# Patient Record
Sex: Male | Born: 1985 | Race: White | Hispanic: No | Marital: Married | Smoking: Current some day smoker
Health system: Southern US, Community
[De-identification: ages and names within clinical notes are randomized; demographics above are authoritative.]

## PROBLEM LIST (undated history)

## (undated) DIAGNOSIS — M549 Dorsalgia, unspecified: Secondary | ICD-10-CM

## (undated) DIAGNOSIS — Z95811 Presence of heart assist device: Secondary | ICD-10-CM

## (undated) DIAGNOSIS — T7840XA Allergy, unspecified, initial encounter: Secondary | ICD-10-CM

## (undated) DIAGNOSIS — F909 Attention-deficit hyperactivity disorder, unspecified type: Secondary | ICD-10-CM

## (undated) DIAGNOSIS — K219 Gastro-esophageal reflux disease without esophagitis: Secondary | ICD-10-CM

## (undated) DIAGNOSIS — R569 Unspecified convulsions: Secondary | ICD-10-CM

## (undated) HISTORY — PX: WISDOM TOOTH EXTRACTION: SHX21

## (undated) HISTORY — DX: Allergy, unspecified, initial encounter: T78.40XA

## (undated) HISTORY — PX: CHOLECYSTECTOMY: SHX55

## (undated) HISTORY — DX: Dorsalgia, unspecified: M54.9

## (undated) HISTORY — DX: Attention-deficit hyperactivity disorder, unspecified type: F90.9

## (undated) HISTORY — DX: Gastro-esophageal reflux disease without esophagitis: K21.9

---
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
## 2008-01-29 ENCOUNTER — Emergency Department (HOSPITAL_COMMUNITY): Admission: EM | Admit: 2008-01-29 | Discharge: 2008-01-29 | Payer: Self-pay | Admitting: Emergency Medicine

## 2008-02-13 ENCOUNTER — Emergency Department (HOSPITAL_COMMUNITY): Admission: EM | Admit: 2008-02-13 | Discharge: 2008-02-13 | Payer: Self-pay | Admitting: Emergency Medicine

## 2008-02-26 ENCOUNTER — Emergency Department (HOSPITAL_COMMUNITY): Admission: EM | Admit: 2008-02-26 | Discharge: 2008-02-26 | Payer: Self-pay | Admitting: Emergency Medicine

## 2008-03-19 ENCOUNTER — Emergency Department (HOSPITAL_COMMUNITY): Admission: EM | Admit: 2008-03-19 | Discharge: 2008-03-19 | Payer: Self-pay | Admitting: Emergency Medicine

## 2008-06-14 ENCOUNTER — Emergency Department (HOSPITAL_COMMUNITY): Admission: EM | Admit: 2008-06-14 | Discharge: 2008-06-14 | Payer: Self-pay | Admitting: Family Medicine

## 2008-07-29 ENCOUNTER — Emergency Department (HOSPITAL_COMMUNITY): Admission: EM | Admit: 2008-07-29 | Discharge: 2008-07-29 | Payer: Self-pay | Admitting: Emergency Medicine

## 2008-09-26 ENCOUNTER — Ambulatory Visit: Payer: Self-pay | Admitting: Family Medicine

## 2008-09-26 DIAGNOSIS — F329 Major depressive disorder, single episode, unspecified: Secondary | ICD-10-CM

## 2008-09-26 DIAGNOSIS — J45909 Unspecified asthma, uncomplicated: Secondary | ICD-10-CM | POA: Insufficient documentation

## 2008-09-30 ENCOUNTER — Encounter (INDEPENDENT_AMBULATORY_CARE_PROVIDER_SITE_OTHER): Payer: Self-pay | Admitting: Family Medicine

## 2008-09-30 LAB — CONVERTED CEMR LAB

## 2008-11-22 ENCOUNTER — Ambulatory Visit: Payer: Self-pay | Admitting: *Deleted

## 2008-11-22 ENCOUNTER — Ambulatory Visit: Payer: Self-pay | Admitting: Family Medicine

## 2008-11-22 DIAGNOSIS — M7520 Bicipital tendinitis, unspecified shoulder: Secondary | ICD-10-CM | POA: Insufficient documentation

## 2008-12-20 ENCOUNTER — Emergency Department (HOSPITAL_COMMUNITY): Admission: EM | Admit: 2008-12-20 | Discharge: 2008-12-20 | Payer: Self-pay | Admitting: Family Medicine

## 2008-12-23 HISTORY — PX: CHOLECYSTECTOMY: SHX55

## 2009-02-07 ENCOUNTER — Ambulatory Visit: Payer: Self-pay | Admitting: Family Medicine

## 2009-03-09 ENCOUNTER — Inpatient Hospital Stay (HOSPITAL_COMMUNITY): Admission: EM | Admit: 2009-03-09 | Discharge: 2009-03-12 | Payer: Self-pay | Admitting: Emergency Medicine

## 2009-03-09 ENCOUNTER — Ambulatory Visit: Payer: Self-pay | Admitting: Gastroenterology

## 2009-03-11 ENCOUNTER — Encounter (INDEPENDENT_AMBULATORY_CARE_PROVIDER_SITE_OTHER): Payer: Self-pay | Admitting: General Surgery

## 2009-05-17 ENCOUNTER — Emergency Department (HOSPITAL_COMMUNITY): Admission: EM | Admit: 2009-05-17 | Discharge: 2009-05-17 | Payer: Self-pay | Admitting: Emergency Medicine

## 2009-06-01 ENCOUNTER — Emergency Department (HOSPITAL_COMMUNITY): Admission: EM | Admit: 2009-06-01 | Discharge: 2009-06-01 | Payer: Self-pay | Admitting: Emergency Medicine

## 2009-07-25 ENCOUNTER — Emergency Department (HOSPITAL_COMMUNITY): Admission: EM | Admit: 2009-07-25 | Discharge: 2009-07-25 | Payer: Self-pay | Admitting: Emergency Medicine

## 2009-09-01 ENCOUNTER — Emergency Department (HOSPITAL_COMMUNITY): Admission: EM | Admit: 2009-09-01 | Discharge: 2009-09-01 | Payer: Self-pay | Admitting: Emergency Medicine

## 2009-09-21 ENCOUNTER — Emergency Department (HOSPITAL_COMMUNITY): Admission: EM | Admit: 2009-09-21 | Discharge: 2009-09-21 | Payer: Self-pay | Admitting: Emergency Medicine

## 2009-10-03 ENCOUNTER — Emergency Department (HOSPITAL_COMMUNITY): Admission: EM | Admit: 2009-10-03 | Discharge: 2009-10-03 | Payer: Self-pay | Admitting: Family Medicine

## 2009-10-04 ENCOUNTER — Ambulatory Visit: Payer: Self-pay | Admitting: Physician Assistant

## 2009-10-04 LAB — CONVERTED CEMR LAB
AST: 12 units/L (ref 0–37)
Albumin: 4.9 g/dL (ref 3.5–5.2)
Alkaline Phosphatase: 71 units/L (ref 39–117)
Barbiturate Quant, Ur: NEGATIVE
Calcium: 9.1 mg/dL (ref 8.4–10.5)
Chloride: 102 meq/L (ref 96–112)
Creatinine,U: 210.3 mg/dL
Marijuana Metabolite: POSITIVE — AB
Methadone: NEGATIVE
Opiate Screen, Urine: NEGATIVE
Potassium: 4.2 meq/L (ref 3.5–5.3)
Propoxyphene: NEGATIVE
Sodium: 140 meq/L (ref 135–145)
Total Protein: 7 g/dL (ref 6.0–8.3)

## 2009-10-05 ENCOUNTER — Encounter: Payer: Self-pay | Admitting: Physician Assistant

## 2009-10-06 ENCOUNTER — Encounter (INDEPENDENT_AMBULATORY_CARE_PROVIDER_SITE_OTHER): Payer: Self-pay | Admitting: *Deleted

## 2009-10-13 ENCOUNTER — Telehealth: Payer: Self-pay | Admitting: Physician Assistant

## 2009-10-18 ENCOUNTER — Ambulatory Visit (HOSPITAL_COMMUNITY): Admission: RE | Admit: 2009-10-18 | Discharge: 2009-10-18 | Payer: Self-pay | Admitting: Physician Assistant

## 2009-10-24 ENCOUNTER — Telehealth: Payer: Self-pay | Admitting: Physician Assistant

## 2009-10-30 ENCOUNTER — Encounter: Payer: Self-pay | Admitting: Physician Assistant

## 2009-11-06 ENCOUNTER — Ambulatory Visit: Payer: Self-pay | Admitting: Physician Assistant

## 2009-11-06 ENCOUNTER — Encounter (INDEPENDENT_AMBULATORY_CARE_PROVIDER_SITE_OTHER): Payer: Self-pay | Admitting: *Deleted

## 2009-11-06 DIAGNOSIS — F411 Generalized anxiety disorder: Secondary | ICD-10-CM | POA: Insufficient documentation

## 2009-11-27 ENCOUNTER — Ambulatory Visit: Payer: Self-pay | Admitting: Physician Assistant

## 2009-11-27 ENCOUNTER — Telehealth: Payer: Self-pay | Admitting: Physician Assistant

## 2010-01-04 ENCOUNTER — Emergency Department (HOSPITAL_COMMUNITY): Admission: EM | Admit: 2010-01-04 | Discharge: 2010-01-04 | Payer: Self-pay | Admitting: Emergency Medicine

## 2010-04-25 ENCOUNTER — Emergency Department (HOSPITAL_COMMUNITY): Admission: EM | Admit: 2010-04-25 | Discharge: 2010-04-25 | Payer: Self-pay | Admitting: Emergency Medicine

## 2010-07-12 ENCOUNTER — Emergency Department (HOSPITAL_COMMUNITY): Admission: EM | Admit: 2010-07-12 | Discharge: 2010-07-12 | Payer: Self-pay | Admitting: Emergency Medicine

## 2010-08-22 ENCOUNTER — Emergency Department (HOSPITAL_COMMUNITY): Admission: EM | Admit: 2010-08-22 | Discharge: 2010-08-22 | Payer: Self-pay | Admitting: Family Medicine

## 2010-08-31 ENCOUNTER — Ambulatory Visit: Payer: Self-pay | Admitting: Internal Medicine

## 2010-08-31 ENCOUNTER — Encounter: Payer: Self-pay | Admitting: Physician Assistant

## 2010-08-31 ENCOUNTER — Telehealth: Payer: Self-pay | Admitting: Physician Assistant

## 2010-09-14 ENCOUNTER — Telehealth: Payer: Self-pay | Admitting: Physician Assistant

## 2010-09-14 DIAGNOSIS — R569 Unspecified convulsions: Secondary | ICD-10-CM | POA: Insufficient documentation

## 2010-09-18 ENCOUNTER — Encounter: Payer: Self-pay | Admitting: Physician Assistant

## 2010-09-28 ENCOUNTER — Telehealth: Payer: Self-pay | Admitting: Physician Assistant

## 2010-09-29 ENCOUNTER — Emergency Department (HOSPITAL_COMMUNITY): Admission: EM | Admit: 2010-09-29 | Discharge: 2010-09-29 | Payer: Self-pay | Admitting: Emergency Medicine

## 2010-10-12 ENCOUNTER — Encounter: Payer: Self-pay | Admitting: Physician Assistant

## 2010-12-11 ENCOUNTER — Encounter: Payer: Self-pay | Admitting: Physician Assistant

## 2011-01-13 ENCOUNTER — Encounter: Payer: Self-pay | Admitting: Internal Medicine

## 2011-01-23 ENCOUNTER — Encounter (INDEPENDENT_AMBULATORY_CARE_PROVIDER_SITE_OTHER): Payer: Self-pay | Admitting: Nurse Practitioner

## 2011-01-24 ENCOUNTER — Encounter (INDEPENDENT_AMBULATORY_CARE_PROVIDER_SITE_OTHER): Payer: Self-pay | Admitting: Nurse Practitioner

## 2011-01-24 ENCOUNTER — Encounter: Payer: Self-pay | Admitting: Nurse Practitioner

## 2011-01-24 DIAGNOSIS — F988 Other specified behavioral and emotional disorders with onset usually occurring in childhood and adolescence: Secondary | ICD-10-CM | POA: Insufficient documentation

## 2011-01-24 LAB — CONVERTED CEMR LAB
ALT: 9 units/L (ref 0–53)
AST: 14 units/L (ref 0–37)
Basophils Absolute: 0 10*3/uL (ref 0.0–0.1)
Basophils Relative: 0 % (ref 0–1)
Calcium: 9.7 mg/dL (ref 8.4–10.5)
Chloride: 102 meq/L (ref 96–112)
Creatinine, Ser: 0.83 mg/dL (ref 0.40–1.50)
MCHC: 34.1 g/dL (ref 30.0–36.0)
Monocytes Absolute: 0.5 10*3/uL (ref 0.1–1.0)
Neutro Abs: 4.8 10*3/uL (ref 1.7–7.7)
Neutrophils Relative %: 62 % (ref 43–77)
Platelets: 275 10*3/uL (ref 150–400)
RDW: 12.7 % (ref 11.5–15.5)
Sodium: 140 meq/L (ref 135–145)
Total Protein: 7.8 g/dL (ref 6.0–8.3)

## 2011-01-24 NOTE — Progress Notes (Signed)
Summary: PT referral  Phone Note Outgoing Call   Summary of Call: Refer to PT for biceps tendinitis. Initial call taken by: Tereso Newcomer PA-C,  August 31, 2010 11:55 AM  Follow-up for Phone Call        PT HAVE AN APPT 09-18-10 @ 11:45 LVM TO PT TO CALL ME BACK FOR THE APPT  Follow-up by: Cheryll Dessert,  September 04, 2010 4:51 PM

## 2011-01-24 NOTE — Letter (Signed)
Summary: [facility] FO MEDICINE//NEURPOLOGY  [facility] FO MEDICINE//NEURPOLOGY   Imported By: Arta Bruce 09/18/2010 11:43:11  _____________________________________________________________________  External Attachment:    Type:   Image     Comment:   External Document

## 2011-01-24 NOTE — Assessment & Plan Note (Signed)
Summary: ASTHMA, SEIZURE MED REFILL/LR   Vital Signs:  Patient profile:   25 year old male Height:      66.75 inches Weight:      124 pounds BMI:     19.64 Temp:     97.6 degrees F oral Pulse rate:   72 / minute Pulse rhythm:   regular Resp:     18 per minute BP sitting:   140 / 92  (left arm) Cuff size:   regular  Vitals Entered By: CMA Student Linzie Collin CC: F/U asthma and seizure, left shoulder aching onset last two month, previous collar bone injury, needs refills on medications, medications verified Is Patient Diabetic? No Pain Assessment Patient in pain? yes     Location: left shoulder Intensity: 5 Type: sharp/ aching  Does patient need assistance? Functional Status Self care Ambulation Normal Comments Peak Flow results..  1. 430     2.  490      3.  490   CC:  F/U asthma and seizure, left shoulder aching onset last two month, previous collar bone injury, needs refills on medications, and medications verified.  History of Present Illness: Seizures:  Went to [facility].  Saw Neuro and had an EEG.  Never heard anything.  I have not rec'd anything.  He has taken his meds and had no seizures.  Asthma: OUt of Advair for a couple months.  Much better controlled with Advair.    Depression:  Took himself off of Zoloft.  Feels good.  Deneis depression.  L shoulder:  Has a h/o clavicle fx age 42.  Injured shoulder about a year ago.  Over last couple months has had anterior shoulder pain.  Worse with repetetive motion.  Has taken Ibuprofen 800 without relief.  Cannot do benchpress or sweep without significant pain.    Asthma History    Asthma Control Assessment:    Age range: 12+ years    Symptoms: >2 days/week    Nighttime Awakenings: 0-2/month    Interferes w/ normal activity: some limitations    SABA use (not for EIB): several times per day    Asthma Control Assessment: Very Poorly Controlled   Problems Prior to Update: 1)  Anxiety State, Unspecified   (ICD-300.00) 2)  Seizure Disorder  (ICD-780.39) 3)  Biceps Tendinitis, Left  (ICD-726.12) 4)  Depression  (ICD-311) 5)  Asthma  (ICD-493.90)  Current Medications (verified): 1)  Advair Diskus 100-50 Mcg/dose Misc (Fluticasone-Salmeterol) .Marland Kitchen.. 1 Puff 2 Times Daily 2)  Ventolin Hfa 108 (90 Base) Mcg/act Aers (Albuterol Sulfate) .... Use 2 Puffs Every 4-6 Hours As Needed 3)  Zoloft 100 Mg Tabs (Sertraline Hcl) .... Take 2 Tablets Once Daily (Dose Increased) 4)  Ibuprofen 800 Mg Tabs (Ibuprofen) .... Take One Tablet Po Every 6-8 Hours As Needed Pain 5)  Keppra 500 Mg Tabs (Levetiracetam) .Marland Kitchen.. 1 By Mouth Two Times A Day 6)  Buspar 5 Mg Tabs (Buspirone Hcl) .... Take 1 1/2 Tabs By Mouth Two Times A Day  Allergies (verified): No Known Drug Allergies  Past History:  Past Medical History: Last updated: 11/06/2009 Asthma since childhood Depression...problem since age 58...on zoloft x 1 year. Was seeing Dr.mark Beck/[facility] Seizure disorder     a.  MRI 09/2009 normal; EEG and referral to neuro pending  Physical Exam  General:  alert, well-developed, and well-nourished.   Head:  normocephalic and atraumatic.   Lungs:  normal breath sounds, no crackles, and no wheezes.   Heart:  normal rate and regular rhythm.   Msk:  L shoulder: + pain over bicipital groove and with resistance to supination Full ROM empty can test neg  Neurologic:  alert & oriented X3 and cranial nerves II-XII intact.   Psych:  normally interactive.     Impression & Recommendations:  Problem # 1:  BICEPS TENDINITIS, LEFT (ICD-726.12)  rest  ice change nsaids send to PT if no improvement send to [facility] clinic for poss injection  Orders: Physical Therapy Referral (PT)  Problem # 2:  SEIZURE DISORDER (ICD-780.39) stable  His updated medication list for this problem includes:    Keppra 500 Mg Tabs (Levetiracetam) .Marland Kitchen... 1 by mouth two times a day  Problem # 3:  ASTHMA  (ICD-493.90) Assessment: Deteriorated needs Advair refilled does much better with Advair  His updated medication list for this problem includes:    Advair Diskus 100-50 Mcg/dose Misc (Fluticasone-salmeterol) .Marland Kitchen... 1 puff 2 times daily    Ventolin Hfa 108 (90 Base) Mcg/act Aers (Albuterol sulfate) ..... Use 2 puffs every 4-6 hours as needed  Complete Medication List: 1)  Advair Diskus 100-50 Mcg/dose Misc (Fluticasone-salmeterol) .Marland Kitchen.. 1 puff 2 times daily 2)  Ventolin Hfa 108 (90 Base) Mcg/act Aers (Albuterol sulfate) .... Use 2 puffs every 4-6 hours as needed 3)  Keppra 500 Mg Tabs (Levetiracetam) .Marland Kitchen.. 1 by mouth two times a day 4)  Diclofenac Sodium 75 Mg Tbec (Diclofenac sodium) .... Take 1 tablet by mouth two times a day as needed for pain  Patient Instructions: 1)  No heavy lifting, pushing, pulling, weight lifting, etc for 2 weeks. 2)  Apply ice to shoulder two times a day to three times a day for 1-2 weeks. 3)  Use the Diclofenac two times a day every day for 5-7 days, then use two times a day as needed.  Take with food. 4)  Someone will call you to send you to physical therapy. 5)  If your pain does not get better after going through physical therapy, call me so we can send you for an injection. 6)  Get records for [facility] regarding EEG and Neurology appt. 7)  Please schedule a follow-up appointment in 6 months with Scott for seizures and asthma.  Prescriptions: KEPPRA 500 MG TABS (LEVETIRACETAM) 1 by mouth two times a day  #60 x 6   Entered and Authorized by:   Tereso Newcomer PA-C   Signed by:   Tereso Newcomer PA-C on 08/31/2010   Method used:   Print then Give to Patient   RxID:   [id number] VENTOLIN HFA 108 (90 BASE) MCG/ACT AERS (ALBUTEROL SULFATE) Use 2 puffs every 4-6 hours as needed  #1 x 11   Entered and Authorized by:   Tereso Newcomer PA-C   Signed by:   Tereso Newcomer PA-C on 08/31/2010   Method used:   Print then Give to Patient   RxID:   [id number] ADVAIR  DISKUS 100-50 MCG/DOSE MISC (FLUTICASONE-SALMETEROL) 1 puff 2 times daily  #1 x 11   Entered and Authorized by:   Tereso Newcomer PA-C   Signed by:   Tereso Newcomer PA-C on 08/31/2010   Method used:   Print then Give to Patient   RxID:   [id number] DICLOFENAC SODIUM 75 MG TBEC (DICLOFENAC SODIUM) Take 1 tablet by mouth two times a day as needed for pain  #60 x 1   Entered and Authorized by:   Tereso Newcomer PA-C   Signed by:   Tereso Newcomer PA-C on 08/31/2010  Method used:   Print then Give to Patient   RxID:   [id number]

## 2011-01-24 NOTE — Letter (Signed)
Summary: REQUESTING RECORDS FROM [facility]  REQUESTING RECORDS FROM [facility]   Imported By: Arta Bruce 09/21/2010 16:00:27  _____________________________________________________________________  External Attachment:    Type:   Image     Comment:   External Document

## 2011-01-24 NOTE — Letter (Signed)
Summary: PT REQUESTING RECORDS FOR SELF//PICKED UP  PT REQUESTING RECORDS FOR SELF//PICKED UP   Imported By: Arta Bruce 12/11/2010 14:15:16  _____________________________________________________________________  External Attachment:    Type:   Image     Comment:   External Document

## 2011-01-24 NOTE — Progress Notes (Signed)
Summary: Refill  Phone Note Call from Patient Call back at [phone]   Summary of Call: Wants to change his pharmacy to [facility], [address]  -- wants refill of Lipitor. Initial call taken by: Domenic Polite  September 28, 2010  10:45 AM  Follow-up for Phone Call        Left message on voicemail for pt. to return call.  Dutch Quint RN  September 28, 2010 11:07 AM   He is not on Lipitor. Tereso Newcomer PA-C  September 28, 2010 4:36 PM  Left message on answering machine for pt to call back.Marland KitchenMarland KitchenMarland KitchenArmenia Shannon  October 01, 2010 4:00 PM   Additional Follow-up for Phone Call Additional follow up Details #1::        States he had needed his inhaler refilled, but couldn't wait four days for [place] to refill. Went to urgent care and had it taken care of.  No longer needs any refills. Additional Follow-up by: Dutch Quint RN,  October 02, 2010 3:25 PM

## 2011-01-24 NOTE — Progress Notes (Signed)
Summary: Seizures  Phone Note Outgoing Call   Summary of Call: Rec'd records from [facility]. EEG was normal. Records indicate he was to f/u in a month.  If he never followed up, have him schedule a f/u appt.  Initial call taken by: Brynda Rim,  September 14, 2010 2:09 PM  Follow-up for Phone Call        pt is scheduled Follow-up by: Armenia Shannon,  September 17, 2010 9:13 AM  New Problems: SEIZURE DISORDER (ICD-780.39)   New Problems: SEIZURE DISORDER (ICD-780.39)    Past History:  Past Medical History: Asthma since childhood Depression...problem since age 45...on zoloft x 1 year. Was seeing Dr.mark Beck/[place] [facility] Seizure disorder     a.  MRI 09/2009 normal; EEG and referral to neuro pending     b.  eval at [facility] 1.2011; EEG normal; was to have 1 mo. follow up

## 2011-01-25 ENCOUNTER — Encounter (INDEPENDENT_AMBULATORY_CARE_PROVIDER_SITE_OTHER): Payer: Self-pay | Admitting: Nurse Practitioner

## 2011-01-30 NOTE — Assessment & Plan Note (Signed)
Summary: Asthma   Vital Signs:  Patient profile:   25 year old male Weight:      127.7 pounds BMI:     20.22 O2 Sat:      97 % on Room air Temp:     98.3 degrees F oral Pulse rate:   88 / minute Pulse rhythm:   regular Resp:     20 per minute BP sitting:   130 / 80  (left arm) Cuff size:   regular  Vitals Entered By: Levon Hedger (January 24, 2011 3:10 PM)  O2 Flow:  Room air  Serial Vital Signs/Assessments:  Comments: p/f  650,  650,  670 By: Levon Hedger   CC: wants to be put back on ADD meidcation for school, Depression Is Patient Diabetic? No Pain Assessment Patient in pain? yes     Location: shoulder Onset of pain  Chronic  Does patient need assistance? Functional Status Self care Ambulation Normal   CC:  wants to be put back on ADD meidcation for school and Depression.  History of Present Illness:  Pt into the office for f/u on asthma. He is complaint with his advair and takes it two times a day MDI - very infrequent use about 3 times per month  ADD- pt was originally dx in 6th grade and started on Ritalin.  He had side effects. He was then on straterra which then caused his stomach to be upset. Pt was on Adderall which he tolerated well but he lost interest in school and dropped out. He has since gone back to get his GED. He has now gone back to [employer] and is actively seeking a degree. Trouble concentrating and focusing while in school.  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  He has been at curent job for 2 years.  he did go through a period of job hoping again due to trouble focusing and getting frustrating.  He admits that she still has some ongoing trouble with concentrating at work but his routine is pretty standard.  Home - lives with mother.  He tries to overlook the decrease in concentration because he is at home.  Mother asks him to do somethings and he forgets to do those things.  Asthma History  Asthma Control Assessment:    Age range: 12+ years    Symptoms: 0-2 days/week    Nighttime Awakenings: 0-2/month    Interferes w/ normal activity: no limitations    SABA use (not for EIB): 0-2 days/week    ATAQ questionnaire: 0    Exacerbations requiring oral systemic steroids: 0-1/year    Asthma Control Assessment: Well Controlled  Depression History:      The patient denies a depressed mood most of the day and a diminished interest in his usual daily activities.  Positive alarm features for depression include impaired concentration (indecisiveness).  However, he denies recurrent thoughts of death or suicide.        The patient denies that he feels like life is not worth living, denies that he wishes that he were dead, and denies that he has thought about ending his life.          Habits & Providers  Alcohol-Tobacco-Diet     Tobacco Status: quit     Year Quit: 2008     Passive Smoke Exposure: yes  Exercise-Depression-Behavior     Does Patient Exercise: yes     Type of exercise: jogging, wts.  Times/week: 3     Drug Use: no     Seat Belt Use: 100     Sun Exposure: occasionally  Comments: PHQ-9 score = 9  Medications Prior to Update: 1)  Advair Diskus 100-50 Mcg/dose Misc (Fluticasone-Salmeterol) .Marland Kitchen.. 1 Puff 2 Times Daily 2)  Ventolin Hfa 108 (90 Base) Mcg/act Aers (Albuterol Sulfate) .... Use 2 Puffs Every 4-6 Hours As Needed 3)  Keppra 500 Mg Tabs (Levetiracetam) .Marland Kitchen.. 1 By Mouth Two Times A Day 4)  Diclofenac Sodium 75 Mg Tbec (Diclofenac Sodium) .... Take 1 Tablet By Mouth Two Times A Day As Needed For Pain  Current Medications (verified): 1)  Advair Diskus 100-50 Mcg/dose Misc (Fluticasone-Salmeterol) .Marland Kitchen.. 1 Puff 2 Times Daily 2)  Ventolin Hfa 108 (90 Base) Mcg/act Aers (Albuterol Sulfate) .... Use 2 Puffs Every 4-6 Hours As Needed 3)  Keppra 500 Mg Tabs (Levetiracetam) .Marland Kitchen.. 1 By Mouth Two Times A Day 4)  Diclofenac Sodium 75 Mg Tbec (Diclofenac Sodium) .... Take 1  Tablet By Mouth Two Times A Day As Needed For Pain  Allergies (verified): No Known Drug Allergies  Review of Systems CV:  Denies chest pain or discomfort. GI:  Denies abdominal pain. Neuro:  Complains of seizures; ? if seizures was due to max dose of zoloft - he has since quit all meds. last seizure was 2010. Psych:  Complains of depression.  Physical Exam  General:  alert.   Head:  normocephalic.   Lungs:  normal breath sounds.   Heart:  normal rate and regular rhythm.   Msk:  normal ROM.   Neurologic:  alert & oriented X3.   Skin:  color normal.   Psych:  Oriented X3.     Impression & Recommendations:  Problem # 1:  ASTHMA (ICD-493.90) stable at this time His updated medication list for this problem includes:    Advair Diskus 100-50 Mcg/dose Misc (Fluticasone-salmeterol) .Marland Kitchen... 1 puff 2 times daily    Ventolin Hfa 108 (90 Base) Mcg/act Aers (Albuterol sulfate) ..... Use 2 puffs every 4-6 hours as needed  Orders: Peak Flow Rate ([address]) Pulse Oximetry (single measurment) ([address]) T-Comprehensive Metabolic Panel (60454-09811) T-CBC w/Diff (91478-29562)  Problem # 2:  DEPRESSION (ICD-311) pt is no longer taking meds  he does not feel like he is depressed.  He thinks ADD is causing a problem Orders: T-HIV Antibody  (Reflex) (13086-57846) T-TSH (96295-28413)  Problem # 3:  SEIZURE DISORDER (ICD-780.39) ? if due to zoloft His updated medication list for this problem includes:    Keppra 500 Mg Tabs (Levetiracetam) .Marland Kitchen... 1 by mouth two times a day  Problem # 4:  ADD (ICD-314.00) will start Adderall ER  Complete Medication List: 1)  Advair Diskus 100-50 Mcg/dose Misc (Fluticasone-salmeterol) .Marland Kitchen.. 1 puff 2 times daily 2)  Ventolin Hfa 108 (90 Base) Mcg/act Aers (Albuterol sulfate) .... Use 2 puffs every 4-6 hours as needed 3)  Keppra 500 Mg Tabs (Levetiracetam) .Marland Kitchen.. 1 by mouth two times a day 4)  Diclofenac Sodium 75 Mg Tbec (Diclofenac sodium) .... Take 1 tablet by  mouth two times a day as needed for pain 5)  Adderall 10 Mg Tabs (Amphetamine-dextroamphetamine) .... One tablet by mouth two times a day for attention  Asthma Management Plan    Asthma Severity: Intermittent    Control Assessment: Well Controlled    Personal best PEF: 670 liters/minute    Predicted PEF: 605 liters/minute    Working PEF: 670 liters/minute    Plan based on PEF formula:  [employer] Zone: (Range: 540 to 670) ADVAIR DISKUS 100-50 MCG/DOSE MISC:  2 puffs every 12 hours  Yellow Zone: VENTOLIN HFA 108 (90 BASE) MCG/ACT AERS:  2 puffs every 4 hours as needed  Red Zone:   Patient Instructions: 1)  Asthma - done well. Keep taking medications as ordered 2)  ADD - Restart on adderall 10mg  by mouth two times a day  3)  You will have to pick up this prescription every month 4)  Follow up in 4 weeks for medication review - Adderall Prescriptions: ADDERALL 10 MG TABS (AMPHETAMINE-DEXTROAMPHETAMINE) One tablet by mouth two times a day for attention  #60 x 0   Entered and Authorized by:   Lehman Prom FNP   Signed by:   Lehman Prom FNP on 01/24/2011   Method used:   Print then Give to Patient   RxID:   [id number]    Orders Added: 1)  Est. Patient Level IV [14782] 2)  Peak Flow Rate [94150] 3)  Pulse Oximetry (single measurment) [94760] 4)  T-Comprehensive Metabolic Panel [80053-22900] 5)  T-CBC w/Diff [95621-30865] 6)  T-HIV Antibody  (Reflex) [[id number]] 7)  T-TSH [84132-44010]    Prevention & Chronic Care Immunizations   Influenza vaccine: Fluvax 3+  (11/22/2008)   Influenza vaccine deferral: Refused  (01/24/2011)    Tetanus booster: 12/24/2003: historical per pt    Pneumococcal vaccine: Not documented  Other Screening   Smoking status: quit  (01/24/2011)

## 2011-01-30 NOTE — Letter (Signed)
Summary: *HSN Results Follow up  [facility]  [address] [place], [place] [address]   Phone: [phone]  Fax: [phone]      01/25/2011   Joel York [address] [place], [place]  [address]   Dear  Mr. Joel York,                            ____S.Drinkard,FNP   ____D. Gore,FNP       ____B. McPherson,MD   ____V. Rankins,MD    ____E. Mulberry,MD    __X__N. Daphine Deutscher, FNP  ____D. Reche Dixon, MD    ____K. Philipp Deputy, MD    ____Other     This letter is to inform you that your recent test(s):  _______Pap Smear    ___X____Lab Test     _______X-ray    ___X____ is within acceptable limits  _______ requires a medication change  _______ requires a follow-up lab visit  _______ requires a follow-up visit with your provider   Comments: Labs done during your recent office visit are normal       _________________________________________________________ If you have any questions, please contact our office [phone].                    Sincerely,    Lehman Prom FNP [facility]

## 2011-02-07 NOTE — Progress Notes (Signed)
Summary: Office Visit//DEPRESSION SCREENING  Office Visit//DEPRESSION SCREENING   Imported By: Arta Bruce 01/28/2011 12:30:41  _____________________________________________________________________  External Attachment:    Type:   Image     Comment:   External Document

## 2011-03-29 LAB — URINALYSIS, ROUTINE W REFLEX MICROSCOPIC
Glucose, UA: NEGATIVE mg/dL
Hgb urine dipstick: NEGATIVE
Protein, ur: NEGATIVE mg/dL
Specific Gravity, Urine: 1.018 (ref 1.005–1.030)

## 2011-03-29 LAB — POCT I-STAT, CHEM 8
Chloride: 106 mEq/L (ref 96–112)
HCT: 49 % (ref 39.0–52.0)
Potassium: 3.9 mEq/L (ref 3.5–5.1)

## 2011-03-29 LAB — URINE CULTURE: Culture: NO GROWTH

## 2011-03-29 LAB — DIFFERENTIAL
Basophils Absolute: 0.1 10*3/uL (ref 0.0–0.1)
Lymphocytes Relative: 19 % (ref 12–46)
Neutro Abs: 6 10*3/uL (ref 1.7–7.7)

## 2011-03-29 LAB — CBC
Platelets: 278 10*3/uL (ref 150–400)
RDW: 12.5 % (ref 11.5–15.5)

## 2011-03-29 LAB — CARBAMAZEPINE LEVEL, TOTAL: Carbamazepine Lvl: <2 ug/mL — ABNORMAL LOW (ref 4.0–12.0)

## 2011-03-30 LAB — RAPID URINE DRUG SCREEN, HOSP PERFORMED
Barbiturates: NOT DETECTED
Benzodiazepines: NOT DETECTED
Cocaine: NOT DETECTED

## 2011-03-30 LAB — POCT I-STAT, CHEM 8
Creatinine, Ser: 1.2 mg/dL (ref 0.4–1.5)
Glucose, Bld: 90 mg/dL (ref 70–99)
Hemoglobin: 17 g/dL (ref 13.0–17.0)
Potassium: 3.2 mEq/L — ABNORMAL LOW (ref 3.5–5.1)
TCO2: 22 mmol/L (ref 0–100)

## 2011-04-01 LAB — ETHANOL: Alcohol, Ethyl (B): <5 mg/dL (ref 0–10)

## 2011-04-01 LAB — BASIC METABOLIC PANEL
Calcium: 9.3 mg/dL (ref 8.4–10.5)
GFR calc Af Amer: >60 mL/min (ref >60)
GFR calc non Af Amer: >60 mL/min (ref >60)
Glucose, Bld: 99 mg/dL (ref 70–99)
Sodium: 138 mEq/L (ref 135–145)

## 2011-04-01 LAB — RAPID URINE DRUG SCREEN, HOSP PERFORMED
Amphetamines: NOT DETECTED
Cocaine: NOT DETECTED
Tetrahydrocannabinol: POSITIVE — AB

## 2011-04-01 LAB — DIFFERENTIAL
Basophils Absolute: 0 10*3/uL (ref 0.0–0.1)
Lymphocytes Relative: 28 % (ref 12–46)
Monocytes Absolute: 0.6 10*3/uL (ref 0.1–1.0)
Neutro Abs: 6.1 10*3/uL (ref 1.7–7.7)
Neutrophils Relative %: 62 % (ref 43–77)

## 2011-04-01 LAB — CBC
Hemoglobin: 17.4 g/dL — ABNORMAL HIGH (ref 13.0–17.0)
RDW: 13.5 % (ref 11.5–15.5)

## 2011-04-04 LAB — URINALYSIS, ROUTINE W REFLEX MICROSCOPIC
Hgb urine dipstick: NEGATIVE
Ketones, ur: 15 mg/dL — AB
Protein, ur: NEGATIVE mg/dL
Urobilinogen, UA: 0.2 mg/dL (ref 0.0–1.0)

## 2011-04-04 LAB — COMPREHENSIVE METABOLIC PANEL
ALT: 103 U/L — ABNORMAL HIGH (ref 0–53)
ALT: 51 U/L (ref 0–53)
ALT: 64 U/L — ABNORMAL HIGH (ref 0–53)
AST: 378 U/L — ABNORMAL HIGH (ref 0–37)
AST: 62 U/L — ABNORMAL HIGH (ref 0–37)
Albumin: 3.2 g/dL — ABNORMAL LOW (ref 3.5–5.2)
Albumin: 3.4 g/dL — ABNORMAL LOW (ref 3.5–5.2)
Albumin: 4.4 g/dL (ref 3.5–5.2)
Alkaline Phosphatase: 80 U/L (ref 39–117)
BUN: 11 mg/dL (ref 6–23)
BUN: 5 mg/dL — ABNORMAL LOW (ref 6–23)
BUN: 7 mg/dL (ref 6–23)
CO2: 23 mEq/L (ref 19–32)
CO2: 28 mEq/L (ref 19–32)
Calcium: 8.5 mg/dL (ref 8.4–10.5)
Chloride: 100 mEq/L (ref 96–112)
Chloride: 103 mEq/L (ref 96–112)
Chloride: 105 mEq/L (ref 96–112)
Creatinine, Ser: 0.86 mg/dL (ref 0.4–1.5)
Creatinine, Ser: 0.97 mg/dL (ref 0.4–1.5)
GFR calc Af Amer: >60 mL/min (ref >60)
GFR calc Af Amer: >60 mL/min (ref >60)
GFR calc non Af Amer: >60 mL/min (ref >60)
Glucose, Bld: 89 mg/dL (ref 70–99)
Glucose, Bld: 94 mg/dL (ref 70–99)
Potassium: 3.2 mEq/L — ABNORMAL LOW (ref 3.5–5.1)
Potassium: 3.5 mEq/L (ref 3.5–5.1)
Potassium: 4 mEq/L (ref 3.5–5.1)
Sodium: 138 mEq/L (ref 135–145)
Sodium: 139 mEq/L (ref 135–145)
Total Bilirubin: 1.2 mg/dL (ref 0.3–1.2)
Total Bilirubin: 2 mg/dL — ABNORMAL HIGH (ref 0.3–1.2)
Total Bilirubin: 3 mg/dL — ABNORMAL HIGH (ref 0.3–1.2)
Total Protein: 5.3 g/dL — ABNORMAL LOW (ref 6.0–8.3)
Total Protein: 7.2 g/dL (ref 6.0–8.3)

## 2011-04-04 LAB — MAGNESIUM: Magnesium: 2.4 mg/dL (ref 1.5–2.5)

## 2011-04-04 LAB — CBC
HCT: 44.2 % (ref 39.0–52.0)
Hemoglobin: 13.5 g/dL (ref 13.0–17.0)
Hemoglobin: 15.5 g/dL (ref 13.0–17.0)
MCHC: 34.3 g/dL (ref 30.0–36.0)
MCV: 91.3 fL (ref 78.0–100.0)
Platelets: 210 10*3/uL (ref 150–400)
Platelets: 217 10*3/uL (ref 150–400)
Platelets: 267 10*3/uL (ref 150–400)
RBC: 4.13 MIL/uL — ABNORMAL LOW (ref 4.22–5.81)
RBC: 4.81 MIL/uL (ref 4.22–5.81)
RDW: 13.5 % (ref 11.5–15.5)
RDW: 14 % (ref 11.5–15.5)
WBC: 8.2 10*3/uL (ref 4.0–10.5)
WBC: 8.2 10*3/uL (ref 4.0–10.5)
WBC: 9 10*3/uL (ref 4.0–10.5)

## 2011-04-04 LAB — DIFFERENTIAL
Basophils Absolute: 0 10*3/uL (ref 0.0–0.1)
Basophils Relative: 0 % (ref 0–1)
Eosinophils Absolute: 0.1 10*3/uL (ref 0.0–0.7)
Neutro Abs: 6.8 10*3/uL (ref 1.7–7.7)
Neutrophils Relative %: 82 % — ABNORMAL HIGH (ref 43–77)

## 2011-04-04 LAB — PROTIME-INR
INR: 1 (ref 0.00–1.49)
Prothrombin Time: 13.9 seconds (ref 11.6–15.2)

## 2011-04-04 LAB — POTASSIUM
Potassium: 4.3 mEq/L (ref 3.5–5.1)
Potassium: 4.9 mEq/L (ref 3.5–5.1)

## 2011-04-04 LAB — LIPASE, BLOOD: Lipase: 19 U/L (ref 11–59)

## 2011-04-30 ENCOUNTER — Inpatient Hospital Stay (INDEPENDENT_AMBULATORY_CARE_PROVIDER_SITE_OTHER)
Admission: RE | Admit: 2011-04-30 | Discharge: 2011-04-30 | Disposition: A | Discharge status: Home / Self Care | Payer: Self-pay | Source: Ambulatory Visit | Attending: Family Medicine | Admitting: Family Medicine

## 2011-04-30 DIAGNOSIS — J019 Acute sinusitis, unspecified: Secondary | ICD-10-CM

## 2011-05-07 NOTE — H&P (Signed)
Joel York, Joel York NO.:  [id number]   MEDICAL RECORD NO.:  [id number]          PATIENT TYPE:  INP   LOCATION:                               FACILITY:  [facility]   PHYSICIAN:  Vania Rea, M.D. DATE OF BIRTH:  Oct 24, 1986   DATE OF ADMISSION:  03/09/2009  DATE OF DISCHARGE:                              HISTORY & PHYSICAL   PRIMARY CARE PHYSICIAN:  Turkey R. Rankins, M.D.   CHIEF COMPLAINT:  Abdominal pain since 3:00 yesterday afternoon.   HISTORY OF PRESENT ILLNESS:  This is a 25 year old Caucasian student  whose only significant past medical history is asthma with infrequent  exacerbations, was in a good state of health until about 3:00 yesterday  afternoon, that is about 9 hours ago.  He started experiencing  progressively worse epigastric and right lower quadrant abdominal pain.  The pain is steady, associated with nausea but no vomiting.  He has had  no fever, no diarrhea.  He has had no weight loss, he has had no  previous history of similar symptoms.  The patient came to the emergency  room.  He has had CT scan of the abdomen with contrast and abdominal  ultrasound, and the hospitalist service was called to assist with  management.   PAST MEDICAL HISTORY:  1. Asthma.  2. Bursitis of the left shoulder.   MEDICATIONS:  1. Albuterol MDI p.r.n., rarely uses.  2. Advair 20/50 one puff twice daily.  3. Ibuprofen p.r.n. for shoulder pain.   ALLERGIES:  No known drug allergies.   SOCIAL HISTORY:  Denies tobacco or alcohol.  Is a drug user.  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   FAMILY HISTORY:  Significant only for mother with hypertension.   REVIEW OF SYSTEMS:  Other than noted above, a 10-point review of systems  is unremarkable.   EXAMINATION:  Healthy-looking young Caucasian gentleman lying in a  stretcher, obviously distressed by pain, but has a cheerful countenance.  VITALS:  His  temperature is 97.9, pulse 87, respirations 20, blood  pressure 131/75.  He is saturating at 99% on room air.  Pupils are round  and equal.  Mucous membranes pink and anicteric.  He is mildly  dehydrated.  No cervical lymphadenopathy or thyromegaly or jugular  venous distention.  CHEST:  Clear to auscultation bilaterally.  CARDIOVASCULAR SYSTEM:  Regular rhythm without murmur.  ABDOMEN:  Scaphoid and he has voluntary guarding.  He is tender in the  epigastrium and markedly tender in the right lower quadrant.  He had  normal abdominal bowel sounds.  EXTREMITIES:  Without edema, he has 3+ pulses bilaterally and they are  equal.  CENTRAL NERVOUS SYSTEM:  Cranial nerves II-XII are grossly intact and he  has no focal neurologic deficits.   LABORATORIES:  His WBC is unremarkable with a white count of 8.2, he has  82% neutrophils, his absolute granulocyte is normal at 6.8.  Complete  metabolic panel is significant for a normal BUN and creatinine of  11/0.95.  Abnormal liver function tests  with AST elevated at 133, ALT  elevated at 58, normal alk phos of 80, bilirubin elevated at 2.0, is  otherwise unremarkable.  CT scan of the abdomen and pelvis with contrast  shows markedly thickened gallbladder without definite gallstones, marked  periportal edema.  The appendix is normal.  Abdominal ultrasound reveals  markedly thickened gallbladder wall measuring 10-11 -mm, no gallstones  identified.  Biliary tract obstruction not excluded, given that the  common bile duct measures up to 9 mm focally near the porta hepatis.  Slight prominence of the portal triads.  The liver suggests the presence  of periportal edema.   ASSESSMENT:  1. Acalculous cholecystitis.  2. Probably biliary obstruction versus hepatitis.  Plan:  Will admit      this gentleman for IV fluid hydration, keep him n.p.o. and consult      GI services for probable ERCP later today.  3. History of asthma which is stable.  4. Other plans  as per orders.      Vania Rea, M.D.  Electronically Signed     LC/MEDQ  D:  03/09/2009  T:  03/09/2009  Job:  [id number]   cc:   Fanny Dance. Rankins, M.D.

## 2011-05-07 NOTE — Discharge Summary (Signed)
Joel York, Joel York                ACCOUNT NO.:  [id number]   MEDICAL RECORD NO.:  [id number]          PATIENT TYPE:  INP   LOCATION:  5126                         FACILITY:  [facility]   PHYSICIAN:  Hind I Elsaid, MD      DATE OF BIRTH:  June 15, 1998   DATE OF ADMISSION:  03/08/2009  DATE OF DISCHARGE:  03/12/2009                               DISCHARGE SUMMARY   DISCHARGE DIAGNOSES:  1. Acalculous cholecystitis status post laparoscopic cholecystectomy.  2. Abnormal liver function test with elevated total bilirubin,      resolved.  3. History of asthma.  4. Transaminitis, resolved.   DISCHARGE MEDICATIONS:  Vicodin 1-2 tables p.o. q.4-6 h. as needed.   HISTORY OF PRESENT ILLNESS:  This is a 25 year old Caucasian male with  history of asthma, admitted with progressive worsening epigastric right  lower quadrant abdominal pain.  He had CT scan of the abdomen with  contrast, which showed mild thickened gallbladder wall without definite  gallstones, finding could be due to acalculous cholecystitis.  Mildly  portal edema could be seen with acalculous cholecystitis, hepatitis,   Ultrasound of the abdomen, which showed mild thickening gallbladder, no  gallstones are identified including acalculous cholecystitis or change  related to hepatitis.  Biliary tract obstruction was not excluded even  though common bile duct measured 9 mm.   The patient underwent ERCP.  Normal caliber common bile duct and  intrahepatic radical.  No definitive common bile duct stone seen.   Cholecystectomy with cholangiogram without evidence of retained calculi  or extravasation.   CONSULTATIONS:  1. GI consulted.  2. Surgery consulted.   HOSPITAL COURSE:  The patient admitted with abdominal pain.  CT and  ultrasound suggest acalculous cholecystitis.  The patient has  significant LFTs elevation, possibility of choledocholithiasis was  considered.  The patient underwent ERCP, which was negative for any  stone.   Surgery was consulted where LAP CHOLE was done.  The patient  tolerated very well.  The patient's LFTs normalized and we felt the  patient is medically stable to be discharged home.  Follow up with Dr.  Johna Sheriff in 2-3 weeks.      Hind Bosie Helper, MD  Electronically Signed    HIE/MEDQ  D:  03/12/2009  T:  03/13/2009  Job:  [id number]
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
## 2011-05-07 NOTE — Op Note (Signed)
[name], [name]                ACCOUNT NO.:  [id number]   MEDICAL RECORD NO.:  [id number]          PATIENT TYPE:  INP   LOCATION:                               FACILITY:  [facility]   PHYSICIAN:  Sharlet Salina T. Hoxworth, M.D.DATE OF BIRTH:  27-Oct-1986   DATE OF PROCEDURE:  03/11/2009  DATE OF DISCHARGE:  03/12/2009                               OPERATIVE REPORT   PREOPERATIVE DIAGNOSIS:  Acalculous cholecystitis.   POSTOPERATIVE DIAGNOSIS:  Acalculous cholecystitis.   SURGICAL PROCEDURE:  Laparoscopic cholecystectomy with intraoperative  cholangiogram.   SURGEON:  Sharlet Salina T. Hoxworth, MD   ANESTHESIA:  General.   BRIEF HISTORY:  Joel York is a 25 year old male who presents with  acute epigastric and right upper quadrant pain, nausea, and vomiting,  which has been persistent for several days.  Workup includes a CT scan  and ultrasound both significant only for significant edema and  thickening of the gallbladder wall with no stones.  He did have some  transient elevated LFTs and has undergone ERCP with no stones found.  His LFTs have normalized.  He, however, has persistent pain and  tenderness in his right upper quadrant.  We have recommended proceeding  with laparoscopic cholecystectomy for apparent acalculous cholecystitis.  The nature of procedure, indications, alternative diagnoses, risks of  bleeding, infection, bile leak, and bile duct injury were discussed and  understood.  She was now brought to the operating room for this  procedure.   DESCRIPTION OF OPERATION:  The patient was brought to the operating  room, placed in supine position on the operating table and general  endotracheal anesthesia was induced.  The abdomen was widely sterilely  prepped and draped.  Correct patient and procedure were verified.  He  was already on IV antibiotics.  Local anesthesia was used to infiltrate  the trocar sites.  Access was obtained with an open Hasson technique at  the umbilicus  and pneumoperitoneum established.  Standard trocars were  placed under direct vision.  The gallbladder was quite edematous, but  without erythema or exudate or distention.  The fundus was grasped and  elevated up over the liver and the infundibulum was retracted  inferolaterally.  Peritoneum anterior and posterior with a closed  triangle was incised and fibrofatty tissue was stripped off from next to  gallbladder toward the porta hepatis.  The gallbladder cystic duct  junction was dissected at 360 degrees and the cystic artery skeletonized  to closed triangle.  When the anatomy was clear, the cystic duct was  clipped at the gallbladder junction and an operative cholangiogram  obtained through the cystic duct.  This showed good filling of normal  common bile duct.  The intrahepatic ducts were free flowing into the  duodenum and no filling defects.  Following this, cholangiocath was  removed and the cystic duct was triply clipped proximally divided.  The  cystic artery was further clipped distally and divided.  The gallbladder  was then dissected free from its bed and removed through the umbilicus.  Complete hemostasis was assured in the gallbladder wall.  There were no  other abnormalities seen in the liver, stomach, duodenum, small bowel,  or colon.  Trocar was removed and all CO2 evacuated and the mattress sutures  secured at the umbilicus.  Skin incisions were closed with subcuticular  Monocryl and Dermabond.  Sponge, needle, and instruments were counts  were correct.  The patient was taken to the recovery in good condition.      Lorne Skeens. Hoxworth, M.D.  Electronically Signed     BTH/MEDQ  D:  03/11/2009  T:  03/12/2009  Job:  [id number]

## 2011-06-23 ENCOUNTER — Inpatient Hospital Stay (INDEPENDENT_AMBULATORY_CARE_PROVIDER_SITE_OTHER)
Admission: RE | Admit: 2011-06-23 | Discharge: 2011-06-23 | Disposition: A | Discharge status: Home / Self Care | Payer: Self-pay | Source: Ambulatory Visit | Attending: Emergency Medicine | Admitting: Emergency Medicine

## 2011-06-23 DIAGNOSIS — J019 Acute sinusitis, unspecified: Secondary | ICD-10-CM

## 2011-07-04 ENCOUNTER — Inpatient Hospital Stay (INDEPENDENT_AMBULATORY_CARE_PROVIDER_SITE_OTHER)
Admission: RE | Admit: 2011-07-04 | Discharge: 2011-07-04 | Disposition: A | Discharge status: Home / Self Care | Payer: Self-pay | Source: Ambulatory Visit | Attending: Family Medicine | Admitting: Family Medicine

## 2011-07-04 DIAGNOSIS — J019 Acute sinusitis, unspecified: Secondary | ICD-10-CM

## 2011-08-10 ENCOUNTER — Emergency Department (HOSPITAL_COMMUNITY)
Admission: EM | Admit: 2011-08-10 | Discharge: 2011-08-10 | Disposition: A | Discharge status: Home / Self Care | Payer: Self-pay | Attending: Emergency Medicine | Admitting: Emergency Medicine

## 2011-08-10 DIAGNOSIS — J45909 Unspecified asthma, uncomplicated: Secondary | ICD-10-CM | POA: Insufficient documentation

## 2011-08-10 DIAGNOSIS — J320 Chronic maxillary sinusitis: Secondary | ICD-10-CM | POA: Insufficient documentation

## 2011-08-10 DIAGNOSIS — J3489 Other specified disorders of nose and nasal sinuses: Secondary | ICD-10-CM | POA: Insufficient documentation

## 2011-08-10 DIAGNOSIS — R569 Unspecified convulsions: Secondary | ICD-10-CM | POA: Insufficient documentation

## 2011-08-14 ENCOUNTER — Emergency Department (HOSPITAL_COMMUNITY)
Admission: EM | Admit: 2011-08-14 | Discharge: 2011-08-15 | Disposition: A | Discharge status: Home / Self Care | Payer: Self-pay | Attending: Emergency Medicine | Admitting: Emergency Medicine

## 2011-08-14 ENCOUNTER — Emergency Department (HOSPITAL_COMMUNITY): Payer: Self-pay

## 2011-08-14 DIAGNOSIS — Z79899 Other long term (current) drug therapy: Secondary | ICD-10-CM | POA: Insufficient documentation

## 2011-08-14 DIAGNOSIS — J329 Chronic sinusitis, unspecified: Secondary | ICD-10-CM | POA: Insufficient documentation

## 2011-08-14 DIAGNOSIS — J3489 Other specified disorders of nose and nasal sinuses: Secondary | ICD-10-CM | POA: Insufficient documentation

## 2011-08-14 DIAGNOSIS — G40909 Epilepsy, unspecified, not intractable, without status epilepticus: Secondary | ICD-10-CM | POA: Insufficient documentation

## 2011-08-14 DIAGNOSIS — J45909 Unspecified asthma, uncomplicated: Secondary | ICD-10-CM | POA: Insufficient documentation

## 2011-09-25 ENCOUNTER — Inpatient Hospital Stay (INDEPENDENT_AMBULATORY_CARE_PROVIDER_SITE_OTHER)
Admission: RE | Admit: 2011-09-25 | Discharge: 2011-09-25 | Disposition: A | Discharge status: Home / Self Care | Payer: Self-pay | Source: Ambulatory Visit | Attending: Emergency Medicine | Admitting: Emergency Medicine

## 2011-09-25 DIAGNOSIS — J019 Acute sinusitis, unspecified: Secondary | ICD-10-CM

## 2011-11-29 ENCOUNTER — Emergency Department (HOSPITAL_COMMUNITY)
Admission: EM | Admit: 2011-11-29 | Discharge: 2011-11-29 | Disposition: A | Discharge status: Home / Self Care | Payer: No Typology Code available for payment source | Attending: Emergency Medicine | Admitting: Emergency Medicine

## 2011-11-29 ENCOUNTER — Emergency Department (HOSPITAL_COMMUNITY): Payer: No Typology Code available for payment source

## 2011-11-29 ENCOUNTER — Encounter: Payer: Self-pay | Admitting: Emergency Medicine

## 2011-11-29 DIAGNOSIS — T148XXA Other injury of unspecified body region, initial encounter: Secondary | ICD-10-CM | POA: Insufficient documentation

## 2011-11-29 DIAGNOSIS — M25519 Pain in unspecified shoulder: Secondary | ICD-10-CM | POA: Insufficient documentation

## 2011-11-29 DIAGNOSIS — R079 Chest pain, unspecified: Secondary | ICD-10-CM | POA: Insufficient documentation

## 2011-11-29 DIAGNOSIS — Y92410 Unspecified street and highway as the place of occurrence of the external cause: Secondary | ICD-10-CM | POA: Insufficient documentation

## 2011-11-29 DIAGNOSIS — M542 Cervicalgia: Secondary | ICD-10-CM | POA: Insufficient documentation

## 2011-11-29 HISTORY — DX: Unspecified convulsions: R56.9

## 2011-11-29 MED ORDER — CYCLOBENZAPRINE HCL 10 MG PO TABS: 10.0000 mg | ORAL_TABLET | Freq: Two times a day (BID) | ORAL | Status: AC | PRN

## 2011-11-29 MED ORDER — OXYCODONE-ACETAMINOPHEN 5-325 MG PO TABS: 1.0000 | ORAL_TABLET | Freq: Four times a day (QID) | ORAL | Status: AC | PRN

## 2011-11-29 MED ORDER — OXYCODONE-ACETAMINOPHEN 5-325 MG PO TABS
1.0000 | ORAL_TABLET | Freq: Once | ORAL | Status: AC
  Administered 2011-11-29: 1 via ORAL
  Filled 2011-11-29: qty 1
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
## 2012-04-28 ENCOUNTER — Ambulatory Visit: Payer: No Typology Code available for payment source | Attending: Orthopaedic Surgery | Admitting: Rehabilitation

## 2012-04-28 DIAGNOSIS — IMO0001 Reserved for inherently not codable concepts without codable children: Secondary | ICD-10-CM | POA: Insufficient documentation

## 2012-04-28 DIAGNOSIS — M25619 Stiffness of unspecified shoulder, not elsewhere classified: Secondary | ICD-10-CM | POA: Insufficient documentation

## 2012-04-28 DIAGNOSIS — R293 Abnormal posture: Secondary | ICD-10-CM | POA: Insufficient documentation

## 2012-04-28 DIAGNOSIS — M25519 Pain in unspecified shoulder: Secondary | ICD-10-CM | POA: Insufficient documentation

## 2012-04-29 ENCOUNTER — Encounter (HOSPITAL_COMMUNITY): Payer: Self-pay | Admitting: *Deleted

## 2012-04-29 ENCOUNTER — Emergency Department (HOSPITAL_COMMUNITY)
Admission: EM | Admit: 2012-04-29 | Discharge: 2012-04-29 | Disposition: A | Discharge status: Home / Self Care | Payer: Self-pay | Attending: Emergency Medicine | Admitting: Emergency Medicine

## 2012-04-29 ENCOUNTER — Encounter (HOSPITAL_COMMUNITY): Payer: Self-pay

## 2012-04-29 ENCOUNTER — Emergency Department (INDEPENDENT_AMBULATORY_CARE_PROVIDER_SITE_OTHER)
Admission: EM | Admit: 2012-04-29 | Discharge: 2012-04-29 | Disposition: A | Discharge status: Other Acute Inpatient Hospital | Payer: Self-pay | Source: Home / Self Care | Attending: Emergency Medicine | Admitting: Emergency Medicine

## 2012-04-29 ENCOUNTER — Emergency Department (HOSPITAL_COMMUNITY): Payer: Self-pay

## 2012-04-29 DIAGNOSIS — R112 Nausea with vomiting, unspecified: Secondary | ICD-10-CM | POA: Insufficient documentation

## 2012-04-29 DIAGNOSIS — K5000 Crohn's disease of small intestine without complications: Secondary | ICD-10-CM | POA: Insufficient documentation

## 2012-04-29 DIAGNOSIS — J45909 Unspecified asthma, uncomplicated: Secondary | ICD-10-CM | POA: Insufficient documentation

## 2012-04-29 DIAGNOSIS — R1031 Right lower quadrant pain: Secondary | ICD-10-CM

## 2012-04-29 DIAGNOSIS — Z79899 Other long term (current) drug therapy: Secondary | ICD-10-CM | POA: Insufficient documentation

## 2012-04-29 DIAGNOSIS — G40909 Epilepsy, unspecified, not intractable, without status epilepticus: Secondary | ICD-10-CM | POA: Insufficient documentation

## 2012-04-29 LAB — CBC
HCT: 43.1 % (ref 39.0–52.0)
Hemoglobin: 14.6 g/dL (ref 13.0–17.0)
MCH: 30.5 pg (ref 26.0–34.0)
MCHC: 33.9 g/dL (ref 30.0–36.0)
MCV: 90 fL (ref 78.0–100.0)
Platelets: 305 10*3/uL (ref 150–400)
RBC: 4.79 MIL/uL (ref 4.22–5.81)
RDW: 12.7 % (ref 11.5–15.5)
WBC: 10.4 10*3/uL (ref 4.0–10.5)

## 2012-04-29 LAB — BASIC METABOLIC PANEL
BUN: 9 mg/dL (ref 6–23)
CO2: 28 mEq/L (ref 19–32)
Calcium: 9.3 mg/dL (ref 8.4–10.5)
Chloride: 100 mEq/L (ref 96–112)
Creatinine, Ser: 0.67 mg/dL (ref 0.50–1.35)
GFR calc Af Amer: >90 mL/min (ref >90)
GFR calc non Af Amer: >90 mL/min (ref >90)
Glucose, Bld: 83 mg/dL (ref 70–99)
Potassium: 4 mEq/L (ref 3.5–5.1)
Sodium: 138 mEq/L (ref 135–145)

## 2012-04-29 LAB — POCT URINALYSIS DIP (DEVICE)
Glucose, UA: NEGATIVE mg/dL
Specific Gravity, Urine: 1.02 (ref 1.005–1.030)
Urobilinogen, UA: 0.2 mg/dL (ref 0.0–1.0)

## 2012-04-29 MED ORDER — MORPHINE SULFATE 2 MG/ML IJ SOLN
4.0000 mg | Freq: Once | INTRAMUSCULAR | Status: AC
  Administered 2012-04-29: 4 mg via INTRAMUSCULAR

## 2012-04-29 MED ORDER — PREDNISONE 20 MG PO TABS
60.0000 mg | ORAL_TABLET | Freq: Once | ORAL | Status: AC
  Administered 2012-04-29: 60 mg via ORAL
  Filled 2012-04-29: qty 3

## 2012-04-29 MED ORDER — IOHEXOL 300 MG/ML  SOLN
20.0000 mL | INTRAMUSCULAR | Status: AC
  Administered 2012-04-29 (×2): 20 mL via ORAL

## 2012-04-29 MED ORDER — PREDNISONE 20 MG PO TABS: 40.0000 mg | ORAL_TABLET | Freq: Every day | ORAL | Status: AC

## 2012-04-29 MED ORDER — OXYCODONE-ACETAMINOPHEN 5-325 MG PO TABS: 1.0000 | ORAL_TABLET | ORAL | Status: AC | PRN

## 2012-04-29 MED ORDER — IOHEXOL 300 MG/ML  SOLN
80.0000 mL | Freq: Once | INTRAMUSCULAR | Status: AC | PRN
  Administered 2012-04-29: 80 mL via INTRAVENOUS

## 2012-04-29 MED ORDER — ONDANSETRON HCL 4 MG/2ML IJ SOLN
4.0000 mg | Freq: Once | INTRAMUSCULAR | Status: AC
  Administered 2012-04-29: 4 mg via INTRAVENOUS
  Filled 2012-04-29: qty 2

## 2012-04-29 MED ORDER — ONDANSETRON HCL 4 MG PO TABS: 4.0000 mg | ORAL_TABLET | Freq: Four times a day (QID) | ORAL | Status: AC | PRN

## 2012-04-29 MED ORDER — ONDANSETRON 4 MG PO TBDP
4.0000 mg | ORAL_TABLET | Freq: Once | ORAL | Status: AC
  Administered 2012-04-29: 4 mg via ORAL

## 2012-04-29 MED ORDER — SODIUM CHLORIDE 0.9 % IV BOLUS (SEPSIS)
1000.0000 mL | Freq: Once | INTRAVENOUS | Status: AC
  Administered 2012-04-29: 1000 mL via INTRAVENOUS

## 2012-04-29 MED ORDER — HYDROMORPHONE HCL PF 1 MG/ML IJ SOLN
1.0000 mg | Freq: Once | INTRAMUSCULAR | Status: AC
  Administered 2012-04-29: 1 mg via INTRAVENOUS
  Filled 2012-04-29: qty 1

## 2012-04-29 MED ORDER — MORPHINE SULFATE 2 MG/ML IJ SOLN
INTRAMUSCULAR | Status: AC
  Filled 2012-04-29: qty 1

## 2012-04-29 MED ORDER — ONDANSETRON 4 MG PO TBDP
ORAL_TABLET | ORAL | Status: AC
  Filled 2012-04-29: qty 1

## 2012-04-29 NOTE — ED Notes (Signed)
Patient transported to CT 

## 2012-04-29 NOTE — ED Provider Notes (Signed)
History    26yM with abdominal pain. Had about 2 months ago in RLQ and saw PCP for this. Reports had blood work which showed elevation in alk phos  and he was referred to gastroenterology. He did not followup. Pain returned in the same location in the same character about 2 weeks ago. Progressively getting worse. Associated with nausea and vomiting. No diarrhea. No blood or mucus in stool. No radiation. No urinary complaints. No fevers or chills. Denies history of similar symptoms prior to 2 months ago. Abdominal surgical hx significant for cholecystectomy.  CSN: [id number]  Arrival date & time 04/29/12  1131   First MD Initiated Contact with Patient 04/29/12 1252      Chief Complaint  Patient presents with  . Abdominal Pain    (Consider location/radiation/quality/duration/timing/severity/associated sxs/prior treatment) HPI  Past Medical History  Diagnosis Date  . Asthma   . Seizures     Past Surgical History  Procedure Date  . Cholecystectomy     History reviewed. No pertinent family history.  History  Substance Use Topics  . Smoking status: Never Smoker   . Smokeless tobacco: Not on file  . Alcohol Use: Yes     occasionally      Review of Systems   Review of symptoms negative unless otherwise noted in HPI.   Allergies  Review of patient's allergies indicates no known allergies.  Home Medications   Current Outpatient Rx  Name Route Sig Dispense Refill  . ALBUTEROL SULFATE HFA 108 (90 BASE) MCG/ACT IN AERS Inhalation Inhale 2 puffs into the lungs every 6 (six) hours as needed. Shortness of breath     . BISMUTH SUBSALICYLATE 262 MG/15ML PO SUSP Oral Take 15 mLs by mouth every 6 (six) hours as needed. Upset stomach    . CALCIUM CARBONATE ANTACID 500 MG PO CHEW Oral Chew 1 tablet by mouth daily as needed. Upset stomach    . FLUTICASONE-SALMETEROL 100-50 MCG/DOSE IN AEPB Inhalation Inhale 1 puff into the lungs every 12 (twelve) hours.      Marland Kitchen  HYDROCODONE-ACETAMINOPHEN 5-325 MG PO TABS Oral Take 1 tablet by mouth every 6 (six) hours as needed. For pain    . IBUPROFEN 200 MG PO TABS Oral Take 600 mg by mouth every 6 (six) hours as needed. Pain.     Marland Kitchen LEVETIRACETAM 500 MG PO TABS Oral Take 500 mg by mouth every 12 (twelve) hours.      Marland Kitchen MONTELUKAST SODIUM 10 MG PO TABS Oral Take 10 mg by mouth every morning.      BP 120/68  Pulse 66  Temp(Src) 98.7 F (37.1 C) (Oral)  Resp 18  SpO2 95%  Physical Exam  Nursing note and vitals reviewed. Constitutional: He appears well-developed and well-nourished. No distress.       Laying in bed. NAD.  HENT:  Head: Normocephalic and atraumatic.  Eyes: Conjunctivae are normal. Right eye exhibits no discharge. Left eye exhibits no discharge.  Neck: Neck supple.  Cardiovascular: Normal rate, regular rhythm and normal heart sounds.  Exam reveals no gallop and no friction rub.   No murmur heard. Pulmonary/Chest: Effort normal and breath sounds normal. No respiratory distress.  Abdominal: Soft. He exhibits no distension. There is tenderness.       Abdomen soft and nondistended. Mild to moderate tenderness in the right lower quadrant. No guarding or rebound tenderness. No masses palpated to  Genitourinary:       No CVA tenderness  Musculoskeletal: He exhibits no edema and  no tenderness.  Neurological: He is alert.  Skin: Skin is warm and dry. He is not diaphoretic.  Psychiatric: He has a normal mood and affect. His behavior is normal. Thought content normal.    ED Course  Procedures (including critical care time)   Labs Reviewed  BASIC METABOLIC PANEL  CBC  URINALYSIS, ROUTINE W REFLEX MICROSCOPIC   Ct Abdomen Pelvis W Contrast  04/29/2012  *RADIOLOGY REPORT*  Clinical Data: Right lower quadrant pain  CT ABDOMEN AND PELVIS WITH CONTRAST  Technique:  Multidetector CT imaging of the abdomen and pelvis was performed following the standard protocol during bolus administration of intravenous  contrast.  Contrast: 80mL OMNIPAQUE IOHEXOL 300 MG/ML  SOLN  Comparison: 03/08/2009  Findings: Normal appendix on image 40.  There is mild wall thickening of the terminal ileum.  There is dilatation of the distal ileum with an air-fluid level proximal to the terminal ileum.  There is no evidence of ileocolic intussusception.  No free intraperitoneal gas.  No abscess.  Post cholecystectomy.  Liver, spleen, pancreas, adrenal glands are within normal limits. Prostate and bladder are unremarkable.  Borderline adenopathy in the cecal mesentery.  1.3 cm mesenteric node on image 51.  IMPRESSION: Normal appendix.  Mild inflammatory change of the terminal ileum.  Consider infectious enteritis or inflammatory bowel disease.  Mild associated right lower quadrant adenopathy.  This may be related to terminal ileum disease.  Also consider mesenteric adenitis.  Correlate clinically as for the need for follow-up imaging to ensure resolution of adenopathy.  Original Report Authenticated By: Donavan Burnet, M.D.     1. Abdominal pain   2. Ileitis, terminal       MDM  26yM with RLQ pain. Normal appendix. Inflammation of terminal ileum. Location and duration concerning for inflammatory bowel disease. Less likely infectious. Pt is well appearing. Labs unremarkable. Urine dip from UC reviewed. Previous eval by PCP who recommended GI fu which pt has yet to do. Will provide additional referrals. Prednisone for possible IBS. Pain meds and antiemetics. Afebrile and well appearing. Feel that can be DC'd at this time for close GI fu.        Raeford Razor, MD 04/29/12 [phone]

## 2012-04-29 NOTE — ED Notes (Addendum)
C/o pain RLQ since 5-2, accompanied by nausea, constipation, used laxative w minimal relief (resulted in loose bm w hard chunks); pain left shoulder, abdominal wall firm/rigid?, decreased bowel sounds. Pain worse w palpation, or w attempt to lay down, sit up ; pain worse when attempts to have BM; [facility] reportedly [facility] months ago when he had a similar occurrence (more severe today), that his alkaline phos. Was elevated, and they were going to try to get him in to see a GI provider.

## 2012-04-29 NOTE — ED Notes (Signed)
Sent here from ucc to r/o appendicitis, having two weeks of abd pain, RLQ tenderness, nausea, constipation. No acute distress noted at triage.

## 2012-04-29 NOTE — ED Provider Notes (Signed)
History     CSN: [id number]  Arrival date & time 04/29/12  1478   First MD Initiated Contact with Patient 04/29/12 1021      Chief Complaint  Patient presents with  . Abdominal Pain    (Consider location/radiation/quality/duration/timing/severity/associated sxs/prior treatment) HPI Comments: Patient reports approximately 2 weeks of periumbilical pain, which has now migrated to the right lower quadrant. States pain was initially dull and achy, has now become sharp and constant. Pain is worse with walking, movement, standing up. Better with lying down still. Has a history of laparoscopic cholecystectomy, does not see any bulges for the scars are. Reports some nausea, no vomiting. C/o anorexia, unintentional weight loss. Is also having some constipation, took a laxative last night, had a hard, small bowel movement this morning. This provided mild relief in pain only. No urinary complaints. No testicular pain, swelling.  ROS as noted in HPI. All other ROS negative.   Patient is a 26 y.o. male presenting with abdominal pain. The history is provided by the patient. No language interpreter was used.  Abdominal Pain The primary symptoms of the illness include abdominal pain, nausea and vomiting. The primary symptoms of the illness do not include fever, shortness of breath, diarrhea, hematemesis, hematochezia or dysuria. The current episode started more than 2 days ago. The problem has been gradually worsening.  Additional symptoms associated with the illness include anorexia and constipation. Symptoms associated with the illness do not include chills, urgency, hematuria, frequency or back pain.    Past Medical History  Diagnosis Date  . Asthma   . Seizures     Past Surgical History  Procedure Date  . Cholecystectomy     History reviewed. No pertinent family history.  History  Substance Use Topics  . Smoking status: Never Smoker   . Smokeless tobacco: Not on file  . Alcohol Use: Yes       occasionally      Review of Systems  Constitutional: Negative for fever and chills.  Respiratory: Negative for shortness of breath.   Gastrointestinal: Positive for nausea, vomiting, abdominal pain, constipation and anorexia. Negative for diarrhea, hematochezia and hematemesis.  Genitourinary: Negative for dysuria, urgency, frequency and hematuria.  Musculoskeletal: Negative for back pain.    Allergies  Review of patient's allergies indicates no known allergies.  Home Medications   Current Outpatient Rx  Name Route Sig Dispense Refill  . BISMUTH SUBSALICYLATE 262 MG/15ML PO SUSP Oral Take 15 mLs by mouth every 6 (six) hours as needed.    Marland Kitchen CALCIUM CARBONATE ANTACID 500 MG PO CHEW Oral Chew 1 tablet by mouth daily.    . IBUPROFEN 200 MG PO TABS Oral Take 600 mg by mouth every 6 (six) hours as needed. Pain.     Marland Kitchen SIMETHICONE 125 MG PO CHEW Oral Chew 125 mg by mouth every 6 (six) hours as needed.    . ALBUTEROL SULFATE HFA 108 (90 BASE) MCG/ACT IN AERS Inhalation Inhale 2 puffs into the lungs every 6 (six) hours as needed. Shortness of breath     . FLUTICASONE-SALMETEROL 100-50 MCG/DOSE IN AEPB Inhalation Inhale 1 puff into the lungs every 12 (twelve) hours.      Marland Kitchen LEVETIRACETAM 500 MG PO TABS Oral Take 500 mg by mouth every 12 (twelve) hours.        BP 106/96  Pulse 90  Temp(Src) 98.3 F (36.8 C) (Oral)  Resp 20  SpO2 96%  Physical Exam  Nursing note and vitals reviewed. Constitutional: He is  oriented to person, place, and time. He appears well-developed and well-nourished. He appears distressed.  HENT:  Head: Normocephalic and atraumatic.  Eyes: Conjunctivae and EOM are normal.  Neck: Normal range of motion.  Cardiovascular: Normal rate, regular rhythm, normal heart sounds and intact distal pulses.  Exam reveals no gallop and no friction rub.   No murmur heard. Pulmonary/Chest: Effort normal and breath sounds normal. No respiratory distress. He has no wheezes. He  exhibits no tenderness.  Abdominal: Bowel sounds are normal. He exhibits no distension and no mass. There is tenderness. There is guarding and tenderness at McBurney's point. There is no rebound, no CVA tenderness and negative Murphy's sign.       Healed laparoscopic cholecystectomy scars. Voluntary guarding. Questionable rigidity.  Musculoskeletal: Normal range of motion. He exhibits no edema.  Neurological: He is alert and oriented to person, place, and time.  Skin: Skin is warm and dry. No rash noted.  Psychiatric: He has a normal mood and affect. His behavior is normal. Judgment and thought content normal.    ED Course  Procedures (including critical care time)  Labs Reviewed  POCT URINALYSIS DIP (DEVICE) - Abnormal; Notable for the following:    Bilirubin Urine SMALL (*)    All other components within normal limits   No results found.   1. Abdominal pain, RLQ    Results for orders placed during the hospital encounter of 04/29/12  POCT URINALYSIS DIP (DEVICE)      Component Value Range   Glucose, UA NEGATIVE  NEGATIVE (mg/dL)   Bilirubin Urine SMALL (*) NEGATIVE    Ketones, ur NEGATIVE  NEGATIVE (mg/dL)   Specific Gravity, Urine 1.020  1.005 - 1.030    Hgb urine dipstick NEGATIVE  NEGATIVE    pH 6.0  5.0 - 8.0    Protein, ur NEGATIVE  NEGATIVE (mg/dL)   Urobilinogen, UA 0.2  0.0 - 1.0 (mg/dL)   Nitrite NEGATIVE  NEGATIVE    Leukocytes, UA NEGATIVE  NEGATIVE      MDM  Patient has right lower quadrant tenderness, voluntary guarding. Afebrile, vitals acceptable. No rebound, distention. States this pain feels similar to when he had cholecystitis and on an emergent cholecystectomy. Giving 4 mg morphine IM and zofran 4 mg odt here. Tansferring to the ED to rule out appendicitis.  Luiz Blare, MD 04/29/12 [phone]

## 2012-04-29 NOTE — Discharge Instructions (Signed)
Abdominal Pain Abdominal pain can be caused by many things. Your caregiver decides the seriousness of your pain by an examination and possibly blood tests and X-rays. Many cases can be observed and treated at home. Most abdominal pain is not caused by a disease and will probably improve without treatment. However, in many cases, more time must pass before a clear cause of the pain can be found. Before that point, it may not be known if you need more testing, or if hospitalization or surgery is needed. HOME CARE INSTRUCTIONS   Do not take laxatives unless directed by your caregiver.   Take pain medicine only as directed by your caregiver.   Only take over-the-counter or prescription medicines for pain, discomfort, or fever as directed by your caregiver.   Try a clear liquid diet (broth, tea, or water) for as long as directed by your caregiver. Slowly move to a bland diet as tolerated.  SEEK IMMEDIATE MEDICAL CARE IF:   The pain does not go away.   You have a fever.   You keep throwing up (vomiting).   The pain is felt only in portions of the abdomen. Pain in the right side could possibly be appendicitis. In an adult, pain in the left lower portion of the abdomen could be colitis or diverticulitis.   You pass bloody or black tarry stools.  MAKE SURE YOU:   Understand these instructions.   Will watch your condition.   Will get help right away if you are not doing well or get worse.  Document Released: 09/18/2005 Document Revised: 11/28/2011 Document Reviewed: 07/27/2008 [facility] Patient Information 2012 [place], [place].Crohn's Disease Crohn's disease is a long-term (chronic) soreness and redness (inflammation) of the intestines (bowel). It can affect any portion of the digestive tract, from the mouth to the anus. It can also cause problems outside the digestive tract. Crohn's disease is closely related to a disease called ulcerative colitis (together, these two diseases are called  inflammatory bowel disease).  CAUSES  The cause of Crohn's disease is not known. One Nelva Bush is that, in an easily affected (susceptible) person, the immune system is triggered to attack the body's own digestive tissue. Crohn's disease runs in families. It seems to be more common in certain geographic areas and amongst certain races. There are no clear-cut dietary causes.  SYMPTOMS  Crohn's disease can cause many different symptoms since it can affect many different parts of the body. Symptoms include:  Fatigue.   Weight loss.   Chronic diarrhea, sometime bloody.   Abdominal pain and cramps.   Fever.   Ulcers or canker sores in the mouth or rectum.   Anemia (low red blood cells).   Arthritis, skin problems, and eye problems may occur.  Complications of Crohn's disease can include:  Series of holes (perforation) of the bowel.   Portions of the intestines sticking to each other (adhesions).   Obstruction of the bowel.   Fistula formation, typically in the rectal area but also in other areas. A fistula is an opening between the bowels and the outside, or between the bowels and another organ.   A painful crack in the mucous membrane of the anus (rectal fissure).  DIAGNOSIS  Your caregiver may suspect Crohn's disease based on your symptoms and an exam. Blood tests may confirm that there is a problem. You may be asked to submit a stool specimen for examination. X-rays and CT scans may be necessary. Ultimately, the diagnosis is usually made after a procedure that  uses a flexible tube that is inserted via your mouth or your anus. This is done under sedation and is called either an upper endoscopy or colonoscopy. With these tests, the specialist can take tiny tissue samples and remove them from the inside of the bowel (biopsy). Examination of this biopsy tissue under a microscope can reveal Crohn's disease as the cause of your symptoms. Due to the many different forms that Crohn's disease can  take, symptoms may be present for several years before a diagnosis is made. HOME CARE INSTRUCTIONS   There is no cure for Crohn's disease. The best treatment is frequent checkups with your caregiver.   Symptoms such as diarrhea can be controlled with medications. Avoid foods that have a laxative effect such as fresh fruit, vegetables and dairy products. During flare ups, you can rest your bowel by refraining from solid foods. Drink clear liquids frequently during the day (electrolyte or re-hydrating fluids are best. Your caregiver can help you with suggestions). Drink often to prevent loss of body fluids (dehydration). When diarrhea has cleared, eat small meals and more frequently. Avoid food additives and stimulants such as caffeine (coffee, tea, or chocolate). Enzyme supplements may help if you develop intolerance to a sugar in dairy products (lactose). Ask your caregiver or dietitian about specific dietary instructions.   Try to maintain a positive attitude. Learn relaxation techniques such as self hypnosis, mental imaging, and muscle relaxation.   If possible, avoid stresses which can aggravate your condition.   Exercise regularly.   Follow your diet.   Always get plenty of rest.  SEEK MEDICAL CARE IF:   Your symptoms fail to improve after a week or two of new treatment.   You experience continued weight loss.   You have ongoing crampy digestion or loose bowels.   You develop a new skin rash, skin sores, or eye problems.  SEEK IMMEDIATE MEDICAL CARE IF:   You have worsening of your symptoms or develop new symptoms.   You have a fever.   You develop bloody diarrhea.   You develop severe abdominal pain.  MAKE SURE YOU:   Understand these instructions.   Will watch your condition.   Will get help right away if you are not doing well or get worse.  Document Released: 09/18/2005 Document Revised: 11/28/2011 Document Reviewed: 08/17/2007 [facility] Patient Information 2012  [place], [place].

## 2012-04-29 NOTE — ED Notes (Signed)
Pt has finished drinking contrast, CT contacted.

## 2013-02-03 ENCOUNTER — Encounter (HOSPITAL_COMMUNITY): Payer: Self-pay | Admitting: *Deleted

## 2013-02-03 ENCOUNTER — Emergency Department (INDEPENDENT_AMBULATORY_CARE_PROVIDER_SITE_OTHER): Payer: Self-pay

## 2013-02-03 ENCOUNTER — Emergency Department (HOSPITAL_COMMUNITY)
Admission: EM | Admit: 2013-02-03 | Discharge: 2013-02-03 | Disposition: A | Discharge status: Home / Self Care | Payer: Self-pay | Source: Home / Self Care | Attending: Family Medicine | Admitting: Family Medicine

## 2013-02-03 DIAGNOSIS — J111 Influenza due to unidentified influenza virus with other respiratory manifestations: Secondary | ICD-10-CM

## 2013-02-03 MED ORDER — HYDROCODONE-ACETAMINOPHEN 7.5-325 MG/15ML PO SOLN: 15.0000 mL | Freq: Three times a day (TID) | ORAL | Status: DC | PRN

## 2013-02-03 MED ORDER — PREDNISONE 20 MG PO TABS: ORAL_TABLET | ORAL | Status: DC

## 2013-02-03 MED ORDER — IBUPROFEN 600 MG PO TABS
600.0000 mg | ORAL_TABLET | Freq: Three times a day (TID) | ORAL | Status: DC | PRN
Start: 2013-02-03 — End: 2015-01-05

## 2013-02-03 MED ORDER — ALBUTEROL SULFATE HFA 108 (90 BASE) MCG/ACT IN AERS: 2.0000 | INHALATION_SPRAY | Freq: Four times a day (QID) | RESPIRATORY_TRACT | Status: DC | PRN

## 2013-02-03 NOTE — ED Notes (Signed)
Joel York  Reports  A  Productive  Cough        With  Body  Aches             Chills        X  sev  Days   Pt has  A  History of  Asthma       -  At this  Time  He  Is  Awake  And  Alert  Afebrile        -  Skin is  Warm,   Dry  Cap  Refill is  Brisk   Pt is  Speaking in  Complete  sentances

## 2013-02-09 NOTE — ED Provider Notes (Signed)
History     CSN: [id number]  Arrival date & time 02/03/13  1222   First MD Initiated Contact with Patient 02/03/13 1243      Chief Complaint  Patient presents with  . URI    (Consider location/radiation/quality/duration/timing/severity/associated sxs/prior treatment) HPI Comments: 27 y/o male marijuana smoker with h/o asthma here c/o productive cough with clear sputum for 4 days. Symptoms associated with general malaise, intermittent wheezing, decreased appetite, subjective fever and chest discomfort with coughing and taking a deep breath. Denies abdominal pain, nausea, vomiting or diarrhea. 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      Past Medical History  Diagnosis Date  . Asthma   . Seizures     Past Surgical History  Procedure Laterality Date  . Cholecystectomy      History reviewed. No pertinent family history.  History  Substance Use Topics  . Smoking status: Never Smoker   . Smokeless tobacco: Not on file  . Alcohol Use: Yes     Comment: occasionally      Review of Systems  Constitutional: Positive for fever, chills and appetite change.  HENT: Positive for congestion and rhinorrhea. Negative for sore throat.   Respiratory: Positive for cough, chest tightness and wheezing.   Cardiovascular: Negative for chest pain and leg swelling.  Gastrointestinal: Negative for nausea and abdominal pain.  Skin: Negative for rash.    Allergies  Review of patient's allergies indicates no known allergies.  Home Medications   Current Outpatient Rx  Name  Route  Sig  Dispense  Refill  . albuterol (PROVENTIL HFA;VENTOLIN HFA) 108 (90 BASE) MCG/ACT inhaler   Inhalation   Inhale 2 puffs into the lungs every 6 (six) hours as needed for wheezing or shortness of breath. Shortness of breath   1 Inhaler   0   . bismuth subsalicylate (PEPTO BISMOL) 262 MG/15ML  suspension   Oral   Take 15 mLs by mouth every 6 (six) hours as needed. Upset stomach         . calcium carbonate (TUMS - DOSED IN MG ELEMENTAL CALCIUM) 500 MG chewable tablet   Oral   Chew 1 tablet by mouth daily as needed. Upset stomach         . Fluticasone-Salmeterol (ADVAIR) 100-50 MCG/DOSE AEPB   Inhalation   Inhale 1 puff into the lungs every 12 (twelve) hours.           Marland Kitchen HYDROcodone-acetaminophen (HYCET) 7.5-325 mg/15 ml solution   Oral   Take 15 mLs by mouth every 8 (eight) hours as needed for pain or cough.   120 mL   0   . ibuprofen (ADVIL,MOTRIN) 600 MG tablet   Oral   Take 1 tablet (600 mg total) by mouth every 8 (eight) hours as needed for pain or fever.   20 tablet   0   . levETIRAcetam (KEPPRA) 500 MG tablet   Oral   Take 500 mg by mouth every 12 (twelve) hours.           . montelukast (SINGULAIR) 10 MG tablet   Oral   Take 10 mg by mouth every morning.         . predniSONE (DELTASONE) 20 MG tablet      2 tabs by mouth daily for 3 days   6 tablet   0     BP 124/78  Pulse 112  Temp(Src) 98.5 F (36.9  C) (Oral)  SpO2 95%  Physical Exam  Nursing note and vitals reviewed. Constitutional: He is oriented to person, place, and time. He appears well-developed and well-nourished. No distress.  HENT:  Head: Normocephalic and atraumatic.  Nasal Congestion with erythema and swelling of nasal turbinates, clear rhinorrhea. mild pharyngeal erythema no exudates. No uvula deviation. No trismus. TM's with increased vascular markings and some dullness bilaterally no swelling or bulging  Eyes: Conjunctivae are normal. No scleral icterus.  Neck: Neck supple.  Cardiovascular: Normal rate, regular rhythm and normal heart sounds.   Mild tachycardia  Pulmonary/Chest: Effort normal and breath sounds normal. No respiratory distress. He has no wheezes. He has no rales. He exhibits no tenderness.  Bilateral sporadic expiratory rhonchi.  Lymphadenopathy:     He has no cervical adenopathy.  Neurological: He is alert and oriented to person, place, and time.  Skin: No rash noted. He is not diaphoretic.    ED Course  Procedures (including critical care time)  Labs Reviewed - No data to display No results found.   1. Influenza-like illness       MDM  No PNM in Xrays. Impress asthma symptoms triggered by viral respiratory infection possible influenza. Prescribed albuterol, prednisone, hydrocodone/acetaminophen and motrin. Supportive care and red flags that should prompt patients return to medical attention discussed and provided in writing.         Sharin Grave, MD 02/09/13 [phone]

## 2013-03-26 ENCOUNTER — Emergency Department (INDEPENDENT_AMBULATORY_CARE_PROVIDER_SITE_OTHER)
Admission: EM | Admit: 2013-03-26 | Discharge: 2013-03-26 | Discharge status: Against Medical Advice | Payer: Self-pay | Source: Home / Self Care

## 2013-03-26 ENCOUNTER — Encounter (HOSPITAL_COMMUNITY): Payer: Self-pay | Admitting: *Deleted

## 2013-03-26 DIAGNOSIS — R1084 Generalized abdominal pain: Secondary | ICD-10-CM

## 2013-03-26 DIAGNOSIS — R52 Pain, unspecified: Secondary | ICD-10-CM

## 2013-03-26 NOTE — ED Notes (Signed)
Pt. refused transfer to ED. States he does not want to be there all night and has to be at work @ 0600.  Signed AMA form.  Pt. states he will go tomorrow.  I told pt. They will give him the name of the GI on call tomorrow but if he does not go, I gave him the one on call today- [facility].  Dr. Evette Cristal.  Could not find who he was referred to in the past. It just said referred to GI on call.

## 2013-03-26 NOTE — ED Notes (Addendum)
C/o abdominal pain onset 8-10 days ago.  States he has had pain off and on since his cholecystectomy 2010.  Pain is around his umbilicus. Denies fever or vomiting.  No nausea or diarrhea.  Has been " kind of constipated.  Pain comes in spasms- sharp and stabbing.  States it doubles him over.

## 2013-03-26 NOTE — ED Provider Notes (Signed)
History     CSN: [id number]  Arrival date & time 03/26/13  1756   None     Chief Complaint  Patient presents with  . Abdominal Pain    (Consider location/radiation/quality/duration/timing/severity/associated sxs/prior treatment) HPI Comments: 4 room male presents with severe acute abdominal pain which began approximately 18 days ago. This progressively worse. He rates it a 10 out of 10. He had similar pain approximately one year ago and was evaluated emergency department. There was no acuity to the abdomen at that time was considered to be possible Crohn's disease. He is instructed to followup with GI but he never did.   Past Medical History  Diagnosis Date  . Asthma   . Seizures     Past Surgical History  Procedure Laterality Date  . Cholecystectomy      Family History  Problem Relation Age of Onset  . Crohn's disease Other   . Crohn's disease Maternal Uncle     History  Substance Use Topics  . Smoking status: Never Smoker   . Smokeless tobacco: Not on file  . Alcohol Use: No     Comment: occasionally      Review of Systems  Constitutional: Positive for activity change and appetite change. Negative for fever.  HENT: Negative.   Respiratory: Negative.   Cardiovascular: Negative.   Gastrointestinal: Positive for abdominal pain. Negative for nausea, vomiting, diarrhea, blood in stool, abdominal distention and anal bleeding.       "sometimes has constipation"  Genitourinary: Negative.   Neurological: Negative.     Allergies  Review of patient's allergies indicates no known allergies.  Home Medications   Current Outpatient Rx  Name  Route  Sig  Dispense  Refill  . albuterol (PROVENTIL HFA;VENTOLIN HFA) 108 (90 BASE) MCG/ACT inhaler   Inhalation   Inhale 2 puffs into the lungs every 6 (six) hours as needed for wheezing or shortness of breath. Shortness of breath   1 Inhaler   0   . Fluticasone-Salmeterol (ADVAIR) 100-50 MCG/DOSE AEPB   Inhalation  Inhale 1 puff into the lungs every 12 (twelve) hours.           Marland Kitchen ibuprofen (ADVIL,MOTRIN) 600 MG tablet   Oral   Take 1 tablet (600 mg total) by mouth every 8 (eight) hours as needed for pain or fever.   20 tablet   0   . levETIRAcetam (KEPPRA) 500 MG tablet   Oral   Take 500 mg by mouth every 12 (twelve) hours.           . simethicone (GAS-X) 80 MG chewable tablet   Oral   Chew 80 mg by mouth every 6 (six) hours as needed for flatulence.         . bismuth subsalicylate (PEPTO BISMOL) 262 MG/15ML suspension   Oral   Take 15 mLs by mouth every 6 (six) hours as needed. Upset stomach         . calcium carbonate (TUMS - DOSED IN MG ELEMENTAL CALCIUM) 500 MG chewable tablet   Oral   Chew 1 tablet by mouth daily as needed. Upset stomach         . HYDROcodone-acetaminophen (HYCET) 7.5-325 mg/15 ml solution   Oral   Take 15 mLs by mouth every 8 (eight) hours as needed for pain or cough.   120 mL   0   . montelukast (SINGULAIR) 10 MG tablet   Oral   Take 10 mg by mouth every morning.         Marland Kitchen  predniSONE (DELTASONE) 20 MG tablet      2 tabs by mouth daily for 3 days   6 tablet   0     BP 120/68  Pulse 70  Temp(Src) 98.3 F (36.8 C) (Oral)  Resp 16  SpO2 97%  Physical Exam  Nursing note and vitals reviewed. Constitutional: He is oriented to person, place, and time. He appears well-developed. No distress.  Neck: Neck supple.  Cardiovascular: Normal rate and normal heart sounds.   Pulmonary/Chest: Effort normal and breath sounds normal.  Abdominal:  Abdomen firm, gaurding, no distension. Tender in the epigastrium. No palpable masses.   Musculoskeletal: Normal range of motion. He exhibits no edema.  Neurological: He is alert and oriented to person, place, and time. He exhibits normal muscle tone.  Skin: Skin is warm and dry.  Psychiatric: He has a normal mood and affect.    ED Course  Procedures (including critical care time)  Labs Reviewed - No data  to display No results found.   1. Abdominal pain, acute, generalized       MDM  The patient is being transferred to the emergency department for acute on chronic and progressive abdominal pain. He is complaining of moderate to severe pain. I do not have a diagnosis to exclude an acute abdomen at this time.Apparently has no specific diagnosis as he has not followed with GI as previously instructed.  Shortly after seeing the patient he declined to be sent to the emergent apartment and signed AMA.  Hayden Rasmussen, NP 03/26/13 2012

## 2013-03-31 NOTE — ED Provider Notes (Signed)
Medical screening examination/treatment/procedure(s) were performed by resident physician or non-physician practitioner and as supervising physician I was immediately available for consultation/collaboration.   Barkley Bruns MD.   Linna Hoff, MD 03/31/13 [phone]

## 2013-12-07 ENCOUNTER — Encounter (HOSPITAL_COMMUNITY): Payer: Self-pay | Admitting: Emergency Medicine

## 2013-12-07 ENCOUNTER — Emergency Department (INDEPENDENT_AMBULATORY_CARE_PROVIDER_SITE_OTHER)
Admission: EM | Admit: 2013-12-07 | Discharge: 2013-12-07 | Disposition: A | Discharge status: Home / Self Care | Payer: Self-pay | Source: Home / Self Care | Attending: Family Medicine | Admitting: Family Medicine

## 2013-12-07 DIAGNOSIS — H2020 Lens-induced iridocyclitis, unspecified eye: Secondary | ICD-10-CM

## 2013-12-07 DIAGNOSIS — H2022 Lens-induced iridocyclitis, left eye: Secondary | ICD-10-CM

## 2013-12-07 DIAGNOSIS — H18829 Corneal disorder due to contact lens, unspecified eye: Secondary | ICD-10-CM

## 2013-12-07 DIAGNOSIS — H18823 Corneal disorder due to contact lens, bilateral: Secondary | ICD-10-CM

## 2013-12-07 MED ORDER — ONDANSETRON 4 MG PO TBDP
8.0000 mg | ORAL_TABLET | Freq: Once | ORAL | Status: AC
  Administered 2013-12-07: 8 mg via ORAL

## 2013-12-07 MED ORDER — ONDANSETRON 4 MG PO TBDP
ORAL_TABLET | ORAL | Status: AC
  Filled 2013-12-07: qty 2

## 2013-12-07 MED ORDER — KETOROLAC TROMETHAMINE 0.4 % OP SOLN: 1.0000 [drp] | Freq: Two times a day (BID) | OPHTHALMIC | Status: DC

## 2013-12-07 MED ORDER — IBUPROFEN 800 MG PO TABS
ORAL_TABLET | ORAL | Status: AC
  Filled 2013-12-07: qty 1

## 2013-12-07 MED ORDER — TETRACAINE HCL 0.5 % OP SOLN
2.0000 [drp] | Freq: Once | OPHTHALMIC | Status: AC
  Administered 2013-12-07: 2 [drp] via OPHTHALMIC

## 2013-12-07 MED ORDER — TETRACAINE HCL 0.5 % OP SOLN
OPHTHALMIC | Status: AC
  Filled 2013-12-07: qty 2

## 2013-12-07 MED ORDER — IBUPROFEN 800 MG PO TABS
800.0000 mg | ORAL_TABLET | Freq: Once | ORAL | Status: AC
  Administered 2013-12-07: 800 mg via ORAL

## 2013-12-07 NOTE — ED Notes (Signed)
C/o severe bilateral eye pain. On set 7:30 a.m today.  Denies injury.  Constant tearing.  States eye pain and tearing causing nose to run. Pt has used cold compresses with no relief.

## 2013-12-07 NOTE — ED Provider Notes (Signed)
Medical screening examination/treatment/procedure(s) were performed by resident physician or non-physician practitioner and as supervising physician I was immediately available for consultation/collaboration.   Dolan Xia DOUGLAS MD.   Ayris Carano D Para Cossey, MD 12/07/13 1225 

## 2013-12-07 NOTE — ED Provider Notes (Signed)
Joel York is a 27 y.o. male who presents to Urgent Care today for bilateral eye burning after taking off contact lenses this morning. Patient reports that he wears 2 week contact lenses and took them off this morning, subsequently developing sudden burning pain. He denies fevers or chills. He reports never having similar sensation in the past. He denies changing solution or contact brand and does not think there was any difference in what the lenses looked like when he opened the package. He reports using as directed (changing every 2 weeks and not waiting longer). Denies other exposure or known inciting cause. Reports multiple sick contacts at work.   Past Medical History  Diagnosis Date  . Asthma   . Seizures    History  Substance Use Topics  . Smoking status: Never Smoker   . Smokeless tobacco: Not on file  . Alcohol Use: No     Comment: occasionally   ROS as above Medications reviewed. Current Facility-Administered Medications  Medication Dose Route Frequency Provider Last Rate Last Dose  . tetracaine (PONTOCAINE) 0.5 % ophthalmic solution 2 drop  2 drop Both Eyes Once Leona Singleton, MD       Current Outpatient Prescriptions  Medication Sig Dispense Refill  . Fluticasone-Salmeterol (ADVAIR) 100-50 MCG/DOSE AEPB Inhale 1 puff into the lungs every 12 (twelve) hours.        Marland Kitchen albuterol (PROVENTIL HFA;VENTOLIN HFA) 108 (90 BASE) MCG/ACT inhaler Inhale 2 puffs into the lungs every 6 (six) hours as needed for wheezing or shortness of breath. Shortness of breath  1 Inhaler  0  . bismuth subsalicylate (PEPTO BISMOL) 262 MG/15ML suspension Take 15 mLs by mouth every 6 (six) hours as needed. Upset stomach      . calcium carbonate (TUMS - DOSED IN MG ELEMENTAL CALCIUM) 500 MG chewable tablet Chew 1 tablet by mouth daily as needed. Upset stomach      . HYDROcodone-acetaminophen (HYCET) 7.5-325 mg/15 ml solution Take 15 mLs by mouth every 8 (eight) hours as needed for pain or cough.  120  mL  0  . ibuprofen (ADVIL,MOTRIN) 600 MG tablet Take 1 tablet (600 mg total) by mouth every 8 (eight) hours as needed for pain or fever.  20 tablet  0  . levETIRAcetam (KEPPRA) 500 MG tablet Take 500 mg by mouth every 12 (twelve) hours.        . montelukast (SINGULAIR) 10 MG tablet Take 10 mg by mouth every morning.      . predniSONE (DELTASONE) 20 MG tablet 2 tabs by mouth daily for 3 days  6 tablet  0  . simethicone (GAS-X) 80 MG chewable tablet Chew 80 mg by mouth every 6 (six) hours as needed for flatulence.        Exam:  BP 142/95  Pulse 80  Temp(Src) 98.9 F (37.2 C) (Oral)  Resp 14  SpO2 100% Gen: appears very uncomfortable covering eyes  HEENT: eyes shut tight, clear tearing, able to open slightly but close again, mild conjunctival erythema, no exudate, EOMI, pupils equal and round, erythema and mild inflammation bilateral eyelids.  Lungs: Normal work of breathing.  Skin: No rash or cyanosis  Fluorescein eye stain:  Right eye mild stippling/staining at 7 o'clock  Left eye staining at 6 o'clock  No results found for this or any previous visit (from the past 24 hour(s)). No results found.  Assessment and Plan: 27 y.o. male with severe bilateral eye burning that was acute with positive florescein eye stain left  eye concerning for abrasion vs infection and minimal improvement with numbing eye drops. Ddx includes abrasion, allergic conjunctivitis, iritis due to contact lens. - Administered pontocaine 0.5% ophthalmic solution 2 drops per eye x 3 with minimal improvement in burning - With findings on staining, and per call to on call Ophtalmologist Dr Karleen Hampshire [phone]), recommended same-day evaluation by Dr Karleen Hampshire at [facility].  Pt voiced understanding. - Ibuprofen 600mg  PO x 1 and zofran PO 4mg  administered for headache after eye staining and thorough exam - Recommended contacts out for 1-2 weeks. - Recommended and rx provided for acular 0.4 % 1 drop BID     Leona Singleton, MD 12/07/13 1221

## 2015-01-05 ENCOUNTER — Emergency Department (INDEPENDENT_AMBULATORY_CARE_PROVIDER_SITE_OTHER)
Admission: EM | Admit: 2015-01-05 | Discharge: 2015-01-05 | Disposition: A | Discharge status: Home / Self Care | Payer: Managed Care, Other (non HMO) | Source: Home / Self Care | Attending: Family Medicine | Admitting: Family Medicine

## 2015-01-05 ENCOUNTER — Encounter (HOSPITAL_COMMUNITY): Payer: Self-pay | Admitting: Emergency Medicine

## 2015-01-05 DIAGNOSIS — J0101 Acute recurrent maxillary sinusitis: Secondary | ICD-10-CM

## 2015-01-05 DIAGNOSIS — J40 Bronchitis, not specified as acute or chronic: Secondary | ICD-10-CM

## 2015-01-05 MED ORDER — PREDNISONE 10 MG PO TABS: 30.0000 mg | ORAL_TABLET | Freq: Every day | ORAL | Status: DC

## 2015-01-05 MED ORDER — HYDROCODONE-ACETAMINOPHEN 5-325 MG PO TABS: 1.0000 | ORAL_TABLET | Freq: Four times a day (QID) | ORAL | Status: DC | PRN

## 2015-01-05 MED ORDER — AMOXICILLIN-POT CLAVULANATE 875-125 MG PO TABS: 1.0000 | ORAL_TABLET | Freq: Two times a day (BID) | ORAL | Status: DC

## 2015-01-05 MED ORDER — IPRATROPIUM BROMIDE 0.06 % NA SOLN: 2.0000 | Freq: Four times a day (QID) | NASAL | Status: DC

## 2015-01-05 NOTE — ED Provider Notes (Signed)
Joel York is a 29 y.o. male who presents to Urgent Care today for right facial pain and pressure.  Symptoms are present for 2 days. Patient also has a cough and nasal discharge. No wheezing or shortness of breath. Symptoms are consistent with previous episodes of bacterial sinus infection. 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 No vomiting or diarrhea.   Past Medical History  Diagnosis Date  . Asthma   . Seizures    Past Surgical History  Procedure Laterality Date  . Cholecystectomy     History  Substance Use Topics  . Smoking status: Never Smoker   . Smokeless tobacco: Not on file  . Alcohol Use: No     Comment: occasionally   ROS as above Medications: No current facility-administered medications for this encounter.   Current Outpatient Prescriptions  Medication Sig Dispense Refill  . albuterol (PROVENTIL HFA;VENTOLIN HFA) 108 (90 BASE) MCG/ACT inhaler Inhale 2 puffs into the lungs every 6 (six) hours as needed for wheezing or shortness of breath. Shortness of breath 1 Inhaler 0  . amoxicillin-clavulanate (AUGMENTIN) 875-125 MG per tablet Take 1 tablet by mouth every 12 (twelve) hours. 14 tablet 0  . calcium carbonate (TUMS - DOSED IN MG ELEMENTAL CALCIUM) 500 MG chewable tablet Chew 1 tablet by mouth daily as needed. Upset stomach    . Fluticasone-Salmeterol (ADVAIR) 100-50 MCG/DOSE AEPB Inhale 1 puff into the lungs every 12 (twelve) hours.      Marland Kitchen. HYDROcodone-acetaminophen (NORCO/VICODIN) 5-325 MG per tablet Take 1 tablet by mouth every 6 (six) hours as needed (and to sleep at night). 15 tablet 0  . ipratropium (ATROVENT) 0.06 % nasal spray Place 2 sprays into both nostrils 4 (four) times daily. 15 mL 1  . levETIRAcetam (KEPPRA) 500 MG tablet Take 500 mg by mouth every 12 (twelve) hours.      . montelukast (SINGULAIR) 10 MG tablet Take 10 mg by mouth every morning.    . predniSONE (DELTASONE) 10 MG tablet Take 3 tablets (30 mg total) by  mouth daily. 15 tablet 0   Allergies  Allergen Reactions  . Peanuts [Peanut Oil]      Exam:  BP 115/81 mmHg  Pulse 95  Temp(Src) 98.9 F (37.2 C) (Oral)  Resp 18  SpO2 97% Gen: Well NAD HEENT: EOMI,  MMM tender to palpation right maxillary sinus. Nose is been slightly to the right. Lungs: Normal work of breathing. CTABL Heart: RRR no MRG Abd: NABS, Soft. Nondistended, Nontender Exts: Brisk capillary refill, warm and well perfused.   No results found for this or any previous visit (from the past 24 hour(s)). No results found.  Assessment and Plan: 29 y.o. male with sinusitis and bronchitis. Treat with Augmentin and prednisone and Norco for cough and pain. Atrovent spray as well.  Discussed warning signs or symptoms. Please see discharge instructions. Patient expresses understanding.     Rodolph BongEvan S Corey, MD 01/05/15 [phone]

## 2015-01-05 NOTE — ED Notes (Signed)
Pt states sinus pain, pressure and congestion x2 days.  He also reports a cough that has caused his chest to be sore.

## 2015-01-05 NOTE — Discharge Instructions (Signed)
Thank you for coming in today. Call or go to the emergency room if you get worse, have trouble breathing, have chest pains, or palpitations.  Use norco for severe pain and at night to control cough.   Sinusitis Sinusitis is redness, soreness, and inflammation of the paranasal sinuses. Paranasal sinuses are air pockets within the bones of your face (beneath the eyes, the middle of the forehead, or above the eyes). In healthy paranasal sinuses, mucus is able to drain out, and air is able to circulate through them by way of your nose. However, when your paranasal sinuses are inflamed, mucus and air can become trapped. This can allow bacteria and other germs to grow and cause infection. Sinusitis can develop quickly and last only a short time (acute) or continue over a long period (chronic). Sinusitis that lasts for more than 12 weeks is considered chronic.  CAUSES  Causes of sinusitis include:  Allergies.  Structural abnormalities, such as displacement of the cartilage that separates your nostrils (deviated septum), which can decrease the air flow through your nose and sinuses and affect sinus drainage.  Functional abnormalities, such as when the small hairs (cilia) that line your sinuses and help remove mucus do not work properly or are not present. SIGNS AND SYMPTOMS  Symptoms of acute and chronic sinusitis are the same. The primary symptoms are pain and pressure around the affected sinuses. Other symptoms include:  Upper toothache.  Earache.  Headache.  Bad breath.  Decreased sense of smell and taste.  A cough, which worsens when you are lying flat.  Fatigue.  Fever.  Thick drainage from your nose, which often is green and may contain pus (purulent).  Swelling and warmth over the affected sinuses. DIAGNOSIS  Your health care provider will perform a physical exam. During the exam, your health care provider may:  Look in your nose for signs of abnormal growths in your nostrils  (nasal polyps).  Tap over the affected sinus to check for signs of infection.  View the inside of your sinuses (endoscopy) using an imaging device that has a light attached (endoscope). If your health care provider suspects that you have chronic sinusitis, one or more of the following tests may be recommended:  Allergy tests.  Nasal culture. A sample of mucus is taken from your nose, sent to a lab, and screened for bacteria.  Nasal cytology. A sample of mucus is taken from your nose and examined by your health care provider to determine if your sinusitis is related to an allergy. TREATMENT  Most cases of acute sinusitis are related to a viral infection and will resolve on their own within 10 days. Sometimes medicines are prescribed to help relieve symptoms (pain medicine, decongestants, nasal steroid sprays, or saline sprays).  However, for sinusitis related to a bacterial infection, your health care provider will prescribe antibiotic medicines. These are medicines that will help kill the bacteria causing the infection.  Rarely, sinusitis is caused by a fungal infection. In theses cases, your health care provider will prescribe antifungal medicine. For some cases of chronic sinusitis, surgery is needed. Generally, these are cases in which sinusitis recurs more than 3 times per year, despite other treatments. HOME CARE INSTRUCTIONS   Drink plenty of water. Water helps thin the mucus so your sinuses can drain more easily.  Use a humidifier.  Inhale steam 3 to 4 times a day (for example, sit in the bathroom with the shower running).  Apply a warm, moist washcloth to your  face 3 to 4 times a day, or as directed by your health care provider.  Use saline nasal sprays to help moisten and clean your sinuses.  Take medicines only as directed by your health care provider.  If you were prescribed either an antibiotic or antifungal medicine, finish it all even if you start to feel better. SEEK  IMMEDIATE MEDICAL CARE IF:  You have increasing pain or severe headaches.  You have nausea, vomiting, or drowsiness.  You have swelling around your face.  You have vision problems.  You have a stiff neck.  You have difficulty breathing. MAKE SURE YOU:   Understand these instructions.  Will watch your condition.  Will get help right away if you are not doing well or get worse. Document Released: 12/09/2005 Document Revised: 04/25/2014 Document Reviewed: 12/24/2011 [facility] Patient Information 2015 [place], [place]. This information is not intended to replace advice given to you by your health care provider. Make sure you discuss any questions you have with your health care provider.

## 2015-02-06 ENCOUNTER — Emergency Department (INDEPENDENT_AMBULATORY_CARE_PROVIDER_SITE_OTHER)
Admission: EM | Admit: 2015-02-06 | Discharge: 2015-02-06 | Disposition: A | Discharge status: Home / Self Care | Payer: Managed Care, Other (non HMO) | Source: Home / Self Care | Attending: Family Medicine | Admitting: Family Medicine

## 2015-02-06 ENCOUNTER — Encounter (HOSPITAL_COMMUNITY): Payer: Self-pay | Admitting: *Deleted

## 2015-02-06 DIAGNOSIS — M545 Low back pain, unspecified: Secondary | ICD-10-CM

## 2015-02-06 DIAGNOSIS — J0101 Acute recurrent maxillary sinusitis: Secondary | ICD-10-CM

## 2015-02-06 MED ORDER — HYDROCODONE-ACETAMINOPHEN 5-325 MG PO TABS: 1.0000 | ORAL_TABLET | Freq: Four times a day (QID) | ORAL | Status: DC | PRN

## 2015-02-06 MED ORDER — CEFDINIR 300 MG PO CAPS: 300.0000 mg | ORAL_CAPSULE | Freq: Two times a day (BID) | ORAL | Status: DC

## 2015-02-06 MED ORDER — METHYLPREDNISOLONE ACETATE PF 80 MG/ML IJ SUSP
80.0000 mg | Freq: Once | INTRAMUSCULAR | Status: AC
  Administered 2015-02-06: 80 mg via INTRAMUSCULAR

## 2015-02-06 MED ORDER — KETOROLAC TROMETHAMINE 60 MG/2ML IM SOLN
60.0000 mg | Freq: Once | INTRAMUSCULAR | Status: AC
  Administered 2015-02-06: 60 mg via INTRAMUSCULAR

## 2015-02-06 MED ORDER — PREDNISONE 5 MG PO KIT: PACK | ORAL | Status: DC

## 2015-02-06 MED ORDER — KETOROLAC TROMETHAMINE 60 MG/2ML IM SOLN
INTRAMUSCULAR | Status: AC
Start: 2015-02-06 — End: 2015-02-06
  Filled 2015-02-06: qty 2

## 2015-02-06 MED ORDER — METHYLPREDNISOLONE ACETATE 80 MG/ML IJ SUSP
INTRAMUSCULAR | Status: AC
  Filled 2015-02-06: qty 1

## 2015-02-06 MED ORDER — HYDROCODONE-ACETAMINOPHEN 5-325 MG PO TABS
1.0000 | ORAL_TABLET | Freq: Once | ORAL | Status: AC
  Administered 2015-02-06: 1 via ORAL

## 2015-02-06 MED ORDER — HYDROCODONE-ACETAMINOPHEN 5-325 MG PO TABS
ORAL_TABLET | ORAL | Status: AC
  Filled 2015-02-06: qty 1

## 2015-02-06 NOTE — ED Provider Notes (Signed)
Joel York is a 29 y.o. male who presents to Urgent Care today for right lumbar pain. Patient notes severe right lower back pain without radiation. No weakness or numbness bowel bladder dysfunction. No injury. Patient has tried ibuprofen which helps. Patient has pain started after a lot of sneezing. Patient initially notes continued right-sided sinus pain and pressure with bloody pus discharge present for one month. Patient was treated with prednisone and Augmentin recently. That helped a bit but symptoms returned shortly afterwards.   Past Medical History  Diagnosis Date  . Asthma   . Seizures    Past Surgical History  Procedure Laterality Date  . Cholecystectomy     History  Substance Use Topics  . Smoking status: Never Smoker   . Smokeless tobacco: Not on file  . Alcohol Use: Yes     Comment: occasionally   ROS as above Medications: Current Facility-Administered Medications  Medication Dose Route Frequency Provider Last Rate Last Dose  . HYDROcodone-acetaminophen (NORCO/VICODIN) 5-325 MG per tablet 1 tablet  1 tablet Oral Once Rodolph Bong, MD      . ketorolac (TORADOL) injection 60 mg  60 mg Intramuscular Once Rodolph Bong, MD      . methylPREDNISolone acetate PF (DEPO-MEDROL) injection 80 mg  80 mg Intramuscular Once Rodolph Bong, MD       Current Outpatient Prescriptions  Medication Sig Dispense Refill  . albuterol (PROVENTIL HFA;VENTOLIN HFA) 108 (90 BASE) MCG/ACT inhaler Inhale 2 puffs into the lungs every 6 (six) hours as needed for wheezing or shortness of breath. Shortness of breath 1 Inhaler 0  . ipratropium (ATROVENT) 0.06 % nasal spray Place 2 sprays into both nostrils 4 (four) times daily. 15 mL 1  . amoxicillin-clavulanate (AUGMENTIN) 875-125 MG per tablet Take 1 tablet by mouth every 12 (twelve) hours. 14 tablet 0  . calcium carbonate (TUMS - DOSED IN MG ELEMENTAL CALCIUM) 500 MG chewable tablet Chew 1 tablet by mouth daily as needed. Upset stomach    .  Fluticasone-Salmeterol (ADVAIR) 100-50 MCG/DOSE AEPB Inhale 1 puff into the lungs every 12 (twelve) hours.      Marland Kitchen HYDROcodone-acetaminophen (NORCO/VICODIN) 5-325 MG per tablet Take 1 tablet by mouth every 6 (six) hours as needed (and to sleep at night). 15 tablet 0  . levETIRAcetam (KEPPRA) 500 MG tablet Take 500 mg by mouth every 12 (twelve) hours.      . montelukast (SINGULAIR) 10 MG tablet Take 10 mg by mouth every morning.    . predniSONE (DELTASONE) 10 MG tablet Take 3 tablets (30 mg total) by mouth daily. 15 tablet 0   Allergies  Allergen Reactions  . Peanuts [Peanut Oil]      Exam:  BP 108/77 mmHg  Pulse 100  Temp(Src) 99.2 F (37.3 C) (Oral)  Resp 16  SpO2 98% Gen: Well NAD HEENT: EOMI,  MMM tender palpation right maxillary area. Clear nasal discharge. Lungs: Normal work of breathing. CTABL Heart: RRR no MRG Abd: NABS, Soft. Nondistended, Nontender Exts: Brisk capillary refill, warm and well perfused.  Back: Nontender to spinal midline tenderness to palpation right SI joint. Negative straight leg raise test bilaterally. Reflexes are equal and normal bilateral knees and ankles. Lower extremity strength is intact throughout.    Patient was given 60 mg IM Toradol, 80 mg IM Depo-Medrol and 1 Norco prior to discharge.  No results found for this or any previous visit (from the past 24 hour(s)). No results found.  Assessment and Plan: 29 y.o. male  with lumbago and sinusitis. Treat with prednisone dose pack, Omnicef, and norco.    Discussed warning signs or symptoms. Please see discharge instructions. Patient expresses understanding.     Rodolph BongEvan S Izaya [place], MD 02/06/15 [phone]

## 2015-02-06 NOTE — Discharge Instructions (Signed)
Thank you for coming in today. Come back or go to the emergency room if you notice new weakness new numbness problems walking or bowel or bladder problems.  Back Pain, Adult Low back pain is very common. About 1 in 5 people have back pain.The cause of low back pain is rarely dangerous. The pain often gets better over time.About half of people with a sudden onset of back pain feel better in just 2 weeks. About 8 in 10 people feel better by 6 weeks.  CAUSES Some common causes of back pain include:  Strain of the muscles or ligaments supporting the spine.  Wear and tear (degeneration) of the spinal discs.  Arthritis.  Direct injury to the back. DIAGNOSIS Most of the time, the direct cause of low back pain is not known.However, back pain can be treated effectively even when the exact cause of the pain is unknown.Answering your caregiver's questions about your overall health and symptoms is one of the most accurate ways to make sure the cause of your pain is not dangerous. If your caregiver needs more information, he or she may order lab work or imaging tests (X-rays or MRIs).However, even if imaging tests show changes in your back, this usually does not require surgery. HOME CARE INSTRUCTIONS For many people, back pain returns.Since low back pain is rarely dangerous, it is often a condition that people can learn to [facility] their own.   Remain active. It is stressful on the back to sit or stand in one place. Do not sit, drive, or stand in one place for more than 30 minutes at a time. Take short walks on level surfaces as soon as pain allows.Try to increase the length of time you walk each day.  Do not stay in bed.Resting more than 1 or 2 days can delay your recovery.  Do not avoid exercise or work.Your body is made to move.It is not dangerous to be active, even though your back may hurt.Your back will likely heal faster if you return to being active before your pain is gone.  Pay  attention to your body when you bend and lift. Many people have less discomfortwhen lifting if they bend their knees, keep the load close to their bodies,and avoid twisting. Often, the most comfortable positions are those that put less stress on your recovering back.  Find a comfortable position to sleep. Use a firm mattress and lie on your side with your knees slightly bent. If you lie on your back, put a pillow under your knees.  Only take over-the-counter or prescription medicines as directed by your caregiver. Over-the-counter medicines to reduce pain and inflammation are often the most helpful.Your caregiver may prescribe muscle relaxant drugs.These medicines help dull your pain so you can more quickly return to your normal activities and healthy exercise.  Put ice on the injured area.  Put ice in a plastic bag.  Place a towel between your skin and the bag.  Leave the ice on for 15-20 minutes, 03-04 times a day for the first 2 to 3 days. After that, ice and heat may be alternated to reduce pain and spasms.  Ask your caregiver about trying back exercises and gentle massage. This may be of some benefit.  Avoid feeling anxious or stressed.Stress increases muscle tension and can worsen back pain.It is important to recognize when you are anxious or stressed and learn ways to manage it.Exercise is a great option. SEEK MEDICAL CARE IF:  You have pain that is not  relieved with rest or medicine.  You have pain that does not improve in 1 week.  You have new symptoms.  You are generally not feeling well. SEEK IMMEDIATE MEDICAL CARE IF:   You have pain that radiates from your back into your legs.  You develop new bowel or bladder control problems.  You have unusual weakness or numbness in your arms or legs.  You develop nausea or vomiting.  You develop abdominal pain.  You feel faint. Document Released: 12/09/2005 Document Revised: 06/09/2012 Document Reviewed:  04/12/2014 [facility] Patient Information 2015 [place], [place]. This information is not intended to replace advice given to you by your health care provider. Make sure you discuss any questions you have with your health care provider.   Sinusitis Sinusitis is redness, soreness, and inflammation of the paranasal sinuses. Paranasal sinuses are air pockets within the bones of your face (beneath the eyes, the middle of the forehead, or above the eyes). In healthy paranasal sinuses, mucus is able to drain out, and air is able to circulate through them by way of your nose. However, when your paranasal sinuses are inflamed, mucus and air can become trapped. This can allow bacteria and other germs to grow and cause infection. Sinusitis can develop quickly and last only a short time (acute) or continue over a long period (chronic). Sinusitis that lasts for more than 12 weeks is considered chronic.  CAUSES  Causes of sinusitis include:  Allergies.  Structural abnormalities, such as displacement of the cartilage that separates your nostrils (deviated septum), which can decrease the air flow through your nose and sinuses and affect sinus drainage.  Functional abnormalities, such as when the small hairs (cilia) that line your sinuses and help remove mucus do not work properly or are not present. SIGNS AND SYMPTOMS  Symptoms of acute and chronic sinusitis are the same. The primary symptoms are pain and pressure around the affected sinuses. Other symptoms include:  Upper toothache.  Earache.  Headache.  Bad breath.  Decreased sense of smell and taste.  A cough, which worsens when you are lying flat.  Fatigue.  Fever.  Thick drainage from your nose, which often is green and may contain pus (purulent).  Swelling and warmth over the affected sinuses. DIAGNOSIS  Your health care provider will perform a physical exam. During the exam, your health care provider may:  Look in your nose for signs of  abnormal growths in your nostrils (nasal polyps).  Tap over the affected sinus to check for signs of infection.  View the inside of your sinuses (endoscopy) using an imaging device that has a light attached (endoscope). If your health care provider suspects that you have chronic sinusitis, one or more of the following tests may be recommended:  Allergy tests.  Nasal culture. A sample of mucus is taken from your nose, sent to a lab, and screened for bacteria.  Nasal cytology. A sample of mucus is taken from your nose and examined by your health care provider to determine if your sinusitis is related to an allergy. TREATMENT  Most cases of acute sinusitis are related to a viral infection and will resolve on their own within 10 days. Sometimes medicines are prescribed to help relieve symptoms (pain medicine, decongestants, nasal steroid sprays, or saline sprays).  However, for sinusitis related to a bacterial infection, your health care provider will prescribe antibiotic medicines. These are medicines that will help kill the bacteria causing the infection.  Rarely, sinusitis is caused by a fungal  infection. In theses cases, your health care provider will prescribe antifungal medicine. For some cases of chronic sinusitis, surgery is needed. Generally, these are cases in which sinusitis recurs more than 3 times per year, despite other treatments. HOME CARE INSTRUCTIONS   Drink plenty of water. Water helps thin the mucus so your sinuses can drain more easily.  Use a humidifier.  Inhale steam 3 to 4 times a day (for example, sit in the bathroom with the shower running).  Apply a warm, moist washcloth to your face 3 to 4 times a day, or as directed by your health care provider.  Use saline nasal sprays to help moisten and clean your sinuses.  Take medicines only as directed by your health care provider.  If you were prescribed either an antibiotic or antifungal medicine, finish it all even if  you start to feel better. SEEK IMMEDIATE MEDICAL CARE IF:  You have increasing pain or severe headaches.  You have nausea, vomiting, or drowsiness.  You have swelling around your face.  You have vision problems.  You have a stiff neck.  You have difficulty breathing. MAKE SURE YOU:   Understand these instructions.  Will watch your condition.  Will get help right away if you are not doing well or get worse. Document Released: 12/09/2005 Document Revised: 04/25/2014 Document Reviewed: 12/24/2011 [facility] Patient Information 2015 [place], [place]. This information is not intended to replace advice given to you by your health care provider. Make sure you discuss any questions you have with your health care provider.

## 2015-02-06 NOTE — ED Notes (Signed)
Here 1 month ago with a sinus infection.  He said it did not ever go completely away with Augmentin and Prednisone.  No fever noted.  C/o facial pressure.  Sinus drainage is greenish brown and occ. blood tinged. Had a headache everyday last week.

## 2015-02-14 ENCOUNTER — Encounter: Payer: Self-pay | Admitting: Physician Assistant

## 2015-02-14 ENCOUNTER — Ambulatory Visit (INDEPENDENT_AMBULATORY_CARE_PROVIDER_SITE_OTHER): Payer: Managed Care, Other (non HMO) | Admitting: Physician Assistant

## 2015-02-14 VITALS — BP 148/92 | HR 104 | Temp 99.0°F | Resp 16 | Ht 67.0 in | Wt 140.2 lb

## 2015-02-14 DIAGNOSIS — F988 Other specified behavioral and emotional disorders with onset usually occurring in childhood and adolescence: Secondary | ICD-10-CM

## 2015-02-14 DIAGNOSIS — F909 Attention-deficit hyperactivity disorder, unspecified type: Secondary | ICD-10-CM

## 2015-02-14 DIAGNOSIS — J019 Acute sinusitis, unspecified: Secondary | ICD-10-CM

## 2015-02-14 DIAGNOSIS — J4530 Mild persistent asthma, uncomplicated: Secondary | ICD-10-CM | POA: Insufficient documentation

## 2015-02-14 DIAGNOSIS — B9689 Other specified bacterial agents as the cause of diseases classified elsewhere: Secondary | ICD-10-CM | POA: Insufficient documentation

## 2015-02-14 MED ORDER — ALBUTEROL SULFATE HFA 108 (90 BASE) MCG/ACT IN AERS: 2.0000 | INHALATION_SPRAY | Freq: Four times a day (QID) | RESPIRATORY_TRACT | Status: DC | PRN

## 2015-02-14 MED ORDER — FLUTICASONE-SALMETEROL 100-50 MCG/DOSE IN AEPB: 1.0000 | INHALATION_SPRAY | Freq: Two times a day (BID) | RESPIRATORY_TRACT | Status: DC

## 2015-02-14 MED ORDER — MONTELUKAST SODIUM 10 MG PO TABS: 10.0000 mg | ORAL_TABLET | Freq: Every morning | ORAL | Status: DC

## 2015-02-14 NOTE — Assessment & Plan Note (Signed)
Stable.  Medications refilled.  Will restart Singulair.

## 2015-02-14 NOTE — Assessment & Plan Note (Signed)
Patient instructed to finish Cefdinir. Avoid decongestants due to BP elevation today.  Supportive measures discussed.  Strict return precautions given.

## 2015-02-14 NOTE — Progress Notes (Signed)
Patient presents to clinic today to establish care.  Acute Concerns: Sinusitis -- [facility] and started on Cefdinir and Prednisone taper.  Endorses taking as directed.  Symptoms are markedly improved but still present.  Denies new or worsening symptoms.  Chronic Issues: Mild Persistent Asthma -- well controlled with Advair daily.  Has not used Albuterol in several months.  Denies exacerbation.  Denies nighttime awakenings.  Has not been taking Singulair previously prescribed.  ADD -- Endorses being treated since elementary school.  Most recently on Adderall 20 mg BID.  Has been out of medication x 2 weeks.  Denies side effects while on medication.   Past Medical History  Diagnosis Date  . Asthma   . Seizures     Resolved  . ADHD (attention deficit hyperactivity disorder)   . Back pain   . Acid reflux     Past Surgical History  Procedure Laterality Date  . Cholecystectomy    . Wisdom tooth extraction      Current Outpatient Prescriptions on File Prior to Visit  Medication Sig Dispense Refill  . calcium carbonate (TUMS - DOSED IN MG ELEMENTAL CALCIUM) 500 MG chewable tablet Chew 1 tablet by mouth daily as needed. Upset stomach    . HYDROcodone-acetaminophen (NORCO/VICODIN) 5-325 MG per tablet Take 1 tablet by mouth every 6 (six) hours as needed. 15 tablet 0   No current facility-administered medications on file prior to visit.    Allergies  Allergen Reactions  . Other Anaphylaxis    Tree Nuts  . Peanuts [Peanut Oil]     Family History  Problem Relation Age of Onset  . Crohn's disease Maternal Grandmother   . Crohn's disease Maternal Uncle   . Hypertension Mother     Living  . Congestive Heart Failure Maternal Grandmother   . Hypertension Other     Maternal Aunts & Uncles    History   Social History  . Marital Status: Single    Spouse Name: N/A  . Number of Children: N/A  . Years of Education: N/A   Occupational History  . Not on file.    Social History Main Topics  . Smoking status: Light Tobacco Smoker  . Smokeless tobacco: Current User    Types: Chew  . Alcohol Use: 0.0 oz/week    0 Standard drinks or equivalent per week     Comment: occasionally  . Drug Use: No     Comment: last use "many years ago"  . Sexual Activity: Yes   Other Topics Concern  . Not on file   Social History Narrative   ROS Pertinent ROS are listed in the HPI.  BP 148/92 mmHg  Pulse 104  Temp(Src) 99 F (37.2 C) (Oral)  Resp 16  Ht 5\' 7"  (1.702 m)  Wt 140 lb 4 oz (63.617 kg)  BMI 21.96 kg/m2  SpO2 99%  Physical Exam  Constitutional: He is oriented to person, place, and time and well-developed, well-nourished, and in no distress.  HENT:  Head: Normocephalic and atraumatic.  Right Ear: Tympanic membrane, external ear and ear canal normal.  Left Ear: Tympanic membrane, external ear and ear canal normal.  Nose: Mucosal edema and rhinorrhea present. Right sinus exhibits maxillary sinus tenderness. Left sinus exhibits maxillary sinus tenderness.  Mouth/Throat: Uvula is midline, oropharynx is clear and moist and mucous membranes are normal.  Eyes: Conjunctivae are normal. Pupils are equal, round, and reactive to light.  Neck: Neck supple. No thyromegaly present.  Cardiovascular:  Regular rhythm, normal heart sounds and intact distal pulses.  Tachycardia present.   Pulmonary/Chest: Effort normal and breath sounds normal. No respiratory distress. He has no wheezes. He has no rales. He exhibits no tenderness.  Lymphadenopathy:    He has no cervical adenopathy.  Neurological: He is alert and oriented to person, place, and time.  Skin: Skin is warm and dry. No rash noted.  Psychiatric: Affect normal.  Vitals reviewed.  Assessment/Plan: Mild persistent asthma Stable.  Medications refilled.  Will restart Singulair.   Acute bacterial sinusitis Patient instructed to finish Cefdinir. Avoid decongestants due to BP elevation today.   Supportive measures discussed.  Strict return precautions given.   Attention deficit disorder Will no restart Adderall today as BP and pulse elevated.  Suspect most likely due to steroid and decongestant use.  Patient to return for re-evaluation and CPE after completing antibiotics for sinusitis.  Will re-visit starting Adderall again at that time.

## 2015-02-14 NOTE — Progress Notes (Signed)
Pre visit review using our clinic review tool, if applicable. No additional management support is needed unless otherwise documented below in the visit note/SLS  

## 2015-02-14 NOTE — Assessment & Plan Note (Signed)
Will no restart Adderall today as BP and pulse elevated.  Suspect most likely due to steroid and decongestant use.  Patient to return for re-evaluation and CPE after completing antibiotics for sinusitis.  Will re-visit starting Adderall again at that time.

## 2015-02-14 NOTE — Patient Instructions (Addendum)
Please continue medications as directed.  Limit caffeine and nicotine use while on the steroids because it has been impacting your pulse and blood pressure.   I have refilled your asthma medications.    Your pulse is still slightly high today as is your blood pressure.  This is likely due to infection and steroid, but we need to get it back down to normal before restarting your Adderall.  Limit your caffeine intake.    Follow-up with me in 1-2 weeks for a complete physical.  We can recheck the BP and pulse at that time and if back to normal, we can restart your Adderall.   Please return sooner if needed.

## 2015-02-15 ENCOUNTER — Telehealth: Payer: Self-pay | Admitting: Physician Assistant

## 2015-02-15 NOTE — Telephone Encounter (Signed)
emmi emailed °

## 2015-02-21 ENCOUNTER — Encounter: Payer: Self-pay | Admitting: Physician Assistant

## 2015-02-21 ENCOUNTER — Ambulatory Visit (INDEPENDENT_AMBULATORY_CARE_PROVIDER_SITE_OTHER): Payer: Managed Care, Other (non HMO) | Admitting: Physician Assistant

## 2015-02-21 VITALS — BP 138/94 | HR 84 | Temp 98.4°F | Resp 16 | Ht 67.0 in | Wt 137.4 lb

## 2015-02-21 DIAGNOSIS — F909 Attention-deficit hyperactivity disorder, unspecified type: Secondary | ICD-10-CM

## 2015-02-21 DIAGNOSIS — F988 Other specified behavioral and emotional disorders with onset usually occurring in childhood and adolescence: Secondary | ICD-10-CM

## 2015-02-21 DIAGNOSIS — Z0000 Encounter for general adult medical examination without abnormal findings: Secondary | ICD-10-CM

## 2015-02-21 LAB — CBC
HEMATOCRIT: 52.2 % — AB (ref 39.0–52.0)
Hemoglobin: 17.8 g/dL — ABNORMAL HIGH (ref 13.0–17.0)
MCHC: 34.1 g/dL (ref 30.0–36.0)
MCV: 92 fl (ref 78.0–100.0)
Platelets: 307 10*3/uL (ref 150.0–400.0)
RBC: 5.68 Mil/uL (ref 4.22–5.81)
RDW: 13.4 % (ref 11.5–15.5)
WBC: 7.3 10*3/uL (ref 4.0–10.5)

## 2015-02-21 LAB — BASIC METABOLIC PANEL
BUN: 15 mg/dL (ref 6–23)
CALCIUM: 9.3 mg/dL (ref 8.4–10.5)
CO2: 33 mEq/L — ABNORMAL HIGH (ref 19–32)
Chloride: 103 mEq/L (ref 96–112)
Creatinine, Ser: 1.11 mg/dL (ref 0.40–1.50)
GFR: 83.81 mL/min (ref >60.00)
GLUCOSE: 99 mg/dL (ref 70–99)
Potassium: 4.4 mEq/L (ref 3.5–5.1)
SODIUM: 138 meq/L (ref 135–145)

## 2015-02-21 LAB — URINALYSIS, ROUTINE W REFLEX MICROSCOPIC
Bilirubin Urine: NEGATIVE
Hgb urine dipstick: NEGATIVE
KETONES UR: NEGATIVE
Leukocytes, UA: NEGATIVE
Nitrite: NEGATIVE
PH: 6 (ref 5.0–8.0)
RBC / HPF: NONE SEEN (ref 0–?)
Specific Gravity, Urine: 1.02 (ref 1.000–1.030)
Total Protein, Urine: NEGATIVE
Urine Glucose: NEGATIVE
Urobilinogen, UA: 0.2 (ref 0.0–1.0)
WBC, UA: NONE SEEN (ref 0–?)

## 2015-02-21 LAB — LIPID PANEL
Cholesterol: 116 mg/dL (ref 0–200)
HDL: 35.7 mg/dL — AB (ref >39.00)
LDL Cholesterol: 52 mg/dL (ref 0–99)
NONHDL: 80.3
TRIGLYCERIDES: 143 mg/dL (ref 0.0–149.0)
Total CHOL/HDL Ratio: 3
VLDL: 28.6 mg/dL (ref 0.0–40.0)

## 2015-02-21 LAB — HEPATIC FUNCTION PANEL
ALK PHOS: 65 U/L (ref 39–117)
ALT: 13 U/L (ref 0–53)
AST: 15 U/L (ref 0–37)
Albumin: 4.6 g/dL (ref 3.5–5.2)
Bilirubin, Direct: 0.3 mg/dL (ref 0.0–0.3)
TOTAL PROTEIN: 7.2 g/dL (ref 6.0–8.3)
Total Bilirubin: 1.5 mg/dL — ABNORMAL HIGH (ref 0.2–1.2)

## 2015-02-21 LAB — HEMOGLOBIN A1C: HEMOGLOBIN A1C: 5.4 % (ref 4.6–6.5)

## 2015-02-21 MED ORDER — AMPHETAMINE-DEXTROAMPHETAMINE 20 MG PO TABS: 20.0000 mg | ORAL_TABLET | Freq: Two times a day (BID) | ORAL | Status: DC

## 2015-02-21 NOTE — Patient Instructions (Signed)
Please go to the lab for blood work. I will call you with your results.  Continue medications as directed. Follow-up in 6 months.  Preventive Care for Adults A healthy lifestyle and preventive care can promote health and wellness. Preventive health guidelines for men include the following key practices:  A routine yearly physical is a good way to check with your health care provider about your health and preventative screening. It is a chance to share any concerns and updates on your health and to receive a thorough exam.  Visit your dentist for a routine exam and preventative care every 6 months. Brush your teeth twice a day and floss once a day. Good oral hygiene prevents tooth decay and gum disease.  The frequency of eye exams is based on your age, health, family medical history, use of contact lenses, and other factors. Follow your health care provider's recommendations for frequency of eye exams.  Eat a healthy diet. Foods such as vegetables, fruits, whole grains, low-fat dairy products, and lean protein foods contain the nutrients you need without too many calories. Decrease your intake of foods high in solid fats, added sugars, and salt. Eat the right amount of calories for you.Get information about a proper diet from your health care provider, if necessary.  Regular physical exercise is one of the most important things you can do for your health. Most adults should get at least 150 minutes of moderate-intensity exercise (any activity that increases your heart rate and causes you to sweat) each week. In addition, most adults need muscle-strengthening exercises on 2 or more days a week.  Maintain a healthy weight. The body mass index (BMI) is a screening tool to identify possible weight problems. It provides an estimate of body fat based on height and weight. Your health care provider can find your BMI and can help you achieve or maintain a healthy weight.For adults 20 years and  older:  A BMI below 18.5 is considered underweight.  A BMI of 18.5 to 24.9 is normal.  A BMI of 25 to 29.9 is considered overweight.  A BMI of 30 and above is considered obese.  Maintain normal blood lipids and cholesterol levels by exercising and minimizing your intake of saturated fat. Eat a balanced diet with plenty of fruit and vegetables. Blood tests for lipids and cholesterol should begin at age 79 and be repeated every 5 years. If your lipid or cholesterol levels are high, you are over 50, or you are at high risk for heart disease, you may need your cholesterol levels checked more frequently.Ongoing high lipid and cholesterol levels should be treated with medicines if diet and exercise are not working.  If you smoke, find out from your health care provider how to quit. If you do not use tobacco, do not start.  Lung cancer screening is recommended for adults aged 25-80 years who are at high risk for developing lung cancer because of a history of smoking. A yearly low-dose CT scan of the lungs is recommended for people who have at least a 30-pack-year history of smoking and are a current smoker or have quit within the past 15 years. A pack year of smoking is smoking an average of 1 pack of cigarettes a day for 1 year (for example: 1 pack a day for 30 years or 2 packs a day for 15 years). Yearly screening should continue until the smoker has stopped smoking for at least 15 years. Yearly screening should be stopped for people who  develop a health problem that would prevent them from having lung cancer treatment.  If you choose to drink alcohol, do not have more than 2 drinks per day. One drink is considered to be 12 ounces (355 mL) of beer, 5 ounces (148 mL) of wine, or 1.5 ounces (44 mL) of liquor.  Avoid use of street drugs. Do not share needles with anyone. Ask for help if you need support or instructions about stopping the use of drugs.  High blood pressure causes heart disease and  increases the risk of stroke. Your blood pressure should be checked at least every 1-2 years. Ongoing high blood pressure should be treated with medicines, if weight loss and exercise are not effective.  If you are 36-51 years old, ask your health care provider if you should take aspirin to prevent heart disease.  Diabetes screening involves taking a blood sample to check your fasting blood sugar level. This should be done once every 3 years, after age 71, if you are within normal weight and without risk factors for diabetes. Testing should be considered at a younger age or be carried out more frequently if you are overweight and have at least 1 risk factor for diabetes.  Colorectal cancer can be detected and often prevented. Most routine colorectal cancer screening begins at the age of 14 and continues through age 15. However, your health care provider may recommend screening at an earlier age if you have risk factors for colon cancer. On a yearly basis, your health care provider may provide home test kits to check for hidden blood in the stool. Use of a small camera at the end of a tube to directly examine the colon (sigmoidoscopy or colonoscopy) can detect the earliest forms of colorectal cancer. Talk to your health care provider about this at age 46, when routine screening begins. Direct exam of the colon should be repeated every 5-10 years through age 34, unless early forms of precancerous polyps or small growths are found.  People who are at an increased risk for hepatitis B should be screened for this virus. You are considered at high risk for hepatitis B if:  You were born in a country where hepatitis B occurs often. Talk with your health care provider about which countries are considered high risk.  Your parents were born in a high-risk country and you have not received a shot to protect against hepatitis B (hepatitis B vaccine).  You have HIV or AIDS.  You use needles to inject street  drugs.  You live with, or have sex with, someone who has hepatitis B.  You are a man who has sex with other men (MSM).  You get hemodialysis treatment.  You take certain medicines for conditions such as cancer, organ transplantation, and autoimmune conditions.  Hepatitis C blood testing is recommended for all people born from [birth year] through 1965 and any individual with known risks for hepatitis C.  Practice safe sex. Use condoms and avoid high-risk sexual practices to reduce the spread of sexually transmitted infections (STIs). STIs include gonorrhea, chlamydia, syphilis, trichomonas, herpes, HPV, and human immunodeficiency virus (HIV). Herpes, HIV, and HPV are viral illnesses that have no cure. They can result in disability, cancer, and death.  If you are at risk of being infected with HIV, it is recommended that you take a prescription medicine daily to prevent HIV infection. This is called preexposure prophylaxis (PrEP). You are considered at risk if:  You are a man who has sex with  other men (MSM) and have other risk factors.  You are a heterosexual man, are sexually active, and are at increased risk for HIV infection.  You take drugs by injection.  You are sexually active with a partner who has HIV.  Talk with your health care provider about whether you are at high risk of being infected with HIV. If you choose to begin PrEP, you should first be tested for HIV. You should then be tested every 3 months for as long as you are taking PrEP.  A one-time screening for abdominal aortic aneurysm (AAA) and surgical repair of large AAAs by ultrasound are recommended for men ages 10 to 10 years who are current or former smokers.  Healthy men should no longer receive prostate-specific antigen (PSA) blood tests as part of routine cancer screening. Talk with your health care provider about prostate cancer screening.  Testicular cancer screening is not recommended for adult males who have no  symptoms. Screening includes self-exam, a health care provider exam, and other screening tests. Consult with your health care provider about any symptoms you have or any concerns you have about testicular cancer.  Use sunscreen. Apply sunscreen liberally and repeatedly throughout the day. You should seek shade when your shadow is shorter than you. Protect yourself by wearing long sleeves, pants, a wide-brimmed hat, and sunglasses year round, whenever you are outdoors.  Once a month, do a whole-body skin exam, using a mirror to look at the skin on your back. Tell your health care provider about new moles, moles that have irregular borders, moles that are larger than a pencil eraser, or moles that have changed in shape or color.  Stay current with required vaccines (immunizations).  Influenza vaccine. All adults should be immunized every year.  Tetanus, diphtheria, and acellular pertussis (Td, Tdap) vaccine. An adult who has not previously received Tdap or who does not know his vaccine status should receive 1 dose of Tdap. This initial dose should be followed by tetanus and diphtheria toxoids (Td) booster doses every 10 years. Adults with an unknown or incomplete history of completing a 3-dose immunization series with Td-containing vaccines should begin or complete a primary immunization series including a Tdap dose. Adults should receive a Td booster every 10 years.  Varicella vaccine. An adult without evidence of immunity to varicella should receive 2 doses or a second dose if he has previously received 1 dose.  Human papillomavirus (HPV) vaccine. Males aged 40-21 years who have not received the vaccine previously should receive the 3-dose series. Males aged 22-26 years may be immunized. Immunization is recommended through the age of 51 years for any male who has sex with males and did not get any or all doses earlier. Immunization is recommended for any person with an immunocompromised condition  through the age of 35 years if he did not get any or all doses earlier. During the 3-dose series, the second dose should be obtained 4-8 weeks after the first dose. The third dose should be obtained 24 weeks after the first dose and 16 weeks after the second dose.  Zoster vaccine. One dose is recommended for adults aged 47 years or older unless certain conditions are present.  Measles, mumps, and rubella (MMR) vaccine. Adults born before [birth year] generally are considered immune to measles and mumps. Adults born in [birth year] or later should have 1 or more doses of MMR vaccine unless there is a contraindication to the vaccine or there is laboratory evidence of immunity to each  of the three diseases. A routine second dose of MMR vaccine should be obtained at least 28 days after the first dose for students attending postsecondary schools, health care workers, or international travelers. People who received inactivated measles vaccine or an unknown type of measles vaccine during 1963-1967 should receive 2 doses of MMR vaccine. People who received inactivated mumps vaccine or an unknown type of mumps vaccine before 1979 and are at high risk for mumps infection should consider immunization with 2 doses of MMR vaccine. Unvaccinated health care workers born before [birth year] who lack laboratory evidence of measles, mumps, or rubella immunity or laboratory confirmation of disease should consider measles and mumps immunization with 2 doses of MMR vaccine or rubella immunization with 1 dose of MMR vaccine.  Pneumococcal 13-valent conjugate (PCV13) vaccine. When indicated, a person who is uncertain of his immunization history and has no record of immunization should receive the PCV13 vaccine. An adult aged 34 years or older who has certain medical conditions and has not been previously immunized should receive 1 dose of PCV13 vaccine. This PCV13 should be followed with a dose of pneumococcal polysaccharide (PPSV23) vaccine. The PPSV23  vaccine dose should be obtained at least 8 weeks after the dose of PCV13 vaccine. An adult aged 73 years or older who has certain medical conditions and previously received 1 or more doses of PPSV23 vaccine should receive 1 dose of PCV13. The PCV13 vaccine dose should be obtained 1 or more years after the last PPSV23 vaccine dose.  Pneumococcal polysaccharide (PPSV23) vaccine. When PCV13 is also indicated, PCV13 should be obtained first. All adults aged 42 years and older should be immunized. An adult younger than age 64 years who has certain medical conditions should be immunized. Any person who resides in a nursing home or long-term care facility should be immunized. An adult smoker should be immunized. People with an immunocompromised condition and certain other conditions should receive both PCV13 and PPSV23 vaccines. People with human immunodeficiency virus (HIV) infection should be immunized as soon as possible after diagnosis. Immunization during chemotherapy or radiation therapy should be avoided. Routine use of PPSV23 vaccine is not recommended for American Indians, [place] Natives, or people younger than 65 years unless there are medical conditions that require PPSV23 vaccine. When indicated, people who have unknown immunization and have no record of immunization should receive PPSV23 vaccine. One-time revaccination 5 years after the first dose of PPSV23 is recommended for people aged 19-64 years who have chronic kidney failure, nephrotic syndrome, asplenia, or immunocompromised conditions. People who received 1-2 doses of PPSV23 before age 44 years should receive another dose of PPSV23 vaccine at age 52 years or later if at least 5 years have passed since the previous dose. Doses of PPSV23 are not needed for people immunized with PPSV23 at or after age 47 years.  Meningococcal vaccine. Adults with asplenia or persistent complement component deficiencies should receive 2 doses of quadrivalent  meningococcal conjugate (MenACWY-D) vaccine. The doses should be obtained at least 2 months apart. Microbiologists working with certain meningococcal bacteria, [place] recruits, people at risk during an outbreak, and people who travel to or live in countries with a high rate of meningitis should be immunized. A first-year college student up through age 14 years who is living in a residence hall should receive a dose if he did not receive a dose on or after his 16th birthday. Adults who have certain high-risk conditions should receive one or more doses of vaccine.  Hepatitis A  vaccine. Adults who wish to be protected from this disease, have certain high-risk conditions, work with hepatitis A-infected animals, work in hepatitis A research labs, or travel to or work in countries with a high rate of hepatitis A should be immunized. Adults who were previously unvaccinated and who anticipate close contact with an international adoptee during the first 60 days after arrival in the [place] from a country with a high rate of hepatitis A should be immunized.  Hepatitis B vaccine. Adults should be immunized if they wish to be protected from this disease, have certain high-risk conditions, may be exposed to blood or other infectious body fluids, are household contacts or sex partners of hepatitis B positive people, are clients or workers in certain care facilities, or travel to or work in countries with a high rate of hepatitis B.  Haemophilus influenzae type b (Hib) vaccine. A previously unvaccinated person with asplenia or sickle cell disease or having a scheduled splenectomy should receive 1 dose of Hib vaccine. Regardless of previous immunization, a recipient of a hematopoietic stem cell transplant should receive a 3-dose series 6-12 months after his successful transplant. Hib vaccine is not recommended for adults with HIV infection. Preventive Service / Frequency Ages 72 to 71  Blood pressure check.** /  Every 1 to 2 years.  Lipid and cholesterol check.** / Every 5 years beginning at age 34.  Hepatitis C blood test.** / For any individual with known risks for hepatitis C.  Skin self-exam. / Monthly.  Influenza vaccine. / Every year.  Tetanus, diphtheria, and acellular pertussis (Tdap, Td) vaccine.** / Consult your health care provider. 1 dose of Td every 10 years.  Varicella vaccine.** / Consult your health care provider.  HPV vaccine. / 3 doses over 6 months, if 39 or younger.  Measles, mumps, rubella (MMR) vaccine.** / You need at least 1 dose of MMR if you were born in 1957 or later. You may also need a second dose.  Pneumococcal 13-valent conjugate (PCV13) vaccine.** / Consult your health care provider.  Pneumococcal polysaccharide (PPSV23) vaccine.** / 1 to 2 doses if you smoke cigarettes or if you have certain conditions.  Meningococcal vaccine.** / 1 dose if you are age 81 to 22 years and a Market researcher living in a residence hall, or have one of several medical conditions. You may also need additional booster doses.  Hepatitis A vaccine.** / Consult your health care provider.  Hepatitis B vaccine.** / Consult your health care provider.  Haemophilus influenzae type b (Hib) vaccine.** / Consult your health care provider. Ages 100 to 76  Blood pressure check.** / Every 1 to 2 years.  Lipid and cholesterol check.** / Every 5 years beginning at age 64.  Lung cancer screening. / Every year if you are aged 57-80 years and have a 30-pack-year history of smoking and currently smoke or have quit within the past 15 years. Yearly screening is stopped once you have quit smoking for at least 15 years or develop a health problem that would prevent you from having lung cancer treatment.  Fecal occult blood test (FOBT) of stool. / Every year beginning at age 20 and continuing until age 5. You may not have to do this test if you get a colonoscopy every 10 years.  Flexible  sigmoidoscopy** or colonoscopy.** / Every 5 years for a flexible sigmoidoscopy or every 10 years for a colonoscopy beginning at age 16 and continuing until age 66.  Hepatitis C blood test.** / For  all people born from [birth year] through 1964 and any individual with known risks for hepatitis C.  Skin self-exam. / Monthly.  Influenza vaccine. / Every year.  Tetanus, diphtheria, and acellular pertussis (Tdap/Td) vaccine.** / Consult your health care provider. 1 dose of Td every 10 years.  Varicella vaccine.** / Consult your health care provider.  Zoster vaccine.** / 1 dose for adults aged 28 years or older.  Measles, mumps, rubella (MMR) vaccine.** / You need at least 1 dose of MMR if you were born in 1956 or later. You may also need a second dose.  Pneumococcal 13-valent conjugate (PCV13) vaccine.** / Consult your health care provider.  Pneumococcal polysaccharide (PPSV23) vaccine.** / 1 to 2 doses if you smoke cigarettes or if you have certain conditions.  Meningococcal vaccine.** / Consult your health care provider.  Hepatitis A vaccine.** / Consult your health care provider.  Hepatitis B vaccine.** / Consult your health care provider.  Haemophilus influenzae type b (Hib) vaccine.** / Consult your health care provider. Ages 54 and over  Blood pressure check.** / Every 1 to 2 years.  Lipid and cholesterol check.**/ Every 5 years beginning at age 58.  Lung cancer screening. / Every year if you are aged 37-80 years and have a 30-pack-year history of smoking and currently smoke or have quit within the past 15 years. Yearly screening is stopped once you have quit smoking for at least 15 years or develop a health problem that would prevent you from having lung cancer treatment.  Fecal occult blood test (FOBT) of stool. / Every year beginning at age 14 and continuing until age 50. You may not have to do this test if you get a colonoscopy every 10 years.  Flexible sigmoidoscopy** or  colonoscopy.** / Every 5 years for a flexible sigmoidoscopy or every 10 years for a colonoscopy beginning at age 32 and continuing until age 50.  Hepatitis C blood test.** / For all people born from [birth year] through 1964 and any individual with known risks for hepatitis C.  Abdominal aortic aneurysm (AAA) screening.** / A one-time screening for ages 27 to 61 years who are current or former smokers.  Skin self-exam. / Monthly.  Influenza vaccine. / Every year.  Tetanus, diphtheria, and acellular pertussis (Tdap/Td) vaccine.** / 1 dose of Td every 10 years.  Varicella vaccine.** / Consult your health care provider.  Zoster vaccine.** / 1 dose for adults aged 60 years or older.  Pneumococcal 13-valent conjugate (PCV13) vaccine.** / Consult your health care provider.  Pneumococcal polysaccharide (PPSV23) vaccine.** / 1 dose for all adults aged 71 years and older.  Meningococcal vaccine.** / Consult your health care provider.  Hepatitis A vaccine.** / Consult your health care provider.  Hepatitis B vaccine.** / Consult your health care provider.  Haemophilus influenzae type b (Hib) vaccine.** / Consult your health care provider. **Family history and personal history of risk and conditions may change your health care provider's recommendations. Document Released: 02/04/2002 Document Revised: 12/14/2013 Document Reviewed: 05/06/2011 [facility] Patient Information 2015 [place], [place]. This information is not intended to replace advice given to you by your health care provider. Make sure you discuss any questions you have with your health care provider.

## 2015-02-21 NOTE — Progress Notes (Signed)
Patient presents to clinic today for annual exam.  Patient is fasting for labs.  Acute Concerns: No acute concerns today.  Chronic Issues: ADD -- previously on Adderall 20 mg BID with good results.  Has been out of medication for a while.  Was not restarted at last visit as he was fighting off an infection and pulse was tachycardic.  Pulse is normal today now that he is off of the steroids.  Health Maintenance: Dental -- up-to-date Vision -- up-to-date Immunizations -- up-to-date  Past Medical History  Diagnosis Date  . Asthma   . Seizures     Resolved  . ADHD (attention deficit hyperactivity disorder)   . Back pain   . Acid reflux     Past Surgical History  Procedure Laterality Date  . Cholecystectomy    . Wisdom tooth extraction      Current Outpatient Prescriptions on File Prior to Visit  Medication Sig Dispense Refill  . albuterol (PROVENTIL HFA;VENTOLIN HFA) 108 (90 BASE) MCG/ACT inhaler Inhale 2 puffs into the lungs every 6 (six) hours as needed for wheezing or shortness of breath. Shortness of breath 1 Inhaler 0  . calcium carbonate (TUMS - DOSED IN MG ELEMENTAL CALCIUM) 500 MG chewable tablet Chew 1 tablet by mouth daily as needed. Upset stomach    . Fluticasone-Salmeterol (ADVAIR) 100-50 MCG/DOSE AEPB Inhale 1 puff into the lungs every 12 (twelve) hours. 60 each 1  . montelukast (SINGULAIR) 10 MG tablet Take 1 tablet (10 mg total) by mouth every morning. 30 tablet 3   No current facility-administered medications on file prior to visit.    Allergies  Allergen Reactions  . Other Anaphylaxis    Tree Nuts  . Peanuts [Peanut Oil]     Family History  Problem Relation Age of Onset  . Crohn's disease Maternal Grandmother   . Crohn's disease Maternal Uncle   . Hypertension Mother     Living  . Congestive Heart Failure Maternal Grandmother   . Hypertension Other     Maternal Aunts & Uncles    History   Social History  . Marital Status: Single   Spouse Name: N/A  . Number of Children: N/A  . Years of Education: N/A   Occupational History  . Not on file.   Social History Main Topics  . Smoking status: Light Tobacco Smoker  . Smokeless tobacco: Current User    Types: Chew  . Alcohol Use: 0.0 oz/week    0 Standard drinks or equivalent per week     Comment: occasionally  . Drug Use: No     Comment: last use "many years ago"  . Sexual Activity: Yes   Other Topics Concern  . Not on file   Social History Narrative   Review of Systems  Constitutional: Negative for fever and weight loss.  HENT: Negative for ear discharge, ear pain, hearing loss and tinnitus.   Eyes: Negative for blurred vision, double vision, photophobia and pain.  Respiratory: Negative for cough and shortness of breath.   Cardiovascular: Negative for chest pain and palpitations.  Gastrointestinal: Negative for heartburn, nausea, vomiting, abdominal pain, diarrhea, constipation, blood in stool and melena.  Genitourinary: Negative for dysuria, urgency, frequency, hematuria and flank pain.  Musculoskeletal: Negative for falls.  Neurological: Negative for dizziness, loss of consciousness and headaches.  Endo/Heme/Allergies: Negative for environmental allergies.  Psychiatric/Behavioral: Negative for depression, suicidal ideas, hallucinations and substance abuse. The patient is not nervous/anxious and does not have insomnia.  BP 138/94 mmHg  Pulse 84  Temp(Src) 98.4 F (36.9 C) (Oral)  Resp 16  Ht  (1.702 m)  Wt 137 lb 6 oz (62.313 kg)  BMI 21.51 kg/m2  SpO2 99%  Physical Exam  Constitutional: He is oriented to person, place, and time and well-developed, well-nourished, and in no distress.  HENT:  Head: Normocephalic and atraumatic.  Right Ear: External ear normal.  Left Ear: External ear normal.  Nose: Nose normal.  Mouth/Throat: Oropharynx is clear and moist. No oropharyngeal exudate.  Eyes: Conjunctivae and EOM are normal. Pupils are  equal, round, and reactive to light.  Neck: Neck supple. No thyromegaly present.  Cardiovascular: Normal rate, regular rhythm, normal heart sounds and intact distal pulses.   Pulmonary/Chest: Effort normal and breath sounds normal. No respiratory distress. He has no wheezes. He has no rales. He exhibits no tenderness.  Abdominal: Soft. Bowel sounds are normal. He exhibits no distension and no mass. There is no tenderness. There is no rebound and no guarding.  Genitourinary: Testes/scrotum normal and penis normal. No discharge found.  Lymphadenopathy:    He has no cervical adenopathy.  Neurological: He is alert and oriented to person, place, and time.  Skin: Skin is warm and dry. No rash noted.  Psychiatric: Affect normal.  Vitals reviewed.  Assessment/Plan: Attention deficit disorder Pulse at baseline. BP markedly improved off of steroids.  Will refill Adderall with same signature. CSC signed and UDS obtained.  Follow-up in 6 months.   Visit for preventive health examination I have reviewed the patient's medical history in detail and updated the computerized patient record. Health Maintenance up-to-date. Will obtain fasting labs today.  Preventive care discussed.  Handout given.

## 2015-02-21 NOTE — Progress Notes (Signed)
Pre visit review using our clinic review tool, if applicable. No additional management support is needed unless otherwise documented below in the visit note/SLS  

## 2015-02-21 NOTE — Assessment & Plan Note (Signed)
Pulse at baseline. BP markedly improved off of steroids.  Will refill Adderall with same signature. CSC signed and UDS obtained.  Follow-up in 6 months.

## 2015-02-21 NOTE — Assessment & Plan Note (Signed)
I have reviewed the patient's medical history in detail and updated the computerized patient record. Health Maintenance up-to-date. Will obtain fasting labs today.  Preventive care discussed.  Handout given.

## 2015-03-03 ENCOUNTER — Telehealth: Payer: Self-pay | Admitting: *Deleted

## 2015-03-03 DIAGNOSIS — R799 Abnormal finding of blood chemistry, unspecified: Secondary | ICD-10-CM

## 2015-03-03 NOTE — Telephone Encounter (Signed)
Patient informed, understood & agreed; scheduled lab appt Tues, 03.15.16; future lab orders placed/SLS

## 2015-03-03 NOTE — Telephone Encounter (Signed)
-----   Message from Waldon MerlWilliam C Martin, PA-C sent at 02/22/2015  7:03 AM EST ----- Labs look good overall.  His bilirubin and Hgb are slightly elevated -- could be dilutional -- would like for him to return to lab in 2 weeks for repeat CBC and LFTs to reassess.  Otherwise everything looks great.

## 2015-03-07 ENCOUNTER — Other Ambulatory Visit (INDEPENDENT_AMBULATORY_CARE_PROVIDER_SITE_OTHER): Payer: Managed Care, Other (non HMO)

## 2015-03-07 DIAGNOSIS — R799 Abnormal finding of blood chemistry, unspecified: Secondary | ICD-10-CM

## 2015-03-07 LAB — HEPATIC FUNCTION PANEL
ALBUMIN: 4.7 g/dL (ref 3.5–5.2)
ALT: 15 U/L (ref 0–53)
AST: 18 U/L (ref 0–37)
Alkaline Phosphatase: 72 U/L (ref 39–117)
BILIRUBIN DIRECT: 0.1 mg/dL (ref 0.0–0.3)
Total Bilirubin: 0.7 mg/dL (ref 0.2–1.2)
Total Protein: 7.1 g/dL (ref 6.0–8.3)

## 2015-03-07 LAB — CBC WITH DIFFERENTIAL/PLATELET
BASOS PCT: 0.6 % (ref 0.0–3.0)
Basophils Absolute: 0 10*3/uL (ref 0.0–0.1)
EOS PCT: 6.1 % — AB (ref 0.0–5.0)
Eosinophils Absolute: 0.5 10*3/uL (ref 0.0–0.7)
HCT: 49 % (ref 39.0–52.0)
HEMOGLOBIN: 17.1 g/dL — AB (ref 13.0–17.0)
LYMPHS PCT: 42.7 % (ref 12.0–46.0)
Lymphs Abs: 3.4 10*3/uL (ref 0.7–4.0)
MCHC: 35 g/dL (ref 30.0–36.0)
MCV: 90.9 fl (ref 78.0–100.0)
Monocytes Absolute: 0.5 10*3/uL (ref 0.1–1.0)
Monocytes Relative: 6.6 % (ref 3.0–12.0)
NEUTROS PCT: 44 % (ref 43.0–77.0)
Neutro Abs: 3.5 10*3/uL (ref 1.4–7.7)
Platelets: 265 10*3/uL (ref 150.0–400.0)
RBC: 5.39 Mil/uL (ref 4.22–5.81)
RDW: 13.1 % (ref 11.5–15.5)
WBC: 7.9 10*3/uL (ref 4.0–10.5)

## 2015-03-07 NOTE — Telephone Encounter (Signed)
Pt is returning your call regarding lab results, please call back °

## 2015-05-19 ENCOUNTER — Telehealth: Payer: Self-pay | Admitting: Physician Assistant

## 2015-05-19 MED ORDER — AMPHETAMINE-DEXTROAMPHETAMINE 20 MG PO TABS: 20.0000 mg | ORAL_TABLET | Freq: Two times a day (BID) | ORAL | Status: DC

## 2015-05-19 NOTE — Telephone Encounter (Signed)
Requesting Adderall 20mg -Take 1 tablet by mouth 2 times daily. Last refill:02-21-15#60,0 Last OV:02-21-15 No UDS Please advise.//AB/CMA

## 2015-05-19 NOTE — Telephone Encounter (Signed)
Advised pt RX is ready to pick up and urine sample would be left.

## 2015-05-19 NOTE — Telephone Encounter (Signed)
Relation to pt: self Call back number: [phone]   Reason for call:  Pt requesting a refill amphetamine-dextroamphetamine (ADDERALL) 20 MG tablet

## 2015-05-19 NOTE — Telephone Encounter (Signed)
REfill granted.  At front desk for pickup.  Will need to give UDS sample.

## 2015-06-20 ENCOUNTER — Telehealth: Payer: Self-pay | Admitting: Physician Assistant

## 2015-06-20 MED ORDER — AMPHETAMINE-DEXTROAMPHETAMINE 20 MG PO TABS: 20.0000 mg | ORAL_TABLET | Freq: Two times a day (BID) | ORAL | Status: DC

## 2015-06-20 NOTE — Telephone Encounter (Signed)
Caller name: Peter Miniumyler Zipper Relationship to patient: self Can be reached: [phone] Pharmacy:  Reason for call: Pt called for refill on Adderall. He is going out of town tomorrow and needs to pick up today please. He said that last time he picked up RX for 1 month only and completed the UDS. He is asking if he can get RX for 2 months or 3 months this time like he usually gets.

## 2015-06-20 NOTE — Telephone Encounter (Signed)
Refills at front desk for pickup.

## 2015-06-20 NOTE — Telephone Encounter (Signed)
Requesting Adderall 20mg -Take 1 tablet by mouth 2 times daily. Last refill:05-19-15;#60,0 Last OV:02-21-15 No UDS See note below.  Please advise.//AB/CMA

## 2015-06-20 NOTE — Telephone Encounter (Signed)
Notified pt that RX is at front desk for pick up.

## 2015-08-16 ENCOUNTER — Telehealth: Payer: Self-pay | Admitting: Physician Assistant

## 2015-08-16 MED ORDER — AMPHETAMINE-DEXTROAMPHETAMINE 20 MG PO TABS: 20.0000 mg | ORAL_TABLET | Freq: Two times a day (BID) | ORAL | Status: DC

## 2015-08-16 NOTE — Telephone Encounter (Signed)
LMOM with contact name and number [for return call, if needed] RE: requested Rx ready for p/u during regular business hours and fillable date; must give UDS and schedule f/u office visit note attached to Rx per provider instructions/SLS

## 2015-08-16 NOTE — Telephone Encounter (Signed)
Relation to pt: self  Call back [phone]  Reason for call:  Patient requesting a refill amphetamine-dextroamphetamine (ADDERALL) 20 MG tablet

## 2015-08-25 ENCOUNTER — Telehealth: Payer: Self-pay | Admitting: Physician Assistant

## 2015-08-25 NOTE — Telephone Encounter (Signed)
Called patient at [phone] [facility]) and left message to return call.

## 2015-08-25 NOTE — Telephone Encounter (Signed)
UDS results in -- tested positive for opiates he is not prescribed. Also tested + for THC (marijuana). Both of these are violations of his controlled substance contract. He will no longer be able to receive Adderall or controlled substances from our practice.

## 2015-08-29 ENCOUNTER — Ambulatory Visit (INDEPENDENT_AMBULATORY_CARE_PROVIDER_SITE_OTHER): Payer: Managed Care, Other (non HMO) | Admitting: Physician Assistant

## 2015-08-29 ENCOUNTER — Encounter: Payer: Self-pay | Admitting: Physician Assistant

## 2015-08-29 VITALS — BP 142/92 | HR 115 | Temp 98.0°F | Resp 16 | Ht 67.0 in | Wt 149.2 lb

## 2015-08-29 DIAGNOSIS — F909 Attention-deficit hyperactivity disorder, unspecified type: Secondary | ICD-10-CM | POA: Diagnosis not present

## 2015-08-29 DIAGNOSIS — F988 Other specified behavioral and emotional disorders with onset usually occurring in childhood and adolescence: Secondary | ICD-10-CM | POA: Insufficient documentation

## 2015-08-29 NOTE — Assessment & Plan Note (Signed)
UDS with + THC and opiates violating CSC. Office will no longer prescribe. Discussed options. Patient wishes to see specialist. Referral to Psychiatry placed.

## 2015-08-29 NOTE — Progress Notes (Signed)
Pre visit review using our clinic review tool, if applicable. No additional management support is needed unless otherwise documented below in the visit note/SLS  

## 2015-08-29 NOTE — Patient Instructions (Signed)
Please keep your phone on and you will be contacted by Psychiatry for assessment of your ADD and further prescribing of Adderall since I am unable to due to your recent urine drug screen. Continue your current prescription as directed.  Follow-up with me as needed for sick visits and yearly for physicals.

## 2015-08-29 NOTE — Progress Notes (Signed)
    Patient presents to clinic today for medication management regarding ADD. Is currently on Adderal BID with good relief of symptoms Endorses taking as directed. Recent UDS tested positive for marijuana and Hydrocodone which he is not currently prescribes.  Does endorses use of marijuana which is a violation of his CSC. Has old Rx for hydrocodone from February that he states he only takes as needed when back pain flares up.  Past Medical History  Diagnosis Date  . Asthma   . Seizures     Resolved  . ADHD (attention deficit hyperactivity disorder)   . Back pain   . Acid reflux     Current Outpatient Prescriptions on File Prior to Visit  Medication Sig Dispense Refill  . albuterol (PROVENTIL HFA;VENTOLIN HFA) 108 (90 BASE) MCG/ACT inhaler Inhale 2 puffs into the lungs every 6 (six) hours as needed for wheezing or shortness of breath. Shortness of breath 1 Inhaler 0  . calcium carbonate (TUMS - DOSED IN MG ELEMENTAL CALCIUM) 500 MG chewable tablet Chew 1 tablet by mouth daily as needed. Upset stomach    . Fluticasone-Salmeterol (ADVAIR) 100-50 MCG/DOSE AEPB Inhale 1 puff into the lungs every 12 (twelve) hours. 60 each 1  . montelukast (SINGULAIR) 10 MG tablet Take 1 tablet (10 mg total) by mouth every morning. 30 tablet 3   No current facility-administered medications on file prior to visit.    Allergies  Allergen Reactions  . Other Anaphylaxis    Tree Nuts  . Peanuts [Peanut Oil]     Family History  Problem Relation Age of Onset  . Crohn's disease Maternal Grandmother   . Crohn's disease Maternal Uncle   . Hypertension Mother     Living  . Congestive Heart Failure Maternal Grandmother   . Hypertension Other     Maternal Aunts & Uncles    Social History   Social History  . Marital Status: Single    Spouse Name: N/A  . Number of Children: N/A  . Years of Education: N/A   Social History Main Topics  . Smoking status: Light Tobacco Smoker  . Smokeless tobacco: Current  User    Types: Chew  . Alcohol Use: 0.0 oz/week    0 Standard drinks or equivalent per week     Comment: occasionally  . Drug Use: No     Comment: last use "many years ago"  . Sexual Activity: Yes   Other Topics Concern  . None   Social History Narrative   Review of Systems - See HPI.  All other ROS are negative.  BP 142/92 mmHg  Pulse 115  Temp(Src) 98 F (36.7 C) (Oral)  Resp 16  Ht  (1.702 m)  Wt 149 lb 4 oz (67.699 kg)  BMI 23.37 kg/m2  SpO2 99%  Physical Exam  Constitutional: He is well-developed, well-nourished, and in no distress.  HENT:  Head: Normocephalic and atraumatic.  Eyes: Conjunctivae are normal.  Skin: Skin is warm and dry.  Psychiatric: Affect normal.  Vitals reviewed.   No results found for this or any previous visit (from the past 2160 hour(s)).  Assessment/Plan: ADD (attention deficit disorder) UDS with + THC and opiates violating CSC. Office will no longer prescribe. Discussed options. Patient wishes to see specialist. Referral to Psychiatry placed.

## 2015-08-30 NOTE — Telephone Encounter (Signed)
Patient presented for office visit and was notified by provider.

## 2015-09-28 ENCOUNTER — Encounter (HOSPITAL_COMMUNITY): Payer: Self-pay | Admitting: *Deleted

## 2015-09-28 ENCOUNTER — Emergency Department (INDEPENDENT_AMBULATORY_CARE_PROVIDER_SITE_OTHER)
Admission: EM | Admit: 2015-09-28 | Discharge: 2015-09-28 | Disposition: A | Discharge status: Home / Self Care | Payer: Managed Care, Other (non HMO) | Source: Home / Self Care | Attending: Family Medicine | Admitting: Family Medicine

## 2015-09-28 ENCOUNTER — Emergency Department (INDEPENDENT_AMBULATORY_CARE_PROVIDER_SITE_OTHER): Payer: Managed Care, Other (non HMO)

## 2015-09-28 DIAGNOSIS — J3489 Other specified disorders of nose and nasal sinuses: Secondary | ICD-10-CM | POA: Diagnosis not present

## 2015-09-28 MED ORDER — KETOROLAC TROMETHAMINE 30 MG/ML IJ SOLN
INTRAMUSCULAR | Status: AC
  Filled 2015-09-28: qty 1

## 2015-09-28 MED ORDER — KETOROLAC TROMETHAMINE 30 MG/ML IJ SOLN
30.0000 mg | Freq: Once | INTRAMUSCULAR | Status: AC
  Administered 2015-09-28: 30 mg via INTRAMUSCULAR

## 2015-09-28 MED ORDER — PREDNISONE 50 MG PO TABS: ORAL_TABLET | ORAL | Status: DC

## 2015-09-28 MED ORDER — IPRATROPIUM BROMIDE 0.06 % NA SOLN: 2.0000 | Freq: Four times a day (QID) | NASAL | Status: DC

## 2015-09-28 NOTE — ED Provider Notes (Signed)
CSN: [id number]     Arrival date & time 09/28/15  1426 History   First MD Initiated Contact with Patient 09/28/15 1554     Chief Complaint  Patient presents with  . Facial Pain   (Consider location/radiation/quality/duration/timing/severity/associated sxs/prior Treatment) Patient is a 29 y.o. male presenting with URI. The history is provided by the patient.  URI Presenting symptoms: congestion, facial pain and rhinorrhea   Presenting symptoms: no fever   Severity:  Moderate Onset quality:  Gradual Duration:  5 days Progression:  Worsening Chronicity:  Recurrent (has appt on mon with lmd.) Relieved by:  None tried Worsened by:  Nothing tried Ineffective treatments:  None tried Associated symptoms: sinus pain     Past Medical History  Diagnosis Date  . Asthma   . Seizures (HCC)     Resolved  . ADHD (attention deficit hyperactivity disorder)   . Back pain   . Acid reflux    Past Surgical History  Procedure Laterality Date  . Cholecystectomy    . Wisdom tooth extraction     Family History  Problem Relation Age of Onset  . Crohn's disease Maternal Grandmother   . Crohn's disease Maternal Uncle   . Hypertension Mother     Living  . Congestive Heart Failure Maternal Grandmother   . Hypertension Other     Maternal Aunts & Uncles   Social History  Substance Use Topics  . Smoking status: Light Tobacco Smoker  . Smokeless tobacco: Current User    Types: Chew  . Alcohol Use: 0.0 oz/week    0 Standard drinks or equivalent per week     Comment: occasionally    Review of Systems  Constitutional: Negative.  Negative for fever.  HENT: Positive for congestion, rhinorrhea and sinus pressure. Negative for facial swelling.   Respiratory: Negative.   Cardiovascular: Negative.   Gastrointestinal: Negative.   All other systems reviewed and are negative.   Allergies  Other and Peanuts  Home Medications   Prior to Admission medications   Medication Sig Start Date End  Date Taking? Authorizing Provider  albuterol (PROVENTIL HFA;VENTOLIN HFA) 108 (90 BASE) MCG/ACT inhaler Inhale 2 puffs into the lungs every 6 (six) hours as needed for wheezing or shortness of breath. Shortness of breath 02/14/15   Waldon Merl, PA-C  calcium carbonate (TUMS - DOSED IN MG ELEMENTAL CALCIUM) 500 MG chewable tablet Chew 1 tablet by mouth daily as needed. Upset stomach    Historical Provider, MD  Fluticasone-Salmeterol (ADVAIR) 100-50 MCG/DOSE AEPB Inhale 1 puff into the lungs every 12 (twelve) hours. 02/14/15   Waldon Merl, PA-C  ipratropium (ATROVENT) 0.06 % nasal spray Place 2 sprays into both nostrils 4 (four) times daily. 09/28/15   Linna Hoff, MD  montelukast (SINGULAIR) 10 MG tablet Take 1 tablet (10 mg total) by mouth every morning. 02/14/15   Waldon Merl, PA-C  predniSONE (DELTASONE) 50 MG tablet 1 tab daily for 2 days then 1/2 tab daily for 2 days. 09/28/15   Linna Hoff, MD   Meds Ordered and Administered this Visit  Medications - No data to display  BP 139/90 mmHg  Pulse 99  Temp(Src) 98.2 F (36.8 C) (Oral)  Resp 16  SpO2 100% No data found.   Physical Exam  Constitutional: He is oriented to person, place, and time. He appears well-developed and well-nourished. No distress.  HENT:  Right Ear: External ear normal.  Left Ear: External ear normal.  Nose: Sinus tenderness present. Right  sinus exhibits maxillary sinus tenderness. Right sinus exhibits no frontal sinus tenderness. Left sinus exhibits no maxillary sinus tenderness and no frontal sinus tenderness.  Neck: Normal range of motion. Neck supple.  Cardiovascular: Normal heart sounds.   Pulmonary/Chest: Effort normal and breath sounds normal.  Lymphadenopathy:    He has no cervical adenopathy.  Neurological: He is alert and oriented to person, place, and time.  Skin: Skin is warm and dry.  Nursing note and vitals reviewed.   ED Course  Procedures (including critical care time)  Labs  Review Labs Reviewed - No data to display  Imaging Review Dg Sinuses Complete  09/28/2015   CLINICAL DATA:  Right-sided facial pain with nasal mucus  EXAM: PARANASAL SINUSES - COMPLETE 3 + VIEW  COMPARISON:  None.  FINDINGS: [place], water's, lateral, and submentovertex images obtained. Paranasal sinuses are clear. No air-fluid level. No bony destruction or expansion. Mastoids are clear. Nasal septum is essentially midline.  IMPRESSION: Paranasal sinuses clear.   Electronically Signed   By: Bretta Bang III M.D.   On: 09/28/2015 16:41   X-rays reviewed and report per radiologist.   Visual Acuity Review  Right Eye Distance:   Left Eye Distance:   Bilateral Distance:    Right Eye Near:   Left Eye Near:    Bilateral Near:         MDM   1. Sinus pain    rx atrovent prednisone.    Linna Hoff, MD 09/28/15 [phone]

## 2015-09-28 NOTE — ED Notes (Signed)
Pt  Reports    Symptoms   Of   Bloody  Greenish  Sinus  Drainage       With  Pain  r  Side  Face          Symptoms  X  5  Days              Pt  Reports  He  Has  Had   Sinus  Infections  In past

## 2015-11-01 ENCOUNTER — Emergency Department (HOSPITAL_COMMUNITY)
Admission: EM | Admit: 2015-11-01 | Discharge: 2015-11-01 | Disposition: A | Discharge status: Home / Self Care | Payer: Managed Care, Other (non HMO) | Attending: Emergency Medicine | Admitting: Emergency Medicine

## 2015-11-01 ENCOUNTER — Encounter (HOSPITAL_COMMUNITY): Payer: Self-pay | Admitting: Emergency Medicine

## 2015-11-01 DIAGNOSIS — Z8659 Personal history of other mental and behavioral disorders: Secondary | ICD-10-CM | POA: Diagnosis not present

## 2015-11-01 DIAGNOSIS — Z720 Tobacco use: Secondary | ICD-10-CM | POA: Insufficient documentation

## 2015-11-01 DIAGNOSIS — Y9289 Other specified places as the place of occurrence of the external cause: Secondary | ICD-10-CM | POA: Diagnosis not present

## 2015-11-01 DIAGNOSIS — Z9889 Other specified postprocedural states: Secondary | ICD-10-CM | POA: Insufficient documentation

## 2015-11-01 DIAGNOSIS — K029 Dental caries, unspecified: Secondary | ICD-10-CM | POA: Diagnosis not present

## 2015-11-01 DIAGNOSIS — Y998 Other external cause status: Secondary | ICD-10-CM | POA: Insufficient documentation

## 2015-11-01 DIAGNOSIS — K219 Gastro-esophageal reflux disease without esophagitis: Secondary | ICD-10-CM | POA: Diagnosis not present

## 2015-11-01 DIAGNOSIS — J45909 Unspecified asthma, uncomplicated: Secondary | ICD-10-CM | POA: Insufficient documentation

## 2015-11-01 DIAGNOSIS — Z8669 Personal history of other diseases of the nervous system and sense organs: Secondary | ICD-10-CM | POA: Insufficient documentation

## 2015-11-01 DIAGNOSIS — Z79899 Other long term (current) drug therapy: Secondary | ICD-10-CM | POA: Diagnosis not present

## 2015-11-01 DIAGNOSIS — S025XXA Fracture of tooth (traumatic), initial encounter for closed fracture: Secondary | ICD-10-CM | POA: Diagnosis not present

## 2015-11-01 DIAGNOSIS — X58XXXA Exposure to other specified factors, initial encounter: Secondary | ICD-10-CM | POA: Diagnosis not present

## 2015-11-01 DIAGNOSIS — Y9389 Activity, other specified: Secondary | ICD-10-CM | POA: Diagnosis not present

## 2015-11-01 MED ORDER — HYDROCODONE-ACETAMINOPHEN 5-325 MG PO TABS: 1.0000 | ORAL_TABLET | ORAL | Status: DC | PRN

## 2015-11-01 MED ORDER — PENICILLIN V POTASSIUM 500 MG PO TABS: 500.0000 mg | ORAL_TABLET | Freq: Three times a day (TID) | ORAL | Status: DC

## 2015-11-01 NOTE — Discharge Instructions (Signed)

## 2015-11-01 NOTE — ED Provider Notes (Signed)
CSN: [id number]     Arrival date & time 11/01/15  2305 History   First MD Initiated Contact with Patient 11/01/15 2312     Chief Complaint  Patient presents with  . Dental Pain     (Consider location/radiation/quality/duration/timing/severity/associated sxs/prior Treatment) HPI  Joel York 29 y.o. male   Dental Pain Onset: 4-5 days ago Location:  Bilateral back molars Quality:  Aching/sharp/throbbing Severity:  severe Timing:  acute Progression:  He broke both of his teeth eating, the pain started out mild and then acutely worsened today, he tried to pack the tooth cement but it made it worse. Chronicity:  acute Relieved by:  originally OTC pain medications and now nothing Worsened by: eating, touching the tooth, cold/hot Ineffective treatments: OTC medications Associated symptoms:pain  Negative ROS: Nausea, vomiting, diarrhea,  Fevers, myalgias, weakness, confusion, neck pain, rash, SOB, diaphoresis,ear pain, sore throat, trouble swallowing, drooling, difficulty opening jaw.      Past Medical History  Diagnosis Date  . Asthma   . Seizures (HCC)     Resolved  . ADHD (attention deficit hyperactivity disorder)   . Back pain   . Acid reflux    Past Surgical History  Procedure Laterality Date  . Cholecystectomy    . Wisdom tooth extraction     Family History  Problem Relation Age of Onset  . Crohn's disease Maternal Grandmother   . Crohn's disease Maternal Uncle   . Hypertension Mother     Living  . Congestive Heart Failure Maternal Grandmother   . Hypertension Other     Maternal Aunts & Uncles   Social History  Substance Use Topics  . Smoking status: Light Tobacco Smoker  . Smokeless tobacco: Current User    Types: Chew  . Alcohol Use: 0.0 oz/week    0 Standard drinks or equivalent per week    Review of Systems  ROS: See HPI Constitutional: no fever  Eyes: no drainage  ENT: no runny nose  Cardiovascular: no chest pain  Resp: no SOB  GI: no  vomiting GU: no dysuria Integumentary: no rash  Allergy: no hives  Musculoskeletal: no leg swelling  Neurological: no slurred speech ROS otherwise negative   Allergies  Other and Peanuts  Home Medications   Prior to Admission medications   Medication Sig Start Date End Date Taking? Authorizing Provider  albuterol (PROVENTIL HFA;VENTOLIN HFA) 108 (90 BASE) MCG/ACT inhaler Inhale 2 puffs into the lungs every 6 (six) hours as needed for wheezing or shortness of breath. Shortness of breath 02/14/15   Waldon Merl, PA-C  calcium carbonate (TUMS - DOSED IN MG ELEMENTAL CALCIUM) 500 MG chewable tablet Chew 1 tablet by mouth daily as needed. Upset stomach    Historical Provider, MD  Fluticasone-Salmeterol (ADVAIR) 100-50 MCG/DOSE AEPB Inhale 1 puff into the lungs every 12 (twelve) hours. 02/14/15   Waldon Merl, PA-C  HYDROcodone-acetaminophen (NORCO/VICODIN) 5-325 MG tablet Take 1-2 tablets by mouth every 4 (four) hours as needed. 11/01/15   Trease Bremner Neva Seat, PA-C  ipratropium (ATROVENT) 0.06 % nasal spray Place 2 sprays into both nostrils 4 (four) times daily. 09/28/15   Linna Hoff, MD  montelukast (SINGULAIR) 10 MG tablet Take 1 tablet (10 mg total) by mouth every morning. 02/14/15   Waldon Merl, PA-C  penicillin v potassium (VEETID) 500 MG tablet Take 1 tablet (500 mg total) by mouth 3 (three) times daily. 11/01/15   Sanjeev Main Neva Seat, PA-C  predniSONE (DELTASONE) 50 MG tablet 1 tab daily for 2 days  then 1/2 tab daily for 2 days. 09/28/15   Linna HoffJames D Kindl, MD   BP 143/104 mmHg  Pulse 87  Temp(Src) 98.2 F (36.8 C) (Oral)  Resp 16  Ht 5\' 8"  (1.727 m)  Wt 150 lb (68.04 kg)  BMI 22.81 kg/m2  SpO2 100% Physical Exam  Constitutional: He appears well-developed and well-nourished.  HENT:  Head: Normocephalic and atraumatic.  Mouth/Throat: Dental caries present.    Eyes: Conjunctivae and EOM are normal. Pupils are equal, round, and reactive to light.  Neck: Normal range of motion.  Neck supple.  Cardiovascular: Normal rate and regular rhythm.   Pulmonary/Chest: Effort normal and breath sounds normal.  Nursing note and vitals reviewed.  ED Course  Procedures (including critical care time) Labs Review Labs Reviewed - No data to display  Imaging Review No results found. I have personally reviewed and evaluated these images and lab results as part of my medical decision-making.   EKG Interpretation None      MDM   Final diagnoses:  Broken tooth, closed, initial encounter   DENTAL NERVE BLOCK Date/Time: 11/01/2015 Performed by: Dorthula MatasGREENE, Rithik Odea G Authorized by: Dorthula MatasGREENE, Sukhdeep Wieting G Consent: Verbal consent obtained. Risks and benefits: risks, benefits and alternatives were discussed Consent given by: patient Indications: pain relief Body area: face/mouth Laterality: left lower molar Needle gauge: 25 G Local anesthetic: lidocaine 2% without epinephrine Anesthetic total: 2 ml Outcome: pain improved Patient tolerance: Patient tolerated the procedure well with no immediate complications. Comments: Patient had complete relief of pain.  Medications - No data to display  29 y.o.Ival Viele's evaluation in the Emergency Department is complete.  We have discussed signs and symptoms that warrant return to the ED, such as changes or worsening in symptoms. No emergent s/sx's present. Patent airway. No trismus.  No neck tenderness or protrusion of tongue or floor of mouth. Patient will be given an rx for Vicodin and Penicillin. He will be referred to a dentist with instructions for follow-up.  Vital signs are stable at discharge. Filed Vitals:   11/01/15 2312  BP: 143/104  Pulse: 87  Temp: 98.2 F (36.8 C)  Resp: 16    Patient/guardian has voiced understanding and agreed to follow-up with the PCP or specialist.       Marlon Peliffany Daphne Karrer, PA-C 11/01/15 2348  Lyndal Pulleyaniel Knott, MD 11/03/15 1739

## 2015-11-01 NOTE — ED Notes (Signed)
Pt. reports bilateral lower molar pain onset 4 days ago unrelieved by OTC Ibuprofen .

## 2016-01-19 ENCOUNTER — Other Ambulatory Visit: Payer: Self-pay

## 2016-01-19 DIAGNOSIS — J4530 Mild persistent asthma, uncomplicated: Secondary | ICD-10-CM

## 2016-01-19 MED ORDER — ALBUTEROL SULFATE HFA 108 (90 BASE) MCG/ACT IN AERS: 2.0000 | INHALATION_SPRAY | Freq: Four times a day (QID) | RESPIRATORY_TRACT | Status: DC | PRN

## 2017-05-24 IMAGING — DX DG SINUSES COMPLETE 3+V
4 series · 4 of 4 positions shown · non-contrast
Comparison: None.

CLINICAL DATA: Right-sided facial pain with nasal mucus

EXAM:
PARANASAL SINUSES - COMPLETE 3 + VIEW

[skull calldwell]
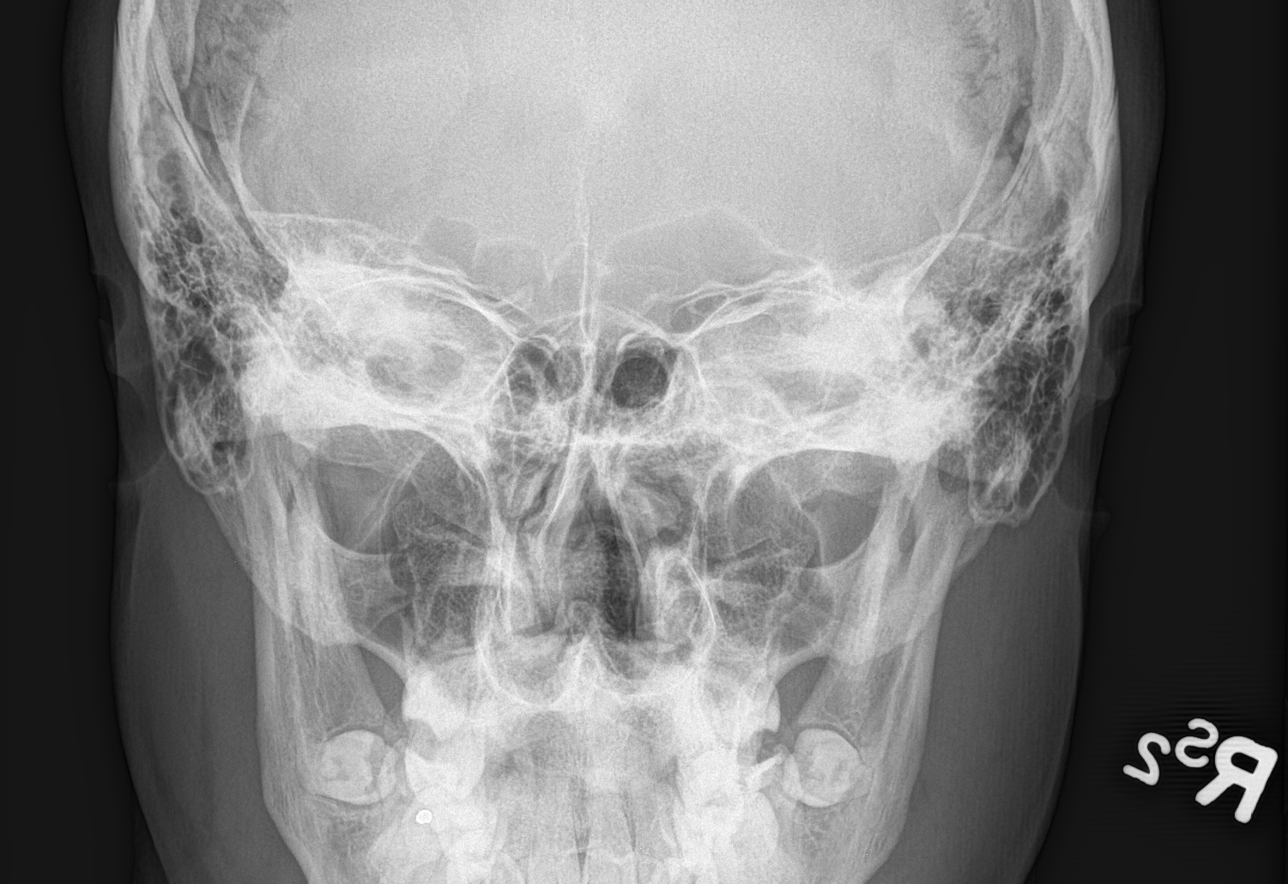

[skull waters]
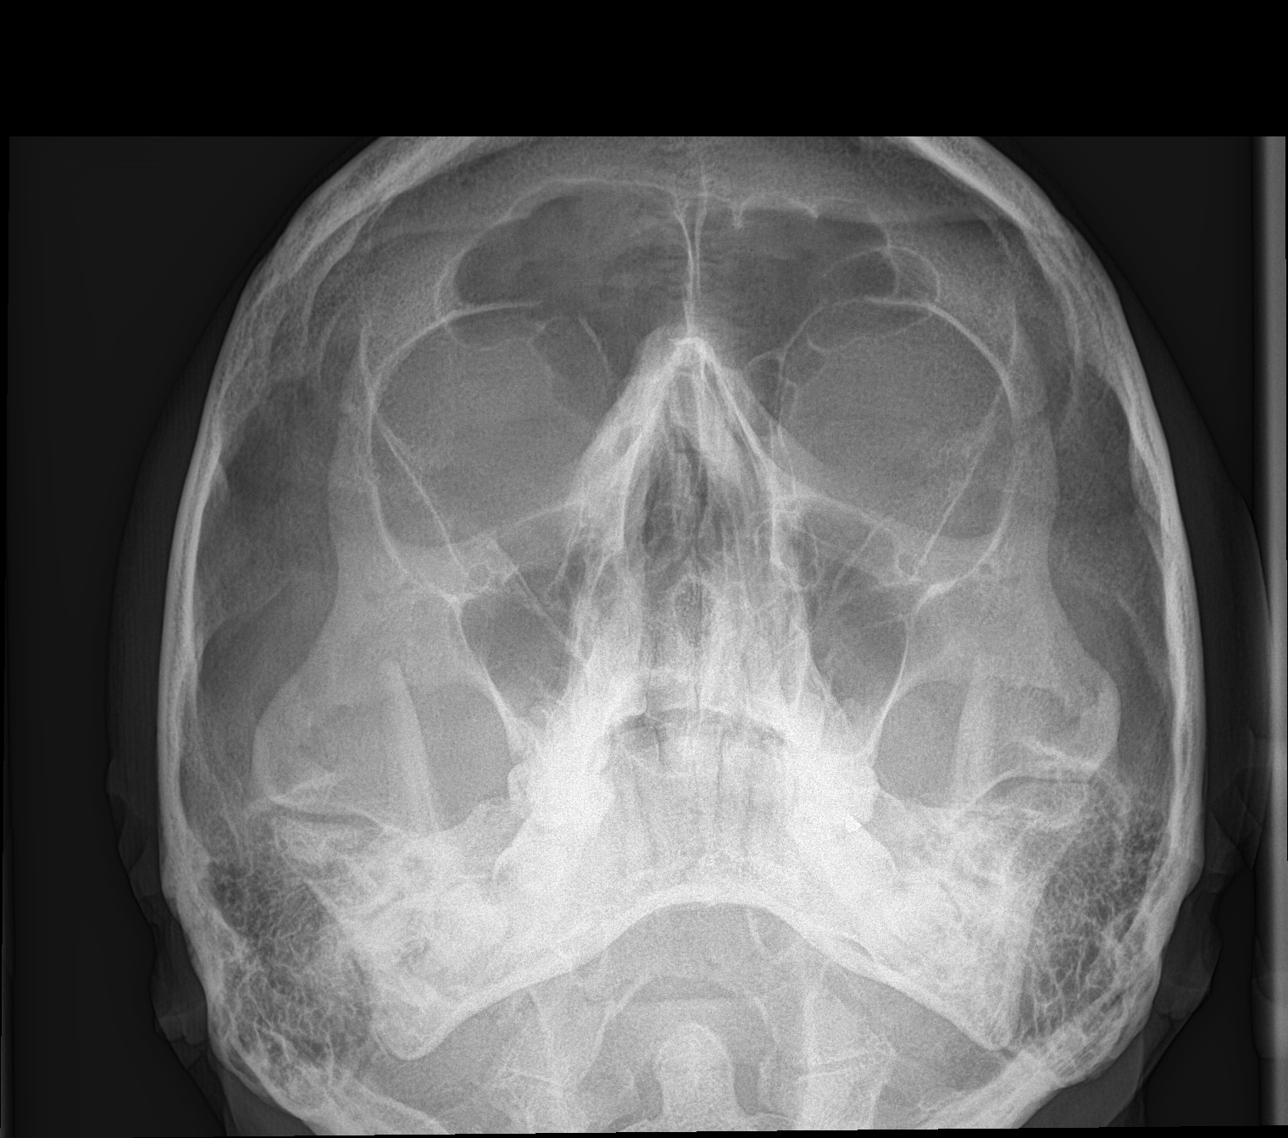

[skull smv]
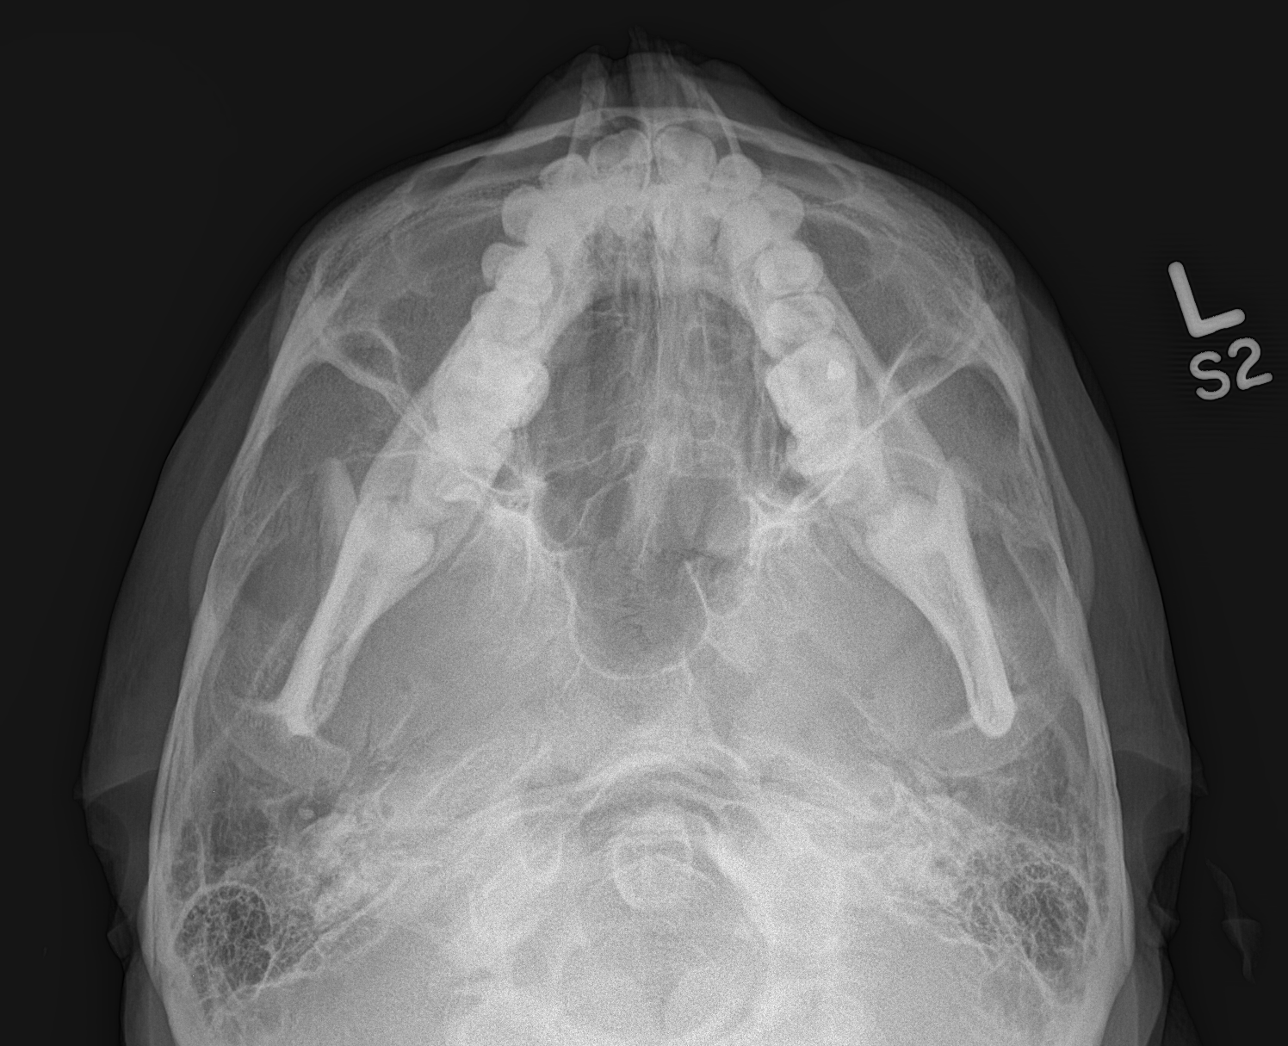

[skull lat]
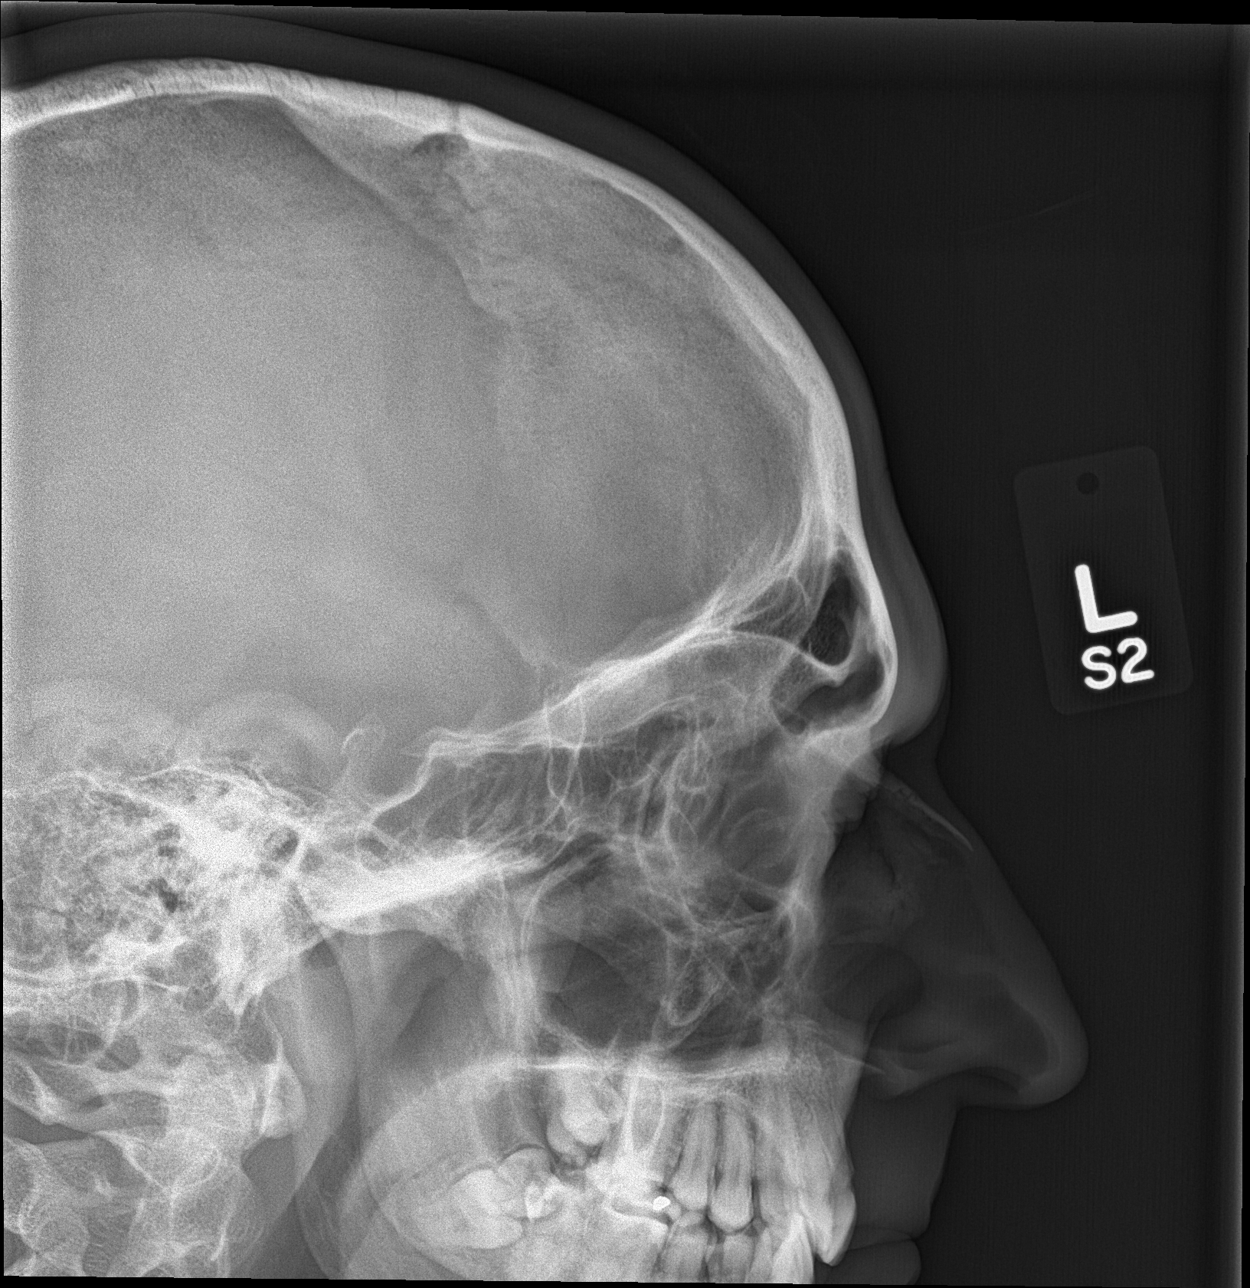

[4 of 4 positions shown; findings below may reference images not displayed]

FINDINGS: Bland, Tsige, lateral, and submentovertex images obtained.
Paranasal sinuses are clear. No air-fluid level. No bony destruction
or expansion. Mastoids are clear. Nasal septum is essentially
midline.
IMPRESSION: Paranasal sinuses clear.

## 2017-05-30 DIAGNOSIS — F1121 Opioid dependence, in remission: Secondary | ICD-10-CM | POA: Insufficient documentation

## 2020-05-17 DIAGNOSIS — J019 Acute sinusitis, unspecified: Secondary | ICD-10-CM | POA: Diagnosis not present

## 2020-09-27 DIAGNOSIS — R7401 Elevation of levels of liver transaminase levels: Secondary | ICD-10-CM | POA: Diagnosis not present

## 2020-11-23 DIAGNOSIS — H5213 Myopia, bilateral: Secondary | ICD-10-CM | POA: Diagnosis not present

## 2020-11-23 DIAGNOSIS — H52223 Regular astigmatism, bilateral: Secondary | ICD-10-CM | POA: Diagnosis not present

## 2021-01-03 DIAGNOSIS — Z1152 Encounter for screening for COVID-19: Secondary | ICD-10-CM | POA: Diagnosis not present

## 2021-01-12 DIAGNOSIS — H5213 Myopia, bilateral: Secondary | ICD-10-CM | POA: Diagnosis not present

## 2022-09-11 ENCOUNTER — Ambulatory Visit
Admission: EM | Admit: 2022-09-11 | Discharge: 2022-09-11 | Disposition: A | Discharge status: Home / Self Care | Payer: Medicaid Other | Attending: Urgent Care | Admitting: Urgent Care

## 2022-09-11 ENCOUNTER — Ambulatory Visit: Payer: Self-pay

## 2022-09-11 ENCOUNTER — Ambulatory Visit: Admit: 2022-09-11 | Discharge status: Against Medical Advice | Payer: Self-pay

## 2022-09-11 ENCOUNTER — Encounter: Payer: Self-pay | Admitting: Emergency Medicine

## 2022-09-11 DIAGNOSIS — J329 Chronic sinusitis, unspecified: Secondary | ICD-10-CM | POA: Diagnosis not present

## 2022-09-11 DIAGNOSIS — J309 Allergic rhinitis, unspecified: Secondary | ICD-10-CM | POA: Diagnosis not present

## 2022-09-11 DIAGNOSIS — J4530 Mild persistent asthma, uncomplicated: Secondary | ICD-10-CM | POA: Insufficient documentation

## 2022-09-11 DIAGNOSIS — Z20822 Contact with and (suspected) exposure to covid-19: Secondary | ICD-10-CM | POA: Insufficient documentation

## 2022-09-11 MED ORDER — AMOXICILLIN 875 MG PO TABS: 875.0000 mg | ORAL_TABLET | Freq: Two times a day (BID) | ORAL | 0 refills | Status: DC

## 2022-09-11 MED ORDER — PREDNISONE 50 MG PO TABS: 50.0000 mg | ORAL_TABLET | Freq: Every day | ORAL | 0 refills | Status: DC

## 2022-09-11 MED ORDER — LEVOCETIRIZINE DIHYDROCHLORIDE 5 MG PO TABS: 5.0000 mg | ORAL_TABLET | Freq: Every evening | ORAL | 0 refills | Status: DC

## 2022-09-11 NOTE — ED Provider Notes (Signed)
[facility]  Note:  This document was prepared using Systems analyst and may include unintentional dictation errors.  MRN: [id number] DOB: December 30, 1985  Subjective:   Joel York is a 36 y.o. male presenting for 5 day history of acute onset worsening sinus congestion, right sided sinus pain that radiates into right ear, sinus drainage. Has a history of recurrent sinus infections, allergic rhinitis and asthma.   No current facility-administered medications for this encounter.  Current Outpatient Medications:    albuterol (PROVENTIL HFA;VENTOLIN HFA) 108 (90 Base) MCG/ACT inhaler, Inhale 2 puffs into the lungs every 6 (six) hours as needed for wheezing or shortness of breath. Shortness of breath, Disp: 1 Inhaler, Rfl: 0   calcium carbonate (TUMS - DOSED IN MG ELEMENTAL CALCIUM) 500 MG chewable tablet, Chew 1 tablet by mouth daily as needed. Upset stomach, Disp: , Rfl:    Fluticasone-Salmeterol (ADVAIR) 100-50 MCG/DOSE AEPB, Inhale 1 puff into the lungs every 12 (twelve) hours., Disp: 60 each, Rfl: 1   HYDROcodone-acetaminophen (NORCO/VICODIN) 5-325 MG tablet, Take 1-2 tablets by mouth every 4 (four) hours as needed., Disp: 20 tablet, Rfl: 0   ipratropium (ATROVENT) 0.06 % nasal spray, Place 2 sprays into both nostrils 4 (four) times daily., Disp: 15 mL, Rfl: 1   montelukast (SINGULAIR) 10 MG tablet, Take 1 tablet (10 mg total) by mouth every morning., Disp: 30 tablet, Rfl: 3   penicillin v potassium (VEETID) 500 MG tablet, Take 1 tablet (500 mg total) by mouth 3 (three) times daily., Disp: 30 tablet, Rfl: 0   predniSONE (DELTASONE) 50 MG tablet, 1 tab daily for 2 days then 1/2 tab daily for 2 days., Disp: 3 tablet, Rfl: 0   Allergies  Allergen Reactions   Other Anaphylaxis    Tree Nuts   Peanuts [Peanut Oil]     Past Medical History:  Diagnosis Date   Acid reflux    ADHD (attention deficit hyperactivity disorder)    Asthma    Back pain     Seizures (HCC)    Resolved     Past Surgical History:  Procedure Laterality Date   CHOLECYSTECTOMY     WISDOM TOOTH EXTRACTION      Family History  Problem Relation Age of Onset   Crohn's disease Maternal Grandmother    Crohn's disease Maternal Uncle    Hypertension Mother        Living   Congestive Heart Failure Maternal Grandmother    Hypertension Other        Maternal Aunts & Uncles    Social History   Tobacco Use   Smoking status: Light Smoker   Smokeless tobacco: Current    Types: Chew  Substance Use Topics   Alcohol use: Yes    Alcohol/week: 0.0 standard drinks of alcohol   Drug use: No    Types: Marijuana, Cocaine    Comment: last use "many years ago"    ROS   Objective:   Vitals: BP 120/83 (BP Location: Right Arm)   Pulse (!) 115   Temp 98.9 F (37.2 C) (Oral)   Resp 16   SpO2 94%   Physical Exam Constitutional:      General: He is not in acute distress.    Appearance: Normal appearance. He is well-developed and normal weight. He is not ill-appearing, toxic-appearing or diaphoretic.  HENT:     Head: Normocephalic and atraumatic.     Right Ear: Tympanic membrane, ear canal and external ear normal. There is  no impacted cerumen.     Left Ear: Tympanic membrane, ear canal and external ear normal. There is no impacted cerumen.     Nose: Congestion present. No rhinorrhea.     Right Turbinates: Swollen.     Left Turbinates: Swollen.     Right Sinus: Maxillary sinus tenderness present.     Left Sinus: No maxillary sinus tenderness.     Mouth/Throat:     Mouth: Mucous membranes are moist.     Pharynx: No oropharyngeal exudate or posterior oropharyngeal erythema.  Eyes:     General: No scleral icterus.       Right eye: No discharge.        Left eye: No discharge.     Extraocular Movements: Extraocular movements intact.     Conjunctiva/sclera: Conjunctivae normal.  Cardiovascular:     Rate and Rhythm: Normal rate and regular rhythm.     Heart  sounds: Normal heart sounds. No murmur heard.    No friction rub. No gallop.  Pulmonary:     Effort: Pulmonary effort is normal. No respiratory distress.     Breath sounds: Normal breath sounds. No stridor. No wheezing, rhonchi or rales.  Musculoskeletal:     Cervical back: Normal range of motion and neck supple. No rigidity. No muscular tenderness.  Neurological:     General: No focal deficit present.     Mental Status: He is alert and oriented to person, place, and time.  Psychiatric:        Mood and Affect: Mood normal.        Behavior: Behavior normal.        Thought Content: Thought content normal.    Assessment and Plan :   PDMP not reviewed this encounter.  1. Recurrent sinusitis   2. Allergic rhinitis, unspecified seasonality, unspecified trigger   3. Mild persistent asthma without complication    Will start empiric treatment for sinusitis with amoxicillin which he has done well with in the past. Also due to his allergies, asthma, will use an oral prednisone course.  Recommended supportive care otherwise including the use of oral antihistamine, decongestant (following use of the prednisone). Respiratory testing pending. Deferred imaging given clear cardiopulmonary exam, hemodynamically stable vital signs. Counseled patient on potential for adverse effects with medications prescribed/recommended today, ER and return-to-clinic precautions discussed, patient verbalized understanding.    Wallis Bamberg, [place] 09/12/22 [phone]

## 2022-09-11 NOTE — ED Triage Notes (Signed)
3-4 days having sinus pain, nasal congestion (stopped up and runny).

## 2022-09-13 LAB — SARS CORONAVIRUS 2 (TAT 6-24 HRS): SARS Coronavirus 2: NEGATIVE

## 2023-02-24 ENCOUNTER — Emergency Department: Payer: Medicaid Other

## 2023-02-24 ENCOUNTER — Other Ambulatory Visit: Payer: Self-pay

## 2023-02-24 ENCOUNTER — Encounter: Payer: Self-pay | Admitting: *Deleted

## 2023-02-24 ENCOUNTER — Emergency Department
Admission: EM | Admit: 2023-02-24 | Discharge: 2023-02-25 | Disposition: A | Discharge status: Home / Self Care | Payer: Medicaid Other | Attending: Emergency Medicine | Admitting: Emergency Medicine

## 2023-02-24 DIAGNOSIS — R059 Cough, unspecified: Secondary | ICD-10-CM | POA: Diagnosis not present

## 2023-02-24 DIAGNOSIS — Z20822 Contact with and (suspected) exposure to covid-19: Secondary | ICD-10-CM | POA: Diagnosis not present

## 2023-02-24 DIAGNOSIS — J45909 Unspecified asthma, uncomplicated: Secondary | ICD-10-CM | POA: Diagnosis not present

## 2023-02-24 DIAGNOSIS — K529 Noninfective gastroenteritis and colitis, unspecified: Secondary | ICD-10-CM | POA: Diagnosis not present

## 2023-02-24 DIAGNOSIS — R509 Fever, unspecified: Secondary | ICD-10-CM | POA: Diagnosis not present

## 2023-02-24 DIAGNOSIS — J069 Acute upper respiratory infection, unspecified: Secondary | ICD-10-CM | POA: Diagnosis not present

## 2023-02-24 DIAGNOSIS — B349 Viral infection, unspecified: Secondary | ICD-10-CM | POA: Diagnosis not present

## 2023-02-24 DIAGNOSIS — R000 Tachycardia, unspecified: Secondary | ICD-10-CM | POA: Diagnosis not present

## 2023-02-24 LAB — CBC
HCT: 53.7 % — ABNORMAL HIGH (ref 39.0–52.0)
Hemoglobin: 17.3 g/dL — ABNORMAL HIGH (ref 13.0–17.0)
MCH: 30 pg (ref 26.0–34.0)
MCHC: 32.2 g/dL (ref 30.0–36.0)
MCV: 93.1 fL (ref 80.0–100.0)
Platelets: 334 10*3/uL (ref 150–400)
RBC: 5.77 MIL/uL (ref 4.22–5.81)
RDW: 12.2 % (ref 11.5–15.5)
WBC: 9.2 10*3/uL (ref 4.0–10.5)
nRBC: 0 % (ref 0.0–0.2)

## 2023-02-24 LAB — COMPREHENSIVE METABOLIC PANEL
ALT: 19 U/L (ref 0–44)
AST: 26 U/L (ref 15–41)
Albumin: 3.4 g/dL — ABNORMAL LOW (ref 3.5–5.0)
Alkaline Phosphatase: 72 U/L (ref 38–126)
Anion gap: 10 (ref 5–15)
BUN: 11 mg/dL (ref 6–20)
CO2: 27 mmol/L (ref 22–32)
Calcium: 7.9 mg/dL — ABNORMAL LOW (ref 8.9–10.3)
Chloride: 97 mmol/L — ABNORMAL LOW (ref 98–111)
Creatinine, Ser: 1.02 mg/dL (ref 0.61–1.24)
GFR, Estimated: >60 mL/min (ref >60)
Glucose, Bld: 100 mg/dL — ABNORMAL HIGH (ref 70–99)
Potassium: 3.6 mmol/L (ref 3.5–5.1)
Sodium: 134 mmol/L — ABNORMAL LOW (ref 135–145)
Total Bilirubin: 0.3 mg/dL (ref 0.3–1.2)
Total Protein: 6.7 g/dL (ref 6.5–8.1)

## 2023-02-24 LAB — GROUP A STREP BY PCR: Group A Strep by PCR: NOT DETECTED

## 2023-02-24 LAB — RESP PANEL BY RT-PCR (RSV, FLU A&B, COVID)  RVPGX2
Influenza A by PCR: NEGATIVE
Influenza B by PCR: NEGATIVE
Resp Syncytial Virus by PCR: NEGATIVE
SARS Coronavirus 2 by RT PCR: NEGATIVE

## 2023-02-24 MED ORDER — KETOROLAC TROMETHAMINE 30 MG/ML IJ SOLN
30.0000 mg | Freq: Once | INTRAMUSCULAR | Status: DC
  Filled 2023-02-24: qty 1

## 2023-02-24 MED ORDER — ONDANSETRON 4 MG PO TBDP: 4.0000 mg | ORAL_TABLET | Freq: Three times a day (TID) | ORAL | 0 refills | Status: DC | PRN

## 2023-02-24 MED ORDER — SODIUM CHLORIDE 0.9 % IV BOLUS
1000.0000 mL | Freq: Once | INTRAVENOUS | Status: AC
  Administered 2023-02-24: 1000 mL via INTRAVENOUS

## 2023-02-24 MED ORDER — ACETAMINOPHEN 500 MG PO TABS
1000.0000 mg | ORAL_TABLET | Freq: Once | ORAL | Status: AC
  Administered 2023-02-24: 1000 mg via ORAL
  Filled 2023-02-24: qty 2

## 2023-02-24 MED ORDER — ONDANSETRON HCL 4 MG/2ML IJ SOLN
4.0000 mg | Freq: Once | INTRAMUSCULAR | Status: AC
  Administered 2023-02-24: 4 mg via INTRAVENOUS
  Filled 2023-02-24: qty 2

## 2023-02-24 NOTE — ED Triage Notes (Addendum)
Pt reports cough, runny nose, cough and fever for 2 days.   Pt reports vomiting and diarrhea yesterday with abd pain.     Pt also has a sore throat.  Pt alert

## 2023-02-24 NOTE — Discharge Instructions (Addendum)
You may take Zofran as needed for nausea/vomiting.  Clear liquids x 12 hours, then bland diet x 3 days, then slowly advance diet as tolerated.  Return to the ER for worsening symptoms, persistent vomiting, difficulty breathing or other concerns.

## 2023-02-24 NOTE — ED Provider Notes (Addendum)
[facility] Provider Note    Event Date/Time   First MD Initiated Contact with Patient 02/24/23 2215     (approximate)  History   Chief Complaint: Fever  HPI  Joel York is a 37 y.o. male with a past medical history of ADHD, asthma, presents emergency department for nausea vomiting diarrhea cough congestion fever.  According to the patient for the past 2 to 3 days he has been nauseated with frequent episodes of vomiting and diarrhea.  States he has not eaten or drinking pretty much anything in 2 days.  He states for the past 24 to 48 hours he has also been very congested with frequent cough and now tonight developed a fever.  Patient afebrile in the emergency department is quite tachycardic at 140 bpm.  Normal respiratory rate, satting 97% on room air.  Physical Exam   Triage Vital Signs: ED Triage Vitals  Enc Vitals Group     BP 02/24/23 2155 (!) 129/104     Pulse Rate 02/24/23 2155 (!) 140     Resp 02/24/23 2155 18     Temp 02/24/23 2155 98.7 F (37.1 C)     Temp Source 02/24/23 2155 Oral     SpO2 02/24/23 2155 96 %     Weight 02/24/23 2156 153 lb (69.4 kg)     Height 02/24/23 2156 '5\' 7"'$  (1.702 m)     Head Circumference --      Peak Flow --      Pain Score 02/24/23 2156 0     Pain Loc --      Pain Edu? --      Excl. in [place]? --     Most recent vital signs: Vitals:   02/24/23 2155 02/24/23 2216  BP: (!) 129/104   Pulse: (!) 140 (!) 131  Resp: 18 18  Temp: 98.7 F (37.1 C) 98.7 F (37.1 C)  SpO2: 96% 97%    General: Awake, no distress.  Occasional cough during exam. CV:  Good peripheral perfusion.  Regular rhythm rate around 130 bpm. Resp:  Normal effort.  Equal breath sounds bilaterally.  Abd:  No distention.  Soft, nontender.  No rebound or guarding.  Benign abdomen.   ED Results / Procedures / Treatments   EKG  EKG viewed and interpreted by myself shows sinus tachycardia 136 bpm with a narrow QRS, left axis deviation, normal  intervals, difficult but no concerning ST changes.  RADIOLOGY  I have reviewed and interpreted the chest x-ray images.  No obvious pneumonia seen on my evaluation however the patient does have what appears to be hilar lymphadenopathy.  Awaiting radiology read. Chest x-ray read as negative by radiology  MEDICATIONS ORDERED IN ED: Medications  sodium chloride 0.9 % bolus 1,000 mL (has no administration in time range)  ketorolac (TORADOL) 30 MG/ML injection 30 mg (has no administration in time range)  sodium chloride 0.9 % bolus 1,000 mL (1,000 mLs Intravenous New Bag/Given 02/24/23 2227)  acetaminophen (TYLENOL) tablet 1,000 mg (1,000 mg Oral Given 02/24/23 2222)  ondansetron (ZOFRAN) injection 4 mg (4 mg Intravenous Given 02/24/23 2224)     IMPRESSION / MDM / ASSESSMENT AND PLAN / ED COURSE  I reviewed the triage vital signs and the nursing notes.  Patient's presentation is most consistent with acute presentation with potential threat to life or bodily function.  Patient presents emergency department for fever cough congestion nausea vomiting diarrhea over the past 2 days.  Overall the patient appears well,  no distress.  He is tachycardic at 130 bpm.  States he feels dehydrated.  We will begin IV hydration we will treat with Tylenol and Zofran.  We will check labs we will also obtain a flu/COVID/RSV swab and a chest x-ray to rule out pneumonia.  Differential would include infectious etiologies such as pneumonia COVID or influenza, electrolyte or metabolic abnormality, dehydration.  Patient's labs have resulted showing a normal white blood cell count but an elevated hemoglobin likely hemoconcentrated.  Chemistry shows no significant findings.  Strep test is negative.  Patient receiving IV fluids.    Patient's COVID/flu/RSV is negative.  Lab work overall reassuring.  Patient has received 1 L of fluid heart rate around 115 bpm.  Will infuse a second liter.  Highly suspect more of a viral  gastroenteritis.  Patient states his significant other and child had similar symptoms last week.  FINAL CLINICAL IMPRESSION(S) / ED DIAGNOSES   Gastroenteritis Viral infection  Note:  This document was prepared using Dragon voice recognition software and may include unintentional dictation errors.   Harvest Dark, MD 02/24/23 [id number]    Harvest Dark, MD 02/24/23 2311

## 2023-02-24 NOTE — ED Notes (Addendum)
See triage note. Pt reports yesterday and today having a fever, cough, congestion. Yesterday also had N/V/D but not today. Pt A&Ox4. HR tachy in 120-140s. Mom at bedside. Pt does not prefer heavy pain medicine.

## 2023-02-25 NOTE — ED Provider Notes (Signed)
-----------------------------------------   12:46 AM on 02/25/2023 -----------------------------------------   Heart rate 101 after IV fluids.  Patient feeling significantly better.  Will discharge home per previous providers plan.  Strict return precautions given.  Patient verbalizes understanding and agrees with plan of care.   Paulette Blanch, MD 02/25/23 0500

## 2023-02-25 NOTE — ED Notes (Signed)
Pt Dc to home. Dc instructions reviewed with all questions answered. Pt voices understanding. IV removed, cath intact, pressure dressing applied. No bleeding noted at site. Pt assisted out of dept via wheelchair with family member

## 2023-03-12 ENCOUNTER — Ambulatory Visit
Admission: EM | Admit: 2023-03-12 | Discharge: 2023-03-12 | Disposition: A | Discharge status: Home / Self Care | Payer: Medicaid Other | Attending: Urgent Care | Admitting: Urgent Care

## 2023-03-12 DIAGNOSIS — J329 Chronic sinusitis, unspecified: Secondary | ICD-10-CM | POA: Diagnosis not present

## 2023-03-12 DIAGNOSIS — J309 Allergic rhinitis, unspecified: Secondary | ICD-10-CM | POA: Diagnosis not present

## 2023-03-12 MED ORDER — AMOXICILLIN-POT CLAVULANATE 875-125 MG PO TABS: 1.0000 | ORAL_TABLET | Freq: Two times a day (BID) | ORAL | 0 refills | Status: DC

## 2023-03-12 MED ORDER — LEVOCETIRIZINE DIHYDROCHLORIDE 5 MG PO TABS: 5.0000 mg | ORAL_TABLET | Freq: Every evening | ORAL | 0 refills | Status: DC

## 2023-03-12 MED ORDER — PREDNISONE 50 MG PO TABS: 50.0000 mg | ORAL_TABLET | Freq: Every day | ORAL | 0 refills | Status: DC

## 2023-03-12 NOTE — ED Triage Notes (Signed)
Pt states congestion and facial pressure  for the past week. Has been taking tylenol and motrin for the pain.

## 2023-03-12 NOTE — ED Provider Notes (Signed)
[facility]  Note:  This document was prepared using Systems analyst and may include unintentional dictation errors.  MRN: [id number] DOB: May 17, 1986  Subjective:   Joel York is a 37 y.o. male presenting for 10-day history of acute onset recurrent sinus pain, sinus pressure, bilateral ear fullness, sinus pain.  Has a history of significant allergic rhinitis and asthma.  He was recently seen 2 weeks ago and was advised supportive care for viral illness.  Has a history of asthma but does not feel any particular chest pain, shortness of breath or wheezing.  No current facility-administered medications for this encounter.  Current Outpatient Medications:    albuterol (PROVENTIL HFA;VENTOLIN HFA) 108 (90 Base) MCG/ACT inhaler, Inhale 2 puffs into the lungs every 6 (six) hours as needed for wheezing or shortness of breath. Shortness of breath, Disp: 1 Inhaler, Rfl: 0   amoxicillin (AMOXIL) 875 MG tablet, Take 1 tablet (875 mg total) by mouth 2 (two) times daily., Disp: 14 tablet, Rfl: 0   calcium carbonate (TUMS - DOSED IN MG ELEMENTAL CALCIUM) 500 MG chewable tablet, Chew 1 tablet by mouth daily as needed. Upset stomach, Disp: , Rfl:    Fluticasone-Salmeterol (ADVAIR) 100-50 MCG/DOSE AEPB, Inhale 1 puff into the lungs every 12 (twelve) hours., Disp: 60 each, Rfl: 1   HYDROcodone-acetaminophen (NORCO/VICODIN) 5-325 MG tablet, Take 1-2 tablets by mouth every 4 (four) hours as needed., Disp: 20 tablet, Rfl: 0   ipratropium (ATROVENT) 0.06 % nasal spray, Place 2 sprays into both nostrils 4 (four) times daily., Disp: 15 mL, Rfl: 1   levocetirizine (XYZAL) 5 MG tablet, Take 1 tablet (5 mg total) by mouth every evening., Disp: 90 tablet, Rfl: 0   montelukast (SINGULAIR) 10 MG tablet, Take 1 tablet (10 mg total) by mouth every morning., Disp: 30 tablet, Rfl: 3   ondansetron (ZOFRAN-ODT) 4 MG disintegrating tablet, Take 1 tablet (4 mg total) by mouth every 8  (eight) hours as needed for nausea or vomiting., Disp: 20 tablet, Rfl: 0   penicillin v potassium (VEETID) 500 MG tablet, Take 1 tablet (500 mg total) by mouth 3 (three) times daily., Disp: 30 tablet, Rfl: 0   predniSONE (DELTASONE) 50 MG tablet, Take 1 tablet (50 mg total) by mouth daily with breakfast., Disp: 3 tablet, Rfl: 0   Allergies  Allergen Reactions   Other Anaphylaxis    Tree Nuts   Peanuts [Peanut Oil]     Past Medical History:  Diagnosis Date   Acid reflux    ADHD (attention deficit hyperactivity disorder)    Asthma    Back pain    Seizures (HCC)    Resolved     Past Surgical History:  Procedure Laterality Date   CHOLECYSTECTOMY     WISDOM TOOTH EXTRACTION      Family History  Problem Relation Age of Onset   Crohn's disease Maternal Grandmother    Crohn's disease Maternal Uncle    Hypertension Mother        Living   Congestive Heart Failure Maternal Grandmother    Hypertension Other        Maternal Aunts & Uncles    Social History   Tobacco Use   Smoking status: Light Smoker   Smokeless tobacco: Current    Types: Chew  Substance Use Topics   Alcohol use: Not Currently   Drug use: Not Currently    Comment: last use "many years ago"    ROS   Objective:  Vitals: BP (!) 156/84 (BP Location: Left Arm)   Pulse (!) 113   Temp 98.4 F (36.9 C) (Oral)   Resp 16   SpO2 95%   Physical Exam Constitutional:      General: He is not in acute distress.    Appearance: Normal appearance. He is well-developed and normal weight. He is not ill-appearing, toxic-appearing or diaphoretic.  HENT:     Head: Normocephalic and atraumatic.     Right Ear: Ear canal and external ear normal. No drainage, swelling or tenderness. No middle ear effusion. There is no impacted cerumen. Tympanic membrane is not erythematous or bulging.     Left Ear: Ear canal and external ear normal. No drainage, swelling or tenderness.  No middle ear effusion. There is no impacted  cerumen. Tympanic membrane is not erythematous or bulging.     Ears:     Comments: Air-fluid bilaterally.  TMs without erythema and are intact otherwise.    Nose: Congestion and rhinorrhea present.     Mouth/Throat:     Mouth: Mucous membranes are moist.     Pharynx: No oropharyngeal exudate or posterior oropharyngeal erythema.  Eyes:     General: No scleral icterus.       Right eye: No discharge.        Left eye: No discharge.     Extraocular Movements: Extraocular movements intact.     Conjunctiva/sclera: Conjunctivae normal.  Cardiovascular:     Rate and Rhythm: Normal rate and regular rhythm.     Heart sounds: Normal heart sounds. No murmur heard.    No friction rub. No gallop.  Pulmonary:     Effort: Pulmonary effort is normal. No respiratory distress.     Breath sounds: Normal breath sounds. No stridor. No wheezing, rhonchi or rales.  Musculoskeletal:     Cervical back: Normal range of motion and neck supple. No rigidity. No muscular tenderness.  Neurological:     General: No focal deficit present.     Mental Status: He is alert and oriented to person, place, and time.  Psychiatric:        Mood and Affect: Mood normal.        Behavior: Behavior normal.        Thought Content: Thought content normal.     Assessment and Plan :   PDMP not reviewed this encounter.  1. Recurrent sinusitis   2. Allergic rhinitis, unspecified seasonality, unspecified trigger     Will manage for recurrent sinusitis with Augmentin.  Recommended oral prednisone course in the context of his allergic rhinitis and asthma. Use supportive care otherwise, hold Flonase.  Avoid decongestants for now.  Push fluids.  Counseled patient on potential for adverse effects with medications prescribed/recommended today, ER and return-to-clinic precautions discussed, patient verbalized understanding.    Jaynee Eagles, [place] 03/12/23 [id number]

## 2023-04-30 ENCOUNTER — Other Ambulatory Visit: Payer: Self-pay

## 2023-04-30 ENCOUNTER — Ambulatory Visit
Admission: RE | Admit: 2023-04-30 | Discharge: 2023-04-30 | Disposition: A | Discharge status: Home / Self Care | Payer: Medicaid Other | Source: Ambulatory Visit | Attending: Nurse Practitioner | Admitting: Nurse Practitioner

## 2023-04-30 VITALS — BP 147/88 | HR 64 | Temp 98.7°F | Resp 17

## 2023-04-30 DIAGNOSIS — J0101 Acute recurrent maxillary sinusitis: Secondary | ICD-10-CM

## 2023-04-30 MED ORDER — FLUTICASONE PROPIONATE 50 MCG/ACT NA SUSP: 1.0000 | Freq: Every day | NASAL | 0 refills | Status: DC

## 2023-04-30 MED ORDER — AMOXICILLIN-POT CLAVULANATE 875-125 MG PO TABS: 1.0000 | ORAL_TABLET | Freq: Two times a day (BID) | ORAL | 0 refills | Status: AC

## 2023-04-30 MED ORDER — PREDNISONE 20 MG PO TABS: 40.0000 mg | ORAL_TABLET | Freq: Every day | ORAL | 0 refills | Status: AC

## 2023-04-30 NOTE — ED Provider Notes (Signed)
[facility]    CSN: [id number] Arrival date & time: 04/30/23  4401      History   Chief Complaint Chief Complaint  Patient presents with   Facial Pain   Nasal Congestion   Cough    HPI Joel York is a 37 y.o. male  presents for evaluation of URI symptoms for 5 days. Patient reports associated symptoms of sinus pressure/pain with yellow/green discharge, runny nose, low-grade fevers of 99.  He also reports he has got a couple of "blood clots". Denies N/V/D,ST,  cough, ear pain, shortness of breath. Patient does not have a hx of asthma or smoking. No known sick contacts.  Reports history of recurrent sinus infection and was told by ENT that he would need surgery in order for them to resolve.  Pt has taken Veneta Penton and allergy medicine OTC for symptoms. Pt has no other concerns at this time.    Cough Associated symptoms: fever and rhinorrhea     Past Medical History:  Diagnosis Date   Acid reflux    ADHD (attention deficit hyperactivity disorder)    Asthma    Back pain    Seizures (HCC)    Resolved    Patient Active Problem List   Diagnosis Date Noted   ADD (attention deficit disorder) 08/29/2015   Visit for preventive health examination 02/21/2015   Mild persistent asthma 02/14/2015   Attention deficit disorder 01/24/2011   SEIZURE DISORDER 09/14/2010   ANXIETY STATE, UNSPECIFIED 11/06/2009   BICEPS TENDINITIS, LEFT 11/22/2008   ASTHMA 09/26/2008    Past Surgical History:  Procedure Laterality Date   CHOLECYSTECTOMY     WISDOM TOOTH EXTRACTION         Home Medications    Prior to Admission medications   Medication Sig Start Date End Date Taking? Authorizing Provider  amoxicillin-clavulanate (AUGMENTIN) 875-125 MG tablet Take 1 tablet by mouth every 12 (twelve) hours for 10 days. 05/02/23 05/12/23 Yes Radford Pax, NP  fluticasone (FLONASE) 50 MCG/ACT nasal spray Place 1 spray into both nostrils daily. 04/30/23  Yes Radford Pax, NP  predniSONE  (DELTASONE) 20 MG tablet Take 2 tablets (40 mg total) by mouth daily with breakfast for 5 days. 04/30/23 05/05/23 Yes Radford Pax, NP  albuterol (PROVENTIL HFA;VENTOLIN HFA) 108 (90 Base) MCG/ACT inhaler Inhale 2 puffs into the lungs every 6 (six) hours as needed for wheezing or shortness of breath. Shortness of breath 01/19/16   Waldon Merl, PA-C  calcium carbonate (TUMS - DOSED IN MG ELEMENTAL CALCIUM) 500 MG chewable tablet Chew 1 tablet by mouth daily as needed. Upset stomach    [provider]  Fluticasone-Salmeterol (ADVAIR) 100-50 MCG/DOSE AEPB Inhale 1 puff into the lungs every 12 (twelve) hours. 02/14/15   Waldon Merl, PA-C  HYDROcodone-acetaminophen (NORCO/VICODIN) 5-325 MG tablet Take 1-2 tablets by mouth every 4 (four) hours as needed. 11/01/15   Marlon Pel, PA-C  ipratropium (ATROVENT) 0.06 % nasal spray Place 2 sprays into both nostrils 4 (four) times daily. 09/28/15   Linna Hoff, MD  levocetirizine (XYZAL) 5 MG tablet Take 1 tablet (5 mg total) by mouth every evening. 03/12/23   Wallis Bamberg, PA-C  montelukast (SINGULAIR) 10 MG tablet Take 1 tablet (10 mg total) by mouth every morning. 02/14/15   Waldon Merl, PA-C  ondansetron (ZOFRAN-ODT) 4 MG disintegrating tablet Take 1 tablet (4 mg total) by mouth every 8 (eight) hours as needed for nausea or vomiting. 02/24/23   Paduchowski,  Caryn Bee, MD    Family History Family History  Problem Relation Age of Onset   Crohn's disease Maternal Grandmother    Crohn's disease Maternal Uncle    Hypertension Mother        Living   Congestive Heart Failure Maternal Grandmother    Hypertension Other        Maternal Aunts & Uncles    Social History Social History   Tobacco Use   Smoking status: Light Smoker   Smokeless tobacco: Current    Types: Chew  Substance Use Topics   Alcohol use: Not Currently   Drug use: Not Currently    Comment: last use "many years ago"     Allergies   Other and Peanuts [peanut  oil]   Review of Systems Review of Systems  Constitutional:  Positive for fever.  HENT:  Positive for congestion, rhinorrhea, sinus pressure and sinus pain.      Physical Exam Triage Vital Signs ED Triage Vitals  Enc Vitals Group     BP 04/30/23 0853 (!) 147/88     Pulse Rate 04/30/23 0852 64     Resp 04/30/23 0852 17     Temp 04/30/23 0852 98.7 F (37.1 C)     Temp Source 04/30/23 0852 Oral     SpO2 04/30/23 0852 95 %     Weight --      Height --      Head Circumference --      Peak Flow --      Pain Score 04/30/23 0851 3     Pain Loc --      Pain Edu? --      Excl. in GC? --    No data found.  Updated Vital Signs BP (!) 147/88 (BP Location: Right Arm)   Pulse 64   Temp 98.7 F (37.1 C) (Oral)   Resp 17   SpO2 95%   Visual Acuity Right Eye Distance:   Left Eye Distance:   Bilateral Distance:    Right Eye Near:   Left Eye Near:    Bilateral Near:     Physical Exam Vitals and nursing note reviewed.  Constitutional:      General: He is not in acute distress.    Appearance: Normal appearance. He is not ill-appearing or toxic-appearing.  HENT:     Head: Normocephalic and atraumatic.     Right Ear: Tympanic membrane and ear canal normal.     Left Ear: Tympanic membrane and ear canal normal.     Nose: Mucosal edema and congestion present.     Right Turbinates: Pale.     Left Turbinates: Pale.     Right Sinus: No maxillary sinus tenderness or frontal sinus tenderness.     Left Sinus: Maxillary sinus tenderness and frontal sinus tenderness present.     Mouth/Throat:     Mouth: Mucous membranes are moist.     Pharynx: No oropharyngeal exudate or posterior oropharyngeal erythema.  Eyes:     Pupils: Pupils are equal, round, and reactive to light.  Cardiovascular:     Rate and Rhythm: Normal rate and regular rhythm.     Heart sounds: Normal heart sounds.  Pulmonary:     Effort: Pulmonary effort is normal.     Breath sounds: Normal breath sounds.   Musculoskeletal:     Cervical back: Normal range of motion and neck supple.  Lymphadenopathy:     Cervical: No cervical adenopathy.  Skin:    General: Skin is  warm and dry.  Neurological:     General: No focal deficit present.     Mental Status: He is alert and oriented to person, place, and time.  Psychiatric:        Mood and Affect: Mood normal.        Behavior: Behavior normal.      UC Treatments / Results  Labs (all labs ordered are listed, but only abnormal results are displayed) Labs Reviewed - No data to display   Comprehensive metabolic panel Order: [id number] Status: Final result     Visible to patient: No (inaccessible in MyChart)     Next appt: None   0 Result Notes          Component Ref Range & Units 2 mo ago (02/24/23) 8 yr ago (03/07/15) 8 yr ago (02/21/15) 8 yr ago (02/21/15) 11 yr ago (04/29/12) 12 yr ago (01/24/11) 13 yr ago (10/04/09)  Sodium 135 - 145 mmol/L 134 Low    138 R 138 R 140 R 140 R  Potassium 3.5 - 5.1 mmol/L 3.6   4.4 R 4.0 R 4.0 R 4.2 R  Chloride 98 - 111 mmol/L 97 Low    103 R 100 R 102 R 102 R  CO2 22 - 32 mmol/L 27   33 High  R 28 R 27 R 27 R  Glucose, Bld 70 - 99 mg/dL 409 High    99 83 89 58 Low   Comment: Glucose reference range applies only to samples taken after fasting for at least 8 hours.  BUN 6 - 20 mg/dL 11   15 R 9 R 12 R 11 R  Creatinine, Ser 0.61 - 1.24 mg/dL 8.11   9.14 R 7.82 R 9.56 R 0.92 R  Calcium 8.9 - 10.3 mg/dL 7.9 Low    9.3 R 9.3 R 9.7 R 9.1 R  Total Protein 6.5 - 8.1 g/dL 6.7 7.1 R 7.2 R   7.8 R 7.0 R  Albumin 3.5 - 5.0 g/dL 3.4 Low  4.7 R 4.6 R   5.0 R 4.9 R  AST 15 - 41 U/L 26 18 R 15 R   14 R 12 R  ALT 0 - 44 U/L 19 15 R 13 R   9 R <8 U/L R  Alkaline Phosphatase 38 - 126 U/L 72 72 R 65 R   95 R 71 R  Total Bilirubin 0.3 - 1.2 mg/dL 0.3 0.7 R 1.5 High  R   0.8 0.7  GFR, Estimated >60 mL/min >60        Comment: (NOTE) Calculated using the CKD-EPI Creatinine Equation (2021)  Anion gap 5 - 15 10         Comment: Performed at [facility], [address] [place], [place] [address]  Resulting Agency [facility] [place] [facility]     EKG   Radiology No results found.  Procedures Procedures (including critical care time)  Medications Ordered in UC Medications - No data to display  Initial Impression / Assessment and Plan / UC Course  I have reviewed the triage vital signs and the nursing notes.  Pertinent labs & imaging results that were available during my care of the patient were reviewed by me and considered in my medical decision making (see chart for details).    Reviewed recent labs and progress notes Reviewed exam and symptoms with patient. Start Flonase and prednisone Provisional prescription for Augmentin  provided with instruction for patient not to take if symptoms do not improve or worsen over the next 2 days and he verbalized understanding Continue nasal rinses as tolerated Advise ENT follow-up PCP if symptoms do not improve ER precautions reviewed and patient verbalized understanding Final Clinical Impressions(s) / UC Diagnoses   Final diagnoses:  Acute recurrent maxillary sinusitis     Discharge Instructions      Start Flonase and prednisone as prescribed Provisional prescription for Augmentin has been provided.  Please do not take unless your symptoms do not improve or worsen over the next 2 days Continue nasal rinses as tolerated Follow-up with ENT for further evaluation of your reoccurring sinus infections Please go to emergency room if you develop any worsening symptoms   ED Prescriptions     Medication Sig Dispense Auth. Provider   fluticasone (FLONASE) 50 MCG/ACT nasal spray Place 1 spray into both nostrils daily. 15.8 mL Radford Pax, NP   predniSONE (DELTASONE) 20 MG tablet Take 2 tablets (40 mg total) by mouth daily with breakfast for 5 days. 10 tablet Radford Pax, NP    amoxicillin-clavulanate (AUGMENTIN) 875-125 MG tablet Take 1 tablet by mouth every 12 (twelve) hours for 10 days. 20 tablet Radford Pax, NP      PDMP not reviewed this encounter.   Radford Pax, NP 04/30/23 [phone]

## 2023-04-30 NOTE — ED Triage Notes (Signed)
Pt presents with c/o facial pain, runny nose, yellow and green phlegm X 5 days.  States he has been blowing blood clots from his nose.

## 2023-04-30 NOTE — Discharge Instructions (Signed)
Start Flonase and prednisone as prescribed Provisional prescription for Augmentin has been provided.  Please do not take unless your symptoms do not improve or worsen over the next 2 days Continue nasal rinses as tolerated Follow-up with ENT for further evaluation of your reoccurring sinus infections Please go to emergency room if you develop any worsening symptoms

## 2023-05-27 ENCOUNTER — Ambulatory Visit
Admission: RE | Admit: 2023-05-27 | Discharge: 2023-05-27 | Disposition: A | Discharge status: Home / Self Care | Payer: Medicaid Other | Source: Ambulatory Visit | Attending: Urgent Care | Admitting: Urgent Care

## 2023-05-27 VITALS — BP 122/79 | HR 63 | Temp 98.3°F | Resp 16

## 2023-05-27 DIAGNOSIS — J329 Chronic sinusitis, unspecified: Secondary | ICD-10-CM

## 2023-05-27 MED ORDER — AZELASTINE HCL 0.1 % NA SOLN: 1.0000 | Freq: Two times a day (BID) | NASAL | 0 refills | Status: DC

## 2023-05-27 MED ORDER — IPRATROPIUM BROMIDE 0.03 % NA SOLN: 2.0000 | Freq: Two times a day (BID) | NASAL | 0 refills | Status: DC

## 2023-05-27 MED ORDER — PREDNISONE 20 MG PO TABS: ORAL_TABLET | ORAL | 0 refills | Status: DC

## 2023-05-27 MED ORDER — CEFDINIR 300 MG PO CAPS: 300.0000 mg | ORAL_CAPSULE | Freq: Two times a day (BID) | ORAL | 0 refills | Status: DC

## 2023-05-27 NOTE — ED Triage Notes (Signed)
Pt presents with c/o reoccurring sinus infection and dehydration.  C/o cough and facial pain x 10 days. Has been exposed to sick family members. Has taken Advil, AM/PM cold medicine with no relief.

## 2023-05-27 NOTE — Discharge Instructions (Signed)
Hold the Flonase for now. You can instead use Atrovent and/or Azelastine nasal sprays. If you experience any kind of nasal bleeding then stop all nasal sprays. You can do one more trial of an antibiotic in cefdinir. Use this together with prednisone. Make sure you follow up with your ENT specialist.

## 2023-05-27 NOTE — ED Provider Notes (Signed)
[facility]  Note:  This document was prepared using Conservation officer, historic buildings and may include unintentional dictation errors.  MRN: [id number] DOB: 04-Feb-1986  Subjective:   Joel York is a 37 y.o. male presenting for 10-day history of recurrent sinus pain, facial pain over the maxillary areas, coughing, malaise, fatigue.  Patient has a history of chronic sinusitis, recurrent sinus infections.  Has previously been seen by an ENT specialist and recommended to have surgical intervention.  Unfortunately, the patient has not been able to prepare for this.  He has undergone multiple rounds of antibiotics this year the last of which was the 8th of May, took Augmentin.  No shortness of breath, wheezing, chest pain.  No current facility-administered medications for this encounter.  Current Outpatient Medications:    albuterol (PROVENTIL HFA;VENTOLIN HFA) 108 (90 Base) MCG/ACT inhaler, Inhale 2 puffs into the lungs every 6 (six) hours as needed for wheezing or shortness of breath. Shortness of breath, Disp: 1 Inhaler, Rfl: 0   calcium carbonate (TUMS - DOSED IN MG ELEMENTAL CALCIUM) 500 MG chewable tablet, Chew 1 tablet by mouth daily as needed. Upset stomach, Disp: , Rfl:    fluticasone (FLONASE) 50 MCG/ACT nasal spray, Place 1 spray into both nostrils daily., Disp: 15.8 mL, Rfl: 0   Fluticasone-Salmeterol (ADVAIR) 100-50 MCG/DOSE AEPB, Inhale 1 puff into the lungs every 12 (twelve) hours., Disp: 60 each, Rfl: 1   HYDROcodone-acetaminophen (NORCO/VICODIN) 5-325 MG tablet, Take 1-2 tablets by mouth every 4 (four) hours as needed., Disp: 20 tablet, Rfl: 0   ipratropium (ATROVENT) 0.06 % nasal spray, Place 2 sprays into both nostrils 4 (four) times daily., Disp: 15 mL, Rfl: 1   levocetirizine (XYZAL) 5 MG tablet, Take 1 tablet (5 mg total) by mouth every evening., Disp: 90 tablet, Rfl: 0   montelukast (SINGULAIR) 10 MG tablet, Take 1 tablet (10 mg total) by mouth every  morning., Disp: 30 tablet, Rfl: 3   ondansetron (ZOFRAN-ODT) 4 MG disintegrating tablet, Take 1 tablet (4 mg total) by mouth every 8 (eight) hours as needed for nausea or vomiting., Disp: 20 tablet, Rfl: 0   Allergies  Allergen Reactions   Other Anaphylaxis    Tree Nuts   Peanuts [Peanut Oil]     Past Medical History:  Diagnosis Date   Acid reflux    ADHD (attention deficit hyperactivity disorder)    Asthma    Back pain    Seizures (HCC)    Resolved     Past Surgical History:  Procedure Laterality Date   CHOLECYSTECTOMY     WISDOM TOOTH EXTRACTION      Family History  Problem Relation Age of Onset   Crohn's disease Maternal Grandmother    Crohn's disease Maternal Uncle    Hypertension Mother        Living   Congestive Heart Failure Maternal Grandmother    Hypertension Other        Maternal Aunts & Uncles    Social History   Tobacco Use   Smoking status: Light Smoker   Smokeless tobacco: Current    Types: Chew  Substance Use Topics   Alcohol use: Not Currently   Drug use: Not Currently    Comment: last use "many years ago"    ROS   Objective:   Vitals: BP 122/79 (BP Location: Right Arm)   Pulse 63   Temp 98.3 F (36.8 C) (Oral)   Resp 16   SpO2 95%   Physical Exam  Constitutional:      General: He is not in acute distress.    Appearance: Normal appearance. He is well-developed and normal weight. He is not ill-appearing, toxic-appearing or diaphoretic.  HENT:     Head: Normocephalic and atraumatic.     Right Ear: Tympanic membrane, ear canal and external ear normal. No drainage, swelling or tenderness. No middle ear effusion. There is no impacted cerumen. Tympanic membrane is not erythematous or bulging.     Left Ear: Tympanic membrane, ear canal and external ear normal. No drainage, swelling or tenderness.  No middle ear effusion. There is no impacted cerumen. Tympanic membrane is not erythematous or bulging.     Nose: Nasal tenderness, mucosal  edema and congestion present. No rhinorrhea.     Right Turbinates: Swollen.     Left Turbinates: Swollen.     Right Sinus: Maxillary sinus tenderness present.     Left Sinus: Maxillary sinus tenderness present.     Mouth/Throat:     Mouth: Mucous membranes are moist.     Pharynx: No oropharyngeal exudate or posterior oropharyngeal erythema.  Eyes:     General: No scleral icterus.       Right eye: No discharge.        Left eye: No discharge.     Extraocular Movements: Extraocular movements intact.     Conjunctiva/sclera: Conjunctivae normal.  Cardiovascular:     Rate and Rhythm: Normal rate and regular rhythm.     Heart sounds: Normal heart sounds. No murmur heard.    No friction rub. No gallop.  Pulmonary:     Effort: Pulmonary effort is normal. No respiratory distress.     Breath sounds: Normal breath sounds. No stridor. No wheezing, rhonchi or rales.  Musculoskeletal:     Cervical back: Normal range of motion and neck supple. No rigidity. No muscular tenderness.  Skin:    General: Skin is warm and dry.  Neurological:     General: No focal deficit present.     Mental Status: He is alert and oriented to person, place, and time.  Psychiatric:        Mood and Affect: Mood normal.        Behavior: Behavior normal.        Thought Content: Thought content normal.     Assessment and Plan :   PDMP not reviewed this encounter.  1. Chronic sinusitis, unspecified location   2. Recurrent sinusitis    Discussed antibiotic overuse.  Emphasized need to follow-up with his ENT specialist so as to avoid complications from regular antibiotic use and developing antibiotic resistance.  Patient was receptive to this.  I did provide him with a prescription for cefdinir for an acute on chronic sinus infection and emphasized need to take his sinus medications regularly.  Patient does better with steroids as well and therefore recommended prednisone.  Will have him use Atrovent and Astelin nasal  sprays instead of Flonase for now.  Deferred imaging given clear cardiopulmonary exam, hemodynamically stable vital signs. Counseled patient on potential for adverse effects with medications prescribed/recommended today, ER and return-to-clinic precautions discussed, patient verbalized understanding.    Wallis Bamberg, [place] 05/30/23 [phone]

## 2023-07-14 ENCOUNTER — Inpatient Hospital Stay (HOSPITAL_COMMUNITY): Payer: Medicaid Other

## 2023-07-14 ENCOUNTER — Other Ambulatory Visit (HOSPITAL_COMMUNITY): Payer: Self-pay

## 2023-07-14 ENCOUNTER — Inpatient Hospital Stay (HOSPITAL_COMMUNITY)
Admission: EM | Admit: 2023-07-14 | Discharge: 2023-08-08 | DRG: 001 | Disposition: A | Discharge status: Home / Self Care | Payer: Medicaid Other | Attending: Cardiothoracic Surgery | Admitting: Cardiothoracic Surgery

## 2023-07-14 ENCOUNTER — Emergency Department (HOSPITAL_COMMUNITY): Payer: Medicaid Other

## 2023-07-14 ENCOUNTER — Encounter (HOSPITAL_COMMUNITY): Payer: Self-pay

## 2023-07-14 ENCOUNTER — Telehealth: Payer: Self-pay

## 2023-07-14 ENCOUNTER — Other Ambulatory Visit: Payer: Self-pay

## 2023-07-14 ENCOUNTER — Telehealth (HOSPITAL_COMMUNITY): Payer: Self-pay | Admitting: Pharmacy Technician

## 2023-07-14 DIAGNOSIS — J45909 Unspecified asthma, uncomplicated: Secondary | ICD-10-CM | POA: Diagnosis present

## 2023-07-14 DIAGNOSIS — R0683 Snoring: Secondary | ICD-10-CM | POA: Diagnosis present

## 2023-07-14 DIAGNOSIS — Z7951 Long term (current) use of inhaled steroids: Secondary | ICD-10-CM

## 2023-07-14 DIAGNOSIS — J918 Pleural effusion in other conditions classified elsewhere: Secondary | ICD-10-CM | POA: Diagnosis not present

## 2023-07-14 DIAGNOSIS — F17210 Nicotine dependence, cigarettes, uncomplicated: Secondary | ICD-10-CM | POA: Diagnosis present

## 2023-07-14 DIAGNOSIS — E875 Hyperkalemia: Secondary | ICD-10-CM | POA: Diagnosis not present

## 2023-07-14 DIAGNOSIS — R7401 Elevation of levels of liver transaminase levels: Secondary | ICD-10-CM | POA: Diagnosis not present

## 2023-07-14 DIAGNOSIS — J9811 Atelectasis: Secondary | ICD-10-CM | POA: Diagnosis not present

## 2023-07-14 DIAGNOSIS — R570 Cardiogenic shock: Secondary | ICD-10-CM | POA: Diagnosis not present

## 2023-07-14 DIAGNOSIS — N179 Acute kidney failure, unspecified: Secondary | ICD-10-CM | POA: Diagnosis present

## 2023-07-14 DIAGNOSIS — E876 Hypokalemia: Secondary | ICD-10-CM | POA: Diagnosis not present

## 2023-07-14 DIAGNOSIS — G478 Other sleep disorders: Secondary | ICD-10-CM | POA: Diagnosis not present

## 2023-07-14 DIAGNOSIS — D72829 Elevated white blood cell count, unspecified: Secondary | ICD-10-CM | POA: Diagnosis not present

## 2023-07-14 DIAGNOSIS — J810 Acute pulmonary edema: Secondary | ICD-10-CM | POA: Diagnosis not present

## 2023-07-14 DIAGNOSIS — I493 Ventricular premature depolarization: Secondary | ICD-10-CM | POA: Diagnosis not present

## 2023-07-14 DIAGNOSIS — R17 Unspecified jaundice: Secondary | ICD-10-CM | POA: Diagnosis not present

## 2023-07-14 DIAGNOSIS — F1110 Opioid abuse, uncomplicated: Secondary | ICD-10-CM | POA: Diagnosis present

## 2023-07-14 DIAGNOSIS — I110 Hypertensive heart disease with heart failure: Secondary | ICD-10-CM | POA: Diagnosis not present

## 2023-07-14 DIAGNOSIS — R7989 Other specified abnormal findings of blood chemistry: Secondary | ICD-10-CM | POA: Diagnosis not present

## 2023-07-14 DIAGNOSIS — F439 Reaction to severe stress, unspecified: Secondary | ICD-10-CM | POA: Diagnosis present

## 2023-07-14 DIAGNOSIS — K0850 Unsatisfactory restoration of tooth, unspecified: Secondary | ICD-10-CM | POA: Diagnosis not present

## 2023-07-14 DIAGNOSIS — Z7901 Long term (current) use of anticoagulants: Secondary | ICD-10-CM

## 2023-07-14 DIAGNOSIS — R569 Unspecified convulsions: Secondary | ICD-10-CM | POA: Diagnosis present

## 2023-07-14 DIAGNOSIS — E872 Acidosis, unspecified: Secondary | ICD-10-CM | POA: Diagnosis present

## 2023-07-14 DIAGNOSIS — F410 Panic disorder [episodic paroxysmal anxiety] without agoraphobia: Secondary | ICD-10-CM | POA: Diagnosis not present

## 2023-07-14 DIAGNOSIS — I5023 Acute on chronic systolic (congestive) heart failure: Secondary | ICD-10-CM

## 2023-07-14 DIAGNOSIS — I255 Ischemic cardiomyopathy: Secondary | ICD-10-CM | POA: Diagnosis not present

## 2023-07-14 DIAGNOSIS — R0602 Shortness of breath: Secondary | ICD-10-CM | POA: Diagnosis not present

## 2023-07-14 DIAGNOSIS — D75839 Thrombocytosis, unspecified: Secondary | ICD-10-CM | POA: Diagnosis not present

## 2023-07-14 DIAGNOSIS — I5A Non-ischemic myocardial injury (non-traumatic): Secondary | ICD-10-CM

## 2023-07-14 DIAGNOSIS — I2489 Other forms of acute ischemic heart disease: Secondary | ICD-10-CM | POA: Diagnosis present

## 2023-07-14 DIAGNOSIS — I428 Other cardiomyopathies: Secondary | ICD-10-CM | POA: Diagnosis not present

## 2023-07-14 DIAGNOSIS — R079 Chest pain, unspecified: Secondary | ICD-10-CM | POA: Diagnosis not present

## 2023-07-14 DIAGNOSIS — I5021 Acute systolic (congestive) heart failure: Secondary | ICD-10-CM | POA: Diagnosis not present

## 2023-07-14 DIAGNOSIS — R5383 Other fatigue: Secondary | ICD-10-CM | POA: Diagnosis present

## 2023-07-14 DIAGNOSIS — I272 Pulmonary hypertension, unspecified: Secondary | ICD-10-CM | POA: Diagnosis present

## 2023-07-14 DIAGNOSIS — E611 Iron deficiency: Secondary | ICD-10-CM | POA: Diagnosis present

## 2023-07-14 DIAGNOSIS — Z01810 Encounter for preprocedural cardiovascular examination: Secondary | ICD-10-CM | POA: Diagnosis not present

## 2023-07-14 DIAGNOSIS — I509 Heart failure, unspecified: Secondary | ICD-10-CM

## 2023-07-14 DIAGNOSIS — I5082 Biventricular heart failure: Secondary | ICD-10-CM | POA: Diagnosis not present

## 2023-07-14 DIAGNOSIS — Z8249 Family history of ischemic heart disease and other diseases of the circulatory system: Secondary | ICD-10-CM

## 2023-07-14 DIAGNOSIS — Z8709 Personal history of other diseases of the respiratory system: Secondary | ICD-10-CM

## 2023-07-14 DIAGNOSIS — Z79899 Other long term (current) drug therapy: Secondary | ICD-10-CM

## 2023-07-14 DIAGNOSIS — D751 Secondary polycythemia: Secondary | ICD-10-CM | POA: Diagnosis present

## 2023-07-14 DIAGNOSIS — Z1152 Encounter for screening for COVID-19: Secondary | ICD-10-CM | POA: Diagnosis not present

## 2023-07-14 DIAGNOSIS — K5909 Other constipation: Secondary | ICD-10-CM | POA: Diagnosis present

## 2023-07-14 DIAGNOSIS — K219 Gastro-esophageal reflux disease without esophagitis: Secondary | ICD-10-CM | POA: Diagnosis present

## 2023-07-14 DIAGNOSIS — D509 Iron deficiency anemia, unspecified: Secondary | ICD-10-CM | POA: Diagnosis not present

## 2023-07-14 DIAGNOSIS — Z9049 Acquired absence of other specified parts of digestive tract: Secondary | ICD-10-CM

## 2023-07-14 DIAGNOSIS — Z8379 Family history of other diseases of the digestive system: Secondary | ICD-10-CM

## 2023-07-14 DIAGNOSIS — I340 Nonrheumatic mitral (valve) insufficiency: Secondary | ICD-10-CM | POA: Diagnosis not present

## 2023-07-14 DIAGNOSIS — R600 Localized edema: Principal | ICD-10-CM

## 2023-07-14 DIAGNOSIS — Z515 Encounter for palliative care: Secondary | ICD-10-CM

## 2023-07-14 DIAGNOSIS — Z91018 Allergy to other foods: Secondary | ICD-10-CM

## 2023-07-14 DIAGNOSIS — K029 Dental caries, unspecified: Secondary | ICD-10-CM | POA: Diagnosis present

## 2023-07-14 DIAGNOSIS — Z7189 Other specified counseling: Secondary | ICD-10-CM | POA: Diagnosis not present

## 2023-07-14 DIAGNOSIS — J4530 Mild persistent asthma, uncomplicated: Secondary | ICD-10-CM | POA: Diagnosis not present

## 2023-07-14 DIAGNOSIS — F909 Attention-deficit hyperactivity disorder, unspecified type: Secondary | ICD-10-CM | POA: Diagnosis present

## 2023-07-14 DIAGNOSIS — I517 Cardiomegaly: Secondary | ICD-10-CM | POA: Diagnosis not present

## 2023-07-14 DIAGNOSIS — R000 Tachycardia, unspecified: Secondary | ICD-10-CM | POA: Insufficient documentation

## 2023-07-14 DIAGNOSIS — Z95811 Presence of heart assist device: Secondary | ICD-10-CM | POA: Diagnosis not present

## 2023-07-14 DIAGNOSIS — Z87892 Personal history of anaphylaxis: Secondary | ICD-10-CM

## 2023-07-14 DIAGNOSIS — R0989 Other specified symptoms and signs involving the circulatory and respiratory systems: Secondary | ICD-10-CM | POA: Diagnosis not present

## 2023-07-14 LAB — BASIC METABOLIC PANEL
Anion gap: 11 (ref 5–15)
BUN: 12 mg/dL (ref 6–20)
CO2: 28 mmol/L (ref 22–32)
Calcium: 8.7 mg/dL — ABNORMAL LOW (ref 8.9–10.3)
Chloride: 101 mmol/L (ref 98–111)
Creatinine, Ser: 1.8 mg/dL — ABNORMAL HIGH (ref 0.61–1.24)
GFR, Estimated: 49 mL/min — ABNORMAL LOW (ref >60)
Glucose, Bld: 105 mg/dL — ABNORMAL HIGH (ref 70–99)
Potassium: 3.5 mmol/L (ref 3.5–5.1)
Sodium: 140 mmol/L (ref 135–145)

## 2023-07-14 LAB — COMPREHENSIVE METABOLIC PANEL
ALT: 74 U/L — ABNORMAL HIGH (ref 0–44)
ALT: 74 U/L — ABNORMAL HIGH (ref 0–44)
AST: 73 U/L — ABNORMAL HIGH (ref 15–41)
AST: 80 U/L — ABNORMAL HIGH (ref 15–41)
Albumin: 3.5 g/dL (ref 3.5–5.0)
Albumin: 3.5 g/dL (ref 3.5–5.0)
Alkaline Phosphatase: 141 U/L — ABNORMAL HIGH (ref 38–126)
Alkaline Phosphatase: 134 U/L — ABNORMAL HIGH (ref 38–126)
Anion gap: 13 (ref 5–15)
Anion gap: 12 (ref 5–15)
BUN: 12 mg/dL (ref 6–20)
BUN: 12 mg/dL (ref 6–20)
CO2: 19 mmol/L — ABNORMAL LOW (ref 22–32)
CO2: 23 mmol/L (ref 22–32)
Calcium: 8.9 mg/dL (ref 8.9–10.3)
Calcium: 8.5 mg/dL — ABNORMAL LOW (ref 8.9–10.3)
Chloride: 105 mmol/L (ref 98–111)
Chloride: 104 mmol/L (ref 98–111)
Creatinine, Ser: 1.6 mg/dL — ABNORMAL HIGH (ref 0.61–1.24)
Creatinine, Ser: 1.62 mg/dL — ABNORMAL HIGH (ref 0.61–1.24)
GFR, Estimated: 57 mL/min — ABNORMAL LOW (ref >60)
GFR, Estimated: 56 mL/min — ABNORMAL LOW (ref >60)
Glucose, Bld: 109 mg/dL — ABNORMAL HIGH (ref 70–99)
Glucose, Bld: 118 mg/dL — ABNORMAL HIGH (ref 70–99)
Potassium: 3.7 mmol/L (ref 3.5–5.1)
Potassium: 3.9 mmol/L (ref 3.5–5.1)
Sodium: 137 mmol/L (ref 135–145)
Sodium: 139 mmol/L (ref 135–145)
Total Bilirubin: 1.3 mg/dL — ABNORMAL HIGH (ref 0.3–1.2)
Total Bilirubin: 1.6 mg/dL — ABNORMAL HIGH (ref 0.3–1.2)
Total Protein: 6.5 g/dL (ref 6.5–8.1)
Total Protein: 6.2 g/dL — ABNORMAL LOW (ref 6.5–8.1)

## 2023-07-14 LAB — CBC
HCT: 55.9 % — ABNORMAL HIGH (ref 39.0–52.0)
HCT: 55.6 % — ABNORMAL HIGH (ref 39.0–52.0)
Hemoglobin: 18.3 g/dL — ABNORMAL HIGH (ref 13.0–17.0)
Hemoglobin: 18.4 g/dL — ABNORMAL HIGH (ref 13.0–17.0)
MCH: 29.4 pg (ref 26.0–34.0)
MCH: 29.3 pg (ref 26.0–34.0)
MCHC: 32.7 g/dL (ref 30.0–36.0)
MCHC: 33.1 g/dL (ref 30.0–36.0)
MCV: 89.9 fL (ref 80.0–100.0)
MCV: 88.7 fL (ref 80.0–100.0)
Platelets: 271 10*3/uL (ref 150–400)
Platelets: 261 10*3/uL (ref 150–400)
RBC: 6.22 MIL/uL — ABNORMAL HIGH (ref 4.22–5.81)
RBC: 6.27 MIL/uL — ABNORMAL HIGH (ref 4.22–5.81)
RDW: 14.4 % (ref 11.5–15.5)
RDW: 14.5 % (ref 11.5–15.5)
WBC: 12.2 10*3/uL — ABNORMAL HIGH (ref 4.0–10.5)
WBC: 10.1 10*3/uL (ref 4.0–10.5)
nRBC: 0 % (ref 0.0–0.2)
nRBC: 0 % (ref 0.0–0.2)

## 2023-07-14 LAB — RETICULOCYTES
Immature Retic Fract: 16.9 % — ABNORMAL HIGH (ref 2.3–15.9)
RBC.: 6.24 MIL/uL — ABNORMAL HIGH (ref 4.22–5.81)
Retic Count, Absolute: 126 10*3/uL (ref 19.0–186.0)
Retic Ct Pct: 2 % (ref 0.4–3.1)

## 2023-07-14 LAB — TROPONIN I (HIGH SENSITIVITY)
Troponin I (High Sensitivity): 39 ng/L — ABNORMAL HIGH (ref <18)
Troponin I (High Sensitivity): 43 ng/L — ABNORMAL HIGH (ref <18)
Troponin I (High Sensitivity): 37 ng/L — ABNORMAL HIGH (ref <18)
Troponin I (High Sensitivity): 40 ng/L — ABNORMAL HIGH (ref <18)

## 2023-07-14 LAB — ECHOCARDIOGRAM COMPLETE
AR max vel: 3.41 cm2
AV Area VTI: 3.56 cm2
AV Area mean vel: 3.09 cm2
AV Mean grad: 1 mmHg
AV Peak grad: 1.4 mmHg
Ao pk vel: 0.58 m/s
Est EF: <20
MV M vel: 4.12 m/s
MV Peak grad: 67.9 mmHg
S' Lateral: 7.1 cm

## 2023-07-14 LAB — LACTIC ACID, PLASMA: Lactic Acid, Venous: 1.9 mmol/L (ref 0.5–1.9)

## 2023-07-14 LAB — BRAIN NATRIURETIC PEPTIDE
B Natriuretic Peptide: 2494.6 pg/mL — ABNORMAL HIGH (ref 0.0–100.0)
B Natriuretic Peptide: 1810.8 pg/mL — ABNORMAL HIGH (ref 0.0–100.0)

## 2023-07-14 LAB — TSH: TSH: 4.982 u[IU]/mL — ABNORMAL HIGH (ref 0.350–4.500)

## 2023-07-14 LAB — I-STAT VENOUS BLOOD GAS, ED
Acid-Base Excess: 3 mmol/L — ABNORMAL HIGH (ref 0.0–2.0)
Bicarbonate: 23.3 mmol/L (ref 20.0–28.0)
Calcium, Ion: 0.85 mmol/L — CL (ref 1.15–1.40)
HCT: 55 % — ABNORMAL HIGH (ref 39.0–52.0)
Hemoglobin: 18.7 g/dL — ABNORMAL HIGH (ref 13.0–17.0)
O2 Saturation: 100 %
Potassium: 4.4 mmol/L (ref 3.5–5.1)
Sodium: 139 mmol/L (ref 135–145)
TCO2: 24 mmol/L (ref 22–32)
pCO2, Ven: 27 mmHg — ABNORMAL LOW (ref 44–60)
pH, Ven: 7.545 — ABNORMAL HIGH (ref 7.25–7.43)
pO2, Ven: 206 mmHg — ABNORMAL HIGH (ref 32–45)

## 2023-07-14 LAB — FERRITIN: Ferritin: 49 ng/mL (ref 24–336)

## 2023-07-14 LAB — HIV ANTIBODY (ROUTINE TESTING W REFLEX): HIV Screen 4th Generation wRfx: NONREACTIVE

## 2023-07-14 LAB — IRON AND TIBC
Iron: 90 ug/dL (ref 45–182)
Saturation Ratios: 20 % (ref 17.9–39.5)
TIBC: 456 ug/dL — ABNORMAL HIGH (ref 250–450)
UIBC: 366 ug/dL

## 2023-07-14 LAB — COOXEMETRY PANEL
Carboxyhemoglobin: 1.5 % (ref 0.5–1.5)
Methemoglobin: <0.7 % (ref 0.0–1.5)
O2 Saturation: 58.5 %
Total hemoglobin: 18.4 g/dL — ABNORMAL HIGH (ref 12.0–16.0)

## 2023-07-14 LAB — FOLATE: Folate: 18.5 ng/mL (ref >5.9)

## 2023-07-14 LAB — VITAMIN B12: Vitamin B-12: 597 pg/mL (ref 180–914)

## 2023-07-14 LAB — CREATININE, URINE, RANDOM: Creatinine, Urine: <10 mg/dL

## 2023-07-14 LAB — MAGNESIUM: Magnesium: 1.6 mg/dL — ABNORMAL LOW (ref 1.7–2.4)

## 2023-07-14 LAB — T4, FREE: Free T4: 0.79 ng/dL (ref 0.61–1.12)

## 2023-07-14 LAB — SODIUM, URINE, RANDOM: Sodium, Ur: 103 mmol/L

## 2023-07-14 MED ORDER — ONDANSETRON HCL 4 MG/2ML IJ SOLN: 4.0000 mg | Freq: Four times a day (QID) | INTRAMUSCULAR | Status: DC | PRN

## 2023-07-14 MED ORDER — SODIUM CHLORIDE 0.9% FLUSH
10.0000 mL | Freq: Two times a day (BID) | INTRAVENOUS | Status: DC
  Administered 2023-07-15 – 2023-07-18 (×7): 10 mL
  Administered 2023-07-18 – 2023-07-19 (×2): 20 mL
  Administered 2023-07-19: 10 mL
  Administered 2023-07-20: 20 mL
  Administered 2023-07-21 – 2023-07-29 (×13): 10 mL

## 2023-07-14 MED ORDER — ACETAMINOPHEN 325 MG PO TABS: 650.0000 mg | ORAL_TABLET | Freq: Four times a day (QID) | ORAL | Status: DC | PRN

## 2023-07-14 MED ORDER — FUROSEMIDE 10 MG/ML IJ SOLN
40.0000 mg | Freq: Once | INTRAMUSCULAR | Status: AC
  Administered 2023-07-14: 40 mg via INTRAVENOUS
  Filled 2023-07-14: qty 4

## 2023-07-14 MED ORDER — IPRATROPIUM BROMIDE 0.06 % NA SOLN
1.0000 | Freq: Two times a day (BID) | NASAL | Status: DC
  Administered 2023-07-14 – 2023-07-23 (×11): 1 via NASAL
  Filled 2023-07-14: qty 15

## 2023-07-14 MED ORDER — MAGNESIUM SULFATE 4 GM/100ML IV SOLN
4.0000 g | Freq: Once | INTRAVENOUS | Status: AC
  Administered 2023-07-14: 4 g via INTRAVENOUS
  Filled 2023-07-14: qty 100

## 2023-07-14 MED ORDER — BUPRENORPHINE HCL-NALOXONE HCL 8-2 MG SL SUBL
1.0000 | SUBLINGUAL_TABLET | Freq: Every day | SUBLINGUAL | Status: DC
  Administered 2023-07-14 – 2023-07-29 (×15): 1 via SUBLINGUAL
  Filled 2023-07-14 (×16): qty 1

## 2023-07-14 MED ORDER — MEXILETINE HCL 200 MG PO CAPS
200.0000 mg | ORAL_CAPSULE | Freq: Two times a day (BID) | ORAL | Status: DC
  Administered 2023-07-14 – 2023-07-30 (×32): 200 mg via ORAL
  Filled 2023-07-14 (×35): qty 1

## 2023-07-14 MED ORDER — ACETAMINOPHEN 325 MG PO TABS
650.0000 mg | ORAL_TABLET | ORAL | Status: DC | PRN
  Administered 2023-07-14 – 2023-07-25 (×5): 650 mg via ORAL
  Filled 2023-07-14 (×11): qty 2

## 2023-07-14 MED ORDER — SODIUM CHLORIDE 0.9% FLUSH: 3.0000 mL | INTRAVENOUS | Status: DC | PRN

## 2023-07-14 MED ORDER — IPRATROPIUM BROMIDE 0.03 % NA SOLN
2.0000 | Freq: Two times a day (BID) | NASAL | Status: DC
  Filled 2023-07-14: qty 30

## 2023-07-14 MED ORDER — ONDANSETRON HCL 4 MG/2ML IJ SOLN
INTRAMUSCULAR | Status: AC
  Administered 2023-07-14: 4 mg via INTRAVENOUS
  Filled 2023-07-14: qty 2

## 2023-07-14 MED ORDER — AZELASTINE HCL 0.1 % NA SOLN
1.0000 | Freq: Two times a day (BID) | NASAL | Status: DC
  Filled 2023-07-14: qty 30

## 2023-07-14 MED ORDER — FUROSEMIDE 10 MG/ML IJ SOLN
20.0000 mg | Freq: Four times a day (QID) | INTRAMUSCULAR | Status: DC
  Administered 2023-07-14 (×2): 20 mg via INTRAVENOUS
  Filled 2023-07-14 (×2): qty 2

## 2023-07-14 MED ORDER — SODIUM CHLORIDE 0.9% FLUSH
3.0000 mL | Freq: Two times a day (BID) | INTRAVENOUS | Status: DC
  Administered 2023-07-14 – 2023-07-28 (×26): 3 mL via INTRAVENOUS

## 2023-07-14 MED ORDER — CALCIUM GLUCONATE-NACL 1-0.675 GM/50ML-% IV SOLN: 1.0000 g | Freq: Once | INTRAVENOUS | Status: DC

## 2023-07-14 MED ORDER — LEVALBUTEROL HCL 0.63 MG/3ML IN NEBU: 0.6300 mg | INHALATION_SOLUTION | Freq: Four times a day (QID) | RESPIRATORY_TRACT | Status: DC | PRN

## 2023-07-14 MED ORDER — MOMETASONE FURO-FORMOTEROL FUM 100-5 MCG/ACT IN AERO
2.0000 | INHALATION_SPRAY | Freq: Two times a day (BID) | RESPIRATORY_TRACT | Status: DC
  Administered 2023-07-15 – 2023-08-07 (×41): 2 via RESPIRATORY_TRACT
  Filled 2023-07-14 (×2): qty 8.8

## 2023-07-14 MED ORDER — FUROSEMIDE 10 MG/ML IJ SOLN: 20.0000 mg | Freq: Two times a day (BID) | INTRAMUSCULAR | Status: DC

## 2023-07-14 MED ORDER — BUPRENORPHINE HCL-NALOXONE HCL 8-2 MG SL SUBL
1.0000 | SUBLINGUAL_TABLET | Freq: Every day | SUBLINGUAL | Status: DC
Start: 2023-07-14 — End: 2023-07-14

## 2023-07-14 MED ORDER — CHLORHEXIDINE GLUCONATE CLOTH 2 % EX PADS
6.0000 | MEDICATED_PAD | Freq: Every day | CUTANEOUS | Status: DC
  Administered 2023-07-14 – 2023-08-08 (×24): 6 via TOPICAL

## 2023-07-14 MED ORDER — MORPHINE SULFATE (PF) 2 MG/ML IV SOLN: 1.0000 mg | INTRAVENOUS | Status: DC | PRN

## 2023-07-14 MED ORDER — ENOXAPARIN SODIUM 40 MG/0.4ML IJ SOSY
40.0000 mg | PREFILLED_SYRINGE | INTRAMUSCULAR | Status: DC
  Administered 2023-07-14 – 2023-07-16 (×3): 40 mg via SUBCUTANEOUS
  Filled 2023-07-14 (×3): qty 0.4

## 2023-07-14 MED ORDER — FUROSEMIDE 10 MG/ML IJ SOLN
40.0000 mg | Freq: Two times a day (BID) | INTRAMUSCULAR | Status: DC
  Administered 2023-07-14: 40 mg via INTRAVENOUS
  Filled 2023-07-14: qty 4

## 2023-07-14 MED ORDER — FUROSEMIDE 10 MG/ML IJ SOLN: 20.0000 mg | Freq: Four times a day (QID) | INTRAMUSCULAR | Status: DC

## 2023-07-14 MED ORDER — DIGOXIN 125 MCG PO TABS
0.1250 mg | ORAL_TABLET | Freq: Every day | ORAL | Status: DC
  Administered 2023-07-14 – 2023-07-29 (×16): 0.125 mg via ORAL
  Filled 2023-07-14 (×16): qty 1

## 2023-07-14 MED ORDER — SODIUM CHLORIDE 0.9 % IV SOLN: 250.0000 mL | INTRAVENOUS | Status: DC | PRN

## 2023-07-14 MED ORDER — NITROGLYCERIN 0.4 MG SL SUBL: 0.4000 mg | SUBLINGUAL_TABLET | SUBLINGUAL | Status: DC | PRN

## 2023-07-14 MED ORDER — LORATADINE 10 MG PO TABS
10.0000 mg | ORAL_TABLET | Freq: Every evening | ORAL | Status: DC
  Administered 2023-07-15 – 2023-08-07 (×24): 10 mg via ORAL
  Filled 2023-07-14 (×24): qty 1

## 2023-07-14 MED ORDER — FLUTICASONE PROPIONATE 50 MCG/ACT NA SUSP
1.0000 | Freq: Every day | NASAL | Status: DC
  Filled 2023-07-14: qty 16

## 2023-07-14 MED ORDER — SPIRONOLACTONE 12.5 MG HALF TABLET
12.5000 mg | ORAL_TABLET | Freq: Every day | ORAL | Status: DC
  Administered 2023-07-14: 12.5 mg via ORAL
  Filled 2023-07-14: qty 1

## 2023-07-14 MED ORDER — PERFLUTREN LIPID MICROSPHERE
1.0000 mL | INTRAVENOUS | Status: AC | PRN
  Administered 2023-07-14: 4 mL via INTRAVENOUS

## 2023-07-14 MED ORDER — SODIUM CHLORIDE 0.9% FLUSH: 10.0000 mL | INTRAVENOUS | Status: DC | PRN

## 2023-07-14 MED ORDER — MONTELUKAST SODIUM 10 MG PO TABS
10.0000 mg | ORAL_TABLET | Freq: Every morning | ORAL | Status: DC
  Administered 2023-07-14 – 2023-07-29 (×16): 10 mg via ORAL
  Filled 2023-07-14 (×15): qty 1

## 2023-07-14 NOTE — Progress Notes (Addendum)
    Patient presented to the ED around midnight this AM complaining of orthopnea, PND, exertional dyspnea, dyspnea at rest, lower extremity edema.  BNP elevated to 2,494. hsTn 37>40. CXR showed vascular congestion without significant edema.At the time of initial cardiology evaluation, there was concern that patient was cool and wet on exam. Lactic acid 1.9. Echocardiogram pending. Etiology of heart failure currently unclear, may need ischemic evaluation later this admission if EF is reduced. He denies recent episodes of chest pain. Also does have significant PVCs on telemetry which could be contributing. Denies family history of premature CAD or CHF. Denies recent drug use or significant alcohol abuse. BP well controlled, not hypertensive.   On my exam this AM, patient's extremities are warm and well-perfused. He is breathing easily on room air. Continues to have 2+ edema in bilateral lower extremities, most notable in feet.   Note, as patient is only 37 years old, would likely benefit from advanced heart failure involvement if EF reduced.   Jonita Albee, PA-C 07/14/2023 10:27 AM

## 2023-07-14 NOTE — Progress Notes (Signed)
Peripherally Inserted Central Catheter Placement  The IV Nurse has discussed with the patient and/or persons authorized to consent for the patient, the purpose of this procedure and the potential benefits and risks involved with this procedure.  The benefits include less needle sticks, lab draws from the catheter, and the patient may be discharged home with the catheter. Risks include, but not limited to, infection, bleeding, blood clot (thrombus formation), and puncture of an artery; nerve damage and irregular heartbeat and possibility to perform a PICC exchange if needed/ordered by physician.  Alternatives to this procedure were also discussed.  Bard Power PICC patient education guide, fact sheet on infection prevention and patient information card has been provided to patient /or left at bedside.    PICC Placement Documentation  PICC Double Lumen 07/14/23 Right Basilic 39 cm 0 cm (Active)  Indication for Insertion or Continuance of Line Chronic illness with exacerbations (CF, Sickle Cell, etc.);Vasoactive infusions 07/14/23 1634  Exposed Catheter (cm) 0 cm 07/14/23 1634  Site Assessment Clean, Dry, Intact 07/14/23 1634  Lumen #1 Status Flushed;Saline locked;Blood return noted 07/14/23 1634  Lumen #2 Status Flushed;Saline locked;Blood return noted 07/14/23 1634  Dressing Type Transparent;Securing device 07/14/23 1634  Dressing Status Antimicrobial disc in place;Clean, Dry, Intact 07/14/23 1634  Safety Lock Not Applicable 07/14/23 1634  Line Care Connections checked and tightened 07/14/23 1634  Line Adjustment (NICU/IV Team Only) No 07/14/23 1634  Dressing Intervention New dressing;Other (Comment) 07/14/23 1634  Dressing Change Due 07/21/23 07/14/23 1634       Annett Fabian 07/14/2023, 4:35 PM

## 2023-07-14 NOTE — Progress Notes (Signed)
Heart Failure Navigator Progress Note  Assessed for Heart & Vascular TOC clinic readiness.  Patient does not meet criteria due to Advanced Heart Failure consult. .   Navigator will sign off at this time.   Earnestine Leys, BSN, Clinical cytogeneticist Only

## 2023-07-14 NOTE — ED Provider Notes (Signed)
[place] EMERGENCY DEPARTMENT AT [facility] Provider Note   CSN: [id number] Arrival date & time: 07/14/23  0007     History  Chief Complaint  Patient presents with   Leg Swelling   Shortness of Breath    Joel York is a 37 y.o. male.  Patient here with lower extremity swelling, dyspnea and fatigue.  Patient states that a couple weeks ago he jumped into a pool and his left lower leg hurt.  He states that slowly became swollen and started having orthopnea and dyspnea on exertion.  Over the last week his right leg is also been swollen and his fatigue has gotten worse his breathing got worse.  Little bit of chest pain but seems to be associated with the breathing.  No fevers.  No recent illnesses.  No history of the same.  No longstanding history of hypertension.  No history of coronary artery disease.  Does have a family history of heart failure.   Shortness of Breath      Home Medications Prior to Admission medications   Medication Sig Start Date End Date Taking? Authorizing Provider  albuterol (PROVENTIL HFA;VENTOLIN HFA) 108 (90 Base) MCG/ACT inhaler Inhale 2 puffs into the lungs every 6 (six) hours as needed for wheezing or shortness of breath. Shortness of breath 01/19/16  Yes Waldon Merl, PA-C  calcium carbonate (TUMS - DOSED IN MG ELEMENTAL CALCIUM) 500 MG chewable tablet Chew 1 tablet by mouth daily as needed for indigestion.   Yes [provider]  fluticasone (FLONASE) 50 MCG/ACT nasal spray Place 1 spray into both nostrils daily. Patient taking differently: Place 1 spray into both nostrils daily as needed for allergies. 04/30/23  Yes Radford Pax, NP  Fluticasone-Salmeterol (ADVAIR) 100-50 MCG/DOSE AEPB Inhale 1 puff into the lungs every 12 (twelve) hours. Patient taking differently: Inhale 1 puff into the lungs daily as needed (for shortness of breath). 02/14/15  Yes Marcelline Mates C, PA-C  ipratropium (ATROVENT) 0.03 % nasal spray Place 2 sprays  into both nostrils 2 (two) times daily. Patient taking differently: Place 2 sprays into both nostrils 2 (two) times daily as needed for rhinitis. 05/27/23  Yes Wallis Bamberg, PA-C  levocetirizine (XYZAL) 5 MG tablet Take 1 tablet (5 mg total) by mouth every evening. Patient taking differently: Take 5 mg by mouth daily as needed for allergies. 03/12/23  Yes Wallis Bamberg, PA-C  ondansetron (ZOFRAN-ODT) 4 MG disintegrating tablet Take 1 tablet (4 mg total) by mouth every 8 (eight) hours as needed for nausea or vomiting. 02/24/23  Yes Minna Antis, MD  SUBOXONE 8-2 MG FILM Place 1 Film under the tongue daily. 07/07/23  Yes [provider]  azelastine (ASTELIN) 0.1 % nasal spray Place 1 spray into both nostrils 2 (two) times daily. Use in each nostril as directed Patient not taking: Reported on 07/14/2023 05/27/23   Wallis Bamberg, PA-C      Allergies    Other and Peanuts [peanut oil]    Review of Systems   Review of Systems  Respiratory:  Positive for shortness of breath.     Physical Exam Updated Vital Signs BP (!) 109/95   Pulse (!) 110   Temp 98.9 F (37.2 C) (Oral)   Resp 16   Ht 5\' 7"  (1.702 m)   Wt 69.4 kg   SpO2 98%   BMI 23.96 kg/m  Physical Exam Vitals and nursing note reviewed.  Constitutional:      Appearance: He is well-developed.  HENT:  Head: Normocephalic and atraumatic.  Cardiovascular:     Rate and Rhythm: Normal rate.  Pulmonary:     Effort: Pulmonary effort is normal. No respiratory distress.     Breath sounds: Decreased breath sounds present. No rales.  Abdominal:     General: There is no distension.  Musculoskeletal:        General: Normal range of motion.     Cervical back: Normal range of motion.     Right lower leg: Edema present.     Left lower leg: Edema present.  Skin:    General: Skin is warm and dry.  Neurological:     General: No focal deficit present.     Mental Status: He is alert.     ED Results / Procedures / Treatments    Labs (all labs ordered are listed, but only abnormal results are displayed) Labs Reviewed  CBC - Abnormal; Notable for the following components:      Result Value   WBC 12.2 (*)    RBC 6.22 (*)    Hemoglobin 18.3 (*)    HCT 55.9 (*)    All other components within normal limits  COMPREHENSIVE METABOLIC PANEL - Abnormal; Notable for the following components:   CO2 19 (*)    Glucose, Bld 109 (*)    Creatinine, Ser 1.60 (*)    AST 73 (*)    ALT 74 (*)    Alkaline Phosphatase 141 (*)    Total Bilirubin 1.3 (*)    GFR, Estimated 57 (*)    All other components within normal limits  BRAIN NATRIURETIC PEPTIDE - Abnormal; Notable for the following components:   B Natriuretic Peptide 2,494.6 (*)    All other components within normal limits  CBC - Abnormal; Notable for the following components:   RBC 6.27 (*)    Hemoglobin 18.4 (*)    HCT 55.6 (*)    All other components within normal limits  I-STAT VENOUS BLOOD GAS, ED - Abnormal; Notable for the following components:   pH, Ven 7.545 (*)    pCO2, Ven 27.0 (*)    pO2, Ven 206 (*)    Acid-Base Excess 3.0 (*)    Calcium, Ion 0.85 (*)    HCT 55.0 (*)    Hemoglobin 18.7 (*)    All other components within normal limits  TROPONIN I (HIGH SENSITIVITY) - Abnormal; Notable for the following components:   Troponin I (High Sensitivity) 39 (*)    All other components within normal limits  TROPONIN I (HIGH SENSITIVITY) - Abnormal; Notable for the following components:   Troponin I (High Sensitivity) 43 (*)    All other components within normal limits  LACTIC ACID, PLASMA  BRAIN NATRIURETIC PEPTIDE  HIV ANTIBODY (ROUTINE TESTING W REFLEX)  BASIC METABOLIC PANEL  BASIC METABOLIC PANEL  BASIC METABOLIC PANEL  ANA W/REFLEX IF POSITIVE  VITAMIN B1  HIV-1/HIV-2 QUAL RNA  VITAMIN B12  FOLATE  IRON AND TIBC  FERRITIN  RETICULOCYTES  BLOOD GAS, VENOUS  COMPREHENSIVE METABOLIC PANEL  T4, FREE  TSH  SODIUM, URINE, RANDOM  CREATININE,  URINE, RANDOM  UREA NITROGEN, URINE  MICROALBUMIN / CREATININE URINE RATIO  MAGNESIUM  CALCIUM, IONIZED  D-DIMER, QUANTITATIVE  TROPONIN I (HIGH SENSITIVITY)  TROPONIN I (HIGH SENSITIVITY)    EKG EKG Interpretation Date/Time:  Monday July 14 2023 00:54:34 EDT Ventricular Rate:  128 PR Interval:  138 QRS Duration:  88 QT Interval:  290 QTC Calculation: 423 R Axis:   -76  Text Interpretation: Sinus tachycardia with frequent Premature ventricular complexes Biatrial enlargement Left axis deviation Inferior infarct , age undetermined Anterior infarct , age undetermined T wave abnormality, consider lateral ischemia Abnormal ECG When compared with ECG of 24-Feb-2023 22:01, PREVIOUS ECG IS PRESENT new TWI in lateral leads compared to march 2024 Confirmed by Marily Memos [phone]) on 07/14/2023 3:24:23 AM  Radiology DG Chest 2 View  Result Date: 07/14/2023 CLINICAL DATA:  SHORTNESS OF BREATH EXAM: CHEST - 2 VIEW COMPARISON:  02/24/2023 FINDINGS: Cardiac shadow is mildly enlarged. Lungs are well aerated bilaterally. Mild central vascular congestion is noted without interstitial edema. No focal infiltrate or effusion is seen. No bony abnormality is noted. IMPRESSION: Vascular congestion without significant edema. Electronically Signed   By: Alcide Clever M.D.   On: 07/14/2023 01:23    Procedures Procedures    Medications Ordered in ED Medications  azelastine (ASTELIN) 0.1 % nasal spray 1 spray (has no administration in time range)  fluticasone (FLONASE) 50 MCG/ACT nasal spray 1 spray (has no administration in time range)  mometasone-formoterol (DULERA) 100-5 MCG/ACT inhaler 2 puff (has no administration in time range)  ipratropium (ATROVENT) 0.03 % nasal spray 2 spray (has no administration in time range)  montelukast (SINGULAIR) tablet 10 mg (has no administration in time range)  loratadine (CLARITIN) tablet 10 mg (has no administration in time range)  sodium chloride flush (NS) 0.9 %  injection 3 mL (has no administration in time range)  sodium chloride flush (NS) 0.9 % injection 3 mL (has no administration in time range)  0.9 %  sodium chloride infusion (has no administration in time range)  acetaminophen (TYLENOL) tablet 650 mg (has no administration in time range)  ondansetron (ZOFRAN) injection 4 mg (4 mg Intravenous Given 07/14/23 0513)  enoxaparin (LOVENOX) injection 40 mg (has no administration in time range)  acetaminophen (TYLENOL) tablet 650 mg (has no administration in time range)  furosemide (LASIX) injection 20 mg (20 mg Intravenous Given 07/14/23 0608)  nitroGLYCERIN (NITROSTAT) SL tablet 0.4 mg (has no administration in time range)  morphine (PF) 2 MG/ML injection 1 mg (has no administration in time range)  levalbuterol (XOPENEX) nebulizer solution 0.63 mg (has no administration in time range)  furosemide (LASIX) injection 40 mg (40 mg Intravenous Given 07/14/23 0430)    ED Course/ Medical Decision Making/ A&P                             Medical Decision Making Amount and/or Complexity of Data Reviewed Labs: ordered. Radiology: ordered.  Risk Prescription drug management. Decision regarding hospitalization.   Patient has new onset heart failure of unclear etiology.  BNP is up his chest x-ray shows pulmonary edema and his physical exam is consistent.  He is mildly tachycardic concern for possible shock however blood pressure was fine.  EKG was some new T wave inversions and some ST depressions.  No evidence of STEMI.  Lasix given.  Also has a mild AKI and elevated liver enzymes probably related to the heart failure.  Will discuss with hospitalist for admission.  Final Clinical Impression(s) / ED Diagnoses Final diagnoses:  Peripheral edema  Acute pulmonary edema [facility])    Rx / DC Orders ED Discharge Orders     None         Jacie Tristan, Barbara Cower, MD 07/14/23 [phone]

## 2023-07-14 NOTE — Telephone Encounter (Signed)
Pharmacy Patient Advocate Encounter  Received notification from [facility] that Prior Authorization for Farxiga 10MG  tablets has been APPROVED from 07/14/2023 to 07/13/2024.Marland Kitchen  PA #/Case ID/Reference #: [id number]  Copay is $4.00

## 2023-07-14 NOTE — TOC Benefit Eligibility Note (Signed)
Pharmacy Patient Advocate Encounter  Insurance verification completed.    The patient is insured through [facility]   Ran test claim for Entresto 24-26 mg and the current 30 day co-pay is $4.00.  Ran test claim for Farxiga 10 mg and Requires Prior Authorization  Ran test claim for Jardiance 10 mg and Requires Prior Authorization   This test claim was processed through [employer]- copay amounts may vary at other pharmacies due to [employer], or as the patient moves through the different stages of their insurance plan.    Joel York, CPHT Pharmacy Patient Advocate Specialist [facility] Pharmacy Patient Advocate Team Direct Number: [phone]  Fax: [phone]

## 2023-07-14 NOTE — Progress Notes (Signed)
Patient admitted to 3E10 in NAD, VS stable. Patient free from pain, spouse at bedside. Patient oriented to room and call bell in reach.

## 2023-07-14 NOTE — TOC Progression Note (Signed)
Transition of Care [facility]) - Progression Note    Patient Details  Name: Joel York MRN: [id number] Date of Birth: February 27, 1986  Transition of Care [facility]) CM/SW Contact  Reva Bores, LCSWA Phone Number: 07/14/2023, 3:53 PM  Clinical Narrative: CSW spoke with Patsy Lager at [facility] at [facility] in reference to scheduling a PCP appointment. They are not scheduling appointments until after 9/13, however she is contacting the nurse to see if they can schedule an appointment for an earlier time due to recent hospitalization. CSW gave the pts number to be contacted for scheduling. TOC will continue to follow.       Expected Discharge Plan: Home/Self Care Barriers to Discharge: Continued Medical Work up  Expected Discharge Plan and Services       Living arrangements for the past 2 months: Single Family Home                                       Social Determinants of Health (SDOH) Interventions SDOH Screenings   Food Insecurity: No Food Insecurity (07/14/2023)  Housing: Low Risk  (07/14/2023)  Transportation Needs: No Transportation Needs (07/14/2023)  Utilities: Not At Risk (07/14/2023)  Alcohol Screen: Low Risk  (07/14/2023)  Financial Resource Strain: High Risk (07/14/2023)  Physical Activity: Sufficiently Active (07/14/2023)  Stress: Stress Concern Present (07/14/2023)  Tobacco Use: High Risk (07/14/2023)    Readmission Risk Interventions     No data to display

## 2023-07-14 NOTE — Progress Notes (Signed)
PROGRESS NOTE  Joel York NWG:[id number] DOB: February 08, 1986 DOA: 07/14/2023 PCP: Pcp, No  HPI/Recap of past 24 hours: Joel York is a 37 y.o. male with medical history significant of recurrent sinusitis, chronic smoker and asthma presented to the ED, c/o worsening BLE edema, progressive orthopnea, dyspnea and PND X 2 weeks. Patient also reported occasional chest pressure and associated nausea. In the ED, VSS, labs showed WBC 12.2, hemoglobin 18.3, creatinine 1.6, elevated AST/ALT 74/73, ALP 141, bilirubin 1.3, BNP 2494, troponin 39>43. EKG showed sinus tachycardia with frequent PVC. CXR showed vascular congestion without significant pulmonary edema.  Patient admitted for further management.   Today, pt denies any worsening SOB, chest pain, N/V, fever/chills. Wife and baby at bedside in the ED. Advanced HF team consulted due to EF of <20%    Assessment/Plan: Principal Problem:   Acute decompensated heart failure (HCC) Active Problems:   AKI (acute kidney injury) (HCC)   Acute on chronic systolic CHF (congestive heart failure) (HCC)   Elevated troponin   Sinus tachycardia   PVC (premature ventricular contraction)   History of asthma   Myocardial injury   Polycythemia   Chest pain   Transaminitis   Elevated bilirubin   CHF exacerbation (HCC)   Acute systolic HF Pulmonary HTN BNP 5784 Troponin 39>43, EKG showed sinus tachycardia with frequent PVC CXR showed vascular congestion without significant pulmonary edema ECHO with EF of <20%, left ventricle demonstrates global hypokinesis. Left ventricular  diastolic parameters are indeterminate. There is moderately elevated pulmonary artery systolic pressure. The estimated right ventricular systolic pressure is 53.9 mmHg Advanced heart failure team consulted, appreciate recs Continue lasix, start digoxin, mexiletine, aldactone Continue monitoring strict I's/O, daily weights   Elevated troponin Currently chest pain free Troponin  trended up to 39-43 Likely demand ischemia in the setting of acute CHF exacerbation Cardiology on board   Sinus tachycardia with frequent PVC EKG showed sinus tachycardia with PVC Cardiology started Mexiletine   HypomagnesemiaHypomagnesemia Replace prn   AKI Creatinine 1.6.  Baseline normal renal function Likely cardiorenal Monitor closely while on diuresis  HTN Stable  Transaminitis Elevated bilirubin Likely hepatic congestion in the setting of HF Elevated AST 70, ALT 74 and ALP 141, bilirubin 1.3    Polycythemia Ongoing Daily CBC and monitor   History of asthma Stable Continue home azelastine nasal spray, Flonase nasal spray, Atrovent 2 spray twice daily, Dulera 2 puff twice daily and singular 10 mg daily Continue on Xopenex as needed   Tobacco abuse Patient reported to smoking around 6 cigarettes in a day Counseled patient at bedside for smoking cessation  Hx of opoid dependence  Continue suboxone    Estimated body mass index is 23.96 kg/m as calculated from the following:   Height as of this encounter: 5\' 7"  (1.702 m).   Weight as of this encounter: 69.4 kg.     Code Status: Full  Family Communication: Wife at bedside  Disposition Plan: Status is: Inpatient Remains inpatient appropriate because: Level of care      Consultants: HF/Cardiology  Procedures: None  Antimicrobials: None  DVT prophylaxis:  Lovenox   Objective: Vitals:   07/14/23 0641 07/14/23 0730 07/14/23 0742 07/14/23 1052  BP:  (!) 116/99  101/81  Pulse:  (!) 56  93  Resp:  15  15  Temp: 98.9 F (37.2 C)  (!) 97.4 F (36.3 C) 97.9 F (36.6 C)  TempSrc: Oral  Oral Oral  SpO2:  96%  97%  Weight:  Height:        Intake/Output Summary (Last 24 hours) at 07/14/2023 1329 Last data filed at 07/14/2023 1057 Gross per 24 hour  Intake --  Output 1845 ml  Net -1845 ml   Filed Weights   07/14/23 0055  Weight: 69.4 kg    Exam: General: NAD  Cardiovascular: S1,  S2 present Respiratory: CTAB Abdomen: Soft, nontender, nondistended, bowel sounds present Musculoskeletal: No bilateral pedal edema noted Skin: Normal Psychiatry: Normal mood     Data Reviewed: CBC: Recent Labs  Lab 07/14/23 0101 07/14/23 0511 07/14/23 0620  WBC 12.2* 10.1  --   HGB 18.3* 18.4* 18.7*  HCT 55.9* 55.6* 55.0*  MCV 89.9 88.7  --   PLT 271 261  --    Basic Metabolic Panel: Recent Labs  Lab 07/14/23 0101 07/14/23 0611 07/14/23 0620 07/14/23 0730  NA 137 139 139  --   K 3.7 3.9 4.4  --   CL 105 104  --   --   CO2 19* 23  --   --   GLUCOSE 109* 118*  --   --   BUN 12 12  --   --   CREATININE 1.60* 1.62*  --   --   CALCIUM 8.9 8.5*  --   --   MG  --   --   --  1.6*   GFR: Estimated Creatinine Clearance: 58.9 mL/min (A) (by C-G formula based on SCr of 1.62 mg/dL (H)). Liver Function Tests: Recent Labs  Lab 07/14/23 0101 07/14/23 0611  AST 73* 80*  ALT 74* 74*  ALKPHOS 141* 134*  BILITOT 1.3* 1.6*  PROT 6.5 6.2*  ALBUMIN 3.5 3.5   No results for input(s): "LIPASE", "AMYLASE" in the last 168 hours. No results for input(s): "AMMONIA" in the last 168 hours. Coagulation Profile: No results for input(s): "INR", "PROTIME" in the last 168 hours. Cardiac Enzymes: No results for input(s): "CKTOTAL", "CKMB", "CKMBINDEX", "TROPONINI" in the last 168 hours. BNP (last 3 results) No results for input(s): "PROBNP" in the last 8760 hours. HbA1C: No results for input(s): "HGBA1C" in the last 72 hours. CBG: No results for input(s): "GLUCAP" in the last 168 hours. Lipid Profile: No results for input(s): "CHOL", "HDL", "LDLCALC", "TRIG", "CHOLHDL", "LDLDIRECT" in the last 72 hours. Thyroid Function Tests: Recent Labs    07/14/23 0611  TSH 4.982*  FREET4 0.79   Anemia Panel: Recent Labs    07/14/23 0502 07/14/23 0611 07/14/23 0739  VITAMINB12  --   --  597  FOLATE  --  18.5  --   RETICCTPCT 2.0  --   --    Urine analysis:    Component Value  Date/Time   COLORURINE YELLOW 02/21/2015 0932   APPEARANCEUR CLEAR 02/21/2015 0932   LABSPEC 1.020 02/21/2015 0932   PHURINE 6.0 02/21/2015 0932   GLUCOSEU NEGATIVE 02/21/2015 0932   HGBUR NEGATIVE 02/21/2015 0932   BILIRUBINUR NEGATIVE 02/21/2015 0932   KETONESUR NEGATIVE 02/21/2015 0932   PROTEINUR NEGATIVE 04/29/2012 1035   UROBILINOGEN 0.2 02/21/2015 0932   NITRITE NEGATIVE 02/21/2015 0932   LEUKOCYTESUR NEGATIVE 02/21/2015 0932   Sepsis Labs: @LABRCNTIP (procalcitonin:4,lacticidven:4)  )No results found for this or any previous visit (from the past 240 hour(s)).    Studies: [place] EKG SITE RITE  Result Date: 07/14/2023 If [facility] image not attached, placement could not be confirmed due to current cardiac rhythm.  ECHOCARDIOGRAM COMPLETE  Result Date: 07/14/2023    ECHOCARDIOGRAM REPORT   Patient Name:   ASHAZ  Montemurro Date of Exam: 07/14/2023 Medical Rec #:  [id number]    Height:       67.0 in Accession #:    [id number]   Weight:       153.0 lb Date of Birth:  11/16/1986    BSA:          1.805 m Patient Age:    37 years     BP:           95/57 mmHg Patient Gender: M            HR:           112 bpm. Exam Location:  Inpatient Procedure: 2D Echo, Color Doppler, Cardiac Doppler and Intracardiac            Opacification Agent             REPORT CONTAINS CRITICAL RESULT  Results communicated to Dr Sharolyn Douglas at 1243 on 07/14/23. Indications:    Congestive Heart Failure  History:        Patient has no prior history of Echocardiogram examinations.                 CHF, Polycythemia; Arrythmias:PVC.  Sonographer:    Milbert Coulter Referring Phys: [id number] SUBRINA SUNDIL IMPRESSIONS  1. Left ventricular ejection fraction, by estimation, is <20%. The left ventricle has severely decreased function. The left ventricle demonstrates global hypokinesis. The left ventricular internal cavity size was severely dilated. Left ventricular diastolic parameters are indeterminate.  2. Right ventricular systolic function  is mildly reduced. The right ventricular size is moderately enlarged. There is moderately elevated pulmonary artery systolic pressure. The estimated right ventricular systolic pressure is 53.9 mmHg.  3. Left atrial size was mildly dilated.  4. Right atrial size was moderately dilated.  5. The mitral valve is abnormal. Mild to moderate mitral valve regurgitation.  6. Tricuspid valve regurgitation is mild to moderate.  7. The aortic valve is tricuspid. Aortic valve regurgitation is not visualized. No aortic stenosis is present.  8. The inferior vena cava is dilated in size with <50% respiratory variability, suggesting right atrial pressure of 15 mmHg. FINDINGS  Left Ventricle: Left ventricular ejection fraction, by estimation, is <20%. The left ventricle has severely decreased function. The left ventricle demonstrates global hypokinesis. Definity contrast agent was given IV to delineate the left ventricular endocardial borders. The left ventricular internal cavity size was severely dilated. There is no left ventricular hypertrophy. Left ventricular diastolic parameters are indeterminate. Right Ventricle: The right ventricular size is moderately enlarged. No increase in right ventricular wall thickness. Right ventricular systolic function is mildly reduced. There is moderately elevated pulmonary artery systolic pressure. The tricuspid regurgitant velocity is 3.12 m/s, and with an assumed right atrial pressure of 15 mmHg, the estimated right ventricular systolic pressure is 53.9 mmHg. Left Atrium: Left atrial size was mildly dilated. Right Atrium: Right atrial size was moderately dilated. Pericardium: There is no evidence of pericardial effusion. Mitral Valve: The mitral valve is abnormal. Mild to moderate mitral valve regurgitation. Tricuspid Valve: The tricuspid valve is normal in structure. Tricuspid valve regurgitation is mild to moderate. Aortic Valve: The aortic valve is tricuspid. Aortic valve regurgitation is  not visualized. No aortic stenosis is present. Aortic valve mean gradient measures 1.0 mmHg. Aortic valve peak gradient measures 1.4 mmHg. Aortic valve area, by VTI measures 3.56 cm. Pulmonic Valve: The pulmonic valve was grossly normal. Pulmonic valve regurgitation is trivial. Aorta: The aortic root and ascending aorta are structurally normal,  with no evidence of dilitation. Venous: The inferior vena cava is dilated in size with less than 50% respiratory variability, suggesting right atrial pressure of 15 mmHg. IAS/Shunts: No atrial level shunt detected by color flow Doppler.  LEFT VENTRICLE PLAX 2D LVIDd:         7.40 cm   Diastology LVIDs:         7.10 cm   LV e' medial: 5.00 cm/s LV PW:         0.90 cm LV IVS:        0.90 cm LVOT diam:     2.20 cm LV SV:         25 LV SV Index:   14 LVOT Area:     3.80 cm  RIGHT VENTRICLE            IVC RV S prime:     9.03 cm/s  IVC diam: 2.30 cm TAPSE (M-mode): 1.3 cm LEFT ATRIUM             Index        RIGHT ATRIUM           Index LA diam:        4.10 cm 2.27 cm/m   RA Area:     22.70 cm LA Vol (A2C):   88.7 ml 49.15 ml/m  RA Volume:   82.40 ml  45.66 ml/m LA Vol (A4C):   56.1 ml 31.09 ml/m LA Biplane Vol: 70.2 ml 38.90 ml/m  AORTIC VALVE AV Area (Vmax):    3.41 cm AV Area (Vmean):   3.09 cm AV Area (VTI):     3.56 cm AV Vmax:           58.20 cm/s AV Vmean:          43.700 cm/s AV VTI:            0.070 m AV Peak Grad:      1.4 mmHg AV Mean Grad:      1.0 mmHg LVOT Vmax:         52.20 cm/s LVOT Vmean:        35.500 cm/s LVOT VTI:          0.066 m LVOT/AV VTI ratio: 0.94  AORTA Ao Root diam: 3.50 cm Ao Asc diam:  3.20 cm MR Peak grad: 67.9 mmHg   TRICUSPID VALVE MR Mean grad: 48.0 mmHg   TR Peak grad:   38.9 mmHg MR Vmax:      412.00 cm/s TR Vmax:        312.00 cm/s MR Vmean:     333.0 cm/s                           SHUNTS                           Systemic VTI:  0.07 m                           Systemic Diam: 2.20 cm Epifanio Lesches MD Electronically signed by  Epifanio Lesches MD Signature Date/Time: 07/14/2023/12:48:51 PM    Final    DG Chest 2 View  Result Date: 07/14/2023 CLINICAL DATA:  SHORTNESS OF BREATH EXAM: CHEST - 2 VIEW COMPARISON:  02/24/2023 FINDINGS: Cardiac shadow is mildly enlarged. Lungs are well aerated bilaterally. Mild central vascular congestion is noted without interstitial edema.  No focal infiltrate or effusion is seen. No bony abnormality is noted. IMPRESSION: Vascular congestion without significant edema. Electronically Signed   By: Alcide Clever M.D.   On: 07/14/2023 01:23    Scheduled Meds:  azelastine  1 spray Each Nare BID   buprenorphine-naloxone  1 tablet Sublingual Daily   digoxin  0.125 mg Oral Daily   enoxaparin (LOVENOX) injection  40 mg Subcutaneous Q24H   fluticasone  1 spray Each Nare Daily   furosemide  40 mg Intravenous BID   ipratropium  2 spray Each Nare BID   loratadine  10 mg Oral QPM   mexiletine  200 mg Oral BID   mometasone-formoterol  2 puff Inhalation BID   montelukast  10 mg Oral q morning   sodium chloride flush  3 mL Intravenous Q12H   spironolactone  12.5 mg Oral Daily    Continuous Infusions:  sodium chloride       LOS: 0 days     Briant Cedar, MD [facility]  If 7PM-7AM, please contact night-coverage [email or web address] 07/14/2023, 1:29 PM

## 2023-07-14 NOTE — Telephone Encounter (Signed)
Deandra called from [facility] to schedule hospital f/u however the first available appt was in Sept. Please contact pt to schedule hospital f/u as he will be discharged on 7/26.

## 2023-07-14 NOTE — ED Notes (Signed)
ED TO INPATIENT HANDOFF REPORT  ED Nurse Name and Phone #: [phone]  S Name/Age/Gender Joel York 37 y.o. male Room/Bed: 041C/041C  Code Status   Code Status: Full Code  Home/SNF/Other Home Patient oriented to: self, place, time, and situation Is this baseline? Yes   Triage Complete: Triage complete  Chief Complaint CHF exacerbation (HCC) [I50.9]  Triage Note Pt arrived from home via POV c/o SOB x 2 weeks that first started upon exertion has now progressed to sob at rest intermittently. Pt is also concerned that bilateral feet and ankles are notable swollen and red. Zero noted pitting in triage.    Allergies Allergies  Allergen Reactions   Other Anaphylaxis    Tree Nuts   Peanuts [Peanut Oil] Anaphylaxis    Level of Care/Admitting Diagnosis ED Disposition     ED Disposition  Admit   Condition  --   Comment  Hospital Area: [facility] [[id number]]  Level of Care: Telemetry Cardiac [[id number]]  May admit patient to [facility] or [facility] if equivalent level of care is available:: No  Covid Evaluation: Asymptomatic - no recent exposure (last 10 days) testing not required  Diagnosis: CHF exacerbation [facility]) [[id number]]  Admitting Physician: Tereasa Coop [[id number]]  Attending Physician: Tereasa Coop [[id number]]  Certification:: I certify this patient will need inpatient services for at least 2 midnights  Estimated Length of Stay: 5          B Medical/Surgery History Past Medical History:  Diagnosis Date   Acid reflux    ADHD (attention deficit hyperactivity disorder)    Asthma    Back pain    Seizures (HCC)    Resolved   Past Surgical History:  Procedure Laterality Date   CHOLECYSTECTOMY     WISDOM TOOTH EXTRACTION       A IV Location/Drains/Wounds Patient Lines/Drains/Airways Status     Active Line/Drains/Airways     Name Placement date Placement time Site Days   Peripheral IV 07/14/23 18 G 1.16" Anterior;Right Forearm  07/14/23  0258  Forearm  less than 1            Intake/Output Last 24 hours  Intake/Output Summary (Last 24 hours) at 07/14/2023 1251 Last data filed at 07/14/2023 1057 Gross per 24 hour  Intake --  Output 1845 ml  Net -1845 ml    Labs/Imaging Results for orders placed or performed during the hospital encounter of 07/14/23 (from the past 48 hour(s))  CBC     Status: Abnormal   Collection Time: 07/14/23  1:01 AM  Result Value Ref Range   WBC 12.2 (H) 4.0 - 10.5 K/uL   RBC 6.22 (H) 4.22 - 5.81 MIL/uL   Hemoglobin 18.3 (H) 13.0 - 17.0 g/dL   HCT 46.9 (H) 62.9 - 52.8 %    Comment: REPEATED TO VERIFY   MCV 89.9 80.0 - 100.0 fL   MCH 29.4 26.0 - 34.0 pg   MCHC 32.7 30.0 - 36.0 g/dL   RDW 41.3 24.4 - 01.0 %   Platelets 271 150 - 400 K/uL   nRBC 0.0 0.0 - 0.2 %    Comment: Performed at [facility], [address], [place], [place] [address]  Comprehensive metabolic panel     Status: Abnormal   Collection Time: 07/14/23  1:01 AM  Result Value Ref Range   Sodium 137 135 - 145 mmol/L   Potassium 3.7 3.5 - 5.1 mmol/L   Chloride 105 98 - 111 mmol/L  CO2 19 (L) 22 - 32 mmol/L   Glucose, Bld 109 (H) 70 - 99 mg/dL    Comment: Glucose reference range applies only to samples taken after fasting for at least 8 hours.   BUN 12 6 - 20 mg/dL   Creatinine, Ser 1.30 (H) 0.61 - 1.24 mg/dL   Calcium 8.9 8.9 - 86.5 mg/dL   Total Protein 6.5 6.5 - 8.1 g/dL   Albumin 3.5 3.5 - 5.0 g/dL   AST 73 (H) 15 - 41 U/L   ALT 74 (H) 0 - 44 U/L   Alkaline Phosphatase 141 (H) 38 - 126 U/L   Total Bilirubin 1.3 (H) 0.3 - 1.2 mg/dL   GFR, Estimated 57 (L) >60 mL/min    Comment: (NOTE) Calculated using the CKD-EPI Creatinine Equation (2021)    Anion gap 13 5 - 15    Comment: Performed at [facility], [address], [place], [place] [address]  Brain natriuretic peptide     Status: Abnormal   Collection Time: 07/14/23  1:01 AM  Result Value Ref Range   B Natriuretic Peptide 2,494.6 (H)  0.0 - 100.0 pg/mL    Comment: Performed at [facility], [address], [place], [place] [address]  Troponin I (High Sensitivity)     Status: Abnormal   Collection Time: 07/14/23  1:01 AM  Result Value Ref Range   Troponin I (High Sensitivity) 39 (H) <18 ng/L    Comment: (NOTE) Elevated high sensitivity troponin I (hsTnI) values and significant  changes across serial measurements may suggest ACS but many other  chronic and acute conditions are known to elevate hsTnI results.  Refer to the "Links" section for chest pain algorithms and additional  guidance. Performed at [facility], [address], [place], [place] [address]   Troponin I (High Sensitivity)     Status: Abnormal   Collection Time: 07/14/23  3:03 AM  Result Value Ref Range   Troponin I (High Sensitivity) 43 (H) <18 ng/L    Comment: (NOTE) Elevated high sensitivity troponin I (hsTnI) values and significant  changes across serial measurements may suggest ACS but many other  chronic and acute conditions are known to elevate hsTnI results.  Refer to the "Links" section for chest pain algorithms and additional  guidance. Performed at [facility], [address] [place], [place] [address]   Reticulocytes     Status: Abnormal   Collection Time: 07/14/23  5:02 AM  Result Value Ref Range   Retic Ct Pct 2.0 0.4 - 3.1 %   RBC. 6.24 (H) 4.22 - 5.81 MIL/uL   Retic Count, Absolute 126.0 19.0 - 186.0 K/uL   Immature Retic Fract 16.9 (H) 2.3 - 15.9 %    Comment: Performed at [facility], [address], [place], [place] [address]  HIV Antibody (routine testing w rflx)     Status: None   Collection Time: 07/14/23  5:11 AM  Result Value Ref Range   HIV Screen 4th Generation wRfx Non Reactive Non Reactive    Comment: Performed at [facility], [address], [place], [place] [address]  CBC     Status: Abnormal   Collection Time: 07/14/23  5:11 AM  Result Value Ref Range   WBC 10.1 4.0 - 10.5  K/uL   RBC 6.27 (H) 4.22 - 5.81 MIL/uL   Hemoglobin 18.4 (H) 13.0 - 17.0 g/dL   HCT 34.7 (H) 42.5 - 95.6 %  Comment: REPEATED TO VERIFY   MCV 88.7 80.0 - 100.0 fL   MCH 29.3 26.0 - 34.0 pg   MCHC 33.1 30.0 - 36.0 g/dL   RDW 40.9 81.1 - 91.4 %   Platelets 261 150 - 400 K/uL   nRBC 0.0 0.0 - 0.2 %    Comment: Performed at [facility], [address], [place], [place] [address]  Lactic acid, plasma     Status: None   Collection Time: 07/14/23  5:56 AM  Result Value Ref Range   Lactic Acid, Venous 1.9 0.5 - 1.9 mmol/L    Comment: Performed at [facility], [address] [place], [place] [address]  Folate     Status: None   Collection Time: 07/14/23  6:11 AM  Result Value Ref Range   Folate 18.5 >5.9 ng/mL    Comment: Performed at [facility], [address], [place], [place] [address]  Comprehensive metabolic panel     Status: Abnormal   Collection Time: 07/14/23  6:11 AM  Result Value Ref Range   Sodium 139 135 - 145 mmol/L   Potassium 3.9 3.5 - 5.1 mmol/L    Comment: HEMOLYSIS AT THIS LEVEL MAY AFFECT RESULT   Chloride 104 98 - 111 mmol/L   CO2 23 22 - 32 mmol/L   Glucose, Bld 118 (H) 70 - 99 mg/dL    Comment: Glucose reference range applies only to samples taken after fasting for at least 8 hours.   BUN 12 6 - 20 mg/dL   Creatinine, Ser 4.69 (H) 0.61 - 1.24 mg/dL   Calcium 8.5 (L) 8.9 - 10.3 mg/dL   Total Protein 6.2 (L) 6.5 - 8.1 g/dL   Albumin 3.5 3.5 - 5.0 g/dL   AST 80 (H) 15 - 41 U/L    Comment: HEMOLYSIS AT THIS LEVEL MAY AFFECT RESULT   ALT 74 (H) 0 - 44 U/L    Comment: HEMOLYSIS AT THIS LEVEL MAY AFFECT RESULT   Alkaline Phosphatase 134 (H) 38 - 126 U/L   Total Bilirubin 1.6 (H) 0.3 - 1.2 mg/dL    Comment: HEMOLYSIS AT THIS LEVEL MAY AFFECT RESULT   GFR, Estimated 56 (L) >60 mL/min    Comment: (NOTE) Calculated using the CKD-EPI Creatinine Equation (2021)    Anion gap 12 5 - 15    Comment: Performed at [facility], [address], [place], [place] [address]  T4, free     Status: None   Collection Time: 07/14/23  6:11 AM  Result Value Ref Range   Free T4 0.79 0.61 - 1.12 ng/dL    Comment: HEMOLYSIS AT THIS LEVEL MAY AFFECT RESULT (NOTE) Biotin ingestion may interfere with free T4 tests. If the results are inconsistent with the TSH level, previous test results, or the clinical presentation, then consider biotin interference. If needed, order repeat testing after stopping biotin. Performed at [facility], [address], [place], [place] [address]   Troponin I (High Sensitivity)     Status: Abnormal   Collection Time: 07/14/23  6:11 AM  Result Value Ref Range   Troponin I (High Sensitivity) 37 (H) <18 ng/L    Comment: (NOTE) Elevated high sensitivity troponin I (hsTnI) values and significant  changes across serial measurements may suggest ACS but many other  chronic and acute conditions are known to elevate hsTnI results.  Refer to the "Links" section for chest pain algorithms and additional  guidance. Performed at  [facility], [address] [place], [place] [address]   TSH     Status: Abnormal   Collection Time: 07/14/23  6:11 AM  Result Value Ref Range   TSH 4.982 (H) 0.350 - 4.500 uIU/mL    Comment: Performed by a 3rd Generation assay with a functional sensitivity of <=0.01 uIU/mL. Performed at [facility], [address] [place], [place] [address]   I-Stat venous blood gas, ED     Status: Abnormal   Collection Time: 07/14/23  6:20 AM  Result Value Ref Range   pH, Ven 7.545 (H) 7.25 - 7.43   pCO2, Ven 27.0 (L) 44 - 60 mmHg   pO2, Ven 206 (H) 32 - 45 mmHg   Bicarbonate 23.3 20.0 - 28.0 mmol/L   TCO2 24 22 - 32 mmol/L   O2 Saturation 100 %   Acid-Base Excess 3.0 (H) 0.0 - 2.0 mmol/L   Sodium 139 135 - 145 mmol/L   Potassium 4.4 3.5 - 5.1 mmol/L   Calcium, Ion 0.85 (LL) 1.15 - 1.40 mmol/L   HCT 55.0 (H) 39.0 - 52.0 %   Hemoglobin 18.7 (H) 13.0 - 17.0 g/dL   Sample type  VENOUS    Comment NOTIFIED PHYSICIAN   Sodium, urine, random     Status: None   Collection Time: 07/14/23  6:51 AM  Result Value Ref Range   Sodium, Ur 103 mmol/L    Comment: Performed at [facility], [address], [place], [place] [address]  Creatinine, urine, random     Status: None   Collection Time: 07/14/23  6:51 AM  Result Value Ref Range   Creatinine, Urine <10 mg/dL    Comment: REPEATED TO VERIFY Performed at [facility], [address], [place], [place] [address]   Troponin I (High Sensitivity)     Status: Abnormal   Collection Time: 07/14/23  7:30 AM  Result Value Ref Range   Troponin I (High Sensitivity) 40 (H) <18 ng/L    Comment: (NOTE) Elevated high sensitivity troponin I (hsTnI) values and significant  changes across serial measurements may suggest ACS but many other  chronic and acute conditions are known to elevate hsTnI results.  Refer to the "Links" section for chest pain algorithms and additional  guidance. Performed at [facility], [address] [place], [place] [address]   Magnesium     Status: Abnormal   Collection Time: 07/14/23  7:30 AM  Result Value Ref Range   Magnesium 1.6 (L) 1.7 - 2.4 mg/dL    Comment: Performed at [facility], [address], [place], [place] [address]  Vitamin B12     Status: None   Collection Time: 07/14/23  7:39 AM  Result Value Ref Range   Vitamin B-12 597 180 - 914 pg/mL    Comment: (NOTE) This assay is not validated for testing neonatal or myeloproliferative syndrome specimens for Vitamin B12 levels. Performed at [facility], [address], [place], [place] [address]    ECHOCARDIOGRAM COMPLETE  Result Date: 07/14/2023    ECHOCARDIOGRAM REPORT   Patient Name:   LAMOYNE HESSEL Date of Exam: 07/14/2023 Medical Rec #:  [id number]    Height:       67.0 in Accession #:    [id number]   Weight:       153.0 lb Date of Birth:  09-23-1986    BSA:  1.805 m Patient Age:    37 years      BP:           95/57 mmHg Patient Gender: M            HR:           112 bpm. Exam Location:  Inpatient Procedure: 2D Echo, Color Doppler, Cardiac Doppler and Intracardiac            Opacification Agent             REPORT CONTAINS CRITICAL RESULT  Results communicated to Dr Sharolyn Douglas at 1243 on 07/14/23. Indications:    Congestive Heart Failure  History:        Patient has no prior history of Echocardiogram examinations.                 CHF, Polycythemia; Arrythmias:PVC.  Sonographer:    Milbert Coulter Referring Phys: [id number] SUBRINA SUNDIL IMPRESSIONS  1. Left ventricular ejection fraction, by estimation, is <20%. The left ventricle has severely decreased function. The left ventricle demonstrates global hypokinesis. The left ventricular internal cavity size was severely dilated. Left ventricular diastolic parameters are indeterminate.  2. Right ventricular systolic function is mildly reduced. The right ventricular size is moderately enlarged. There is moderately elevated pulmonary artery systolic pressure. The estimated right ventricular systolic pressure is 53.9 mmHg.  3. Left atrial size was mildly dilated.  4. Right atrial size was moderately dilated.  5. The mitral valve is abnormal. Mild to moderate mitral valve regurgitation.  6. Tricuspid valve regurgitation is mild to moderate.  7. The aortic valve is tricuspid. Aortic valve regurgitation is not visualized. No aortic stenosis is present.  8. The inferior vena cava is dilated in size with <50% respiratory variability, suggesting right atrial pressure of 15 mmHg. FINDINGS  Left Ventricle: Left ventricular ejection fraction, by estimation, is <20%. The left ventricle has severely decreased function. The left ventricle demonstrates global hypokinesis. Definity contrast agent was given IV to delineate the left ventricular endocardial borders. The left ventricular internal cavity size was severely dilated. There is no left ventricular hypertrophy. Left ventricular  diastolic parameters are indeterminate. Right Ventricle: The right ventricular size is moderately enlarged. No increase in right ventricular wall thickness. Right ventricular systolic function is mildly reduced. There is moderately elevated pulmonary artery systolic pressure. The tricuspid regurgitant velocity is 3.12 m/s, and with an assumed right atrial pressure of 15 mmHg, the estimated right ventricular systolic pressure is 53.9 mmHg. Left Atrium: Left atrial size was mildly dilated. Right Atrium: Right atrial size was moderately dilated. Pericardium: There is no evidence of pericardial effusion. Mitral Valve: The mitral valve is abnormal. Mild to moderate mitral valve regurgitation. Tricuspid Valve: The tricuspid valve is normal in structure. Tricuspid valve regurgitation is mild to moderate. Aortic Valve: The aortic valve is tricuspid. Aortic valve regurgitation is not visualized. No aortic stenosis is present. Aortic valve mean gradient measures 1.0 mmHg. Aortic valve peak gradient measures 1.4 mmHg. Aortic valve area, by VTI measures 3.56 cm. Pulmonic Valve: The pulmonic valve was grossly normal. Pulmonic valve regurgitation is trivial. Aorta: The aortic root and ascending aorta are structurally normal, with no evidence of dilitation. Venous: The inferior vena cava is dilated in size with less than 50% respiratory variability, suggesting right atrial pressure of 15 mmHg. IAS/Shunts: No atrial level shunt detected by color flow Doppler.  LEFT VENTRICLE PLAX 2D LVIDd:         7.40 cm   Diastology LVIDs:  7.10 cm   LV e' medial: 5.00 cm/s LV PW:         0.90 cm LV IVS:        0.90 cm LVOT diam:     2.20 cm LV SV:         25 LV SV Index:   14 LVOT Area:     3.80 cm  RIGHT VENTRICLE            IVC RV S prime:     9.03 cm/s  IVC diam: 2.30 cm TAPSE (M-mode): 1.3 cm LEFT ATRIUM             Index        RIGHT ATRIUM           Index LA diam:        4.10 cm 2.27 cm/m   RA Area:     22.70 cm LA Vol (A2C):    88.7 ml 49.15 ml/m  RA Volume:   82.40 ml  45.66 ml/m LA Vol (A4C):   56.1 ml 31.09 ml/m LA Biplane Vol: 70.2 ml 38.90 ml/m  AORTIC VALVE AV Area (Vmax):    3.41 cm AV Area (Vmean):   3.09 cm AV Area (VTI):     3.56 cm AV Vmax:           58.20 cm/s AV Vmean:          43.700 cm/s AV VTI:            0.070 m AV Peak Grad:      1.4 mmHg AV Mean Grad:      1.0 mmHg LVOT Vmax:         52.20 cm/s LVOT Vmean:        35.500 cm/s LVOT VTI:          0.066 m LVOT/AV VTI ratio: 0.94  AORTA Ao Root diam: 3.50 cm Ao Asc diam:  3.20 cm MR Peak grad: 67.9 mmHg   TRICUSPID VALVE MR Mean grad: 48.0 mmHg   TR Peak grad:   38.9 mmHg MR Vmax:      412.00 cm/s TR Vmax:        312.00 cm/s MR Vmean:     333.0 cm/s                           SHUNTS                           Systemic VTI:  0.07 m                           Systemic Diam: 2.20 cm Epifanio Lesches MD Electronically signed by Epifanio Lesches MD Signature Date/Time: 07/14/2023/12:48:51 PM    Final    DG Chest 2 View  Result Date: 07/14/2023 CLINICAL DATA:  SHORTNESS OF BREATH EXAM: CHEST - 2 VIEW COMPARISON:  02/24/2023 FINDINGS: Cardiac shadow is mildly enlarged. Lungs are well aerated bilaterally. Mild central vascular congestion is noted without interstitial edema. No focal infiltrate or effusion is seen. No bony abnormality is noted. IMPRESSION: Vascular congestion without significant edema. Electronically Signed   By: Alcide Clever M.D.   On: 07/14/2023 01:23    Pending Labs Unresulted Labs (From admission, onward)     Start     Ordered   07/15/23 0500  Hemoglobin A1c  Tomorrow morning,   R  07/14/23 0501   07/15/23 0500  Lipid panel  Tomorrow morning,   R        07/14/23 0501   07/15/23 0500  Hepatic function panel  Tomorrow morning,   R        07/14/23 0659   07/14/23 0800  Brain natriuretic peptide  Once,   URGENT       Question:  Release to patient  Answer:  Immediate   07/14/23 0441   07/14/23 0800  Basic metabolic panel  3 times  daily,   R      07/14/23 0545   07/14/23 0800  Calcium, ionized  Once,   R        07/14/23 0633   07/14/23 0800  D-dimer, quantitative  Once,   AD        07/14/23 0800   07/14/23 0632  Urea nitrogen, urine  Once,   R        07/14/23 0631   07/14/23 0632  Microalbumin / creatinine urine ratio  Once,   R        07/14/23 0631   07/14/23 0601  Blood gas, venous  Once,   R        07/14/23 0601   07/14/23 0556  Iron and TIBC  (Anemia Panel (PNL))  Add-on,   AD        07/14/23 0556   07/14/23 0556  Ferritin  (Anemia Panel (PNL))  Add-on,   AD        07/14/23 0556   07/14/23 0553  HIV-1/HIV-2 Qual RNA  Add-on,   AD        07/14/23 0552   07/14/23 0548  ANA w/Reflex if Positive  Add-on,   AD        07/14/23 0547   07/14/23 0548  Vitamin B1  Add-on,   AD        07/14/23 0547            Vitals/Pain Today's Vitals   07/14/23 0742 07/14/23 0743 07/14/23 1051 07/14/23 1052  BP:    101/81  Pulse:    93  Resp:    15  Temp: (!) 97.4 F (36.3 C)   97.9 F (36.6 C)  TempSrc: Oral   Oral  SpO2:    97%  Weight:      Height:      PainSc:  0-No pain 0-No pain 0-No pain    Isolation Precautions No active isolations  Medications Medications  azelastine (ASTELIN) 0.1 % nasal spray 1 spray (has no administration in time range)  fluticasone (FLONASE) 50 MCG/ACT nasal spray 1 spray (has no administration in time range)  mometasone-formoterol (DULERA) 100-5 MCG/ACT inhaler 2 puff (has no administration in time range)  ipratropium (ATROVENT) 0.03 % nasal spray 2 spray (has no administration in time range)  montelukast (SINGULAIR) tablet 10 mg (10 mg Oral Given 07/14/23 1116)  loratadine (CLARITIN) tablet 10 mg (has no administration in time range)  sodium chloride flush (NS) 0.9 % injection 3 mL (3 mLs Intravenous Given 07/14/23 1119)  sodium chloride flush (NS) 0.9 % injection 3 mL (has no administration in time range)  0.9 %  sodium chloride infusion (has no administration in time range)   acetaminophen (TYLENOL) tablet 650 mg (has no administration in time range)  ondansetron (ZOFRAN) injection 4 mg (4 mg Intravenous Given 07/14/23 0513)  enoxaparin (LOVENOX) injection 40 mg (40 mg Subcutaneous Given 07/14/23 1116)  acetaminophen (TYLENOL) tablet 650 mg (has no  administration in time range)  furosemide (LASIX) injection 20 mg (20 mg Intravenous Given 07/14/23 1237)  nitroGLYCERIN (NITROSTAT) SL tablet 0.4 mg (has no administration in time range)  morphine (PF) 2 MG/ML injection 1 mg (has no administration in time range)  levalbuterol (XOPENEX) nebulizer solution 0.63 mg (has no administration in time range)  perflutren lipid microspheres (DEFINITY) IV suspension (4 mLs Intravenous Given 07/14/23 1225)  furosemide (LASIX) injection 40 mg (40 mg Intravenous Given 07/14/23 0430)    Mobility walks     Focused Assessments Pulmonary Assessment Handoff:  Lung sounds:   O2 Device: Room Air      R Recommendations: See Admitting Provider Note  Report given to:   Additional Notes: patient is not having any shortness of breath and he is on lasix 20 mg iV

## 2023-07-14 NOTE — TOC Initial Note (Addendum)
Transition of Care [facility]) - Initial/Assessment Note    Patient Details  Name: Joel York MRN: [id number] Date of Birth: 03-05-1986  Transition of Care [facility]) CM/SW Contact:    Reva Bores, LCSWA Phone Number: 07/14/2023, 2:16 PM  Clinical Narrative: CSW met with pt at bedside in ED. Pt stated at this time he is currently not working. Pt stated that he does not have a PCP at this time and has not been to see anyone in a few years. Pt stated that he does not have a preference for PCP. CSW stated that she would work on finding him a PCP and scheduling a follow appointment to establish care. TOC will continue to follow.               Expected Discharge Plan: Home/Self Care Barriers to Discharge: Continued Medical Work up   Patient Goals and CMS Choice Patient states their goals for this hospitalization and ongoing recovery are:: return home with family          Expected Discharge Plan and Services       Living arrangements for the past 2 months: Single Family Home                                      Prior Living Arrangements/Services Living arrangements for the past 2 months: Single Family Home Lives with:: Spouse, Minor Children Patient language and need for interpreter reviewed:: Yes Do you feel safe going back to the place where you live?: Yes      Need for Family Participation in Patient Care: No (Comment) Care giver support system in place?: Yes (comment)   Criminal Activity/Legal Involvement Pertinent to Current Situation/Hospitalization: No - Comment as needed  Activities of Daily Living      Permission Sought/Granted                  Emotional Assessment Appearance:: Appears stated age Attitude/Demeanor/Rapport: Engaged Affect (typically observed): Appropriate Orientation: : Oriented to Self, Oriented to Place, Oriented to  Time, Oriented to Situation Alcohol / Substance Use: Tobacco Use Psych Involvement: No (comment)  Admission  diagnosis:  Acute pulmonary edema (HCC) [J81.0] Peripheral edema [R60.0] CHF exacerbation (HCC) [I50.9] Patient Active Problem List   Diagnosis Date Noted   AKI (acute kidney injury) (HCC) 07/14/2023   Acute on chronic systolic CHF (congestive heart failure) (HCC) 07/14/2023   Elevated troponin 07/14/2023   Sinus tachycardia 07/14/2023   PVC (premature ventricular contraction) 07/14/2023   Acute decompensated heart failure (HCC) 07/14/2023   History of asthma 07/14/2023   Myocardial injury 07/14/2023   Polycythemia 07/14/2023   Chest pain 07/14/2023   Transaminitis 07/14/2023   Elevated bilirubin 07/14/2023   CHF exacerbation (HCC) 07/14/2023   ADD (attention deficit disorder) 08/29/2015   Visit for preventive health examination 02/21/2015   Mild persistent asthma 02/14/2015   Attention deficit disorder 01/24/2011   SEIZURE DISORDER 09/14/2010   ANXIETY STATE, UNSPECIFIED 11/06/2009   BICEPS TENDINITIS, LEFT 11/22/2008   ASTHMA 09/26/2008   PCP:  Oneita Hurt, No Pharmacy:   [id number] [place], [place] - [address] AT [facility] OF [address] [place] [address] Phone: [phone] Fax: [phone]  [facility] [address] [place] [address] Phone: [phone] Fax: [phone]     Social Determinants of Health (SDOH) Social History: SDOH  Screenings   Food Insecurity: No Food Insecurity (07/14/2023)  Housing: Low Risk  (07/14/2023)  Transportation Needs: No Transportation Needs (07/14/2023)  Utilities: Not At Risk (07/14/2023)  Alcohol Screen: Low Risk  (07/14/2023)  Financial Resource Strain: High Risk (07/14/2023)  Physical Activity: Sufficiently Active (07/14/2023)  Stress: Stress Concern Present (07/14/2023)  Tobacco Use: High Risk (07/14/2023)   SDOH Interventions:     Readmission Risk Interventions     No data to display

## 2023-07-14 NOTE — H&P (Addendum)
History and Physical    Joel York ZOX:[id number] DOB: May 09, 1986 DOA: 07/14/2023  PCP: Pcp, No   Patient coming from: Home   Chief Complaint:  Chief Complaint  Patient presents with   Leg Swelling   Shortness of Breath    HPI:  Joel York is a 37 y.o. male with medical history significant of recurrent sinusitis, chronic smoker and asthma presented emergency department complaining of worsening bilateral lower extremity swelling over the course of past 2 weeks.  Patient also has occasional chest pressure and associated nausea.  He is also in nursing generalized fatigue for last couple of months.  Patient has progressive orthopnea, dyspnea and PND.  Patient denies retrosternal chest pain, palpitation, diaphoresis and headache.  Patient reported he has history of transient hypertension 10 years ago due to work-related distress and he was treating with amlodipine however once he resigned from his work history is improved and blood pressure improved as well.  Not on any blood pressure regimen.  Denies any previous history of heart attack and stroke.  Family history significant for hypertension in parents and CHF of grand mother.  Denies any family history of sudden cardiac death.  ED Course:  ED initial presentation heart rate 64 however which trended up to 112.  Respiratory rate 16, blood pressure 117/84 and O2 sat 97% room air.  CBC showed elevated WBC 12.2, RBC 6.22, hemoglobin 18.3, hematocrit 55.9 and platelet 271. CMP showed sodium 137, potassium 3.7, chloride 105, low bicarb 19, blood glucose 109, creatinine 1.6, elevated AST/ALT 74/73, ALP 141, bilirubin 1.3 and GFR 57. Elevated BNP 2494. High sensitive troponin 39>43. VBG showed pH 7.5, pCO2 27, pO2 206, low calcium 0.85, elevated hematocrit 55 and hemoglobin 18. EKG showed sinus tachycardia with frequent PVC. Chest x-ray showed enlarged heart shallowed.  Made Central vascular congestion without significant pulmonary edema. With  concern for underlying CHF exacerbation patient has been treated with Lasix 40 mg IV in the ED. Hospitalist team has been resulted to admit patient for suspected underlying CHF and CHF exacerbation workup.  Review of Systems:  Review of Systems  Constitutional:  Positive for malaise/fatigue. Negative for chills, fever and weight loss.  Respiratory:  Positive for cough. Negative for sputum production.   Cardiovascular:  Positive for orthopnea, leg swelling and PND. Negative for chest pain, palpitations and claudication.  Gastrointestinal:  Negative for heartburn, nausea and vomiting.  Neurological:  Negative for dizziness, weakness and headaches.    Past Medical History:  Diagnosis Date   Acid reflux    ADHD (attention deficit hyperactivity disorder)    Asthma    Back pain    Seizures (HCC)    Resolved    Past Surgical History:  Procedure Laterality Date   CHOLECYSTECTOMY     WISDOM TOOTH EXTRACTION       reports that he has been smoking. His smokeless tobacco use includes chew. He reports that he does not currently use alcohol. He reports that he does not currently use drugs.  Allergies  Allergen Reactions   Other Anaphylaxis    Tree Nuts   Peanuts [Peanut Oil] Anaphylaxis    Family History  Problem Relation Age of Onset   Crohn's disease Maternal Grandmother    Crohn's disease Maternal Uncle    Hypertension Mother        Living   Congestive Heart Failure Maternal Grandmother    Hypertension Other        Maternal Aunts & Uncles    Prior to Admission  medications   Medication Sig Start Date End Date Taking? Authorizing Provider  albuterol (PROVENTIL HFA;VENTOLIN HFA) 108 (90 Base) MCG/ACT inhaler Inhale 2 puffs into the lungs every 6 (six) hours as needed for wheezing or shortness of breath. Shortness of breath 01/19/16   Waldon Merl, PA-C  azelastine (ASTELIN) 0.1 % nasal spray Place 1 spray into both nostrils 2 (two) times daily. Use in each nostril as directed  05/27/23   Wallis Bamberg, PA-C  calcium carbonate (TUMS - DOSED IN MG ELEMENTAL CALCIUM) 500 MG chewable tablet Chew 1 tablet by mouth daily as needed. Upset stomach    [provider]  cefdinir (OMNICEF) 300 MG capsule Take 1 capsule (300 mg total) by mouth 2 (two) times daily. 05/27/23   Wallis Bamberg, PA-C  fluticasone (FLONASE) 50 MCG/ACT nasal spray Place 1 spray into both nostrils daily. 04/30/23   Radford Pax, NP  Fluticasone-Salmeterol (ADVAIR) 100-50 MCG/DOSE AEPB Inhale 1 puff into the lungs every 12 (twelve) hours. 02/14/15   Waldon Merl, PA-C  HYDROcodone-acetaminophen (NORCO/VICODIN) 5-325 MG tablet Take 1-2 tablets by mouth every 4 (four) hours as needed. 11/01/15   Marlon Pel, PA-C  ipratropium (ATROVENT) 0.03 % nasal spray Place 2 sprays into both nostrils 2 (two) times daily. 05/27/23   Wallis Bamberg, PA-C  levocetirizine (XYZAL) 5 MG tablet Take 1 tablet (5 mg total) by mouth every evening. 03/12/23   Wallis Bamberg, PA-C  montelukast (SINGULAIR) 10 MG tablet Take 1 tablet (10 mg total) by mouth every morning. 02/14/15   Waldon Merl, PA-C  ondansetron (ZOFRAN-ODT) 4 MG disintegrating tablet Take 1 tablet (4 mg total) by mouth every 8 (eight) hours as needed for nausea or vomiting. 02/24/23   Minna Antis, MD  predniSONE (DELTASONE) 20 MG tablet Take 2 tablets daily with breakfast. 05/27/23   Wallis Bamberg, PA-C     Physical Exam: Vitals:   07/14/23 0245 07/14/23 0445 07/14/23 0449 07/14/23 0641  BP: (!) 116/90 (!) 109/95    Pulse: (!) 112 (!) 110    Resp: 10 16    Temp:   98 F (36.7 C) 98.9 F (37.2 C)  TempSrc:   Oral Oral  SpO2: 100% 98%    Weight:      Height:        Physical Exam Constitutional:      General: He is not in acute distress.    Appearance: He is not ill-appearing.  Cardiovascular:     Rate and Rhythm: Regular rhythm. Tachycardia present.  Pulmonary:     Effort: Pulmonary effort is normal.     Breath sounds: Normal breath sounds. No  decreased breath sounds, wheezing, rhonchi or rales.  Chest:     Chest wall: No tenderness.  Abdominal:     Palpations: Abdomen is soft.  Musculoskeletal:     Cervical back: Normal range of motion.     Right lower leg: Edema present.     Left lower leg: Edema present.  Skin:    General: Skin is warm.     Capillary Refill: Capillary refill takes less than 2 seconds.  Neurological:     Mental Status: He is alert and oriented to person, place, and time.  Psychiatric:        Mood and Affect: Mood normal. Mood is not anxious.      Labs on Admission: I have personally reviewed following labs and imaging studies  CBC: Recent Labs  Lab 07/14/23 0101 07/14/23 0511 07/14/23 0620  WBC  12.2* 10.1  --   HGB 18.3* 18.4* 18.7*  HCT 55.9* 55.6* 55.0*  MCV 89.9 88.7  --   PLT 271 261  --    Basic Metabolic Panel: Recent Labs  Lab 07/14/23 0101 07/14/23 0620  NA 137 139  K 3.7 4.4  CL 105  --   CO2 19*  --   GLUCOSE 109*  --   BUN 12  --   CREATININE 1.60*  --   CALCIUM 8.9  --    GFR: Estimated Creatinine Clearance: 59.7 mL/min (A) (by C-G formula based on SCr of 1.6 mg/dL (H)). Liver Function Tests: Recent Labs  Lab 07/14/23 0101  AST 73*  ALT 74*  ALKPHOS 141*  BILITOT 1.3*  PROT 6.5  ALBUMIN 3.5   No results for input(s): "LIPASE", "AMYLASE" in the last 168 hours. No results for input(s): "AMMONIA" in the last 168 hours. Coagulation Profile: No results for input(s): "INR", "PROTIME" in the last 168 hours. Cardiac Enzymes: Recent Labs  Lab 07/14/23 0101 07/14/23 0303  TROPONINIHS 39* 43*   BNP (last 3 results) Recent Labs    07/14/23 0101  BNP 2,494.6*   HbA1C: No results for input(s): "HGBA1C" in the last 72 hours. CBG: No results for input(s): "GLUCAP" in the last 168 hours. Lipid Profile: No results for input(s): "CHOL", "HDL", "LDLCALC", "TRIG", "CHOLHDL", "LDLDIRECT" in the last 72 hours. Thyroid Function Tests: No results for input(s): "TSH",  "T4TOTAL", "FREET4", "T3FREE", "THYROIDAB" in the last 72 hours. Anemia Panel: No results for input(s): "VITAMINB12", "FOLATE", "FERRITIN", "TIBC", "IRON", "RETICCTPCT" in the last 72 hours. Urine analysis:    Component Value Date/Time   COLORURINE YELLOW 02/21/2015 0932   APPEARANCEUR CLEAR 02/21/2015 0932   LABSPEC 1.020 02/21/2015 0932   PHURINE 6.0 02/21/2015 0932   GLUCOSEU NEGATIVE 02/21/2015 0932   HGBUR NEGATIVE 02/21/2015 0932   BILIRUBINUR NEGATIVE 02/21/2015 0932   KETONESUR NEGATIVE 02/21/2015 0932   PROTEINUR NEGATIVE 04/29/2012 1035   UROBILINOGEN 0.2 02/21/2015 0932   NITRITE NEGATIVE 02/21/2015 0932   LEUKOCYTESUR NEGATIVE 02/21/2015 0932    Radiological Exams on Admission: I have personally reviewed images DG Chest 2 View  Result Date: 07/14/2023 CLINICAL DATA:  SHORTNESS OF BREATH EXAM: CHEST - 2 VIEW COMPARISON:  02/24/2023 FINDINGS: Cardiac shadow is mildly enlarged. Lungs are well aerated bilaterally. Mild central vascular congestion is noted without interstitial edema. No focal infiltrate or effusion is seen. No bony abnormality is noted. IMPRESSION: Vascular congestion without significant edema. Electronically Signed   By: Alcide Clever M.D.   On: 07/14/2023 01:23    EKG: My personal interpretation of EKG shows: Sinus tachycardia with frequent PVC.  No ST and T wave abnormality.    Assessment/Plan: Principal Problem:   Acute decompensated heart failure (HCC) Active Problems:   AKI (acute kidney injury) (HCC)   Acute on chronic systolic CHF (congestive heart failure) (HCC)   Elevated troponin   Sinus tachycardia   PVC (premature ventricular contraction)   History of asthma   Myocardial injury   Polycythemia   Chest pain   Transaminitis   Elevated bilirubin    Assessment and Plan: Elevated BNP Bilateral lower extremity edema Concern for acute decompensated heart failure - Patient coming with complaining of progressive bilateral lower extremities  swelling for last 2 weeks.  He has also dyspnea orthopnea and PND.  Occasional sputum production.  Patient has no known history of CHF.  Remote history of reactive hypertension from stress  10 years ago. - Elevated  BNP 2494. -Chest x-ray showed enlarged cardiac shallowed with mid vascular congestion - With the concern for underlying CHF exacerbation in the ED patient received Lasix 40 mg IV. - Consulted cardiology Dr. Arsenio Loader.  Recommended workup to find out underlying CHF.  Check TSH, HIV, lipid panel, A1c, iron panel, lactate.  May consider additional workup based on initial workup to include ANA, viral panel, thiamine, date [place], cardiac MRI, endomyocardial biopsy to rule out myocarditis .  Recommended to get echocardiogram with bubble study.  Possible ischemic workup in upcoming days either heart cath versus coronary CTA.  And keep Lasix every 6 hours until euvolemic and volume status  more than -2 L daily. -Obtaining echocardiogram with bubble study. - Continue Lasix 20 mg IV every 6 hours for 1 day.  Continue to monitor volume status and adjust Lasix accordingly. -Per cardiology if echocardiog shows normal EF consider obtaining CTA chest to rule out PE.  - Will follow-up with echocardiogram.  Per cardiology if echocardiogram shows significantly compromised EF reach out to cardiology and cardiology will admit the patient under their care. -Admitting patient to cardiac telemetry bed -CHF order set has been utilized - Continue monitoring strict I's/O. - Continue fluid restriction 2 L/day and salt restriction 2 g/day. -Goal of fluid volume -2 L -Appreciate cardiology input.  Elevated troponin - Troponin trended up to 39-43.  EKG showed sinus tachycardia with frequent PVC.  Per cardiology not meet criteria of NSTMI as - Elevated troponin secondary from in the setting of acute CHF exacerbation and demand ischemia. -Trending troponin and ordered morning EKG. -Continue telemonitoring  Sinus  tachycardia with frequent PVC - EKG showed sinus tachycardia with PVC.  Unclear if CHF has been developed the PVC or PVC developed CHF. -Will continue to monitor  Prerenal AKI - Elevated creatinine 1.6.  Baseline normal renal function - Prerenal AKI in the setting of CHF exacerbation versus patient has cardiorenal syndrome in the setting of CHF with possible low EF - Treating with diuretics - Continue to monitor renal function - Checking urine sodium, urine urea and urine creatinine - Continue to monitor urine output - Avoid nephrotoxic agent   Transaminitis Elevated bilirubin - Elevated AST 70, ALT 74 and ALP 141. Elevated bilirubin 1.3.  Patient denies any abdominal pain.  On no history of chronic alcohol use. - Transaminitis and elevated bilirubin in the setting of CHF exacerbation  -Continue to monitor hepatic panel.   Non-anion gap metabolic acidosis - Low bicarb 19 and anion gap have been within normal range. - Another metabolic acidosis in the setting of acute kidney injury. - Continue to monitor  Thrombocytosis Polycythemia Patient has elevated WBC count and polycythemia.  Will continue to monitor - Will consider hematology consult in AM.  History of asthma Stable.  Patient has no wheezing on physical exam. - Continue home azelastine nasal spray, Flonase nasal spray, Atrovent 2 spray twice daily, Dulera 2 puff twice daily and singular 10 mg daily. - Continue on Xopenex as needed   Chronic smoking - Patient reported to smoking around 6 cigarettes in a day. - Counseled patient at bedside for smoking cessation.  DVT prophylaxis:  Lovenox Code Status:  Full Code Diet: Regular diet fluid restriction 2 L salt restriction 2 g/day Family Communication: Discussed treatment plan both with patient and his wife at bedside. Disposition Plan: Based on disease progression and course plan to discharge to home Consults: Cardiology  Admission status:   Inpatient, Telemetry  bed  Severity of Illness: The  appropriate patient status for this patient is INPATIENT. Inpatient status is judged to be reasonable and necessary in order to provide the required intensity of service to ensure the patient's safety. The patient's presenting symptoms, physical exam findings, and initial radiographic and laboratory data in the context of their chronic comorbidities is felt to place them at high risk for further clinical deterioration. Furthermore, it is not anticipated that the patient will be medically stable for discharge from the hospital within 2 midnights of admission.   * I certify that at the point of admission it is my clinical judgment that the patient will require inpatient hospital care spanning beyond 2 midnights from the point of admission due to high intensity of service, high risk for further deterioration and high frequency of surveillance required.Marland Kitchen    Tereasa Coop, MD [facility]  How to contact the [facility] Attending or Consulting provider 7A - 7P or covering provider during after hours 7P -7A, for this patient.  Check the care team in [facility] and look for a) attending/consulting TRH provider listed and b) the [facility] team listed Log into [email or web address] and use [place]'s universal password to access. If you do not have the password, please contact the hospital operator. Locate the [facility] provider you are looking for under [facility] and page to a number that you can be directly reached. If you still have difficulty reaching the provider, please page the [facility] (Director on Call) for the Hospitalists listed on amion for assistance.  07/14/2023, 6:56 AM

## 2023-07-14 NOTE — Telephone Encounter (Signed)
Called pt and left vm to call office back to schedule hospital follow-up. Pt will be discharged 07/26.

## 2023-07-14 NOTE — Consult Note (Addendum)
Cardiology Consultation:   Patient ID: Joel York MRN: [id number]; DOB: 1986/06/05  Admit date: 07/14/2023 Date of Consult: 07/14/2023  Primary Care Provider: Oneita Hurt No [facility] Cardiologist: None  [facility] Electrophysiologist:  None    Patient Profile:   Joel York is a 37 y.o. male with a hx of seizures, asthma, and ADD who is being seen today for the evaluation of heart failure at the request of Subrina Sundil.  History of Present Illness:   Mr. Mcadory presents today with multiple symptoms.  For the past couple of months he has been tired and fatigued. He thought it was stress of having 2 children and stress at work.  In the past 2 weeks, he has had progressive SOB with walking up stairs and then also lying flat. His SOB feels better when he elevates his head. He then started developing fluid in his feet and legs over the past 3-4 days. He has had intermittent chest pains from "time to time" that is worse with rolling on a certaine side or if agitated. He has had nausea for 2 weeks. He has gotten chest tightness of with exertion. He was at the beach with his family, but after returning home, his mom told him he needed to go to the hospital.  In the ED, he was found to have AKI with Cr 1.6 (baseline 1.0), BNP 2494.6, and troponins 39->43. His CBC was notable for polycythemia with Hgb 18.3.  He smoked 3-4 cigarettes per day. Does not drink alcohol. Denies drug use.  Past Medical History:  Diagnosis Date   Acid reflux    ADHD (attention deficit hyperactivity disorder)    Asthma    Back pain    Seizures (HCC)    Resolved    Past Surgical History:  Procedure Laterality Date   CHOLECYSTECTOMY     WISDOM TOOTH EXTRACTION       Home Medications:  Prior to Admission medications   Medication Sig Start Date End Date Taking? Authorizing Provider  albuterol (PROVENTIL HFA;VENTOLIN HFA) 108 (90 Base) MCG/ACT inhaler Inhale 2 puffs into the lungs every 6 (six) hours as  needed for wheezing or shortness of breath. Shortness of breath 01/19/16  Yes Waldon Merl, PA-C  calcium carbonate (TUMS - DOSED IN MG ELEMENTAL CALCIUM) 500 MG chewable tablet Chew 1 tablet by mouth daily as needed for indigestion.   Yes [provider]  fluticasone (FLONASE) 50 MCG/ACT nasal spray Place 1 spray into both nostrils daily. Patient taking differently: Place 1 spray into both nostrils daily as needed for allergies. 04/30/23  Yes Radford Pax, NP  Fluticasone-Salmeterol (ADVAIR) 100-50 MCG/DOSE AEPB Inhale 1 puff into the lungs every 12 (twelve) hours. Patient taking differently: Inhale 1 puff into the lungs daily as needed (for shortness of breath). 02/14/15  Yes Marcelline Mates C, PA-C  ipratropium (ATROVENT) 0.03 % nasal spray Place 2 sprays into both nostrils 2 (two) times daily. Patient taking differently: Place 2 sprays into both nostrils 2 (two) times daily as needed for rhinitis. 05/27/23  Yes Wallis Bamberg, PA-C  levocetirizine (XYZAL) 5 MG tablet Take 1 tablet (5 mg total) by mouth every evening. Patient taking differently: Take 5 mg by mouth daily as needed for allergies. 03/12/23  Yes Wallis Bamberg, PA-C  ondansetron (ZOFRAN-ODT) 4 MG disintegrating tablet Take 1 tablet (4 mg total) by mouth every 8 (eight) hours as needed for nausea or vomiting. 02/24/23  Yes Minna Antis, MD  SUBOXONE 8-2 MG FILM Place 1 Film  under the tongue daily. 07/07/23  Yes [provider]  azelastine (ASTELIN) 0.1 % nasal spray Place 1 spray into both nostrils 2 (two) times daily. Use in each nostril as directed Patient not taking: Reported on 07/14/2023 05/27/23   Wallis Bamberg, PA-C    Inpatient Medications: Scheduled Meds:  Continuous Infusions:  PRN Meds:   Allergies:    Allergies  Allergen Reactions   Other Anaphylaxis    Tree Nuts   Peanuts [Peanut Oil]     Social History:   Social History   Socioeconomic History   Marital status: Married    Spouse name: Not on  file   Number of children: Not on file   Years of education: Not on file   Highest education level: Not on file  Occupational History   Not on file  Tobacco Use   Smoking status: Light Smoker   Smokeless tobacco: Current    Types: Chew  Substance and Sexual Activity   Alcohol use: Not Currently   Drug use: Not Currently    Comment: last use "many years ago"   Sexual activity: Yes  Other Topics Concern   Not on file  Social History Narrative   Not on file   Social Determinants of Health   Financial Resource Strain: Not on file  Food Insecurity: Not on file  Transportation Needs: Not on file  Physical Activity: Not on file  Stress: Not on file  Social Connections: Not on file  Intimate Partner Violence: Not on file    Family History:   Family History  Problem Relation Age of Onset   Crohn's disease Maternal Grandmother    Crohn's disease Maternal Uncle    Hypertension Mother        Living   Congestive Heart Failure Maternal Grandmother    Hypertension Other        Maternal Aunts & Uncles     ROS:  Review of Systems: [y] = yes, [ ]  = no      General: Weight gain [ ] ; Weight loss [ ] ; Anorexia [ ] ; Fatigue [y]; Fever [ ] ; Chills [ ] ; Weakness [ ]    Cardiac: Chest pain/pressure [y]; Resting SOB [y]; Exertional SOB [y]; Orthopnea [y]; Pedal Edema [ ] ; Palpitations [ ] ; Syncope [ ] ; Presyncope [ ] ; Paroxysmal nocturnal dyspnea [ ]    Pulmonary: Cough [ ] ; Wheezing [ ] ; Hemoptysis [ ] ; Sputum [ ] ; Snoring [ ]    GI: Vomiting [ ] ; Dysphagia [ ] ; Melena [ ] ; Hematochezia [ ] ; Heartburn [ ] ; Abdominal pain [ ] ; Constipation [ ] ; Diarrhea [ ] ; BRBPR [ ]    GU: Hematuria [ ] ; Dysuria [ ] ; Nocturia [ ]  Vascular: Pain in legs with walking [ ] ; Pain in feet with lying flat [ ] ; Non-healing sores [ ] ; Stroke [ ] ; TIA [ ] ; Slurred speech [ ] ;   Neuro: Headaches [ ] ; Vertigo [ ] ; Seizures [ ] ; Paresthesias [ ] ;Blurred vision [ ] ; Diplopia [ ] ; Vision changes [ ]    Ortho/Skin: Arthritis  [ ] ; Joint pain [ ] ; Muscle pain [ ] ; Joint swelling [ ] ; Back Pain [ ] ; Rash [ ]    Psych: Depression [ ] ; Anxiety [ ]    Heme: Bleeding problems [ ] ; Clotting disorders [ ] ; Anemia [ ]    Endocrine: Diabetes [ ] ; Thyroid dysfunction [ ]    Physical Exam/Data:   Vitals:   07/14/23 0031 07/14/23 0051 07/14/23 0055 07/14/23 0245  BP: 117/84   (!) 116/90  Pulse: 64   (!) 112  Resp: 19   10  Temp: 97.7 F (36.5 C)     TempSrc: Oral     SpO2: 97% 97%  100%  Weight:   69.4 kg   Height:   5\' 7"  (1.702 m)    No intake or output data in the 24 hours ending 07/14/23 0445    07/14/2023   12:55 AM 02/24/2023    9:56 PM 11/01/2015   11:12 PM  Last 3 Weights  Weight (lbs) 153 lb 153 lb 150 lb  Weight (kg) 69.4 kg 69.4 kg 68.04 kg     Body mass index is 23.96 kg/m.  General:  Well nourished, well developed, in no acute distress HEENT: normal Lymph: no adenopathy Neck: no JVD Endocrine:  No thryomegaly Vascular: No carotid bruits; FA pulses 2+ bilaterally without bruits  Cardiac:  normal S1, S2; tachycardic; no murmur; + RV heave Lungs:  clear to auscultation bilaterally, no wheezing, rhonchi or rales  Abd: soft, nontender, no hepatomegaly  Ext: 1+ LE edema extending up to the knees Musculoskeletal:  No deformities, BUE and BLE strength normal and equal Skin: warm and dry  Neuro:  CNs 2-12 intact, no focal abnormalities noted Psych:  Normal affect   EKG:  The EKG was personally reviewed and demonstrates:  LAFB, PVCs Telemetry:  Telemetry was personally reviewed and demonstrates:  ~5-15% PVC burden  Laboratory Data:  High Sensitivity Troponin:   Recent Labs  Lab 07/14/23 0101 07/14/23 0303  TROPONINIHS 39* 43*     Chemistry Recent Labs  Lab 07/14/23 0101  NA 137  K 3.7  CL 105  CO2 19*  GLUCOSE 109*  BUN 12  CREATININE 1.60*  CALCIUM 8.9  GFRNONAA 57*  ANIONGAP 13    Recent Labs  Lab 07/14/23 0101  PROT 6.5  ALBUMIN 3.5  AST 73*  ALT 74*  ALKPHOS 141*  BILITOT  1.3*   Hematology Recent Labs  Lab 07/14/23 0101  WBC 12.2*  RBC 6.22*  HGB 18.3*  HCT 55.9*  MCV 89.9  MCH 29.4  MCHC 32.7  RDW 14.4  PLT 271   BNP Recent Labs  Lab 07/14/23 0101  BNP 2,494.6*    DDimer No results for input(s): "DDIMER" in the last 168 hours.  Radiology/Studies:  DG Chest 2 View  Result Date: 07/14/2023 CLINICAL DATA:  SHORTNESS OF BREATH EXAM: CHEST - 2 VIEW COMPARISON:  02/24/2023 FINDINGS: Cardiac shadow is mildly enlarged. Lungs are well aerated bilaterally. Mild central vascular congestion is noted without interstitial edema. No focal infiltrate or effusion is seen. No bony abnormality is noted. IMPRESSION: Vascular congestion without significant edema. Electronically Signed   By: Alcide Clever M.D.   On: 07/14/2023 01:23   **Personally reviewed, mildly enlarged cardiac silhouette, pulmonary edema** {  Assessment and Plan:   Acute Decompensated Heart Failure Presenting with clinical symptoms of acute heart failure with orthopnea, PND, exertional dyspnea, dyspnea at rest, n/v, and LE edema. Pending TTE. Etiologies on Ddx include ischemic (gets chest tightness with exertion), valvular, stress-induced (takatsubo), PVC induced (at least 5-15% on tele), thyroid dysfunction, polycythemia associated, myocarditis, and shunting (w/ polycythemia). On exam he is cool and wet, and would benefit from IV diuresis while workup is continued. - Lab Workup: TSH, HIV, lipid panel A1c, iron panel, lactate now. Can consider additional w/u based on initial workup (e.g. ANA, viral panel, thiamine, genetic testing, cMRI, endomyocardial biopsy to r/o myocarditis) - Echocardiogram with bubble - Ischemic w/u in  upcoming days with either LHC/RHC vs coronary CTA - Telemetry for PVC burden - Daily Weights, 2L fluid restriction while diuresing - RX:  - Agree with IV lasix q6h until euvolemic, ensure >2L negative daily  - Hold GDMT addition until echo and once no longer  decompensated  2. Acute vs Chronic Myocardial Injury Found to have elevated troponins iso acute heart failure. 39 -> 43. Not c/w NSTEMI given delta < 20%. No need for heparinization or further trending of troponins.  3. AKI C/f cardiorenal physiology with acute heart failure, and thus would benefit from continued diuresis with close monitoring of Cr - diuresis as above, trend Cr  4. 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 PV alone can lead to chronic micro-vascular ischemia and HF, although his PV could be secondary to HF and suspect less likely is the primary driver. - Consider hematology consultation - Would obtain CO level to ensure smoking not contributor  7. Tobacco Use Smoking 3-6 cigarettes daily. Had discussion with patient, he is motivated to quit. - Smoking cessation counseling      For questions or updates, please contact [facility] Please consult [email or web address] for contact info under     Signed, Freddy Finner, MD  07/14/2023 4:45 AM

## 2023-07-14 NOTE — Consult Note (Addendum)
Advanced Heart Failure Team Consult Note   Primary Physician: Pcp, No PCP-Cardiologist:  None  Reason for Consultation: Acute systolic heart failure  HPI:   Joel York is seen today for evaluation of acute systolic heart failure at the request of Dr. Pearlie Oyster, hospital medicine.   Joel York is a 37 y.o. male with HTN, ADD, asthma, hx drug abuse (now on suboxone), and seizures. He presented to the ED today with worsening BLE edema, SOB with exertion, and chest pressure that has worsened over the weekend. He had been noticing these symptoms for the past month but attributed them to overwork and stress. Chest pressure was transient, would happen with positional changes at times. He noticed he would get SOB after 4-5 flights of stairs (which is unlike him). Has been having BLE edema that would initially resolve with elevation but failed to go away this weekend. Snores. Has two younger kids that keep him active at home as well as worked full time as a Energy manager. Able to still to cross fit and lift weights. Only on Suboxone at home. Says he has Adderall prescribed at home but has not taken for several months. Only cardiac family history that he knows of is that his grandmother has CHF.   In the ED labs significant for TSH 4.9, Hgb 18.7, SCr 1.6 (baseline around 1), AST 80, ALT 74, CO2 23, K 3.9, Mg 1.6, HsTrop 39>43>37>40. EKG showed ST 120s with PVCs. Echo EF <20%,  RV mildly reduced, RV mod enlarged, estimated RV systolic pressure 53.47mmHg, LA mod dilated, RA mod dilated, mild-mod MR/TR. Has been diuresing well with IV lasix.   Resting comfortably in bed.   Home Medications Prior to Admission medications   Medication Sig Start Date End Date Taking? Authorizing Provider  albuterol (PROVENTIL HFA;VENTOLIN HFA) 108 (90 Base) MCG/ACT inhaler Inhale 2 puffs into the lungs every 6 (six) hours as needed for wheezing or shortness of breath. Shortness of breath 01/19/16   Yes Waldon Merl, PA-C  calcium carbonate (TUMS - DOSED IN MG ELEMENTAL CALCIUM) 500 MG chewable tablet Chew 1 tablet by mouth daily as needed for indigestion.   Yes [provider]  fluticasone (FLONASE) 50 MCG/ACT nasal spray Place 1 spray into both nostrils daily. Patient taking differently: Place 1 spray into both nostrils daily as needed for allergies. 04/30/23  Yes Radford Pax, NP  Fluticasone-Salmeterol (ADVAIR) 100-50 MCG/DOSE AEPB Inhale 1 puff into the lungs every 12 (twelve) hours. Patient taking differently: Inhale 1 puff into the lungs daily as needed (for shortness of breath). 02/14/15  Yes Marcelline Mates C, PA-C  ipratropium (ATROVENT) 0.03 % nasal spray Place 2 sprays into both nostrils 2 (two) times daily. Patient taking differently: Place 2 sprays into both nostrils 2 (two) times daily as needed for rhinitis. 05/27/23  Yes Wallis Bamberg, PA-C  levocetirizine (XYZAL) 5 MG tablet Take 1 tablet (5 mg total) by mouth every evening. Patient taking differently: Take 5 mg by mouth daily as needed for allergies. 03/12/23  Yes Wallis Bamberg, PA-C  ondansetron (ZOFRAN-ODT) 4 MG disintegrating tablet Take 1 tablet (4 mg total) by mouth every 8 (eight) hours as needed for nausea or vomiting. 02/24/23  Yes Minna Antis, MD  SUBOXONE 8-2 MG FILM Place 1 Film under the tongue daily. 07/07/23  Yes [provider]  azelastine (ASTELIN) 0.1 % nasal spray Place 1 spray into both nostrils 2 (two) times daily. Use in each nostril as directed Patient not  taking: Reported on 07/14/2023 05/27/23   Wallis Bamberg, PA-C    Past Medical History: Past Medical History:  Diagnosis Date   Acid reflux    ADHD (attention deficit hyperactivity disorder)    Asthma    Back pain    Seizures (HCC)    Resolved    Past Surgical History: Past Surgical History:  Procedure Laterality Date   CHOLECYSTECTOMY     WISDOM TOOTH EXTRACTION      Family History: Family History  Problem Relation Age  of Onset   Crohn's disease Maternal Grandmother    Crohn's disease Maternal Uncle    Hypertension Mother        Living   Congestive Heart Failure Maternal Grandmother    Hypertension Other        Maternal Aunts & Uncles    Social History: Social History   Socioeconomic History   Marital status: Married    Spouse name: Not on file   Number of children: Not on file   Years of education: Not on file   Highest education level: Not on file  Occupational History   Not on file  Tobacco Use   Smoking status: Light Smoker   Smokeless tobacco: Current    Types: Chew  Substance and Sexual Activity   Alcohol use: Not Currently   Drug use: Not Currently    Comment: last use "many years ago"   Sexual activity: Yes  Other Topics Concern   Not on file  Social History Narrative   Not on file   Social Determinants of Health   Financial Resource Strain: Not on file  Food Insecurity: Not on file  Transportation Needs: Not on file  Physical Activity: Not on file  Stress: Not on file  Social Connections: Not on file    Allergies:  Allergies  Allergen Reactions   Other Anaphylaxis    Tree Nuts   Peanuts [Peanut Oil] Anaphylaxis    Objective:    Vital Signs:   Temp:  [97.4 F (36.3 C)-98.9 F (37.2 C)] 97.9 F (36.6 C) (07/22 1052) Pulse Rate:  [56-112] 93 (07/22 1052) Resp:  [10-19] 15 (07/22 1052) BP: (101-117)/(81-99) 101/81 (07/22 1052) SpO2:  [96 %-100 %] 97 % (07/22 1052) Weight:  [69.4 kg] 69.4 kg (07/22 0055)    Weight change: Filed Weights   07/14/23 0055  Weight: 69.4 kg    Intake/Output:   Intake/Output Summary (Last 24 hours) at 07/14/2023 1331 Last data filed at 07/14/2023 1057 Gross per 24 hour  Intake --  Output 1845 ml  Net -1845 ml    Physical Exam  General:  well appearing.  No respiratory difficulty HEENT: normal Neck: supple. JVD ~10 cm. Carotids 2+ bilat; no bruits. No lymphadenopathy or thyromegaly appreciated. Cor: PMI nondisplaced.  Tachy regular rate & rhythm. No rubs, gallops or murmurs. +S3 Lungs: clear Abdomen: soft, nontender, nondistended. No hepatosplenomegaly. No bruits or masses. Good bowel sounds. Extremities: no cyanosis, clubbing, rash, +1 BLE edema  Neuro: alert & oriented x 3, cranial nerves grossly intact. moves all 4 extremities w/o difficulty. Affect pleasant.  Telemetry   ST 120s with PVCs (Personally reviewed)    EKG    ST 120s, PVCs, BAE   Labs   Basic Metabolic Panel: Recent Labs  Lab 07/14/23 0101 07/14/23 0611 07/14/23 0620 07/14/23 0730  NA 137 139 139  --   K 3.7 3.9 4.4  --   CL 105 104  --   --   CO2 19*  23  --   --   GLUCOSE 109* 118*  --   --   BUN 12 12  --   --   CREATININE 1.60* 1.62*  --   --   CALCIUM 8.9 8.5*  --   --   MG  --   --   --  1.6*    Liver Function Tests: Recent Labs  Lab 07/14/23 0101 07/14/23 0611  AST 73* 80*  ALT 74* 74*  ALKPHOS 141* 134*  BILITOT 1.3* 1.6*  PROT 6.5 6.2*  ALBUMIN 3.5 3.5   No results for input(s): "LIPASE", "AMYLASE" in the last 168 hours. No results for input(s): "AMMONIA" in the last 168 hours.  CBC: Recent Labs  Lab 07/14/23 0101 07/14/23 0511 07/14/23 0620  WBC 12.2* 10.1  --   HGB 18.3* 18.4* 18.7*  HCT 55.9* 55.6* 55.0*  MCV 89.9 88.7  --   PLT 271 261  --     Cardiac Enzymes: No results for input(s): "CKTOTAL", "CKMB", "CKMBINDEX", "TROPONINI" in the last 168 hours.  BNP: BNP (last 3 results) Recent Labs    07/14/23 0101  BNP 2,494.6*   ProBNP (last 3 results) No results for input(s): "PROBNP" in the last 8760 hours.  CBG: No results for input(s): "GLUCAP" in the last 168 hours.  Coagulation Studies: No results for input(s): "LABPROT", "INR" in the last 72 hours. Imaging   [place] EKG SITE RITE  Result Date: 07/14/2023 If [facility] image not attached, placement could not be confirmed due to current cardiac rhythm.  ECHOCARDIOGRAM COMPLETE  Result Date: 07/14/2023    ECHOCARDIOGRAM REPORT    Patient Name:   BLANCHARD WILLHITE Date of Exam: 07/14/2023 Medical Rec #:  [id number]    Height:       67.0 in Accession #:    [id number]   Weight:       153.0 lb Date of Birth:  [birth year]    BSA:          1.805 m Patient Age:    37 years     BP:           95/57 mmHg Patient Gender: M            HR:           112 bpm. Exam Location:  Inpatient Procedure: 2D Echo, Color Doppler, Cardiac Doppler and Intracardiac            Opacification Agent             REPORT CONTAINS CRITICAL RESULT  Results communicated to Dr Sharolyn Douglas at 1243 on 07/14/23. Indications:    Congestive Heart Failure  History:        Patient has no prior history of Echocardiogram examinations.                 CHF, Polycythemia; Arrythmias:PVC.  Sonographer:    Milbert Coulter Referring Phys: [id number] SUBRINA SUNDIL IMPRESSIONS  1. Left ventricular ejection fraction, by estimation, is <20%. The left ventricle has severely decreased function. The left ventricle demonstrates global hypokinesis. The left ventricular internal cavity size was severely dilated. Left ventricular diastolic parameters are indeterminate.  2. Right ventricular systolic function is mildly reduced. The right ventricular size is moderately enlarged. There is moderately elevated pulmonary artery systolic pressure. The estimated right ventricular systolic pressure is 53.9 mmHg.  3. Left atrial size was mildly dilated.  4. Right atrial size was moderately dilated.  5. The mitral valve is abnormal. Mild to  moderate mitral valve regurgitation.  6. Tricuspid valve regurgitation is mild to moderate.  7. The aortic valve is tricuspid. Aortic valve regurgitation is not visualized. No aortic stenosis is present.  8. The inferior vena cava is dilated in size with <50% respiratory variability, suggesting right atrial pressure of 15 mmHg. FINDINGS  Left Ventricle: Left ventricular ejection fraction, by estimation, is <20%. The left ventricle has severely decreased function. The left ventricle demonstrates  global hypokinesis. Definity contrast agent was given IV to delineate the left ventricular endocardial borders. The left ventricular internal cavity size was severely dilated. There is no left ventricular hypertrophy. Left ventricular diastolic parameters are indeterminate. Right Ventricle: The right ventricular size is moderately enlarged. No increase in right ventricular wall thickness. Right ventricular systolic function is mildly reduced. There is moderately elevated pulmonary artery systolic pressure. The tricuspid regurgitant velocity is 3.12 m/s, and with an assumed right atrial pressure of 15 mmHg, the estimated right ventricular systolic pressure is 53.9 mmHg. Left Atrium: Left atrial size was mildly dilated. Right Atrium: Right atrial size was moderately dilated. Pericardium: There is no evidence of pericardial effusion. Mitral Valve: The mitral valve is abnormal. Mild to moderate mitral valve regurgitation. Tricuspid Valve: The tricuspid valve is normal in structure. Tricuspid valve regurgitation is mild to moderate. Aortic Valve: The aortic valve is tricuspid. Aortic valve regurgitation is not visualized. No aortic stenosis is present. Aortic valve mean gradient measures 1.0 mmHg. Aortic valve peak gradient measures 1.4 mmHg. Aortic valve area, by VTI measures 3.56 cm. Pulmonic Valve: The pulmonic valve was grossly normal. Pulmonic valve regurgitation is trivial. Aorta: The aortic root and ascending aorta are structurally normal, with no evidence of dilitation. Venous: The inferior vena cava is dilated in size with less than 50% respiratory variability, suggesting right atrial pressure of 15 mmHg. IAS/Shunts: No atrial level shunt detected by color flow Doppler.  LEFT VENTRICLE PLAX 2D LVIDd:         7.40 cm   Diastology LVIDs:         7.10 cm   LV e' medial: 5.00 cm/s LV PW:         0.90 cm LV IVS:        0.90 cm LVOT diam:     2.20 cm LV SV:         25 LV SV Index:   14 LVOT Area:     3.80 cm  RIGHT  VENTRICLE            IVC RV S prime:     9.03 cm/s  IVC diam: 2.30 cm TAPSE (M-mode): 1.3 cm LEFT ATRIUM             Index        RIGHT ATRIUM           Index LA diam:        4.10 cm 2.27 cm/m   RA Area:     22.70 cm LA Vol (A2C):   88.7 ml 49.15 ml/m  RA Volume:   82.40 ml  45.66 ml/m LA Vol (A4C):   56.1 ml 31.09 ml/m LA Biplane Vol: 70.2 ml 38.90 ml/m  AORTIC VALVE AV Area (Vmax):    3.41 cm AV Area (Vmean):   3.09 cm AV Area (VTI):     3.56 cm AV Vmax:           58.20 cm/s AV Vmean:          43.700 cm/s AV VTI:  0.070 m AV Peak Grad:      1.4 mmHg AV Mean Grad:      1.0 mmHg LVOT Vmax:         52.20 cm/s LVOT Vmean:        35.500 cm/s LVOT VTI:          0.066 m LVOT/AV VTI ratio: 0.94  AORTA Ao Root diam: 3.50 cm Ao Asc diam:  3.20 cm MR Peak grad: 67.9 mmHg   TRICUSPID VALVE MR Mean grad: 48.0 mmHg   TR Peak grad:   38.9 mmHg MR Vmax:      412.00 cm/s TR Vmax:        312.00 cm/s MR Vmean:     333.0 cm/s                           SHUNTS                           Systemic VTI:  0.07 m                           Systemic Diam: 2.20 cm Epifanio Lesches MD Electronically signed by Epifanio Lesches MD Signature Date/Time: 07/14/2023/12:48:51 PM    Final    DG Chest 2 View  Result Date: 07/14/2023 CLINICAL DATA:  SHORTNESS OF BREATH EXAM: CHEST - 2 VIEW COMPARISON:  02/24/2023 FINDINGS: Cardiac shadow is mildly enlarged. Lungs are well aerated bilaterally. Mild central vascular congestion is noted without interstitial edema. No focal infiltrate or effusion is seen. No bony abnormality is noted. IMPRESSION: Vascular congestion without significant edema. Electronically Signed   By: Alcide Clever M.D.   On: 07/14/2023 01:23    Medications:   Current Medications:  azelastine  1 spray Each Nare BID   buprenorphine-naloxone  1 tablet Sublingual Daily   digoxin  0.125 mg Oral Daily   enoxaparin (LOVENOX) injection  40 mg Subcutaneous Q24H   fluticasone  1 spray Each Nare Daily    furosemide  40 mg Intravenous BID   ipratropium  2 spray Each Nare BID   loratadine  10 mg Oral QPM   mexiletine  200 mg Oral BID   mometasone-formoterol  2 puff Inhalation BID   montelukast  10 mg Oral q morning   sodium chloride flush  3 mL Intravenous Q12H   spironolactone  12.5 mg Oral Daily    Infusions:  sodium chloride      Patient Profile  Joel York is a 37 y.o. male with HTN, ADD, asthma, hx drug abuse (now on suboxone), and seizures. Admitted with acute systolic heart failure.  Assessment/Plan  Acute systolic heart failure - Echo EF <20%,  RV mildly reduced, RV mod enlarged, estimated RV systolic pressure 53.52mmHg, LA mod dilated, RA mod dilated, mild-mod MR/TR - NYHA III on admission, suspect 2/2 PVCs / uncontrolled HTN - Volume elevated on assessment.  Increase lasix 20 mg Q6 to 40 mg BID - Place PICC, follow co-ox and CVP - start spiro 12.5 mg daily - start digoxin 0.125 mcg daily - SGLT2i tomorrow, check HgbA1c - plan for cMRI once better diuresed. +/- L/RHC - HIV (-), check lipid panel - strict I&O, daily weights Hypertension - stable, can add GDMT as tolerated - plan losartan tomorrow if BP stable Chest pressure - HsTrop 39>43>37>40 - denies CP on assessment - suspect HsTrop elevated with volume  overload - can consider L/RHC once better diuresed.  PVCs - high burden noted on tele - snores, will need sleep study OP - start Mexiletine 200 mg BID - denies stimulant use - TSH 4.9, T4 0.79 - Keep K>4 and Mg >2. Mg 1.6 will replete AKI - SCr 1.02 in March, baseline - Up to 1.6 on admission - follow with diuresis Tobacco use - smokes 3-6 cigarettes / day - cessation encouraged Hx drug abuse - Continue suboxone Elevated LFTs - AST 80, ALT 74 - suspect low-output HF, will trend  Length of Stay: 0  Alen Bleacher, NP  07/14/2023, 1:31 PM  Advanced Heart Failure Team Pager [phone] (M-F; 7a - 5p)  Please contact [facility] for night-coverage  after hours (4p -7a ) and weekends on [email or web address]   Patient seen and examined with the above-signed Advanced Practice Provider and/or Housestaff. I personally reviewed laboratory data, imaging studies and relevant notes. I independently examined the patient and formulated the important aspects of the plan. I have edited the note to reflect any of my changes or salient points. I have personally discussed the plan with the patient and/or family.  37 y/o with previous drug use (now on suboxone x 59yr) admitted with nearly a month of progressive HF symptoms   At baseline very fit. Works out with [employer].   Echo with EF < 10% RV severely reduced.  + frequent PVCs  Has been having DOE and some chest pressure  Hs trop ok.   Feels better after IV diuresis.   Denies recent drug abuse. Takes adderall at times but not recently. No significant ETOH. Occasional. GM had HF.   General:  Sitting up in . No resp difficulty HEENT: normal Neck: supple. JVP 10 . Carotids 2+ bilat; no bruits. No lymphadenopathy or thryomegaly appreciated. Cor: PMI nondisplaced. Regular tachy + s3 Lungs: clear Abdomen: soft, nontender, nondistended. No hepatosplenomegaly. No bruits or masses. Good bowel sounds. Extremities: no cyanosis, clubbing, rash, tr edema Neuro: alert & orientedx3, cranial nerves grossly intact. moves all 4 extremities w/o difficulty. Affect pleasant  He has severe biventricular dysfunction with advanced HF and possibly low output. Etiology unclear. ? PVC or genetically mediated high on list   Will place PICC. Follow CVP and co-ox.    Start spiro and digoxin. Continue IV diuresis for one more day.   Will need cath and cMRI + likely genetic testing.   [name], MD  3:21 PM

## 2023-07-14 NOTE — ED Triage Notes (Signed)
Pt arrived from home via POV c/o SOB x 2 weeks that first started upon exertion has now progressed to sob at rest intermittently. Pt is also concerned that bilateral feet and ankles are notable swollen and red. Zero noted pitting in triage.

## 2023-07-14 NOTE — ED Notes (Signed)
Pt transported to Vascular for Echo study.

## 2023-07-14 NOTE — ED Notes (Signed)
Upon repeat EKG, it was noted there were some abnormalities, including some inverted T waves. EKG compared to previous performed in March, with acute changed noted. EKG handed to Dr. Clayborne Dana and I informed him of noted changes.

## 2023-07-15 ENCOUNTER — Other Ambulatory Visit (HOSPITAL_COMMUNITY): Payer: Self-pay

## 2023-07-15 DIAGNOSIS — E876 Hypokalemia: Secondary | ICD-10-CM | POA: Diagnosis not present

## 2023-07-15 DIAGNOSIS — R570 Cardiogenic shock: Secondary | ICD-10-CM | POA: Diagnosis not present

## 2023-07-15 DIAGNOSIS — I5082 Biventricular heart failure: Secondary | ICD-10-CM | POA: Diagnosis not present

## 2023-07-15 DIAGNOSIS — D509 Iron deficiency anemia, unspecified: Secondary | ICD-10-CM

## 2023-07-15 DIAGNOSIS — I110 Hypertensive heart disease with heart failure: Secondary | ICD-10-CM | POA: Diagnosis not present

## 2023-07-15 DIAGNOSIS — I509 Heart failure, unspecified: Secondary | ICD-10-CM | POA: Diagnosis not present

## 2023-07-15 DIAGNOSIS — I5021 Acute systolic (congestive) heart failure: Secondary | ICD-10-CM | POA: Diagnosis not present

## 2023-07-15 LAB — COOXEMETRY PANEL
Carboxyhemoglobin: 0.3 % — ABNORMAL LOW (ref 0.5–1.5)
Carboxyhemoglobin: 0.6 % (ref 0.5–1.5)
Carboxyhemoglobin: 4.6 % — ABNORMAL HIGH (ref 0.5–1.5)
Methemoglobin: <0.7 % (ref 0.0–1.5)
Methemoglobin: <0.7 % (ref 0.0–1.5)
Methemoglobin: <0.7 % (ref 0.0–1.5)
O2 Saturation: 30.5 %
O2 Saturation: 34.9 %
O2 Saturation: 60.6 %
Total hemoglobin: 18.7 g/dL — ABNORMAL HIGH (ref 12.0–16.0)
Total hemoglobin: 18.2 g/dL — ABNORMAL HIGH (ref 12.0–16.0)
Total hemoglobin: 14.9 g/dL (ref 12.0–16.0)

## 2023-07-15 LAB — MRSA NEXT GEN BY PCR, NASAL: MRSA by PCR Next Gen: NOT DETECTED

## 2023-07-15 LAB — COMPREHENSIVE METABOLIC PANEL
ALT: 68 U/L — ABNORMAL HIGH (ref 0–44)
AST: 57 U/L — ABNORMAL HIGH (ref 15–41)
Albumin: 3.3 g/dL — ABNORMAL LOW (ref 3.5–5.0)
Alkaline Phosphatase: 132 U/L — ABNORMAL HIGH (ref 38–126)
Anion gap: 9 (ref 5–15)
BUN: 17 mg/dL (ref 6–20)
CO2: 29 mmol/L (ref 22–32)
Calcium: 8.2 mg/dL — ABNORMAL LOW (ref 8.9–10.3)
Chloride: 98 mmol/L (ref 98–111)
Creatinine, Ser: 1.93 mg/dL — ABNORMAL HIGH (ref 0.61–1.24)
GFR, Estimated: 45 mL/min — ABNORMAL LOW (ref >60)
Glucose, Bld: 148 mg/dL — ABNORMAL HIGH (ref 70–99)
Potassium: 3.6 mmol/L (ref 3.5–5.1)
Sodium: 136 mmol/L (ref 135–145)
Total Bilirubin: 1.4 mg/dL — ABNORMAL HIGH (ref 0.3–1.2)
Total Protein: 6.2 g/dL — ABNORMAL LOW (ref 6.5–8.1)

## 2023-07-15 LAB — ANA W/REFLEX IF POSITIVE: Anti Nuclear Antibody (ANA): NEGATIVE

## 2023-07-15 LAB — LACTIC ACID, PLASMA
Lactic Acid, Venous: 2.5 mmol/L (ref 0.5–1.9)
Lactic Acid, Venous: 1.7 mmol/L (ref 0.5–1.9)

## 2023-07-15 LAB — LIPID PANEL
Cholesterol: 74 mg/dL (ref 0–200)
HDL: 16 mg/dL — ABNORMAL LOW (ref >40)
LDL Cholesterol: 30 mg/dL (ref 0–99)
Total CHOL/HDL Ratio: 4.6 RATIO
Triglycerides: 140 mg/dL (ref <150)
VLDL: 28 mg/dL (ref 0–40)

## 2023-07-15 LAB — UREA NITROGEN, URINE: Urea Nitrogen, Ur: 41 mg/dL

## 2023-07-15 LAB — MAGNESIUM: Magnesium: 2.1 mg/dL (ref 1.7–2.4)

## 2023-07-15 LAB — MICROALBUMIN / CREATININE URINE RATIO
Creatinine, Urine: 7.1 mg/dL
Microalb, Ur: <3 ug/mL — ABNORMAL HIGH

## 2023-07-15 LAB — HEMOGLOBIN A1C
Hgb A1c MFr Bld: 6 % — ABNORMAL HIGH (ref 4.8–5.6)
Mean Plasma Glucose: 125.5 mg/dL

## 2023-07-15 LAB — D-DIMER, QUANTITATIVE: D-Dimer, Quant: 0.67 ug/mL-FEU — ABNORMAL HIGH (ref 0.00–0.50)

## 2023-07-15 MED ORDER — POTASSIUM CHLORIDE CRYS ER 20 MEQ PO TBCR
40.0000 meq | EXTENDED_RELEASE_TABLET | Freq: Two times a day (BID) | ORAL | Status: AC
  Administered 2023-07-15 (×2): 40 meq via ORAL
  Filled 2023-07-15 (×2): qty 2

## 2023-07-15 MED ORDER — ALUM & MAG HYDROXIDE-SIMETH 200-200-20 MG/5ML PO SUSP
30.0000 mL | ORAL | Status: DC | PRN
  Administered 2023-07-15 – 2023-07-17 (×3): 30 mL via ORAL
  Filled 2023-07-15 (×3): qty 30

## 2023-07-15 MED ORDER — ASPIRIN 81 MG PO CHEW
81.0000 mg | CHEWABLE_TABLET | ORAL | Status: AC
  Administered 2023-07-16: 81 mg via ORAL
  Filled 2023-07-15: qty 1

## 2023-07-15 MED ORDER — FUROSEMIDE 10 MG/ML IJ SOLN
80.0000 mg | Freq: Two times a day (BID) | INTRAMUSCULAR | Status: AC
  Administered 2023-07-15 (×2): 80 mg via INTRAVENOUS
  Filled 2023-07-15 (×2): qty 8

## 2023-07-15 MED ORDER — SODIUM CHLORIDE 0.9 % IV SOLN: INTRAVENOUS | Status: DC

## 2023-07-15 MED ORDER — NOREPINEPHRINE 4 MG/250ML-% IV SOLN
3.0000 ug/min | INTRAVENOUS | Status: DC
  Administered 2023-07-15: 3 ug/min via INTRAVENOUS
  Administered 2023-07-16: 2 ug/min via INTRAVENOUS
  Administered 2023-07-18: 3 ug/min via INTRAVENOUS
  Administered 2023-07-19: 4 ug/min via INTRAVENOUS
  Administered 2023-07-19: 3 ug/min via INTRAVENOUS
  Administered 2023-07-20 – 2023-07-21 (×2): 4 ug/min via INTRAVENOUS
  Administered 2023-07-22 (×2): 2 ug/min via INTRAVENOUS
  Administered 2023-07-23: 3 ug/min via INTRAVENOUS
  Filled 2023-07-15 (×9): qty 250

## 2023-07-15 MED ORDER — POTASSIUM CHLORIDE CRYS ER 20 MEQ PO TBCR: 40.0000 meq | EXTENDED_RELEASE_TABLET | Freq: Once | ORAL | Status: DC

## 2023-07-15 MED ORDER — NOREPINEPHRINE 4 MG/250ML-% IV SOLN: 0.0000 ug/min | INTRAVENOUS | Status: DC

## 2023-07-15 MED ORDER — MILRINONE LACTATE IN DEXTROSE 20-5 MG/100ML-% IV SOLN
0.3750 ug/kg/min | INTRAVENOUS | Status: DC
  Administered 2023-07-15 – 2023-07-17 (×4): 0.25 ug/kg/min via INTRAVENOUS
  Administered 2023-07-18 – 2023-07-28 (×19): 0.375 ug/kg/min via INTRAVENOUS
  Filled 2023-07-15 (×23): qty 100

## 2023-07-15 NOTE — Progress Notes (Addendum)
Advanced Heart Failure Rounding Note  PCP-Cardiologist: None   Subjective:    Initial co-ox yesterday 59%, this morning 31%? Stat repeat ordered.   -3.6 L UOP yesterday with 40 IV lasix BID but patient states it slowed down by the evening. Weight unchanged.   CVP 20 this morning. Family at bedside. Denies CP, slept some.   Objective:   Weight Range: 67.3 kg Body mass index is 23.24 kg/m.   Vital Signs:   Temp:  [97.3 F (36.3 C)-97.9 F (36.6 C)] 97.6 F (36.4 C) (07/23 0407) Pulse Rate:  [56-96] 56 (07/22 1930) Resp:  [15-21] 18 (07/23 0407) BP: (91-117)/(78-85) 97/78 (07/23 0407) SpO2:  [95 %-100 %] 95 % (07/23 0407) Weight:  [67.2 kg-67.3 kg] 67.3 kg (07/23 0407) Last BM Date : 07/13/23  Weight change: Filed Weights   07/14/23 0055 07/14/23 1404 07/15/23 0407  Weight: 69.4 kg 67.2 kg 67.3 kg   Intake/Output:   Intake/Output Summary (Last 24 hours) at 07/15/2023 0746 Last data filed at 07/15/2023 0100 Gross per 24 hour  Intake 480 ml  Output 3125 ml  Net -2645 ml   CVP 20 Physical Exam  General:  well appearing.  No respiratory difficulty HEENT: normal Neck: supple. JVD to jaw. Carotids 2+ bilat; no bruits. No lymphadenopathy or thyromegaly appreciated. Cor: PMI nondisplaced. Tachy rate & regular rhythm. No rubs, gallops or murmurs. Lungs: clear Abdomen: soft, nontender, nondistended. No hepatosplenomegaly. No bruits or masses. Good bowel sounds. Extremities: no cyanosis, clubbing, rash, +1 BLE edema. PICC RUE Neuro: alert & oriented x 3, cranial nerves grossly intact. moves all 4 extremities w/o difficulty. Affect pleasant.   Telemetry   ST 120s 2-10 PVCs/hr (Personally reviewed)    EKG    No new EKG to review  Labs    CBC Recent Labs    07/14/23 0101 07/14/23 0511 07/14/23 0620  WBC 12.2* 10.1  --   HGB 18.3* 18.4* 18.7*  HCT 55.9* 55.6* 55.0*  MCV 89.9 88.7  --   PLT 271 261  --    Basic Metabolic Panel Recent Labs     07/14/23 0611 07/14/23 0620 07/14/23 0730 07/15/23 0504  NA 139 139  --  136  K 3.9 4.4  --  3.6  CL 104  --   --  98  CO2 23  --   --  29  GLUCOSE 118*  --   --  148*  BUN 12  --   --  17  CREATININE 1.62*  --   --  1.93*  CALCIUM 8.5*  --   --  8.2*  MG  --   --  1.6* 2.1   Liver Function Tests Recent Labs    07/14/23 0611 07/15/23 0504  AST 80* 57*  ALT 74* 68*  ALKPHOS 134* 132*  BILITOT 1.6* 1.4*  PROT 6.2* 6.2*  ALBUMIN 3.5 3.3*   No results for input(s): "LIPASE", "AMYLASE" in the last 72 hours. Cardiac Enzymes No results for input(s): "CKTOTAL", "CKMB", "CKMBINDEX", "TROPONINI" in the last 72 hours.  BNP: BNP (last 3 results) Recent Labs    07/14/23 0101  BNP 1,810.8*  2,494.6*    ProBNP (last 3 results) No results for input(s): "PROBNP" in the last 8760 hours.   D-Dimer Recent Labs    07/15/23 0504  DDIMER 0.67*   Hemoglobin A1C Recent Labs    07/15/23 0504  HGBA1C 6.0*   Fasting Lipid Panel Recent Labs    07/15/23 0504  CHOL  74  HDL 16*  LDLCALC 30  TRIG 161  CHOLHDL 4.6   Thyroid Function Tests Recent Labs    07/14/23 0611  TSH 4.982*   Other results:  Imaging   [place] EKG SITE RITE  Result Date: 07/14/2023 If Site Rite image not attached, placement could not be confirmed due to current cardiac rhythm.  ECHOCARDIOGRAM COMPLETE  Result Date: 07/14/2023    ECHOCARDIOGRAM REPORT   Patient Name:   AVEER BARTOW Date of Exam: 07/14/2023 Medical Rec #:  [id number]    Height:       67.0 in Accession #:    [id number]   Weight:       153.0 lb Date of Birth:  02-Nov-1986    BSA:          1.805 m Patient Age:    37 years     BP:           95/57 mmHg Patient Gender: M            HR:           112 bpm. Exam Location:  Inpatient Procedure: 2D Echo, Color Doppler, Cardiac Doppler and Intracardiac            Opacification Agent             REPORT CONTAINS CRITICAL RESULT  Results communicated to Dr Sharolyn Douglas at 1243 on 07/14/23. Indications:     Congestive Heart Failure  History:        Patient has no prior history of Echocardiogram examinations.                 CHF, Polycythemia; Arrythmias:PVC.  Sonographer:    Milbert Coulter Referring Phys: [id number] SUBRINA SUNDIL IMPRESSIONS  1. Left ventricular ejection fraction, by estimation, is <20%. The left ventricle has severely decreased function. The left ventricle demonstrates global hypokinesis. The left ventricular internal cavity size was severely dilated. Left ventricular diastolic parameters are indeterminate.  2. Right ventricular systolic function is mildly reduced. The right ventricular size is moderately enlarged. There is moderately elevated pulmonary artery systolic pressure. The estimated right ventricular systolic pressure is 53.9 mmHg.  3. Left atrial size was mildly dilated.  4. Right atrial size was moderately dilated.  5. The mitral valve is abnormal. Mild to moderate mitral valve regurgitation.  6. Tricuspid valve regurgitation is mild to moderate.  7. The aortic valve is tricuspid. Aortic valve regurgitation is not visualized. No aortic stenosis is present.  8. The inferior vena cava is dilated in size with <50% respiratory variability, suggesting right atrial pressure of 15 mmHg. FINDINGS  Left Ventricle: Left ventricular ejection fraction, by estimation, is <20%. The left ventricle has severely decreased function. The left ventricle demonstrates global hypokinesis. Definity contrast agent was given IV to delineate the left ventricular endocardial borders. The left ventricular internal cavity size was severely dilated. There is no left ventricular hypertrophy. Left ventricular diastolic parameters are indeterminate. Right Ventricle: The right ventricular size is moderately enlarged. No increase in right ventricular wall thickness. Right ventricular systolic function is mildly reduced. There is moderately elevated pulmonary artery systolic pressure. The tricuspid regurgitant velocity is 3.12  m/s, and with an assumed right atrial pressure of 15 mmHg, the estimated right ventricular systolic pressure is 53.9 mmHg. Left Atrium: Left atrial size was mildly dilated. Right Atrium: Right atrial size was moderately dilated. Pericardium: There is no evidence of pericardial effusion. Mitral Valve: The mitral valve is abnormal. Mild to moderate mitral valve regurgitation.  Tricuspid Valve: The tricuspid valve is normal in structure. Tricuspid valve regurgitation is mild to moderate. Aortic Valve: The aortic valve is tricuspid. Aortic valve regurgitation is not visualized. No aortic stenosis is present. Aortic valve mean gradient measures 1.0 mmHg. Aortic valve peak gradient measures 1.4 mmHg. Aortic valve area, by VTI measures 3.56 cm. Pulmonic Valve: The pulmonic valve was grossly normal. Pulmonic valve regurgitation is trivial. Aorta: The aortic root and ascending aorta are structurally normal, with no evidence of dilitation. Venous: The inferior vena cava is dilated in size with less than 50% respiratory variability, suggesting right atrial pressure of 15 mmHg. IAS/Shunts: No atrial level shunt detected by color flow Doppler.  LEFT VENTRICLE PLAX 2D LVIDd:         7.40 cm   Diastology LVIDs:         7.10 cm   LV e' medial: 5.00 cm/s LV PW:         0.90 cm LV IVS:        0.90 cm LVOT diam:     2.20 cm LV SV:         25 LV SV Index:   14 LVOT Area:     3.80 cm  RIGHT VENTRICLE            IVC RV S prime:     9.03 cm/s  IVC diam: 2.30 cm TAPSE (M-mode): 1.3 cm LEFT ATRIUM             Index        RIGHT ATRIUM           Index LA diam:        4.10 cm 2.27 cm/m   RA Area:     22.70 cm LA Vol (A2C):   88.7 ml 49.15 ml/m  RA Volume:   82.40 ml  45.66 ml/m LA Vol (A4C):   56.1 ml 31.09 ml/m LA Biplane Vol: 70.2 ml 38.90 ml/m  AORTIC VALVE AV Area (Vmax):    3.41 cm AV Area (Vmean):   3.09 cm AV Area (VTI):     3.56 cm AV Vmax:           58.20 cm/s AV Vmean:          43.700 cm/s AV VTI:            0.070 m AV  Peak Grad:      1.4 mmHg AV Mean Grad:      1.0 mmHg LVOT Vmax:         52.20 cm/s LVOT Vmean:        35.500 cm/s LVOT VTI:          0.066 m LVOT/AV VTI ratio: 0.94  AORTA Ao Root diam: 3.50 cm Ao Asc diam:  3.20 cm MR Peak grad: 67.9 mmHg   TRICUSPID VALVE MR Mean grad: 48.0 mmHg   TR Peak grad:   38.9 mmHg MR Vmax:      412.00 cm/s TR Vmax:        312.00 cm/s MR Vmean:     333.0 cm/s                           SHUNTS                           Systemic VTI:  0.07 m  Systemic Diam: 2.20 cm Epifanio Lesches MD Electronically signed by Epifanio Lesches MD Signature Date/Time: 07/14/2023/12:48:51 PM    Final    Medications:   Scheduled Medications:  buprenorphine-naloxone  1 tablet Sublingual Daily   Chlorhexidine Gluconate Cloth  6 each Topical Daily   digoxin  0.125 mg Oral Daily   enoxaparin (LOVENOX) injection  40 mg Subcutaneous Q24H   ipratropium  1 spray Each Nare BID   loratadine  10 mg Oral QPM   mexiletine  200 mg Oral BID   mometasone-formoterol  2 puff Inhalation BID   montelukast  10 mg Oral q morning   sodium chloride flush  10-40 mL Intracatheter Q12H   sodium chloride flush  3 mL Intravenous Q12H   spironolactone  12.5 mg Oral Daily    Infusions:  sodium chloride      PRN Medications: sodium chloride, acetaminophen, acetaminophen, levalbuterol, nitroGLYCERIN, ondansetron (ZOFRAN) IV, sodium chloride flush, sodium chloride flush  Patient Profile   Dwayne Bulkley is a 37 y.o. male with HTN, ADD, asthma, hx drug abuse (now on suboxone x5 yrs), and seizures. Admitted with acute systolic heart failure.   Assessment/Plan  Acute biventricular systolic heart failure - Echo EF <20%,  RV mildly reduced, RV mod enlarged, estimated RV systolic pressure 53.14mmHg, LA mod dilated, RA mod dilated, mild-mod MR/TR - NYHA III on admission, suspect 2/2 PVCs / uncontrolled HTN / genetic?, myocarditis? (Seen in ED for recurrent sinusitis/viral illness in March)  -  Volume elevated on assessment.  CVP 20 this morning, increase lasix to 80 IV BID - Co-ox 30%? This morning. STAT repeat. - Start milrinone 0.125, follow co-ox. May need additional pressure support but levo will worsen HR. Follow milrinone response for now.  - Hold spiro today, SBP in 90s - Continue digoxin 0.25 mcg daily - SGLT2i when renal function improves, A1c 6 - check lactic acid - L/RHC scheduled for tomorrow, may need to push back to Thursday until better diuresed, will discuss with MD. cMRI after - HIV (-),LDL 30 - will need genetic testing OP - strict I&O, daily weights  Hypertension - soft this morning, can add GDMT as tolerated  Chest pressure - HsTrop 39>43>37>40 - denies CP on assessment - suspect HsTrop elevated with volume overload - LHC once better diuresed  PVCs - high burden noted on tele - snores, will need sleep study OP - Continue Mexiletine 200 mg BID, improved but may worsen with milrinone. Will monitor - denies stimulant use - TSH 4.9, T4 0.79 - Keep K>4 and Mg >2 - K 3.6, replete, Mg 2.1  AKI - SCr 1.02 in March, baseline - Up to 1.8 on admission - 1.9 today, should improved with inotropic support - follow with diuresis  Tobacco use - smokes 3-6 cigarettes / day - cessation encouraged  Hx drug abuse - Continue suboxone  Elevated LFTs - AST 80>57, ALT 74>68 - suspect low-output HF, will trend  9. Iron deficiency - Anemia panel 7/24 with tSat 20 and ferritin 49 - will need feraheme prior to discharge - only AFTER cMRI  Length of Stay: 1  Alen Bleacher, NP  07/15/2023, 7:46 AM  Advanced Heart Failure Team Pager [phone] (M-F; 7a - 5p)  Please contact [facility] for night-coverage after hours (5p -7a ) and weekends on [email or web address]   Agree with above.   Developed low output HF this am. Co-ox 30s. Milrinone started. Moving to ICU   Denies CP or SOB currently  General: Sitting up in  bed No resp difficulty HEENT: normal Neck:  supple. JVp to jaw. . Carotids 2+ bilat; no bruits. No lymphadenopathy or thryomegaly appreciated. Cor: PMI laterally displaced. Tachy regualr +s3 Lungs: clear Abdomen: soft, nontender, nondistended. No hepatosplenomegaly. No bruits or masses. Good bowel sounds. Extremities: no cyanosis, clubbing, rash, edema Neuro: alert & orientedx3, cranial nerves grossly intact. moves all 4 extremities w/o difficulty. Affect pleasant  Very tenuous with low output and marked tachycardia. Will continue milrinone. Move to ICU. Plan R/L cath tomorrow and cMRI.   I am concerned that he may need to be transferred to [facility] for transplant evaluation. I d/w him and his family.   CRITICAL CARE Performed by: [name]  Total critical care time: 45 minutes  Critical care time was exclusive of separately billable procedures and treating other patients.  Critical care was necessary to treat or prevent imminent or life-threatening deterioration.  Critical care was time spent personally by me (independent of midlevel providers or residents) on the following activities: development of treatment plan with patient and/or surrogate as well as nursing, discussions with consultants, evaluation of patient's response to treatment, examination of patient, obtaining history from patient or surrogate, ordering and performing treatments and interventions, ordering and review of laboratory studies, ordering and review of radiographic studies, pulse oximetry and re-evaluation of patient's condition.  [name], MD  2:30 PM

## 2023-07-15 NOTE — Progress Notes (Signed)
Patient report given to receiving RN in 2H. This RN and CN ready to transport patient to 2H, but patient stated that he is not ready to leave yet, as he is waiting for his 36 year old son to come visit before patient goes to [facility]. This RN asked ETA for the son to be here, patient replied about one hour. This RN notified patient that he has new medication ordered that has to be started in ICU. Patient verbalized understanding.  Elnita Maxwell, RN

## 2023-07-15 NOTE — TOC Progression Note (Signed)
Transition of Care [facility]) - Progression Note    Patient Details  Name: Joel York MRN: [id number] Date of Birth: Jun 30, 1986  [facility] CM/SW Contact  Nicanor Bake Phone Number: 07/15/2023, 10:29 AM  Clinical Narrative:  CSW scheduled a PCP appointment for Wednesday, July 23, 2023 @ 9:20 am [facility] [place]. TOC will continue following.     Expected Discharge Plan: Home/Self Care Barriers to Discharge: Continued Medical Work up  Expected Discharge Plan and Services       Living arrangements for the past 2 months: Single Family Home                                       Social Determinants of Health (SDOH) Interventions SDOH Screenings   Food Insecurity: No Food Insecurity (07/14/2023)  Housing: Low Risk  (07/14/2023)  Transportation Needs: No Transportation Needs (07/14/2023)  Utilities: Not At Risk (07/14/2023)  Alcohol Screen: Low Risk  (07/14/2023)  Financial Resource Strain: High Risk (07/14/2023)  Physical Activity: Sufficiently Active (07/14/2023)  Stress: Stress Concern Present (07/14/2023)  Tobacco Use: High Risk (07/14/2023)    Readmission Risk Interventions     No data to display

## 2023-07-15 NOTE — Progress Notes (Signed)
PROGRESS NOTE  Joel York WJX:[id number] DOB: 1986-05-19 DOA: 07/14/2023 PCP: Pcp, No  HPI/Recap of past 24 hours: Joel York is a 37 y.o. male with medical history significant of recurrent sinusitis, chronic smoker and asthma presented to the ED, c/o worsening BLE edema, progressive orthopnea, dyspnea and PND X 2 weeks. Patient also reported occasional chest pressure and associated nausea. In the ED, VSS, labs showed WBC 12.2, hemoglobin 18.3, creatinine 1.6, elevated AST/ALT 74/73, ALP 141, bilirubin 1.3, BNP 2494, troponin 39>43. EKG showed sinus tachycardia with frequent PVC. CXR showed vascular congestion without significant pulmonary edema.  Patient admitted for further management.    Today, pt denies any chest pain, worsening SOB. Family at bedside. Transferred to the ICU by the HF team for some pressors and further management. TRH will sign off    Assessment/Plan: Principal Problem:   Acute decompensated heart failure (HCC) Active Problems:   AKI (acute kidney injury) (HCC)   Acute on chronic systolic CHF (congestive heart failure) (HCC)   Elevated troponin   Sinus tachycardia   PVC (premature ventricular contraction)   History of asthma   Myocardial injury   Polycythemia   Chest pain   Transaminitis   Elevated bilirubin   CHF exacerbation (HCC)   Acute systolic HF Pulmonary HTN BNP 2130 Troponin 39>43, EKG showed sinus tachycardia with frequent PVC CXR showed vascular congestion without significant pulmonary edema ECHO with EF of <20%, left ventricle demonstrates global hypokinesis. Left ventricular  diastolic parameters are indeterminate. There is moderately elevated pulmonary artery systolic pressure. The estimated right ventricular systolic pressure is 53.9 mmHg Advanced heart failure team consulted, appreciate recs Continue lasix, digoxin, mexiletine, started on milrinone and levophed (transferred to ICU) Continue monitoring strict I's/O, daily weights    Elevated troponin Currently chest pain free Troponin trended up to 39-43 Likely demand ischemia in the setting of acute CHF exacerbation Cardiology on board   Sinus tachycardia with frequent PVC EKG showed sinus tachycardia with PVC Cardiology started Mexiletine   Hypomagnesemia/Hypokalemia Replace prn   AKI Creatinine 1.9.  Baseline normal renal function Likely cardiorenal Monitor closely while on diuresis  HTN Stable  Transaminitis Elevated bilirubin Likely hepatic congestion in the setting of HF Elevated AST 70, ALT 74 and ALP 141, bilirubin 1.3    Polycythemia Ongoing Daily CBC and monitor   History of asthma Stable Continue home azelastine nasal spray, Flonase nasal spray, Atrovent 2 spray twice daily, Dulera 2 puff twice daily and singular 10 mg daily Continue on Xopenex as needed   Tobacco abuse Patient reported to smoking around 6 cigarettes in a day Counseled patient at bedside for smoking cessation  Hx of opoid dependence  Continue suboxone    Estimated body mass index is 23.24 kg/m as calculated from the following:   Height as of this encounter: 5\' 7"  (1.702 m).   Weight as of this encounter: 67.3 kg.     Code Status: Full  Family Communication: Wife and baby at bedside  Disposition Plan: Status is: Inpatient Remains inpatient appropriate because: Level of care      Consultants: HF/Cardiology  Procedures: None  Antimicrobials: None  DVT prophylaxis:  Lovenox   Objective: Vitals:   07/15/23 0850 07/15/23 0851 07/15/23 0905 07/15/23 1000  BP:  97/83 104/81 100/73  Pulse:  (!) 120 (!) 121 (!) 112  Resp: 16 11  18   Temp:  97.7 F (36.5 C)    TempSrc:  Oral    SpO2:  100%    Weight:  Height:        Intake/Output Summary (Last 24 hours) at 07/15/2023 1344 Last data filed at 07/15/2023 1201 Gross per 24 hour  Intake 480 ml  Output 4575 ml  Net -4095 ml   Filed Weights   07/14/23 0055 07/14/23 1404 07/15/23 0407   Weight: 69.4 kg 67.2 kg 67.3 kg    Exam: General: NAD  Cardiovascular: S1, S2 present Respiratory: CTAB Abdomen: Soft, nontender, nondistended, bowel sounds present Musculoskeletal: No bilateral pedal edema noted Skin: Normal Psychiatry: Normal mood     Data Reviewed: CBC: Recent Labs  Lab 07/14/23 0101 07/14/23 0511 07/14/23 0620  WBC 12.2* 10.1  --   HGB 18.3* 18.4* 18.7*  HCT 55.9* 55.6* 55.0*  MCV 89.9 88.7  --   PLT 271 261  --    Basic Metabolic Panel: Recent Labs  Lab 07/14/23 0101 07/14/23 0530 07/14/23 0611 07/14/23 0620 07/14/23 0730 07/15/23 0504  NA 137 140 139 139  --  136  K 3.7 3.5 3.9 4.4  --  3.6  CL 105 101 104  --   --  98  CO2 19* 28 23  --   --  29  GLUCOSE 109* 105* 118*  --   --  148*  BUN 12 12 12   --   --  17  CREATININE 1.60* 1.80* 1.62*  --   --  1.93*  CALCIUM 8.9 8.7* 8.5*  --   --  8.2*  MG  --   --   --   --  1.6* 2.1   GFR: Estimated Creatinine Clearance: 49.5 mL/min (A) (by C-G formula based on SCr of 1.93 mg/dL (H)). Liver Function Tests: Recent Labs  Lab 07/14/23 0101 07/14/23 0611 07/15/23 0504  AST 73* 80* 57*  ALT 74* 74* 68*  ALKPHOS 141* 134* 132*  BILITOT 1.3* 1.6* 1.4*  PROT 6.5 6.2* 6.2*  ALBUMIN 3.5 3.5 3.3*   No results for input(s): "LIPASE", "AMYLASE" in the last 168 hours. No results for input(s): "AMMONIA" in the last 168 hours. Coagulation Profile: No results for input(s): "INR", "PROTIME" in the last 168 hours. Cardiac Enzymes: No results for input(s): "CKTOTAL", "CKMB", "CKMBINDEX", "TROPONINI" in the last 168 hours. BNP (last 3 results) No results for input(s): "PROBNP" in the last 8760 hours. HbA1C: Recent Labs    07/15/23 0504  HGBA1C 6.0*   CBG: No results for input(s): "GLUCAP" in the last 168 hours. Lipid Profile: Recent Labs    07/15/23 0504  CHOL 74  HDL 16*  LDLCALC 30  TRIG 161  CHOLHDL 4.6   Thyroid Function Tests: Recent Labs    07/14/23 0611  TSH 4.982*   FREET4 0.79   Anemia Panel: Recent Labs    07/14/23 0502 07/14/23 0530 07/14/23 0611 07/14/23 0739  VITAMINB12  --   --   --  597  FOLATE  --   --  18.5  --   FERRITIN  --  49  --   --   TIBC  --  456*  --   --   IRON  --  90  --   --   RETICCTPCT 2.0  --   --   --    Urine analysis:    Component Value Date/Time   COLORURINE YELLOW 02/21/2015 0932   APPEARANCEUR CLEAR 02/21/2015 0932   LABSPEC 1.020 02/21/2015 0932   PHURINE 6.0 02/21/2015 0932   GLUCOSEU NEGATIVE 02/21/2015 0932   HGBUR NEGATIVE 02/21/2015 0932   BILIRUBINUR NEGATIVE  02/21/2015 0932   KETONESUR NEGATIVE 02/21/2015 0932   PROTEINUR NEGATIVE 04/29/2012 1035   UROBILINOGEN 0.2 02/21/2015 0932   NITRITE NEGATIVE 02/21/2015 0932   LEUKOCYTESUR NEGATIVE 02/21/2015 0932   Sepsis Labs: @LABRCNTIP (procalcitonin:4,lacticidven:4)  )No results found for this or any previous visit (from the past 240 hour(s)).    Studies: No results found.  Scheduled Meds:  buprenorphine-naloxone  1 tablet Sublingual Daily   Chlorhexidine Gluconate Cloth  6 each Topical Daily   digoxin  0.125 mg Oral Daily   enoxaparin (LOVENOX) injection  40 mg Subcutaneous Q24H   furosemide  80 mg Intravenous BID   ipratropium  1 spray Each Nare BID   loratadine  10 mg Oral QPM   mexiletine  200 mg Oral BID   mometasone-formoterol  2 puff Inhalation BID   montelukast  10 mg Oral q morning   potassium chloride  40 mEq Oral BID   sodium chloride flush  10-40 mL Intracatheter Q12H   sodium chloride flush  3 mL Intravenous Q12H    Continuous Infusions:  sodium chloride     milrinone 0.25 mcg/kg/min (07/15/23 0903)   norepinephrine (LEVOPHED) Adult infusion       LOS: 1 day     Briant Cedar, MD [facility]  If 7PM-7AM, please contact night-coverage [email or web address] 07/15/2023, 1:44 PM

## 2023-07-15 NOTE — H&P (View-Only) (Signed)
Advanced Heart Failure Rounding Note  PCP-Cardiologist: None   Subjective:    Initial co-ox yesterday 59%, this morning 31%? Stat repeat ordered.   -3.6 L UOP yesterday with 40 IV lasix BID but patient states it slowed down by the evening. Weight unchanged.   CVP 20 this morning. Family at bedside. Denies CP, slept some.   Objective:   Weight Range: 67.3 kg Body mass index is 23.24 kg/m.   Vital Signs:   Temp:  [97.3 F (36.3 C)-97.9 F (36.6 C)] 97.6 F (36.4 C) (07/23 0407) Pulse Rate:  [56-96] 56 (07/22 1930) Resp:  [15-21] 18 (07/23 0407) BP: (91-117)/(78-85) 97/78 (07/23 0407) SpO2:  [95 %-100 %] 95 % (07/23 0407) Weight:  [67.2 kg-67.3 kg] 67.3 kg (07/23 0407) Last BM Date : 07/13/23  Weight change: Filed Weights   07/14/23 0055 07/14/23 1404 07/15/23 0407  Weight: 69.4 kg 67.2 kg 67.3 kg   Intake/Output:   Intake/Output Summary (Last 24 hours) at 07/15/2023 0746 Last data filed at 07/15/2023 0100 Gross per 24 hour  Intake 480 ml  Output 3125 ml  Net -2645 ml   CVP 20 Physical Exam  General:  well appearing.  No respiratory difficulty HEENT: normal Neck: supple. JVD to jaw. Carotids 2+ bilat; no bruits. No lymphadenopathy or thyromegaly appreciated. Cor: PMI nondisplaced. Tachy rate & regular rhythm. No rubs, gallops or murmurs. Lungs: clear Abdomen: soft, nontender, nondistended. No hepatosplenomegaly. No bruits or masses. Good bowel sounds. Extremities: no cyanosis, clubbing, rash, +1 BLE edema. PICC RUE Neuro: alert & oriented x 3, cranial nerves grossly intact. moves all 4 extremities w/o difficulty. Affect pleasant.   Telemetry   ST 120s 2-10 PVCs/hr (Personally reviewed)    EKG    No new EKG to review  Labs    CBC Recent Labs    07/14/23 0101 07/14/23 0511 07/14/23 0620  WBC 12.2* 10.1  --   HGB 18.3* 18.4* 18.7*  HCT 55.9* 55.6* 55.0*  MCV 89.9 88.7  --   PLT 271 261  --    Basic Metabolic Panel Recent Labs     07/14/23 0611 07/14/23 0620 07/14/23 0730 07/15/23 0504  NA 139 139  --  136  K 3.9 4.4  --  3.6  CL 104  --   --  98  CO2 23  --   --  29  GLUCOSE 118*  --   --  148*  BUN 12  --   --  17  CREATININE 1.62*  --   --  1.93*  CALCIUM 8.5*  --   --  8.2*  MG  --   --  1.6* 2.1   Liver Function Tests Recent Labs    07/14/23 0611 07/15/23 0504  AST 80* 57*  ALT 74* 68*  ALKPHOS 134* 132*  BILITOT 1.6* 1.4*  PROT 6.2* 6.2*  ALBUMIN 3.5 3.3*   No results for input(s): "LIPASE", "AMYLASE" in the last 72 hours. Cardiac Enzymes No results for input(s): "CKTOTAL", "CKMB", "CKMBINDEX", "TROPONINI" in the last 72 hours.  BNP: BNP (last 3 results) Recent Labs    07/14/23 0101  BNP 1,810.8*  2,494.6*    ProBNP (last 3 results) No results for input(s): "PROBNP" in the last 8760 hours.   D-Dimer Recent Labs    07/15/23 0504  DDIMER 0.67*   Hemoglobin A1C Recent Labs    07/15/23 0504  HGBA1C 6.0*   Fasting Lipid Panel Recent Labs    07/15/23 0504  CHOL  74  HDL 16*  LDLCALC 30  TRIG 161  CHOLHDL 4.6   Thyroid Function Tests Recent Labs    07/14/23 0611  TSH 4.982*   Other results:  Imaging   [place] EKG SITE RITE  Result Date: 07/14/2023 If Site Rite image not attached, placement could not be confirmed due to current cardiac rhythm.  ECHOCARDIOGRAM COMPLETE  Result Date: 07/14/2023    ECHOCARDIOGRAM REPORT   Patient Name:   Joel York Date of Exam: 07/14/2023 Medical Rec #:  [id number]    Height:       67.0 in Accession #:    [id number]   Weight:       153.0 lb Date of Birth:  02-Nov-1986    BSA:          1.805 m Patient Age:    37 years     BP:           95/57 mmHg Patient Gender: M            HR:           112 bpm. Exam Location:  Inpatient Procedure: 2D Echo, Color Doppler, Cardiac Doppler and Intracardiac            Opacification Agent             REPORT CONTAINS CRITICAL RESULT  Results communicated to Dr Sharolyn Douglas at 1243 on 07/14/23. Indications:     Congestive Heart Failure  History:        Patient has no prior history of Echocardiogram examinations.                 CHF, Polycythemia; Arrythmias:PVC.  Sonographer:    Milbert Coulter Referring Phys: [id number] SUBRINA SUNDIL IMPRESSIONS  1. Left ventricular ejection fraction, by estimation, is <20%. The left ventricle has severely decreased function. The left ventricle demonstrates global hypokinesis. The left ventricular internal cavity size was severely dilated. Left ventricular diastolic parameters are indeterminate.  2. Right ventricular systolic function is mildly reduced. The right ventricular size is moderately enlarged. There is moderately elevated pulmonary artery systolic pressure. The estimated right ventricular systolic pressure is 53.9 mmHg.  3. Left atrial size was mildly dilated.  4. Right atrial size was moderately dilated.  5. The mitral valve is abnormal. Mild to moderate mitral valve regurgitation.  6. Tricuspid valve regurgitation is mild to moderate.  7. The aortic valve is tricuspid. Aortic valve regurgitation is not visualized. No aortic stenosis is present.  8. The inferior vena cava is dilated in size with <50% respiratory variability, suggesting right atrial pressure of 15 mmHg. FINDINGS  Left Ventricle: Left ventricular ejection fraction, by estimation, is <20%. The left ventricle has severely decreased function. The left ventricle demonstrates global hypokinesis. Definity contrast agent was given IV to delineate the left ventricular endocardial borders. The left ventricular internal cavity size was severely dilated. There is no left ventricular hypertrophy. Left ventricular diastolic parameters are indeterminate. Right Ventricle: The right ventricular size is moderately enlarged. No increase in right ventricular wall thickness. Right ventricular systolic function is mildly reduced. There is moderately elevated pulmonary artery systolic pressure. The tricuspid regurgitant velocity is 3.12  m/s, and with an assumed right atrial pressure of 15 mmHg, the estimated right ventricular systolic pressure is 53.9 mmHg. Left Atrium: Left atrial size was mildly dilated. Right Atrium: Right atrial size was moderately dilated. Pericardium: There is no evidence of pericardial effusion. Mitral Valve: The mitral valve is abnormal. Mild to moderate mitral valve regurgitation.  Tricuspid Valve: The tricuspid valve is normal in structure. Tricuspid valve regurgitation is mild to moderate. Aortic Valve: The aortic valve is tricuspid. Aortic valve regurgitation is not visualized. No aortic stenosis is present. Aortic valve mean gradient measures 1.0 mmHg. Aortic valve peak gradient measures 1.4 mmHg. Aortic valve area, by VTI measures 3.56 cm. Pulmonic Valve: The pulmonic valve was grossly normal. Pulmonic valve regurgitation is trivial. Aorta: The aortic root and ascending aorta are structurally normal, with no evidence of dilitation. Venous: The inferior vena cava is dilated in size with less than 50% respiratory variability, suggesting right atrial pressure of 15 mmHg. IAS/Shunts: No atrial level shunt detected by color flow Doppler.  LEFT VENTRICLE PLAX 2D LVIDd:         7.40 cm   Diastology LVIDs:         7.10 cm   LV e' medial: 5.00 cm/s LV PW:         0.90 cm LV IVS:        0.90 cm LVOT diam:     2.20 cm LV SV:         25 LV SV Index:   14 LVOT Area:     3.80 cm  RIGHT VENTRICLE            IVC RV S prime:     9.03 cm/s  IVC diam: 2.30 cm TAPSE (M-mode): 1.3 cm LEFT ATRIUM             Index        RIGHT ATRIUM           Index LA diam:        4.10 cm 2.27 cm/m   RA Area:     22.70 cm LA Vol (A2C):   88.7 ml 49.15 ml/m  RA Volume:   82.40 ml  45.66 ml/m LA Vol (A4C):   56.1 ml 31.09 ml/m LA Biplane Vol: 70.2 ml 38.90 ml/m  AORTIC VALVE AV Area (Vmax):    3.41 cm AV Area (Vmean):   3.09 cm AV Area (VTI):     3.56 cm AV Vmax:           58.20 cm/s AV Vmean:          43.700 cm/s AV VTI:            0.070 m AV  Peak Grad:      1.4 mmHg AV Mean Grad:      1.0 mmHg LVOT Vmax:         52.20 cm/s LVOT Vmean:        35.500 cm/s LVOT VTI:          0.066 m LVOT/AV VTI ratio: 0.94  AORTA Ao Root diam: 3.50 cm Ao Asc diam:  3.20 cm MR Peak grad: 67.9 mmHg   TRICUSPID VALVE MR Mean grad: 48.0 mmHg   TR Peak grad:   38.9 mmHg MR Vmax:      412.00 cm/s TR Vmax:        312.00 cm/s MR Vmean:     333.0 cm/s                           SHUNTS                           Systemic VTI:  0.07 m  Systemic Diam: 2.20 cm Epifanio Lesches MD Electronically signed by Epifanio Lesches MD Signature Date/Time: 07/14/2023/12:48:51 PM    Final    Medications:   Scheduled Medications:  buprenorphine-naloxone  1 tablet Sublingual Daily   Chlorhexidine Gluconate Cloth  6 each Topical Daily   digoxin  0.125 mg Oral Daily   enoxaparin (LOVENOX) injection  40 mg Subcutaneous Q24H   ipratropium  1 spray Each Nare BID   loratadine  10 mg Oral QPM   mexiletine  200 mg Oral BID   mometasone-formoterol  2 puff Inhalation BID   montelukast  10 mg Oral q morning   sodium chloride flush  10-40 mL Intracatheter Q12H   sodium chloride flush  3 mL Intravenous Q12H   spironolactone  12.5 mg Oral Daily    Infusions:  sodium chloride      PRN Medications: sodium chloride, acetaminophen, acetaminophen, levalbuterol, nitroGLYCERIN, ondansetron (ZOFRAN) IV, sodium chloride flush, sodium chloride flush  Patient Profile   Joel York is a 37 y.o. male with HTN, ADD, asthma, hx drug abuse (now on suboxone x5 yrs), and seizures. Admitted with acute systolic heart failure.   Assessment/Plan  Acute biventricular systolic heart failure - Echo EF <20%,  RV mildly reduced, RV mod enlarged, estimated RV systolic pressure 53.14mmHg, LA mod dilated, RA mod dilated, mild-mod MR/TR - NYHA III on admission, suspect 2/2 PVCs / uncontrolled HTN / genetic?, myocarditis? (Seen in ED for recurrent sinusitis/viral illness in March)  -  Volume elevated on assessment.  CVP 20 this morning, increase lasix to 80 IV BID - Co-ox 30%? This morning. STAT repeat. - Start milrinone 0.125, follow co-ox. May need additional pressure support but levo will worsen HR. Follow milrinone response for now.  - Hold spiro today, SBP in 90s - Continue digoxin 0.25 mcg daily - SGLT2i when renal function improves, A1c 6 - check lactic acid - L/RHC scheduled for tomorrow, may need to push back to Thursday until better diuresed, will discuss with MD. cMRI after - HIV (-),LDL 30 - will need genetic testing OP - strict I&O, daily weights  Hypertension - soft this morning, can add GDMT as tolerated  Chest pressure - HsTrop 39>43>37>40 - denies CP on assessment - suspect HsTrop elevated with volume overload - LHC once better diuresed  PVCs - high burden noted on tele - snores, will need sleep study OP - Continue Mexiletine 200 mg BID, improved but may worsen with milrinone. Will monitor - denies stimulant use - TSH 4.9, T4 0.79 - Keep K>4 and Mg >2 - K 3.6, replete, Mg 2.1  AKI - SCr 1.02 in March, baseline - Up to 1.8 on admission - 1.9 today, should improved with inotropic support - follow with diuresis  Tobacco use - smokes 3-6 cigarettes / day - cessation encouraged  Hx drug abuse - Continue suboxone  Elevated LFTs - AST 80>57, ALT 74>68 - suspect low-output HF, will trend  9. Iron deficiency - Anemia panel 7/24 with tSat 20 and ferritin 49 - will need feraheme prior to discharge - only AFTER cMRI  Length of Stay: 1  Alen Bleacher, NP  07/15/2023, 7:46 AM  Advanced Heart Failure Team Pager [phone] (M-F; 7a - 5p)  Please contact [facility] for night-coverage after hours (5p -7a ) and weekends on [email or web address]   Agree with above.   Developed low output HF this am. Co-ox 30s. Milrinone started. Moving to ICU   Denies CP or SOB currently  General: Sitting up in  bed No resp difficulty HEENT: normal Neck:  supple. JVp to jaw. . Carotids 2+ bilat; no bruits. No lymphadenopathy or thryomegaly appreciated. Cor: PMI laterally displaced. Tachy regualr +s3 Lungs: clear Abdomen: soft, nontender, nondistended. No hepatosplenomegaly. No bruits or masses. Good bowel sounds. Extremities: no cyanosis, clubbing, rash, edema Neuro: alert & orientedx3, cranial nerves grossly intact. moves all 4 extremities w/o difficulty. Affect pleasant  Very tenuous with low output and marked tachycardia. Will continue milrinone. Move to ICU. Plan R/L cath tomorrow and cMRI.   I am concerned that he may need to be transferred to [facility] for transplant evaluation. I d/w him and his family.   CRITICAL CARE Performed by: [name]  Total critical care time: 45 minutes  Critical care time was exclusive of separately billable procedures and treating other patients.  Critical care was necessary to treat or prevent imminent or life-threatening deterioration.  Critical care was time spent personally by me (independent of midlevel providers or residents) on the following activities: development of treatment plan with patient and/or surrogate as well as nursing, discussions with consultants, evaluation of patient's response to treatment, examination of patient, obtaining history from patient or surrogate, ordering and performing treatments and interventions, ordering and review of laboratory studies, ordering and review of radiographic studies, pulse oximetry and re-evaluation of patient's condition.  [name], MD  2:30 PM

## 2023-07-15 NOTE — Progress Notes (Signed)
Patient required adjusted blue tube. Per lab instructions, all light blue tubes may require this adjustment before coagulation tubes are collected.

## 2023-07-15 NOTE — TOC Benefit Eligibility Note (Signed)
Pharmacy Patient Advocate Encounter  Insurance verification completed.    The patient is insured through [facility] MEDICAID   Ran test claim for mexiletine (Mexitil) 200 mg capsules and the current 30 day co-pay is $4.00.   This test claim was processed through [facility]- copay amounts may vary at other pharmacies due to pharmacy/plan contracts, or as the patient moves through the different stages of their insurance plan.    Roland Earl, CPHT Pharmacy Patient Advocate Specialist [facility] Patient Advocate Team Direct Number: [phone]  Fax: [phone]

## 2023-07-16 ENCOUNTER — Other Ambulatory Visit (HOSPITAL_COMMUNITY): Payer: Self-pay

## 2023-07-16 ENCOUNTER — Encounter (HOSPITAL_COMMUNITY)
Admission: EM | Disposition: A | Discharge status: Home / Self Care | Payer: Self-pay | Source: Home / Self Care | Attending: Internal Medicine

## 2023-07-16 DIAGNOSIS — D509 Iron deficiency anemia, unspecified: Secondary | ICD-10-CM | POA: Diagnosis not present

## 2023-07-16 DIAGNOSIS — R570 Cardiogenic shock: Secondary | ICD-10-CM | POA: Diagnosis not present

## 2023-07-16 DIAGNOSIS — I5082 Biventricular heart failure: Secondary | ICD-10-CM | POA: Diagnosis not present

## 2023-07-16 DIAGNOSIS — I110 Hypertensive heart disease with heart failure: Secondary | ICD-10-CM | POA: Diagnosis not present

## 2023-07-16 DIAGNOSIS — E876 Hypokalemia: Secondary | ICD-10-CM | POA: Diagnosis not present

## 2023-07-16 DIAGNOSIS — I5021 Acute systolic (congestive) heart failure: Secondary | ICD-10-CM | POA: Diagnosis not present

## 2023-07-16 HISTORY — PX: RIGHT/LEFT HEART CATH AND CORONARY ANGIOGRAPHY: CATH118266

## 2023-07-16 LAB — CBC WITH DIFFERENTIAL/PLATELET
Abs Immature Granulocytes: 0.02 10*3/uL (ref 0.00–0.07)
Basophils Absolute: 0.1 10*3/uL (ref 0.0–0.1)
Basophils Relative: 1 %
Eosinophils Absolute: 0.3 10*3/uL (ref 0.0–0.5)
Eosinophils Relative: 3 %
HCT: 55.8 % — ABNORMAL HIGH (ref 39.0–52.0)
Hemoglobin: 18.7 g/dL — ABNORMAL HIGH (ref 13.0–17.0)
Immature Granulocytes: 0 %
Lymphocytes Relative: 38 %
Lymphs Abs: 3.4 10*3/uL (ref 0.7–4.0)
MCH: 29 pg (ref 26.0–34.0)
MCHC: 33.5 g/dL (ref 30.0–36.0)
MCV: 86.6 fL (ref 80.0–100.0)
Monocytes Absolute: 0.9 10*3/uL (ref 0.1–1.0)
Monocytes Relative: 10 %
Neutro Abs: 4.4 10*3/uL (ref 1.7–7.7)
Neutrophils Relative %: 48 %
Platelets: 278 10*3/uL (ref 150–400)
RBC: 6.44 MIL/uL — ABNORMAL HIGH (ref 4.22–5.81)
RDW: 14 % (ref 11.5–15.5)
WBC: 9 10*3/uL (ref 4.0–10.5)
nRBC: 0 % (ref 0.0–0.2)

## 2023-07-16 LAB — POCT I-STAT EG7
Acid-Base Excess: 2 mmol/L (ref 0.0–2.0)
Acid-Base Excess: 4 mmol/L — ABNORMAL HIGH (ref 0.0–2.0)
Bicarbonate: 28.4 mmol/L — ABNORMAL HIGH (ref 20.0–28.0)
Bicarbonate: 31.1 mmol/L — ABNORMAL HIGH (ref 20.0–28.0)
Calcium, Ion: 0.89 mmol/L — CL (ref 1.15–1.40)
Calcium, Ion: 1.03 mmol/L — ABNORMAL LOW (ref 1.15–1.40)
HCT: 55 % — ABNORMAL HIGH (ref 39.0–52.0)
HCT: 58 % — ABNORMAL HIGH (ref 39.0–52.0)
Hemoglobin: 18.7 g/dL — ABNORMAL HIGH (ref 13.0–17.0)
Hemoglobin: 19.7 g/dL — ABNORMAL HIGH (ref 13.0–17.0)
O2 Saturation: 67 %
O2 Saturation: 68 %
Potassium: 3.1 mmol/L — ABNORMAL LOW (ref 3.5–5.1)
Potassium: 3.6 mmol/L (ref 3.5–5.1)
Sodium: 141 mmol/L (ref 135–145)
Sodium: 143 mmol/L (ref 135–145)
TCO2: 30 mmol/L (ref 22–32)
TCO2: 33 mmol/L — ABNORMAL HIGH (ref 22–32)
pCO2, Ven: 46.5 mmHg (ref 44–60)
pCO2, Ven: 50.3 mmHg (ref 44–60)
pH, Ven: 7.394 (ref 7.25–7.43)
pH, Ven: 7.399 (ref 7.25–7.43)
pO2, Ven: 35 mmHg (ref 32–45)
pO2, Ven: 36 mmHg (ref 32–45)

## 2023-07-16 LAB — POCT I-STAT 7, (LYTES, BLD GAS, ICA,H+H)
Acid-Base Excess: 1 mmol/L (ref 0.0–2.0)
Bicarbonate: 24.7 mmol/L (ref 20.0–28.0)
Calcium, Ion: 0.75 mmol/L — CL (ref 1.15–1.40)
HCT: 52 % (ref 39.0–52.0)
Hemoglobin: 17.7 g/dL — ABNORMAL HIGH (ref 13.0–17.0)
O2 Saturation: 95 %
Potassium: 2.9 mmol/L — ABNORMAL LOW (ref 3.5–5.1)
Sodium: 145 mmol/L (ref 135–145)
TCO2: 26 mmol/L (ref 22–32)
pCO2 arterial: 35.1 mmHg (ref 32–48)
pH, Arterial: 7.456 — ABNORMAL HIGH (ref 7.35–7.45)
pO2, Arterial: 70 mmHg — ABNORMAL LOW (ref 83–108)

## 2023-07-16 LAB — MAGNESIUM: Magnesium: 2 mg/dL (ref 1.7–2.4)

## 2023-07-16 LAB — COMPREHENSIVE METABOLIC PANEL
ALT: 58 U/L — ABNORMAL HIGH (ref 0–44)
AST: 46 U/L — ABNORMAL HIGH (ref 15–41)
Albumin: 3.3 g/dL — ABNORMAL LOW (ref 3.5–5.0)
Alkaline Phosphatase: 130 U/L — ABNORMAL HIGH (ref 38–126)
Anion gap: 12 (ref 5–15)
BUN: 16 mg/dL (ref 6–20)
CO2: 32 mmol/L (ref 22–32)
Calcium: 8.2 mg/dL — ABNORMAL LOW (ref 8.9–10.3)
Chloride: 92 mmol/L — ABNORMAL LOW (ref 98–111)
Creatinine, Ser: 1.65 mg/dL — ABNORMAL HIGH (ref 0.61–1.24)
GFR, Estimated: 55 mL/min — ABNORMAL LOW (ref >60)
Glucose, Bld: 159 mg/dL — ABNORMAL HIGH (ref 70–99)
Potassium: 3.2 mmol/L — ABNORMAL LOW (ref 3.5–5.1)
Sodium: 136 mmol/L (ref 135–145)
Total Bilirubin: 1.2 mg/dL (ref 0.3–1.2)
Total Protein: 6.3 g/dL — ABNORMAL LOW (ref 6.5–8.1)

## 2023-07-16 LAB — VITAMIN B1: Vitamin B1 (Thiamine): 188.3 nmol/L (ref 66.5–200.0)

## 2023-07-16 LAB — COOXEMETRY PANEL
Carboxyhemoglobin: 2 % — ABNORMAL HIGH (ref 0.5–1.5)
Methemoglobin: <0.7 % (ref 0.0–1.5)
O2 Saturation: 65.2 %
Total hemoglobin: 18.9 g/dL — ABNORMAL HIGH (ref 12.0–16.0)

## 2023-07-16 LAB — CALCIUM, IONIZED: Calcium, Ionized, Serum: 4.4 mg/dL — ABNORMAL LOW (ref 4.5–5.6)

## 2023-07-16 SURGERY — RIGHT/LEFT HEART CATH AND CORONARY ANGIOGRAPHY
Anesthesia: LOCAL

## 2023-07-16 MED ORDER — FENTANYL CITRATE (PF) 100 MCG/2ML IJ SOLN
INTRAMUSCULAR | Status: AC
  Filled 2023-07-16: qty 2

## 2023-07-16 MED ORDER — ONDANSETRON HCL 4 MG/2ML IJ SOLN
4.0000 mg | Freq: Four times a day (QID) | INTRAMUSCULAR | Status: DC | PRN
  Administered 2023-07-17 – 2023-07-26 (×2): 4 mg via INTRAVENOUS
  Filled 2023-07-16 (×2): qty 2

## 2023-07-16 MED ORDER — LIDOCAINE HCL (PF) 1 % IJ SOLN
INTRAMUSCULAR | Status: AC
  Filled 2023-07-16: qty 30

## 2023-07-16 MED ORDER — HEPARIN SODIUM (PORCINE) 1000 UNIT/ML IJ SOLN
INTRAMUSCULAR | Status: AC
  Filled 2023-07-16: qty 10

## 2023-07-16 MED ORDER — SODIUM CHLORIDE 0.9% FLUSH: 3.0000 mL | INTRAVENOUS | Status: DC | PRN

## 2023-07-16 MED ORDER — VERAPAMIL HCL 2.5 MG/ML IV SOLN
INTRAVENOUS | Status: DC | PRN
  Administered 2023-07-16: 10 mL via INTRA_ARTERIAL

## 2023-07-16 MED ORDER — LIDOCAINE HCL (PF) 1 % IJ SOLN
INTRAMUSCULAR | Status: DC | PRN
  Administered 2023-07-16: 2 mL

## 2023-07-16 MED ORDER — MIDAZOLAM HCL 2 MG/2ML IJ SOLN
INTRAMUSCULAR | Status: DC | PRN
  Administered 2023-07-16: 2 mg via INTRAVENOUS

## 2023-07-16 MED ORDER — POTASSIUM CHLORIDE CRYS ER 20 MEQ PO TBCR
40.0000 meq | EXTENDED_RELEASE_TABLET | ORAL | Status: AC
  Administered 2023-07-16 (×2): 40 meq via ORAL
  Filled 2023-07-16 (×2): qty 2

## 2023-07-16 MED ORDER — ACETAMINOPHEN 325 MG PO TABS
650.0000 mg | ORAL_TABLET | ORAL | Status: DC | PRN
  Administered 2023-07-25 – 2023-07-28 (×6): 650 mg via ORAL
  Filled 2023-07-16 (×2): qty 2

## 2023-07-16 MED ORDER — ENOXAPARIN SODIUM 40 MG/0.4ML IJ SOSY
40.0000 mg | PREFILLED_SYRINGE | INTRAMUSCULAR | Status: DC
  Administered 2023-07-18 – 2023-07-22 (×5): 40 mg via SUBCUTANEOUS
  Filled 2023-07-16 (×5): qty 0.4

## 2023-07-16 MED ORDER — FENTANYL CITRATE (PF) 100 MCG/2ML IJ SOLN
INTRAMUSCULAR | Status: DC | PRN
  Administered 2023-07-16: 25 ug via INTRAVENOUS

## 2023-07-16 MED ORDER — MIDAZOLAM HCL 2 MG/2ML IJ SOLN
INTRAMUSCULAR | Status: AC
  Filled 2023-07-16: qty 2

## 2023-07-16 MED ORDER — HEPARIN (PORCINE) IN NACL 1000-0.9 UT/500ML-% IV SOLN
INTRAVENOUS | Status: DC | PRN
  Administered 2023-07-16 (×2): 500 mL

## 2023-07-16 MED ORDER — FUROSEMIDE 10 MG/ML IJ SOLN
80.0000 mg | Freq: Once | INTRAMUSCULAR | Status: AC
  Administered 2023-07-16: 80 mg via INTRAVENOUS
  Filled 2023-07-16: qty 8

## 2023-07-16 MED ORDER — SODIUM CHLORIDE 0.9 % IV SOLN
250.0000 mL | INTRAVENOUS | Status: DC | PRN
  Administered 2023-07-25 – 2023-07-28 (×2): 250 mL via INTRAVENOUS

## 2023-07-16 MED ORDER — HEPARIN SODIUM (PORCINE) 1000 UNIT/ML IJ SOLN
INTRAMUSCULAR | Status: DC | PRN
  Administered 2023-07-16: 3500 [IU] via INTRAVENOUS

## 2023-07-16 MED ORDER — HYDRALAZINE HCL 20 MG/ML IJ SOLN: 10.0000 mg | INTRAMUSCULAR | Status: AC | PRN

## 2023-07-16 MED ORDER — SODIUM CHLORIDE 0.9 % IV SOLN
INTRAVENOUS | Status: DC | PRN
  Administered 2023-07-16: 10 mL/h via INTRAVENOUS

## 2023-07-16 MED ORDER — VERAPAMIL HCL 2.5 MG/ML IV SOLN
INTRAVENOUS | Status: AC
  Filled 2023-07-16: qty 2

## 2023-07-16 MED ORDER — SODIUM CHLORIDE 0.9% FLUSH
3.0000 mL | Freq: Two times a day (BID) | INTRAVENOUS | Status: DC
  Administered 2023-07-16 – 2023-07-29 (×23): 3 mL via INTRAVENOUS

## 2023-07-16 MED ORDER — LABETALOL HCL 5 MG/ML IV SOLN: 10.0000 mg | INTRAVENOUS | Status: AC | PRN

## 2023-07-16 MED ORDER — POTASSIUM CHLORIDE CRYS ER 20 MEQ PO TBCR
40.0000 meq | EXTENDED_RELEASE_TABLET | Freq: Once | ORAL | Status: AC
  Administered 2023-07-16: 40 meq via ORAL
  Filled 2023-07-16: qty 2

## 2023-07-16 SURGICAL SUPPLY — 10 items
CATH 5FR JL3.5 JR4 ANG PIG MP (CATHETERS) IMPLANT
CATH BALLN WEDGE 5F 110CM (CATHETERS) IMPLANT
DEVICE RAD COMP TR BAND LRG (VASCULAR PRODUCTS) IMPLANT
GLIDESHEATH SLEND SS 6F .021 (SHEATH) IMPLANT
GUIDEWIRE INQWIRE 1.5J.035X260 (WIRE) IMPLANT
INQWIRE 1.5J .035X260CM (WIRE) ×1
PACK CARDIAC CATHETERIZATION (CUSTOM PROCEDURE TRAY) ×1 IMPLANT
SET ATX-X65L (MISCELLANEOUS) IMPLANT
SHEATH GLIDE SLENDER 4/5FR (SHEATH) IMPLANT
TRANSDUCER W/STOPCOCK (MISCELLANEOUS) ×1 IMPLANT

## 2023-07-16 NOTE — Interval H&P Note (Signed)
History and Physical Interval Note:  07/16/2023 8:27 AM  Joel York  has presented today for surgery, with the diagnosis of heart failure.  The various methods of treatment have been discussed with the patient and family. After consideration of risks, benefits and other options for treatment, the patient has consented to  Procedure(s): RIGHT/LEFT HEART CATH AND CORONARY ANGIOGRAPHY (N/A) as a surgical intervention.  The patient's history has been reviewed, patient examined, no change in status, stable for surgery.  I have reviewed the patient's chart and labs.  Questions were answered to the patient's satisfaction.     [name]

## 2023-07-16 NOTE — H&P (View-Only) (Signed)
Advanced Heart Failure Rounding Note  PCP-Cardiologist: None   Subjective:    07/23: Initial CO-OX 30%. Started on milrinone 0.25 + NE. Moved to ICU.  CO-OX 65% on milrinone 0.25 + NE 2.  4.8L UOP yesterday with IV lasix 80 BID. CVP 10.   Feeling well. No dyspnea at rest.   Objective:   Weight Range: 63.5 kg Body mass index is 21.93 kg/m.   Vital Signs:   Temp:  [97.7 F (36.5 C)-98.8 F (37.1 C)] 98.5 F (36.9 C) (07/24 0400) Pulse Rate:  [68-121] 105 (07/24 0715) Resp:  [8-23] 13 (07/24 0715) BP: (91-124)/(56-94) 99/69 (07/24 0715) SpO2:  [90 %-100 %] 93 % (07/24 0715) Weight:  [63.5 kg-66.5 kg] 63.5 kg (07/24 0500) Last BM Date : 07/13/23  Weight change: Filed Weights   07/15/23 0407 07/15/23 1430 07/16/23 0500  Weight: 67.3 kg 66.5 kg 63.5 kg   Intake/Output:   Intake/Output Summary (Last 24 hours) at 07/16/2023 0738 Last data filed at 07/16/2023 0700 Gross per 24 hour  Intake 267.4 ml  Output 4800 ml  Net -4532.6 ml     Physical Exam  General:  Well appearing. HEENT: normal Neck: supple. JVP 8-10. Carotids 2+ bilat; no bruits.  Cor: PMI nondisplaced. Regular rate & rhythm, tachy. No rubs, gallops or murmurs. Lungs: clear Abdomen: soft, nontender, nondistended.  Extremities: no cyanosis, clubbing, rash, edema Neuro: alert & orientedx3. Affect pleasant   Telemetry   Sinus/sinus tach, 90s-100s  EKG    No new EKG to review  Labs    CBC Recent Labs    07/14/23 0511 07/14/23 0620 07/16/23 0400  WBC 10.1  --  9.0  NEUTROABS  --   --  4.4  HGB 18.4* 18.7* 18.7*  HCT 55.6* 55.0* 55.8*  MCV 88.7  --  86.6  PLT 261  --  278   Basic Metabolic Panel Recent Labs    16/10/96 0504 07/16/23 0400  NA 136 136  K 3.6 3.2*  CL 98 92*  CO2 29 32  GLUCOSE 148* 159*  BUN 17 16  CREATININE 1.93* 1.65*  CALCIUM 8.2* 8.2*  MG 2.1 2.0   Liver Function Tests Recent Labs    07/15/23 0504 07/16/23 0400  AST 57* 46*  ALT 68* 58*   ALKPHOS 132* 130*  BILITOT 1.4* 1.2  PROT 6.2* 6.3*  ALBUMIN 3.3* 3.3*   No results for input(s): "LIPASE", "AMYLASE" in the last 72 hours. Cardiac Enzymes No results for input(s): "CKTOTAL", "CKMB", "CKMBINDEX", "TROPONINI" in the last 72 hours.  BNP: BNP (last 3 results) Recent Labs    07/14/23 0101  BNP 1,810.8*  2,494.6*    ProBNP (last 3 results) No results for input(s): "PROBNP" in the last 8760 hours.   D-Dimer Recent Labs    07/15/23 0504  DDIMER 0.67*   Hemoglobin A1C Recent Labs    07/15/23 0504  HGBA1C 6.0*   Fasting Lipid Panel Recent Labs    07/15/23 0504  CHOL 74  HDL 16*  LDLCALC 30  TRIG 045  CHOLHDL 4.6   Thyroid Function Tests Recent Labs    07/14/23 0611  TSH 4.982*   Other results:  Imaging   No results found. Medications:   Scheduled Medications:  buprenorphine-naloxone  1 tablet Sublingual Daily   Chlorhexidine Gluconate Cloth  6 each Topical Daily   digoxin  0.125 mg Oral Daily   enoxaparin (LOVENOX) injection  40 mg Subcutaneous Q24H   ipratropium  1 spray Each Nare  BID   loratadine  10 mg Oral QPM   mexiletine  200 mg Oral BID   mometasone-formoterol  2 puff Inhalation BID   montelukast  10 mg Oral q morning   potassium chloride  40 mEq Oral Q4H   sodium chloride flush  10-40 mL Intracatheter Q12H   sodium chloride flush  3 mL Intravenous Q12H    Infusions:  sodium chloride     sodium chloride 10 mL/hr at 07/16/23 0700   milrinone 0.25 mcg/kg/min (07/16/23 0700)   norepinephrine (LEVOPHED) Adult infusion 2 mcg/min (07/16/23 0700)    PRN Medications: sodium chloride, acetaminophen, acetaminophen, alum & mag hydroxide-simeth, levalbuterol, nitroGLYCERIN, sodium chloride flush, sodium chloride flush  Patient Profile   Joel York is a 37 y.o. male with HTN, ADD, asthma, hx drug abuse (now on suboxone x5 yrs), and seizures. Admitted with acute systolic heart failure.   Assessment/Plan  Acute biventricular  systolic heart failure >> cardiogenic shock - Echo EF <20%,  RV mildly reduced, RV mod enlarged, estimated RV systolic pressure 53.19mmHg, LA mod dilated, RA mod dilated, mild-mod MR/TR - Etiology uncertain. ? 2/2 PVCs vs genetically mediated +/- hypertension. R/LHC planned for today followed by cMRI. Will need genetic testing. - NYHA III on admit. Improving.  - Initial CO-OX 30%. Lactic acid 2.5>>cleared. CO-OX now 65% on milrinone 0.25 + NE 2 - CVP 9-10. Will likely need additional diuresis but will wait until after cath. - Continue digoxin 0.125 - GDMT as tolerated - Improving with inotrope support but remains tachycardic. Depending on his course, may need to transfer to [facility] for transplant evaluation.  Hypertension - BP soft this admission  Chest pressure - HsTrop 39>43>37>40, suspect d/t demand ischemia - Chest discomfort likely result of volume overload - LHC once better diuresed  PVCs - high burden noted on tele - snores, will need eventual sleep study  - Suppressed with Mexiletine 200 mg BID - TSH 4.9, T4 0.79 - Keep K>4 and Mg >2  AKI - SCr 1.02 in March, baseline - Scr peaked at 1.9, improved to 1.65. Continue to support CO.  Tobacco use - smokes 3-6 cigarettes / day - cessation encouraged  Hx drug abuse - Continue suboxone  Elevated LFTs - In setting of low-output HF, improving  9. Iron deficiency - TSat 20 and ferritin 49, hgb 18.7 - could consider feraheme prior to discharge - only AFTER cMRI  10. Hypokalemia - K 3.2  - Supp aggressivley  Length of Stay: 2  [name], [name], PA-C  07/16/2023, 7:38 AM  Advanced Heart Failure Team Pager [phone] (M-F; 7a - 5p)  Please contact [facility] for night-coverage after hours (5p -7a ) and weekends on [email or web address]    Agree with above.  Now on milrinone 0.25 and NE 2. Co-ox 65% Diuresed well. CVP 10. Denies CP or SOB. Remains tachy   General:  Sitting up in bed  No resp difficulty HEENT: normal Neck:  supple. no JVD. Carotids 2+ bilat; no bruits. No lymphadenopathy or thryomegaly appreciated. Cor: Reg tachy +s3 Lungs: clear Abdomen: soft, nontender, nondistended. No hepatosplenomegaly. No bruits or masses. Good bowel sounds. Extremities: no cyanosis, clubbing, rash, edema Neuro: alert & orientedx3, cranial nerves grossly intact. moves all 4 extremities w/o difficulty. Affect pleasant  Hemodynamically improved on milrinone and NE. Plan R/L cath today followed by cMRI.   I suspect may need transplant w/u sooner rather than later but tobacco use an issue.   Supp K   CRITICAL CARE Performed by:  Sadie Hazelett  Total critical care time: 35 minutes  Critical care time was exclusive of separately billable procedures and treating other patients.  Critical care was necessary to treat or prevent imminent or life-threatening deterioration.  Critical care was time spent personally by me (independent of midlevel providers or residents) on the following activities: development of treatment plan with patient and/or surrogate as well as nursing, discussions with consultants, evaluation of patient's response to treatment, examination of patient, obtaining history from patient or surrogate, ordering and performing treatments and interventions, ordering and review of laboratory studies, ordering and review of radiographic studies, pulse oximetry and re-evaluation of patient's condition.  [name], MD  2:26 PM

## 2023-07-16 NOTE — Progress Notes (Addendum)
Advanced Heart Failure Rounding Note  PCP-Cardiologist: None   Subjective:    07/23: Initial CO-OX 30%. Started on milrinone 0.25 + NE. Moved to ICU.  CO-OX 65% on milrinone 0.25 + NE 2.  4.8L UOP yesterday with IV lasix 80 BID. CVP 10.   Feeling well. No dyspnea at rest.   Objective:   Weight Range: 63.5 kg Body mass index is 21.93 kg/m.   Vital Signs:   Temp:  [97.7 F (36.5 C)-98.8 F (37.1 C)] 98.5 F (36.9 C) (07/24 0400) Pulse Rate:  [68-121] 105 (07/24 0715) Resp:  [8-23] 13 (07/24 0715) BP: (91-124)/(56-94) 99/69 (07/24 0715) SpO2:  [90 %-100 %] 93 % (07/24 0715) Weight:  [63.5 kg-66.5 kg] 63.5 kg (07/24 0500) Last BM Date : 07/13/23  Weight change: Filed Weights   07/15/23 0407 07/15/23 1430 07/16/23 0500  Weight: 67.3 kg 66.5 kg 63.5 kg   Intake/Output:   Intake/Output Summary (Last 24 hours) at 07/16/2023 0738 Last data filed at 07/16/2023 0700 Gross per 24 hour  Intake 267.4 ml  Output 4800 ml  Net -4532.6 ml     Physical Exam  General:  Well appearing. HEENT: normal Neck: supple. JVP 8-10. Carotids 2+ bilat; no bruits.  Cor: PMI nondisplaced. Regular rate & rhythm, tachy. No rubs, gallops or murmurs. Lungs: clear Abdomen: soft, nontender, nondistended.  Extremities: no cyanosis, clubbing, rash, edema Neuro: alert & orientedx3. Affect pleasant   Telemetry   Sinus/sinus tach, 90s-100s  EKG    No new EKG to review  Labs    CBC Recent Labs    07/14/23 0511 07/14/23 0620 07/16/23 0400  WBC 10.1  --  9.0  NEUTROABS  --   --  4.4  HGB 18.4* 18.7* 18.7*  HCT 55.6* 55.0* 55.8*  MCV 88.7  --  86.6  PLT 261  --  278   Basic Metabolic Panel Recent Labs    16/10/96 0504 07/16/23 0400  NA 136 136  K 3.6 3.2*  CL 98 92*  CO2 29 32  GLUCOSE 148* 159*  BUN 17 16  CREATININE 1.93* 1.65*  CALCIUM 8.2* 8.2*  MG 2.1 2.0   Liver Function Tests Recent Labs    07/15/23 0504 07/16/23 0400  AST 57* 46*  ALT 68* 58*   ALKPHOS 132* 130*  BILITOT 1.4* 1.2  PROT 6.2* 6.3*  ALBUMIN 3.3* 3.3*   No results for input(s): "LIPASE", "AMYLASE" in the last 72 hours. Cardiac Enzymes No results for input(s): "CKTOTAL", "CKMB", "CKMBINDEX", "TROPONINI" in the last 72 hours.  BNP: BNP (last 3 results) Recent Labs    07/14/23 0101  BNP 1,810.8*  2,494.6*    ProBNP (last 3 results) No results for input(s): "PROBNP" in the last 8760 hours.   D-Dimer Recent Labs    07/15/23 0504  DDIMER 0.67*   Hemoglobin A1C Recent Labs    07/15/23 0504  HGBA1C 6.0*   Fasting Lipid Panel Recent Labs    07/15/23 0504  CHOL 74  HDL 16*  LDLCALC 30  TRIG 045  CHOLHDL 4.6   Thyroid Function Tests Recent Labs    07/14/23 0611  TSH 4.982*   Other results:  Imaging   No results found. Medications:   Scheduled Medications:  buprenorphine-naloxone  1 tablet Sublingual Daily   Chlorhexidine Gluconate Cloth  6 each Topical Daily   digoxin  0.125 mg Oral Daily   enoxaparin (LOVENOX) injection  40 mg Subcutaneous Q24H   ipratropium  1 spray Each Nare  BID   loratadine  10 mg Oral QPM   mexiletine  200 mg Oral BID   mometasone-formoterol  2 puff Inhalation BID   montelukast  10 mg Oral q morning   potassium chloride  40 mEq Oral Q4H   sodium chloride flush  10-40 mL Intracatheter Q12H   sodium chloride flush  3 mL Intravenous Q12H    Infusions:  sodium chloride     sodium chloride 10 mL/hr at 07/16/23 0700   milrinone 0.25 mcg/kg/min (07/16/23 0700)   norepinephrine (LEVOPHED) Adult infusion 2 mcg/min (07/16/23 0700)    PRN Medications: sodium chloride, acetaminophen, acetaminophen, alum & mag hydroxide-simeth, levalbuterol, nitroGLYCERIN, sodium chloride flush, sodium chloride flush  Patient Profile   Joel York is a 37 y.o. male with HTN, ADD, asthma, hx drug abuse (now on suboxone x5 yrs), and seizures. Admitted with acute systolic heart failure.   Assessment/Plan  Acute biventricular  systolic heart failure >> cardiogenic shock - Echo EF <20%,  RV mildly reduced, RV mod enlarged, estimated RV systolic pressure 53.19mmHg, LA mod dilated, RA mod dilated, mild-mod MR/TR - Etiology uncertain. ? 2/2 PVCs vs genetically mediated +/- hypertension. R/LHC planned for today followed by cMRI. Will need genetic testing. - NYHA III on admit. Improving.  - Initial CO-OX 30%. Lactic acid 2.5>>cleared. CO-OX now 65% on milrinone 0.25 + NE 2 - CVP 9-10. Will likely need additional diuresis but will wait until after cath. - Continue digoxin 0.125 - GDMT as tolerated - Improving with inotrope support but remains tachycardic. Depending on his course, may need to transfer to [facility] for transplant evaluation.  Hypertension - BP soft this admission  Chest pressure - HsTrop 39>43>37>40, suspect d/t demand ischemia - Chest discomfort likely result of volume overload - LHC once better diuresed  PVCs - high burden noted on tele - snores, will need eventual sleep study  - Suppressed with Mexiletine 200 mg BID - TSH 4.9, T4 0.79 - Keep K>4 and Mg >2  AKI - SCr 1.02 in March, baseline - Scr peaked at 1.9, improved to 1.65. Continue to support CO.  Tobacco use - smokes 3-6 cigarettes / day - cessation encouraged  Hx drug abuse - Continue suboxone  Elevated LFTs - In setting of low-output HF, improving  9. Iron deficiency - TSat 20 and ferritin 49, hgb 18.7 - could consider feraheme prior to discharge - only AFTER cMRI  10. Hypokalemia - K 3.2  - Supp aggressivley  Length of Stay: 2  [name], [name], PA-C  07/16/2023, 7:38 AM  Advanced Heart Failure Team Pager [phone] (M-F; 7a - 5p)  Please contact [facility] for night-coverage after hours (5p -7a ) and weekends on [email or web address]    Agree with above.  Now on milrinone 0.25 and NE 2. Co-ox 65% Diuresed well. CVP 10. Denies CP or SOB. Remains tachy   General:  Sitting up in bed  No resp difficulty HEENT: normal Neck:  supple. no JVD. Carotids 2+ bilat; no bruits. No lymphadenopathy or thryomegaly appreciated. Cor: Reg tachy +s3 Lungs: clear Abdomen: soft, nontender, nondistended. No hepatosplenomegaly. No bruits or masses. Good bowel sounds. Extremities: no cyanosis, clubbing, rash, edema Neuro: alert & orientedx3, cranial nerves grossly intact. moves all 4 extremities w/o difficulty. Affect pleasant  Hemodynamically improved on milrinone and NE. Plan R/L cath today followed by cMRI.   I suspect may need transplant w/u sooner rather than later but tobacco use an issue.   Supp K   CRITICAL CARE Performed by:  Larone Kliethermes  Total critical care time: 35 minutes  Critical care time was exclusive of separately billable procedures and treating other patients.  Critical care was necessary to treat or prevent imminent or life-threatening deterioration.  Critical care was time spent personally by me (independent of midlevel providers or residents) on the following activities: development of treatment plan with patient and/or surrogate as well as nursing, discussions with consultants, evaluation of patient's response to treatment, examination of patient, obtaining history from patient or surrogate, ordering and performing treatments and interventions, ordering and review of laboratory studies, ordering and review of radiographic studies, pulse oximetry and re-evaluation of patient's condition.  [name], MD  2:26 PM

## 2023-07-16 NOTE — Interval H&P Note (Signed)
History and Physical Interval Note:  07/16/2023 2:31 PM  Joel York  has presented today for surgery, with the diagnosis of heart failure.  The various methods of treatment have been discussed with the patient and family. After consideration of risks, benefits and other options for treatment, the patient has consented to  Procedure(s): RIGHT/LEFT HEART CATH AND CORONARY ANGIOGRAPHY (N/A) as a surgical intervention.  The patient's history has been reviewed, patient examined, no change in status, stable for surgery.  I have reviewed the patient's chart and labs.  Questions were answered to the patient's satisfaction.     Haydn Hutsell

## 2023-07-16 NOTE — TOC Benefit Eligibility Note (Signed)
Pharmacy Patient Advocate Encounter  Insurance verification completed.    The patient is insured through [employer]   Ran test claim for mexiletine (Mexitil) 200 mg capsules and the current 30 day co-pay is $4.00.   This test claim was processed through [facility]- copay amounts may vary at other pharmacies due to pharmacy/plan contracts, or as the patient moves through the different stages of their insurance plan.    Joel York, CPHT Pharmacy Patient Advocate Specialist [facility] Patient Advocate Team Direct Number: [phone]  Fax: [phone]

## 2023-07-17 ENCOUNTER — Encounter (HOSPITAL_COMMUNITY): Payer: Self-pay | Admitting: Internal Medicine

## 2023-07-17 ENCOUNTER — Inpatient Hospital Stay (HOSPITAL_COMMUNITY): Payer: Medicaid Other

## 2023-07-17 DIAGNOSIS — I340 Nonrheumatic mitral (valve) insufficiency: Secondary | ICD-10-CM | POA: Diagnosis not present

## 2023-07-17 DIAGNOSIS — Z1152 Encounter for screening for COVID-19: Secondary | ICD-10-CM | POA: Diagnosis not present

## 2023-07-17 DIAGNOSIS — D509 Iron deficiency anemia, unspecified: Secondary | ICD-10-CM | POA: Diagnosis not present

## 2023-07-17 DIAGNOSIS — I110 Hypertensive heart disease with heart failure: Secondary | ICD-10-CM | POA: Diagnosis not present

## 2023-07-17 DIAGNOSIS — J45909 Unspecified asthma, uncomplicated: Secondary | ICD-10-CM | POA: Diagnosis not present

## 2023-07-17 DIAGNOSIS — J918 Pleural effusion in other conditions classified elsewhere: Secondary | ICD-10-CM | POA: Diagnosis not present

## 2023-07-17 DIAGNOSIS — R570 Cardiogenic shock: Secondary | ICD-10-CM | POA: Diagnosis not present

## 2023-07-17 DIAGNOSIS — Z515 Encounter for palliative care: Secondary | ICD-10-CM | POA: Diagnosis not present

## 2023-07-17 DIAGNOSIS — E872 Acidosis, unspecified: Secondary | ICD-10-CM | POA: Diagnosis not present

## 2023-07-17 DIAGNOSIS — R569 Unspecified convulsions: Secondary | ICD-10-CM | POA: Diagnosis not present

## 2023-07-17 DIAGNOSIS — I2489 Other forms of acute ischemic heart disease: Secondary | ICD-10-CM | POA: Diagnosis not present

## 2023-07-17 DIAGNOSIS — I272 Pulmonary hypertension, unspecified: Secondary | ICD-10-CM | POA: Diagnosis not present

## 2023-07-17 DIAGNOSIS — I5082 Biventricular heart failure: Secondary | ICD-10-CM | POA: Diagnosis not present

## 2023-07-17 DIAGNOSIS — I5021 Acute systolic (congestive) heart failure: Secondary | ICD-10-CM | POA: Diagnosis not present

## 2023-07-17 DIAGNOSIS — I5023 Acute on chronic systolic (congestive) heart failure: Secondary | ICD-10-CM | POA: Diagnosis not present

## 2023-07-17 DIAGNOSIS — E876 Hypokalemia: Secondary | ICD-10-CM | POA: Diagnosis not present

## 2023-07-17 LAB — LACTATE DEHYDROGENASE: LDH: 239 U/L — ABNORMAL HIGH (ref 98–192)

## 2023-07-17 LAB — COMPREHENSIVE METABOLIC PANEL
ALT: 54 U/L — ABNORMAL HIGH (ref 0–44)
Albumin: 3.4 g/dL — ABNORMAL LOW (ref 3.5–5.0)
Anion gap: 13 (ref 5–15)
BUN: 14 mg/dL (ref 6–20)
CO2: 30 mmol/L (ref 22–32)
Chloride: 93 mmol/L — ABNORMAL LOW (ref 98–111)
Creatinine, Ser: 1.6 mg/dL — ABNORMAL HIGH (ref 0.61–1.24)
GFR, Estimated: 57 mL/min — ABNORMAL LOW (ref >60)
Glucose, Bld: 157 mg/dL — ABNORMAL HIGH (ref 70–99)
Potassium: 3.5 mmol/L (ref 3.5–5.1)
Sodium: 136 mmol/L (ref 135–145)
Total Protein: 6.3 g/dL — ABNORMAL LOW (ref 6.5–8.1)

## 2023-07-17 LAB — ANTITHROMBIN III: AntiThromb III Func: 109 % (ref 75–120)

## 2023-07-17 LAB — URINALYSIS, ROUTINE W REFLEX MICROSCOPIC
Bilirubin Urine: NEGATIVE
Glucose, UA: NEGATIVE mg/dL
Hgb urine dipstick: NEGATIVE
Ketones, ur: NEGATIVE mg/dL
Leukocytes,Ua: NEGATIVE
Nitrite: NEGATIVE
Protein, ur: NEGATIVE mg/dL
Specific Gravity, Urine: 1.016 (ref 1.005–1.030)
pH: 5 (ref 5.0–8.0)

## 2023-07-17 LAB — PREALBUMIN: Prealbumin: 23 mg/dL (ref 18–38)

## 2023-07-17 LAB — COOXEMETRY PANEL
Carboxyhemoglobin: 2.2 % — ABNORMAL HIGH (ref 0.5–1.5)
Carboxyhemoglobin: 1.4 % (ref 0.5–1.5)
Carboxyhemoglobin: 1.4 % (ref 0.5–1.5)
Carboxyhemoglobin: 1.9 % — ABNORMAL HIGH (ref 0.5–1.5)
Methemoglobin: <0.7 % (ref 0.0–1.5)
Methemoglobin: <0.7 % (ref 0.0–1.5)
Methemoglobin: 0.8 % (ref 0.0–1.5)
Methemoglobin: <0.7 % (ref 0.0–1.5)
O2 Saturation: 64.9 %
O2 Saturation: 45.2 %
O2 Saturation: 52.3 %
O2 Saturation: 57 %
Total hemoglobin: 19 g/dL — ABNORMAL HIGH (ref 12.0–16.0)
Total hemoglobin: 19.1 g/dL — ABNORMAL HIGH (ref 12.0–16.0)
Total hemoglobin: 18.9 g/dL — ABNORMAL HIGH (ref 12.0–16.0)
Total hemoglobin: 18.6 g/dL — ABNORMAL HIGH (ref 12.0–16.0)

## 2023-07-17 LAB — APTT: aPTT: 32 seconds (ref 24–36)

## 2023-07-17 LAB — CBC WITH DIFFERENTIAL/PLATELET
Abs Immature Granulocytes: 0.02 10*3/uL (ref 0.00–0.07)
Basophils Absolute: 0.1 10*3/uL (ref 0.0–0.1)
Basophils Relative: 1 %
Eosinophils Absolute: 0.2 10*3/uL (ref 0.0–0.5)
Eosinophils Relative: 2 %
HCT: 56.1 % — ABNORMAL HIGH (ref 39.0–52.0)
Hemoglobin: 18.7 g/dL — ABNORMAL HIGH (ref 13.0–17.0)
Lymphocytes Relative: 26 %
MCHC: 33.3 g/dL (ref 30.0–36.0)
MCV: 87 fL (ref 80.0–100.0)
Monocytes Absolute: 0.9 10*3/uL (ref 0.1–1.0)
Neutro Abs: 5.8 10*3/uL (ref 1.7–7.7)
Neutrophils Relative %: 62 %
Platelets: 291 10*3/uL (ref 150–400)
RBC: 6.45 MIL/uL — ABNORMAL HIGH (ref 4.22–5.81)
RDW: 14.2 % (ref 11.5–15.5)
WBC: 9.4 10*3/uL (ref 4.0–10.5)
nRBC: 0 % (ref 0.0–0.2)

## 2023-07-17 LAB — PSA: Prostatic Specific Antigen: 1.03 ng/mL (ref 0.00–4.00)

## 2023-07-17 LAB — RAPID URINE DRUG SCREEN, HOSP PERFORMED
Amphetamines: POSITIVE — AB
Barbiturates: NOT DETECTED
Benzodiazepines: POSITIVE — AB
Cocaine: NOT DETECTED
Opiates: NOT DETECTED
Tetrahydrocannabinol: NOT DETECTED

## 2023-07-17 LAB — TYPE AND SCREEN
ABO/RH(D): AB POS
Antibody Screen: NEGATIVE

## 2023-07-17 LAB — HEPATITIS B SURFACE ANTIGEN: Hepatitis B Surface Ag: NONREACTIVE

## 2023-07-17 LAB — HEPATITIS B CORE ANTIBODY, TOTAL: Hep B Core Total Ab: NONREACTIVE

## 2023-07-17 LAB — ABO/RH: ABO/RH(D): AB POS

## 2023-07-17 LAB — HEPATITIS B SURFACE ANTIBODY,QUALITATIVE: Hep B S Ab: REACTIVE — AB

## 2023-07-17 LAB — PROTIME-INR
INR: 1.1 (ref 0.8–1.2)
Prothrombin Time: 14.6 seconds (ref 11.4–15.2)

## 2023-07-17 LAB — CG4 I-STAT (LACTIC ACID): Lactic Acid, Venous: 2 mmol/L (ref 0.5–1.9)

## 2023-07-17 LAB — MAGNESIUM: Magnesium: 1.8 mg/dL (ref 1.7–2.4)

## 2023-07-17 LAB — URIC ACID: Uric Acid, Serum: 9.9 mg/dL — ABNORMAL HIGH (ref 3.7–8.6)

## 2023-07-17 LAB — HEPATITIS C ANTIBODY: HCV Ab: NONREACTIVE

## 2023-07-17 MED ORDER — ORAL CARE MOUTH RINSE: 15.0000 mL | OROMUCOSAL | Status: DC | PRN

## 2023-07-17 MED ORDER — SPIRONOLACTONE 12.5 MG HALF TABLET
12.5000 mg | ORAL_TABLET | Freq: Every day | ORAL | Status: DC
  Administered 2023-07-17 – 2023-07-19 (×3): 12.5 mg via ORAL
  Filled 2023-07-17 (×3): qty 1

## 2023-07-17 MED ORDER — MAGNESIUM SULFATE 4 GM/100ML IV SOLN
4.0000 g | Freq: Once | INTRAVENOUS | Status: AC
  Administered 2023-07-17: 4 g via INTRAVENOUS
  Filled 2023-07-17: qty 100

## 2023-07-17 MED ORDER — GADOBUTROL 1 MMOL/ML IV SOLN
10.0000 mL | Freq: Once | INTRAVENOUS | Status: AC | PRN
  Administered 2023-07-17: 10 mL via INTRAVENOUS

## 2023-07-17 MED ORDER — POTASSIUM CHLORIDE CRYS ER 20 MEQ PO TBCR
40.0000 meq | EXTENDED_RELEASE_TABLET | Freq: Two times a day (BID) | ORAL | Status: DC
  Administered 2023-07-17 – 2023-07-19 (×5): 40 meq via ORAL
  Filled 2023-07-17 (×5): qty 2

## 2023-07-17 NOTE — TOC Progression Note (Signed)
Transition of Care [facility]) - Progression Note    Patient Details  Name: Joel York MRN: [id number] Date of Birth: Mar 30, 1986  [facility] CM/SW Contact  Reva Bores, LCSWA Phone Number: 07/17/2023, 12:50 PM  Clinical Narrative:  CSW met with pt and wife, Joel York at bedside. CSW checked in to see if the family had any questions or needed anything at this time. Pt stated that he was starting to feel much better, better than the last time we met in the ED. Pt and family stated that they did not have any questions at this time. TOC will continue following.      Expected Discharge Plan: Home/Self Care Barriers to Discharge: Continued Medical Work up  Expected Discharge Plan and Services       Living arrangements for the past 2 months: Single Family Home                                       Social Determinants of Health (SDOH) Interventions SDOH Screenings   Food Insecurity: No Food Insecurity (07/14/2023)  Housing: Low Risk  (07/14/2023)  Transportation Needs: No Transportation Needs (07/14/2023)  Utilities: Not At Risk (07/14/2023)  Alcohol Screen: Low Risk  (07/14/2023)  Financial Resource Strain: High Risk (07/14/2023)  Physical Activity: Sufficiently Active (07/14/2023)  Stress: Stress Concern Present (07/14/2023)  Tobacco Use: High Risk (07/14/2023)    Readmission Risk Interventions     No data to display

## 2023-07-17 NOTE — Progress Notes (Signed)
VAD EDUCATION NOTE:                VAD evaluation consent reviewed and signed by Joel York and designated caregiver his wife Joel York.  Initial VAD teaching completed with pt and caregiver.   VAD educational packet including "Understanding Your Options with Advanced Heart Failure", "[facility] Patient Agreement for VAD Evaluation and Potential Implantation" consent, and [employer] "Heartmate 3 Left Ventricular Device (LVAD) Patient Guide", Heartmate 3 Left Ventricular Assist System Patient Education Program DVD", "[place] HM III Patient Education", " Mechanical Circulatory Support Program", and "Decision Aids for Left Ventricular Assist Device" reviewed in detail and left at bedside for continued reference.   All questions answered regarding VAD implant, hospital stay, and what to expect when discharged home living with a heart pump. Pt identified his wife Joel York as his primary caregiver.  Explained need for 24/7 care when pt is discharged home due to sternal precautions, adaptation to living on support, emotional support, consistent and meticulous exit site care and management, medication adherence and high volume of follow up visits with the VAD Clinic after discharge; both pt and caregiver verbalized understanding of above.   Explained that LVAD can be implanted for two indications in the setting of advanced left ventricular heart failure treatment:  Bridge to transplant - used for patients who cannot safely wait for heart transplant without this device.  Or    Destination therapy - used for patients until end of life or recovery of heart function.  Patient and caregiver(s) acknowledge that the indication at this point in time for LVAD therapy would be for DT due to recent smoking.   Provided brief equipment overview and demonstration with HeartMate III training loop including discussion on the following:   a) mobile power unit b) system controller   c) universal Magazine features editor    d) battery clips   e) Batteries   f)  Perc lock   g) Percutaneous lead   Demonstrated and discussed:  a) changing power source on system controller from tethered (MPU) to untethered (battery) mode   b) changing power source on system controller from untethered (battery) to tethered (MPU) mode   c) how to monitor battery life both on the system controller and on each individual battery   d) changing batteries   Reviewed and supplied a copy of home inspection check list stressing that only three pronged grounded power outlets can be used for VAD equipment. Joel Nouri confirmed home has electrical outlets that will support the equipment along with access working telephone.  Identified the following lifestyle modifications while living on MCS:    1. No driving for at least three months and then only if doctor gives permission to do so.   2. No tub baths while pump implanted, and shower only when doctor gives permission.   3. No swimming or submersion in water while implanted with pump.   4. No contact sports or engaging in jumping activities.   5. Always have a backup controller, charged spare batteries, and battery clips nearby at all times in case of emergency.   6. Call the doctor or hospital contact person if any change in how the pump sounds, feels, or works.   7. Plan to sleep only when connected to the power module.   8. Do not sleep on your stomach.   9. Keep a backup system controller, charged batteries, battery clips, and flashlight near you during sleep in case of electrical power outage.  10. Exit site care including dressing changes, monitoring for infection, and importance of keeping percutaneous lead stabilized at all times.     Extended the option to have one of our current patients and caregiver(s) come to talk with them about living on support} to assist with decision making. One of our patients will visit him this evening.  Reviewed pictures of VAD drive line, site care,  dressing changes, and drive line stabilization including securement attachment device and abdominal binder. Discussed with pt and family that they will be required to purchase dressing supplies as long as patient has the VAD in place.   He will also need to abide by sternal precautions with no lifting >10lbs, pushing, pulling and will need assistance with adapting to new life style with VAD equipment and care.   Intermacs patient survival statistics through March 2024 reviewed with patient and caregiver as follows:    The patient understands that from this discussion it does not mean that they will receive the device, but that depends on an extensive evaluation process. The patient is aware of the fact that if at anytime they want to stop the evaluation process they can.  All questions have been answered at this time and contact information was provided should they encounter any further questions.  They are both agreeable at this time to the evaluation process and will move forward.    Carlton Adam, RN VAD Coordinator   Office: [phone] 24/7 VAD Pager: [phone]

## 2023-07-17 NOTE — Progress Notes (Addendum)
More tachycardic after returning from cardiac MRI. Rhythm is sinus tach 130s-140s on tele.  CO-OX 45% on milrinone 0.25 + 2 NE.   Recheck CO-OX stat to confirm.  ADDENDUM 2:21 pm Repeat CO-OX 52% Increase milrinone to 0.375. Recheck CO-OX in 2 hrs.

## 2023-07-17 NOTE — Progress Notes (Addendum)
Advanced Heart Failure Rounding Note  PCP-Cardiologist: None   Subjective:    07/23: Initial CO-OX 30%. Started on milrinone 0.25 + NE.   CO-OX 65% on milrinone 0.25 + NE 2  CVP 4. -3.4L yesterday but ? Weight up 4 lb.   Feels great today. Appetite improving. No dyspnea.    R/LHC 07/24: Findings:   On milrinone 0.25 and NE 2    Ao = 97/70 (80) LV = 112/31 RA = 7 RV = 43/12 PA = 42/21 (30) PCW = 21 (v = 25) Fick cardiac output/index = 3.3/1.9 PVR = 2.7 WU SVR = 1778 FA sat = 95% PA sat = 67%, 67% PAPi = 3.0   Assessment: 1. Severe NICM EF < 10% 2. Normal coronaries 3. Elevated filling pressures and low cardiac output despite milrinone/NE support    Objective:   Weight Range: 65.3 kg Body mass index is 22.55 kg/m.   Vital Signs:   Temp:  [97.8 F (36.6 C)-98.4 F (36.9 C)] 97.8 F (36.6 C) (07/25 0300) Pulse Rate:  [98-137] 124 (07/25 0700) Resp:  [0-30] 10 (07/25 0700) BP: (87-169)/(43-143) 116/88 (07/25 0700) SpO2:  [90 %-98 %] 97 % (07/25 0700) Weight:  [65.3 kg] 65.3 kg (07/25 0500) Last BM Date : 07/13/23  Weight change: Filed Weights   07/15/23 1430 07/16/23 0500 07/17/23 0500  Weight: 66.5 kg 63.5 kg 65.3 kg   Intake/Output:   Intake/Output Summary (Last 24 hours) at 07/17/2023 0750 Last data filed at 07/17/2023 0400 Gross per 24 hour  Intake 511.11 ml  Output 3925 ml  Net -3413.89 ml     Physical Exam  General:  Well appearing. No resp difficulty HEENT: normal Neck: supple. no JVD. Carotids 2+ bilat; no bruits.  Cor: PMI nondisplaced. Regular rate & rhythm. No rubs, gallops or murmurs. Lungs: clear Abdomen: soft, nontender, nondistended.  Extremities: no cyanosis, clubbing, rash, edema, + RUE PICC Neuro: alert & orientedx3. Affect pleasant    Telemetry   Sinus tach 110s-120s  EKG    No new EKG to review  Labs    CBC Recent Labs    07/16/23 0400 07/16/23 1440 07/16/23 1447 07/17/23 0426  WBC 9.0  --   --   9.4  NEUTROABS 4.4  --   --  5.8  HGB 18.7*   < > 19.7*  18.7* 18.7*  HCT 55.8*   < > 58.0*  55.0* 56.1*  MCV 86.6  --   --  87.0  PLT 278  --   --  291   < > = values in this interval not displayed.   Basic Metabolic Panel Recent Labs    63/87/56 0400 07/16/23 1440 07/16/23 1447 07/17/23 0426  NA 136   < > 141  143 136  K 3.2*   < > 3.6  3.1* 3.5  CL 92*  --   --  93*  CO2 32  --   --  30  GLUCOSE 159*  --   --  157*  BUN 16  --   --  14  CREATININE 1.65*  --   --  1.60*  CALCIUM 8.2*  --   --  8.4*  MG 2.0  --   --  1.8   < > = values in this interval not displayed.   Liver Function Tests Recent Labs    07/16/23 0400 07/17/23 0426  AST 46* 40  ALT 58* 54*  ALKPHOS 130* 121  BILITOT 1.2 1.6*  PROT 6.3* 6.3*  ALBUMIN 3.3* 3.4*   No results for input(s): "LIPASE", "AMYLASE" in the last 72 hours. Cardiac Enzymes No results for input(s): "CKTOTAL", "CKMB", "CKMBINDEX", "TROPONINI" in the last 72 hours.  BNP: BNP (last 3 results) Recent Labs    07/14/23 0101  BNP 1,810.8*  2,494.6*    ProBNP (last 3 results) No results for input(s): "PROBNP" in the last 8760 hours.   D-Dimer Recent Labs    07/15/23 0504  DDIMER 0.67*   Hemoglobin A1C Recent Labs    07/15/23 0504  HGBA1C 6.0*   Fasting Lipid Panel Recent Labs    07/15/23 0504  CHOL 74  HDL 16*  LDLCALC 30  TRIG 811  CHOLHDL 4.6   Thyroid Function Tests No results for input(s): "TSH", "T4TOTAL", "T3FREE", "THYROIDAB" in the last 72 hours.  Invalid input(s): "FREET3"  Other results:  Imaging   CARDIAC CATHETERIZATION  Result Date: 07/16/2023   The left ventricular ejection fraction is less than 25% by visual estimate. Findings: On milrinone 0.25 and NE 2 Ao = 97/70 (80) LV = 112/31 RA = 7 RV = 43/12 PA = 42/21 (30) PCW = 21 (v = 25) Fick cardiac output/index = 3.3/1.9 PVR = 2.7 WU SVR = 1778 FA sat = 95% PA sat = 67%, 67% PAPi = 3.0 Assessment: 1. Severe NICM EF < 10% 2. Normal  coronaries 3. Elevated filling pressures and low cardiac output despite milrinone/NE support Plan/Discussion: With biventricular dysfunction, ideally would need transplant but with ongoing tobacco use will need to consider bridge LVAD. cMRI tomorrow. [name], MD 3:23 PM  Medications:   Scheduled Medications:  buprenorphine-naloxone  1 tablet Sublingual Daily   Chlorhexidine Gluconate Cloth  6 each Topical Daily   digoxin  0.125 mg Oral Daily   enoxaparin (LOVENOX) injection  40 mg Subcutaneous Q24H   ipratropium  1 spray Each Nare BID   loratadine  10 mg Oral QPM   mexiletine  200 mg Oral BID   mometasone-formoterol  2 puff Inhalation BID   montelukast  10 mg Oral q morning   sodium chloride flush  10-40 mL Intracatheter Q12H   sodium chloride flush  3 mL Intravenous Q12H   sodium chloride flush  3 mL Intravenous Q12H    Infusions:  sodium chloride     sodium chloride     milrinone 0.25 mcg/kg/min (07/17/23 0400)   norepinephrine (LEVOPHED) Adult infusion 2 mcg/min (07/17/23 0400)    PRN Medications: sodium chloride, sodium chloride, acetaminophen, acetaminophen, acetaminophen, alum & mag hydroxide-simeth, levalbuterol, nitroGLYCERIN, ondansetron (ZOFRAN) IV, mouth rinse, sodium chloride flush, sodium chloride flush, sodium chloride flush  Patient Profile   Joel York is a 37 y.o. male with HTN, ADD, asthma, hx drug abuse (now on suboxone x5 yrs), and seizures. Admitted with acute systolic heart failure.   Assessment/Plan  Acute biventricular systolic heart failure >> cardiogenic shock - Echo EF <20%,  RV mildly reduced, RV mod enlarged, estimated RV systolic pressure 53.31mmHg, LA mod dilated, RA mod dilated, mild-mod MR/TR - Etiology uncertain. ? 2/2 PVCs vs genetically mediated +/- hypertension.  - LHC with normal coronaries. RHC 07/24: Low CO (Fick CO/CI 3.3/1.9) on milrinone 0.25 + NE 2 - cMRI today - NYHA III on admit. Improving.  - Initial CO-OX 30%. Lactic acid  2.5>>cleared. CO-OX now 65% on milrinone 0.25 + NE 2 - CVP 4. Start Torsemide 20 mg daily - Continue digoxin 0.125 - GDMT as tolerated - Ideally would pursue transplant workup but  given tobacco use need to consider bridge with LVAD. Will start VAD workup.   Hypertension - BP soft this admission  Chest pressure - HsTrop 39>43>37>40, d/t demand ischemia - No CAD on cath  PVCs - high burden noted on tele - snores, will need eventual sleep study  - Suppressed with Mexiletine 200 mg BID - TSH 4.9, T4 0.79 - Keep K>4 and Mg >2  AKI - SCr 1.02 in March, baseline - Scr peaked at 1.9, improved to 1.60. Continue to support CO.  Tobacco use - smokes 3-6 cigarettes / day - cessation discussed  Hx drug abuse - Continue suboxone  Elevated LFTs - In setting of low-output HF, improving  9. Iron deficiency - TSat 20 and ferritin 49, hgb 18.7 - could consider feraheme prior to discharge - only AFTER cMRI  10. Hypokalemia/hypomagnesemia - K 3.5 and mag 1.8 - Supp aggressivley  Length of Stay: 3  [name], [name], PA-C  07/17/2023, 7:50 AM  Advanced Heart Failure Team Pager [phone] (M-F; 7a - 5p)  Please contact [facility] for night-coverage after hours (5p -7a ) and weekends on [email or web address]   Agree with above.   Remains on milrinone and NE 2. Co-ox ok. Volume ok.   Cath yesterday normal cors. EF 10% CI 1.85 on NE and milrinone  Feeling better. No CP or SOB.   General:  Sitting up in bed No resp difficulty HEENT: normal Neck: supple. no JVD. Carotids 2+ bilat; no bruits. No lymphadenopathy or thryomegaly appreciated. Cor Regular rate & rhythm. +s3 Lungs: clear Abdomen: soft, nontender, nondistended. No hepatosplenomegaly. No bruits or masses. Good bowel sounds. Extremities: no cyanosis, clubbing, rash, edema Neuro: alert & orientedx3, cranial nerves grossly intact. moves all 4 extremities w/o difficulty. Affect pleasant  He has severe NICM of unclear etiology. For  MRI today. PVCs suppressed on mexilitene.   Will continue to try an wean NE watching co-ox closely. Titrate GDMT today.   Ideally would consider transplant if heart not recovering but tobacco use an issue. VAD team to see today.   CRITICAL CARE Performed by: [name]  Total critical care time: 45 minutes  Critical care time was exclusive of separately billable procedures and treating other patients.  Critical care was necessary to treat or prevent imminent or life-threatening deterioration.  Critical care was time spent personally by me (independent of midlevel providers or residents) on the following activities: development of treatment plan with patient and/or surrogate as well as nursing, discussions with consultants, evaluation of patient's response to treatment, examination of patient, obtaining history from patient or surrogate, ordering and performing treatments and interventions, ordering and review of laboratory studies, ordering and review of radiographic studies, pulse oximetry and re-evaluation of patient's condition.  [name], MD  11:09 AM

## 2023-07-18 ENCOUNTER — Inpatient Hospital Stay (HOSPITAL_COMMUNITY): Payer: Medicaid Other

## 2023-07-18 DIAGNOSIS — I110 Hypertensive heart disease with heart failure: Secondary | ICD-10-CM | POA: Diagnosis not present

## 2023-07-18 DIAGNOSIS — Z01810 Encounter for preprocedural cardiovascular examination: Secondary | ICD-10-CM

## 2023-07-18 DIAGNOSIS — E876 Hypokalemia: Secondary | ICD-10-CM | POA: Diagnosis not present

## 2023-07-18 DIAGNOSIS — I3139 Other pericardial effusion (noninflammatory): Secondary | ICD-10-CM | POA: Diagnosis not present

## 2023-07-18 DIAGNOSIS — Z515 Encounter for palliative care: Secondary | ICD-10-CM | POA: Diagnosis not present

## 2023-07-18 DIAGNOSIS — K429 Umbilical hernia without obstruction or gangrene: Secondary | ICD-10-CM | POA: Diagnosis not present

## 2023-07-18 DIAGNOSIS — Z7189 Other specified counseling: Secondary | ICD-10-CM

## 2023-07-18 DIAGNOSIS — D509 Iron deficiency anemia, unspecified: Secondary | ICD-10-CM | POA: Diagnosis not present

## 2023-07-18 DIAGNOSIS — I255 Ischemic cardiomyopathy: Secondary | ICD-10-CM | POA: Diagnosis not present

## 2023-07-18 DIAGNOSIS — J810 Acute pulmonary edema: Secondary | ICD-10-CM | POA: Diagnosis not present

## 2023-07-18 DIAGNOSIS — I509 Heart failure, unspecified: Secondary | ICD-10-CM | POA: Diagnosis not present

## 2023-07-18 DIAGNOSIS — Z01818 Encounter for other preprocedural examination: Secondary | ICD-10-CM | POA: Diagnosis not present

## 2023-07-18 DIAGNOSIS — R918 Other nonspecific abnormal finding of lung field: Secondary | ICD-10-CM | POA: Diagnosis not present

## 2023-07-18 DIAGNOSIS — R570 Cardiogenic shock: Secondary | ICD-10-CM | POA: Diagnosis not present

## 2023-07-18 DIAGNOSIS — I5082 Biventricular heart failure: Secondary | ICD-10-CM | POA: Diagnosis not present

## 2023-07-18 DIAGNOSIS — I5021 Acute systolic (congestive) heart failure: Secondary | ICD-10-CM | POA: Diagnosis not present

## 2023-07-18 DIAGNOSIS — N2889 Other specified disorders of kidney and ureter: Secondary | ICD-10-CM | POA: Diagnosis not present

## 2023-07-18 LAB — COMPREHENSIVE METABOLIC PANEL: ALT: 41 U/L (ref 0–44)

## 2023-07-18 NOTE — Consult Note (Signed)
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 Graylin Joel York Joel York is an 37 y.o. male.  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  He reports noticing the symptoms for the past month but thinks it has been worsening and became severe over the weekend prior to admission.  On presentation his HsTrop was 39>43>37>40.  2D echocardiogram on 07/14/2023 showed an ejection fraction of less than 20% with global hypokinesis and severe LV dilation to 7.4 cm.  Right ventricular systolic function was mildly reduced with moderate RV enlargement.  There is mild to moderate MR and TR.  His initial Co-ox was 30% and he was started on milrinone 0.25 and norepinephrine.  A cardiac MRI showed the LV to be markedly dilated with an ejection fraction of 10%.  RV ejection fraction was 17%.  On milrinone 0.375 and norepinephrine 3 his Co-ox has increased to 71%.  He has felt better without chest pain or shortness of breath.  R/L heart catheterization showed normal coronary arteries.  Filling pressures were elevated with low cardiac output on milrinone and norepinephrine:  On milrinone 0.25 and NE 2    Ao = 97/70 (80) LV = 112/31 RA = 7 RV = 43/12 PA = 42/21 (30) PCW = 21 (v = 25) Fick cardiac output/index = 3.3/1.9 PVR = 2.7 WU SVR = 1778 FA sat = 95% PA sat = 67%, 67% PAPi = 3.0  His wife is with him today.  He works as a Cabin crew.  He has 2 young children.  He does not see a dentist regularly unless he has a problem.  He tells me that he smoked a  few cigarettes per day until about 2 weeks ago when he quit.  He has quit smoking in the past for periods of time but restarted.  He denies drug use at this time and has been on suboxone for 5 years.  Past Medical History:  Diagnosis Date   Acid reflux    ADHD (attention deficit hyperactivity disorder)    Asthma    Back pain    Seizures (HCC)    Resolved    Past Surgical History:  Procedure Laterality Date   CHOLECYSTECTOMY     RIGHT/LEFT HEART CATH AND CORONARY ANGIOGRAPHY N/A 07/16/2023   Procedure: RIGHT/LEFT HEART CATH AND CORONARY ANGIOGRAPHY;  Surgeon: Dolores Patty, MD;  Location: [facility];  Service: Cardiovascular;  Laterality: N/A;   WISDOM TOOTH EXTRACTION      Family History  Problem Relation Age of Onset   Crohn's disease Maternal Grandmother    Crohn's disease Maternal Uncle    Hypertension Mother        Living   Congestive Heart Failure Maternal Grandmother    Hypertension Other        Maternal Aunts & Uncles    Social History:  reports that he has been smoking cigarettes. His smokeless tobacco use includes chew. He reports that he does not currently use alcohol. He reports that he does  not currently use drugs.  Allergies:  Allergies  Allergen Reactions   Other Anaphylaxis    Tree Nuts   Peanuts [Peanut Oil] Anaphylaxis    Medications: I have reviewed the patient's current medications. Prior to Admission:  Medications Prior to Admission  Medication Sig Dispense Refill Last Dose   albuterol (PROVENTIL HFA;VENTOLIN HFA) 108 (90 Base) MCG/ACT inhaler Inhale 2 puffs into the lungs every 6 (six) hours as needed for wheezing or shortness of breath. Shortness of breath 1 Inhaler 0 07/13/2023   calcium carbonate (TUMS - DOSED IN MG ELEMENTAL CALCIUM) 500 MG chewable tablet Chew 1 tablet by mouth daily as needed for indigestion.   Past Week   fluticasone (FLONASE) 50 MCG/ACT nasal spray Place 1 spray into both nostrils daily. (Patient taking  differently: Place 1 spray into both nostrils daily as needed for allergies.) 15.8 mL 0 Past Week   Fluticasone-Salmeterol (ADVAIR) 100-50 MCG/DOSE AEPB Inhale 1 puff into the lungs every 12 (twelve) hours. (Patient taking differently: Inhale 1 puff into the lungs daily as needed (for shortness of breath).) 60 each 1 UNKNOWN   ipratropium (ATROVENT) 0.03 % nasal spray Place 2 sprays into both nostrils 2 (two) times daily. (Patient taking differently: Place 2 sprays into both nostrils 2 (two) times daily as needed for rhinitis.) 30 mL 0 Past Week   levocetirizine (XYZAL) 5 MG tablet Take 1 tablet (5 mg total) by mouth every evening. (Patient taking differently: Take 5 mg by mouth daily as needed for allergies.) 90 tablet 0 Past Week   ondansetron (ZOFRAN-ODT) 4 MG disintegrating tablet Take 1 tablet (4 mg total) by mouth every 8 (eight) hours as needed for nausea or vomiting. 20 tablet 0 Past Month   SUBOXONE 8-2 MG FILM Place 1 Film under the tongue daily.   07/13/2023 at am   azelastine (ASTELIN) 0.1 % nasal spray Place 1 spray into both nostrils 2 (two) times daily. Use in each nostril as directed (Patient not taking: Reported on 07/14/2023) 30 mL 0 Not Taking   Scheduled:  buprenorphine-naloxone  1 tablet Sublingual Daily   Chlorhexidine Gluconate Cloth  6 each Topical Daily   digoxin  0.125 mg Oral Daily   enoxaparin (LOVENOX) injection  40 mg Subcutaneous Q24H   ipratropium  1 spray Each Nare BID   loratadine  10 mg Oral QPM   mexiletine  200 mg Oral BID   mometasone-formoterol  2 puff Inhalation BID   montelukast  10 mg Oral q morning   potassium chloride  40 mEq Oral BID   sodium chloride flush  10-40 mL Intracatheter Q12H   sodium chloride flush  3 mL Intravenous Q12H   sodium chloride flush  3 mL Intravenous Q12H   spironolactone  12.5 mg Oral Daily   Continuous:  sodium chloride     sodium chloride     milrinone 0.375 mcg/kg/min (07/18/23 1336)   norepinephrine (LEVOPHED) Adult  infusion 3 mcg/min (07/18/23 1300)   NWG:NFAOZH chloride, sodium chloride, acetaminophen, acetaminophen, acetaminophen, alum & mag hydroxide-simeth, levalbuterol, nitroGLYCERIN, ondansetron (ZOFRAN) IV, mouth rinse, sodium chloride flush, sodium chloride flush, sodium chloride flush  Results for orders placed or performed during the hospital encounter of 07/14/23 (from the past 48 hour(s))  I-STAT 7, (LYTES, BLD GAS, ICA, H+H)     Status: Abnormal   Collection Time: 07/16/23  2:40 PM  Result Value Ref Range   pH, Arterial 7.456 (H) 7.35 - 7.45   pCO2 arterial 35.1 32 - 48 mmHg  pO2, Arterial 70 (L) 83 - 108 mmHg   Bicarbonate 24.7 20.0 - 28.0 mmol/L   TCO2 26 22 - 32 mmol/L   O2 Saturation 95 %   Acid-Base Excess 1.0 0.0 - 2.0 mmol/L   Sodium 145 135 - 145 mmol/L   Potassium 2.9 (L) 3.5 - 5.1 mmol/L   Calcium, Ion 0.75 (LL) 1.15 - 1.40 mmol/L   HCT 52.0 39.0 - 52.0 %   Hemoglobin 17.7 (H) 13.0 - 17.0 g/dL   Sample type ARTERIAL    Comment NOTIFIED PHYSICIAN   POCT I-Stat EG7     Status: Abnormal   Collection Time: 07/16/23  2:47 PM  Result Value Ref Range   pH, Ven 7.394 7.25 - 7.43   pCO2, Ven 46.5 44 - 60 mmHg   pO2, Ven 36 32 - 45 mmHg   Bicarbonate 28.4 (H) 20.0 - 28.0 mmol/L   TCO2 30 22 - 32 mmol/L   O2 Saturation 68 %   Acid-Base Excess 2.0 0.0 - 2.0 mmol/L   Sodium 143 135 - 145 mmol/L   Potassium 3.1 (L) 3.5 - 5.1 mmol/L   Calcium, Ion 0.89 (LL) 1.15 - 1.40 mmol/L   HCT 55.0 (H) 39.0 - 52.0 %   Hemoglobin 18.7 (H) 13.0 - 17.0 g/dL   Sample type VENOUS    Comment NOTIFIED PHYSICIAN   POCT I-Stat EG7     Status: Abnormal   Collection Time: 07/16/23  2:47 PM  Result Value Ref Range   pH, Ven 7.399 7.25 - 7.43   pCO2, Ven 50.3 44 - 60 mmHg   pO2, Ven 35 32 - 45 mmHg   Bicarbonate 31.1 (H) 20.0 - 28.0 mmol/L   TCO2 33 (H) 22 - 32 mmol/L   O2 Saturation 67 %   Acid-Base Excess 4.0 (H) 0.0 - 2.0 mmol/L   Sodium 141 135 - 145 mmol/L   Potassium 3.6 3.5 - 5.1 mmol/L    Calcium, Ion 1.03 (L) 1.15 - 1.40 mmol/L   HCT 58.0 (H) 39.0 - 52.0 %   Hemoglobin 19.7 (H) 13.0 - 17.0 g/dL   Sample type VENOUS    Comment NOTIFIED PHYSICIAN   Comprehensive metabolic panel     Status: Abnormal   Collection Time: 07/17/23  4:26 AM  Result Value Ref Range   Sodium 136 135 - 145 mmol/L   Potassium 3.5 3.5 - 5.1 mmol/L   Chloride 93 (L) 98 - 111 mmol/L   CO2 30 22 - 32 mmol/L   Glucose, Bld 157 (H) 70 - 99 mg/dL    Comment: Glucose reference range applies only to samples taken after fasting for at least 8 hours.   BUN 14 6 - 20 mg/dL   Creatinine, Ser 1.88 (H) 0.61 - 1.24 mg/dL   Calcium 8.4 (L) 8.9 - 10.3 mg/dL   Total Protein 6.3 (L) 6.5 - 8.1 g/dL   Albumin 3.4 (L) 3.5 - 5.0 g/dL   AST 40 15 - 41 U/L   ALT 54 (H) 0 - 44 U/L   Alkaline Phosphatase 121 38 - 126 U/L   Total Bilirubin 1.6 (H) 0.3 - 1.2 mg/dL   GFR, Estimated 57 (L) >60 mL/min    Comment: (NOTE) Calculated using the CKD-EPI Creatinine Equation (2021)    Anion gap 13 5 - 15    Comment: Performed at [facility], [address] [place], [place] [address]  Cooxemetry Panel (carboxy, met, total hgb, O2 sat)     Status:  Abnormal   Collection Time: 07/17/23  4:26 AM  Result Value Ref Range   Total hemoglobin 19.0 (H) 12.0 - 16.0 g/dL   O2 Saturation 16.1 %   Carboxyhemoglobin 2.2 (H) 0.5 - 1.5 %   Methemoglobin <0.7 0.0 - 1.5 %    Comment: Performed at [facility], [address] [place], [place] [address]  Magnesium     Status: None   Collection Time: 07/17/23  4:26 AM  Result Value Ref Range   Magnesium 1.8 1.7 - 2.4 mg/dL    Comment: Performed at [facility], [address] [place], [place] [address]  CBC with Differential/Platelet     Status: Abnormal   Collection Time: 07/17/23  4:26 AM  Result Value Ref Range   WBC 9.4 4.0 - 10.5 K/uL   RBC 6.45 (H) 4.22 - 5.81 MIL/uL   Hemoglobin 18.7 (H) 13.0 - 17.0 g/dL   HCT 11.9 (H) 14.7 - 82.9 %   MCV 87.0 80.0 - 100.0 fL    MCH 29.0 26.0 - 34.0 pg   MCHC 33.3 30.0 - 36.0 g/dL   RDW 56.2 13.0 - 86.5 %   Platelets 291 150 - 400 K/uL   nRBC 0.0 0.0 - 0.2 %   Neutrophils Relative % 62 %   Neutro Abs 5.8 1.7 - 7.7 K/uL   Lymphocytes Relative 26 %   Lymphs Abs 2.5 0.7 - 4.0 K/uL   Monocytes Relative 9 %   Monocytes Absolute 0.9 0.1 - 1.0 K/uL   Eosinophils Relative 2 %   Eosinophils Absolute 0.2 0.0 - 0.5 K/uL   Basophils Relative 1 %   Basophils Absolute 0.1 0.0 - 0.1 K/uL   Immature Granulocytes 0 %   Abs Immature Granulocytes 0.02 0.00 - 0.07 K/uL    Comment: Performed at [facility], [address], [place], [place] [address]  ABO/Rh     Status: None   Collection Time: 07/17/23  4:26 AM  Result Value Ref Range   ABO/RH(D)      AB POS Performed at [facility], [address], [place], [place] [address]   Cooxemetry Panel (carboxy, met, total hgb, O2 sat)     Status: Abnormal   Collection Time: 07/17/23 12:24 PM  Result Value Ref Range   Total hemoglobin 19.1 (H) 12.0 - 16.0 g/dL   O2 Saturation 84.1 %   Carboxyhemoglobin 1.4 0.5 - 1.5 %   Methemoglobin <0.7 0.0 - 1.5 %    Comment: Performed at [facility], [address], [place], [place] [address]  Cooxemetry Panel (carboxy, met, total hgb, O2 sat)     Status: Abnormal   Collection Time: 07/17/23  1:06 PM  Result Value Ref Range   Total hemoglobin 18.9 (H) 12.0 - 16.0 g/dL   O2 Saturation 10.2 %   Carboxyhemoglobin 1.4 0.5 - 1.5 %   Methemoglobin 0.8 0.0 - 1.5 %    Comment: Performed at [facility], [address], [place], [place] [address]  Lactate dehydrogenase     Status: Abnormal   Collection Time: 07/17/23  3:55 PM  Result Value Ref Range   LDH 239 (H) 98 - 192 U/L    Comment: Performed at [facility], [address], [place], [place] [address]  Uric acid     Status: Abnormal   Collection Time: 07/17/23  3:55 PM  Result Value Ref Range   Uric Acid, Serum 9.9 (  H) 3.7 - 8.6 mg/dL    Comment:  HEMOLYSIS AT THIS LEVEL MAY AFFECT RESULT Performed at [facility], [address] [place], [place] [address]   Antithrombin III     Status: None   Collection Time: 07/17/23  3:55 PM  Result Value Ref Range   AntiThromb III Func 109 75 - 120 %    Comment: Performed at [facility], [address], [place], [place] [address]  Protime-INR     Status: None   Collection Time: 07/17/23  3:55 PM  Result Value Ref Range   Prothrombin Time 14.6 11.4 - 15.2 seconds   INR 1.1 0.8 - 1.2    Comment: (NOTE) INR goal varies based on device and disease states. Performed at [facility], [address] [place], [place] [address]   APTT     Status: None   Collection Time: 07/17/23  3:55 PM  Result Value Ref Range   aPTT 32 24 - 36 seconds    Comment: Performed at [facility], [address], [place], [place] [address]  PSA     Status: None   Collection Time: 07/17/23  3:55 PM  Result Value Ref Range   Prostatic Specific Antigen 1.03 0.00 - 4.00 ng/mL    Comment: (NOTE) While PSA levels of <=4.00 ng/ml are reported as reference range, some men with levels below 4.00 ng/ml can have prostate cancer and many men with PSA above 4.00 ng/ml do not have prostate cancer.  Other tests such as free PSA, age specific reference ranges, PSA velocity and PSA doubling time may be helpful especially in men less than 31 years old. Performed at [facility] Lab, [address] [place], [place] [address]   Rapid urine drug screen (hospital performed)     Status: Abnormal   Collection Time: 07/17/23  3:55 PM  Result Value Ref Range   Opiates NONE DETECTED NONE DETECTED   Cocaine NONE DETECTED NONE DETECTED   Benzodiazepines POSITIVE (A) NONE DETECTED   Amphetamines POSITIVE (A) NONE DETECTED   Tetrahydrocannabinol NONE DETECTED NONE DETECTED   Barbiturates NONE DETECTED NONE DETECTED    Comment: (NOTE) DRUG SCREEN FOR MEDICAL PURPOSES ONLY.  IF CONFIRMATION IS NEEDED FOR ANY  PURPOSE, NOTIFY LAB WITHIN 5 DAYS.  LOWEST DETECTABLE LIMITS FOR URINE DRUG SCREEN Drug Class                     Cutoff (ng/mL) Amphetamine and metabolites    1000 Barbiturate and metabolites    200 Benzodiazepine                 200 Opiates and metabolites        300 Cocaine and metabolites        300 THC                            50 Performed at [facility], [address], [place], [place] [address]   Urinalysis, Routine w reflex microscopic -Urine, Clean Catch     Status: None   Collection Time: 07/17/23  3:55 PM  Result Value Ref Range   Color, Urine YELLOW YELLOW   APPearance CLEAR CLEAR   Specific Gravity, Urine 1.016 1.005 - 1.030   pH 5.0 5.0 - 8.0   Glucose, UA NEGATIVE NEGATIVE mg/dL   Hgb urine dipstick NEGATIVE NEGATIVE   Bilirubin Urine NEGATIVE  NEGATIVE   Ketones, ur NEGATIVE NEGATIVE mg/dL   Protein, ur NEGATIVE NEGATIVE mg/dL   Nitrite NEGATIVE NEGATIVE   Leukocytes,Ua NEGATIVE NEGATIVE    Comment: Performed at [facility], [address] [place], [place] [address]  Hepatitis B surface antigen     Status: None   Collection Time: 07/17/23  3:55 PM  Result Value Ref Range   Hepatitis B Surface Ag NON REACTIVE NON REACTIVE    Comment: Performed at [facility], [address] [place], [place] [address]  Hepatitis B surface antibody,qualitative     Status: Abnormal   Collection Time: 07/17/23  3:55 PM  Result Value Ref Range   Hep B S Ab Reactive (A) NON REACTIVE    Comment: (NOTE) Consistent with immunity, greater than 9.9 mIU/mL.  Performed at [facility], [address], [place], [place] [address]   Hepatitis B core antibody, total     Status: None   Collection Time: 07/17/23  3:55 PM  Result Value Ref Range   Hep B Core Total Ab NON REACTIVE NON REACTIVE    Comment: Performed at [facility], [address] [place], [place] [address]  Hepatitis C antibody     Status: None   Collection Time: 07/17/23  3:55 PM   Result Value Ref Range   HCV Ab NON REACTIVE NON REACTIVE    Comment: (NOTE) Nonreactive HCV antibody screen is consistent with no HCV infections,  unless recent infection is suspected or other evidence exists to indicate HCV infection.  Performed at [facility], [address], [place], [place] [address]   Prealbumin     Status: None   Collection Time: 07/17/23  3:55 PM  Result Value Ref Range   Prealbumin 23 18 - 38 mg/dL    Comment: Performed at [facility], [address], [place], [place] [address]  Cooxemetry Panel (carboxy, met, total hgb, O2 sat)     Status: Abnormal   Collection Time: 07/17/23  4:45 PM  Result Value Ref Range   Total hemoglobin 18.6 (H) 12.0 - 16.0 g/dL   O2 Saturation 57 %   Carboxyhemoglobin 1.9 (H) 0.5 - 1.5 %   Methemoglobin <0.7 0.0 - 1.5 %    Comment: Performed at [facility], [address], [place], [place] [address]  Type and screen [facility]     Status: None   Collection Time: 07/17/23  5:50 PM  Result Value Ref Range   ABO/RH(D) AB POS    Antibody Screen NEG    Sample Expiration      07/20/2023,2359 Performed at [facility] Lab, [address], [place], [place] [address]   CG4 I-STAT (Lactic acid)     Status: Abnormal   Collection Time: 07/17/23  6:49 PM  Result Value Ref Range   Lactic Acid, Venous 2.0 (HH) 0.5 - 1.9 mmol/L  Comprehensive metabolic panel     Status: Abnormal   Collection Time: 07/18/23  4:22 AM  Result Value Ref Range   Sodium 134 (L) 135 - 145 mmol/L   Potassium 3.8 3.5 - 5.1 mmol/L   Chloride 94 (L) 98 - 111 mmol/L   CO2 30 22 - 32 mmol/L   Glucose, Bld 224 (H) 70 - 99 mg/dL    Comment: Glucose reference range applies only to samples taken after fasting for at least 8 hours.   BUN 16 6 - 20 mg/dL  Creatinine, Ser 1.61 (H) 0.61 - 1.24 mg/dL   Calcium 8.4 (L) 8.9 - 10.3 mg/dL   Total Protein 6.1 (L) 6.5 - 8.1 g/dL   Albumin 3.2 (L) 3.5 - 5.0 g/dL   AST 28 15 - 41 U/L    ALT 41 0 - 44 U/L   Alkaline Phosphatase 101 38 - 126 U/L   Total Bilirubin 1.3 (H) 0.3 - 1.2 mg/dL   GFR, Estimated 56 (L) >60 mL/min    Comment: (NOTE) Calculated using the CKD-EPI Creatinine Equation (2021)    Anion gap 10 5 - 15    Comment: Performed at [facility] Lab, [address] [place], [place] [address]  Cooxemetry Panel (carboxy, met, total hgb, O2 sat)     Status: Abnormal   Collection Time: 07/18/23  4:22 AM  Result Value Ref Range   Total hemoglobin 17.1 (H) 12.0 - 16.0 g/dL   O2 Saturation 60.4 %   Carboxyhemoglobin 1.9 (H) 0.5 - 1.5 %   Methemoglobin <0.7 0.0 - 1.5 %    Comment: Performed at [facility] Lab, [address], [place], [place] [address]  Magnesium     Status: None   Collection Time: 07/18/23  4:22 AM  Result Value Ref Range   Magnesium 2.2 1.7 - 2.4 mg/dL    Comment: Performed at [facility], [address] [place], [place] [address]  CBC with Differential/Platelet     Status: Abnormal   Collection Time: 07/18/23  4:22 AM  Result Value Ref Range   WBC 7.9 4.0 - 10.5 K/uL   RBC 5.74 4.22 - 5.81 MIL/uL   Hemoglobin 17.1 (H) 13.0 - 17.0 g/dL   HCT 78.2 95.6 - 21.3 %   MCV 90.1 80.0 - 100.0 fL   MCH 29.8 26.0 - 34.0 pg   MCHC 33.1 30.0 - 36.0 g/dL   RDW 08.6 57.8 - 46.9 %   Platelets 255 150 - 400 K/uL   nRBC 0.0 0.0 - 0.2 %   Neutrophils Relative % 52 %   Neutro Abs 4.1 1.7 - 7.7 K/uL   Lymphocytes Relative 32 %   Lymphs Abs 2.6 0.7 - 4.0 K/uL   Monocytes Relative 11 %   Monocytes Absolute 0.9 0.1 - 1.0 K/uL   Eosinophils Relative 4 %   Eosinophils Absolute 0.3 0.0 - 0.5 K/uL   Basophils Relative 1 %   Basophils Absolute 0.1 0.0 - 0.1 K/uL   Immature Granulocytes 0 %   Abs Immature Granulocytes 0.01 0.00 - 0.07 K/uL    Comment: Performed at [facility] Lab, [address] [place], [place] [address]    MR CARDIAC VELOCITY FLOW MAP  Result Date: 07/17/2023 CLINICAL DATA:  Nonischemic cardiomyopathy EXAM: CARDIAC MRI  TECHNIQUE: The patient was scanned on a 1.5 Tesla GE magnet. A dedicated cardiac coil was used. Functional imaging was done using Fiesta sequences. 2,3, and 4 chamber views were done to assess for RWMA's. Modified Simpson's rule using a short axis stack was used to calculate an ejection fraction on a dedicated work [employer]. The patient received 10 cc of Gadavist After 10 minutes inversion recovery sequences were used to assess for infiltration and scar tissue. FINDINGS: Limited images of the lung fields showed mild atelectasis at bases. Small circumferential pericardial effusion. Severely dilated left ventricle with normal wall thickness. Global hypokinesis, EF 10%. No LV thrombus. Moderately dilated right ventricle with EF 17%. Moderate left atrial enlargement, mild right  atrial enlargement. The aortic valve was not well-visualized. No significant aortic stenosis, mild regurgitation with regurgitant fraction 15%. Visually, mitral regurgitation looks moderate. Regurgitant fraction calculation did not appear accurate. On delayed enhancement imaging, there was mid-wall late gadolinium enhancement (LGE) at the basal to mid inferoseptal RV insertion site. MEASUREMENTS: MEASUREMENTS LVEDV 409 mL LVEDVi 233 mL/m2 LVSV 40 mL LVEF 10% RVEDV 285 mL RVEDVi 162 mL/m2 RVSV 50 mL RVEF 17% Aortic regurgitant fraction 15% T1 1108, ECV 33% IMPRESSION: 1. Severely dilated LV with global hypokinesis, EF 10%. No LV thrombus. 2.  Moderately dilated RV with EF 17%. 3.  Visually moderate mitral regurgitation. 4. LGE at inferoseptal RV insertion site, this is nonspecific and seen with pressure/volume overload. 5. Mildly elevated extracellular volume percentage suggesting mildly elevated LV fibrotic content. Dalton Mclean Electronically Signed   By: Marca Ancona M.D.   On: 07/17/2023 17:32   MR CARDIAC VELOCITY FLOW MAP  Result Date: 07/17/2023 CLINICAL DATA:  Nonischemic cardiomyopathy EXAM: CARDIAC MRI  TECHNIQUE: The patient was scanned on a 1.5 Tesla GE magnet. A dedicated cardiac coil was used. Functional imaging was done using Fiesta sequences. 2,3, and 4 chamber views were done to assess for RWMA's. Modified Simpson's rule using a short axis stack was used to calculate an ejection fraction on a dedicated work [employer] officer, [employer]. The patient received 10 cc of Gadavist After 10 minutes inversion recovery sequences were used to assess for infiltration and scar tissue. FINDINGS: Limited images of the lung fields showed mild atelectasis at bases. Small circumferential pericardial effusion. Severely dilated left ventricle with normal wall thickness. Global hypokinesis, EF 10%. No LV thrombus. Moderately dilated right ventricle with EF 17%. Moderate left atrial enlargement, mild right atrial enlargement. The aortic valve was not well-visualized. No significant aortic stenosis, mild regurgitation with regurgitant fraction 15%. Visually, mitral regurgitation looks moderate. Regurgitant fraction calculation did not appear accurate. On delayed enhancement imaging, there was mid-wall late gadolinium enhancement (LGE) at the basal to mid inferoseptal RV insertion site. MEASUREMENTS: MEASUREMENTS LVEDV 409 mL LVEDVi 233 mL/m2 LVSV 40 mL LVEF 10% RVEDV 285 mL RVEDVi 162 mL/m2 RVSV 50 mL RVEF 17% Aortic regurgitant fraction 15% T1 1108, ECV 33% IMPRESSION: 1. Severely dilated LV with global hypokinesis, EF 10%. No LV thrombus. 2.  Moderately dilated RV with EF 17%. 3.  Visually moderate mitral regurgitation. 4. LGE at inferoseptal RV insertion site, this is nonspecific and seen with pressure/volume overload. 5. Mildly elevated extracellular volume percentage suggesting mildly elevated LV fibrotic content. Dalton Mclean Electronically Signed   By: Marca Ancona M.D.   On: 07/17/2023 17:32   MR CARDIAC MORPHOLOGY W WO CONTRAST  Result Date: 07/17/2023 CLINICAL DATA:  Nonischemic cardiomyopathy EXAM: CARDIAC MRI  TECHNIQUE: The patient was scanned on a 1.5 Tesla GE magnet. A dedicated cardiac coil was used. Functional imaging was done using Fiesta sequences. 2,3, and 4 chamber views were done to assess for RWMA's. Modified Simpson's rule using a short axis stack was used to calculate an ejection fraction on a dedicated work [employer]. The patient received 10 cc of Gadavist After 10 minutes inversion recovery sequences were used to assess for infiltration and scar tissue. FINDINGS: Limited images of the lung fields showed mild atelectasis at bases. Small circumferential pericardial effusion. Severely dilated left ventricle with normal wall thickness. Global hypokinesis, EF 10%. No LV thrombus. Moderately dilated right ventricle with EF 17%. Moderate left atrial enlargement, mild right atrial enlargement. The aortic valve was not  well-visualized. No significant aortic stenosis, mild regurgitation with regurgitant fraction 15%. Visually, mitral regurgitation looks moderate. Regurgitant fraction calculation did not appear accurate. On delayed enhancement imaging, there was mid-wall late gadolinium enhancement (LGE) at the basal to mid inferoseptal RV insertion site. MEASUREMENTS: MEASUREMENTS LVEDV 409 mL LVEDVi 233 mL/m2 LVSV 40 mL LVEF 10% RVEDV 285 mL RVEDVi 162 mL/m2 RVSV 50 mL RVEF 17% Aortic regurgitant fraction 15% T1 1108, ECV 33% IMPRESSION: 1. Severely dilated LV with global hypokinesis, EF 10%. No LV thrombus. 2.  Moderately dilated RV with EF 17%. 3.  Visually moderate mitral regurgitation. 4. LGE at inferoseptal RV insertion site, this is nonspecific and seen with pressure/volume overload. 5. Mildly elevated extracellular volume percentage suggesting mildly elevated LV fibrotic content. Dalton Mclean Electronically Signed   By: Marca Ancona M.D.   On: 07/17/2023 17:32   CARDIAC CATHETERIZATION  Result Date: 07/16/2023   The left ventricular ejection fraction is less than 25% by visual  estimate. Findings: On milrinone 0.25 and NE 2 Ao = 97/70 (80) LV = 112/31 RA = 7 RV = 43/12 PA = 42/21 (30) PCW = 21 (v = 25) Fick cardiac output/index = 3.3/1.9 PVR = 2.7 WU SVR = 1778 FA sat = 95% PA sat = 67%, 67% PAPi = 3.0 Assessment: 1. Severe NICM EF < 10% 2. Normal coronaries 3. Elevated filling pressures and low cardiac output despite milrinone/NE support Plan/Discussion: With biventricular dysfunction, ideally would need transplant but with ongoing tobacco use will need to consider bridge LVAD. cMRI tomorrow. [name], MD 3:23 PM   Review of Systems  Constitutional:  Positive for activity change and fatigue. Negative for chills and fever.  HENT: Negative.  Negative for dental problem.   Eyes: Negative.   Respiratory:  Positive for chest tightness and shortness of breath.   Cardiovascular:  Positive for chest pain and leg swelling.  Gastrointestinal: Negative.   Endocrine: Negative.   Genitourinary: Negative.   Musculoskeletal: Negative.   Skin: Negative.   Allergic/Immunologic: Negative.   Neurological:  Negative for dizziness and syncope.  Hematological: Negative.   Psychiatric/Behavioral: Negative.     Blood pressure (!) 113/90, pulse (!) 118, temperature 98.5 F (36.9 C), temperature source Oral, resp. rate 17, height 5\' 7"  (1.702 m), weight 67.4 kg, SpO2 96%. Physical Exam Constitutional:      Appearance: Normal appearance. He is well-developed and normal weight.  HENT:     Head: Normocephalic and atraumatic.     Mouth/Throat:     Mouth: Mucous membranes are moist.     Pharynx: Oropharynx is clear.  Eyes:     Extraocular Movements: Extraocular movements intact.     Conjunctiva/sclera: Conjunctivae normal.     Pupils: Pupils are equal, round, and reactive to light.  Cardiovascular:     Rate and Rhythm: Normal rate and regular rhythm.     Pulses: Normal pulses.     Heart sounds: Normal heart sounds. No murmur heard. Pulmonary:     Effort: Pulmonary effort is  normal.     Breath sounds: Normal breath sounds.  Abdominal:     General: Abdomen is flat. Bowel sounds are normal. There is no distension.     Palpations: Abdomen is soft.     Tenderness: There is no abdominal tenderness.  Musculoskeletal:        General: No swelling. Normal range of motion.     Cervical back: Normal range of motion and neck supple.  Skin:    General: Skin is  warm and dry.     Coloration: Skin is not jaundiced.  Neurological:     General: No focal deficit present.     Mental Status: He is alert and oriented to person, place, and time.  Psychiatric:        Mood and Affect: Mood normal.        Behavior: Behavior normal.   ECHOCARDIOGRAM REPORT       Patient Name:   Joel York Date of Exam: 07/14/2023  Medical Rec #:  [id number]    Height:       67.0 in  Accession #:    [id number]   Weight:       153.0 lb  Date of Birth:  Jan 30, 1986    BSA:          1.805 m  Patient Age:    37 years     BP:           95/57 mmHg  Patient Gender: M            HR:           112 bpm.  Exam Location:  Inpatient   Procedure: 2D Echo, Color Doppler, Cardiac Doppler and Intracardiac             Opacification Agent               REPORT CONTAINS CRITICAL RESULT    Results communicated to Dr Sharolyn Douglas at 1243 on 07/14/23.   Indications:    Congestive Heart Failure    History:        Patient has no prior history of Echocardiogram  examinations.                 CHF, Polycythemia; Arrythmias:PVC.    Sonographer:    Milbert Coulter  Referring Phys: [id number] SUBRINA SUNDIL   IMPRESSIONS     1. Left ventricular ejection fraction, by estimation, is <20%. The left  ventricle has severely decreased function. The left ventricle demonstrates  global hypokinesis. The left ventricular internal cavity size was severely  dilated. Left ventricular  diastolic parameters are indeterminate.   2. Right ventricular systolic function is mildly reduced. The right  ventricular size is moderately  enlarged. There is moderately elevated  pulmonary artery systolic pressure. The estimated right ventricular  systolic pressure is 53.9 mmHg.   3. Left atrial size was mildly dilated.   4. Right atrial size was moderately dilated.   5. The mitral valve is abnormal. Mild to moderate mitral valve  regurgitation.   6. Tricuspid valve regurgitation is mild to moderate.   7. The aortic valve is tricuspid. Aortic valve regurgitation is not  visualized. No aortic stenosis is present.   8. The inferior vena cava is dilated in size with <50% respiratory  variability, suggesting right atrial pressure of 15 mmHg.   FINDINGS   Left Ventricle: Left ventricular ejection fraction, by estimation, is  <20%. The left ventricle has severely decreased function. The left  ventricle demonstrates global hypokinesis. Definity contrast agent was  given IV to delineate the left ventricular  endocardial borders. The left ventricular internal cavity size was  severely dilated. There is no left ventricular hypertrophy. Left  ventricular diastolic parameters are indeterminate.   Right Ventricle: The right ventricular size is moderately enlarged. No  increase in right ventricular wall thickness. Right ventricular systolic  function is mildly reduced. There is moderately elevated pulmonary artery  systolic pressure. The tricuspid  regurgitant velocity is 3.12 m/s,  and with an assumed right atrial  pressure of 15 mmHg, the estimated right ventricular systolic pressure is  53.9 mmHg.   Left Atrium: Left atrial size was mildly dilated.   Right Atrium: Right atrial size was moderately dilated.   Pericardium: There is no evidence of pericardial effusion.   Mitral Valve: The mitral valve is abnormal. Mild to moderate mitral valve  regurgitation.   Tricuspid Valve: The tricuspid valve is normal in structure. Tricuspid  valve regurgitation is mild to moderate.   Aortic Valve: The aortic valve is tricuspid. Aortic  valve regurgitation is  not visualized. No aortic stenosis is present. Aortic valve mean gradient  measures 1.0 mmHg. Aortic valve peak gradient measures 1.4 mmHg. Aortic  valve area, by VTI measures 3.56  cm.   Pulmonic Valve: The pulmonic valve was grossly normal. Pulmonic valve  regurgitation is trivial.   Aorta: The aortic root and ascending aorta are structurally normal, with  no evidence of dilitation.   Venous: The inferior vena cava is dilated in size with less than 50%  respiratory variability, suggesting right atrial pressure of 15 mmHg.   IAS/Shunts: No atrial level shunt detected by color flow Doppler.     LEFT VENTRICLE  PLAX 2D  LVIDd:         7.40 cm   Diastology  LVIDs:         7.10 cm   LV e' medial: 5.00 cm/s  LV PW:         0.90 cm  LV IVS:        0.90 cm  LVOT diam:     2.20 cm  LV SV:         25  LV SV Index:   14  LVOT Area:     3.80 cm     RIGHT VENTRICLE            IVC  RV S prime:     9.03 cm/s  IVC diam: 2.30 cm  TAPSE (M-mode): 1.3 cm   LEFT ATRIUM             Index        RIGHT ATRIUM           Index  LA diam:        4.10 cm 2.27 cm/m   RA Area:     22.70 cm  LA Vol (A2C):   88.7 ml 49.15 ml/m  RA Volume:   82.40 ml  45.66 ml/m  LA Vol (A4C):   56.1 ml 31.09 ml/m  LA Biplane Vol: 70.2 ml 38.90 ml/m   AORTIC VALVE  AV Area (Vmax):    3.41 cm  AV Area (Vmean):   3.09 cm  AV Area (VTI):     3.56 cm  AV Vmax:           58.20 cm/s  AV Vmean:          43.700 cm/s  AV VTI:            0.070 m  AV Peak Grad:      1.4 mmHg  AV Mean Grad:      1.0 mmHg  LVOT Vmax:         52.20 cm/s  LVOT Vmean:        35.500 cm/s  LVOT VTI:          0.066 m  LVOT/AV VTI ratio: 0.94    AORTA  Ao Root diam: 3.50 cm  Ao  Asc diam:  3.20 cm   MR Peak grad: 67.9 mmHg   TRICUSPID VALVE  MR Mean grad: 48.0 mmHg   TR Peak grad:   38.9 mmHg  MR Vmax:      412.00 cm/s TR Vmax:        312.00 cm/s  MR Vmean:     333.0 cm/s                            SHUNTS                             Systemic VTI:  0.07 m                            Systemic Diam: 2.20 cm   Epifanio Lesches MD  Electronically signed by Epifanio Lesches MD  Signature Date/Time: 07/14/2023/12:48:51 PM        Final      hysicians  Panel Physicians Referring Physician Case Authorizing Physician  Bensimhon, Bevelyn Buckles, MD (Primary)     Procedures  RIGHT/LEFT HEART CATH AND CORONARY ANGIOGRAPHY   Conclusion      The left ventricular ejection fraction is less than 25% by visual estimate.   Findings:   On milrinone 0.25 and NE 2    Ao = 97/70 (80) LV = 112/31 RA = 7 RV = 43/12 PA = 42/21 (30) PCW = 21 (v = 25) Fick cardiac output/index = 3.3/1.9 PVR = 2.7 WU SVR = 1778 FA sat = 95% PA sat = 67%, 67% PAPi = 3.0   Assessment: 1. Severe NICM EF < 10% 2. Normal coronaries 3. Elevated filling pressures and low cardiac output despite milrinone/NE support   Plan/Discussion:    With biventricular dysfunction, ideally would need transplant but with ongoing tobacco use will need to consider bridge LVAD.    cMRI tomorrow.    [name], MD  3:23 PM     Indications  Acute systolic heart failure (HCC) [I50.21 (ICD-10-CM)]   Procedural Details  Technical Details The risks and indication of the procedure were explained. Consent was signed and placed on the chart. An appropriate timeout was taken prior to the procedure.   The left AC fossa was prepped and draped in the routine sterile fashion and anesthetized with 1% local lidocaine. The pre-existing PIV in the right [facility] was exchanged over a wire for a 5 FR venous sheath using a modified Seldinger technique. A standard Swan-Ganz catheter was used for the procedure.   After a normal Allen's test was confirmed, the right wrist was prepped and draped in the routine sterile fashion and anesthetized with 1% local lidocaine. A 5 FR arterial sheath was then placed in the right radial artery using a  modified Seldinger technique. 3mg  IV verapamil was given through the sheath. Systemic heparin was administered.  Standard catheters including a JL 3.5 and a JR 4 were used. All catheter exchanges were made over a wire.   Estimated blood loss <50 mL.   During this procedure medications were administered to achieve and maintain moderate conscious sedation while the patient's heart rate, blood pressure, and oxygen saturation were continuously monitored and I was present face-to-face 100% of this time.   Medications (Filter: Administrations occurring from 1401 to 1514 on 07/16/23)  important  Continuous medications are totaled by the amount administered until 07/16/23 1514.  0.9 %  sodium chloride infusion (mL/hr)  Total volume: 8.33 mL Date/Time Rate/Dose/Volume Action   07/16/23 1422 10 mL/hr New Bag/Given   1512  Stopped   Heparin (Porcine) in NaCl 1000-0.9 UT/500ML-% SOLN (mL)  Total volume: 1,000 mL Date/Time Rate/Dose/Volume Action   07/16/23 1425 500 mL Given   1425 500 mL Given   fentaNYL (SUBLIMAZE) injection (mcg)  Total dose: 25 mcg Date/Time Rate/Dose/Volume Action   07/16/23 1427 25 mcg Given   midazolam (VERSED) injection (mg)  Total dose: 2 mg Date/Time Rate/Dose/Volume Action   07/16/23 1427 2 mg Given   lidocaine (PF) (XYLOCAINE) 1 % injection (mL)  Total volume: 2 mL Date/Time Rate/Dose/Volume Action   07/16/23 1437 2 mL Given   Radial Cocktail/Verapamil only (mL)  Total volume: 10 mL Date/Time Rate/Dose/Volume Action   07/16/23 1440 10 mL Given   heparin sodium (porcine) injection (Units)  Total dose: 3,500 Units Date/Time Rate/Dose/Volume Action   07/16/23 1448 3,500 Units Given   0.9 %  sodium chloride infusion (mL)  Total dose: Cannot be calculated* Dosing weight: 69.4 *Administration dose not documented Date/Time Rate/Dose/Volume Action   07/16/23 1419 *Not included in total MAR Hold   acetaminophen (TYLENOL) tablet 650 mg (mg)  Total dose: Cannot be  calculated* Dosing weight: 69.4 *Administration dose not documented Date/Time Rate/Dose/Volume Action   07/16/23 1419 *Not included in total MAR Hold   acetaminophen (TYLENOL) tablet 650 mg (mg)  Total dose: Cannot be calculated* Dosing weight: 69.4 *Administration dose not documented Date/Time Rate/Dose/Volume Action   07/16/23 1419 *Not included in total MAR Hold   alum & mag hydroxide-simeth (MAALOX/MYLANTA) 200-200-20 MG/5ML suspension 30 mL (mL)  Total dose: Cannot be calculated* Dosing weight: 66.5 *Administration dose not documented Date/Time Rate/Dose/Volume Action   07/16/23 1419 *Not included in total MAR Hold   buprenorphine-naloxone (SUBOXONE) 8-2 mg per SL tablet 1 tablet (tablet)  Total dose: Cannot be calculated* Dosing weight: 69.4 *Administration dose not documented Date/Time Rate/Dose/Volume Action   07/16/23 1419 *Not included in total MAR Hold   Chlorhexidine Gluconate Cloth 2 % PADS 6 each (each)  Total dose: Cannot be calculated* Dosing weight: 67.2 *Administration dose not documented Date/Time Rate/Dose/Volume Action   07/16/23 1419 *Not included in total MAR Hold   digoxin (LANOXIN) tablet 0.125 mg (mg)  Total dose: Cannot be calculated* Dosing weight: 69.4 *Administration dose not documented Date/Time Rate/Dose/Volume Action   07/16/23 1419 *Not included in total MAR Hold   ipratropium (ATROVENT) 0.06 % nasal spray 1 spray (spray)  Total dose: Cannot be calculated* Dosing weight: 69.4 *Administration dose not documented Date/Time Rate/Dose/Volume Action   07/16/23 1419 *Not included in total MAR Hold   levalbuterol (XOPENEX) nebulizer solution 0.63 mg (mg)  Total dose: Cannot be calculated* Dosing weight: 69.4 *Administration dose not documented Date/Time Rate/Dose/Volume Action   07/16/23 1419 *Not included in total MAR Hold   loratadine (CLARITIN) tablet 10 mg (mg)  Total dose: Cannot be calculated* *Administration dose not documented Date/Time  Rate/Dose/Volume Action   07/16/23 1419 *Not included in total MAR Hold   mexiletine (MEXITIL) capsule 200 mg (mg)  Total dose: Cannot be calculated* Dosing weight: 69.4 *Administration dose not documented Date/Time Rate/Dose/Volume Action   07/16/23 1419 *Not included in total MAR Hold   mometasone-formoterol (DULERA) 100-5 MCG/ACT inhaler 2 puff (puff)  Total dose: Cannot be calculated* Dosing weight: 69.4 *Administration dose not documented Date/Time Rate/Dose/Volume Action   07/16/23 1419 *Not included in total MAR Hold   montelukast (SINGULAIR) tablet 10  mg (mg)  Total dose: Cannot be calculated* *Administration dose not documented Date/Time Rate/Dose/Volume Action   07/16/23 1419 *Not included in total MAR Hold   nitroGLYCERIN (NITROSTAT) SL tablet 0.4 mg (mg)  Total dose: Cannot be calculated* Dosing weight: 69.4 *Administration dose not documented Date/Time Rate/Dose/Volume Action   07/16/23 1419 *Not included in total MAR Hold   norepinephrine (LEVOPHED) 4mg  in (0.016 mg/mL) premix infusion (mcg/min)  Total dose: Cannot be calculated* Dosing weight: 67.3 *Administration dose not documented Date/Time Rate/Dose/Volume Action   07/16/23 1419 *Not included in total MAR Hold   sodium chloride flush (NS) 0.9 % injection 10-40 mL (mL)  Total dose: Cannot be calculated* Dosing weight: 67.2 *Administration dose not documented Date/Time Rate/Dose/Volume Action   07/16/23 1419 *Not included in total MAR Hold   sodium chloride flush (NS) 0.9 % injection 10-40 mL (mL)  Total dose: Cannot be calculated* Dosing weight: 67.2 *Administration dose not documented Date/Time Rate/Dose/Volume Action   07/16/23 1419 *Not included in total MAR Hold   sodium chloride flush (NS) 0.9 % injection 3 mL (mL)  Total dose: Cannot be calculated* Dosing weight: 69.4 *Administration dose not documented Date/Time Rate/Dose/Volume Action   07/16/23 1419 *Not included in total MAR Hold   sodium  chloride flush (NS) 0.9 % injection 3 mL (mL)  Total dose: Cannot be calculated* Dosing weight: 69.4 *Administration dose not documented Date/Time Rate/Dose/Volume Action   07/16/23 1419 *Not included in total MAR Hold   enoxaparin (LOVENOX) injection 40 mg (mg)  Total dose: Cannot be calculated* Dosing weight: 69.4 *Administration dose not documented Date/Time Rate/Dose/Volume Action   07/16/23 1419 *Not included in total MAR Hold    Sedation Time  Sedation Time Physician-1: 33 minutes 6 seconds Radiation/Fluoro  Fluoro time: 6.2 (min) DAP: 8 (Gycm2) Cumulative Air Kerma: 145.3 (mGy) Complications  Complications documented before study signed (07/16/2023  3:23 PM)   No complications were associated with this study.  Documented by Frederica Kuster., RN - 07/16/2023  3:15 PM     Coronary Findings  Diagnostic Dominance: Right Left Main  Vessel is angiographically normal.    Left Anterior Descending  Vessel is angiographically normal.    Left Circumflex  Vessel is angiographically normal.    Right Coronary Artery  Vessel is angiographically normal.    Intervention   No interventions have been documented.   Left Heart  Left Ventricle The left ventricular ejection fraction is less than 25% by visual estimate.   Coronary Diagrams  Diagnostic Dominance: Right  Intervention   Implants   No implant documentation for this case.   Syngo Images   Show images for CARDIAC CATHETERIZATION Images on [employer] Storage   Show images for [place], [place] to Procedure Log  Procedure Log   Link to Procedure Log  Procedure Log    Hemo Data  Flowsheet Row Most Recent Value  Fick Cardiac Output 3.29 L/min  Fick Cardiac Output Index 1.85 (L/min)/BSA  RA A Wave 12 mmHg  RA V Wave 8 mmHg  RA Mean 7 mmHg  RV Systolic Pressure 43 mmHg  RV Diastolic Pressure 3 mmHg  RV EDP 12 mmHg  PA Systolic Pressure 42 mmHg  PA Diastolic Pressure 21 mmHg  PA Mean 30 mmHg   PW A Wave 23 mmHg  PW V Wave 24 mmHg  PW Mean 21 mmHg  AO Systolic Pressure 97 mmHg  AO Diastolic Pressure 70 mmHg  AO Mean 80 mmHg  LV Systolic Pressure 112 mmHg  LV Diastolic Pressure 1 mmHg  LV EDP 31 mmHg  AOp Systolic Pressure 94 mmHg  AOp Diastolic Pressure 78 mmHg  AOp Mean Pressure 84 mmHg  LVp Systolic Pressure 87 mmHg  LVp Diastolic Pressure 31 mmHg  LVp EDP Pressure 33 mmHg  QP/QS 1  TPVR Index 16.23 HRUI  TSVR Index 43.29 HRUI  PVR SVR Ratio 0.12  TPVR/TSVR Ratio 0.38      MR CARDIAC VELOCITY FLOW MAP Final result 07/17/2023 11:03 AM    Narrative  CLINICAL DATA:  Nonischemic cardiomyopathy  EXAM: CARDIAC MRI  TECHNIQUE: The patient was scanned on a 1.5 Tesla GE magnet. A dedicated cardiac coil was used. Functional imaging was done using Fiesta sequences. 2,3, and 4 chamber views were done to assess for RWMA's. Modified Simpson's rule using a short axis stack was used to ...       MR CARDIAC VELOCITY FLOW MAP Final result 07/17/2023 11:03 AM    Narrative  CLINICAL DATA:  Nonischemic cardiomyopathy  EXAM: CARDIAC MRI  TECHNIQUE: The patient was scanned on a 1.5 Tesla GE magnet. A dedicated cardiac coil was used. Functional imaging was done using Fiesta sequences. 2,3, and 4 chamber views were done to assess for RWMA's. Modified Simpson's rule using a short axis stack was used to ...       Study Result  Narrative & Impression  CLINICAL DATA:  Nonischemic cardiomyopathy   EXAM: CARDIAC MRI   TECHNIQUE: The patient was scanned on a 1.5 Tesla GE magnet. A dedicated cardiac coil was used. Functional imaging was done using Fiesta sequences. 2,3, and 4 chamber views were done to assess for RWMA's. Modified Simpson's rule using a short axis stack was used to calculate an ejection fraction on a dedicated work [employer]. The patient received 10 cc of Gadavist After 10 minutes inversion recovery sequences were used to assess  for infiltration and scar tissue.   FINDINGS: Limited images of the lung fields showed mild atelectasis at bases.   Small circumferential pericardial effusion. Severely dilated left ventricle with normal wall thickness. Global hypokinesis, EF 10%. No LV thrombus. Moderately dilated right ventricle with EF 17%. Moderate left atrial enlargement, mild right atrial enlargement. The aortic valve was not well-visualized. No significant aortic stenosis, mild regurgitation with regurgitant fraction 15%. Visually, mitral regurgitation looks moderate. Regurgitant fraction calculation did not appear accurate.   On delayed enhancement imaging, there was mid-wall late gadolinium enhancement (LGE) at the basal to mid inferoseptal RV insertion site.   MEASUREMENTS: MEASUREMENTS LVEDV 409 mL   LVEDVi 233 mL/m2 LVSV 40 mL   LVEF 10%   RVEDV 285 mL   RVEDVi 162 mL/m2   RVSV 50 mL RVEF 17%   Aortic regurgitant fraction 15%   T1 1108, ECV 33%   IMPRESSION: 1. Severely dilated LV with global hypokinesis, EF 10%. No LV thrombus.   2.  Moderately dilated RV with EF 17%.   3.  Visually moderate mitral regurgitation.   4. LGE at inferoseptal RV insertion site, this is nonspecific and seen with pressure/volume overload.   5. Mildly elevated extracellular volume percentage suggesting mildly elevated LV fibrotic content.   Dalton Mclean     Electronically Signed   By: Marca Ancona M.D.   On: 07/17/2023 17:32      ADDENDUM REPORT: 07/18/2023 14:11   ADDENDUM: Following should be added to the impression.   There are few small bilateral renal stones measuring up to 3 mm in size.  These results will be called to the ordering clinician or representative by the Radiologist Assistant, and communication documented in the PACS or [employer].     Electronically Signed   By: Ernie Avena M.D.   On: 07/18/2023 14:11    Addended by Ernie Avena, MD on  07/18/2023  2:14 PM    Study Result  Narrative & Impression  CLINICAL DATA:  LVAD evaluation   EXAM: CT CHEST, ABDOMEN AND PELVIS WITHOUT CONTRAST   TECHNIQUE: Multidetector CT imaging of the chest, abdomen and pelvis was performed following the standard protocol without IV contrast.   RADIATION DOSE REDUCTION: This exam was performed according to the departmental dose-optimization program which includes automated exposure control, adjustment of the mA and/or kV according to patient size and/or use of iterative reconstruction technique.   COMPARISON:  Chest radiographs done on 07/14/2023, CT abdomen and pelvis done on 04/29/2012   FINDINGS: CT CHEST FINDINGS   Cardiovascular: Heart is enlarged in size. Minimal pericardial effusion is seen. Tip of PICC line is seen in superior vena cava.   Mediastinum/Nodes: No significant lymphadenopathy is seen.   Lungs/Pleura: There are small linear densities in the lower lung fields suggesting scarring or subsegmental atelectasis. There is no focal pulmonary consolidation. There is no pleural effusion or pneumothorax.   Musculoskeletal: No acute findings are seen in the bony structures.   CT ABDOMEN PELVIS FINDINGS   Hepatobiliary: Surgical clips are seen in gallbladder fossa. Air in the lumen of the bile ducts may be due to previous sphincterotomy. There is no dilation of bile ducts.   Pancreas: No focal abnormalities are seen.   Spleen: Unremarkable.   Adrenals/Urinary Tract: Adrenals are unremarkable. There is no hydronephrosis. There are few small calcifications in both kidneys measuring up to 3 mm in size. Ureters are not dilated. Urinary bladder is unremarkable.   Stomach/Bowel: Stomach is unremarkable. Small bowel loops are not dilated. Appendix is not distinctly seen. There is no pericecal inflammation. There is no significant wall thickening in colon. Large amount of stool is seen in ascending and transverse  colon. There is no fecal impaction in rectum.   Vascular/Lymphatic: Unremarkable.   Reproductive: Small calcifications are seen in prostate.   Other: There is no ascites or pneumoperitoneum. Small umbilical hernia containing fat is seen.   Musculoskeletal: No acute findings are seen in bony structures.   IMPRESSION: Marked cardiomegaly.  Minimal pericardial effusion.   Small linear densities are seen in posterior lower lung fields suggesting scarring or subsegmental atelectasis. There is no focal pulmonary consolidation. There is no pleural effusion.   There is no evidence of intestinal obstruction or pneumoperitoneum. There is no hydronephrosis.   No significant lymphadenopathy is seen in chest, abdomen and pelvis. Bony structures are unremarkable.   Electronically Signed: By: Ernie Avena M.D. On: 07/18/2023 13:44       Narrative & Impression  CLINICAL DATA:  [id number] Preoperative evaluation to rule out surgical contraindication [id number]   EXAM: ORTHOPANTOGRAM/PANORAMIC   COMPARISON:  None Available.   FINDINGS: Multiple missing teeth, and restorations. Caries involving tooth number 32, with some periapical lucency. Mandible intact. TMJs seated.   IMPRESSION: Caries involving tooth number 32, with some periapical lucency.     Electronically Signed   By: Corlis Leak M.D.   On: 07/18/2023 13:39    This 37 year old gentleman presented with acute biventricular systolic heart failure with cardiogenic shock due to nonischemic cardiomyopathy.  His left ventricular ejection fraction is less than 10% with  a 7.4 cm left ventricular diastolic dimension.  The RV is moderately enlarged and has moderate systolic dysfunction with an ejection fraction of 17% on cardiac MRI.  He has improved symptomatically on milrinone and norepinephrine and is currently on 0.375 milrinone and 3 mcg norepinephrine with a Co-ox of 71%.  I think transplant would be ideal for this young  patient with biventricular systolic heart failure although he may not be a candidate at this time due to recent tobacco use.  I think he would be a reasonable candidate for LVAD therapy as a bridge to transplant although we would still have to be worried about his right ventricular function in the short-term and longer term.  If he is determined not to be a transplant candidate at this time then I think he should have LVAD therapy prior to discharge since he will likely not do well very long on current inotropic therapy with his severe biventricular dysfunction.  Assessment/Plan:  Dr. Gala Romney will discuss his case with [facility] transplantation and we will discuss his case at [facility] on Monday.   Alleen Borne 07/18/2023, 11:35 AM

## 2023-07-18 NOTE — Consult Note (Signed)
   Palliative Care Consult Note                                  Date: 07/18/2023   Patient Name: Joel York  DOB: 06/24/1986  MRN: [id number]  Age / Sex: 37 y.o., male  PCP: Pcp, No Referring Physician: Briant Cedar, MD  Reason for Consultation: {Reason for Consult:23484}  HPI/Patient Profile: 37 y.o. male  with past medical history of HTN, ADD, asthma, hx of drug abuse (now on suboxone for 5 years), and seizures. He was admitted on 07/14/2023 with acute systolic heart failure.   Palliative Medicine was consulted for goals of care in the setting of LVAD work-up.  Past Medical History:  Diagnosis Date   Acid reflux    ADHD (attention deficit hyperactivity disorder)    Asthma    Back pain    Seizures (HCC)    Resolved    Subjective:   I have reviewed medical records including EPIC notes, labs and imaging, received report from the team, and assessed the patient at bedside.   I met with *** to discuss diagnosis, prognosis, GOC, EOL wishes, disposition, and options.  I introduced Palliative Medicine as specialized medical care for people living with serious illness. It focuses on providing relief from the symptoms and stress of a serious illness.   We discussed patient's current illness and what it means in the larger context of his/her ongoing co-morbidities. Current clinical status was reviewed. Natural disease trajectory of *** was discussed.  Created space and opportunity for patient and family to explore thoughts and feelings regarding current medical situation.  Values and goals of care important to patient and family were attempted to be elicited.  A discussion was had today regarding advanced directives. Concepts specific to code status, artifical feeding and hydration, continued IV antibiotics and rehospitalization was had.  The MOST form was introduced and discussed.  Questions and concerns addressed. Patient/family  encouraged to call with questions or concerns.     Life Review: ***  Functional Status: ***  Patient/Family Understanding of Illness: ***  Patient Values: ***  Goals: ***  Additional Discussion: ***  Review of Systems  Objective:   Primary Diagnoses: Present on Admission: **None**   Physical Exam  Vital Signs:  BP (!) 113/90   Pulse (!) 118   Temp 98.5 F (36.9 C) (Oral)   Resp 17   Ht 5\' 7"  (1.702 m)   Wt 67.4 kg   SpO2 96%   BMI 23.27 kg/m   Palliative Assessment/Data: ***     Assessment & Plan:   SUMMARY OF RECOMMENDATIONS   ***  Primary Decision Maker: {Primary Decision OVFIE:33295}  Code Status/Advance Care Planning: {Palliative Code status:23503}  Symptom Management:  ***  Prognosis:  {Palliative Care Prognosis:23504}  Discharge Planning:  {Palliative dispostion:23505}   Discussed with: ***    Thank you for allowing [place] to participate in the care of Peter Minium   Time Total: ***  Greater than 50%  of this time was spent counseling and coordinating care related to the above assessment and plan.  Signed by: Sherlean Foot, NP Palliative Medicine Team  Team Phone # [phone]  For individual providers, please see AMION

## 2023-07-18 NOTE — Progress Notes (Signed)
Initial Nutrition Assessment  DOCUMENTATION CODES:   Not applicable  INTERVENTION:   Snacks TID between meals; pt prefers to eat 5-7 times per day  Allow double portions at meal times  Provided brief initial diet education on heart healthy diet, focusing on limiting salt and increasing fiber intake.   NUTRITION DIAGNOSIS:   Increased nutrient needs related to chronic illness as evidenced by estimated needs.  GOAL:   Patient will meet greater than or equal to 90% of their needs  MONITOR:   PO intake, Supplement acceptance, Weight trends, Labs  REASON FOR ASSESSMENT:   Consult LVAD Eval  ASSESSMENT:   37 yo male admitted with acute biventricular heart failure with cardiogenic shock. PMH includes HTN, hx drug abuse (now on suboxone x 5 years), ADD, asthma, seizures.  7/22 Admitted 7/25 RD consult for possible VAD  VAD workup underway. RV dysfunction is barrier but given recent tobacco use, pt not candidate for transplant ; VAD placement would be bridge to transplant  ECHO EF <20%  Recorded po intake 50-75% of meals. Pt reports good appetite at present but indicates portions at lunch and dinner do not always fill him up. Plan to allow double portions at meal times. Pt also indicates he tends to eat 5-7 smaller meals per day; plan to add scheduled snacks TID.   Pt reports prior to admission, pt with good appetite and po intake up until 1 week prior to admission when po intake limited by intermittent nausea. Pt reports he and his wife have already been monitoring sodium intake at home (estimates average intake around 2500 mg) and plan to continue to find ways to reduce sodium.  Pt denies any changes in weight other than weight fain from fluid. UBW around 140-150 pounds, feels best at 145 pounds. Pt does not way him self every day but does a few times a week. Encouraged pt to being weighing self daily, at same time each day, preferably first thing in AM and to monitor trends.    Current wt 67.4 kg; admit wt 67.2 kg. Lowest wt this admission: 63.5 kg  AKI, Creatinine 1.61, BUN wdl, SCr 1.02 in March (baseline)  Pt currently appears well-nourished; does not meet clinical characteristics for malnutrition but is at high nutritional risk.   Labs: sodium 134 (L), potassium 3.8 (wdl), Creatinine 1.61 (H), BUN wdl Meds: reviewed    NUTRITION - FOCUSED PHYSICAL EXAM:  Flowsheet Row Most Recent Value  Orbital Region No depletion  Upper Arm Region No depletion  Thoracic and Lumbar Region No depletion  Buccal Region No depletion  Temple Region No depletion  Clavicle Bone Region No depletion  Clavicle and Acromion Bone Region No depletion  Scapular Bone Region No depletion  Dorsal Hand No depletion  Patellar Region No depletion  Anterior Thigh Region No depletion  Posterior Calf Region No depletion  Edema (RD Assessment) Mild       Diet Order:   Diet Order             Diet Heart Room service appropriate? Yes; Fluid consistency: Thin  Diet effective now                   EDUCATION NEEDS:   Education needs have been addressed  Skin:  Skin Assessment: Reviewed RN Assessment  Last BM:  PTA, +flatus, abdomen soft, BS present  Height:   Ht Readings from Last 1 Encounters:  07/15/23 5\' 7"  (1.702 m)    Weight:   Wt Readings  from Last 1 Encounters:  07/18/23 67.4 kg     BMI:  Body mass index is 23.27 kg/m.  Estimated Nutritional Needs:   Kcal:  2100-2300 kcals  Protein:  110-125 g  Fluid:  1.8 L   Romelle Starcher MS, RDN, LDN, CNSC Registered Dietitian 3 Clinical Nutrition RD Pager and On-Call Pager Number Located in [place]

## 2023-07-18 NOTE — Progress Notes (Signed)
Brief MCS rounding note:   Met with Joselyn Glassman and Penni Bombard at bedside. They shared how helpful meeting with one of our current VAD pt's yesterday has helped with VAD decision process.    Provided brief equipment overview and demonstration with HeartMate III training loop including discussion on the following:   a) mobile power unit b) system controller   c) universal [employer] features editor   d) battery clips   e) Batteries   f)  Perc lock   g) Percutaneous lead   Dr Gala Romney discussed pt with [facility] team. At this time he is not a transplant candidate due to smoking. Will plan to present pt at [facility] on Monday afternoon. All questions answered at this time.   Alyce Pagan RN VAD Coordinator  Office: [phone]  24/7 Pager: [phone]

## 2023-07-18 NOTE — Progress Notes (Signed)
Advanced Heart Failure Rounding Note  PCP-Cardiologist: None   Subjective:    07/23: Initial CO-OX 30%. Started on milrinone 0.25 + NE.   cMRI:  LV markedly dilated EF 10% RV 17% NICM  On milrinone 0.375 and NE 3 Co-ox 71% Scr stable 1.6  Feeling better. Denies CP or SOB.    R/LHC 07/24: Findings:   On milrinone 0.25 and NE 2    Ao = 97/70 (80) LV = 112/31 RA = 7 RV = 43/12 PA = 42/21 (30) PCW = 21 (v = 25) Fick cardiac output/index = 3.3/1.9 PVR = 2.7 WU SVR = 1778 FA sat = 95% PA sat = 67%, 67% PAPi = 3.0   Assessment: 1. Severe NICM EF < 10% 2. Normal coronaries 3. Elevated filling pressures and low cardiac output despite milrinone/NE support    Objective:   Weight Range: 67.4 kg Body mass index is 23.27 kg/m.   Vital Signs:   Temp:  [98.2 F (36.8 C)-98.5 F (36.9 C)] 98.5 F (36.9 C) (07/25 2300) Pulse Rate:  [94-142] 99 (07/26 0630) Resp:  [8-17] 10 (07/26 0630) BP: (98-154)/(67-128) 106/75 (07/26 0500) SpO2:  [91 %-98 %] 94 % (07/26 0630) Weight:  [67.4 kg] 67.4 kg (07/26 0500) Last BM Date : 07/13/23  Weight change: Filed Weights   07/16/23 0500 07/17/23 0500 07/18/23 0500  Weight: 63.5 kg 65.3 kg 67.4 kg   Intake/Output:   Intake/Output Summary (Last 24 hours) at 07/18/2023 0719 Last data filed at 07/18/2023 0700 Gross per 24 hour  Intake 834.51 ml  Output 1900 ml  Net -1065.49 ml     Physical Exam   General:  Sitting up No resp difficulty HEENT: normal Neck: supple. no JVD. Carotids 2+ bilat; no bruits. No lymphadenopathy or thryomegaly appreciated. Cor: PMI laterally displaced.tachy reg +s3 Lungs: clear Abdomen: soft, nontender, nondistended. No hepatosplenomegaly. No bruits or masses. Good bowel sounds. Extremities: no cyanosis, clubbing, rash, edema Neuro: alert & orientedx3, cranial nerves grossly intact. moves all 4 extremities w/o difficulty. Affect pleasant   Telemetry   Sinus tach 90-110s Personally  reviewed  Labs    CBC Recent Labs    07/17/23 0426 07/18/23 0422  WBC 9.4 7.9  NEUTROABS 5.8 4.1  HGB 18.7* 17.1*  HCT 56.1* 51.7  MCV 87.0 90.1  PLT 291 255   Basic Metabolic Panel Recent Labs    74/25/95 0426 07/18/23 0422  NA 136 134*  K 3.5 3.8  CL 93* 94*  CO2 30 30  GLUCOSE 157* 224*  BUN 14 16  CREATININE 1.60* 1.61*  CALCIUM 8.4* 8.4*  MG 1.8 2.2   Liver Function Tests Recent Labs    07/17/23 0426 07/18/23 0422  AST 40 28  ALT 54* 41  ALKPHOS 121 101  BILITOT 1.6* 1.3*  PROT 6.3* 6.1*  ALBUMIN 3.4* 3.2*   No results for input(s): "LIPASE", "AMYLASE" in the last 72 hours. Cardiac Enzymes No results for input(s): "CKTOTAL", "CKMB", "CKMBINDEX", "TROPONINI" in the last 72 hours.  BNP: BNP (last 3 results) Recent Labs    07/14/23 0101  BNP 1,810.8*  2,494.6*    ProBNP (last 3 results) No results for input(s): "PROBNP" in the last 8760 hours.   D-Dimer No results for input(s): "DDIMER" in the last 72 hours.  Hemoglobin A1C No results for input(s): "HGBA1C" in the last 72 hours.  Fasting Lipid Panel No results for input(s): "CHOL", "HDL", "LDLCALC", "TRIG", "CHOLHDL", "LDLDIRECT" in the last 72 hours.  Thyroid Function  Tests No results for input(s): "TSH", "T4TOTAL", "T3FREE", "THYROIDAB" in the last 72 hours.  Invalid input(s): "FREET3"  Other results:  Imaging   MR CARDIAC VELOCITY FLOW MAP  Result Date: 07/17/2023 CLINICAL DATA:  Nonischemic cardiomyopathy EXAM: CARDIAC MRI TECHNIQUE: The patient was scanned on a 1.5 Tesla GE magnet. A dedicated cardiac coil was used. Functional imaging was done using Fiesta sequences. 2,3, and 4 chamber views were done to assess for RWMA's. Modified Simpson's rule using a short axis stack was used to calculate an ejection fraction on a dedicated work [employer] officer, [employer]. The patient received 10 cc of Gadavist After 10 minutes inversion recovery sequences were used to assess for  infiltration and scar tissue. FINDINGS: Limited images of the lung fields showed mild atelectasis at bases. Small circumferential pericardial effusion. Severely dilated left ventricle with normal wall thickness. Global hypokinesis, EF 10%. No LV thrombus. Moderately dilated right ventricle with EF 17%. Moderate left atrial enlargement, mild right atrial enlargement. The aortic valve was not well-visualized. No significant aortic stenosis, mild regurgitation with regurgitant fraction 15%. Visually, mitral regurgitation looks moderate. Regurgitant fraction calculation did not appear accurate. On delayed enhancement imaging, there was mid-wall late gadolinium enhancement (LGE) at the basal to mid inferoseptal RV insertion site. MEASUREMENTS: MEASUREMENTS LVEDV 409 mL LVEDVi 233 mL/m2 LVSV 40 mL LVEF 10% RVEDV 285 mL RVEDVi 162 mL/m2 RVSV 50 mL RVEF 17% Aortic regurgitant fraction 15% T1 1108, ECV 33% IMPRESSION: 1. Severely dilated LV with global hypokinesis, EF 10%. No LV thrombus. 2.  Moderately dilated RV with EF 17%. 3.  Visually moderate mitral regurgitation. 4. LGE at inferoseptal RV insertion site, this is nonspecific and seen with pressure/volume overload. 5. Mildly elevated extracellular volume percentage suggesting mildly elevated LV fibrotic content. Dalton Mclean Electronically Signed   By: Marca Ancona M.D.   On: 07/17/2023 17:32   MR CARDIAC VELOCITY FLOW MAP  Result Date: 07/17/2023 CLINICAL DATA:  Nonischemic cardiomyopathy EXAM: CARDIAC MRI TECHNIQUE: The patient was scanned on a 1.5 Tesla GE magnet. A dedicated cardiac coil was used. Functional imaging was done using Fiesta sequences. 2,3, and 4 chamber views were done to assess for RWMA's. Modified Simpson's rule using a short axis stack was used to calculate an ejection fraction on a dedicated work [employer] officer, trade [employer]. The patient received 10 cc of Gadavist After 10 minutes inversion recovery sequences were used to assess for  infiltration and scar tissue. FINDINGS: Limited images of the lung fields showed mild atelectasis at bases. Small circumferential pericardial effusion. Severely dilated left ventricle with normal wall thickness. Global hypokinesis, EF 10%. No LV thrombus. Moderately dilated right ventricle with EF 17%. Moderate left atrial enlargement, mild right atrial enlargement. The aortic valve was not well-visualized. No significant aortic stenosis, mild regurgitation with regurgitant fraction 15%. Visually, mitral regurgitation looks moderate. Regurgitant fraction calculation did not appear accurate. On delayed enhancement imaging, there was mid-wall late gadolinium enhancement (LGE) at the basal to mid inferoseptal RV insertion site. MEASUREMENTS: MEASUREMENTS LVEDV 409 mL LVEDVi 233 mL/m2 LVSV 40 mL LVEF 10% RVEDV 285 mL RVEDVi 162 mL/m2 RVSV 50 mL RVEF 17% Aortic regurgitant fraction 15% T1 1108, ECV 33% IMPRESSION: 1. Severely dilated LV with global hypokinesis, EF 10%. No LV thrombus. 2.  Moderately dilated RV with EF 17%. 3.  Visually moderate mitral regurgitation. 4. LGE at inferoseptal RV insertion site, this is nonspecific and seen with pressure/volume overload. 5. Mildly elevated extracellular volume percentage suggesting mildly elevated LV fibrotic content. Dalton  Mclean Electronically Signed   By: Marca Ancona M.D.   On: 07/17/2023 17:32   MR CARDIAC MORPHOLOGY W WO CONTRAST  Result Date: 07/17/2023 CLINICAL DATA:  Nonischemic cardiomyopathy EXAM: CARDIAC MRI TECHNIQUE: The patient was scanned on a 1.5 Tesla GE magnet. A dedicated cardiac coil was used. Functional imaging was done using Fiesta sequences. 2,3, and 4 chamber views were done to assess for RWMA's. Modified Simpson's rule using a short axis stack was used to calculate an ejection fraction on a dedicated work Research officer, trade [employer]. The patient received 10 cc of Gadavist After 10 minutes inversion recovery sequences were used to assess for  infiltration and scar tissue. FINDINGS: Limited images of the lung fields showed mild atelectasis at bases. Small circumferential pericardial effusion. Severely dilated left ventricle with normal wall thickness. Global hypokinesis, EF 10%. No LV thrombus. Moderately dilated right ventricle with EF 17%. Moderate left atrial enlargement, mild right atrial enlargement. The aortic valve was not well-visualized. No significant aortic stenosis, mild regurgitation with regurgitant fraction 15%. Visually, mitral regurgitation looks moderate. Regurgitant fraction calculation did not appear accurate. On delayed enhancement imaging, there was mid-wall late gadolinium enhancement (LGE) at the basal to mid inferoseptal RV insertion site. MEASUREMENTS: MEASUREMENTS LVEDV 409 mL LVEDVi 233 mL/m2 LVSV 40 mL LVEF 10% RVEDV 285 mL RVEDVi 162 mL/m2 RVSV 50 mL RVEF 17% Aortic regurgitant fraction 15% T1 1108, ECV 33% IMPRESSION: 1. Severely dilated LV with global hypokinesis, EF 10%. No LV thrombus. 2.  Moderately dilated RV with EF 17%. 3.  Visually moderate mitral regurgitation. 4. LGE at inferoseptal RV insertion site, this is nonspecific and seen with pressure/volume overload. 5. Mildly elevated extracellular volume percentage suggesting mildly elevated LV fibrotic content. Dalton Mclean Electronically Signed   By: Marca Ancona M.D.   On: 07/17/2023 17:32   Medications:   Scheduled Medications:  buprenorphine-naloxone  1 tablet Sublingual Daily   Chlorhexidine Gluconate Cloth  6 each Topical Daily   digoxin  0.125 mg Oral Daily   enoxaparin (LOVENOX) injection  40 mg Subcutaneous Q24H   ipratropium  1 spray Each Nare BID   loratadine  10 mg Oral QPM   mexiletine  200 mg Oral BID   mometasone-formoterol  2 puff Inhalation BID   montelukast  10 mg Oral q morning   potassium chloride  40 mEq Oral BID   sodium chloride flush  10-40 mL Intracatheter Q12H   sodium chloride flush  3 mL Intravenous Q12H   sodium chloride  flush  3 mL Intravenous Q12H   spironolactone  12.5 mg Oral Daily    Infusions:  sodium chloride     sodium chloride     milrinone 0.375 mcg/kg/min (07/18/23 0700)   norepinephrine (LEVOPHED) Adult infusion 3 mcg/min (07/18/23 0700)    PRN Medications: sodium chloride, sodium chloride, acetaminophen, acetaminophen, acetaminophen, alum & mag hydroxide-simeth, levalbuterol, nitroGLYCERIN, ondansetron (ZOFRAN) IV, mouth rinse, sodium chloride flush, sodium chloride flush, sodium chloride flush  Patient Profile   Joel York is a 37 y.o. male with HTN, ADD, asthma, hx drug abuse (now on suboxone x5 yrs), and seizures. Admitted with acute systolic heart failure.   Assessment/Plan  Acute biventricular systolic heart failure >> cardiogenic shock - Echo EF <20%,  RV mildly reduced, RV mod enlarged, estimated RV systolic pressure 53.50mmHg, LA mod dilated, RA mod dilated, mild-mod MR/TR - Etiology uncertain. ? 2/2 PVCs vs genetically mediated +/- hypertension.  - LHC with normal coronaries. RHC 07/24: Low CO (Fick CO/CI 3.3/1.9)  on milrinone 0.25 + NE 2 - cMRI:  LV markedly dilated EF 10% RV 17% NICM - Initial CO-OX 30%. Lactic acid 2.5>>cleared. CO-OX now 71% on milrinone 0.375 + NE 3 - Volume status ok  - Continue digoxin 0.125 - GDMT as tolerated - Ideally would pursue transplant workup but given tobacco use need to consider bridge with LVAD.  - VAD w/u underway. RV dysfunction will be a barrier but with recent tobacco use not candidate for transplant. I will d/w [facility] transplant today - Will need VAD prior to discharge  Hypertension - BPlow  Chest pressure - HsTrop 39>43>37>40, d/t demand ischemia - No CAD on cath  PVCs - high burden noted on tele - snores, will need eventual sleep study  - Suppressed with Mexiletine 200 mg BID - TSH 4.9, T4 0.79 - Keep K>4 and Mg >2  AKI - SCr 1.02 in March, baseline - Scr peaked at 1.9, improved to 1.60. Continue to support CO.  Tobacco  use - smokes 3-6 cigarettes / day - cessation discussed  Hx drug abuse - Continue suboxone  Elevated LFTs - In setting of low-output HF, improving  9. Iron deficiency - TSat 20 and ferritin 49, hgb 18.7 - could consider feraheme prior to discharge - only AFTER cMRI  10. Hypokalemia/hypomagnesemia - K 3.8 and mag 2.2 - Supp aggressivley  CRITICAL CARE Performed by: [name]  Total critical care time: 40 minutes  Critical care time was exclusive of separately billable procedures and treating other patients.  Critical care was necessary to treat or prevent imminent or life-threatening deterioration.  Critical care was time spent personally by me (independent of midlevel providers or residents) on the following activities: development of treatment plan with patient and/or surrogate as well as nursing, discussions with consultants, evaluation of patient's response to treatment, examination of patient, obtaining history from patient or surrogate, ordering and performing treatments and interventions, ordering and review of laboratory studies, ordering and review of radiographic studies, pulse oximetry and re-evaluation of patient's condition.   Length of Stay: 4  [name], MD  07/18/2023, 7:19 AM  Advanced Heart Failure Team Pager [phone] (M-F; 7a - 5p)  Please contact [facility] for night-coverage after hours (5p -7a ) and weekends on [email or web address]

## 2023-07-19 DIAGNOSIS — R570 Cardiogenic shock: Secondary | ICD-10-CM

## 2023-07-19 DIAGNOSIS — I5021 Acute systolic (congestive) heart failure: Secondary | ICD-10-CM | POA: Diagnosis not present

## 2023-07-19 DIAGNOSIS — I5082 Biventricular heart failure: Secondary | ICD-10-CM | POA: Diagnosis not present

## 2023-07-19 DIAGNOSIS — D509 Iron deficiency anemia, unspecified: Secondary | ICD-10-CM | POA: Diagnosis not present

## 2023-07-19 DIAGNOSIS — I110 Hypertensive heart disease with heart failure: Secondary | ICD-10-CM | POA: Diagnosis not present

## 2023-07-19 DIAGNOSIS — E876 Hypokalemia: Secondary | ICD-10-CM | POA: Diagnosis not present

## 2023-07-19 MED ORDER — FUROSEMIDE 40 MG PO TABS
40.0000 mg | ORAL_TABLET | Freq: Once | ORAL | Status: AC
  Administered 2023-07-19: 40 mg via ORAL
  Filled 2023-07-19: qty 1

## 2023-07-19 NOTE — Progress Notes (Signed)
Advanced Heart Failure Rounding Note  PCP-Cardiologist: None   Subjective:    07/23: Initial CO-OX 30%. Started on milrinone 0.25 + NE.   cMRI:  LV markedly dilated EF 10% RV 17% NICM  -No complaints this AM -Telemetry with sinus tachcyardia 110-130 -CVP 9-11 on milrinone 0.353mcg/kg/min & levophed , negative 1300 over the past 24h.  -sCr 1.4   R/LHC 07/24: Findings:   On milrinone 0.25 and NE 2    Ao = 97/70 (80) LV = 112/31 RA = 7 RV = 43/12 PA = 42/21 (30) PCW = 21 (v = 25) Fick cardiac output/index = 3.3/1.9 PVR = 2.7 WU SVR = 1778 FA sat = 95% PA sat = 67%, 67% PAPi = 3.0   Assessment: 1. Severe NICM EF < 10% 2. Normal coronaries 3. Elevated filling pressures and low cardiac output despite milrinone/NE support    Objective:   Weight Range: 67.2 kg Body mass index is 23.2 kg/m.   Vital Signs:   Temp:  [97.9 F (36.6 C)-98.6 F (37 C)] 97.9 F (36.6 C) (07/27 0800) Pulse Rate:  [93-138] 117 (07/27 1100) Resp:  [0-24] 14 (07/27 1100) BP: (80-116)/(48-99) 106/86 (07/27 0900) SpO2:  [90 %-98 %] 96 % (07/27 1100) Weight:  [67.2 kg] 67.2 kg (07/27 0447) Last BM Date :  (pta)  Weight change: Filed Weights   07/17/23 0500 07/18/23 0500 07/19/23 0447  Weight: 65.3 kg 67.4 kg 67.2 kg   Intake/Output:   Intake/Output Summary (Last 24 hours) at 07/19/2023 1314 Last data filed at 07/19/2023 1100 Gross per 24 hour  Intake 796.13 ml  Output 1825 ml  Net -1028.87 ml     Physical Exam  CVP 9-11 General:  Sitting up No resp difficulty HEENT: normal Neck: supple. no JVD. Carotids 2+ bilat; no bruits. No lymphadenopathy or thryomegaly appreciated. Cor: PMI laterally displaced.tachy reg +s3 Lungs: clear Abdomen: soft, nontender, nondistended. No hepatosplenomegaly. No bruits or masses. Good bowel sounds. Extremities: no cyanosis, clubbing, rash, edema Neuro: alert & orientedx3, cranial nerves grossly intact. moves all 4 extremities w/o  difficulty. Affect pleasant   Telemetry   Sinus tach 90-110s Personally reviewed  Labs    CBC Recent Labs    07/18/23 0422 07/19/23 0459  WBC 7.9 8.6  NEUTROABS 4.1 5.1  HGB 17.1* 16.6  HCT 51.7 50.9  MCV 90.1 89.5  PLT 255 250   Basic Metabolic Panel Recent Labs    24/40/10 0422 07/19/23 0459  NA 134* 135  K 3.8 5.2*  CL 94* 100  CO2 30 23  GLUCOSE 224* 172*  BUN 16 18  CREATININE 1.61* 1.41*  CALCIUM 8.4* 8.2*  MG 2.2 2.1   Liver Function Tests Recent Labs    07/18/23 0422 07/19/23 0459  AST 28 32  ALT 41 33  ALKPHOS 101 93  BILITOT 1.3* 0.9  PROT 6.1* 6.0*  ALBUMIN 3.2* 3.2*   No results for input(s): "LIPASE", "AMYLASE" in the last 72 hours. Cardiac Enzymes No results for input(s): "CKTOTAL", "CKMB", "CKMBINDEX", "TROPONINI" in the last 72 hours.  BNP: BNP (last 3 results) Recent Labs    07/14/23 0101  BNP 1,810.8*  2,494.6*    Other results:  Imaging   No results found. Medications:   Scheduled Medications:  buprenorphine-naloxone  1 tablet Sublingual Daily   Chlorhexidine Gluconate Cloth  6 each Topical Daily   digoxin  0.125 mg Oral Daily   enoxaparin (LOVENOX) injection  40 mg Subcutaneous Q24H   ipratropium  1 spray Each Nare BID   loratadine  10 mg Oral QPM   mexiletine  200 mg Oral BID   mometasone-formoterol  2 puff Inhalation BID   montelukast  10 mg Oral q morning   sodium chloride flush  10-40 mL Intracatheter Q12H   sodium chloride flush  3 mL Intravenous Q12H   sodium chloride flush  3 mL Intravenous Q12H    Infusions:  sodium chloride     sodium chloride     milrinone 0.375 mcg/kg/min (07/19/23 1100)   norepinephrine (LEVOPHED) Adult infusion 4 mcg/min (07/19/23 1100)    PRN Medications: sodium chloride, sodium chloride, acetaminophen, acetaminophen, acetaminophen, alum & mag hydroxide-simeth, levalbuterol, nitroGLYCERIN, ondansetron (ZOFRAN) IV, mouth rinse, sodium chloride flush, sodium chloride flush,  sodium chloride flush  Patient Profile   Joel York is a 37 y.o. male with HTN, ADD, asthma, hx drug abuse (now on suboxone x5 yrs), and seizures. Admitted with acute systolic heart failure.   Assessment/Plan  Acute biventricular systolic heart failure >> cardiogenic shock - Echo EF <20%,  RV mildly reduced, RV mod enlarged, estimated RV systolic pressure 53.21mmHg, LA mod dilated, RA mod dilated, mild-mod MR/TR - Etiology uncertain. ? 2/2 PVCs vs genetically mediated +/- hypertension.  - LHC with normal coronaries. RHC 07/24: Low CO (Fick CO/CI 3.3/1.9) on milrinone 0.25 + NE 2 - cMRI:  LV markedly dilated EF 10% RV 17% NICM - Initial CO-OX 30%. Lactic acid 2.5>>cleared.  - Continue digoxin 0.125 - Ideally would pursue transplant workup but given tobacco use need to consider bridge with LVAD.  - VAD w/u underway. RV dysfunction will be a barrier but with recent tobacco use not candidate for transplant. Discussed with [facility], at this time his only option is LVAD.  - Mixed venous 67% today with improvement in sCr to 1.4.  -Lasix 40mg  PO today.  -MRB on 7/29, plan for RHC on Tuesday to allow time for pre-operative optimization.  Hypertension - hypotensive on levophed.   Chest pressure - HsTrop 39>43>37>40, d/t demand ischemia - No CAD on cath  PVCs - high burden noted on tele - snores, will need eventual sleep study  - Suppressed with Mexiletine 200 mg BID - TSH 4.9, T4 0.79 - Keep K>4 and Mg >2  AKI - SCr 1.02 in March, baseline - improving.   Tobacco use - smokes 3-6 cigarettes / day - cessation discussed  Hx drug abuse - Continue suboxone  Elevated LFTs - In setting of low-output HF, improving  9. Iron deficiency - TSat 20 and ferritin 49, hgb 18.7   10. Hypokalemia/hypomagnesemia - K 3.8 and mag 2.2 - Hyperkalemic today; holding supplementation.   Joel York Advanced Heart Failure 1:19 PM  CRITICAL CARE Performed by: Dorthula Nettles   Total critical care time: 45 minutes  Critical care time was exclusive of separately billable procedures and treating other patients.  Critical care was necessary to treat or prevent imminent or life-threatening deterioration.  Critical care was time spent personally by me on the following activities: development of treatment plan with patient and/or surrogate as well as nursing, discussions with consultants, evaluation of patient's response to treatment, examination of patient, obtaining history from patient or surrogate, ordering and performing treatments and interventions, ordering and review of laboratory studies, ordering and review of radiographic studies, pulse oximetry and re-evaluation of patient's condition.

## 2023-07-20 DIAGNOSIS — I5082 Biventricular heart failure: Secondary | ICD-10-CM | POA: Diagnosis not present

## 2023-07-20 DIAGNOSIS — R570 Cardiogenic shock: Secondary | ICD-10-CM | POA: Diagnosis not present

## 2023-07-20 DIAGNOSIS — E876 Hypokalemia: Secondary | ICD-10-CM | POA: Diagnosis not present

## 2023-07-20 DIAGNOSIS — I5021 Acute systolic (congestive) heart failure: Secondary | ICD-10-CM | POA: Diagnosis not present

## 2023-07-20 DIAGNOSIS — D509 Iron deficiency anemia, unspecified: Secondary | ICD-10-CM | POA: Diagnosis not present

## 2023-07-20 DIAGNOSIS — I110 Hypertensive heart disease with heart failure: Secondary | ICD-10-CM | POA: Diagnosis not present

## 2023-07-20 LAB — BASIC METABOLIC PANEL WITH GFR
Anion gap: 13 (ref 5–15)
BUN: 19 mg/dL (ref 6–20)
CO2: 25 mmol/L (ref 22–32)
Calcium: 8.1 mg/dL — ABNORMAL LOW (ref 8.9–10.3)
Chloride: 95 mmol/L — ABNORMAL LOW (ref 98–111)
Creatinine, Ser: 1.44 mg/dL — ABNORMAL HIGH (ref 0.61–1.24)
GFR, Estimated: >60 mL/min (ref >60)
Glucose, Bld: 270 mg/dL — ABNORMAL HIGH (ref 70–99)
Potassium: 4.1 mmol/L (ref 3.5–5.1)
Sodium: 133 mmol/L — ABNORMAL LOW (ref 135–145)

## 2023-07-20 LAB — CBC WITH DIFFERENTIAL/PLATELET
Abs Immature Granulocytes: 0.02 10*3/uL (ref 0.00–0.07)
Basophils Absolute: 0.1 10*3/uL (ref 0.0–0.1)
Basophils Relative: 1 %
Eosinophils Absolute: 0.4 10*3/uL (ref 0.0–0.5)
Eosinophils Relative: 5 %
HCT: 52.3 % — ABNORMAL HIGH (ref 39.0–52.0)
Hemoglobin: 17 g/dL (ref 13.0–17.0)
Immature Granulocytes: 0 %
Lymphocytes Relative: 30 %
Lymphs Abs: 2.5 10*3/uL (ref 0.7–4.0)
MCH: 28.7 pg (ref 26.0–34.0)
MCHC: 32.5 g/dL (ref 30.0–36.0)
MCV: 88.3 fL (ref 80.0–100.0)
Monocytes Absolute: 0.7 10*3/uL (ref 0.1–1.0)
Monocytes Relative: 8 %
Neutro Abs: 4.5 10*3/uL (ref 1.7–7.7)
Neutrophils Relative %: 56 %
Platelets: 233 10*3/uL (ref 150–400)
RBC: 5.92 MIL/uL — ABNORMAL HIGH (ref 4.22–5.81)
RDW: 13.9 % (ref 11.5–15.5)
WBC: 8.1 10*3/uL (ref 4.0–10.5)
nRBC: 0 % (ref 0.0–0.2)

## 2023-07-20 LAB — MAGNESIUM: Magnesium: 1.7 mg/dL (ref 1.7–2.4)

## 2023-07-20 LAB — COOXEMETRY PANEL
Carboxyhemoglobin: 2.1 % — ABNORMAL HIGH (ref 0.5–1.5)
Methemoglobin: <0.7 % (ref 0.0–1.5)
O2 Saturation: 77.9 %
Total hemoglobin: 17.4 g/dL — ABNORMAL HIGH (ref 12.0–16.0)

## 2023-07-20 MED ORDER — MAGNESIUM SULFATE 2 GM/50ML IV SOLN
2.0000 g | Freq: Once | INTRAVENOUS | Status: AC
  Administered 2023-07-20: 2 g via INTRAVENOUS
  Filled 2023-07-20: qty 50

## 2023-07-20 NOTE — Progress Notes (Signed)
Advanced Heart Failure Rounding Note  PCP-Cardiologist: None   Subjective:    07/23: Initial CO-OX 30%. Started on milrinone 0.25 + NE.   cMRI:  LV markedly dilated EF 10% RV 17% NICM  -No complaints this AM -Telemetry with sinus tachycardia in low 100s -CVP respirophasic from 1-60mmHg   [facility] 07/24: Findings:   On milrinone 0.25 and NE 2    Ao = 97/70 (80) LV = 112/31 RA = 7 RV = 43/12 PA = 42/21 (30) PCW = 21 (v = 25) Fick cardiac output/index = 3.3/1.9 PVR = 2.7 WU SVR = 1778 FA sat = 95% PA sat = 67%, 67% PAPi = 3.0   Assessment: 1. Severe NICM EF < 10% 2. Normal coronaries 3. Elevated filling pressures and low cardiac output despite milrinone/NE support    Objective:   Weight Range: 67.7 kg Body mass index is 23.38 kg/m.   Vital Signs:   Temp:  [98.2 F (36.8 C)-98.3 F (36.8 C)] 98.3 F (36.8 C) (07/28 0845) Pulse Rate:  [87-260] 101 (07/28 0915) Resp:  [0-21] 11 (07/28 0915) BP: (98-130)/(49-97) 112/84 (07/28 0915) SpO2:  [87 %-97 %] 95 % (07/28 0915) Weight:  [67.7 kg] 67.7 kg (07/28 0600) Last BM Date : 07/19/23  Weight change: Filed Weights   07/18/23 0500 07/19/23 0447 07/20/23 0600  Weight: 67.4 kg 67.2 kg 67.7 kg   Intake/Output:   Intake/Output Summary (Last 24 hours) at 07/20/2023 1014 Last data filed at 07/20/2023 0900 Gross per 24 hour  Intake 878.45 ml  Output 2700 ml  Net -1821.55 ml     Physical Exam  CVP 2-5 General:  Sitting up No resp difficulty HEENT: normal Neck: supple. <5cm. Carotids 2+ bilat; no bruits. No lymphadenopathy or thryomegaly appreciated. Cor: PMI laterally displaced.tachy reg +s3 Lungs: CTA B/L Abdomen: soft, nontender, nondistended. No hepatosplenomegaly. No bruits or masses. Good bowel sounds. Extremities: no cyanosis, clubbing, rash, edema Neuro: alert & orientedx3, cranial nerves grossly intact. moves all 4 extremities w/o difficulty. Affect pleasant   Telemetry   Sinus tach 90-110s  Personally reviewed  Labs    CBC Recent Labs    07/19/23 0459 07/20/23 0621  WBC 8.6 8.1  NEUTROABS 5.1 4.5  HGB 16.6 17.0  HCT 50.9 52.3*  MCV 89.5 88.3  PLT 250 233   Basic Metabolic Panel Recent Labs    16/10/96 0459 07/20/23 0621  NA 135 133*  K 5.2* 4.1  CL 100 95*  CO2 23 25  GLUCOSE 172* 270*  BUN 18 19  CREATININE 1.41* 1.44*  CALCIUM 8.2* 8.1*  MG 2.1 1.7   Liver Function Tests Recent Labs    07/18/23 0422 07/19/23 0459  AST 28 32  ALT 41 33  ALKPHOS 101 93  BILITOT 1.3* 0.9  PROT 6.1* 6.0*  ALBUMIN 3.2* 3.2*   No results for input(s): "LIPASE", "AMYLASE" in the last 72 hours. Cardiac Enzymes No results for input(s): "CKTOTAL", "CKMB", "CKMBINDEX", "TROPONINI" in the last 72 hours.  BNP: BNP (last 3 results) Recent Labs    07/14/23 0101  BNP 1,810.8*  2,494.6*    Other results:  Imaging   No results found. Medications:   Scheduled Medications:  buprenorphine-naloxone  1 tablet Sublingual Daily   Chlorhexidine Gluconate Cloth  6 each Topical Daily   digoxin  0.125 mg Oral Daily   enoxaparin (LOVENOX) injection  40 mg Subcutaneous Q24H   ipratropium  1 spray Each Nare BID   loratadine  10 mg Oral  QPM   mexiletine  200 mg Oral BID   mometasone-formoterol  2 puff Inhalation BID   montelukast  10 mg Oral q morning   sodium chloride flush  10-40 mL Intracatheter Q12H   sodium chloride flush  3 mL Intravenous Q12H   sodium chloride flush  3 mL Intravenous Q12H    Infusions:  sodium chloride     sodium chloride     magnesium sulfate bolus IVPB     milrinone 0.375 mcg/kg/min (07/20/23 0900)   norepinephrine (LEVOPHED) Adult infusion 4 mcg/min (07/20/23 0900)    PRN Medications: sodium chloride, sodium chloride, acetaminophen, acetaminophen, acetaminophen, alum & mag hydroxide-simeth, levalbuterol, nitroGLYCERIN, ondansetron (ZOFRAN) IV, mouth rinse, sodium chloride flush, sodium chloride flush, sodium chloride flush  Patient  Profile   Joel York is a 37 y.o. male with HTN, ADD, asthma, hx drug abuse (now on suboxone x5 yrs), and seizures. Admitted with acute systolic heart failure.   Assessment/Plan  Acute biventricular systolic heart failure >> cardiogenic shock - Echo EF <20%,  RV mildly reduced, RV mod enlarged, estimated RV systolic pressure 53.46mmHg, LA mod dilated, RA mod dilated, mild-mod MR/TR - Etiology uncertain. ? 2/2 PVCs vs genetically mediated +/- hypertension.  - LHC with normal coronaries. RHC 07/24: Low CO (Fick CO/CI 3.3/1.9) on milrinone 0.25 + NE 2 - cMRI:  LV markedly dilated EF 10% RV 17% NICM - Initial CO-OX 30%. Lactic acid 2.5>>cleared.  - Continue digoxin 0.125 - Ideally would pursue transplant workup but given tobacco use need to consider bridge with LVAD.  - VAD w/u underway. RV dysfunction will be a barrier but with recent tobacco use not candidate for transplant. Discussed with [facility], at this time his only option is LVAD.  -MRB on 7/29, plan for RHC on Tuesday to allow time for pre-operative optimization. -sCr 1.44 today after PO lasix 40mg  yesterday with 2.7L urine output.  -CVP now 1-27mmHg; will hold off on diuretics today.   Hypertension - Stable, resolved.   Chest pressure - HsTrop 39>43>37>40, d/t demand ischemia - No CAD on cath  PVCs - high burden noted on tele - snores, will need eventual sleep study  - Suppressed with Mexiletine 200 mg BID - TSH 4.9, T4 0.79 - Keep K>4 and Mg >2  AKI - SCr 1.02 in March, baseline - stable  Tobacco use - smokes 3-6 cigarettes / day - cessation discussed  Hx drug abuse - Continue suboxone  Elevated LFTs - In setting of low-output HF, improving  9. Iron deficiency - TSat 20 and ferritin 49, hgb 18.7  10. Hypokalemia/hypomagnesemia - KCL today  Joel York Advanced Heart Failure 10:14 AM  CRITICAL CARE Performed by: Dorthula Nettles   Total critical care time: 40 minutes  Critical care  time was exclusive of separately billable procedures and treating other patients.  Critical care was necessary to treat or prevent imminent or life-threatening deterioration.  Critical care was time spent personally by me on the following activities: development of treatment plan with patient and/or surrogate as well as nursing, discussions with consultants, evaluation of patient's response to treatment, examination of patient, obtaining history from patient or surrogate, ordering and performing treatments and interventions, ordering and review of laboratory studies, ordering and review of radiographic studies, pulse oximetry and re-evaluation of patient's condition.

## 2023-07-20 NOTE — Plan of Care (Signed)
  Problem: Education: Goal: Knowledge of General Education information will improve Description: Including pain rating scale, medication(s)/side effects and non-pharmacologic comfort measures Outcome: Progressing   Problem: Health Behavior/Discharge Planning: Goal: Ability to manage health-related needs will improve Outcome: Progressing   Problem: Clinical Measurements: Goal: Ability to maintain clinical measurements within normal limits will improve Outcome: Progressing Goal: Will remain free from infection Outcome: Progressing Goal: Diagnostic test results will improve Outcome: Progressing Goal: Respiratory complications will improve Outcome: Progressing Goal: Cardiovascular complication will be avoided Outcome: Progressing   Problem: Activity: Goal: Risk for activity intolerance will decrease Outcome: Progressing   Problem: Nutrition: Goal: Adequate nutrition will be maintained Outcome: Progressing   Problem: Coping: Goal: Level of anxiety will decrease Outcome: Progressing   Problem: Elimination: Goal: Will not experience complications related to bowel motility Outcome: Progressing Goal: Will not experience complications related to urinary retention Outcome: Progressing   Problem: Pain Managment: Goal: General experience of comfort will improve Outcome: Progressing   Problem: Safety: Goal: Ability to remain free from injury will improve Outcome: Progressing   Problem: Skin Integrity: Goal: Risk for impaired skin integrity will decrease Outcome: Progressing   Problem: Education: Goal: Ability to demonstrate management of disease process will improve Outcome: Progressing Goal: Ability to verbalize understanding of medication therapies will improve Outcome: Progressing   Problem: Activity: Goal: Capacity to carry out activities will improve Outcome: Progressing   Problem: Cardiac: Goal: Ability to achieve and maintain adequate cardiopulmonary perfusion  will improve Outcome: Progressing   Problem: Activity: Goal: Ability to return to baseline activity level will improve Outcome: Progressing   Problem: Cardiovascular: Goal: Ability to achieve and maintain adequate cardiovascular perfusion will improve Outcome: Progressing Goal: Vascular access site(s) Level 0-1 will be maintained Outcome: Progressing   Problem: Health Behavior/Discharge Planning: Goal: Ability to safely manage health-related needs after discharge will improve Outcome: Progressing

## 2023-07-21 ENCOUNTER — Inpatient Hospital Stay (HOSPITAL_COMMUNITY): Payer: Medicaid Other

## 2023-07-21 ENCOUNTER — Encounter (HOSPITAL_COMMUNITY): Payer: Medicaid Other

## 2023-07-21 DIAGNOSIS — R570 Cardiogenic shock: Secondary | ICD-10-CM | POA: Diagnosis not present

## 2023-07-21 DIAGNOSIS — I5082 Biventricular heart failure: Secondary | ICD-10-CM | POA: Diagnosis not present

## 2023-07-21 DIAGNOSIS — Z515 Encounter for palliative care: Secondary | ICD-10-CM | POA: Diagnosis not present

## 2023-07-21 DIAGNOSIS — I509 Heart failure, unspecified: Secondary | ICD-10-CM | POA: Diagnosis not present

## 2023-07-21 LAB — ECHOCARDIOGRAM LIMITED
Est EF: <20
Height: 67 in
S' Lateral: 7.2 cm
Weight: 2447.99 oz

## 2023-07-21 MED ORDER — PERFLUTREN LIPID MICROSPHERE
1.0000 mL | INTRAVENOUS | Status: AC | PRN
  Administered 2023-07-21: 4 mL via INTRAVENOUS

## 2023-07-21 MED ORDER — SODIUM CHLORIDE 0.9 % IV SOLN: INTRAVENOUS | Status: DC

## 2023-07-21 MED ORDER — MAGNESIUM SULFATE 4 GM/100ML IV SOLN
4.0000 g | Freq: Once | INTRAVENOUS | Status: AC
  Administered 2023-07-21: 4 g via INTRAVENOUS
  Filled 2023-07-21: qty 100

## 2023-07-21 MED ORDER — POTASSIUM CHLORIDE CRYS ER 20 MEQ PO TBCR
40.0000 meq | EXTENDED_RELEASE_TABLET | Freq: Once | ORAL | Status: AC
  Administered 2023-07-21: 40 meq via ORAL
  Filled 2023-07-21: qty 2

## 2023-07-21 MED ORDER — FUROSEMIDE 40 MG PO TABS
40.0000 mg | ORAL_TABLET | Freq: Once | ORAL | Status: AC
  Administered 2023-07-21: 40 mg via ORAL
  Filled 2023-07-21: qty 1

## 2023-07-21 NOTE — Progress Notes (Signed)
CSW met with patient at bedside to discuss disability. Patient completed application and signed release of information for [facility]. Patient currently has no income although wife is working and able to support financial needs of family at this time. Lasandra Beech, LCSW, CCSW-MCS [phone]

## 2023-07-21 NOTE — Progress Notes (Signed)
Advanced Heart Failure Rounding Note  PCP-Cardiologist: None   Subjective:    07/23: Initial CO-OX 30%. Started on milrinone 0.25 + NE.  7/25: Milrinone increased to 0.375  cMRI:  LV markedly dilated EF 10% RV 17% NICM  CO-OX 66% on milrinone 0.375 + NE 4  Feeling well. No complaints.   CVP 5-6 with respiratory variation  Currently ST 100s-120s     R/LHC 07/24: Findings:   On milrinone 0.25 and NE 2    Ao = 97/70 (80) LV = 112/31 RA = 7 RV = 43/12 PA = 42/21 (30) PCW = 21 (v = 25) Fick cardiac output/index = 3.3/1.9 PVR = 2.7 WU SVR = 1778 FA sat = 95% PA sat = 67%, 67% PAPi = 3.0   Assessment: 1. Severe NICM EF < 10% 2. Normal coronaries 3. Elevated filling pressures and low cardiac output despite milrinone/NE support    Objective:   Weight Range: 69.4 kg Body mass index is 23.96 kg/m.   Vital Signs:   Temp:  [98.3 F (36.8 C)-98.6 F (37 C)] 98.6 F (37 C) (07/28 2015) Pulse Rate:  [86-131] 99 (07/29 0615) Resp:  [0-34] 11 (07/29 0615) BP: (80-122)/(54-105) 93/77 (07/29 0615) SpO2:  [90 %-98 %] 93 % (07/29 0615) Weight:  [69.4 kg] 69.4 kg (07/29 0545) Last BM Date : 07/19/23  Weight change: Filed Weights   07/19/23 0447 07/20/23 0600 07/21/23 0545  Weight: 67.2 kg 67.7 kg 69.4 kg   Intake/Output:   Intake/Output Summary (Last 24 hours) at 07/21/2023 0710 Last data filed at 07/21/2023 0600 Gross per 24 hour  Intake 927.81 ml  Output 1225 ml  Net -297.19 ml     Physical Exam  CVP 5-6 General:  Well appearing.  HEENT: normal Neck: supple. no JVD. Carotids 2+ bilat; no bruits. Cor: PMI nondisplaced. Regular rate & rhythm, tachy. No rubs, gallops or murmurs. Lungs: clear Abdomen: soft, nontender, nondistended.  Extremities: no cyanosis, clubbing, rash, edema, RUE PICC Neuro: alert & orientedx3. Affect pleasant    Telemetry   Sinus tach 100s-120s  Labs    CBC Recent Labs    07/20/23 0621 07/21/23 0559  WBC 8.1 8.5   NEUTROABS 4.5 4.5  HGB 17.0 16.4  HCT 52.3* 51.3  MCV 88.3 89.7  PLT 233 221   Basic Metabolic Panel Recent Labs    09/81/19 0621 07/21/23 0559  NA 133* 135  K 4.1 3.8  CL 95* 99  CO2 25 26  GLUCOSE 270* 243*  BUN 19 17  CREATININE 1.44* 1.24  CALCIUM 8.1* 7.9*  MG 1.7 1.8   Liver Function Tests Recent Labs    07/19/23 0459  AST 32  ALT 33  ALKPHOS 93  BILITOT 0.9  PROT 6.0*  ALBUMIN 3.2*   No results for input(s): "LIPASE", "AMYLASE" in the last 72 hours. Cardiac Enzymes No results for input(s): "CKTOTAL", "CKMB", "CKMBINDEX", "TROPONINI" in the last 72 hours.  BNP: BNP (last 3 results) Recent Labs    07/14/23 0101  BNP 1,810.8*  2,494.6*    Other results:  Imaging   No results found. Medications:   Scheduled Medications:  buprenorphine-naloxone  1 tablet Sublingual Daily   Chlorhexidine Gluconate Cloth  6 each Topical Daily   digoxin  0.125 mg Oral Daily   enoxaparin (LOVENOX) injection  40 mg Subcutaneous Q24H   ipratropium  1 spray Each Nare BID   loratadine  10 mg Oral QPM   mexiletine  200 mg Oral BID  mometasone-formoterol  2 puff Inhalation BID   montelukast  10 mg Oral q morning   sodium chloride flush  10-40 mL Intracatheter Q12H   sodium chloride flush  3 mL Intravenous Q12H   sodium chloride flush  3 mL Intravenous Q12H    Infusions:  sodium chloride     sodium chloride     milrinone 0.375 mcg/kg/min (07/21/23 0600)   norepinephrine (LEVOPHED) Adult infusion 4 mcg/min (07/21/23 0605)    PRN Medications: sodium chloride, sodium chloride, acetaminophen, acetaminophen, acetaminophen, alum & mag hydroxide-simeth, levalbuterol, nitroGLYCERIN, ondansetron (ZOFRAN) IV, mouth rinse, sodium chloride flush, sodium chloride flush, sodium chloride flush  Patient Profile   Joel York is a 37 y.o. male with HTN, ADD, asthma, hx drug abuse (now on suboxone x5 yrs), and seizures. Admitted with acute systolic heart failure.    Assessment/Plan  Acute biventricular systolic heart failure >> cardiogenic shock - Echo EF <20%,  RV mildly reduced, RV mod enlarged, estimated RV systolic pressure 53.52mmHg, LA mod dilated, RA mod dilated, mild-mod MR/TR - Etiology uncertain. ? 2/2 PVCs vs genetically mediated +/- hypertension.  - LHC with normal coronaries. RHC 07/24: Low CO (Fick CO/CI 3.3/1.9) on milrinone 0.25 + NE 2 - cMRI:  LV markedly dilated EF 10% RV 17% NICM - Initial CO-OX 30%. Lactic acid 2.5>>cleared.  - CO-OX stable on milrinone 0.375 + NE 4. Continue current inotrope support. - CVP 5-6. Give 40 mg po lasix today.  - Continue digoxin 0.125 - VAD w/u underway. RV dysfunction will be a barrier. Discussed with [facility], at this time his only option is LVAD due to tobacco use.  -MRB meeting this afternoon, plan for RHC on 07/30 to allow time for pre-operative optimization.  Hypertension - Has actually been hypotensive this admit. BP stable on current support.  Chest pressure - HsTrop 39>43>37>40, d/t demand ischemia - No CAD on cath  PVCs - high burden noted on tele - snores, will need eventual sleep study  - Suppressed with Mexiletine 200 mg BID - TSH 4.9, T4 0.79 - Keep K>4 and Mg >2  AKI - SCr 1.02 in March, up to 1.9 in setting of low-output - Scr now improved to 1.24.   Tobacco use - smokes 3-6 cigarettes / day - cessation discussed  Hx drug abuse - Continue suboxone  Elevated LFTs - In setting of low-output HF, improved  9. Hypokalemia/hypomagnesemia - K 3.8 and Mag 1.8 - Supp today    Anna Genre, PA-C Advanced Heart Failure 7:10 AM

## 2023-07-21 NOTE — Progress Notes (Signed)
Palliative Medicine Progress Note   Patient Name: Joel York       Date: 07/21/2023 DOB: August 14, 1986  Age: 37 y.o. MRN#: [id number] Attending Physician: Briant Cedar, MD Primary Care Physician: Aviva Kluver Admit Date: 07/14/2023   HPI/Patient Profile: 37 y.o. male  with past medical history of HTN, ADD, asthma, hx of drug abuse (now on suboxone for 5 years), and seizures. He was admitted on 07/14/2023 with acute systolic heart failure and cardiogenic shock.   Palliative Medicine was consulted for goals of care in the setting of LVAD work-up.  Subjective: Chart reviewed and patient assessed at bedside. He remains on milrinone and norepinephrine. No acute complaints.   I provided brief education on advanced directives, and left a blank AD packet at bedside for Joel York to review. I let him know I will return tomorrow to answer any questions and facilitate having these documents completed if he is ready to proceed.    Objective:  Physical Exam Vitals reviewed.  Constitutional:      General: He is not in acute distress. Pulmonary:     Effort: Pulmonary effort is normal.  Neurological:     Mental Status: He is alert and oriented to person, place, and time.             Vital Signs: BP 122/85   Pulse (!) 123   Temp 98.7 F (37.1 C) (Oral)   Resp 18   Ht 5\' 7"  (1.702 m)   Wt 69.4 kg   SpO2 94%   BMI 23.96 kg/m  SpO2: SpO2: 94 % O2 Device: O2 Device: Room Air O2 Flow Rate:       Palliative Medicine Assessment & Plan   Assessment: Principal Problem:   Acute decompensated heart failure (HCC) Active Problems:   AKI (acute kidney injury) (HCC)   Acute on chronic systolic CHF (congestive heart failure) (HCC)   Elevated troponin   Sinus tachycardia   PVC (premature  ventricular contraction)   History of asthma   Myocardial injury   Polycythemia   Chest pain   Transaminitis   Elevated bilirubin   CHF exacerbation (HCC)    Recommendations/Plan: Full scope care  Goal of care is medical stabilization; patient is hopeful for VAD placement Spiritual care referral to assist with advanced directives PMT will continue to follow  Code Status: Full code   Prognosis:  guarded  Discharge Planning: To Be Determined   Thank you for allowing the Palliative Medicine Team to assist in the care of this patient.   MDM - moderate   [name], NP   Please contact Palliative Medicine Team phone at [phone] for questions and concerns.  For individual providers, please see AMION.

## 2023-07-21 NOTE — Plan of Care (Signed)
  Problem: Education: Goal: Knowledge of General Education information will improve Description: Including pain rating scale, medication(s)/side effects and non-pharmacologic comfort measures Outcome: Progressing   Problem: Health Behavior/Discharge Planning: Goal: Ability to manage health-related needs will improve Outcome: Progressing   Problem: Clinical Measurements: Goal: Ability to maintain clinical measurements within normal limits will improve Outcome: Progressing Goal: Will remain free from infection Outcome: Progressing Goal: Diagnostic test results will improve Outcome: Progressing Goal: Respiratory complications will improve Outcome: Progressing Goal: Cardiovascular complication will be avoided Outcome: Progressing   Problem: Activity: Goal: Risk for activity intolerance will decrease Outcome: Progressing   Problem: Nutrition: Goal: Adequate nutrition will be maintained Outcome: Progressing   Problem: Coping: Goal: Level of anxiety will decrease Outcome: Progressing   Problem: Elimination: Goal: Will not experience complications related to bowel motility Outcome: Progressing Goal: Will not experience complications related to urinary retention Outcome: Progressing   Problem: Pain Managment: Goal: General experience of comfort will improve Outcome: Progressing   Problem: Safety: Goal: Ability to remain free from injury will improve Outcome: Progressing   Problem: Skin Integrity: Goal: Risk for impaired skin integrity will decrease Outcome: Progressing   Problem: Education: Goal: Ability to demonstrate management of disease process will improve Outcome: Progressing Goal: Ability to verbalize understanding of medication therapies will improve Outcome: Progressing   Problem: Activity: Goal: Capacity to carry out activities will improve Outcome: Progressing   Problem: Cardiac: Goal: Ability to achieve and maintain adequate cardiopulmonary perfusion  will improve Outcome: Progressing   Problem: Activity: Goal: Ability to return to baseline activity level will improve Outcome: Progressing   Problem: Cardiovascular: Goal: Ability to achieve and maintain adequate cardiovascular perfusion will improve Outcome: Progressing Goal: Vascular access site(s) Level 0-1 will be maintained Outcome: Progressing   Problem: Health Behavior/Discharge Planning: Goal: Ability to safely manage health-related needs after discharge will improve Outcome: Progressing

## 2023-07-21 NOTE — Progress Notes (Signed)
LVAD Initial Psychosocial Screening  Date/Time Initiated:  07-18-23 4:00 PM Referral Source:  Alyce Pagan, LVAD Coordinator Referral Reason:  LVAD Implantation Source of Information:  Pt., wife and chart review  Demographics Name:  Joel York Address:  [address] [place] [address] Cell: [phone] Marital Status:  Married  Faith:  Christianity Primary Language:  English DOB:  May 17, 1986  Medical & Follow-up Adherence to Medical regimen/INR checks: compliant  Medication adherence: compliant  Physician/Clinic Appointment Attendance: compliant   Advance Directives: Do you have a Living Will or Medical POA? No  Would you like to complete a Living Will and Medical POA prior to surgery?  Yes Do you have Goals of Care? Yes  Have you had a consult with the Palliative Care Team at [facility]? Yes  Psychological Health Appearance:  In hospital gown Mental Status:  Alert, oriented Eye Contact:  Good Thought Content:  Coherent Speech:  Logical/coherent Mood:  Appropriate and Pleasant  Affect:  Appropriate to circumstance Insight:  Good Judgement: Unimpaired Interaction Style:  Engaged  Family/Social Information Who lives in your home? Name:   Relationship:   Penni Bombard  Wife Hunter 4 yo  Son Melanie Crazier 3 months Son  Other family members/support persons in your life? Name:   Relationship:   Sterling Big  Mother Carle Laque Grandmother  Caregiving Needs Who is the primary caregiver? Penni Bombard- wife Health status:  good Do you drive?  yes Do you work?  yes Physical Limitations:  none Do you have other care giving responsibilities?  Young children in the home Contact number: [phone]  Who is the secondary caregiver? Robin-mother Health status:  good Do you drive?  yes Do you work?  yes Physical Limitations:  none Do you have other care giving responsibilities?  none Contact number: [phone]  Home Environment/Personal Care Do you have  reliable phone service? Yes - [employer] Do you own or rent your home? rent  Number of steps into the home? 2 steps How many levels in the home? 2 levels Assistive devices in the home? none Electrical needs for LVAD (3 prong outlets)? yes Second hand smoke exposure in the home? no Travel distance from [facility]? 20 minutes   Community Are you active with community agencies/resources/homecare? No  Are you active in a church, synagogue, mosque or other faith based community? No  What other sources do you have for spiritual support? Family Are you active in any clubs or social organizations? No What do you do for fun?  Hobbies?  Interests? Hunt, Fish, swimming and love to play with my kids.  Education/Work Information What is the last grade of school you completed? College Preferred method of learning?  Hands on Do you have any problems with reading or writing?  No Are you currently employed?  No  When were you last employed? Few weeks ago  Name of employer? 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 No Are you interested in job training or learning new skills?  No Did you serve in the military? No    Financial Information What is your source of income? Spouse's employment Do you have difficulty meeting your monthly expenses? No Can you budget for the monthly cost for dressing supplies post procedure? Yes   Primary Health insurance:  [facility] Managed Medicaid Secondary Insurance: n/a Prescription plan: Medicaid What are your  prescription co-pays? $4 Do you use mail order for your prescriptions?  No Have you ever had to refuse medication due to cost?  No Have you applied for Medicaid?  current Have you applied for Social Security Disability (SSI)  Plan to apply with [facility] Information Briefly describe why you are  here for evaluation: newly diagnosed Heart Failure during this admission. Do you have a PCP or other medical provider? Pcp, No Are you able to complete your ADL's? yes Do you have a history of trauma, physical, emotional, or sexual abuse? no Do you have any family history of heart problems? Grandmother Do you smoke now or past usage? past usage    Quit date: 2 weeks prior to admission Patient states he started as a teenager and smoked on and off Do you drink alcohol now or past usage? past usage   States he was a social drinker but has not had a drink in 3+ years.  Are you currently using illegal drugs or misuse of medication or past usage? past usage States he had a opioid addiction and has been clean for 7 years, Have you ever been treated for substance abuse? Yes      If yes, where and when did you receive treatment? Outpatient Treatment program in 2020 and transitioned to current monitoring and medication.  Mental Health History How have you been feeling in the past year? Declining and "feeling bad" Have you ever had any problems with depression, anxiety or other mental health issues? no Do you see a counselor, psychiatrist or therapist?  @ [facility] If you are currently experiencing problems are you interested in talking with a professional? No Have you or are you taking medications for anxiety/depression or any mental health concerns?  No  What are your coping strategies under stressful situations? "Try to see the good...everything happens for a reason." Are there any other stressors in your life? Kids Have you had any past or current thoughts of suicide? no How many hours do you sleep at night? 4-5 hours How is your appetite? Good Would you be interested in attending the LVAD support group? Yes  PHQ2 Depression Scale: 0  Legal Do you currently have any legal issues/problems?  No Have you had any legal issues/problems in the past?  no  Plan for VAD Implementation Do you  know and understand what happens during the VAD surgery? Patient Verbalizes Understanding  of surgery and able to describe details What do you know about the risks and side effect associated with VAD surgery? Patient Verbalizes Understanding  of risks (infection, stroke and death) Explain what will happen right after surgery: Patient Verbalizes Understanding  of OR to ICU and will be intubated What is your plan for transportation for the first 8 weeks post-surgery? (Patients are not recommended to drive post-surgery for 8 weeks)  Driver: Wife and Mother    Do you have airbags in your vehicle?  There is a risk of discharging the device if the airbag were to deploy. What do you know about your diet post-surgery? Patient Verbalizes Understanding  of Heart healthy How do you plan to monitor your medications, current and future?   Pill Bottles How do you plan to complete ADL's post-surgery?  Will it be difficult to ask for help from your caregivers?  No  Please explain what you hope will be improved about your life as a result of receiving the LVAD? Get my life back, strengthen my breathing and improve my  mentality. Please tell me your biggest concern or fear about living with the LVAD?  Not being able to do all the things that I could with a transplant. Please explain your understanding of how their body will change. Are you worried about these changes? "I don't know but looking at it as a second chance in life" Do you see any barriers to your surgery or follow-up? None  Understanding of LVAD Patient states understanding of the following: Surgical procedures and risks, Electrical need for LVAD (3 prong outlets), Safety precautions with LVAD (water, etc.), LVAD daily self-care (dressing changes, computer check, extra supplies), Outpatient follow up (LVAD clinic appts, monitoring blood thinners), and Need for Emergency Planning  Discussed and Reviewed with Patient and Caregiver  Patient's current  level of motivation to prepare for LVAD: Motivated Patient's present Level of Consent for LVAD: Ready  Education provided to patient/family/caregiver:   Caregiver role and responsibiltiy, Financial planning for LVAD, Role of Clinical Social Worker, and Signs of Depression and Anxiety   Caregiver questions Please explain what you hope will be improved about your life and loved one's life as a result of receiving the LVAD?  "Have him more present" What is your biggest concern or fear about caregiving with an LVAD patient?  Denies any fears What is your plan for availability to provide care 24/7 x2 weeks post op and dressing changes ongoing?  Flexible work schedule and family support Who is the relief/backup caregiver and what is their availability?   Mother Preferred method of learning? Hands on  Do you drive? yes How do you handle stressful situations?  "Don't like to talk about it just deal with it" Do you think you can do this? yes Is there anything that concerns about caregiving?  no Do you provide caregiving to anyone else? Young Engineer, agricultural current level of motivation to prepare for LVAD: Motivated Caregiver's present level of consent for LVAD: Ready  Clinical Interventions Needed:    CSW will monitor signs and symptoms of depression and assist with adjustment to life with an LVAD.  CSW will support and monitor ongoing outpatient treatment at [facility] related to past opioid addiction. CSW will discuss concerns shared about relapse due to need for pain medication post surgery with VAD Team during MRB. CSW will refer patient for Advanced Directives if not completed prior to surgery and still wishing to complete.  CSW will assist with Social Security Disability application. CSW encouraged attendance with the LVAD Support Group to assist further with adjustment and post implant peer support.  Clinical Impressions/Recommendations:   Eugine Godman is a 37 yo married male  with 2 young sons (ages 47 and 3 months). 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 He is currently not working with no income. Discussed disability and possible application.He does not have advanced directives although is very interested in completing. He states that his wife will be the primary caregiver and his mother will be the secondary.   He reports he has been compliant with medical follow up and shares that he is in treatment for opioid addiction through [facility]. He started an outpatient treatment program for opioid addiction in 2020 and has been on  Buprenorphine-naloxone with current follow up via video visits per his report. He shared concerns of a relapse with the need for pain medication post surgery. He states that he quit smoking tobacco 2 weeks ago and has been smoking since  he was a teenager. He denies any alcohol use and last time he drank was 3+ years ago. He scored a 0 on the PHQ2 and denies any mental health needs.   He denies any concerns with his home environment and compatibility with LVAD life. He enjoys hunting, fishing, swimming and his children. Discussed at length, the inability to swim or submerge in water post LVAD implantation. He denies any involvement with social clubs or organizations.   He verbalizes understanding of the surgery and the basics of life with an LVAD. He shares that his goal is to "get my life back and improve mentality". He did share that he is concerned about the inability to swim and hopeful for a transplant in the future.   Patient appears to be a good candidate for LVAD implantation. CSW will share concerns of pain medication with team and develop a plan to address post surgery and ongoing care in the outpatient.    Shane Crutch, CCSW-MCS [phone]

## 2023-07-22 ENCOUNTER — Encounter (HOSPITAL_COMMUNITY)
Admission: EM | Disposition: A | Discharge status: Home / Self Care | Payer: Self-pay | Source: Home / Self Care | Attending: Internal Medicine

## 2023-07-22 DIAGNOSIS — Z515 Encounter for palliative care: Secondary | ICD-10-CM | POA: Diagnosis not present

## 2023-07-22 DIAGNOSIS — I509 Heart failure, unspecified: Secondary | ICD-10-CM | POA: Diagnosis not present

## 2023-07-22 DIAGNOSIS — Z7189 Other specified counseling: Secondary | ICD-10-CM | POA: Diagnosis not present

## 2023-07-22 DIAGNOSIS — I5A Non-ischemic myocardial injury (non-traumatic): Secondary | ICD-10-CM

## 2023-07-22 DIAGNOSIS — I5021 Acute systolic (congestive) heart failure: Secondary | ICD-10-CM | POA: Diagnosis not present

## 2023-07-22 SURGERY — INVASIVE LAB ABORTED CASE
Anesthesia: LOCAL

## 2023-07-22 MED ORDER — MELATONIN 3 MG PO TABS
3.0000 mg | ORAL_TABLET | Freq: Every day | ORAL | Status: DC
  Administered 2023-07-23 – 2023-08-06 (×14): 3 mg via ORAL
  Filled 2023-07-22 (×14): qty 1

## 2023-07-22 MED ORDER — LACTULOSE 10 GM/15ML PO SOLN
20.0000 g | Freq: Three times a day (TID) | ORAL | Status: DC
  Administered 2023-07-22 – 2023-07-24 (×6): 20 g via ORAL
  Filled 2023-07-22 (×7): qty 30

## 2023-07-22 MED ORDER — SORBITOL 70 % SOLN
30.0000 mL | Freq: Once | Status: AC
  Administered 2023-07-22: 30 mL via ORAL
  Filled 2023-07-22: qty 30

## 2023-07-22 MED ORDER — LORAZEPAM 0.5 MG PO TABS
0.5000 mg | ORAL_TABLET | Freq: Every evening | ORAL | Status: DC | PRN
  Administered 2023-07-22 – 2023-08-02 (×7): 0.5 mg via ORAL
  Filled 2023-07-22 (×8): qty 1

## 2023-07-22 MED ORDER — FUROSEMIDE 10 MG/ML IJ SOLN
60.0000 mg | Freq: Once | INTRAMUSCULAR | Status: AC
  Administered 2023-07-22: 60 mg via INTRAVENOUS
  Filled 2023-07-22: qty 6

## 2023-07-22 MED ORDER — POTASSIUM CHLORIDE CRYS ER 20 MEQ PO TBCR
40.0000 meq | EXTENDED_RELEASE_TABLET | Freq: Two times a day (BID) | ORAL | Status: AC
  Administered 2023-07-22 (×2): 40 meq via ORAL
  Filled 2023-07-22 (×2): qty 2

## 2023-07-22 MED ORDER — POTASSIUM CHLORIDE CRYS ER 20 MEQ PO TBCR
40.0000 meq | EXTENDED_RELEASE_TABLET | Freq: Once | ORAL | Status: DC
  Administered 2023-07-22: 40 meq via ORAL
  Filled 2023-07-22: qty 2

## 2023-07-22 MED ORDER — CEFAZOLIN SODIUM-DEXTROSE 2-4 GM/100ML-% IV SOLN: 2.0000 g | INTRAVENOUS | Status: DC

## 2023-07-22 MED ORDER — HEPARIN (PORCINE) IN NACL 1000-0.9 UT/500ML-% IV SOLN
INTRAVENOUS | Status: DC | PRN
  Administered 2023-07-22: 500 mL

## 2023-07-22 SURGICAL SUPPLY — 4 items
CATH SWAN GANZ 7F STRAIGHT (CATHETERS) IMPLANT
GLIDESHEATH SLENDER 7FR .021G (SHEATH) IMPLANT
PACK CARDIAC CATHETERIZATION (CUSTOM PROCEDURE TRAY) ×1 IMPLANT
TRANSDUCER W/STOPCOCK (MISCELLANEOUS) ×1 IMPLANT

## 2023-07-22 NOTE — Progress Notes (Signed)
Day of Surgery Procedure(s) (LRB): INVASIVE LAB ABORTED CASE (N/A) Subjective: Patient discussed that mechanical support MRB yesterday and found to be appropriate candidate for implantable LVAD but at some increased risk due to RV dysfunction.  The patient has been evaluated by dental service after an abnormal orthopantogram and extractions are planned for tomorrow afternoon.  The patient will remain hospitalized on IV inotropes for HeartMate 3 implant to allow time for recovery after dental extractions.  Plan repeat right heart cath early next week before scheduled HeartMate 3 implant on Wednesday, August 7.  Objective: Vital signs in last 24 hours: Temp:  [97.8 F (36.6 C)-98.7 F (37.1 C)] 97.9 F (36.6 C) (07/30 0745) Pulse Rate:  [72-242] 242 (07/30 0845) Cardiac Rhythm: Normal sinus rhythm;Sinus tachycardia (07/30 0800) Resp:  [9-29] 16 (07/30 0845) BP: (83-149)/(47-111) 114/72 (07/30 0830) SpO2:  [86 %-99 %] 96 % (07/30 0845) Weight:  [69.2 kg] 69.2 kg (07/30 0500)  Hemodynamic parameters for last 24 hours: CVP:  [0 mmHg-11 mmHg] 11 mmHg  Intake/Output from previous day: 07/29 0701 - 07/30 0700 In: 1229.8 [P.O.:720; I.V.:409.3; IV Piggyback:100.6] Out: 4700 [Urine:4700] Intake/Output this shift: Total I/O In: 67.6 [I.V.:67.6] Out: 425 [Urine:425]  Exam Alert and comfortable Normal sinus rhythm minimal peripheral edema  Lab Results: Recent Labs    07/21/23 0559 07/22/23 0409  WBC 8.5 8.6  HGB 16.4 16.4  HCT 51.3 49.6  PLT 221 205   BMET:  Recent Labs    07/21/23 0559 07/22/23 0409  NA 135 134*  K 3.8 3.4*  CL 99 101  CO2 26 26  GLUCOSE 243* 183*  BUN 17 19  CREATININE 1.24 1.20  CALCIUM 7.9* 7.5*    PT/INR: No results for input(s): "LABPROT", "INR" in the last 72 hours. ABG    Component Value Date/Time   PHART 7.456 (H) 07/16/2023 1440   HCO3 28.4 (H) 07/16/2023 1447   HCO3 31.1 (H) 07/16/2023 1447   TCO2 30 07/16/2023 1447   TCO2 33 (H)  07/16/2023 1447   O2SAT 62.8 07/22/2023 0408   CBG (last 3)  No results for input(s): "GLUCAP" in the last 72 hours.  Assessment/Plan: S/P Procedure(s) (LRB): INVASIVE LAB ABORTED CASE (N/A) Dental extractions tomorrow Repeat right heart cath and HeartMate 3 implant next week  LOS: 8 days    Lovett Sox 07/22/2023

## 2023-07-22 NOTE — Progress Notes (Signed)
Palliative Medicine Progress Note   Patient Name: Joel York       Date: 07/22/2023 DOB: May 20, 1986  Age: 37 y.o. MRN#: [id number] Attending Physician: Briant Cedar, MD Primary Care Physician: Aviva Kluver Admit Date: 07/14/2023    HPI/Patient Profile: 37 y.o. male  with past medical history of HTN, ADD, asthma, hx of drug abuse (now on buprenorphine/Suboxone for 5 years), and seizures. He was admitted on 07/14/2023 with acute systolic heart failure and cardiogenic shock.   Palliative Medicine was consulted for goals of care in the setting of LVAD work-up.  Subjective: Chart reviewed. Note plan for dental extraction tomorrow.  Bedside visit. Patient reports difficulty sleeping, as well as some anxiety regarding his current medical situation.   Discussed need to have have a plan for post-op pain management, in the setting of substance abuse history (now on suboxone). Patient shares that remaining in recovery is a top priority for him.   Objective:  Physical Exam Vitals reviewed.  Constitutional:      General: He is not in acute distress. Pulmonary:     Effort: Pulmonary effort is normal.  Neurological:     Mental Status: He is alert and oriented to person, place, and time.  Psychiatric:        Behavior: Behavior normal.               Palliative Medicine Assessment & Plan   Assessment: Principal Problem:   Acute decompensated heart failure (HCC) Active Problems:   AKI (acute kidney injury) (HCC)   Acute on chronic systolic CHF (congestive heart failure) (HCC)   Elevated troponin   Sinus tachycardia   PVC (premature ventricular contraction)   History of asthma   Myocardial injury   Polycythemia   Chest pain   Transaminitis   Elevated bilirubin   CHF exacerbation  (HCC)    Recommendations/Plan: Full scope care Goal of care is medical stabilization; patient hopeful for VAD placement Appreciate Spiritual Care assistance with advanced directives Ativan 0.5 mg at bedtime prn sleep PMT will continue to follow  I reviewed current evidence-based guidelines on the use of buprenorphine and postoperative pain management.  Considerations for post LVAD implant pain management: Currently the role of buprenorphine in post-op pain management remains debated Stopping buprenorphine 2-3 days prior to surgery allows buprenorphine levels to decrease and  allows traditional opioids to be more effective Buprenorphine is a partial opioid agonist with a high affinity for the mu-opioid receptor and a long half-life. Therefore postop pain management typically includes discontinuing buprenorphine and switching to traditional opioids. However, in patients who are on a low dose of buprenorphine (2-8 mg per day), it can be continued and frequency increased to every 6-8 hours.    Code Status: Full code   Thank you for allowing the Palliative Medicine Team to assist in the care of this patient.   MDM - High   [name], NP   Please contact Palliative Medicine Team phone at [phone] for questions and concerns.  For individual providers, please see AMION.

## 2023-07-22 NOTE — Progress Notes (Signed)
Advanced Heart Failure Rounding Note  PCP-Cardiologist: None   Subjective:    07/23: Initial CO-OX 30%. Started on milrinone 0.25 + NE.  7/25: Milrinone increased to 0.375  cMRI:  LV markedly dilated EF 10% RV 17% NICM  CO-OX 63% on milrinone 0.375 + NE 2  CVP 10.   Feeling well. No complaints.     R/LHC 07/24: Findings:   On milrinone 0.25 and NE 2    Ao = 97/70 (80) LV = 112/31 RA = 7 RV = 43/12 PA = 42/21 (30) PCW = 21 (v = 25) Fick cardiac output/index = 3.3/1.9 PVR = 2.7 WU SVR = 1778 FA sat = 95% PA sat = 67%, 67% PAPi = 3.0   Assessment: 1. Severe NICM EF < 10% 2. Normal coronaries 3. Elevated filling pressures and low cardiac output despite milrinone/NE support    Objective:   Weight Range: 69.2 kg Body mass index is 23.89 kg/m.   Vital Signs:   Temp:  [97.8 F (36.6 C)-98.7 F (37.1 C)] 97.9 F (36.6 C) (07/30 0745) Pulse Rate:  [72-219] 86 (07/30 0600) Resp:  [8-29] 10 (07/30 0600) BP: (83-149)/(47-111) 83/62 (07/30 0600) SpO2:  [86 %-98 %] 96 % (07/30 0600) Weight:  [69.2 kg] 69.2 kg (07/30 0500) Last BM Date : 07/19/23  Weight change: Filed Weights   07/20/23 0600 07/21/23 0545 07/22/23 0500  Weight: 67.7 kg 69.4 kg 69.2 kg   Intake/Output:   Intake/Output Summary (Last 24 hours) at 07/22/2023 0820 Last data filed at 07/22/2023 0600 Gross per 24 hour  Intake 1207.3 ml  Output 4700 ml  Net -3492.7 ml     Physical Exam  CVP 10 General:  Well appearing.  HEENT: normal Neck: supple. JVP 10. Carotids 2+ bilat; no bruits.  Cor: PMI nondisplaced. Regular rhythm, tachy. No rubs, gallops or murmurs. Lungs: clear Abdomen: soft, nontender, nondistended.  Extremities: no cyanosis, clubbing, rash, edema, + RUE PICC Neuro: alert & oriented x 3. Affect pleasant     Telemetry   Sinus tach 100s-110s  Labs    CBC Recent Labs    07/21/23 0559 07/22/23 0409  WBC 8.5 8.6  NEUTROABS 4.5 3.8  HGB 16.4 16.4  HCT 51.3  49.6  MCV 89.7 90.8  PLT 221 205   Basic Metabolic Panel Recent Labs    57/84/69 0621 07/21/23 0559 07/22/23 0409  NA 133* 135 134*  K 4.1 3.8 3.4*  CL 95* 99 101  CO2 25 26 26   GLUCOSE 270* 243* 183*  BUN 19 17 19   CREATININE 1.44* 1.24 1.20  CALCIUM 8.1* 7.9* 7.5*  MG 1.7 1.8  --    Liver Function Tests No results for input(s): "AST", "ALT", "ALKPHOS", "BILITOT", "PROT", "ALBUMIN" in the last 72 hours.  No results for input(s): "LIPASE", "AMYLASE" in the last 72 hours. Cardiac Enzymes No results for input(s): "CKTOTAL", "CKMB", "CKMBINDEX", "TROPONINI" in the last 72 hours.  BNP: BNP (last 3 results) Recent Labs    07/14/23 0101  BNP 1,810.8*  2,494.6*    Other results:  Imaging   ECHOCARDIOGRAM LIMITED  Result Date: 07/21/2023    ECHOCARDIOGRAM LIMITED REPORT   Patient Name:   Joel York Date of Exam: 07/21/2023 Medical Rec #:  [id number]    Height:       67.0 in Accession #:    [id number]   Weight:       153.0 lb Date of Birth:  15-Jul-1986    BSA:  1.805 m Patient Age:    37 years     BP:           120/87 mmHg Patient Gender: M            HR:           99 bpm. Exam Location:  Inpatient Procedure: Limited Echo and Intracardiac Opacification Agent Indications:    CHF  History:        Patient has prior history of Echocardiogram examinations, most                 recent 07/14/2023.  Sonographer:    Harriette Bouillon Referring Phys: [phone] Adley Castello NICOLE Konstantina Nachreiner IMPRESSIONS  1. Left ventricular ejection fraction, by estimation, is <20%. The left ventricle has severely decreased function. The left ventricle demonstrates global hypokinesis. The left ventricular internal cavity size was severely dilated. No LV thrombus.  2. Limited echo, no doppler, only LV evaluation. FINDINGS  Left Ventricle: Left ventricular ejection fraction, by estimation, is <20%. The left ventricle has severely decreased function. The left ventricle demonstrates global hypokinesis. Definity contrast agent  was given IV to delineate the left ventricular endocardial borders. The left ventricular internal cavity size was severely dilated. There is no left ventricular hypertrophy. LEFT VENTRICLE PLAX 2D LVIDd:         7.50 cm LVIDs:         7.20 cm LV PW:         0.90 cm LV IVS:        0.90 cm  Dalton McleanMD Electronically signed by Wilfred Lacy Signature Date/Time: 07/21/2023/5:48:31 PM    Final    Medications:   Scheduled Medications:  buprenorphine-naloxone  1 tablet Sublingual Daily   Chlorhexidine Gluconate Cloth  6 each Topical Daily   digoxin  0.125 mg Oral Daily   enoxaparin (LOVENOX) injection  40 mg Subcutaneous Q24H   furosemide  60 mg Intravenous Once   ipratropium  1 spray Each Nare BID   loratadine  10 mg Oral QPM   mexiletine  200 mg Oral BID   mometasone-formoterol  2 puff Inhalation BID   montelukast  10 mg Oral q morning   potassium chloride  40 mEq Oral BID   sodium chloride flush  10-40 mL Intracatheter Q12H   sodium chloride flush  3 mL Intravenous Q12H   sodium chloride flush  3 mL Intravenous Q12H    Infusions:  sodium chloride     sodium chloride     sodium chloride 10 mL/hr at 07/22/23 0612   milrinone 0.375 mcg/kg/min (07/22/23 0600)   norepinephrine (LEVOPHED) Adult infusion 2 mcg/min (07/22/23 0600)    PRN Medications: sodium chloride, sodium chloride, acetaminophen, acetaminophen, acetaminophen, alum & mag hydroxide-simeth, levalbuterol, nitroGLYCERIN, ondansetron (ZOFRAN) IV, mouth rinse, sodium chloride flush, sodium chloride flush, sodium chloride flush  Patient Profile   Joel York is a 37 y.o. male with HTN, ADD, asthma, hx drug abuse (now on suboxone x5 yrs), and seizures. Admitted with acute systolic heart failure.   Assessment/Plan  Acute biventricular systolic heart failure >> cardiogenic shock - Echo EF <20%,  RV mildly reduced, RV mod enlarged, estimated RV systolic pressure 53.84mmHg, LA mod dilated, RA mod dilated, mild-mod MR/TR -  Etiology uncertain. ? 2/2 PVCs vs genetically mediated +/- hypertension.  - LHC with normal coronaries. RHC 07/24: Low CO (Fick CO/CI 3.3/1.9) and PAPi 3.0 on milrinone 0.25 + NE 2 - cMRI:  LV markedly dilated EF 10% RV 17% NICM -  Limited echo 07/29: LVEF < 20%, RV function may be slightly improved - Initial CO-OX 30%. Lactic acid 2.5>>cleared.  - CO-OX stable on milrinone 0.375 + NE 2. Continue current inotrope support. - CVP 10. Give 60 mg lasix IV today. Supp K.  - Continue digoxin 0.125 - VAD w/u underway. RV dysfunction will be a barrier. Discussed with [facility], at this time his only option is LVAD due to tobacco use.  -Plan for RHC today with possible Impella RP placement to optimize for VAD 08/01. -Need dental to see for dental caries/possible tooth abscess   Hypertension - Has actually been hypotensive this admit. BP stable on current support.  Chest pressure - HsTrop 39>43>37>40, d/t demand ischemia - No CAD on cath  PVCs - snores, will need eventual sleep study  - Suppressed with Mexiletine 200 mg BID - TSH 4.9, T4 0.79 - Keep K>4 and Mg >2  AKI - SCr 1.02 in March, up to 1.9 in setting of low-output - Scr now improved to 1.20.   Tobacco use - smokes 3-6 cigarettes / day - cessation discussed  Hx drug abuse - Continue suboxone  Elevated LFTs - In setting of low-output HF, improved  9. Hypokalemia/hypomagnesemia - K 3.4  - Supp K today - Check mag   Anna Genre, PA-C Advanced Heart Failure 8:20 AM

## 2023-07-22 NOTE — Plan of Care (Signed)
  Problem: Nutrition: Goal: Adequate nutrition will be maintained Outcome: Progressing   Problem: Coping: Goal: Level of anxiety will decrease Outcome: Progressing   Problem: Pain Managment: Goal: General experience of comfort will improve Outcome: Progressing   Problem: Safety: Goal: Ability to remain free from injury will improve Outcome: Progressing   Problem: Education: Goal: Ability to demonstrate management of disease process will improve Outcome: Progressing Goal: Ability to verbalize understanding of medication therapies will improve Outcome: Progressing   Problem: Activity: Goal: Capacity to carry out activities will improve Outcome: Progressing   Problem: Activity: Goal: Ability to return to baseline activity level will improve Outcome: Progressing   Problem: Cardiovascular: Goal: Ability to achieve and maintain adequate cardiovascular perfusion will improve Outcome: Progressing Goal: Vascular access site(s) Level 0-1 will be maintained Outcome: Progressing   Problem: Health Behavior/Discharge Planning: Goal: Ability to safely manage health-related needs after discharge will improve Outcome: Progressing

## 2023-07-22 NOTE — Progress Notes (Signed)
Brief MCS rounding note:  Spoke with Dr Barbette Merino this morning re: pantogram results. Dr Barbette Merino will plan to see pt this afternoon, and will plan for extractions to take place tomorrow.   VAD coordinator met with pt at bedside. Discussed plan for dental extractions tomorrow afternoon with Dr Barbette Merino. Discussed plan for VAD implant 07/30/23 if he still would like to proceed. He verbalized agreement, and that he would like to proceed with VAD implant and extractions.  Alyce Pagan RN VAD Coordinator  Office: [phone]  24/7 Pager: [phone]

## 2023-07-22 NOTE — Consult Note (Signed)
Reason for Consult:Pre-op dental for LVAD Referring Physician: VanTrigt  Joel York is an 37 y.o. male.  NG:EXBMWU pain about a month ago, none currently.     Past Medical History:  Diagnosis Date   Acid reflux    ADHD (attention deficit hyperactivity disorder)    Asthma    Back pain    Seizures (HCC)    Resolved    Past Surgical History:  Procedure Laterality Date   CHOLECYSTECTOMY     RIGHT/LEFT HEART CATH AND CORONARY ANGIOGRAPHY N/A 07/16/2023   Procedure: RIGHT/LEFT HEART CATH AND CORONARY ANGIOGRAPHY;  Surgeon: Dolores Patty, MD;  Location: [facility];  Service: Cardiovascular;  Laterality: N/A;   WISDOM TOOTH EXTRACTION      Family History  Problem Relation Age of Onset   Crohn's disease Maternal Grandmother    Crohn's disease Maternal Uncle    Hypertension Mother        Living   Congestive Heart Failure Maternal Grandmother    Hypertension Other        Maternal Aunts & Uncles    Social History:  reports that he has been smoking cigarettes. His smokeless tobacco use includes chew. He reports that he does not currently use alcohol. He reports that he does not currently use drugs.  Allergies:  Allergies  Allergen Reactions   Other Anaphylaxis    Tree Nuts   Peanuts [Peanut Oil] Anaphylaxis    Medications: I have reviewed the patient's current medications. Prior to Admission:  Medications Prior to Admission  Medication Sig Dispense Refill Last Dose   albuterol (PROVENTIL HFA;VENTOLIN HFA) 108 (90 Base) MCG/ACT inhaler Inhale 2 puffs into the lungs every 6 (six) hours as needed for wheezing or shortness of breath. Shortness of breath 1 Inhaler 0 07/13/2023   calcium carbonate (TUMS - DOSED IN MG ELEMENTAL CALCIUM) 500 MG chewable tablet Chew 1 tablet by mouth daily as needed for indigestion.   Past Week   fluticasone (FLONASE) 50 MCG/ACT nasal spray Place 1 spray into both nostrils daily. (Patient taking differently: Place 1 spray into both  nostrils daily as needed for allergies.) 15.8 mL 0 Past Week   Fluticasone-Salmeterol (ADVAIR) 100-50 MCG/DOSE AEPB Inhale 1 puff into the lungs every 12 (twelve) hours. (Patient taking differently: Inhale 1 puff into the lungs daily as needed (for shortness of breath).) 60 each 1 UNKNOWN   ipratropium (ATROVENT) 0.03 % nasal spray Place 2 sprays into both nostrils 2 (two) times daily. (Patient taking differently: Place 2 sprays into both nostrils 2 (two) times daily as needed for rhinitis.) 30 mL 0 Past Week   levocetirizine (XYZAL) 5 MG tablet Take 1 tablet (5 mg total) by mouth every evening. (Patient taking differently: Take 5 mg by mouth daily as needed for allergies.) 90 tablet 0 Past Week   ondansetron (ZOFRAN-ODT) 4 MG disintegrating tablet Take 1 tablet (4 mg total) by mouth every 8 (eight) hours as needed for nausea or vomiting. 20 tablet 0 Past Month   SUBOXONE 8-2 MG FILM Place 1 Film under the tongue daily.   07/13/2023 at am   azelastine (ASTELIN) 0.1 % nasal spray Place 1 spray into both nostrils 2 (two) times daily. Use in each nostril as directed (Patient not taking: Reported on 07/14/2023) 30 mL 0 Not Taking   Scheduled:  buprenorphine-naloxone  1 tablet Sublingual Daily   Chlorhexidine Gluconate Cloth  6 each Topical Daily   digoxin  0.125 mg Oral Daily   enoxaparin (LOVENOX) injection  40 mg Subcutaneous Q24H   ipratropium  1 spray Each Nare BID   lactulose  20 g Oral TID   loratadine  10 mg Oral QPM   mexiletine  200 mg Oral BID   mometasone-formoterol  2 puff Inhalation BID   montelukast  10 mg Oral q morning   potassium chloride  40 mEq Oral BID   sodium chloride flush  10-40 mL Intracatheter Q12H   sodium chloride flush  3 mL Intravenous Q12H   sodium chloride flush  3 mL Intravenous Q12H   Continuous:  sodium chloride     sodium chloride     milrinone 0.375 mcg/kg/min (07/22/23 1100)   norepinephrine (LEVOPHED) Adult infusion 2 mcg/min (07/22/23 0800)    Results  for orders placed or performed during the hospital encounter of 07/14/23 (from the past 48 hour(s))  Cooxemetry Panel (carboxy, met, total hgb, O2 sat)     Status: Abnormal   Collection Time: 07/21/23  5:58 AM  Result Value Ref Range   Total hemoglobin 16.9 (H) 12.0 - 16.0 g/dL   O2 Saturation 43.3 %   Carboxyhemoglobin 1.3 0.5 - 1.5 %   Methemoglobin <0.7 0.0 - 1.5 %    Comment: Performed at [facility], [address], [place], [place] [address]  CBC with Differential/Platelet     Status: None   Collection Time: 07/21/23  5:59 AM  Result Value Ref Range   WBC 8.5 4.0 - 10.5 K/uL   RBC 5.72 4.22 - 5.81 MIL/uL   Hemoglobin 16.4 13.0 - 17.0 g/dL   HCT 84.1 66.0 - 63.0 %   MCV 89.7 80.0 - 100.0 fL   MCH 28.7 26.0 - 34.0 pg   MCHC 32.0 30.0 - 36.0 g/dL   RDW 16.0 10.9 - 32.3 %   Platelets 221 150 - 400 K/uL   nRBC 0.0 0.0 - 0.2 %   Neutrophils Relative % 54 %   Neutro Abs 4.5 1.7 - 7.7 K/uL   Lymphocytes Relative 30 %   Lymphs Abs 2.6 0.7 - 4.0 K/uL   Monocytes Relative 9 %   Monocytes Absolute 0.8 0.1 - 1.0 K/uL   Eosinophils Relative 6 %   Eosinophils Absolute 0.5 0.0 - 0.5 K/uL   Basophils Relative 1 %   Basophils Absolute 0.1 0.0 - 0.1 K/uL   Immature Granulocytes 0 %   Abs Immature Granulocytes 0.03 0.00 - 0.07 K/uL    Comment: Performed at [facility], [address] [place], [place] [address]  Basic metabolic panel     Status: Abnormal   Collection Time: 07/21/23  5:59 AM  Result Value Ref Range   Sodium 135 135 - 145 mmol/L   Potassium 3.8 3.5 - 5.1 mmol/L   Chloride 99 98 - 111 mmol/L   CO2 26 22 - 32 mmol/L   Glucose, Bld 243 (H) 70 - 99 mg/dL    Comment: Glucose reference range applies only to samples taken after fasting for at least 8 hours.   BUN 17 6 - 20 mg/dL   Creatinine, Ser 2.02 0.61 - 1.24 mg/dL   Calcium 7.9 (L) 8.9 - 10.3 mg/dL   GFR, Estimated >54 >27 mL/min    Comment: (NOTE) Calculated using the CKD-EPI Creatinine Equation (2021)     Anion gap 10 5 - 15    Comment: Performed at [facility], [address] [place], [place] [address]  Magnesium     Status: None  Collection Time: 07/21/23  5:59 AM  Result Value Ref Range   Magnesium 1.8 1.7 - 2.4 mg/dL    Comment: Performed at [facility], [address] [place], [place] [address]  Cooxemetry Panel (carboxy, met, total hgb, O2 sat)     Status: Abnormal   Collection Time: 07/22/23  4:08 AM  Result Value Ref Range   Total hemoglobin 16.5 (H) 12.0 - 16.0 g/dL   O2 Saturation 01.0 %   Carboxyhemoglobin 1.2 0.5 - 1.5 %   Methemoglobin 0.9 0.0 - 1.5 %    Comment: Performed at [facility], [address] [place], [place] [address]  CBC with Differential/Platelet     Status: Abnormal   Collection Time: 07/22/23  4:09 AM  Result Value Ref Range   WBC 8.6 4.0 - 10.5 K/uL   RBC 5.46 4.22 - 5.81 MIL/uL   Hemoglobin 16.4 13.0 - 17.0 g/dL   HCT 66.4 40.3 - 47.4 %   MCV 90.8 80.0 - 100.0 fL   MCH 30.0 26.0 - 34.0 pg   MCHC 33.1 30.0 - 36.0 g/dL   RDW 25.9 56.3 - 87.5 %   Platelets 205 150 - 400 K/uL   nRBC 0.0 0.0 - 0.2 %   Neutrophils Relative % 44 %   Neutro Abs 3.8 1.7 - 7.7 K/uL   Lymphocytes Relative 38 %   Lymphs Abs 3.3 0.7 - 4.0 K/uL   Monocytes Relative 11 %   Monocytes Absolute 0.9 0.1 - 1.0 K/uL   Eosinophils Relative 6 %   Eosinophils Absolute 0.6 (H) 0.0 - 0.5 K/uL   Basophils Relative 1 %   Basophils Absolute 0.1 0.0 - 0.1 K/uL   Immature Granulocytes 0 %   Abs Immature Granulocytes 0.02 0.00 - 0.07 K/uL    Comment: Performed at [facility], [address] [place], [place] [address]  Basic metabolic panel     Status: Abnormal   Collection Time: 07/22/23  4:09 AM  Result Value Ref Range   Sodium 134 (L) 135 - 145 mmol/L   Potassium 3.4 (L) 3.5 - 5.1 mmol/L   Chloride 101 98 - 111 mmol/L   CO2 26 22 - 32 mmol/L   Glucose, Bld 183 (H) 70 - 99 mg/dL    Comment: Glucose reference range applies only to samples taken after  fasting for at least 8 hours.   BUN 19 6 - 20 mg/dL   Creatinine, Ser 9.51 0.61 - 1.24 mg/dL   Calcium 7.5 (L) 8.9 - 10.3 mg/dL   GFR, Estimated >88 >41 mL/min    Comment: (NOTE) Calculated using the CKD-EPI Creatinine Equation (2021)    Anion gap 7 5 - 15    Comment: Performed at [facility], [address], [place], [place] [address]    ABORTED INVASIVE LAB PROCEDURE  Result Date: 07/22/2023 This case was aborted.  ECHOCARDIOGRAM LIMITED  Result Date: 07/21/2023    ECHOCARDIOGRAM LIMITED REPORT   Patient Name:   Joel York Date of Exam: 07/21/2023 Medical Rec #:  [id number]    Height:       67.0 in Accession #:    [id number]   Weight:       153.0 lb Date of Birth:  05-29-1986    BSA:          1.805 m Patient Age:    37 years     BP:  120/87 mmHg Patient Gender: M            HR:           99 bpm. Exam Location:  Inpatient Procedure: Limited Echo and Intracardiac Opacification Agent Indications:    CHF  History:        Patient has prior history of Echocardiogram examinations, most                 recent 07/14/2023.  Sonographer:    Harriette Bouillon Referring Phys: [phone] LINDSAY NICOLE [name] IMPRESSIONS  1. Left ventricular ejection fraction, by estimation, is <20%. The left ventricle has severely decreased function. The left ventricle demonstrates global hypokinesis. The left ventricular internal cavity size was severely dilated. No LV thrombus.  2. Limited echo, no doppler, only LV evaluation. FINDINGS  Left Ventricle: Left ventricular ejection fraction, by estimation, is <20%. The left ventricle has severely decreased function. The left ventricle demonstrates global hypokinesis. Definity contrast agent was given IV to delineate the left ventricular endocardial borders. The left ventricular internal cavity size was severely dilated. There is no left ventricular hypertrophy. LEFT VENTRICLE PLAX 2D LVIDd:         7.50 cm LVIDs:         7.20 cm LV PW:         0.90 cm LV IVS:        0.90 cm   Dalton McleanMD Electronically signed by Wilfred Lacy Signature Date/Time: 07/21/2023/5:48:31 PM    Final     ROS Blood pressure 114/72, pulse (!) 242, temperature 97.9 F (36.6 C), temperature source Oral, resp. rate 16, height 5\' 7"  (1.702 m), weight 69.2 kg, SpO2 96%. General appearance: alert, cooperative, and no distress Head: Normocephalic, without obvious abnormality, atraumatic Eyes: negative Nose: Nares normal. Septum midline. Mucosa normal. No drainage or sinus tenderness. Throat: Dental caries with percussion tenderness teeth # 2, 15, 32. Neck: no adenopathy  Assessment/Plan: Non-restorable teeth # 2, 15, 32. Surgery in am tomorrow.  Ocie Doyne 07/22/2023, 11:34 AM

## 2023-07-22 NOTE — Progress Notes (Signed)
Brief Nutrition Follow-up:  Noted pt now requiring dental extractions which is scheduled for tomorrow. Noted implantation of HM3 on Wednesday, August 7  Pt has been eating well since last visit. Recorded po intake 50-100% of meals, pt is allowed double portions and receiving snacks between meals.   Last BM 7/27; noted lactulose started today and received sorbitol x 1 Labs and Meds reviewed  Post dental extraction, if po intake negatively impacted due to difficulty chewing and/or pain, recommend adding Ensure Enlive to continue to optimize prior to surgery. Plan to continue snacks between meals for now and allow double portions.   Joel Starcher MS, RDN, LDN, CNSC Registered Dietitian 3 Clinical Nutrition RD Pager and On-Call Pager Number Located in [place]

## 2023-07-23 ENCOUNTER — Encounter (HOSPITAL_COMMUNITY): Payer: Self-pay | Admitting: Internal Medicine

## 2023-07-23 ENCOUNTER — Ambulatory Visit: Admit: 2023-07-23 | Payer: Medicaid Other | Admitting: Oral Surgery

## 2023-07-23 ENCOUNTER — Inpatient Hospital Stay (HOSPITAL_COMMUNITY): Payer: Medicaid Other | Admitting: Anesthesiology

## 2023-07-23 ENCOUNTER — Encounter (HOSPITAL_COMMUNITY)
Admission: EM | Disposition: A | Discharge status: Home / Self Care | Payer: Self-pay | Source: Home / Self Care | Attending: Internal Medicine

## 2023-07-23 ENCOUNTER — Ambulatory Visit: Payer: Self-pay | Admitting: Nurse Practitioner

## 2023-07-23 DIAGNOSIS — I110 Hypertensive heart disease with heart failure: Secondary | ICD-10-CM | POA: Diagnosis not present

## 2023-07-23 DIAGNOSIS — I5021 Acute systolic (congestive) heart failure: Secondary | ICD-10-CM | POA: Diagnosis not present

## 2023-07-23 DIAGNOSIS — K0850 Unsatisfactory restoration of tooth, unspecified: Secondary | ICD-10-CM | POA: Diagnosis not present

## 2023-07-23 DIAGNOSIS — I5023 Acute on chronic systolic (congestive) heart failure: Secondary | ICD-10-CM

## 2023-07-23 DIAGNOSIS — J4530 Mild persistent asthma, uncomplicated: Secondary | ICD-10-CM

## 2023-07-23 DIAGNOSIS — I509 Heart failure, unspecified: Secondary | ICD-10-CM | POA: Diagnosis not present

## 2023-07-23 DIAGNOSIS — F17210 Nicotine dependence, cigarettes, uncomplicated: Secondary | ICD-10-CM | POA: Diagnosis not present

## 2023-07-23 HISTORY — PX: TOOTH EXTRACTION: SHX859

## 2023-07-23 LAB — SURGICAL PCR SCREEN
MRSA, PCR: NEGATIVE
Staphylococcus aureus: POSITIVE — AB

## 2023-07-23 SURGERY — DENTAL RESTORATION/EXTRACTIONS
Anesthesia: General | Site: Mouth

## 2023-07-23 MED ORDER — FUROSEMIDE 40 MG PO TABS
40.0000 mg | ORAL_TABLET | Freq: Every day | ORAL | Status: DC
  Administered 2023-07-23 – 2023-07-24 (×2): 40 mg via ORAL
  Filled 2023-07-23 (×2): qty 1

## 2023-07-23 MED ORDER — EPINEPHRINE 1 MG/10ML IJ SOSY
PREFILLED_SYRINGE | INTRAMUSCULAR | Status: DC | PRN
  Administered 2023-07-23 (×2): 100 ug via INTRAVENOUS

## 2023-07-23 MED ORDER — FENTANYL CITRATE PF 50 MCG/ML IJ SOSY
25.0000 ug | PREFILLED_SYRINGE | INTRAMUSCULAR | Status: AC | PRN
  Administered 2023-07-23: 25 ug via INTRAVENOUS
  Administered 2023-07-23 – 2023-07-31 (×2): 50 ug via INTRAVENOUS
  Filled 2023-07-23 (×3): qty 1

## 2023-07-23 MED ORDER — SUCCINYLCHOLINE CHLORIDE 200 MG/10ML IV SOSY
PREFILLED_SYRINGE | INTRAVENOUS | Status: DC | PRN
  Administered 2023-07-23: 140 mg via INTRAVENOUS

## 2023-07-23 MED ORDER — LIDOCAINE 2% (20 MG/ML) 5 ML SYRINGE
INTRAMUSCULAR | Status: AC
  Filled 2023-07-23: qty 5

## 2023-07-23 MED ORDER — POTASSIUM CHLORIDE CRYS ER 20 MEQ PO TBCR
40.0000 meq | EXTENDED_RELEASE_TABLET | Freq: Once | ORAL | Status: AC
  Administered 2023-07-23: 40 meq via ORAL
  Filled 2023-07-23: qty 2

## 2023-07-23 MED ORDER — VASOPRESSIN 20 UNIT/ML IV SOLN
INTRAVENOUS | Status: DC | PRN
Start: 2023-07-23 — End: 2023-07-23
  Administered 2023-07-23 (×4): 4 [IU] via INTRAVENOUS

## 2023-07-23 MED ORDER — ONDANSETRON HCL 4 MG/2ML IJ SOLN
INTRAMUSCULAR | Status: AC
  Filled 2023-07-23: qty 2

## 2023-07-23 MED ORDER — ROCURONIUM BROMIDE 10 MG/ML (PF) SYRINGE
PREFILLED_SYRINGE | INTRAVENOUS | Status: AC
  Filled 2023-07-23: qty 10

## 2023-07-23 MED ORDER — HEMOSTATIC AGENTS (NO CHARGE) OPTIME
TOPICAL | Status: DC | PRN
  Administered 2023-07-23: 1 via TOPICAL

## 2023-07-23 MED ORDER — LIDOCAINE-EPINEPHRINE 2 %-1:100000 IJ SOLN
INTRAMUSCULAR | Status: DC | PRN
  Administered 2023-07-23: .01 mL via INTRADERMAL

## 2023-07-23 MED ORDER — LIDOCAINE 2% (20 MG/ML) 5 ML SYRINGE
INTRAMUSCULAR | Status: DC | PRN
  Administered 2023-07-23: 60 mg via INTRAVENOUS

## 2023-07-23 MED ORDER — SODIUM CHLORIDE 0.9 % IV SOLN
3.0000 g | Freq: Once | INTRAVENOUS | Status: AC
  Administered 2023-07-23: 3 g via INTRAVENOUS
  Filled 2023-07-23: qty 8

## 2023-07-23 MED ORDER — BUPIVACAINE-EPINEPHRINE (PF) 0.25% -1:200000 IJ SOLN
INTRAMUSCULAR | Status: AC
  Filled 2023-07-23: qty 30

## 2023-07-23 MED ORDER — FENTANYL CITRATE (PF) 250 MCG/5ML IJ SOLN
INTRAMUSCULAR | Status: AC
  Filled 2023-07-23: qty 5

## 2023-07-23 MED ORDER — ENOXAPARIN SODIUM 40 MG/0.4ML IJ SOSY
40.0000 mg | PREFILLED_SYRINGE | INTRAMUSCULAR | Status: DC
  Administered 2023-07-24 – 2023-07-29 (×6): 40 mg via SUBCUTANEOUS
  Filled 2023-07-23 (×6): qty 0.4

## 2023-07-23 MED ORDER — FENTANYL CITRATE PF 50 MCG/ML IJ SOSY: 25.0000 ug | PREFILLED_SYRINGE | INTRAMUSCULAR | Status: DC | PRN

## 2023-07-23 MED ORDER — MUPIROCIN 2 % EX OINT
1.0000 | TOPICAL_OINTMENT | Freq: Two times a day (BID) | CUTANEOUS | Status: AC
  Administered 2023-07-23 – 2023-07-27 (×10): 1 via NASAL
  Filled 2023-07-23: qty 22

## 2023-07-23 MED ORDER — ORAL CARE MOUTH RINSE: 15.0000 mL | OROMUCOSAL | Status: DC | PRN

## 2023-07-23 MED ORDER — SODIUM CHLORIDE 0.9 % IR SOLN
Status: DC | PRN
  Administered 2023-07-23: 1000 mL

## 2023-07-23 MED ORDER — DEXAMETHASONE SODIUM PHOSPHATE 10 MG/ML IJ SOLN
INTRAMUSCULAR | Status: AC
  Filled 2023-07-23: qty 1

## 2023-07-23 MED ORDER — LACTATED RINGERS IV SOLN: INTRAVENOUS | Status: DC | PRN

## 2023-07-23 MED ORDER — MORPHINE SULFATE (PF) 2 MG/ML IV SOLN: 1.0000 mg | INTRAVENOUS | Status: DC | PRN

## 2023-07-23 MED ORDER — 0.9 % SODIUM CHLORIDE (POUR BTL) OPTIME
TOPICAL | Status: DC | PRN
  Administered 2023-07-23: 1000 mL

## 2023-07-23 MED ORDER — ETOMIDATE 2 MG/ML IV SOLN
INTRAVENOUS | Status: DC | PRN
  Administered 2023-07-23: 12 mg via INTRAVENOUS

## 2023-07-23 MED ORDER — MIDAZOLAM HCL 2 MG/2ML IJ SOLN
INTRAMUSCULAR | Status: DC | PRN
  Administered 2023-07-23: 2 mg via INTRAVENOUS

## 2023-07-23 MED ORDER — BUPIVACAINE-EPINEPHRINE 0.25% -1:200000 IJ SOLN
INTRAMUSCULAR | Status: DC | PRN
  Administered 2023-07-23: 9 mL

## 2023-07-23 MED ORDER — LIDOCAINE-EPINEPHRINE 2 %-1:100000 IJ SOLN
INTRAMUSCULAR | Status: AC
  Filled 2023-07-23: qty 1

## 2023-07-23 MED ORDER — SODIUM CHLORIDE 0.9 % IV SOLN
3.0000 g | INTRAVENOUS | Status: AC
  Administered 2023-07-23: 3 g via INTRAVENOUS
  Filled 2023-07-23: qty 8

## 2023-07-23 MED ORDER — HYDROCODONE-ACETAMINOPHEN 5-325 MG PO TABS
1.0000 | ORAL_TABLET | ORAL | Status: DC | PRN
  Administered 2023-07-23 – 2023-07-31 (×7): 1 via ORAL
  Filled 2023-07-23 (×7): qty 1

## 2023-07-23 MED ORDER — SUCCINYLCHOLINE CHLORIDE 200 MG/10ML IV SOSY
PREFILLED_SYRINGE | INTRAVENOUS | Status: AC
  Filled 2023-07-23: qty 10

## 2023-07-23 MED ORDER — OXYMETAZOLINE HCL 0.05 % NA SOLN
NASAL | Status: AC
  Filled 2023-07-23: qty 30

## 2023-07-23 MED ORDER — ALBUMIN HUMAN 5 % IV SOLN
INTRAVENOUS | Status: DC | PRN
Start: 2023-07-23 — End: 2023-07-23

## 2023-07-23 MED ORDER — ONDANSETRON HCL 4 MG/2ML IJ SOLN
INTRAMUSCULAR | Status: DC | PRN
  Administered 2023-07-23: 4 mg via INTRAVENOUS

## 2023-07-23 MED ORDER — FENTANYL CITRATE (PF) 250 MCG/5ML IJ SOLN
INTRAMUSCULAR | Status: DC | PRN
  Administered 2023-07-23: 50 ug via INTRAVENOUS

## 2023-07-23 MED ORDER — MIDAZOLAM HCL 2 MG/2ML IJ SOLN
INTRAMUSCULAR | Status: AC
  Filled 2023-07-23: qty 2

## 2023-07-23 SURGICAL SUPPLY — 35 items
BAG COUNTER SPONGE SURGICOUNT (BAG) IMPLANT
BAG SPNG CNTER NS LX DISP (BAG)
BLADE SURG 15 STRL LF DISP TIS (BLADE) ×1 IMPLANT
BLADE SURG 15 STRL SS (BLADE)
BUR CROSS CUT FISSURE 1.6 (BURR) ×1 IMPLANT
BUR EGG ELITE 4.0 (BURR) ×1 IMPLANT
CANISTER SUCT 3000ML PPV (MISCELLANEOUS) ×1 IMPLANT
COVER SURGICAL LIGHT HANDLE (MISCELLANEOUS) ×1 IMPLANT
GAUZE PACKING FOLDED 2 STR (GAUZE/BANDAGES/DRESSINGS) ×1 IMPLANT
GLOVE BIO SURGEON STRL SZ 6.5 (GLOVE) IMPLANT
GLOVE BIO SURGEON STRL SZ7 (GLOVE) IMPLANT
GLOVE BIO SURGEON STRL SZ8 (GLOVE) ×1 IMPLANT
GLOVE BIOGEL PI IND STRL 6.5 (GLOVE) IMPLANT
GLOVE BIOGEL PI IND STRL 7.0 (GLOVE) IMPLANT
GOWN STRL REUS W/ TWL LRG LVL3 (GOWN DISPOSABLE) ×1 IMPLANT
GOWN STRL REUS W/ TWL XL LVL3 (GOWN DISPOSABLE) ×1 IMPLANT
GOWN STRL REUS W/TWL LRG LVL3 (GOWN DISPOSABLE) ×1
GOWN STRL REUS W/TWL XL LVL3 (GOWN DISPOSABLE) ×1
IV NS 1000ML (IV SOLUTION) ×1
IV NS 1000ML BAXH (IV SOLUTION) ×1 IMPLANT
KIT BASIN OR (CUSTOM PROCEDURE TRAY) ×1 IMPLANT
KIT TURNOVER KIT B (KITS) ×1 IMPLANT
NDL HYPO 25GX1X1/2 BEV (NEEDLE) ×2 IMPLANT
NEEDLE HYPO 25GX1X1/2 BEV (NEEDLE) ×1
NS IRRIG 1000ML POUR BTL (IV SOLUTION) ×1 IMPLANT
PAD ARMBOARD 7.5X6 YLW CONV (MISCELLANEOUS) ×1 IMPLANT
SLEEVE IRRIGATION ELITE 7 (MISCELLANEOUS) ×1 IMPLANT
SPIKE FLUID TRANSFER (MISCELLANEOUS) ×1 IMPLANT
SPONGE SURGIFOAM ABS GEL 12-7 (HEMOSTASIS) IMPLANT
SUT CHROMIC 3 0 PS 2 (SUTURE) ×1 IMPLANT
SYR BULB IRRIG 60ML STRL (SYRINGE) ×1 IMPLANT
SYR CONTROL 10ML LL (SYRINGE) ×1 IMPLANT
TRAY ENT MC OR (CUSTOM PROCEDURE TRAY) ×1 IMPLANT
TUBING IRRIGATION (MISCELLANEOUS) ×1 IMPLANT
YANKAUER SUCT BULB TIP NO VENT (SUCTIONS) ×1 IMPLANT

## 2023-07-23 NOTE — Op Note (Signed)
07/23/2023  11:12 AM  PATIENT:  Joel York  37 y.o. male  PRE-OPERATIVE DIAGNOSIS:  NON RESTORABLE TEETH # 2, 15, 32.  POST-OPERATIVE DIAGNOSIS:  SAME  PROCEDURE:  Procedure(s): EXTRACTION TEETH # 2, 15, 32.  SURGEON:  Surgeon(s): Ocie Doyne, DMD  ANESTHESIA:   local and general  EBL:  minimal  DRAINS: none   SPECIMEN:  No Specimen  COUNTS:  YES  PLAN OF CARE: ICU then return to floor  PATIENT DISPOSITION:  PACU - hemodynamically stable.   PROCEDURE DETAILS: Dictation #[id number]  Joel York, DMD 07/23/2023 11:12 AM

## 2023-07-23 NOTE — Transfer of Care (Signed)
Immediate Anesthesia Transfer of Care Note  Patient: Joel York  Procedure(s) Performed: DENTAL RESTORATION/EXTRACTIONS (Mouth)  Patient Location: ICU  Anesthesia Type:General  Level of Consciousness: awake, alert , oriented, patient cooperative, and responds to stimulation  Airway & Oxygen Therapy: Patient Spontanous Breathing and Patient connected to face mask oxygen  Post-op Assessment: Report given to RN, Post -op Vital signs reviewed and stable, and Patient moving all extremities X 4  Post vital signs: Reviewed and stable  Last Vitals:  Vitals Value Taken Time  BP    Temp    Pulse    Resp    SpO2      Last Pain:  Vitals:   07/23/23 0800  TempSrc:   PainSc: 0-No pain      Patients Stated Pain Goal: 0 (07/23/23 0400)  Complications: No notable events documented.

## 2023-07-23 NOTE — Plan of Care (Signed)
  Problem: Education: Goal: Knowledge of General Education information will improve Description: Including pain rating scale, medication(s)/side effects and non-pharmacologic comfort measures Outcome: Progressing   Problem: Clinical Measurements: Goal: Ability to maintain clinical measurements within normal limits will improve Outcome: Progressing Goal: Diagnostic test results will improve Outcome: Progressing   Problem: Coping: Goal: Level of anxiety will decrease Outcome: Progressing   

## 2023-07-23 NOTE — Progress Notes (Signed)
Joel York PROGRESS NOTE:   SUBJECTIVE: Jaw hurts.  OBJECTIVE:  Vitals: Blood pressure 105/75, pulse (!) 132, temperature 97.9 F (36.6 C), temperature source Oral, resp. rate 17, height 5\' 7"  (1.702 m), weight 69.3 kg, SpO2 98%. Lab results: Results for orders placed or performed during the hospital encounter of 07/14/23 (from the past 24 hour(s))  Cooxemetry Panel (carboxy, met, total hgb, O2 sat)     Status: Abnormal   Collection Time: 07/23/23  5:30 AM  Result Value Ref Range   Total hemoglobin 17.3 (H) 12.0 - 16.0 g/dL   O2 Saturation 09.8 %   Carboxyhemoglobin 1.7 (H) 0.5 - 1.5 %   Methemoglobin <0.7 0.0 - 1.5 %  CBC with Differential/Platelet     Status: Abnormal   Collection Time: 07/23/23  5:30 AM  Result Value Ref Range   WBC 7.8 4.0 - 10.5 K/uL   RBC 5.81 4.22 - 5.81 MIL/uL   Hemoglobin 17.0 13.0 - 17.0 g/dL   HCT 11.9 (H) 14.7 - 82.9 %   MCV 90.0 80.0 - 100.0 fL   MCH 29.3 26.0 - 34.0 pg   MCHC 32.5 30.0 - 36.0 g/dL   RDW 56.2 13.0 - 86.5 %   Platelets 214 150 - 400 K/uL   nRBC 0.0 0.0 - 0.2 %   Neutrophils Relative % 50 %   Neutro Abs 4.0 1.7 - 7.7 K/uL   Lymphocytes Relative 33 %   Lymphs Abs 2.5 0.7 - 4.0 K/uL   Monocytes Relative 10 %   Monocytes Absolute 0.8 0.1 - 1.0 K/uL   Eosinophils Relative 6 %   Eosinophils Absolute 0.4 0.0 - 0.5 K/uL   Basophils Relative 1 %   Basophils Absolute 0.1 0.0 - 0.1 K/uL   Immature Granulocytes 0 %   Abs Immature Granulocytes 0.01 0.00 - 0.07 K/uL  Basic metabolic panel     Status: Abnormal   Collection Time: 07/23/23  5:30 AM  Result Value Ref Range   Sodium 139 135 - 145 mmol/L   Potassium 4.4 3.5 - 5.1 mmol/L   Chloride 105 98 - 111 mmol/L   CO2 25 22 - 32 mmol/L   Glucose, Bld 92 70 - 99 mg/dL   BUN 17 6 - 20 mg/dL   Creatinine, Ser 7.84 0.61 - 1.24 mg/dL   Calcium 8.4 (L) 8.9 - 10.3 mg/dL   GFR, Estimated >69 >62 mL/min   Anion gap 9 5 - 15  Magnesium     Status: None   Collection Time: 07/23/23  5:30  AM  Result Value Ref Range   Magnesium 2.1 1.7 - 2.4 mg/dL  Surgical PCR screen     Status: Abnormal   Collection Time: 07/23/23  9:06 AM   Specimen: Nasal Mucosa; Nasal Swab  Result Value Ref Range   MRSA, PCR NEGATIVE NEGATIVE   Staphylococcus aureus POSITIVE (A) NEGATIVE   Radiology Results: ABORTED INVASIVE LAB PROCEDURE  Result Date: 07/22/2023 This case was aborted.  ECHOCARDIOGRAM LIMITED  Result Date: 07/21/2023    ECHOCARDIOGRAM LIMITED REPORT   Patient Name:   Joel York Date of Exam: 07/21/2023 Medical Rec #:  [id number]    Height:       67.0 in Accession #:    [id number]   Weight:       153.0 lb Date of Birth:  Dec 03, 1986    BSA:          1.805 m Patient Age:    37 years  BP:           120/87 mmHg Patient Gender: M            HR:           99 bpm. Exam Location:  Inpatient Procedure: Limited Echo and Intracardiac Opacification Agent Indications:    CHF  History:        Patient has prior history of Echocardiogram examinations, most                 recent 07/14/2023.  Sonographer:    Harriette Bouillon Referring Phys: [phone] LINDSAY NICOLE [name] IMPRESSIONS  1. Left ventricular ejection fraction, by estimation, is <20%. The left ventricle has severely decreased function. The left ventricle demonstrates global hypokinesis. The left ventricular internal cavity size was severely dilated. No LV thrombus.  2. Limited echo, no doppler, only LV evaluation. FINDINGS  Left Ventricle: Left ventricular ejection fraction, by estimation, is <20%. The left ventricle has severely decreased function. The left ventricle demonstrates global hypokinesis. Definity contrast agent was given IV to delineate the left ventricular endocardial borders. The left ventricular internal cavity size was severely dilated. There is no left ventricular hypertrophy. LEFT VENTRICLE PLAX 2D LVIDd:         7.50 cm LVIDs:         7.20 cm LV PW:         0.90 cm LV IVS:        0.90 cm  Joel McleanMD Electronically signed by Joel York Signature Date/Time: 07/21/2023/5:48:31 PM    Final    General appearance: alert, cooperative, and no distress Head: Normocephalic, without obvious abnormality, atraumatic Eyes: negative Throat: Sutures intact. Hemostatic. No trismus, pharynx clear.   Neck: supple, symmetrical, trachea midline  ASSESSMENT: Stable s/p dental extractions.  PLAN: Continue current treatment.    Joel York 07/23/2023

## 2023-07-23 NOTE — Anesthesia Postprocedure Evaluation (Signed)
Anesthesia Post Note  Patient: Jentry Slayton  Procedure(s) Performed: DENTAL RESTORATION/EXTRACTIONS (Mouth)     Patient location during evaluation: PACU Anesthesia Type: General Level of consciousness: awake and alert Pain management: pain level controlled Vital Signs Assessment: post-procedure vital signs reviewed and stable Respiratory status: spontaneous breathing, nonlabored ventilation, respiratory function stable and patient connected to nasal cannula oxygen Cardiovascular status: blood pressure returned to baseline and stable Postop Assessment: no apparent nausea or vomiting Anesthetic complications: no  No notable events documented.  Last Vitals:  Vitals:   07/23/23 1300 07/23/23 1400  BP: 103/73 105/75  Pulse: (!) 118 (!) 132  Resp: 13 17  Temp:    SpO2: 97% 98%    Last Pain:  Vitals:   07/23/23 1338  TempSrc:   PainSc: 6                  Kennieth Rad

## 2023-07-23 NOTE — Anesthesia Procedure Notes (Signed)
Procedure Name: Intubation Date/Time: 07/23/2023 10:49 AM  Performed by: Shary Decamp, CRNAPre-anesthesia Checklist: Patient identified, Patient being monitored, Timeout performed, Emergency Drugs available and Suction available Patient Re-evaluated:Patient Re-evaluated prior to induction Oxygen Delivery Method: Circle System Utilized Preoxygenation: Pre-oxygenation with 100% oxygen Induction Type: IV induction Ventilation: Mask ventilation without difficulty Laryngoscope Size: Miller and 2 Grade View: Grade I Tube type: Oral Tube size: 7.5 mm Number of attempts: 1 Airway Equipment and Method: Stylet Placement Confirmation: ETT inserted through vocal cords under direct vision, positive ETCO2 and breath sounds checked- equal and bilateral Secured at: 21 cm Tube secured with: Tape Dental Injury: Teeth and Oropharynx as per pre-operative assessment

## 2023-07-23 NOTE — Anesthesia Preprocedure Evaluation (Addendum)
Anesthesia Evaluation  Patient identified by MRN, date of birth, ID band Patient awake    Reviewed: Allergy & Precautions, NPO status , Patient's Chart, lab work & pertinent test results  Airway Mallampati: II  TM Distance: >3 FB Neck ROM: Full    Dental  (+) Dental Advisory Given   Pulmonary asthma , Current Smoker   breath sounds clear to auscultation       Cardiovascular +CHF   Rhythm:Regular Rate:Normal     Neuro/Psych Seizures -,     GI/Hepatic Neg liver ROS,GERD  ,,  Endo/Other  negative endocrine ROS    Renal/GU Renal disease     Musculoskeletal   Abdominal   Peds  Hematology negative hematology ROS (+)   Anesthesia Other Findings   Reproductive/Obstetrics                             Anesthesia Physical Anesthesia Plan  ASA: 4  Anesthesia Plan: General   Post-op Pain Management: Minimal or no pain anticipated   Induction: Intravenous  PONV Risk Score and Plan: 1 and Dexamethasone, Ondansetron, Midazolam and Treatment may vary due to age or medical condition  Airway Management Planned: Oral ETT  Additional Equipment: ClearSight  Intra-op Plan:   Post-operative Plan: Extubation in OR  Informed Consent: I have reviewed the patients History and Physical, chart, labs and discussed the procedure including the risks, benefits and alternatives for the proposed anesthesia with the patient or authorized representative who has indicated his/her understanding and acceptance.     Dental advisory given  Plan Discussed with: CRNA  Anesthesia Plan Comments:        Anesthesia Quick Evaluation

## 2023-07-23 NOTE — H&P (Signed)
H&P documentation  -History and Physical Reviewed  -Patient has been re-examined  -No change in the plan of care  Scott Jensen  

## 2023-07-23 NOTE — Progress Notes (Signed)
1 Day Post-Op Procedure(s) (LRB): INVASIVE LAB ABORTED CASE (N/A) Subjective: Ready for dental extractions I discussed with the patient the plan for HM3 implant next week after his mouth can heal to reduce risk of infection. We discussed the surgical details of VAD implant including the location of the incisions, use of cardiopulmonary bypass, and the expected postoperative ICU care he will require. He understands the risks involved with preop  RV dysfunction and potential need for temporary RV mrchanical support.  Objective: Vital signs in last 24 hours: Temp:  [97.8 F (36.6 C)-99 F (37.2 C)] 98.2 F (36.8 C) (07/31 0756) Pulse Rate:  [93-236] 180 (07/31 0800) Cardiac Rhythm: Sinus tachycardia (07/31 0400) Resp:  [6-29] 17 (07/31 0800) BP: (85-118)/(55-101) 102/75 (07/31 0800) SpO2:  [92 %-99 %] 99 % (07/31 0800) Weight:  [69.3 kg] 69.3 kg (07/31 0340)  Hemodynamic parameters for last 24 hours: CVP:  [0 mmHg-6 mmHg] 4 mmHg  Intake/Output from previous day: 07/30 0701 - 07/31 0700 In: 415.6 [P.O.:120; I.V.:295.6] Out: 4250 [Urine:4250] Intake/Output this shift: Total I/O In: 10 [I.V.:10] Out: 300 [Urine:300]  EXAM Alert, comfortable No murmur Nsr Lungs clear  Lab Results: Recent Labs    07/22/23 0409 07/23/23 0530  WBC 8.6 7.8  HGB 16.4 17.0  HCT 49.6 52.3*  PLT 205 214   BMET:  Recent Labs    07/22/23 0409 07/23/23 0530  NA 134* 139  K 3.4* 4.4  CL 101 105  CO2 26 25  GLUCOSE 183* 92  BUN 19 17  CREATININE 1.20 1.18  CALCIUM 7.5* 8.4*    PT/INR: No results for input(s): "LABPROT", "INR" in the last 72 hours. ABG    Component Value Date/Time   PHART 7.456 (H) 07/16/2023 1440   HCO3 28.4 (H) 07/16/2023 1447   HCO3 31.1 (H) 07/16/2023 1447   TCO2 30 07/16/2023 1447   TCO2 33 (H) 07/16/2023 1447   O2SAT 69.5 07/23/2023 0530   CBG (last 3)  No results for input(s): "GLUCAP" in the last 72 hours.  Assessment/Plan: S/P Procedure(s)  (LRB): INVASIVE LAB ABORTED CASE (N/A) Dental extractions, cont preop inotropic support, repeat RHC 48 hrs before VAD implant.   LOS: 9 days    Lovett Sox 07/23/2023

## 2023-07-23 NOTE — Progress Notes (Signed)
Advanced Heart Failure Rounding Note  PCP-Cardiologist: None   Subjective:    07/23: Initial CO-OX 30%. Started on milrinone 0.25 + NE.  7/25: Milrinone increased to 0.375  CO-OX 70% on milrinone 0.375 + NE 2.  CVP 5.   Renal function stable. Remains tachycardic.  Feeling well. No complaints this am. Going for dental extraction today.     cMRI:  LV markedly dilated EF 10% RV 17% NICM  R/LHC 07/24: Findings:   On milrinone 0.25 and NE 2    Ao = 97/70 (80) LV = 112/31 RA = 7 RV = 43/12 PA = 42/21 (30) PCW = 21 (v = 25) Fick cardiac output/index = 3.3/1.9 PVR = 2.7 WU SVR = 1778 FA sat = 95% PA sat = 67%, 67% PAPi = 3.0   Assessment: 1. Severe NICM EF < 10% 2. Normal coronaries 3. Elevated filling pressures and low cardiac output despite milrinone/NE support    Objective:   Weight Range: 69.3 kg Body mass index is 23.93 kg/m.   Vital Signs:   Temp:  [97.8 F (36.6 C)-99 F (37.2 C)] 98.2 F (36.8 C) (07/31 0756) Pulse Rate:  [93-236] 102 (07/31 0700) Resp:  [6-29] 9 (07/31 0700) BP: (85-118)/(55-101) 110/77 (07/31 0700) SpO2:  [92 %-99 %] 96 % (07/31 0700) Weight:  [69.3 kg] 69.3 kg (07/31 0340) Last BM Date : 07/22/23  Weight change: Filed Weights   07/21/23 0545 07/22/23 0500 07/23/23 0340  Weight: 69.4 kg 69.2 kg 69.3 kg   Intake/Output:   Intake/Output Summary (Last 24 hours) at 07/23/2023 0857 Last data filed at 07/23/2023 0700 Gross per 24 hour  Intake 367.73 ml  Output 3825 ml  Net -3457.27 ml    CVP 5 Physical Exam   General: Well appearing. HEENT: normal Neck: supple. no JVD. Carotids 2+ bilat; no bruits.  Cor: PMI nondisplaced. Regular rate & rhythm, tachy. No rubs, gallops or murmurs. Lungs: clear Abdomen: soft, nontender, nondistended.  Extremities: no cyanosis, clubbing, rash, edema, RUE PICC Neuro: alert & orientedx3. Affect pleasant   Telemetry   Sinus tach 120s  Labs    CBC Recent Labs     07/22/23 0409 07/23/23 0530  WBC 8.6 7.8  NEUTROABS 3.8 4.0  HGB 16.4 17.0  HCT 49.6 52.3*  MCV 90.8 90.0  PLT 205 214   Basic Metabolic Panel Recent Labs    45/40/98 0559 07/22/23 0409 07/23/23 0530  NA 135 134* 139  K 3.8 3.4* 4.4  CL 99 101 105  CO2 26 26 25   GLUCOSE 243* 183* 92  BUN 17 19 17   CREATININE 1.24 1.20 1.18  CALCIUM 7.9* 7.5* 8.4*  MG 1.8  --  2.1   Liver Function Tests No results for input(s): "AST", "ALT", "ALKPHOS", "BILITOT", "PROT", "ALBUMIN" in the last 72 hours.  No results for input(s): "LIPASE", "AMYLASE" in the last 72 hours. Cardiac Enzymes No results for input(s): "CKTOTAL", "CKMB", "CKMBINDEX", "TROPONINI" in the last 72 hours.  BNP: BNP (last 3 results) Recent Labs    07/14/23 0101  BNP 1,810.8*  2,494.6*    Other results:  Imaging   ABORTED INVASIVE LAB PROCEDURE  Result Date: 07/22/2023 This case was aborted.  Medications:   Scheduled Medications:  buprenorphine-naloxone  1 tablet Sublingual Daily   Chlorhexidine Gluconate Cloth  6 each Topical Daily   digoxin  0.125 mg Oral Daily   enoxaparin (LOVENOX) injection  40 mg Subcutaneous Q24H   ipratropium  1 spray Each Nare BID  lactulose  20 g Oral TID   loratadine  10 mg Oral QPM   melatonin  3 mg Oral QHS   mexiletine  200 mg Oral BID   mometasone-formoterol  2 puff Inhalation BID   montelukast  10 mg Oral q morning   sodium chloride flush  10-40 mL Intracatheter Q12H   sodium chloride flush  3 mL Intravenous Q12H   sodium chloride flush  3 mL Intravenous Q12H    Infusions:  sodium chloride     sodium chloride      ceFAZolin (ANCEF) IV     milrinone 0.375 mcg/kg/min (07/23/23 0700)   norepinephrine (LEVOPHED) Adult infusion 2 mcg/min (07/23/23 0700)    PRN Medications: sodium chloride, sodium chloride, acetaminophen, acetaminophen, acetaminophen, alum & mag hydroxide-simeth, levalbuterol, LORazepam, nitroGLYCERIN, ondansetron (ZOFRAN) IV, mouth rinse, sodium  chloride flush, sodium chloride flush, sodium chloride flush  Patient Profile   Joel York is a 37 y.o. male with HTN, ADD, asthma, hx drug abuse (now on suboxone x5 yrs), and seizures. Admitted with acute systolic heart failure.   Assessment/Plan  Acute biventricular systolic heart failure >> cardiogenic shock - Echo EF <20%,  RV mildly reduced, RV mod enlarged, estimated RV systolic pressure 53.50mmHg, LA mod dilated, RA mod dilated, mild-mod MR/TR - Etiology uncertain. ? 2/2 PVCs vs genetically mediated +/- hypertension.  - LHC with normal coronaries. RHC 07/24: Low CO (Fick CO/CI 3.3/1.9) and PAPi 3.0 on milrinone 0.25 + NE 2 - cMRI:  LV markedly dilated EF 10% RV 17% NICM - Limited echo 07/29: LVEF < 20%, RV function improved on Dr. Gasper Lloyd review  - Initial CO-OX 30%. Lactic acid 2.5>>cleared.  - CO-OX stable on milrinone 0.375 + NE 2. Continue current inotrope support. - CVP 5. Give 40 mg po lasix today.  - Continue digoxin 0.125 - VAD w/u underway. RV dysfunction will be a barrier. Discussed with [facility], at this time his only option is LVAD due to tobacco use.  - Going for dental extraction today. Will arrange for peri-op antibiotics, discussed with PharmD.  - VAD implant scheduled for 08/07, will need RHC on 08/05.   Hypertension - Has actually been hypotensive this admit. BP stable on current support.  Chest pressure - HsTrop 39>43>37>40, d/t demand ischemia - No CAD on cath  PVCs - snores, will need eventual sleep study  - Suppressed with Mexiletine 200 mg BID - TSH 4.9, T4 0.79 - Keep K>4 and Mg >2  AKI - SCr 1.02 in March, up to 1.9 in setting of low-output - Scr now improved to 1.18  Tobacco use - smokes 3-6 cigarettes / day - cessation discussed  Hx drug abuse - Continue suboxone  Elevated LFTs - In setting of low-output HF, improved  9. Hypokalemia/hypomagnesemia - K 4.4, Mag 2.1 - Supp as needed with diuresis   Anna Genre,  PA-C Advanced Heart Failure 8:57 AM

## 2023-07-24 ENCOUNTER — Encounter (HOSPITAL_COMMUNITY): Payer: Self-pay | Admitting: Oral Surgery

## 2023-07-24 DIAGNOSIS — I5021 Acute systolic (congestive) heart failure: Secondary | ICD-10-CM | POA: Diagnosis not present

## 2023-07-24 LAB — COOXEMETRY PANEL
Carboxyhemoglobin: 1.7 % — ABNORMAL HIGH (ref 0.5–1.5)
Methemoglobin: <0.7 % (ref 0.0–1.5)
O2 Saturation: 78.4 %
Total hemoglobin: 15.9 g/dL (ref 12.0–16.0)

## 2023-07-24 MED ORDER — POLYETHYLENE GLYCOL 3350 17 G PO PACK
17.0000 g | PACK | Freq: Every day | ORAL | Status: DC
  Administered 2023-07-26: 17 g via ORAL
  Filled 2023-07-24: qty 1

## 2023-07-24 MED ORDER — POTASSIUM CHLORIDE CRYS ER 20 MEQ PO TBCR: 40.0000 meq | EXTENDED_RELEASE_TABLET | Freq: Once | ORAL | Status: DC

## 2023-07-24 MED ORDER — FUROSEMIDE 10 MG/ML IJ SOLN
60.0000 mg | Freq: Once | INTRAMUSCULAR | Status: DC
Start: 2023-07-24 — End: 2023-07-24

## 2023-07-24 MED ORDER — POTASSIUM CHLORIDE CRYS ER 20 MEQ PO TBCR
40.0000 meq | EXTENDED_RELEASE_TABLET | Freq: Every day | ORAL | Status: DC
  Administered 2023-07-24: 40 meq via ORAL
  Filled 2023-07-24: qty 2

## 2023-07-24 NOTE — Progress Notes (Addendum)
Advanced Heart Failure Rounding Note  PCP-Cardiologist: None   Subjective:    07/23: Initial CO-OX 30%. Started on milrinone 0.25 + NE.  7/25: Milrinone increased to 0.375  CO-OX 78% on milrinone 0.375. Off NE.   CVP 8  Had 3 dental extractions yesterday. Less pain this am.   No dyspnea.     cMRI:  LV markedly dilated EF 10% RV 17% NICM  R/LHC 07/24: Findings:   On milrinone 0.25 and NE 2    Ao = 97/70 (80) LV = 112/31 RA = 7 RV = 43/12 PA = 42/21 (30) PCW = 21 (v = 25) Fick cardiac output/index = 3.3/1.9 PVR = 2.7 WU SVR = 1778 FA sat = 95% PA sat = 67%, 67% PAPi = 3.0   Assessment: 1. Severe NICM EF < 10% 2. Normal coronaries 3. Elevated filling pressures and low cardiac output despite milrinone/NE support    Objective:   Weight Range: 67.4 kg Body mass index is 23.27 kg/m.   Vital Signs:   Temp:  [97.9 F (36.6 C)-98.2 F (36.8 C)] 98 F (36.7 C) (08/01 0750) Pulse Rate:  [80-184] 104 (08/01 0945) Resp:  [7-24] 14 (08/01 0900) BP: (73-119)/(44-90) 107/73 (08/01 0900) SpO2:  [89 %-100 %] 97 % (08/01 0900) Weight:  [67.4 kg] 67.4 kg (08/01 0500) Last BM Date : 07/22/23  Weight change: Filed Weights   07/22/23 0500 07/23/23 0340 07/24/23 0500  Weight: 69.2 kg 69.3 kg 67.4 kg   Intake/Output:   Intake/Output Summary (Last 24 hours) at 07/24/2023 1015 Last data filed at 07/24/2023 0400 Gross per 24 hour  Intake 1016.15 ml  Output 1956 ml  Net -939.85 ml     Physical Exam   General:  Well appearing. HEENT: normal Neck: supple. JVP 7-8. Carotids 2+ bilat; no bruits.  Cor: PMI nondisplaced. Regular rate & rhythm, tachy. No rubs, gallops or murmurs. Lungs: clear Abdomen: soft, nontender, nondistended.  Extremities: no cyanosis, clubbing, rash, edema, + RUE PICC Neuro: alert & orientedx3. Affect pleasant    Telemetry   Sinus tach 100s-110s  Labs    CBC Recent Labs    07/23/23 0530 07/24/23 0412  WBC 7.8 8.1  NEUTROABS  4.0 4.9  HGB 17.0 15.9  HCT 52.3* 48.8  MCV 90.0 90.2  PLT 214 168   Basic Metabolic Panel Recent Labs    16/10/96 0530 07/24/23 0412  NA 139 136  K 4.4 3.8  CL 105 103  CO2 25 24  GLUCOSE 92 214*  BUN 17 15  CREATININE 1.18 1.36*  CALCIUM 8.4* 8.1*  MG 2.1  --    Liver Function Tests No results for input(s): "AST", "ALT", "ALKPHOS", "BILITOT", "PROT", "ALBUMIN" in the last 72 hours.  No results for input(s): "LIPASE", "AMYLASE" in the last 72 hours. Cardiac Enzymes No results for input(s): "CKTOTAL", "CKMB", "CKMBINDEX", "TROPONINI" in the last 72 hours.  BNP: BNP (last 3 results) Recent Labs    07/14/23 0101  BNP 1,810.8*  2,494.6*    Other results:  Imaging   No results found. Medications:   Scheduled Medications:  buprenorphine-naloxone  1 tablet Sublingual Daily   Chlorhexidine Gluconate Cloth  6 each Topical Daily   digoxin  0.125 mg Oral Daily   enoxaparin (LOVENOX) injection  40 mg Subcutaneous Q24H   furosemide  40 mg Oral Daily   ipratropium  1 spray Each Nare BID   lactulose  20 g Oral TID   loratadine  10 mg Oral QPM  melatonin  3 mg Oral QHS   mexiletine  200 mg Oral BID   mometasone-formoterol  2 puff Inhalation BID   montelukast  10 mg Oral q morning   mupirocin ointment  1 Application Nasal BID   sodium chloride flush  10-40 mL Intracatheter Q12H   sodium chloride flush  3 mL Intravenous Q12H   sodium chloride flush  3 mL Intravenous Q12H    Infusions:  sodium chloride     sodium chloride     milrinone 0.375 mcg/kg/min (07/24/23 0400)   norepinephrine (LEVOPHED) Adult infusion Stopped (07/24/23 0242)    PRN Medications: sodium chloride, sodium chloride, acetaminophen, acetaminophen, acetaminophen, alum & mag hydroxide-simeth, fentaNYL (SUBLIMAZE) injection, HYDROcodone-acetaminophen, levalbuterol, LORazepam, nitroGLYCERIN, ondansetron (ZOFRAN) IV, mouth rinse, sodium chloride flush, sodium chloride flush, sodium chloride  flush  Patient Profile   Joel York is a 37 y.o. male with HTN, ADD, asthma, hx drug abuse (now on suboxone x5 yrs), and seizures. Admitted with acute systolic heart failure.   Assessment/Plan  Acute biventricular systolic heart failure >> cardiogenic shock - Echo EF <20%,  RV mildly reduced, RV mod enlarged, estimated RV systolic pressure 53.56mmHg, LA mod dilated, RA mod dilated, mild-mod MR/TR - Etiology uncertain. ? 2/2 PVCs vs genetically mediated +/- hypertension.  - LHC with normal coronaries. RHC 07/24: Low CO (Fick CO/CI 3.3/1.9) and PAPi 3.0 on milrinone 0.25 + NE 2 - cMRI:  LV markedly dilated EF 10% RV 17% NICM - Limited echo 07/29: LVEF < 20%, RV function improved on Dr. Gasper Lloyd review  - Initial CO-OX 30%. Lactic acid 2.5>>cleared.  - CO-OX stable on milrinone 0.375. NE off - CVP 8. Give 40 mg po lasix today. - Continue digoxin 0.125 - VAD w/u underway. RV dysfunction will be a barrier. Discussed with [facility], at this time his only option is LVAD due to tobacco use.  - S/p dental extractions on 07/31 - VAD implant scheduled for 08/07, will need RHC on 08/05.   Hypertension - Has actually been hypotensive this admit. BP stable on current support.  Chest pressure - HsTrop 39>43>37>40, d/t demand ischemia - No CAD on cath  PVCs - snores, will need eventual sleep study  - Suppressed with Mexiletine 200 mg BID - TSH 4.9, T4 0.79 - Keep K>4 and Mg >2  AKI - SCr 1.02 in March, up to 1.9 in setting of low-output - Scr now improved  Tobacco use - smokes 3-6 cigarettes / day - cessation discussed  Hx drug abuse - Continue suboxone  Elevated LFTs - In setting of low-output HF, improved  9. Hypokalemia - K 3.8 - Supp with diuresis   Anna Genre, PA-C Advanced Heart Failure 10:15 AM

## 2023-07-24 NOTE — Plan of Care (Signed)
°  Problem: Education: Goal: Knowledge of General Education information will improve Description: Including pain rating scale, medication(s)/side effects and non-pharmacologic comfort measures Outcome: Progressing   Problem: Health Behavior/Discharge Planning: Goal: Ability to manage health-related needs will improve Outcome: Progressing   Problem: Nutrition: Goal: Adequate nutrition will be maintained Outcome: Progressing   Problem: Coping: Goal: Level of anxiety will decrease Outcome: Progressing   Problem: Elimination: Goal: Will not experience complications related to bowel motility Outcome: Progressing Goal: Will not experience complications related to urinary retention Outcome: Progressing   Problem: Pain Managment: Goal: General experience of comfort will improve Outcome: Progressing

## 2023-07-24 NOTE — Progress Notes (Addendum)
Brief MCS Note:  VAD Coordinator's met with pt at bedside this afternoon following dental extraction yesterday. Pt reports pain at extraction site but minimal swelling and drainage. Pt to have RHC Monday and VAD implant Wednesday. Pt states he is "anxious to get it over with and to get out of this room". VAD Coordinator asked if pt would like to walk outside to get some fresh air. Discussed with Dr. Gasper Lloyd. Vitals and IV medications monitored by myself. Pt remained stable throughout activity and returned to 2H26 to chair. Bedside RN updated once pt had returned to room.  Simmie Davies RN, BSN VAD Coordinator 24/7 Pager [phone]

## 2023-07-25 DIAGNOSIS — I5021 Acute systolic (congestive) heart failure: Secondary | ICD-10-CM | POA: Diagnosis not present

## 2023-07-25 LAB — COOXEMETRY PANEL
Carboxyhemoglobin: 1.6 % — ABNORMAL HIGH (ref 0.5–1.5)
Methemoglobin: <0.7 % (ref 0.0–1.5)
O2 Saturation: 61.9 %
Total hemoglobin: 16.2 g/dL — ABNORMAL HIGH (ref 12.0–16.0)

## 2023-07-25 MED ORDER — MAGNESIUM SULFATE 2 GM/50ML IV SOLN
2.0000 g | Freq: Once | INTRAVENOUS | Status: AC
  Administered 2023-07-25: 2 g via INTRAVENOUS
  Filled 2023-07-25: qty 50

## 2023-07-25 MED ORDER — POTASSIUM CHLORIDE CRYS ER 20 MEQ PO TBCR
20.0000 meq | EXTENDED_RELEASE_TABLET | Freq: Every day | ORAL | Status: DC
  Administered 2023-07-25: 20 meq via ORAL
  Filled 2023-07-25: qty 1

## 2023-07-25 MED ORDER — SENNOSIDES-DOCUSATE SODIUM 8.6-50 MG PO TABS
2.0000 | ORAL_TABLET | Freq: Every day | ORAL | Status: DC
  Filled 2023-07-25 (×2): qty 2

## 2023-07-25 NOTE — Progress Notes (Signed)
This chaplain responded to PMT NP-Julia consult for creating/updating the Pt. Advance Directive:  HCPOA and Living Will.  The Pt. is awake, agrees to the visit, and is beginning to eat lunch.  The chaplain continued AD education previously started by NP-Julia. The Pt. answered clarifying questions after the chaplain's AD education was complete.    The chaplain understands the Pt. is naming Winthrop Shannahan as the healthcare agent.   The Pt. completed a Living Will with his initial on "I have a condition that cannot be cured and that will result in my death within a relatively short period of time." only. The Pt. gives "the healthcare agent the authority to make decisions that are different from what I have indicated in this living will."  The chaplain is present with the Pt., notary and witnesses for the notarizing of the Pt. Advance Directive:  HCPOA and Living. Will.   After notarizing the Pt. AD, the chaplain gave the Pt. the original AD along with one copy. The chaplain scanned the Pt. AD into Pt. EMR.  The Pt. gave the chaplain permission to update the Emergency Contacts to [facility]. The chaplain updated the Pt. RN-Amelia.  This chaplain is available for F/U spiritual care as needed.  Chaplain Stephanie Acre [phone]

## 2023-07-25 NOTE — Progress Notes (Signed)
Advanced Heart Failure Rounding Note  PCP-Cardiologist: None   Subjective:    07/23: Initial CO-OX 30%. Started on milrinone 0.25 + NE.  7/25: Milrinone increased to 0.375  On Milrinone 0.375. Co-ox 53%. ? Accurate. Repeat pending   CVP 5. Scr 1.36>>1.29   OOB, sitting up in chair. Feels ok. No current resting dyspnea. Able to ambulate outside yesterday w/o much exertional dyspnea.     cMRI:  LV markedly dilated EF 10% RV 17% NICM  R/LHC 07/24: Findings:   On milrinone 0.25 and NE 2    Ao = 97/70 (80) LV = 112/31 RA = 7 RV = 43/12 PA = 42/21 (30) PCW = 21 (v = 25) Fick cardiac output/index = 3.3/1.9 PVR = 2.7 WU SVR = 1778 FA sat = 95% PA sat = 67%, 67% PAPi = 3.0   Assessment: 1. Severe NICM EF < 10% 2. Normal coronaries 3. Elevated filling pressures and low cardiac output despite milrinone/NE support    Objective:   Weight Range: 68.3 kg Body mass index is 23.58 kg/m.   Vital Signs:   Temp:  [97.1 F (36.2 C)-98.9 F (37.2 C)] 97.1 F (36.2 C) (08/02 0831) Pulse Rate:  [81-134] 109 (08/02 0753) Resp:  [12-20] 16 (08/02 0753) BP: (87-116)/(59-90) 115/75 (08/02 0700) SpO2:  [87 %-98 %] 95 % (08/02 0753) Weight:  [68.3 kg] 68.3 kg (08/02 0500) Last BM Date : 07/22/23  Weight change: Filed Weights   07/23/23 0340 07/24/23 0500 07/25/23 0500  Weight: 69.3 kg 67.4 kg 68.3 kg   Intake/Output:   Intake/Output Summary (Last 24 hours) at 07/25/2023 0837 Last data filed at 07/25/2023 0600 Gross per 24 hour  Intake 166.35 ml  Output 2050 ml  Net -1883.65 ml     Physical Exam   CVP 5  General:  Well appearing, sitting up in chair. No respiratory difficulty HEENT: normal Neck: supple. no JVD. Carotids 2+ bilat; no bruits. No lymphadenopathy or thyromegaly appreciated. Cor: PMI nondisplaced. Regular rate & rhythm. No rubs, gallops or murmurs. Lungs: clear Abdomen: soft, nontender, nondistended. No hepatosplenomegaly. No bruits or masses. Good  bowel sounds. Extremities: no cyanosis, clubbing, rash, edema +RUE PICC  Neuro: alert & oriented x 3, cranial nerves grossly intact. moves all 4 extremities w/o difficulty. Affect pleasant.  Telemetry   Sinus tach 100s-110s  Labs    CBC Recent Labs    07/24/23 0412 07/25/23 0358  WBC 8.1 8.7  NEUTROABS 4.9 4.9  HGB 15.9 16.0  HCT 48.8 48.6  MCV 90.2 91.4  PLT 168 164   Basic Metabolic Panel Recent Labs    28/41/32 0530 07/24/23 0412 07/25/23 0358  NA 139 136 136  K 4.4 3.8 4.2  CL 105 103 98  CO2 25 24 28   GLUCOSE 92 214* 148*  BUN 17 15 18   CREATININE 1.18 1.36* 1.29*  CALCIUM 8.4* 8.1* 8.4*  MG 2.1  --  1.8   Liver Function Tests No results for input(s): "AST", "ALT", "ALKPHOS", "BILITOT", "PROT", "ALBUMIN" in the last 72 hours.  No results for input(s): "LIPASE", "AMYLASE" in the last 72 hours. Cardiac Enzymes No results for input(s): "CKTOTAL", "CKMB", "CKMBINDEX", "TROPONINI" in the last 72 hours.  BNP: BNP (last 3 results) Recent Labs    07/14/23 0101  BNP 1,810.8*  2,494.6*    Other results:  Imaging   No results found. Medications:   Scheduled Medications:  buprenorphine-naloxone  1 tablet Sublingual Daily   Chlorhexidine Gluconate Cloth  6  each Topical Daily   digoxin  0.125 mg Oral Daily   enoxaparin (LOVENOX) injection  40 mg Subcutaneous Q24H   ipratropium  1 spray Each Nare BID   loratadine  10 mg Oral QPM   melatonin  3 mg Oral QHS   mexiletine  200 mg Oral BID   mometasone-formoterol  2 puff Inhalation BID   montelukast  10 mg Oral q morning   mupirocin ointment  1 Application Nasal BID   polyethylene glycol  17 g Oral Daily   potassium chloride  20 mEq Oral Daily   sodium chloride flush  10-40 mL Intracatheter Q12H   sodium chloride flush  3 mL Intravenous Q12H   sodium chloride flush  3 mL Intravenous Q12H    Infusions:  sodium chloride     sodium chloride     magnesium sulfate bolus IVPB     milrinone 0.375  mcg/kg/min (07/25/23 0600)   norepinephrine (LEVOPHED) Adult infusion Stopped (07/24/23 0242)    PRN Medications: sodium chloride, sodium chloride, acetaminophen, acetaminophen, acetaminophen, alum & mag hydroxide-simeth, fentaNYL (SUBLIMAZE) injection, HYDROcodone-acetaminophen, levalbuterol, LORazepam, nitroGLYCERIN, ondansetron (ZOFRAN) IV, mouth rinse, sodium chloride flush, sodium chloride flush, sodium chloride flush  Patient Profile   Joel York is a 37 y.o. male with HTN, ADD, asthma, hx drug abuse (now on suboxone x5 yrs), and seizures. Admitted with acute systolic heart failure.   Assessment/Plan  Acute biventricular systolic heart failure >> cardiogenic shock - Echo EF <20%,  RV mildly reduced, RV mod enlarged, estimated RV systolic pressure 53.60mmHg, LA mod dilated, RA mod dilated, mild-mod MR/TR - Etiology uncertain. ? 2/2 PVCs vs genetically mediated +/- hypertension.  - LHC with normal coronaries. RHC 07/24: Low CO (Fick CO/CI 3.3/1.9) and PAPi 3.0 on milrinone 0.25 + NE 2 - cMRI:  LV markedly dilated EF 10% RV 17% NICM - Limited echo 07/29: LVEF < 20%, RV function improved on Dr. Gasper Lloyd review  - Initial CO-OX 30%. Lactic acid 2.5>>cleared.  - Now on milrinone 0.375. NE off. Co-ox 53%. Will repeat - CVP 5. Hold diuretics today  - Continue digoxin 0.125 - VAD w/u underway. RV dysfunction will be a barrier. Discussed with [facility], at this time his only option is LVAD due to tobacco use.  - S/p dental extractions on 07/31 - VAD implant scheduled for 08/07, will need RHC on 08/05.   Hypertension - Has actually been hypotensive this admit. BP stable on current support.  Chest pressure - HsTrop 39>43>37>40, d/t demand ischemia - No CAD on cath  PVCs - snores, will need eventual sleep study  - Suppressed with Mexiletine 200 mg BID - TSH 4.9, T4 0.79 - Keep K>4 and Mg >2  AKI - SCr 1.02 in March, up to 1.9 in setting of low-output - Scr now improved -  continue milrinone support   Tobacco use - smokes 3-6 cigarettes / day - cessation discussed  Hx drug abuse - Continue suboxone  Elevated LFTs - In setting of low-output HF, improved  9. Hypokalemia - resolved, K 4.2 today    Laverne Klugh, PA-C  Advanced Heart Failure 8:37 AM

## 2023-07-25 NOTE — Plan of Care (Signed)
  Problem: Health Behavior/Discharge Planning: Goal: Ability to manage health-related needs will improve Outcome: Progressing   Problem: Clinical Measurements: Goal: Ability to maintain clinical measurements within normal limits will improve Outcome: Progressing   Problem: Activity: Goal: Risk for activity intolerance will decrease Outcome: Progressing   Problem: Elimination: Goal: Will not experience complications related to urinary retention Outcome: Progressing

## 2023-07-25 NOTE — Progress Notes (Signed)
Brief Nutrition Follow-up:  Pt continues to tolerate po diet well with good appetite per RN. Pt also continues to receive snacks and double portions. No issues reported with regards to chewing post dental extractions.   Plan remains for LVAD next week. RD to continue to follow  Joel Starcher MS, RDN, LDN, CNSC Registered Dietitian 3 Clinical Nutrition RD Pager and On-Call Pager Number Located in [place]

## 2023-07-25 NOTE — Plan of Care (Signed)
  Problem: Education: Goal: Knowledge of General Education information will improve Description: Including pain rating scale, medication(s)/side effects and non-pharmacologic comfort measures Outcome: Progressing   Problem: Health Behavior/Discharge Planning: Goal: Ability to manage health-related needs will improve Outcome: Progressing   Problem: Clinical Measurements: Goal: Ability to maintain clinical measurements within normal limits will improve Outcome: Progressing Goal: Will remain free from infection Outcome: Progressing Goal: Diagnostic test results will improve Outcome: Progressing Goal: Respiratory complications will improve Outcome: Progressing Goal: Cardiovascular complication will be avoided Outcome: Progressing   Problem: Activity: Goal: Risk for activity intolerance will decrease Outcome: Progressing   Problem: Nutrition: Goal: Adequate nutrition will be maintained Outcome: Progressing   Problem: Coping: Goal: Level of anxiety will decrease Outcome: Progressing   Problem: Elimination: Goal: Will not experience complications related to bowel motility Outcome: Progressing Goal: Will not experience complications related to urinary retention Outcome: Progressing   Problem: Pain Managment: Goal: General experience of comfort will improve Outcome: Progressing   Problem: Safety: Goal: Ability to remain free from injury will improve Outcome: Progressing   Problem: Skin Integrity: Goal: Risk for impaired skin integrity will decrease Outcome: Progressing   Problem: Education: Goal: Ability to demonstrate management of disease process will improve Outcome: Progressing Goal: Ability to verbalize understanding of medication therapies will improve Outcome: Progressing   Problem: Activity: Goal: Capacity to carry out activities will improve Outcome: Progressing   Problem: Cardiac: Goal: Ability to achieve and maintain adequate cardiopulmonary perfusion  will improve Outcome: Progressing   Problem: Activity: Goal: Ability to return to baseline activity level will improve Outcome: Progressing   Problem: Cardiovascular: Goal: Ability to achieve and maintain adequate cardiovascular perfusion will improve Outcome: Progressing Goal: Vascular access site(s) Level 0-1 will be maintained Outcome: Progressing   Problem: Health Behavior/Discharge Planning: Goal: Ability to safely manage health-related needs after discharge will improve Outcome: Progressing

## 2023-07-26 ENCOUNTER — Inpatient Hospital Stay (HOSPITAL_COMMUNITY): Payer: Medicaid Other

## 2023-07-26 DIAGNOSIS — I517 Cardiomegaly: Secondary | ICD-10-CM | POA: Diagnosis not present

## 2023-07-26 DIAGNOSIS — J811 Chronic pulmonary edema: Secondary | ICD-10-CM | POA: Diagnosis not present

## 2023-07-26 DIAGNOSIS — I5082 Biventricular heart failure: Secondary | ICD-10-CM

## 2023-07-26 DIAGNOSIS — R0602 Shortness of breath: Secondary | ICD-10-CM | POA: Diagnosis not present

## 2023-07-26 DIAGNOSIS — I5021 Acute systolic (congestive) heart failure: Secondary | ICD-10-CM | POA: Diagnosis not present

## 2023-07-26 DIAGNOSIS — I272 Pulmonary hypertension, unspecified: Secondary | ICD-10-CM | POA: Diagnosis not present

## 2023-07-26 LAB — COOXEMETRY PANEL
Carboxyhemoglobin: 0.4 % — ABNORMAL LOW (ref 0.5–1.5)
Carboxyhemoglobin: 1 % (ref 0.5–1.5)
Methemoglobin: <0.7 % (ref 0.0–1.5)
Methemoglobin: <0.7 % (ref 0.0–1.5)
O2 Saturation: 53.9 %
O2 Saturation: 54 %
Total hemoglobin: 16 g/dL (ref 12.0–16.0)
Total hemoglobin: 17.3 g/dL — ABNORMAL HIGH (ref 12.0–16.0)

## 2023-07-26 LAB — CG4 I-STAT (LACTIC ACID)
Lactic Acid, Venous: 0.7 mmol/L (ref 0.5–1.9)
Lactic Acid, Venous: 0.6 mmol/L (ref 0.5–1.9)

## 2023-07-26 MED ORDER — FUROSEMIDE 10 MG/ML IJ SOLN: 40.0000 mg | Freq: Once | INTRAMUSCULAR | Status: DC

## 2023-07-26 MED ORDER — POTASSIUM CHLORIDE CRYS ER 20 MEQ PO TBCR
20.0000 meq | EXTENDED_RELEASE_TABLET | Freq: Once | ORAL | Status: AC
  Administered 2023-07-26: 20 meq via ORAL
  Filled 2023-07-26: qty 1

## 2023-07-26 MED ORDER — FUROSEMIDE 10 MG/ML IJ SOLN
60.0000 mg | Freq: Once | INTRAMUSCULAR | Status: AC
  Administered 2023-07-26: 60 mg via INTRAVENOUS
  Filled 2023-07-26: qty 6

## 2023-07-26 MED ORDER — POTASSIUM CHLORIDE CRYS ER 20 MEQ PO TBCR: 40.0000 meq | EXTENDED_RELEASE_TABLET | Freq: Once | ORAL | Status: DC

## 2023-07-26 NOTE — Evaluation (Addendum)
Physical Therapy Evaluation Patient Details Name: Joel York MRN: [id number] DOB: 05/19/1986 Today's Date: 07/26/2023  History of Present Illness  Joel York is a 37 y.o. male admitted with acute systolic heart failure on 7/22. LVAD implant planned for Wednesday 8/7. PMH: HTN, ADD, asthma, hx drug abuse (now on suboxone x5 yrs), and seizures.   Clinical Impression  PT asked to complete 6 min walk test prior to LVAD implant planned for Wednesday 8/7. Pt amb 4 loops around the unit which is 1466fot in 6 min without stopping. HR up to 140s. Pt denies significant SOB. Pt encouraged to ambulate with spouse a couple times a day until LVAD. PT to return to re-assess pt s/p LVAD placement once re-ordered.         If plan is discharge home, recommend the following:     Can travel by private vehicle        Equipment Recommendations None recommended by PT  Recommendations for Other Services       Functional Status Assessment       Precautions / Restrictions Precautions Precautions: Other (comment) Precaution Comments: watch HR Restrictions Weight Bearing Restrictions: No      Mobility  Bed Mobility Overal bed mobility: Independent                  Transfers Overall transfer level: Independent Equipment used: None               General transfer comment: no difficulty    Ambulation/Gait Ambulation/Gait assistance: Independent (assist for line management only) Gait Distance (Feet): 1480 Feet Assistive device: None Gait Pattern/deviations: WFL(Within Functional Limits) Gait velocity: quick Gait velocity interpretation: >4.37 ft/sec, indicative of normal walking speed   General Gait Details: HR increased to 140s during amb, pt ambulated 1480' in the 6 min walk test  Stairs            Wheelchair Mobility     Tilt Bed    Modified Rankin (Stroke Patients Only)       Balance Overall balance assessment: No apparent balance deficits (not formally  assessed)                                           Pertinent Vitals/Pain Pain Assessment Pain Assessment: No/denies pain    Home Living Family/patient expects to be discharged to:: Private residence Living Arrangements: Spouse/significant other Available Help at Discharge: Family;Available 24 hours/day Type of Home: House Home Access: Stairs to enter Entrance Stairs-Rails: Right Entrance Stairs-Number of Steps: 4 Alternate Level Stairs-Number of Steps: flight Home Layout: Two level Home Equipment: None      Prior Function Prior Level of Function : Independent/Modified Independent             Mobility Comments: worked as a Presenter, broadcasting ADLs Comments: indep     Higher education careers adviser   Dominant Hand: Right    Extremity/Trunk Assessment   Upper Extremity Assessment Upper Extremity Assessment: Overall WFL for tasks assessed    Lower Extremity Assessment Lower Extremity Assessment: Overall WFL for tasks assessed    Cervical / Trunk Assessment Cervical / Trunk Assessment: Normal  Communication   Communication: No difficulties  Cognition Arousal/Alertness: Awake/alert Behavior During Therapy: WFL for tasks assessed/performed Overall Cognitive Status: Within Functional Limits for tasks assessed  General Comments General comments (skin integrity, edema, etc.): HR above 120bpm up to 140s with amb    Exercises     Assessment/Plan    PT Assessment Patient needs continued PT services (post LVAD placement)  PT Problem List Decreased activity tolerance;Decreased mobility       PT Treatment Interventions DME instruction;Stair training;Functional mobility training;Therapeutic activities;Therapeutic exercise;Gait training;Balance training    PT Goals (Current goals can be found in the Care Plan section)  Acute Rehab PT Goals Patient Stated Goal: to get LVAD and start feeling better PT  Goal Formulation: With patient Time For Goal Achievement: 08/09/23 Potential to Achieve Goals: Good Additional Goals Additional Goal #1: pt to amb 1857ft in 6 minutes with supervision and least restrictive device.    Frequency Min 1X/week     Co-evaluation               AM-PAC PT "6 Clicks" Mobility  Outcome Measure Help needed turning from your back to your side while in a flat bed without using bedrails?: None Help needed moving from lying on your back to sitting on the side of a flat bed without using bedrails?: None Help needed moving to and from a bed to a chair (including a wheelchair)?: None Help needed standing up from a chair using your arms (e.g., wheelchair or bedside chair)?: None Help needed to walk in hospital room?: None Help needed climbing 3-5 steps with a railing? : None 6 Click Score: 24    End of Session   Activity Tolerance: Patient tolerated treatment well Patient left: in chair;with call bell/phone within reach;with family/visitor present Nurse Communication: Mobility status PT Visit Diagnosis: Difficulty in walking, not elsewhere classified (R26.2)    Time: 1610-9604 PT Time Calculation (min) (ACUTE ONLY): 35 min   Charges:   PT Evaluation $PT Eval Low Complexity: 1 Low PT Treatments $Physical Performance Test: 8-22 mins PT General Charges $$ ACUTE PT VISIT: 1 Visit         Lewis Shock, PT, DPT Acute Rehabilitation Services Secure chat preferred Office #: [phone]   Iona Hansen 07/26/2023, 3:26 PM

## 2023-07-26 NOTE — Plan of Care (Signed)
  Problem: Education: Goal: Knowledge of General Education information will improve Description: Including pain rating scale, medication(s)/side effects and non-pharmacologic comfort measures Outcome: Progressing   Problem: Health Behavior/Discharge Planning: Goal: Ability to manage health-related needs will improve Outcome: Progressing   Problem: Clinical Measurements: Goal: Ability to maintain clinical measurements within normal limits will improve Outcome: Progressing Goal: Will remain free from infection Outcome: Progressing Goal: Diagnostic test results will improve Outcome: Progressing Goal: Respiratory complications will improve Outcome: Progressing Goal: Cardiovascular complication will be avoided Outcome: Progressing   Problem: Activity: Goal: Risk for activity intolerance will decrease Outcome: Progressing   Problem: Nutrition: Goal: Adequate nutrition will be maintained Outcome: Progressing   Problem: Coping: Goal: Level of anxiety will decrease Outcome: Progressing   Problem: Elimination: Goal: Will not experience complications related to bowel motility Outcome: Progressing Goal: Will not experience complications related to urinary retention Outcome: Progressing   Problem: Pain Managment: Goal: General experience of comfort will improve Outcome: Progressing   Problem: Safety: Goal: Ability to remain free from injury will improve Outcome: Progressing   Problem: Skin Integrity: Goal: Risk for impaired skin integrity will decrease Outcome: Progressing   Problem: Education: Goal: Ability to demonstrate management of disease process will improve Outcome: Progressing Goal: Ability to verbalize understanding of medication therapies will improve Outcome: Progressing   Problem: Activity: Goal: Capacity to carry out activities will improve Outcome: Progressing   Problem: Cardiac: Goal: Ability to achieve and maintain adequate cardiopulmonary perfusion  will improve Outcome: Progressing   Problem: Activity: Goal: Ability to return to baseline activity level will improve Outcome: Progressing   Problem: Cardiovascular: Goal: Ability to achieve and maintain adequate cardiovascular perfusion will improve Outcome: Progressing Goal: Vascular access site(s) Level 0-1 will be maintained Outcome: Progressing   Problem: Health Behavior/Discharge Planning: Goal: Ability to safely manage health-related needs after discharge will improve Outcome: Progressing

## 2023-07-26 NOTE — Progress Notes (Signed)
Advanced Heart Failure Rounding Note  PCP-Cardiologist: None   Subjective:    07/23: Initial CO-OX 30%. Started on milrinone 0.25 + NE.  7/25: Milrinone increased to 0.375  Interval hx:  - This AM, became very SOB, nauseas and anxious. "I feel like I did before".   Objective:   Weight Range: 68.9 kg Body mass index is 23.79 kg/m.   Vital Signs:   Temp:  [97.9 F (36.6 C)-98.4 F (36.9 C)] 98.3 F (36.8 C) (08/03 0852) Pulse Rate:  [92-222] 94 (08/03 0600) BP: (98-125)/(67-85) 103/67 (08/03 0600) SpO2:  [90 %-98 %] 94 % (08/03 0600) Weight:  [68.9 kg] 68.9 kg (08/03 0500) Last BM Date : 07/25/23  Weight change: Filed Weights   07/24/23 0500 07/25/23 0500 07/26/23 0500  Weight: 67.4 kg 68.3 kg 68.9 kg   Intake/Output:   Intake/Output Summary (Last 24 hours) at 07/26/2023 1053 Last data filed at 07/26/2023 1043 Gross per 24 hour  Intake 366.2 ml  Output 4045 ml  Net -3678.8 ml     Physical Exam   CVP 5  General:  Well appearing, sitting up in chair. No respiratory difficulty HEENT: normal Neck: supple. no JVD. Carotids 2+ bilat; no bruits. No lymphadenopathy or thyromegaly appreciated. Cor: PMI nondisplaced. Regular rate & rhythm. No rubs, gallops or murmurs. Lungs: clear Abdomen: soft, nontender, nondistended. No hepatosplenomegaly. No bruits or masses. Good bowel sounds. Extremities: no cyanosis, clubbing, rash, edema +RUE PICC  Neuro: alert & oriented x 3, cranial nerves grossly intact. moves all 4 extremities w/o difficulty. Affect pleasant.  Telemetry   Sinus tach 120s  Labs    CBC Recent Labs    07/25/23 0358 07/26/23 0428  WBC 8.7 6.8  NEUTROABS 4.9 3.5  HGB 16.0 15.3  HCT 48.6 46.4  MCV 91.4 90.6  PLT 164 186   Basic Metabolic Panel Recent Labs    16/10/96 0358 07/26/23 0428  NA 136 135  K 4.2 3.9  CL 98 105  CO2 28 24  GLUCOSE 148* 198*  BUN 18 18  CREATININE 1.29* 1.17  CALCIUM 8.4* 8.0*  MG 1.8 2.1    BNP (last 3  results) Recent Labs    07/14/23 0101  BNP 1,810.8*  2,494.6*    Imaging   DG CHEST PORT 1 VIEW  Result Date: 07/26/2023 CLINICAL DATA:  Shortness of breath EXAM: PORTABLE CHEST 1 VIEW COMPARISON:  07/14/2023 FINDINGS: Right arm PICC tip in the SVC 3 cm above the right atrium. Chronically enlarged cardiac silhouette. Pulmonary venous hypertension, with early interstitial edema. No alveolar edema. No infiltrate, collapse or effusion. IMPRESSION: Chronic cardiomegaly. Pulmonary venous hypertension and early interstitial edema. Electronically Signed   By: Paulina Fusi M.D.   On: 07/26/2023 10:25   Medications:   Scheduled Medications:  buprenorphine-naloxone  1 tablet Sublingual Daily   Chlorhexidine Gluconate Cloth  6 each Topical Daily   digoxin  0.125 mg Oral Daily   enoxaparin (LOVENOX) injection  40 mg Subcutaneous Q24H   ipratropium  1 spray Each Nare BID   loratadine  10 mg Oral QPM   melatonin  3 mg Oral QHS   mexiletine  200 mg Oral BID   mometasone-formoterol  2 puff Inhalation BID   montelukast  10 mg Oral q morning   mupirocin ointment  1 Application Nasal BID   polyethylene glycol  17 g Oral Daily   potassium chloride  20 mEq Oral Once   senna-docusate  2 tablet Oral QHS  sodium chloride flush  10-40 mL Intracatheter Q12H   sodium chloride flush  3 mL Intravenous Q12H   sodium chloride flush  3 mL Intravenous Q12H    Infusions:  sodium chloride     sodium chloride 10 mL/hr at 07/26/23 0600   milrinone 0.375 mcg/kg/min (07/26/23 0600)   norepinephrine (LEVOPHED) Adult infusion Stopped (07/24/23 0242)    PRN Medications: sodium chloride, sodium chloride, acetaminophen, acetaminophen, acetaminophen, alum & mag hydroxide-simeth, fentaNYL (SUBLIMAZE) injection, HYDROcodone-acetaminophen, levalbuterol, LORazepam, nitroGLYCERIN, ondansetron (ZOFRAN) IV, mouth rinse, sodium chloride flush, sodium chloride flush, sodium chloride flush   R/LHC 07/24: On milrinone 0.25  and NE 2    Ao = 97/70 (80) LV = 112/31 RA = 7 RV = 43/12 PA = 42/21 (30) PCW = 21 (v = 25) Fick cardiac output/index = 3.3/1.9 PVR = 2.7 WU SVR = 1778 FA sat = 95% PA sat = 67%, 67% PAPi = 3.0  Patient Profile   Joel York is a 37 y.o. male with HTN, ADD, asthma, hx drug abuse (now on suboxone x5 yrs), and seizures. Admitted with acute systolic heart failure.   Assessment/Plan  Acute biventricular systolic heart failure >> cardiogenic shock - Echo EF <20%,  RV mildly reduced, RV mod enlarged, estimated RV systolic pressure 53.72mmHg, LA mod dilated, RA mod dilated, mild-mod MR/TR - Etiology uncertain. ? 2/2 PVCs vs genetically mediated +/- hypertension.  - LHC with normal coronaries. RHC 07/24: Low CO (Fick CO/CI 3.3/1.9) and PAPi 3.0 on milrinone 0.25 + NE 2 - cMRI:  LV markedly dilated EF 10% RV 17% NICM - Limited echo 07/29: LVEF < 20%, RV function improved on Dr. Gasper Lloyd review  -This morning patient increasingly dyspneic, anxious and nauseous.  CVP 10-12, blood pressure 120s over 90s, tachycardic to 120s on milrinone 0.375 mcg/kg/min.  Mixed venous of 54 with Hgb of 15 consistent with cardiac index of 1.6 L/min/m2. Lactic acid 0.7. Bedside TTE w/ LVEF 10%, severe functional MR and moderately reduced RV function, IVC 2.3cm.  - IV lasix 60mg  x 1. Continue to trend mixed venous and lactic acid. If symptoms and mixed venous do not improve will consider escalation to MCS.   Hypertension - stable  Chest pressure - HsTrop 39>43>37>40, d/t demand ischemia - No CAD on cath  PVCs - snores, will need eventual sleep study  - Suppressed with Mexiletine 200 mg BID - TSH 4.9, T4 0.79 - Keep K>4 and Mg >2  AKI - resolved.  Tobacco use - smokes 3-6 cigarettes / day - cessation discussed  Hx drug abuse - Continue suboxone  Elevated LFTs - In setting of low-output HF, improved  9. Hypokalemia - resolved, K 4.2 today    [name], DO Advanced Heart  Failure 10:53 AM  CRITICAL CARE Performed by: Dorthula Nettles   Total critical care time: 45 minutes  Critical care time was exclusive of separately billable procedures and treating other patients.  Critical care was necessary to treat or prevent imminent or life-threatening deterioration.  Critical care was time spent personally by me on the following activities: development of treatment plan with patient and/or surrogate as well as nursing, discussions with consultants, evaluation of patient's response to treatment, examination of patient, obtaining history from patient or surrogate, ordering and performing treatments and interventions, ordering and review of laboratory studies, ordering and review of radiographic studies, pulse oximetry and re-evaluation of patient's condition.

## 2023-07-27 DIAGNOSIS — I5082 Biventricular heart failure: Secondary | ICD-10-CM | POA: Diagnosis not present

## 2023-07-27 DIAGNOSIS — I5021 Acute systolic (congestive) heart failure: Secondary | ICD-10-CM | POA: Diagnosis not present

## 2023-07-27 LAB — CBC WITH DIFFERENTIAL/PLATELET
Abs Immature Granulocytes: 0.01 10*3/uL (ref 0.00–0.07)
Basophils Absolute: 0.1 10*3/uL (ref 0.0–0.1)
Basophils Relative: 1 %
Eosinophils Absolute: 0.3 10*3/uL (ref 0.0–0.5)
Eosinophils Relative: 5 %
HCT: 44.8 % (ref 39.0–52.0)
Hemoglobin: 14.4 g/dL (ref 13.0–17.0)
Immature Granulocytes: 0 %
Lymphocytes Relative: 39 %
Lymphs Abs: 2.5 10*3/uL (ref 0.7–4.0)
MCH: 28.6 pg (ref 26.0–34.0)
MCHC: 32.1 g/dL (ref 30.0–36.0)
MCV: 89.1 fL (ref 80.0–100.0)
Monocytes Absolute: 0.7 10*3/uL (ref 0.1–1.0)
Monocytes Relative: 11 %
Neutro Abs: 2.8 10*3/uL (ref 1.7–7.7)
Neutrophils Relative %: 44 %
Platelets: 200 10*3/uL (ref 150–400)
RBC: 5.03 MIL/uL (ref 4.22–5.81)
RDW: 13.7 % (ref 11.5–15.5)
WBC: 6.3 10*3/uL (ref 4.0–10.5)
nRBC: 0 % (ref 0.0–0.2)

## 2023-07-27 LAB — BASIC METABOLIC PANEL WITH GFR
Anion gap: 9 (ref 5–15)
BUN: 23 mg/dL — ABNORMAL HIGH (ref 6–20)
CO2: 26 mmol/L (ref 22–32)
Calcium: 8.6 mg/dL — ABNORMAL LOW (ref 8.9–10.3)
Chloride: 102 mmol/L (ref 98–111)
Creatinine, Ser: 1.35 mg/dL — ABNORMAL HIGH (ref 0.61–1.24)
GFR, Estimated: >60 mL/min (ref >60)
Glucose, Bld: 159 mg/dL — ABNORMAL HIGH (ref 70–99)
Potassium: 3.9 mmol/L (ref 3.5–5.1)
Sodium: 137 mmol/L (ref 135–145)

## 2023-07-27 LAB — COOXEMETRY PANEL
Carboxyhemoglobin: 1.5 % (ref 0.5–1.5)
Methemoglobin: <0.7 % (ref 0.0–1.5)
O2 Saturation: 64.1 %
Total hemoglobin: 14.8 g/dL (ref 12.0–16.0)

## 2023-07-27 LAB — MAGNESIUM: Magnesium: 1.9 mg/dL (ref 1.7–2.4)

## 2023-07-27 MED ORDER — MAGNESIUM SULFATE 2 GM/50ML IV SOLN
2.0000 g | Freq: Once | INTRAVENOUS | Status: AC
  Administered 2023-07-27: 2 g via INTRAVENOUS
  Filled 2023-07-27: qty 50

## 2023-07-27 MED ORDER — SODIUM CHLORIDE 0.9 % IV SOLN: INTRAVENOUS | Status: DC

## 2023-07-27 MED ORDER — FUROSEMIDE 10 MG/ML IJ SOLN
20.0000 mg | Freq: Once | INTRAMUSCULAR | Status: AC
  Administered 2023-07-27: 20 mg via INTRAVENOUS
  Filled 2023-07-27: qty 2

## 2023-07-27 MED ORDER — POTASSIUM CHLORIDE CRYS ER 20 MEQ PO TBCR
20.0000 meq | EXTENDED_RELEASE_TABLET | Freq: Once | ORAL | Status: AC
  Administered 2023-07-27: 20 meq via ORAL
  Filled 2023-07-27: qty 1

## 2023-07-27 MED ORDER — FLUTICASONE PROPIONATE 50 MCG/ACT NA SUSP: 1.0000 | Freq: Every day | NASAL | Status: DC | PRN

## 2023-07-27 MED ORDER — FLUTICASONE PROPIONATE 50 MCG/ACT NA SUSP
1.0000 | Freq: Two times a day (BID) | NASAL | Status: DC | PRN
  Administered 2023-07-27 – 2023-07-29 (×3): 1 via NASAL
  Filled 2023-07-27: qty 16

## 2023-07-27 NOTE — H&P (View-Only) (Signed)
Advanced Heart Failure Rounding Note  PCP-Cardiologist: None   Subjective:    07/23: Initial CO-OX 30%. Started on milrinone 0.25 + NE.  7/25: Milrinone increased to 0.375  Interval hx:  - Reports significant improvement in dyspnea, chest discomfort and anxiety after diuresing >3L yesterday.   Objective:   Weight Range: 68.7 kg Body mass index is 23.72 kg/m.   Vital Signs:   Temp:  [97.8 F (36.6 C)-98.5 F (36.9 C)] 98 F (36.7 C) (08/04 0800) Pulse Rate:  [71-133] 120 (08/04 1022) Resp:  [16-18] 18 (08/03 1600) BP: (104-123)/(66-100) 110/82 (08/04 0700) SpO2:  [90 %-98 %] 93 % (08/04 0846) Weight:  [68.7 kg] 68.7 kg (08/04 0500) Last BM Date : 07/26/23  Weight change: Filed Weights   07/25/23 0500 07/26/23 0500 07/27/23 0500  Weight: 68.3 kg 68.9 kg 68.7 kg   Intake/Output:   Intake/Output Summary (Last 24 hours) at 07/27/2023 1023 Last data filed at 07/27/2023 0857 Gross per 24 hour  Intake 1046.07 ml  Output 3325 ml  Net -2278.93 ml     Physical Exam   CVP 10 General:  Well appearing, sitting up in chair. No respiratory difficulty HEENT: normal Neck: supple. JVP 8cm Carotids 2+ bilat; no bruits. No lymphadenopathy or thyromegaly appreciated. Cor: PMI nondisplaced. Regular rate & rhythm. No rubs, gallops or murmurs. Lungs: clear Abdomen: soft, nontender, nondistended. No hepatosplenomegaly. No bruits or masses. Good bowel sounds. Extremities: no cyanosis, clubbing, rash, edema +RUE PICC  Neuro: alert & oriented x 3, cranial nerves grossly intact. moves all 4 extremities w/o difficulty. Affect pleasant.  Telemetry   Sinus tach 110-120  Labs    CBC Recent Labs    07/26/23 0428 07/27/23 0404  WBC 6.8 6.3  NEUTROABS 3.5 2.8  HGB 15.3 14.4  HCT 46.4 44.8  MCV 90.6 89.1  PLT 186 200   Basic Metabolic Panel Recent Labs    95/28/41 0428 07/27/23 0404  NA 135 137  K 3.9 3.9  CL 105 102  CO2 24 26  GLUCOSE 198* 159*  BUN 18 23*   CREATININE 1.17 1.35*  CALCIUM 8.0* 8.6*  MG 2.1 1.9    BNP (last 3 results) Recent Labs    07/14/23 0101  BNP 1,810.8*  2,494.6*    Imaging   No results found. Medications:   Scheduled Medications:  buprenorphine-naloxone  1 tablet Sublingual Daily   Chlorhexidine Gluconate Cloth  6 each Topical Daily   digoxin  0.125 mg Oral Daily   enoxaparin (LOVENOX) injection  40 mg Subcutaneous Q24H   ipratropium  1 spray Each Nare BID   loratadine  10 mg Oral QPM   melatonin  3 mg Oral QHS   mexiletine  200 mg Oral BID   mometasone-formoterol  2 puff Inhalation BID   montelukast  10 mg Oral q morning   mupirocin ointment  1 Application Nasal BID   polyethylene glycol  17 g Oral Daily   senna-docusate  2 tablet Oral QHS   sodium chloride flush  10-40 mL Intracatheter Q12H   sodium chloride flush  3 mL Intravenous Q12H   sodium chloride flush  3 mL Intravenous Q12H    Infusions:  sodium chloride     sodium chloride Stopped (07/26/23 1024)   milrinone 0.375 mcg/kg/min (07/27/23 0700)   norepinephrine (LEVOPHED) Adult infusion Stopped (07/24/23 0242)    PRN Medications: sodium chloride, sodium chloride, acetaminophen, acetaminophen, acetaminophen, alum & mag hydroxide-simeth, fentaNYL (SUBLIMAZE) injection, HYDROcodone-acetaminophen, levalbuterol, LORazepam, nitroGLYCERIN, ondansetron (  ZOFRAN) IV, mouth rinse, sodium chloride flush, sodium chloride flush, sodium chloride flush   R/LHC 07/24: On milrinone 0.25 and NE 2    Ao = 97/70 (80) LV = 112/31 RA = 7 RV = 43/12 PA = 42/21 (30) PCW = 21 (v = 25) Fick cardiac output/index = 3.3/1.9 PVR = 2.7 WU SVR = 1778 FA sat = 95% PA sat = 67%, 67% PAPi = 3.0  Patient Profile   Joel York is a 37 y.o. male with HTN, ADD, asthma, hx drug abuse (now on suboxone x5 yrs), and seizures. Admitted with acute systolic heart failure.   Assessment/Plan  Acute biventricular systolic heart failure >> cardiogenic shock - Echo EF  <20%,  RV mildly reduced, RV mod enlarged, estimated RV systolic pressure 53.29mmHg, LA mod dilated, RA mod dilated, mild-mod MR/TR - Etiology uncertain. ? 2/2 PVCs vs genetically mediated +/- hypertension.  - LHC with normal coronaries. RHC 07/24: Low CO (Fick CO/CI 3.3/1.9) and PAPi 3.0 on milrinone 0.25 + NE 2 - cMRI:  LV markedly dilated EF 10% RV 17% NICM - Limited echo 07/29: LVEF < 20%, RV function improved. -Significant improvement today in symptoms of dyspnea/chest discomfort. Diuresed 3L yesterday.  - CVP 10. IV lasix 20mg  today. Plan for RHC tomorrow. Low threshold for placement of RV MCS.   Hypertension - stable  Chest pressure - HsTrop 39>43>37>40, d/t demand ischemia - No CAD on cath  PVCs - snores, will need eventual sleep study  - Suppressed with Mexiletine 200 mg BID - TSH 4.9, T4 0.79 - Keep K>4 and Mg >2  AKI - resolved.  Tobacco use - smokes 3-6 cigarettes / day - cessation discussed  Hx drug abuse - Continue suboxone  Elevated LFTs - In setting of low-output HF, improved  9. Hypokalemia - stable   Breslyn Abdo, DO Advanced Heart Failure 10:23 AM  CRITICAL CARE Performed by: Dorthula Nettles   Total critical care time: 30 minutes  Critical care time was exclusive of separately billable procedures and treating other patients.  Critical care was necessary to treat or prevent imminent or life-threatening deterioration.  Critical care was time spent personally by me on the following activities: development of treatment plan with patient and/or surrogate as well as nursing, discussions with consultants, evaluation of patient's response to treatment, examination of patient, obtaining history from patient or surrogate, ordering and performing treatments and interventions, ordering and review of laboratory studies, ordering and review of radiographic studies, pulse oximetry and re-evaluation of patient's condition.

## 2023-07-27 NOTE — Progress Notes (Signed)
Advanced Heart Failure Rounding Note  PCP-Cardiologist: None   Subjective:    07/23: Initial CO-OX 30%. Started on milrinone 0.25 + NE.  7/25: Milrinone increased to 0.375  Interval hx:  - Reports significant improvement in dyspnea, chest discomfort and anxiety after diuresing >3L yesterday.   Objective:   Weight Range: 68.7 kg Body mass index is 23.72 kg/m.   Vital Signs:   Temp:  [97.8 F (36.6 C)-98.5 F (36.9 C)] 98 F (36.7 C) (08/04 0800) Pulse Rate:  [71-133] 120 (08/04 1022) Resp:  [16-18] 18 (08/03 1600) BP: (104-123)/(66-100) 110/82 (08/04 0700) SpO2:  [90 %-98 %] 93 % (08/04 0846) Weight:  [68.7 kg] 68.7 kg (08/04 0500) Last BM Date : 07/26/23  Weight change: Filed Weights   07/25/23 0500 07/26/23 0500 07/27/23 0500  Weight: 68.3 kg 68.9 kg 68.7 kg   Intake/Output:   Intake/Output Summary (Last 24 hours) at 07/27/2023 1023 Last data filed at 07/27/2023 0857 Gross per 24 hour  Intake 1046.07 ml  Output 3325 ml  Net -2278.93 ml     Physical Exam   CVP 10 General:  Well appearing, sitting up in chair. No respiratory difficulty HEENT: normal Neck: supple. JVP 8cm Carotids 2+ bilat; no bruits. No lymphadenopathy or thyromegaly appreciated. Cor: PMI nondisplaced. Regular rate & rhythm. No rubs, gallops or murmurs. Lungs: clear Abdomen: soft, nontender, nondistended. No hepatosplenomegaly. No bruits or masses. Good bowel sounds. Extremities: no cyanosis, clubbing, rash, edema +RUE PICC  Neuro: alert & oriented x 3, cranial nerves grossly intact. moves all 4 extremities w/o difficulty. Affect pleasant.  Telemetry   Sinus tach 110-120  Labs    CBC Recent Labs    07/26/23 0428 07/27/23 0404  WBC 6.8 6.3  NEUTROABS 3.5 2.8  HGB 15.3 14.4  HCT 46.4 44.8  MCV 90.6 89.1  PLT 186 200   Basic Metabolic Panel Recent Labs    95/28/41 0428 07/27/23 0404  NA 135 137  K 3.9 3.9  CL 105 102  CO2 24 26  GLUCOSE 198* 159*  BUN 18 23*   CREATININE 1.17 1.35*  CALCIUM 8.0* 8.6*  MG 2.1 1.9    BNP (last 3 results) Recent Labs    07/14/23 0101  BNP 1,810.8*  2,494.6*    Imaging   No results found. Medications:   Scheduled Medications:  buprenorphine-naloxone  1 tablet Sublingual Daily   Chlorhexidine Gluconate Cloth  6 each Topical Daily   digoxin  0.125 mg Oral Daily   enoxaparin (LOVENOX) injection  40 mg Subcutaneous Q24H   ipratropium  1 spray Each Nare BID   loratadine  10 mg Oral QPM   melatonin  3 mg Oral QHS   mexiletine  200 mg Oral BID   mometasone-formoterol  2 puff Inhalation BID   montelukast  10 mg Oral q morning   mupirocin ointment  1 Application Nasal BID   polyethylene glycol  17 g Oral Daily   senna-docusate  2 tablet Oral QHS   sodium chloride flush  10-40 mL Intracatheter Q12H   sodium chloride flush  3 mL Intravenous Q12H   sodium chloride flush  3 mL Intravenous Q12H    Infusions:  sodium chloride     sodium chloride Stopped (07/26/23 1024)   milrinone 0.375 mcg/kg/min (07/27/23 0700)   norepinephrine (LEVOPHED) Adult infusion Stopped (07/24/23 0242)    PRN Medications: sodium chloride, sodium chloride, acetaminophen, acetaminophen, acetaminophen, alum & mag hydroxide-simeth, fentaNYL (SUBLIMAZE) injection, HYDROcodone-acetaminophen, levalbuterol, LORazepam, nitroGLYCERIN, ondansetron (  ZOFRAN) IV, mouth rinse, sodium chloride flush, sodium chloride flush, sodium chloride flush   R/LHC 07/24: On milrinone 0.25 and NE 2    Ao = 97/70 (80) LV = 112/31 RA = 7 RV = 43/12 PA = 42/21 (30) PCW = 21 (v = 25) Fick cardiac output/index = 3.3/1.9 PVR = 2.7 WU SVR = 1778 FA sat = 95% PA sat = 67%, 67% PAPi = 3.0  Patient Profile   Joel York is a 37 y.o. male with HTN, ADD, asthma, hx drug abuse (now on suboxone x5 yrs), and seizures. Admitted with acute systolic heart failure.   Assessment/Plan  Acute biventricular systolic heart failure >> cardiogenic shock - Echo EF  <20%,  RV mildly reduced, RV mod enlarged, estimated RV systolic pressure 53.29mmHg, LA mod dilated, RA mod dilated, mild-mod MR/TR - Etiology uncertain. ? 2/2 PVCs vs genetically mediated +/- hypertension.  - LHC with normal coronaries. RHC 07/24: Low CO (Fick CO/CI 3.3/1.9) and PAPi 3.0 on milrinone 0.25 + NE 2 - cMRI:  LV markedly dilated EF 10% RV 17% NICM - Limited echo 07/29: LVEF < 20%, RV function improved. -Significant improvement today in symptoms of dyspnea/chest discomfort. Diuresed 3L yesterday.  - CVP 10. IV lasix 20mg  today. Plan for RHC tomorrow. Low threshold for placement of RV MCS.   Hypertension - stable  Chest pressure - HsTrop 39>43>37>40, d/t demand ischemia - No CAD on cath  PVCs - snores, will need eventual sleep study  - Suppressed with Mexiletine 200 mg BID - TSH 4.9, T4 0.79 - Keep K>4 and Mg >2  AKI - resolved.  Tobacco use - smokes 3-6 cigarettes / day - cessation discussed  Hx drug abuse - Continue suboxone  Elevated LFTs - In setting of low-output HF, improved  9. Hypokalemia - stable   Joel Beverlin, DO Advanced Heart Failure 10:23 AM  CRITICAL CARE Performed by: Dorthula Nettles   Total critical care time: 30 minutes  Critical care time was exclusive of separately billable procedures and treating other patients.  Critical care was necessary to treat or prevent imminent or life-threatening deterioration.  Critical care was time spent personally by me on the following activities: development of treatment plan with patient and/or surrogate as well as nursing, discussions with consultants, evaluation of patient's response to treatment, examination of patient, obtaining history from patient or surrogate, ordering and performing treatments and interventions, ordering and review of laboratory studies, ordering and review of radiographic studies, pulse oximetry and re-evaluation of patient's condition.

## 2023-07-27 NOTE — Plan of Care (Signed)
  Problem: Education: Goal: Knowledge of General Education information will improve Description: Including pain rating scale, medication(s)/side effects and non-pharmacologic comfort measures Outcome: Progressing   Problem: Health Behavior/Discharge Planning: Goal: Ability to manage health-related needs will improve Outcome: Progressing   Problem: Clinical Measurements: Goal: Ability to maintain clinical measurements within normal limits will improve Outcome: Progressing   Problem: Clinical Measurements: Goal: Ability to maintain clinical measurements within normal limits will improve Outcome: Progressing Goal: Will remain free from infection Outcome: Progressing Goal: Diagnostic test results will improve Outcome: Progressing Goal: Respiratory complications will improve Outcome: Progressing Goal: Cardiovascular complication will be avoided Outcome: Progressing   Problem: Activity: Goal: Risk for activity intolerance will decrease Outcome: Progressing   Problem: Nutrition: Goal: Adequate nutrition will be maintained Outcome: Progressing   Problem: Coping: Goal: Level of anxiety will decrease Outcome: Progressing   Problem: Elimination: Goal: Will not experience complications related to bowel motility Outcome: Completed/Met Goal: Will not experience complications related to urinary retention Outcome: Completed/Met   Problem: Pain Managment: Goal: General experience of comfort will improve Outcome: Progressing   Problem: Safety: Goal: Ability to remain free from injury will improve Outcome: Progressing   Problem: Skin Integrity: Goal: Risk for impaired skin integrity will decrease Outcome: Progressing   Problem: Education: Goal: Ability to demonstrate management of disease process will improve Outcome: Progressing Goal: Ability to verbalize understanding of medication therapies will improve Outcome: Progressing   Problem: Activity: Goal: Capacity to carry  out activities will improve Outcome: Progressing   Problem: Cardiac: Goal: Ability to achieve and maintain adequate cardiopulmonary perfusion will improve Outcome: Progressing   Problem: Activity: Goal: Ability to return to baseline activity level will improve Outcome: Progressing   Problem: Cardiovascular: Goal: Ability to achieve and maintain adequate cardiovascular perfusion will improve Outcome: Progressing Goal: Vascular access site(s) Level 0-1 will be maintained Outcome: Progressing   Problem: Health Behavior/Discharge Planning: Goal: Ability to safely manage health-related needs after discharge will improve Outcome: Progressing

## 2023-07-28 ENCOUNTER — Encounter (HOSPITAL_COMMUNITY)
Admission: EM | Disposition: A | Discharge status: Home / Self Care | Payer: Self-pay | Source: Home / Self Care | Attending: Internal Medicine

## 2023-07-28 DIAGNOSIS — I5082 Biventricular heart failure: Secondary | ICD-10-CM | POA: Diagnosis not present

## 2023-07-28 DIAGNOSIS — I5021 Acute systolic (congestive) heart failure: Secondary | ICD-10-CM | POA: Diagnosis not present

## 2023-07-28 HISTORY — PX: RIGHT HEART CATH: CATH118263

## 2023-07-28 LAB — POCT I-STAT EG7
Acid-Base Excess: 1 mmol/L (ref 0.0–2.0)
Acid-Base Excess: 1 mmol/L (ref 0.0–2.0)
Bicarbonate: 25.9 mmol/L (ref 20.0–28.0)
Bicarbonate: 26.3 mmol/L (ref 20.0–28.0)
Calcium, Ion: 1.16 mmol/L (ref 1.15–1.40)
Calcium, Ion: 1.18 mmol/L (ref 1.15–1.40)
HCT: 47 % (ref 39.0–52.0)
HCT: 47 % (ref 39.0–52.0)
Hemoglobin: 16 g/dL (ref 13.0–17.0)
Hemoglobin: 16 g/dL (ref 13.0–17.0)
O2 Saturation: 62 %
O2 Saturation: 62 %
Potassium: 4.3 mmol/L (ref 3.5–5.1)
Potassium: 4.3 mmol/L (ref 3.5–5.1)
Sodium: 141 mmol/L (ref 135–145)
Sodium: 141 mmol/L (ref 135–145)
TCO2: 27 mmol/L (ref 22–32)
TCO2: 28 mmol/L (ref 22–32)
pCO2, Ven: 42.5 mmHg — ABNORMAL LOW (ref 44–60)
pCO2, Ven: 43.4 mmHg — ABNORMAL LOW (ref 44–60)
pH, Ven: 7.39 (ref 7.25–7.43)
pH, Ven: 7.393 (ref 7.25–7.43)
pO2, Ven: 33 mmHg (ref 32–45)
pO2, Ven: 33 mmHg (ref 32–45)

## 2023-07-28 LAB — BASIC METABOLIC PANEL
Anion gap: 11 (ref 5–15)
BUN: 22 mg/dL — ABNORMAL HIGH (ref 6–20)
CO2: 22 mmol/L (ref 22–32)
Calcium: 8.6 mg/dL — ABNORMAL LOW (ref 8.9–10.3)
Chloride: 104 mmol/L (ref 98–111)
Creatinine, Ser: 1.39 mg/dL — ABNORMAL HIGH (ref 0.61–1.24)
GFR, Estimated: >60 mL/min (ref >60)
Glucose, Bld: 188 mg/dL — ABNORMAL HIGH (ref 70–99)
Potassium: 4.1 mmol/L (ref 3.5–5.1)
Sodium: 137 mmol/L (ref 135–145)

## 2023-07-28 SURGERY — RIGHT HEART CATH
Anesthesia: LOCAL

## 2023-07-28 MED ORDER — ACETAMINOPHEN 325 MG PO TABS: 650.0000 mg | ORAL_TABLET | ORAL | Status: DC | PRN

## 2023-07-28 MED ORDER — ENOXAPARIN SODIUM 40 MG/0.4ML IJ SOSY: 40.0000 mg | PREFILLED_SYRINGE | INTRAMUSCULAR | Status: DC

## 2023-07-28 MED ORDER — SODIUM CHLORIDE 0.9% FLUSH: 3.0000 mL | INTRAVENOUS | Status: DC | PRN

## 2023-07-28 MED ORDER — MIDAZOLAM HCL 2 MG/2ML IJ SOLN
INTRAMUSCULAR | Status: AC
  Filled 2023-07-28: qty 2

## 2023-07-28 MED ORDER — LIDOCAINE HCL (PF) 1 % IJ SOLN
INTRAMUSCULAR | Status: AC
  Filled 2023-07-28: qty 30

## 2023-07-28 MED ORDER — SODIUM CHLORIDE 0.9% FLUSH
3.0000 mL | Freq: Two times a day (BID) | INTRAVENOUS | Status: DC
  Administered 2023-07-28 – 2023-07-29 (×2): 3 mL via INTRAVENOUS

## 2023-07-28 MED ORDER — MIDAZOLAM HCL 2 MG/2ML IJ SOLN
INTRAMUSCULAR | Status: DC | PRN
  Administered 2023-07-28: 2 mg via INTRAVENOUS

## 2023-07-28 MED ORDER — LIDOCAINE HCL (PF) 1 % IJ SOLN
INTRAMUSCULAR | Status: DC | PRN
  Administered 2023-07-28: 2 mL

## 2023-07-28 MED ORDER — LABETALOL HCL 5 MG/ML IV SOLN: 10.0000 mg | INTRAVENOUS | Status: AC | PRN

## 2023-07-28 MED ORDER — ONDANSETRON HCL 4 MG/2ML IJ SOLN: 4.0000 mg | Freq: Four times a day (QID) | INTRAMUSCULAR | Status: DC | PRN

## 2023-07-28 MED ORDER — HYDRALAZINE HCL 20 MG/ML IJ SOLN: 10.0000 mg | INTRAMUSCULAR | Status: AC | PRN

## 2023-07-28 MED ORDER — SODIUM CHLORIDE 0.9 % IV SOLN: 250.0000 mL | INTRAVENOUS | Status: DC | PRN

## 2023-07-28 MED ORDER — MAGNESIUM SULFATE 2 GM/50ML IV SOLN
2.0000 g | Freq: Once | INTRAVENOUS | Status: AC
  Administered 2023-07-28: 2 g via INTRAVENOUS
  Filled 2023-07-28: qty 50

## 2023-07-28 MED ORDER — POTASSIUM CHLORIDE CRYS ER 20 MEQ PO TBCR
40.0000 meq | EXTENDED_RELEASE_TABLET | Freq: Once | ORAL | Status: AC
  Administered 2023-07-28: 40 meq via ORAL
  Filled 2023-07-28: qty 2

## 2023-07-28 MED ORDER — MILRINONE LACTATE IN DEXTROSE 20-5 MG/100ML-% IV SOLN
0.5000 ug/kg/min | INTRAVENOUS | Status: DC
  Administered 2023-07-28 – 2023-08-02 (×11): 0.5 ug/kg/min via INTRAVENOUS
  Filled 2023-07-28 (×12): qty 100

## 2023-07-28 SURGICAL SUPPLY — 8 items
CATH SWAN GANZ 7F STRAIGHT (CATHETERS) IMPLANT
GLIDESHEATH SLENDER 7FR .021G (SHEATH) IMPLANT
KIT HEART LEFT (KITS) ×1 IMPLANT
PACK CARDIAC CATHETERIZATION (CUSTOM PROCEDURE TRAY) ×1 IMPLANT
PROTECTION STATION PRESSURIZED (MISCELLANEOUS) ×1
STATION PROTECTION PRESSURIZED (MISCELLANEOUS) IMPLANT
TRANSDUCER W/STOPCOCK (MISCELLANEOUS) ×1 IMPLANT
TUBING ART PRESS 72 MALE/FEM (TUBING) IMPLANT

## 2023-07-28 NOTE — Progress Notes (Signed)
Advanced Heart Failure Rounding Note  PCP-Cardiologist: None   Subjective:    07/23: Initial CO-OX 30%. Started on milrinone 0.25 + NE.  7/25: Milrinone increased to 0.375  Feels ok. Denies CP or SOB. Anxious about VAD. Mouth pain improved.  RHC today on milrinone 0.375 RA = 6 RV = 54/11 PA = 53/33 (40) PCW = 17 Fick cardiac output/index = 4.2/2.3 PVR = 5.5 PAPi = 3.3 Ao sat = 91% PA sat = 62%, 62%  Assessment: 1. Moderately reduced CO despite milrinone at 0.375 2. Moderate predominantly pre-capillary PAH with elevated PVR 3. PAPi 3.3   Objective:   Weight Range: 66.8 kg Body mass index is 23.07 kg/m.   Vital Signs:   Temp:  [98.2 F (36.8 C)] 98.2 F (36.8 C) (08/05 0000) Pulse Rate:  [93-126] 107 (08/05 0908) Resp:  [8-18] 18 (08/05 0908) BP: (87-134)/(51-90) 109/76 (08/05 0908) SpO2:  [87 %-98 %] 87 % (08/05 0908) Weight:  [66.8 kg] 66.8 kg (08/05 0500) Last BM Date : 07/27/23  Weight change: Filed Weights   07/26/23 0500 07/27/23 0500 07/28/23 0500  Weight: 68.9 kg 68.7 kg 66.8 kg   Intake/Output:   Intake/Output Summary (Last 24 hours) at 07/28/2023 0917 Last data filed at 07/28/2023 0700 Gross per 24 hour  Intake 1580.69 ml  Output 1285 ml  Net 295.69 ml     Physical Exam   General:  Sitting up No resp difficulty HEENT: normal Neck: supple. JVP 7. Carotids 2+ bilat; no bruits. No lymphadenopathy or thryomegaly appreciated. Cor: Regular tachy + s3 Lungs: clear Abdomen: soft, nontender, nondistended. No hepatosplenomegaly. No bruits or masses. Good bowel sounds. Extremities: no cyanosis, clubbing, rash, edema Neuro: alert & orientedx3, cranial nerves grossly intact. moves all 4 extremities w/o difficulty. Affect pleasant   Telemetry   Sinus tach 110-120 Personally reviewed  Labs    CBC Recent Labs    07/27/23 0404 07/28/23 0320  WBC 6.3 6.1  NEUTROABS 2.8 2.6  HGB 14.4 14.4  HCT 44.8 44.1  MCV 89.1 89.1  PLT 200 199    Basic Metabolic Panel Recent Labs    09/81/19 0404 07/28/23 0320  NA 137 136  K 3.9 3.6  CL 102 102  CO2 26 26  GLUCOSE 159* 127*  BUN 23* 18  CREATININE 1.35* 1.25*  CALCIUM 8.6* 8.2*  MG 1.9 1.9    BNP (last 3 results) Recent Labs    07/14/23 0101  BNP 1,810.8*  2,494.6*    Imaging   CARDIAC CATHETERIZATION  Result Date: 07/28/2023 Findings: On milrinone 0.375 RA = 6 RV = 54/11 PA = 53/33 (40) PCW = 17 Fick cardiac output/index = 4.2/2.3 PVR = 5.5 PAPi = 3.3 Ao sat = 91% PA sat = 62%, 62% Assessment: 1. Moderately reduced CO despite milrinone at 0.375 2. Moderate predominantly pre-capillary PAH with elevated PVR 3. PAPi 3.3 Plan/Discussion: Will increase milrinone to 0.5. Will discuss R-sided Impella support pre-VAD with surgical team. [name], MD 9:17 AM  Medications:   Scheduled Medications:  [MAR Hold] buprenorphine-naloxone  1 tablet Sublingual Daily   [MAR Hold] Chlorhexidine Gluconate Cloth  6 each Topical Daily   [MAR Hold] digoxin  0.125 mg Oral Daily   [MAR Hold] enoxaparin (LOVENOX) injection  40 mg Subcutaneous Q24H   [MAR Hold] loratadine  10 mg Oral QPM   [MAR Hold] melatonin  3 mg Oral QHS   [MAR Hold] mexiletine  200 mg Oral BID   [MAR Hold] mometasone-formoterol  2 puff Inhalation BID   [MAR Hold] montelukast  10 mg Oral q morning   [MAR Hold] polyethylene glycol  17 g Oral Daily   [MAR Hold] sodium chloride flush  10-40 mL Intracatheter Q12H   [MAR Hold] sodium chloride flush  3 mL Intravenous Q12H   [MAR Hold] sodium chloride flush  3 mL Intravenous Q12H    Infusions:  [MAR Hold] sodium chloride     [MAR Hold] sodium chloride Stopped (07/28/23 0650)   sodium chloride 10 mL/hr at 07/28/23 0700   milrinone 0.375 mcg/kg/min (07/28/23 0700)    PRN Medications: [MAR Hold] sodium chloride, [MAR Hold] sodium chloride, [MAR Hold] acetaminophen, [MAR Hold] acetaminophen, [MAR Hold] acetaminophen, [MAR Hold] alum & mag hydroxide-simeth, [MAR  Hold] fentaNYL (SUBLIMAZE) injection, [MAR Hold] fluticasone, [MAR Hold] HYDROcodone-acetaminophen, [MAR Hold] levalbuterol, lidocaine (PF), [MAR Hold] LORazepam, midazolam, [MAR Hold] nitroGLYCERIN, [MAR Hold] ondansetron (ZOFRAN) IV, [MAR Hold] mouth rinse, [MAR Hold] sodium chloride flush, [MAR Hold] sodium chloride flush, [MAR Hold] sodium chloride flush   R/LHC 07/24: On milrinone 0.25 and NE 2    Ao = 97/70 (80) LV = 112/31 RA = 7 RV = 43/12 PA = 42/21 (30) PCW = 21 (v = 25) Fick cardiac output/index = 3.3/1.9 PVR = 2.7 WU SVR = 1778 FA sat = 95% PA sat = 67%, 67% PAPi = 3.0  Patient Profile   Joel York is a 37 y.o. male with HTN, ADD, asthma, hx drug abuse (now on suboxone x5 yrs), and seizures. Admitted with acute systolic heart failure.   Assessment/Plan  Acute biventricular systolic heart failure >> cardiogenic shock - Echo EF <20%,  RV mildly reduced, RV mod enlarged, estimated RV systolic pressure 53.83mmHg, LA mod dilated, RA mod dilated, mild-mod MR/TR - Etiology uncertain. ? 2/2 PVCs vs genetically mediated +/- hypertension.  - LHC with normal coronaries. RHC 07/24: Low CO (Fick CO/CI 3.3/1.9) and PAPi 3.0 on milrinone 0.375 . Off NE - cMRI:  LV markedly dilated EF 10% RV 17% NICM - Limited echo 07/29: LVEF < 20%, RV function improved. - RHC today 8/5 with CVP 6  PA 53/33 (40) PCW 17 Fick 4.2/2.3 PVR 5.5 PAPi 3.3 - Increase milrinone to 0.5. Will discuss Impella RP Flex with TCTS for pre-VAD support - Plan VAD Wednesday (not transplant candidate with active smoking). D/w [facility] transplant team.   2. Chest pressure - HsTrop 39>43>37>40, d/t demand ischemia - No CAD on cath  3. PVCs - snores, will need eventual sleep study  - Suppressed with Mexiletine 200 mg BID - TSH 4.9, T4 0.79 - Keep K>4 and Mg >2  4. AKI - resolved.  5. Tobacco use - smokes 3-6 cigarettes / day - cessation discussed  6. Hx drug abuse - Continue suboxone  7..  Hypokalemia/hypomag - stable  CRITICAL CARE Performed by: [name]  Total critical care time: 45 minutes  Critical care time was exclusive of separately billable procedures and treating other patients.  Critical care was necessary to treat or prevent imminent or life-threatening deterioration.  Critical care was time spent personally by me (independent of midlevel providers or residents) on the following activities: development of treatment plan with patient and/or surrogate as well as nursing, discussions with consultants, evaluation of patient's response to treatment, examination of patient, obtaining history from patient or surrogate, ordering and performing treatments and interventions, ordering and review of laboratory studies, ordering and review of radiographic studies, pulse oximetry and re-evaluation of patient's condition.    Reuel Boom  Cyann Venti, MD  9:23 AM

## 2023-07-28 NOTE — Progress Notes (Signed)
VAD Coordinator note:  Met with pt and his wife-Kendall today. All questions have been answered. Both Joselyn Glassman and Penni Bombard are nervous about Wednesday as to be expected. Kendall informed that VAD coordinator will call her all day throughout Jhoan's surgery to keep her updated.   Pre-Intermacs completed including:  Quality of Life, KCCQ-12, and Neurocognitive trail making.   Pt completed 1480 feet during 6 minute walk.  VAD team will continue to follow.  Carlton Adam RN, BSN VAD Coordinator 24/7 Pager [phone]

## 2023-07-28 NOTE — Interval H&P Note (Signed)
History and Physical Interval Note:  07/28/2023 8:48 AM  Joel York  has presented today for surgery, with the diagnosis of heart failure - pre VAD.  The various methods of treatment have been discussed with the patient and family. After consideration of risks, benefits and other options for treatment, the patient has consented to  Procedure(s): RIGHT HEART CATH (N/A) as a surgical intervention.  The patient's history has been reviewed, patient examined, no change in status, stable for surgery.  I have reviewed the patient's chart and labs.  Questions were answered to the patient's satisfaction.     [name]

## 2023-07-28 NOTE — Plan of Care (Signed)
  Problem: Education: Goal: Knowledge of General Education information will improve Description: Including pain rating scale, medication(s)/side effects and non-pharmacologic comfort measures Outcome: Progressing   Problem: Health Behavior/Discharge Planning: Goal: Ability to manage health-related needs will improve Outcome: Progressing   Problem: Clinical Measurements: Goal: Ability to maintain clinical measurements within normal limits will improve Outcome: Progressing Goal: Will remain free from infection Outcome: Progressing Goal: Diagnostic test results will improve Outcome: Progressing Goal: Respiratory complications will improve Outcome: Progressing Goal: Cardiovascular complication will be avoided Outcome: Progressing   Problem: Activity: Goal: Risk for activity intolerance will decrease Outcome: Progressing   Problem: Nutrition: Goal: Adequate nutrition will be maintained Outcome: Progressing   Problem: Coping: Goal: Level of anxiety will decrease Outcome: Progressing   Problem: Pain Managment: Goal: General experience of comfort will improve Outcome: Progressing   Problem: Safety: Goal: Ability to remain free from injury will improve Outcome: Progressing   Problem: Education: Goal: Ability to demonstrate management of disease process will improve Outcome: Progressing Goal: Ability to verbalize understanding of medication therapies will improve Outcome: Progressing   Problem: Activity: Goal: Capacity to carry out activities will improve Outcome: Progressing   Problem: Cardiac: Goal: Ability to achieve and maintain adequate cardiopulmonary perfusion will improve Outcome: Progressing   Problem: Activity: Goal: Ability to return to baseline activity level will improve Outcome: Progressing   Problem: Cardiovascular: Goal: Ability to achieve and maintain adequate cardiovascular perfusion will improve Outcome: Progressing Goal: Vascular access site(s)  Level 0-1 will be maintained Outcome: Progressing   Problem: Health Behavior/Discharge Planning: Goal: Ability to safely manage health-related needs after discharge will improve Outcome: Progressing

## 2023-07-28 NOTE — Progress Notes (Signed)
CSW met with patient at bedside. Patient shared that he is ready to "get this over with". He shared that he told his wife that "now is not the time to be alone". Patient admits to feeling a bit nervous but ready for surgery. CSW provided supportive intervention and gave patient an adult coloring book to help pass time and reduce some of his anxiety. CSW continues to follow for supportive needs throughout implant hospitalization. Lasandra Beech, LCSW, CCSW-MCS [phone]

## 2023-07-28 NOTE — Progress Notes (Signed)
This chaplain is present with the pre-VAD Pt. for F/U spiritual care. The Pt. is sitting up in the bedside recliner at the time of the visit.  The Pt. identifies with his anxiety and the difficulty in waiting. The Pt. accepted the chaplain's invitation to talk about himself and his young family as a method of coping with the anxiety.   The chaplain listened reflectively as the Pt shared a new meaning of quality of life and his choice to slow down and figure life out one step at a time. The chaplain understands the Pt. is often solo in his decisions. The Pt. is appreciative of the team's education and the visit from the post-VAD patient. The chaplain affirmed the value in having a supportive team and individuals in this season.  The chaplain understands the Pt. is requesting a phone call to the Pt. wife. The Pt. envisions the phone call may be an opportunity for the Pt. wife to ask questions and receive answers in her unique setting. The chaplain updated the Pt. RN-[place] on the Pt. request.  The Pt. shares he is leaning on his faith as he never has before and depending on God as a source of strength.  This chaplain is available for F/U spiritual care as needed.  Chaplain Stephanie Acre [phone]

## 2023-07-28 NOTE — Plan of Care (Signed)
  Problem: Clinical Measurements: Goal: Ability to maintain clinical measurements within normal limits will improve Outcome: Progressing Goal: Will remain free from infection Outcome: Progressing   

## 2023-07-28 NOTE — Op Note (Signed)
NAMETEHRON, LAVER MEDICAL RECORD NO: [id number] ACCOUNT NO: [id number] DATE OF BIRTH: 1986-06-07 FACILITY: [facility] LOCATION: [facility] PHYSICIAN: Georgia Lopes, DDS  Operative Report   DATE OF PROCEDURE: 07/23/2023  PREOPERATIVE DIAGNOSIS:  Nonrestorable teeth 2, 15, 32.  POSTOPERATIVE DIAGNOSIS:  Nonrestorable teeth 2, 15, 32.  PROCEDURE:  Extraction teeth numbers 2, 15, 32.  INDICATIONS: The patient is a 37 year old male who was admitted for acute heart failure and is planned for AVR placement. Dental consult was obtained prior to this procedure and recommended removal of 3 nonrestorable teeth to prevent infection in the  future.  The patient was scheduled for surgery with general anesthesia.  DESCRIPTION OF PROCEDURE:  The patient was taken to the operating room and placed on the table in supine position.  General anesthesia was administered.  An oral endotracheal tube was placed and secured.  The eyes were protected and the patient was  draped for surgery.  Timeout was performed.  The posterior pharynx was suctioned and the throat pack was placed.  0.25% Marcaine with 1:200,000 epinephrine was infiltrated around the maxillary teeth buccally and palatally and in an inferior alveolar  nerve block on the right side.  A bite block was placed to the mouth.  A 15 blade was used to make an incision around tooth number 15.  The periosteum was reflected.  The tooth was elevated and removed with the dental forceps.  The socket was curetted,  irrigated and then the Gelfoam sponge was placed and the area was closed with 3-0 chromic and then the bite block was repositioned to the other side.  The endotracheal tube was moved to the other side.  The 15 blade was used to make an incision around  tooth #2. The periosteum was reflected with a periosteal elevator, the tooth was elevated with a 301 elevator and then removed from the mouth with the dental forceps.  The socket was curetted, irrigated and Gelfoam  sponge was placed and the area was  closed with 3-0 chromic.  Then, attention was turned to the right mandible, the 15 blade was used to make an incision around tooth #32.  The periosteum was reflected.  The tooth was elevated and then removed with the dental forceps.  The socket was  curetted, irrigated.  Gelfoam sponge was placed and the area was irrigated and closed with 3-0 chromic.  The oral cavity was then irrigated and suctioned.  Throat pack was removed.  The patient was left under care of anesthesia for extubation and  transport to the ICU for further cardiac monitoring.  ESTIMATED BLOOD LOSS:  Minimum.  COMPLICATIONS:  None.  SPECIMENS:  None.  COUNTS:  Correct.   SHW D: 07/23/2023 11:15:27 am T: 07/23/2023 11:39:00 am  JOB: [id number]/ [id number]

## 2023-07-29 ENCOUNTER — Encounter (HOSPITAL_COMMUNITY): Payer: Self-pay | Admitting: Internal Medicine

## 2023-07-29 ENCOUNTER — Inpatient Hospital Stay (HOSPITAL_COMMUNITY): Payer: Medicaid Other

## 2023-07-29 DIAGNOSIS — I509 Heart failure, unspecified: Secondary | ICD-10-CM | POA: Diagnosis not present

## 2023-07-29 DIAGNOSIS — I517 Cardiomegaly: Secondary | ICD-10-CM | POA: Diagnosis not present

## 2023-07-29 DIAGNOSIS — I5082 Biventricular heart failure: Secondary | ICD-10-CM | POA: Diagnosis not present

## 2023-07-29 DIAGNOSIS — I5021 Acute systolic (congestive) heart failure: Secondary | ICD-10-CM | POA: Diagnosis not present

## 2023-07-29 DIAGNOSIS — R0600 Dyspnea, unspecified: Secondary | ICD-10-CM | POA: Diagnosis not present

## 2023-07-29 DIAGNOSIS — R0989 Other specified symptoms and signs involving the circulatory and respiratory systems: Secondary | ICD-10-CM | POA: Diagnosis not present

## 2023-07-29 LAB — BASIC METABOLIC PANEL
Anion gap: 9 (ref 5–15)
BUN: 21 mg/dL — ABNORMAL HIGH (ref 6–20)
CO2: 22 mmol/L (ref 22–32)
Calcium: 8.2 mg/dL — ABNORMAL LOW (ref 8.9–10.3)
Chloride: 103 mmol/L (ref 98–111)
Creatinine, Ser: 1.22 mg/dL (ref 0.61–1.24)
GFR, Estimated: >60 mL/min (ref >60)
Glucose, Bld: 194 mg/dL — ABNORMAL HIGH (ref 70–99)
Potassium: 3.5 mmol/L (ref 3.5–5.1)
Sodium: 134 mmol/L — ABNORMAL LOW (ref 135–145)

## 2023-07-29 LAB — TYPE AND SCREEN
ABO/RH(D): AB POS
Antibody Screen: NEGATIVE

## 2023-07-29 LAB — SARS CORONAVIRUS 2 BY RT PCR: SARS Coronavirus 2 by RT PCR: NEGATIVE

## 2023-07-29 LAB — SURGICAL PCR SCREEN
MRSA, PCR: NEGATIVE
Staphylococcus aureus: POSITIVE — AB

## 2023-07-29 LAB — MRSA NEXT GEN BY PCR, NASAL: MRSA by PCR Next Gen: NOT DETECTED

## 2023-07-29 MED ORDER — POTASSIUM CHLORIDE CRYS ER 20 MEQ PO TBCR
40.0000 meq | EXTENDED_RELEASE_TABLET | Freq: Once | ORAL | Status: AC
  Administered 2023-07-29: 40 meq via ORAL
  Filled 2023-07-29: qty 2

## 2023-07-29 MED ORDER — FUROSEMIDE 10 MG/ML IJ SOLN
INTRAMUSCULAR | Status: AC
  Filled 2023-07-29: qty 4

## 2023-07-29 MED ORDER — METOLAZONE 5 MG PO TABS: 5.0000 mg | ORAL_TABLET | Freq: Once | ORAL | Status: DC

## 2023-07-29 MED ORDER — PHENYLEPHRINE HCL-NACL 20-0.9 MG/250ML-% IV SOLN
0.0000 ug/min | INTRAVENOUS | Status: DC
  Filled 2023-07-29: qty 250

## 2023-07-29 MED ORDER — VANCOMYCIN HCL 1250 MG/250ML IV SOLN
1250.0000 mg | INTRAVENOUS | Status: AC
  Administered 2023-07-30: 1250 mg via INTRAVENOUS
  Filled 2023-07-29: qty 250

## 2023-07-29 MED ORDER — HEPARIN 30,000 UNITS/1000 ML (OHS) CELLSAVER SOLUTION
Status: DC
  Filled 2023-07-29: qty 1000

## 2023-07-29 MED ORDER — DOBUTAMINE-DEXTROSE 4-5 MG/ML-% IV SOLN
2.0000 ug/kg/min | INTRAVENOUS | Status: DC
  Filled 2023-07-29: qty 250

## 2023-07-29 MED ORDER — NITROGLYCERIN IN D5W 200-5 MCG/ML-% IV SOLN
0.0000 ug/min | INTRAVENOUS | Status: DC
  Filled 2023-07-29: qty 250

## 2023-07-29 MED ORDER — POTASSIUM CHLORIDE 20 MEQ PO PACK: 40.0000 meq | PACK | Freq: Once | ORAL | Status: DC

## 2023-07-29 MED ORDER — TRANEXAMIC ACID (OHS) PUMP PRIME SOLUTION
2.0000 mg/kg | INTRAVENOUS | Status: DC
  Filled 2023-07-29: qty 1.34

## 2023-07-29 MED ORDER — CEFAZOLIN SODIUM-DEXTROSE 2-4 GM/100ML-% IV SOLN
2.0000 g | INTRAVENOUS | Status: DC
  Filled 2023-07-29: qty 100

## 2023-07-29 MED ORDER — FLUCONAZOLE IN SODIUM CHLORIDE 400-0.9 MG/200ML-% IV SOLN
400.0000 mg | INTRAVENOUS | Status: AC
  Administered 2023-07-30: 400 mg via INTRAVENOUS
  Filled 2023-07-29: qty 200

## 2023-07-29 MED ORDER — POTASSIUM CHLORIDE 2 MEQ/ML IV SOLN
80.0000 meq | INTRAVENOUS | Status: DC
  Filled 2023-07-29: qty 40

## 2023-07-29 MED ORDER — SODIUM CHLORIDE 0.9 % IV SOLN
600.0000 mg | INTRAVENOUS | Status: AC
  Administered 2023-07-30: 600 mg via INTRAVENOUS
  Filled 2023-07-29: qty 10

## 2023-07-29 MED ORDER — MILRINONE LACTATE IN DEXTROSE 20-5 MG/100ML-% IV SOLN
0.3000 ug/kg/min | INTRAVENOUS | Status: DC
  Filled 2023-07-29: qty 100

## 2023-07-29 MED ORDER — FUROSEMIDE 10 MG/ML IJ SOLN
80.0000 mg | Freq: Once | INTRAMUSCULAR | Status: AC
  Administered 2023-07-29: 80 mg via INTRAVENOUS
  Filled 2023-07-29: qty 8

## 2023-07-29 MED ORDER — INSULIN REGULAR(HUMAN) IN NACL 100-0.9 UT/100ML-% IV SOLN
INTRAVENOUS | Status: AC
  Administered 2023-07-30: 1.8 [IU]/h via INTRAVENOUS
  Filled 2023-07-29: qty 100

## 2023-07-29 MED ORDER — TEMAZEPAM 15 MG PO CAPS
15.0000 mg | ORAL_CAPSULE | Freq: Once | ORAL | Status: AC | PRN
  Administered 2023-07-29: 15 mg via ORAL
  Filled 2023-07-29: qty 1

## 2023-07-29 MED ORDER — VASOPRESSIN 20 UNITS/100 ML INFUSION FOR SHOCK
0.0400 [IU]/min | INTRAVENOUS | Status: DC
  Filled 2023-07-29: qty 100

## 2023-07-29 MED ORDER — CHLORHEXIDINE GLUCONATE 0.12 % MT SOLN
15.0000 mL | Freq: Once | OROMUCOSAL | Status: AC
  Administered 2023-07-30: 15 mL via OROMUCOSAL
  Filled 2023-07-29: qty 15

## 2023-07-29 MED ORDER — FUROSEMIDE 10 MG/ML IJ SOLN: 80.0000 mg | Freq: Once | INTRAMUSCULAR | Status: DC

## 2023-07-29 MED ORDER — DEXMEDETOMIDINE HCL IN NACL 400 MCG/100ML IV SOLN
0.1000 ug/kg/h | INTRAVENOUS | Status: AC
  Administered 2023-07-30: .3 ug/kg/h via INTRAVENOUS
  Filled 2023-07-29: qty 100

## 2023-07-29 MED ORDER — CHLORHEXIDINE GLUCONATE CLOTH 2 % EX PADS
6.0000 | MEDICATED_PAD | Freq: Once | CUTANEOUS | Status: AC
  Administered 2023-07-29 – 2023-07-30 (×2): 6 via TOPICAL

## 2023-07-29 MED ORDER — VANCOMYCIN HCL 1 G IV SOLR
1000.0000 mg | INTRAVENOUS | Status: DC
  Filled 2023-07-29: qty 20

## 2023-07-29 MED ORDER — TRANEXAMIC ACID 1000 MG/10ML IV SOLN
1.5000 mg/kg/h | INTRAVENOUS | Status: AC
  Administered 2023-07-30: 1.5 mg/kg/h via INTRAVENOUS
  Filled 2023-07-29 (×2): qty 25

## 2023-07-29 MED ORDER — TRANEXAMIC ACID (OHS) BOLUS VIA INFUSION
15.0000 mg/kg | INTRAVENOUS | Status: AC
  Administered 2023-07-30: 1005 mg via INTRAVENOUS
  Filled 2023-07-29 (×2): qty 1005

## 2023-07-29 MED ORDER — CEFAZOLIN SODIUM-DEXTROSE 2-4 GM/100ML-% IV SOLN
2.0000 g | INTRAVENOUS | Status: AC
  Administered 2023-07-30 (×2): 2 g via INTRAVENOUS
  Filled 2023-07-29: qty 100

## 2023-07-29 MED ORDER — BISACODYL 5 MG PO TBEC
5.0000 mg | DELAYED_RELEASE_TABLET | Freq: Once | ORAL | Status: AC
  Administered 2023-07-29: 5 mg via ORAL
  Filled 2023-07-29: qty 1

## 2023-07-29 MED ORDER — MUPIROCIN 2 % EX OINT
1.0000 | TOPICAL_OINTMENT | Freq: Two times a day (BID) | CUTANEOUS | Status: AC
  Administered 2023-07-29 – 2023-07-30 (×2): 1 via NASAL
  Filled 2023-07-29: qty 22

## 2023-07-29 MED ORDER — EPINEPHRINE HCL 5 MG/250ML IV SOLN IN NS
0.0000 ug/min | INTRAVENOUS | Status: AC
  Administered 2023-07-30: .5 ug/min via INTRAVENOUS
  Filled 2023-07-29: qty 250

## 2023-07-29 MED ORDER — DOPAMINE-DEXTROSE 3.2-5 MG/ML-% IV SOLN
0.0000 ug/kg/min | INTRAVENOUS | Status: DC
  Filled 2023-07-29: qty 250

## 2023-07-29 MED ORDER — NOREPINEPHRINE 4 MG/250ML-% IV SOLN
0.0000 ug/min | INTRAVENOUS | Status: AC
  Administered 2023-07-30: 2 ug/min via INTRAVENOUS
  Filled 2023-07-29: qty 250

## 2023-07-29 MED ORDER — CHLORHEXIDINE GLUCONATE CLOTH 2 % EX PADS
6.0000 | MEDICATED_PAD | Freq: Once | CUTANEOUS | Status: AC
  Administered 2023-07-30: 6 via TOPICAL

## 2023-07-29 MED ORDER — MAGNESIUM SULFATE 50 % IJ SOLN
40.0000 meq | INTRAMUSCULAR | Status: DC
  Filled 2023-07-29: qty 9.85

## 2023-07-29 NOTE — Progress Notes (Signed)
This chaplain is present for F/U spiritual care. The chaplain received an update from the Pt. RN before the visit.   The chaplain understands family will be visiting before LVAD procedure scheduled for Wednesday morning.  The Pt. shares he is ready and accepting of the hospital's schedule for the surgery. The Pt. accepted the chaplain's invitation for prayer and F/U spiritual care.  Chaplain Stephanie Acre [phone]

## 2023-07-29 NOTE — Progress Notes (Addendum)
Joel York has been discussed with the VAD Medical Review board on 07/21/23 and 07/28/23. The team feels as if the patient is a good candidate for Destination LVAD therapy due to recent smoking. The patient meets criteria for a LVAD implant as listed below:  1) NYHA Class: _IV__ documented on _8/6/24__(date)  2) Has a left ventricular ejection fraction (LVEF) < 25%   *EF__<20%_ by echo (date) _7/29/24__  3) Must meet one of the following:   Is inotrope dependent   *On inotropes_Milrinone__started__7/23/24__  OR  Has a Cardiac Index (CI) < 2.2  L/min/m2 while not on inotropes:   *CI:_     4) Must meet one of the following:   ___X___ Is on optimal medical management (OMM), based on current heart failure practice guidelines for at least 45 of the last 60 days and are failing to respond   ______ Has advanced heart failure for at least 14 days and are dependent on an intra-aortic balloon pump (IABP) or similar temporary mechanical circulatory support for at least 7 days      ____ IABP inserted (date) ____      ____ Impella inserted (date) ____  5)  Social work and palliative care evaluations demonstrate appropriate support system in place for discharge to home with a VAD and that end of life discussions have taken place. Both services have expressed no concern regarding patient's candidacy.         *Social work consult (date): 07/18/23        *Palliative Care Consult (date): 07/18/23  6)  Primary caretaker identified that can be taught along with the patient how to manage        the VAD equipment.        *Name: Joel York (wife)  7)  Deemed appropriate by our financial coordinator: Joel York        Prior approval: PA not needed per insurance online portal  8)  VAD Coordinator, Carlton Adam RN has met with patient and caregiver, shown them the VAD equipment and discussed with the patient and caregiver about lifestyle changes necessary for success on mechanical circulatory  device.        *Met with [name] on 07/17/23.       *Consent for VAD Evaluation/Caregiver Agreement/HIPPA Release/Photo Release signed on 07/18/23.   9)  Six Minute Walk: 1480 ft   10) KCCQ Pre VAD:  [facility] Cardiomyopathy Questionnaire  KCCQ-12    1 a. Ability to shower/bathe Extremely limited   1 b. Ability to walk 1 block Extremely limited   1 c. Ability to hurry/jog Extremely limited   2. Edema feet/ankles/legs Every morning   3. Limited by fatigue All the time   4. Limited by dyspnea All the time   5. Sitting up / on 3+ pillows Every night   6. Limited enjoyment of life Extremely limited   7. Rest of life w/ symptoms Mostly dissatisfied   8 a. Participation in hobbies Severely limited   8 b. Participation in chores Severely limited   8 c. Visiting family/friends Severely limited      11)  Intermacs profile: 2  INTERMACS 1: Critical cardiogenic shock describes a patient who is "crashing and burning", in which a patient has life-threatening hypotension and rapidly escalating inotropic pressor support, with critical organ hypoperfusion often confirmed by worsening acidosis and lactate levels.  INTERMACS 2: Progressive decline describes a patient who has been demonstrated "dependent" on inotropic support but nonetheless shows signs of  continuing deterioration in nutrition, renal function, fluid retention, or other major status indicator. Patient profile 2 can also describe a patient with refractory volume overload, perhaps with evidence of impaired perfusion, in whom inotropic infusions cannot be maintained due to tachyarrhythmias, clinical ischemia, or other intolerance.  INTERMACS 3: Stable but inotrope dependent describes a patient who is clinically stable on mild-moderate doses of intravenous inotropes (or has a temporary circulatory support device) after repeated documentation of failure to wean without symptomatic hypotension, worsening symptoms, or progressive  organ dysfunction (usually renal). It is critical to monitor nutrition, renal function, fluid balance, and overall status carefully in order to distinguish between a   patient who is truly stable at Patient Profile 3 and a patient who has unappreciated decline rendering them Patient Profile 2. This patient may be either at home or in the hospital.      INTERMACS 4: Resting symptoms describes a patient who is at home on oral therapy but frequently has symptoms of congestion at rest or with activities of daily living (ADL). He or she may have orthopnea, shortness of breath during ADL such as dressing or bathing, gastrointestinal symptoms (abdominal discomfort, nausea, poor appetite), disabling ascites or severe lower extremity edema. This patient should be carefully considered for more intensive management and surveillance programs, which may in some cases, reveal poor compliance that would compromise outcomes with any therapy.   .   INTERMACS 5: Exertion Intolerant describes a patient who is comfortable at rest but unable to engage in any activity, living predominantly within the house or housebound. This patient has no congestive symptoms, but may have chronically elevated volume status, frequently with renal dysfunction, and may be characterized as exercise intolerant.      INTERMACS 6: Exertion Limited also describes a patient who is comfortable at rest without evidence of fluid overload, but who is able to do some mild activity. Activities of daily living are comfortable and minor activities outside the home such as visiting friends or going to a restaurant can be performed, but fatigue results within a few minutes of any meaningful physical exertion. This patient has occasional episodes of worsening symptoms and is likely to have had a hospitalization for heart failure within the past year.   INTERMACS 7: Advanced NYHA Class 3 describes a patient who is clinically stable with a reasonable level of  comfortable activity, despite history of previous decompensation that is not recent. This patient is usually able to walk more than a block. Any decompensation requiring intravenous diuretics or hospitalization within the previous month should make this person a Patient Profile 6 or lower.     Alyce Pagan RN VAD Coordinator  Office: [phone]  24/7 Pager: [phone]

## 2023-07-29 NOTE — Progress Notes (Signed)
1 Day Post-Op Procedure(s) (LRB): RIGHT HEART CATH (N/A) Subjective: Patient examined, results of most recent right heart cath data reviewed and discussed with patient, images of the last 2D echocardiogram also personally reviewed and discussed with patient. The patient has been carefully evaluated and prepared for HeartMate 3 implantation by the advanced heart failure team.  He has recovered from dental extractions performed 4 days ago.  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  He has been recommended for mechanical cardiac support including HeartMate 3 implantation and possible temporary right ventricular mechanical support.  I have discussed the details of the operation including the use of general anesthesia and cardiopulmonary bypass, the location of the surgical incisions, and the expected postoperative hospital recovery.  I have discussed the expected benefits of the procedure as well as the risks with the patient.  He understands that his best chance for long-term survival is with the implantable VAD and that he is at increased risk due to the preoperative RV dysfunction.  Objective: Vital signs in last 24 hours: Temp:  [97.8 F (36.6 C)-98.2 F (36.8 C)] 97.8 F (36.6 C) (08/06 0807) Pulse Rate:  [59-197] 109 (08/06 1810) Cardiac Rhythm: Sinus tachycardia (08/06 1200) BP: (98-119)/(74-94) 98/74 (08/06 1810) SpO2:  [92 %-100 %] 95 % (08/06 1810) Weight:  [67 kg] 67 kg (08/06 0500)  Hemodynamic parameters for last 24 hours: CVP:  [0 mmHg-26 mmHg] 3 mmHg  Intake/Output from previous day: 08/05 0701 - 08/06 0700 In: 571.9 [P.O.:360; I.V.:211.9] Out: 1435 [Urine:1435] Intake/Output this shift: No intake/output data recorded.      Physical Exam  General:  HEENT: Normocephalic pupils equal , dentition adequate, extraction sites appear to be healing Neck: Supple without JVD, adenopathy, or bruit Chest: Clear  to auscultation, symmetrical breath sounds, no rhonchi, no tenderness             or deformity Cardiovascular: Regular rate and rhythm, no murmur, no gallop, peripheral pulses             palpable in all extremities Abdomen:  Soft, nontender, no palpable mass or organomegaly Extremities: Warm, well-perfused, no clubbing cyanosis edema or tenderness,              no venous stasis changes of the legs Rectal/GU: Deferred Neuro: Grossly non--focal and symmetrical throughout Skin: Clean and dry without rash or ulceration   Lab Results: Recent Labs    07/28/23 0320 07/28/23 0902 07/29/23 0325  WBC 6.1  --  5.9  HGB 14.4 16.0  16.0 13.9  HCT 44.1 47.0  47.0 42.5  PLT 199  --  207   BMET:  Recent Labs    07/28/23 2316 07/29/23 0325  NA 137 134*  K 4.1 3.5  CL 104 103  CO2 22 22  GLUCOSE 188* 194*  BUN 22* 21*  CREATININE 1.39* 1.22  CALCIUM 8.6* 8.2*    PT/INR: No results for input(s): "LABPROT", "INR" in the last 72 hours. ABG    Component Value Date/Time   PHART 7.456 (H) 07/16/2023 1440   HCO3 26.3 07/28/2023 0902   HCO3 25.9 07/28/2023 0902   TCO2 28 07/28/2023 0902   TCO2 27 07/28/2023 0902   O2SAT 63.8 07/29/2023 0325   CBG (last 3)  No results for input(s): "GLUCAP" in the last 72 hours.  Assessment/Plan: S/P Procedure(s) (LRB): RIGHT HEART CATH (N/A) Plan implantation of HeartMate 3 left ventricular assist device tomorrow with possible temporary placement of  right ventricular assist device if needed.   LOS: 15 days    Lovett Sox 07/29/2023

## 2023-07-29 NOTE — Plan of Care (Signed)
  Problem: Education: Goal: Knowledge of General Education information will improve Description: Including pain rating scale, medication(s)/side effects and non-pharmacologic comfort measures Outcome: Progressing   Problem: Health Behavior/Discharge Planning: Goal: Ability to manage health-related needs will improve Outcome: Progressing   Problem: Clinical Measurements: Goal: Ability to maintain clinical measurements within normal limits will improve Outcome: Progressing Goal: Will remain free from infection Outcome: Progressing Goal: Diagnostic test results will improve Outcome: Progressing Goal: Respiratory complications will improve Outcome: Progressing Goal: Cardiovascular complication will be avoided Outcome: Progressing   Problem: Activity: Goal: Risk for activity intolerance will decrease Outcome: Progressing   Problem: Nutrition: Goal: Adequate nutrition will be maintained Outcome: Progressing   Problem: Coping: Goal: Level of anxiety will decrease Outcome: Progressing   Problem: Pain Managment: Goal: General experience of comfort will improve Outcome: Progressing   Problem: Safety: Goal: Ability to remain free from injury will improve Outcome: Progressing   Problem: Skin Integrity: Goal: Risk for impaired skin integrity will decrease Outcome: Progressing   Problem: Education: Goal: Ability to demonstrate management of disease process will improve Outcome: Progressing Goal: Ability to verbalize understanding of medication therapies will improve Outcome: Progressing   Problem: Activity: Goal: Capacity to carry out activities will improve Outcome: Progressing   Problem: Cardiac: Goal: Ability to achieve and maintain adequate cardiopulmonary perfusion will improve Outcome: Progressing   Problem: Activity: Goal: Ability to return to baseline activity level will improve Outcome: Progressing   Problem: Cardiovascular: Goal: Ability to achieve and  maintain adequate cardiovascular perfusion will improve Outcome: Progressing Goal: Vascular access site(s) Level 0-1 will be maintained Outcome: Progressing

## 2023-07-29 NOTE — Progress Notes (Addendum)
Advanced Heart Failure Rounding Note  PCP-Cardiologist: None   Subjective:    07/23: Initial CO-OX 30%. Started on milrinone 0.25 + NE.  7/25: Milrinone increased to 0.375  RHC 8/5 on milrinone 0.375 after cath milrinone increased to 0.5 mcg.  RA = 6 RV = 54/11 PA = 53/33 (40) PCW = 17 Fick cardiac output/index = 4.2/2.3 PVR = 5.5 PAPi = 3.3 Ao sat = 91% PA sat = 62%, 62% Assessment: 1. Moderately reduced CO despite milrinone at 0.375 2. Moderate predominantly pre-capillary PAH with elevated PVR 3. PAPi 3.3  CO-OX 64% on milrinone 0.5 mcg.   Earlier this morning developed cough and shortness of breath. CVP up to 10-11.  Objective:   Weight Range: 67 kg Body mass index is 23.13 kg/m.   Vital Signs:   Temp:  [97.9 F (36.6 C)-98.4 F (36.9 C)] 98 F (36.7 C) (08/06 0400) Pulse Rate:  [95-241] 197 (08/06 0400) Resp:  [8-18] 10 (08/05 1000) BP: (97-134)/(71-92) 99/71 (08/05 1745) SpO2:  [87 %-100 %] 93 % (08/06 0400) Weight:  [67 kg] 67 kg (08/06 0500) Last BM Date : 07/28/23  Weight change: Filed Weights   07/27/23 0500 07/28/23 0500 07/29/23 0500  Weight: 68.7 kg 66.8 kg 67 kg   Intake/Output:   Intake/Output Summary (Last 24 hours) at 07/29/2023 0653 Last data filed at 07/29/2023 0405 Gross per 24 hour  Intake 559.03 ml  Output 1635 ml  Net -1075.97 ml    CVP 10-11  Physical Exam   General:  No resp difficulty HEENT: normal Neck: supple. JVP 10-11 . Carotids 2+ bilat; no bruits. No lymphadenopathy or thryomegaly appreciated. Cor: PMI nondisplaced. Tachy Regular rate & rhythm. No rubs, or murmurs. +S3 Lungs: RLL LLL EW on 2 liters Abdomen: soft, nontender, nondistended. No hepatosplenomegaly. No bruits or masses. Good bowel sounds. Extremities: no cyanosis, clubbing, rash, edema. RUE PICC  Neuro: alert & orientedx3, cranial nerves grossly intact. moves all 4 extremities w/o difficulty. Affect pleasant   Telemetry  ST 110-120s with brief  NSVT  Labs    CBC Recent Labs    07/28/23 0320 07/28/23 0902 07/29/23 0325  WBC 6.1  --  5.9  NEUTROABS 2.6  --  2.7  HGB 14.4 16.0  16.0 13.9  HCT 44.1 47.0  47.0 42.5  MCV 89.1  --  88.9  PLT 199  --  207   Basic Metabolic Panel Recent Labs    65/78/46 0320 07/28/23 0902 07/28/23 2316 07/29/23 0325  NA 136   < > 137 134*  K 3.6   < > 4.1 3.5  CL 102  --  104 103  CO2 26  --  22 22  GLUCOSE 127*  --  188* 194*  BUN 18  --  22* 21*  CREATININE 1.25*  --  1.39* 1.22  CALCIUM 8.2*  --  8.6* 8.2*  MG 1.9  --   --  2.1   < > = values in this interval not displayed.    BNP (last 3 results) Recent Labs    07/14/23 0101  BNP 1,810.8*  2,494.6*    Imaging   CARDIAC CATHETERIZATION  Result Date: 07/28/2023 Findings: On milrinone 0.375 RA = 6 RV = 54/11 PA = 53/33 (40) PCW = 17 Fick cardiac output/index = 4.2/2.3 PVR = 5.5 PAPi = 3.3 Ao sat = 91% PA sat = 62%, 62% Assessment: 1. Moderately reduced CO despite milrinone at 0.375 2. Moderate predominantly pre-capillary PAH with elevated  PVR 3. PAPi 3.3 Plan/Discussion: Will increase milrinone to 0.5. Will discuss R-sided Impella support pre-VAD with surgical team. [name], MD 9:17 AM  Medications:   Scheduled Medications:  buprenorphine-naloxone  1 tablet Sublingual Daily   Chlorhexidine Gluconate Cloth  6 each Topical Daily   digoxin  0.125 mg Oral Daily   enoxaparin (LOVENOX) injection  40 mg Subcutaneous Q24H   loratadine  10 mg Oral QPM   melatonin  3 mg Oral QHS   mexiletine  200 mg Oral BID   mometasone-formoterol  2 puff Inhalation BID   montelukast  10 mg Oral q morning   polyethylene glycol  17 g Oral Daily   sodium chloride flush  10-40 mL Intracatheter Q12H   sodium chloride flush  3 mL Intravenous Q12H   sodium chloride flush  3 mL Intravenous Q12H   sodium chloride flush  3 mL Intravenous Q12H    Infusions:  sodium chloride     sodium chloride Stopped (07/28/23 0650)   sodium chloride      milrinone 0.5 mcg/kg/min (07/29/23 0400)    PRN Medications: sodium chloride, sodium chloride, sodium chloride, acetaminophen, acetaminophen, acetaminophen, alum & mag hydroxide-simeth, fentaNYL (SUBLIMAZE) injection, fluticasone, HYDROcodone-acetaminophen, levalbuterol, LORazepam, nitroGLYCERIN, ondansetron (ZOFRAN) IV, mouth rinse, sodium chloride flush, sodium chloride flush, sodium chloride flush, sodium chloride flush   R/LHC 07/24: On milrinone 0.25 and NE 2    Ao = 97/70 (80) LV = 112/31 RA = 7 RV = 43/12 PA = 42/21 (30) PCW = 21 (v = 25) Fick cardiac output/index = 3.3/1.9 PVR = 2.7 WU SVR = 1778 FA sat = 95% PA sat = 67%, 67% PAPi = 3.0  Patient Profile   Joel York is a 37 y.o. male with HTN, ADD, asthma, hx drug abuse (now on suboxone x5 yrs), and seizures. Admitted with acute systolic heart failure. NYHA IV -> shock.   Assessment/Plan  Acute biventricular systolic heart failure >> cardiogenic shock - Admitted NYHA IV symptoms .Echo EF <20%,  RV mildly reduced, RV mod enlarged, estimated RV systolic pressure 53.79mmHg, LA mod dilated, RA mod dilated, mild-mod MR/TR - Etiology uncertain. ? 2/2 PVCs vs genetically mediated +/- hypertension.  - LHC with normal coronaries. RHC 07/24: Low CO (Fick CO/CI 3.3/1.9) and PAPi 3.0 on milrinone 0.375 . Off NE - cMRI:  LV markedly dilated EF 10% RV 17% NICM - Limited echo 07/29: LVEF < 20%, RV function improved. - RHC  8/5 CVP 6  PA 53/33 (40) PCW 17 Fick 4.2/2.3 PVR 5.5 PAPi 3.3 - CO-OX 64% on milrinone to 0.5.  - CVP trending up 10-11. Give 80 mg IV lasix x1. Supp K. CXR now.  - May need Impella RP Flex with TCTS for pre-VAD support - Plan VAD tomorrow (not transplant candidate with active smoking). D/w [facility] transplant team.   2. Chest pressure - HsTrop 39>43>37>40, d/t demand ischemia - No CAD on cath  3. PVCs - snores, will need eventual sleep study  - Suppressed with Mexiletine 200 mg BID - TSH 4.9, T4 0.79 -  Keep K>4 and Mg >2 - Supp K   4. AKI - resolved.  5. Tobacco use - smokes 3-6 cigarettes / day - cessation discussed  6. Hx drug abuse - Continue suboxone  7.. Hypokalemia/hypomag - stable  CVP trending up. On exam EW. CXR now. Diurese with IV lasix. Continue inhalers.   Tonye Becket, NP  6:53 AM  Agree with above.   On milrinone 0.5. Co-ox 65%  More SOB this am. CVP up + wheezing  General:  Sitting up in bed + SOB HEENT: normal Neck: supple. JVP up Carotids 2+ bilat; no bruits. No lymphadenopathy or thryomegaly appreciated. Cor: Regular tachy + s3  Lungs:+ wheezing  Abdomen: soft, nontender, nondistended. No hepatosplenomegaly. No bruits or masses. Good bowel sounds. Extremities: no cyanosis, clubbing, rash, edema Neuro: alert & orientedx3, cranial nerves grossly intact. moves all 4 extremities w/o difficulty. Affect pleasant  Remains tenuous with low output He is volume overloaded. Agree with IV lasix. Continue milrinone. Low threshold to add back NE.   D/w TCTS. Plan VAD tomorrow. Needs further optimization today. No plan for RP Impella today.    CRITICAL CARE Performed by: [name]  Total critical care time: 40 minutes  Critical care time was exclusive of separately billable procedures and treating other patients.  Critical care was necessary to treat or prevent imminent or life-threatening deterioration.  Critical care was time spent personally by me (independent of midlevel providers or residents) on the following activities: development of treatment plan with patient and/or surrogate as well as nursing, discussions with consultants, evaluation of patient's response to treatment, examination of patient, obtaining history from patient or surrogate, ordering and performing treatments and interventions, ordering and review of laboratory studies, ordering and review of radiographic studies, pulse oximetry and re-evaluation of patient's condition.   [name], MD  9:29 AM

## 2023-07-29 NOTE — Progress Notes (Signed)
Patient awakened from sleep in a coughing fit, endorsing the feeling of drowning. CVP is currently 10, there are fine crackles in bilateral lung bases.  The patient's oxygen saturation did not drop. Patient was sat up and placed on 2L [place] with reports of relief. PA Rolly Salter was paged. Awaiting call back and further orders, will continue to monitor.

## 2023-07-30 ENCOUNTER — Encounter (HOSPITAL_COMMUNITY): Payer: Self-pay | Admitting: Anesthesiology

## 2023-07-30 ENCOUNTER — Inpatient Hospital Stay (HOSPITAL_COMMUNITY): Payer: Medicaid Other | Admitting: Anesthesiology

## 2023-07-30 ENCOUNTER — Inpatient Hospital Stay (HOSPITAL_COMMUNITY): Payer: Medicaid Other

## 2023-07-30 ENCOUNTER — Other Ambulatory Visit: Payer: Self-pay

## 2023-07-30 ENCOUNTER — Inpatient Hospital Stay (HOSPITAL_COMMUNITY)
Admission: EM | Disposition: A | Discharge status: Home / Self Care | Payer: Self-pay | Source: Home / Self Care | Attending: Internal Medicine

## 2023-07-30 DIAGNOSIS — I428 Other cardiomyopathies: Secondary | ICD-10-CM | POA: Diagnosis not present

## 2023-07-30 DIAGNOSIS — I517 Cardiomegaly: Secondary | ICD-10-CM | POA: Diagnosis not present

## 2023-07-30 DIAGNOSIS — J4530 Mild persistent asthma, uncomplicated: Secondary | ICD-10-CM | POA: Diagnosis not present

## 2023-07-30 DIAGNOSIS — I5082 Biventricular heart failure: Secondary | ICD-10-CM | POA: Diagnosis not present

## 2023-07-30 DIAGNOSIS — J45909 Unspecified asthma, uncomplicated: Secondary | ICD-10-CM | POA: Diagnosis not present

## 2023-07-30 DIAGNOSIS — I429 Cardiomyopathy, unspecified: Secondary | ICD-10-CM | POA: Diagnosis not present

## 2023-07-30 DIAGNOSIS — J918 Pleural effusion in other conditions classified elsewhere: Secondary | ICD-10-CM | POA: Diagnosis not present

## 2023-07-30 DIAGNOSIS — F419 Anxiety disorder, unspecified: Secondary | ICD-10-CM | POA: Diagnosis not present

## 2023-07-30 DIAGNOSIS — R570 Cardiogenic shock: Secondary | ICD-10-CM | POA: Diagnosis not present

## 2023-07-30 DIAGNOSIS — J811 Chronic pulmonary edema: Secondary | ICD-10-CM | POA: Diagnosis not present

## 2023-07-30 DIAGNOSIS — Z4682 Encounter for fitting and adjustment of non-vascular catheter: Secondary | ICD-10-CM | POA: Diagnosis not present

## 2023-07-30 DIAGNOSIS — Z452 Encounter for adjustment and management of vascular access device: Secondary | ICD-10-CM | POA: Diagnosis not present

## 2023-07-30 DIAGNOSIS — I514 Myocarditis, unspecified: Secondary | ICD-10-CM | POA: Diagnosis not present

## 2023-07-30 DIAGNOSIS — I272 Pulmonary hypertension, unspecified: Secondary | ICD-10-CM | POA: Diagnosis not present

## 2023-07-30 DIAGNOSIS — Z515 Encounter for palliative care: Secondary | ICD-10-CM | POA: Diagnosis not present

## 2023-07-30 DIAGNOSIS — R569 Unspecified convulsions: Secondary | ICD-10-CM | POA: Diagnosis not present

## 2023-07-30 DIAGNOSIS — Z95811 Presence of heart assist device: Secondary | ICD-10-CM | POA: Diagnosis not present

## 2023-07-30 DIAGNOSIS — E872 Acidosis, unspecified: Secondary | ICD-10-CM | POA: Diagnosis not present

## 2023-07-30 DIAGNOSIS — I5021 Acute systolic (congestive) heart failure: Secondary | ICD-10-CM | POA: Diagnosis not present

## 2023-07-30 DIAGNOSIS — I5023 Acute on chronic systolic (congestive) heart failure: Secondary | ICD-10-CM | POA: Diagnosis not present

## 2023-07-30 DIAGNOSIS — Z1152 Encounter for screening for COVID-19: Secondary | ICD-10-CM | POA: Diagnosis not present

## 2023-07-30 DIAGNOSIS — I110 Hypertensive heart disease with heart failure: Secondary | ICD-10-CM | POA: Diagnosis not present

## 2023-07-30 DIAGNOSIS — I2489 Other forms of acute ischemic heart disease: Secondary | ICD-10-CM | POA: Diagnosis not present

## 2023-07-30 HISTORY — PX: INSERTION OF IMPLANTABLE LEFT VENTRICULAR ASSIST DEVICE: SHX5866

## 2023-07-30 HISTORY — PX: TEE WITHOUT CARDIOVERSION: SHX5443

## 2023-07-30 LAB — POCT I-STAT 7, (LYTES, BLD GAS, ICA,H+H)
Acid-Base Excess: 1 mmol/L (ref 0.0–2.0)
Acid-Base Excess: 0 mmol/L (ref 0.0–2.0)
Acid-Base Excess: 1 mmol/L (ref 0.0–2.0)
Acid-Base Excess: 2 mmol/L (ref 0.0–2.0)
Acid-base deficit: 5 mmol/L — ABNORMAL HIGH (ref 0.0–2.0)
Acid-base deficit: 1 mmol/L (ref 0.0–2.0)
Acid-base deficit: 2 mmol/L (ref 0.0–2.0)
Bicarbonate: 24.5 mmol/L (ref 20.0–28.0)
Bicarbonate: 25.5 mmol/L (ref 20.0–28.0)
Bicarbonate: 20 mmol/L (ref 20.0–28.0)
Bicarbonate: 26 mmol/L (ref 20.0–28.0)
Bicarbonate: 26.8 mmol/L (ref 20.0–28.0)
Bicarbonate: 24.6 mmol/L (ref 20.0–28.0)
Bicarbonate: 24.2 mmol/L (ref 20.0–28.0)
Calcium, Ion: 1.15 mmol/L (ref 1.15–1.40)
Calcium, Ion: 1.15 mmol/L (ref 1.15–1.40)
Calcium, Ion: 0.92 mmol/L — ABNORMAL LOW (ref 1.15–1.40)
Calcium, Ion: 0.9 mmol/L — ABNORMAL LOW (ref 1.15–1.40)
Calcium, Ion: 0.95 mmol/L — ABNORMAL LOW (ref 1.15–1.40)
Calcium, Ion: 0.97 mmol/L — ABNORMAL LOW (ref 1.15–1.40)
Calcium, Ion: 1.05 mmol/L — ABNORMAL LOW (ref 1.15–1.40)
HCT: 50 % (ref 39.0–52.0)
HCT: 47 % (ref 39.0–52.0)
HCT: 37 % — ABNORMAL LOW (ref 39.0–52.0)
HCT: 36 % — ABNORMAL LOW (ref 39.0–52.0)
HCT: 33 % — ABNORMAL LOW (ref 39.0–52.0)
HCT: 36 % — ABNORMAL LOW (ref 39.0–52.0)
HCT: 35 % — ABNORMAL LOW (ref 39.0–52.0)
Hemoglobin: 17 g/dL (ref 13.0–17.0)
Hemoglobin: 16 g/dL (ref 13.0–17.0)
Hemoglobin: 12.6 g/dL — ABNORMAL LOW (ref 13.0–17.0)
Hemoglobin: 12.2 g/dL — ABNORMAL LOW (ref 13.0–17.0)
Hemoglobin: 11.2 g/dL — ABNORMAL LOW (ref 13.0–17.0)
Hemoglobin: 12.2 g/dL — ABNORMAL LOW (ref 13.0–17.0)
Hemoglobin: 11.9 g/dL — ABNORMAL LOW (ref 13.0–17.0)
O2 Saturation: 97 %
O2 Saturation: 100 %
O2 Saturation: 100 %
O2 Saturation: 100 %
O2 Saturation: 100 %
O2 Saturation: 100 %
O2 Saturation: 97 %
Patient temperature: 36.7
Patient temperature: 36.5
Potassium: 3.7 mmol/L (ref 3.5–5.1)
Potassium: 4 mmol/L (ref 3.5–5.1)
Potassium: 4 mmol/L (ref 3.5–5.1)
Potassium: 3.8 mmol/L (ref 3.5–5.1)
Potassium: 3.6 mmol/L (ref 3.5–5.1)
Potassium: 3.4 mmol/L — ABNORMAL LOW (ref 3.5–5.1)
Potassium: 4.3 mmol/L (ref 3.5–5.1)
Sodium: 139 mmol/L (ref 135–145)
Sodium: 137 mmol/L (ref 135–145)
Sodium: 138 mmol/L (ref 135–145)
Sodium: 140 mmol/L (ref 135–145)
Sodium: 140 mmol/L (ref 135–145)
Sodium: 140 mmol/L (ref 135–145)
Sodium: 140 mmol/L (ref 135–145)
TCO2: 26 mmol/L (ref 22–32)
TCO2: 27 mmol/L (ref 22–32)
TCO2: 21 mmol/L — ABNORMAL LOW (ref 22–32)
TCO2: 27 mmol/L (ref 22–32)
TCO2: 28 mmol/L (ref 22–32)
TCO2: 26 mmol/L (ref 22–32)
TCO2: 26 mmol/L (ref 22–32)
pCO2 arterial: 35.1 mmHg (ref 32–48)
pCO2 arterial: 42.8 mmHg (ref 32–48)
pCO2 arterial: 37.3 mmHg (ref 32–48)
pCO2 arterial: 43.5 mmHg (ref 32–48)
pCO2 arterial: 43.7 mmHg (ref 32–48)
pCO2 arterial: 43.7 mmHg (ref 32–48)
pCO2 arterial: 46.7 mmHg (ref 32–48)
pH, Arterial: 7.45 (ref 7.35–7.45)
pH, Arterial: 7.384 (ref 7.35–7.45)
pH, Arterial: 7.337 — ABNORMAL LOW (ref 7.35–7.45)
pH, Arterial: 7.384 (ref 7.35–7.45)
pH, Arterial: 7.395 (ref 7.35–7.45)
pH, Arterial: 7.359 (ref 7.35–7.45)
pH, Arterial: 7.321 — ABNORMAL LOW (ref 7.35–7.45)
pO2, Arterial: 80 mmHg — ABNORMAL LOW (ref 83–108)
pO2, Arterial: 471 mmHg — ABNORMAL HIGH (ref 83–108)
pO2, Arterial: 413 mmHg — ABNORMAL HIGH (ref 83–108)
pO2, Arterial: 337 mmHg — ABNORMAL HIGH (ref 83–108)
pO2, Arterial: 422 mmHg — ABNORMAL HIGH (ref 83–108)
pO2, Arterial: 211 mmHg — ABNORMAL HIGH (ref 83–108)
pO2, Arterial: 96 mmHg (ref 83–108)

## 2023-07-30 LAB — POCT I-STAT EG7
Acid-Base Excess: 2 mmol/L (ref 0.0–2.0)
Bicarbonate: 29 mmol/L — ABNORMAL HIGH (ref 20.0–28.0)
Calcium, Ion: 0.97 mmol/L — ABNORMAL LOW (ref 1.15–1.40)
HCT: 38 % — ABNORMAL LOW (ref 39.0–52.0)
Hemoglobin: 12.9 g/dL — ABNORMAL LOW (ref 13.0–17.0)
O2 Saturation: 87 %
Potassium: 3.4 mmol/L — ABNORMAL LOW (ref 3.5–5.1)
Sodium: 142 mmol/L (ref 135–145)
TCO2: 31 mmol/L (ref 22–32)
pCO2, Ven: 53.8 mmHg (ref 44–60)
pH, Ven: 7.339 (ref 7.25–7.43)
pO2, Ven: 57 mmHg — ABNORMAL HIGH (ref 32–45)

## 2023-07-30 LAB — PREPARE FRESH FROZEN PLASMA
Unit division: 0
Unit division: 0

## 2023-07-30 LAB — DIC (DISSEMINATED INTRAVASCULAR COAGULATION)PANEL
D-Dimer, Quant: 1.44 ug{FEU}/mL — ABNORMAL HIGH (ref 0.00–0.50)
Fibrinogen: 329 mg/dL (ref 210–475)
INR: 1.3 — ABNORMAL HIGH (ref 0.8–1.2)
Platelets: 176 K/uL (ref 150–400)
Prothrombin Time: 16.1 s — ABNORMAL HIGH (ref 11.4–15.2)
Smear Review: NONE SEEN
aPTT: 32 s (ref 24–36)

## 2023-07-30 LAB — COOXEMETRY PANEL
Carboxyhemoglobin: 1.5 % (ref 0.5–1.5)
Carboxyhemoglobin: 1.9 % — ABNORMAL HIGH (ref 0.5–1.5)
Carboxyhemoglobin: 1.3 % (ref 0.5–1.5)
Methemoglobin: 0.9 % (ref 0.0–1.5)
Methemoglobin: 0.7 % (ref 0.0–1.5)
Methemoglobin: <0.7 % (ref 0.0–1.5)
O2 Saturation: 73.6 %
O2 Saturation: 69.8 %
O2 Saturation: 75.1 %
Total hemoglobin: 12.2 g/dL (ref 12.0–16.0)
Total hemoglobin: 12.5 g/dL (ref 12.0–16.0)
Total hemoglobin: 12.4 g/dL (ref 12.0–16.0)

## 2023-07-30 LAB — CBC
HCT: 37.2 % — ABNORMAL LOW (ref 39.0–52.0)
HCT: 36 % — ABNORMAL LOW (ref 39.0–52.0)
Hemoglobin: 12.2 g/dL — ABNORMAL LOW (ref 13.0–17.0)
Hemoglobin: 11.8 g/dL — ABNORMAL LOW (ref 13.0–17.0)
MCH: 29.1 pg (ref 26.0–34.0)
MCH: 29.2 pg (ref 26.0–34.0)
MCHC: 32.8 g/dL (ref 30.0–36.0)
MCHC: 32.8 g/dL (ref 30.0–36.0)
MCV: 88.8 fL (ref 80.0–100.0)
MCV: 89.1 fL (ref 80.0–100.0)
Platelets: 176 K/uL (ref 150–400)
Platelets: 168 10*3/uL (ref 150–400)
RBC: 4.19 MIL/uL — ABNORMAL LOW (ref 4.22–5.81)
RBC: 4.04 MIL/uL — ABNORMAL LOW (ref 4.22–5.81)
RDW: 13.7 % (ref 11.5–15.5)
RDW: 13.6 % (ref 11.5–15.5)
WBC: 12 K/uL — ABNORMAL HIGH (ref 4.0–10.5)
WBC: 9 10*3/uL (ref 4.0–10.5)
nRBC: 0 % (ref 0.0–0.2)
nRBC: 0 % (ref 0.0–0.2)

## 2023-07-30 LAB — POCT I-STAT, CHEM 8
BUN: 27 mg/dL — ABNORMAL HIGH (ref 6–20)
BUN: 28 mg/dL — ABNORMAL HIGH (ref 6–20)
BUN: 25 mg/dL — ABNORMAL HIGH (ref 6–20)
BUN: 25 mg/dL — ABNORMAL HIGH (ref 6–20)
BUN: 25 mg/dL — ABNORMAL HIGH (ref 6–20)
Calcium, Ion: 1.13 mmol/L — ABNORMAL LOW (ref 1.15–1.40)
Calcium, Ion: 1.16 mmol/L (ref 1.15–1.40)
Calcium, Ion: 0.98 mmol/L — ABNORMAL LOW (ref 1.15–1.40)
Calcium, Ion: 0.93 mmol/L — ABNORMAL LOW (ref 1.15–1.40)
Calcium, Ion: 0.97 mmol/L — ABNORMAL LOW (ref 1.15–1.40)
Chloride: 104 mmol/L (ref 98–111)
Chloride: 103 mmol/L (ref 98–111)
Chloride: 101 mmol/L (ref 98–111)
Chloride: 100 mmol/L (ref 98–111)
Chloride: 102 mmol/L (ref 98–111)
Creatinine, Ser: 1.2 mg/dL (ref 0.61–1.24)
Creatinine, Ser: 1.2 mg/dL (ref 0.61–1.24)
Creatinine, Ser: 1.1 mg/dL (ref 0.61–1.24)
Creatinine, Ser: 1.2 mg/dL (ref 0.61–1.24)
Creatinine, Ser: 1.3 mg/dL — ABNORMAL HIGH (ref 0.61–1.24)
Glucose, Bld: 158 mg/dL — ABNORMAL HIGH (ref 70–99)
Glucose, Bld: 172 mg/dL — ABNORMAL HIGH (ref 70–99)
Glucose, Bld: 149 mg/dL — ABNORMAL HIGH (ref 70–99)
Glucose, Bld: 144 mg/dL — ABNORMAL HIGH (ref 70–99)
Glucose, Bld: 156 mg/dL — ABNORMAL HIGH (ref 70–99)
HCT: 48 % (ref 39.0–52.0)
HCT: 49 % (ref 39.0–52.0)
HCT: 40 % (ref 39.0–52.0)
HCT: 33 % — ABNORMAL LOW (ref 39.0–52.0)
HCT: 35 % — ABNORMAL LOW (ref 39.0–52.0)
Hemoglobin: 16.3 g/dL (ref 13.0–17.0)
Hemoglobin: 16.7 g/dL (ref 13.0–17.0)
Hemoglobin: 13.6 g/dL (ref 13.0–17.0)
Hemoglobin: 11.2 g/dL — ABNORMAL LOW (ref 13.0–17.0)
Hemoglobin: 11.9 g/dL — ABNORMAL LOW (ref 13.0–17.0)
Potassium: 4 mmol/L (ref 3.5–5.1)
Potassium: 3.8 mmol/L (ref 3.5–5.1)
Potassium: 3.7 mmol/L (ref 3.5–5.1)
Potassium: 3.8 mmol/L (ref 3.5–5.1)
Potassium: 3.4 mmol/L — ABNORMAL LOW (ref 3.5–5.1)
Sodium: 138 mmol/L (ref 135–145)
Sodium: 137 mmol/L (ref 135–145)
Sodium: 140 mmol/L (ref 135–145)
Sodium: 140 mmol/L (ref 135–145)
Sodium: 140 mmol/L (ref 135–145)
TCO2: 26 mmol/L (ref 22–32)
TCO2: 26 mmol/L (ref 22–32)
TCO2: 25 mmol/L (ref 22–32)
TCO2: 27 mmol/L (ref 22–32)
TCO2: 26 mmol/L (ref 22–32)

## 2023-07-30 LAB — PROTIME-INR
INR: 1.1 (ref 0.8–1.2)
Prothrombin Time: 14.3 seconds (ref 11.4–15.2)

## 2023-07-30 LAB — GLUCOSE, CAPILLARY
Glucose-Capillary: 110 mg/dL — ABNORMAL HIGH (ref 70–99)
Glucose-Capillary: 110 mg/dL — ABNORMAL HIGH (ref 70–99)
Glucose-Capillary: 115 mg/dL — ABNORMAL HIGH (ref 70–99)
Glucose-Capillary: 123 mg/dL — ABNORMAL HIGH (ref 70–99)
Glucose-Capillary: 127 mg/dL — ABNORMAL HIGH (ref 70–99)
Glucose-Capillary: 63 mg/dL — ABNORMAL LOW (ref 70–99)
Glucose-Capillary: 70 mg/dL (ref 70–99)

## 2023-07-30 LAB — BASIC METABOLIC PANEL
Anion gap: 9 (ref 5–15)
BUN: 19 mg/dL (ref 6–20)
CO2: 24 mmol/L (ref 22–32)
Calcium: 7 mg/dL — ABNORMAL LOW (ref 8.9–10.3)
Chloride: 102 mmol/L (ref 98–111)
Creatinine, Ser: 1.22 mg/dL (ref 0.61–1.24)
GFR, Estimated: >60 mL/min (ref >60)
Glucose, Bld: 116 mg/dL — ABNORMAL HIGH (ref 70–99)
Potassium: 4.1 mmol/L (ref 3.5–5.1)
Sodium: 135 mmol/L (ref 135–145)

## 2023-07-30 LAB — BASIC METABOLIC PANEL WITH GFR
Anion gap: 10 (ref 5–15)
BUN: 21 mg/dL — ABNORMAL HIGH (ref 6–20)
CO2: 25 mmol/L (ref 22–32)
Calcium: 6.9 mg/dL — ABNORMAL LOW (ref 8.9–10.3)
Chloride: 104 mmol/L (ref 98–111)
Creatinine, Ser: 1.28 mg/dL — ABNORMAL HIGH (ref 0.61–1.24)
GFR, Estimated: >60 mL/min
Glucose, Bld: 62 mg/dL — ABNORMAL LOW (ref 70–99)
Potassium: 3.5 mmol/L (ref 3.5–5.1)
Sodium: 139 mmol/L (ref 135–145)

## 2023-07-30 LAB — BPAM FFP
Blood Product Expiration Date: 202408122359
Blood Product Expiration Date: 202408122359
Blood Product Expiration Date: 202408122359
Blood Product Expiration Date: 202408122359
ISSUE DATE / TIME: 202408071022
ISSUE DATE / TIME: 202408071022
ISSUE DATE / TIME: 202408071053
ISSUE DATE / TIME: 202408071053
Unit Type and Rh: 8400
Unit Type and Rh: 8400
Unit Type and Rh: 8400
Unit Type and Rh: 8400

## 2023-07-30 LAB — FIBRINOGEN: Fibrinogen: 304 mg/dL (ref 210–475)

## 2023-07-30 LAB — MAGNESIUM
Magnesium: 2.1 mg/dL (ref 1.7–2.4)
Magnesium: 3.1 mg/dL — ABNORMAL HIGH (ref 1.7–2.4)

## 2023-07-30 LAB — HEMOGLOBIN AND HEMATOCRIT, BLOOD
HCT: 35.4 % — ABNORMAL LOW (ref 39.0–52.0)
Hemoglobin: 11.8 g/dL — ABNORMAL LOW (ref 13.0–17.0)

## 2023-07-30 LAB — APTT: aPTT: 34 seconds (ref 24–36)

## 2023-07-30 LAB — PREPARE RBC (CROSSMATCH)

## 2023-07-30 LAB — PLATELET COUNT: Platelets: 234 10*3/uL (ref 150–400)

## 2023-07-30 SURGERY — INSERTION OF IMPLANTABLE LEFT VENTRICULAR ASSIST DEVICE
Anesthesia: General | Site: Chest

## 2023-07-30 MED ORDER — POTASSIUM CHLORIDE 10 MEQ/50ML IV SOLN
10.0000 meq | Freq: Once | INTRAVENOUS | Status: AC
  Administered 2023-07-30: 10 meq via INTRAVENOUS
  Filled 2023-07-30: qty 50

## 2023-07-30 MED ORDER — ASPIRIN 300 MG RE SUPP: 300.0000 mg | Freq: Every day | RECTAL | Status: DC

## 2023-07-30 MED ORDER — ORAL CARE MOUTH RINSE
15.0000 mL | OROMUCOSAL | Status: DC
  Administered 2023-07-30 – 2023-08-01 (×20): 15 mL via OROMUCOSAL

## 2023-07-30 MED ORDER — ASPIRIN 325 MG PO TBEC
325.0000 mg | DELAYED_RELEASE_TABLET | Freq: Every day | ORAL | Status: DC
  Administered 2023-07-31 – 2023-08-01 (×2): 325 mg via ORAL
  Filled 2023-07-30 (×2): qty 1

## 2023-07-30 MED ORDER — VANCOMYCIN HCL 1000 MG IV SOLR
INTRAVENOUS | Status: AC
  Filled 2023-07-30: qty 20

## 2023-07-30 MED ORDER — CALCIUM CHLORIDE 10 % IV SOLN
INTRAVENOUS | Status: AC
  Filled 2023-07-30: qty 10

## 2023-07-30 MED ORDER — SODIUM CHLORIDE 0.9% FLUSH
10.0000 mL | Freq: Two times a day (BID) | INTRAVENOUS | Status: DC
  Administered 2023-07-30 – 2023-08-01 (×5): 10 mL
  Administered 2023-08-02: 50 mL
  Administered 2023-08-02: 40 mL
  Administered 2023-08-03 – 2023-08-08 (×10): 10 mL

## 2023-07-30 MED ORDER — DEXMEDETOMIDINE HCL IN NACL 400 MCG/100ML IV SOLN: 0.0000 ug/kg/h | INTRAVENOUS | Status: DC

## 2023-07-30 MED ORDER — CHLORHEXIDINE GLUCONATE 0.12 % MT SOLN
OROMUCOSAL | Status: AC
  Filled 2023-07-30: qty 15

## 2023-07-30 MED ORDER — HYDROMORPHONE HCL 1 MG/ML IJ SOLN
INTRAMUSCULAR | Status: AC
  Filled 2023-07-30: qty 0.5

## 2023-07-30 MED ORDER — BISACODYL 10 MG RE SUPP: 10.0000 mg | Freq: Every day | RECTAL | Status: DC

## 2023-07-30 MED ORDER — DEXMEDETOMIDINE HCL IN NACL 400 MCG/100ML IV SOLN
0.0000 ug/kg/h | INTRAVENOUS | Status: DC
  Administered 2023-07-30 – 2023-07-31 (×3): 1.2 ug/kg/h via INTRAVENOUS
  Filled 2023-07-30 (×3): qty 100

## 2023-07-30 MED ORDER — ASPIRIN 81 MG PO CHEW: 324.0000 mg | CHEWABLE_TABLET | Freq: Every day | ORAL | Status: DC

## 2023-07-30 MED ORDER — ACETAMINOPHEN 160 MG/5ML PO SOLN
1000.0000 mg | Freq: Four times a day (QID) | ORAL | Status: DC
  Administered 2023-07-30 – 2023-07-31 (×2): 1000 mg
  Filled 2023-07-30 (×2): qty 40.6

## 2023-07-30 MED ORDER — HEPARIN 6000 UNIT IRRIGATION SOLUTION
Status: AC
  Filled 2023-07-30: qty 500

## 2023-07-30 MED ORDER — PROPOFOL 10 MG/ML IV BOLUS
INTRAVENOUS | Status: DC | PRN
Start: 2023-07-30 — End: 2023-07-30
  Administered 2023-07-30: 50 mg via INTRAVENOUS
  Administered 2023-07-30: 20 mg via INTRAVENOUS
  Administered 2023-07-30: 30 mg via INTRAVENOUS

## 2023-07-30 MED ORDER — FENTANYL CITRATE (PF) 250 MCG/5ML IJ SOLN
INTRAMUSCULAR | Status: DC | PRN
  Administered 2023-07-30: 100 ug via INTRAVENOUS
  Administered 2023-07-30 (×5): 50 ug via INTRAVENOUS
  Administered 2023-07-30: 150 ug via INTRAVENOUS

## 2023-07-30 MED ORDER — VANCOMYCIN HCL 1000 MG IV SOLR: INTRAVENOUS | Status: DC | PRN

## 2023-07-30 MED ORDER — FENTANYL 2500MCG IN NS 250ML (10MCG/ML) PREMIX INFUSION
50.0000 ug/h | INTRAVENOUS | Status: DC
  Administered 2023-07-30: 50 ug/h via INTRAVENOUS
  Administered 2023-07-31: 200 ug/h via INTRAVENOUS
  Filled 2023-07-30 (×2): qty 250

## 2023-07-30 MED ORDER — DOCUSATE SODIUM 50 MG/5ML PO LIQD: 100.0000 mg | Freq: Two times a day (BID) | ORAL | Status: DC

## 2023-07-30 MED ORDER — ONDANSETRON HCL 4 MG/2ML IJ SOLN
INTRAMUSCULAR | Status: AC
  Filled 2023-07-30: qty 2

## 2023-07-30 MED ORDER — VASOPRESSIN 20 UNIT/ML IV SOLN
INTRAVENOUS | Status: DC | PRN
  Administered 2023-07-30: 1 [IU] via INTRAVENOUS
  Administered 2023-07-30: .5 [IU] via INTRAVENOUS

## 2023-07-30 MED ORDER — VANCOMYCIN HCL IN DEXTROSE 1-5 GM/200ML-% IV SOLN
1000.0000 mg | Freq: Two times a day (BID) | INTRAVENOUS | Status: AC
  Administered 2023-07-30 – 2023-07-31 (×3): 1000 mg via INTRAVENOUS
  Filled 2023-07-30 (×3): qty 200

## 2023-07-30 MED ORDER — ACETAMINOPHEN 500 MG PO TABS
1000.0000 mg | ORAL_TABLET | Freq: Four times a day (QID) | ORAL | Status: AC
  Administered 2023-07-31 – 2023-08-04 (×17): 1000 mg via ORAL
  Filled 2023-07-30 (×18): qty 2

## 2023-07-30 MED ORDER — ACETAMINOPHEN 325 MG PO TABS: 650.0000 mg | ORAL_TABLET | ORAL | Status: DC | PRN

## 2023-07-30 MED ORDER — ONDANSETRON HCL 4 MG/2ML IJ SOLN: 4.0000 mg | Freq: Four times a day (QID) | INTRAMUSCULAR | Status: DC | PRN

## 2023-07-30 MED ORDER — VASOPRESSIN 20 UNIT/ML IV SOLN
INTRAVENOUS | Status: AC
  Filled 2023-07-30: qty 1

## 2023-07-30 MED ORDER — MIDAZOLAM HCL 2 MG/2ML IJ SOLN
INTRAMUSCULAR | Status: AC
  Filled 2023-07-30: qty 2

## 2023-07-30 MED ORDER — SODIUM CHLORIDE 0.9 % IV SOLN: INTRAVENOUS | Status: DC | PRN

## 2023-07-30 MED ORDER — LACTATED RINGERS IV SOLN: INTRAVENOUS | Status: DC

## 2023-07-30 MED ORDER — DOCUSATE SODIUM 100 MG PO CAPS
200.0000 mg | ORAL_CAPSULE | Freq: Every day | ORAL | Status: DC
  Administered 2023-07-31 – 2023-08-07 (×7): 200 mg via ORAL
  Filled 2023-07-30 (×9): qty 2

## 2023-07-30 MED ORDER — ALBUMIN HUMAN 5 % IV SOLN
250.0000 mL | INTRAVENOUS | Status: AC | PRN
  Administered 2023-07-31: 12.5 g via INTRAVENOUS
  Filled 2023-07-30: qty 250

## 2023-07-30 MED ORDER — ROCURONIUM BROMIDE 10 MG/ML (PF) SYRINGE
PREFILLED_SYRINGE | INTRAVENOUS | Status: DC | PRN
  Administered 2023-07-30: 80 mg via INTRAVENOUS
  Administered 2023-07-30: 20 mg via INTRAVENOUS
  Administered 2023-07-30 (×2): 50 mg via INTRAVENOUS

## 2023-07-30 MED ORDER — BUPRENORPHINE HCL-NALOXONE HCL 8-2 MG SL SUBL: 1.0000 | SUBLINGUAL_TABLET | Freq: Every day | SUBLINGUAL | Status: DC

## 2023-07-30 MED ORDER — LACTATED RINGERS IV SOLN: 500.0000 mL | Freq: Once | INTRAVENOUS | Status: DC | PRN

## 2023-07-30 MED ORDER — PROTAMINE SULFATE 10 MG/ML IV SOLN
INTRAVENOUS | Status: AC
  Filled 2023-07-30: qty 25

## 2023-07-30 MED ORDER — PROTAMINE SULFATE 10 MG/ML IV SOLN
INTRAVENOUS | Status: DC | PRN
  Administered 2023-07-30: 20 mg via INTRAVENOUS
  Administered 2023-07-30: 230 mg via INTRAVENOUS

## 2023-07-30 MED ORDER — ORAL CARE MOUTH RINSE: 15.0000 mL | OROMUCOSAL | Status: DC | PRN

## 2023-07-30 MED ORDER — MIDAZOLAM HCL 2 MG/2ML IJ SOLN
INTRAMUSCULAR | Status: DC | PRN
  Administered 2023-07-30 (×2): 1 mg via INTRAVENOUS
  Administered 2023-07-30: 2 mg via INTRAVENOUS

## 2023-07-30 MED ORDER — ACETAMINOPHEN 10 MG/ML IV SOLN
INTRAVENOUS | Status: DC | PRN
  Administered 2023-07-30: 1000 mg via INTRAVENOUS

## 2023-07-30 MED ORDER — TRAMADOL HCL 50 MG PO TABS
50.0000 mg | ORAL_TABLET | ORAL | Status: DC | PRN
  Administered 2023-07-31: 50 mg via ORAL
  Administered 2023-08-01 – 2023-08-05 (×9): 100 mg via ORAL
  Filled 2023-07-30 (×7): qty 2
  Filled 2023-07-30: qty 1
  Filled 2023-07-30 (×2): qty 2

## 2023-07-30 MED ORDER — INSULIN ASPART 100 UNIT/ML IJ SOLN
0.0000 [IU] | INTRAMUSCULAR | Status: DC
  Administered 2023-07-30 (×2): 2 [IU] via SUBCUTANEOUS

## 2023-07-30 MED ORDER — SODIUM CHLORIDE 0.9% FLUSH: 3.0000 mL | INTRAVENOUS | Status: DC | PRN

## 2023-07-30 MED ORDER — MONTELUKAST SODIUM 10 MG PO TABS: 10.0000 mg | ORAL_TABLET | Freq: Every morning | ORAL | Status: DC

## 2023-07-30 MED ORDER — FENTANYL CITRATE PF 50 MCG/ML IJ SOSY: 50.0000 ug | PREFILLED_SYRINGE | Freq: Once | INTRAMUSCULAR | Status: DC

## 2023-07-30 MED ORDER — CHLORHEXIDINE GLUCONATE 0.12 % MT SOLN
15.0000 mL | OROMUCOSAL | Status: AC
  Administered 2023-07-30: 15 mL via OROMUCOSAL
  Filled 2023-07-30: qty 15

## 2023-07-30 MED ORDER — SODIUM CHLORIDE 0.9 % IV SOLN
250.0000 mL | INTRAVENOUS | Status: DC
  Administered 2023-07-31: 250 mL via INTRAVENOUS

## 2023-07-30 MED ORDER — SODIUM CHLORIDE 0.9 % IR SOLN
Status: DC | PRN
  Administered 2023-07-30: 4000 mL

## 2023-07-30 MED ORDER — LACTATED RINGERS IV SOLN: INTRAVENOUS | Status: DC | PRN

## 2023-07-30 MED ORDER — FLUCONAZOLE IN SODIUM CHLORIDE 200-0.9 MG/100ML-% IV SOLN
200.0000 mg | INTRAVENOUS | Status: AC
  Administered 2023-07-31 (×2): 200 mg via INTRAVENOUS
  Filled 2023-07-30 (×2): qty 100

## 2023-07-30 MED ORDER — HEPARIN 6000 UNIT IRRIGATION SOLUTION
Status: DC | PRN
Start: 2023-07-30 — End: 2023-07-30
  Administered 2023-07-30: 1

## 2023-07-30 MED ORDER — EPINEPHRINE 1 MG/10ML IJ SOSY
PREFILLED_SYRINGE | INTRAMUSCULAR | Status: AC
  Filled 2023-07-30: qty 10

## 2023-07-30 MED ORDER — SODIUM CHLORIDE 0.9% FLUSH
3.0000 mL | Freq: Two times a day (BID) | INTRAVENOUS | Status: DC
  Administered 2023-07-31 – 2023-08-08 (×13): 3 mL via INTRAVENOUS

## 2023-07-30 MED ORDER — HYDROMORPHONE HCL 1 MG/ML IJ SOLN
INTRAMUSCULAR | Status: DC | PRN
  Administered 2023-07-30 (×2): .5 mg via INTRAVENOUS

## 2023-07-30 MED ORDER — KETAMINE HCL 50 MG/5ML IJ SOSY
PREFILLED_SYRINGE | INTRAMUSCULAR | Status: AC
  Filled 2023-07-30: qty 5

## 2023-07-30 MED ORDER — ROCURONIUM BROMIDE 10 MG/ML (PF) SYRINGE
PREFILLED_SYRINGE | INTRAVENOUS | Status: AC
  Filled 2023-07-30: qty 20

## 2023-07-30 MED ORDER — FENTANYL CITRATE (PF) 250 MCG/5ML IJ SOLN
INTRAMUSCULAR | Status: AC
  Filled 2023-07-30: qty 5

## 2023-07-30 MED ORDER — KETAMINE HCL 10 MG/ML IJ SOLN
INTRAMUSCULAR | Status: DC | PRN
  Administered 2023-07-30: 30 mg via INTRAVENOUS

## 2023-07-30 MED ORDER — VANCOMYCIN HCL 1000 MG IV SOLR
INTRAVENOUS | Status: DC | PRN
  Administered 2023-07-30: 2000 mg via TOPICAL

## 2023-07-30 MED ORDER — PANTOPRAZOLE SODIUM 40 MG PO TBEC
40.0000 mg | DELAYED_RELEASE_TABLET | Freq: Every day | ORAL | Status: DC
  Administered 2023-08-01 – 2023-08-08 (×8): 40 mg via ORAL
  Filled 2023-07-30 (×8): qty 1

## 2023-07-30 MED ORDER — EPINEPHRINE HCL 5 MG/250ML IV SOLN IN NS
0.0000 ug/min | INTRAVENOUS | Status: DC
  Administered 2023-08-01: 2 ug/min via INTRAVENOUS
  Filled 2023-07-30: qty 250

## 2023-07-30 MED ORDER — SODIUM CHLORIDE 0.9 % IV SOLN
INTRAVENOUS | Status: AC | PRN
  Administered 2023-07-30: 1000 mL

## 2023-07-30 MED ORDER — MEXILETINE HCL 200 MG PO CAPS
200.0000 mg | ORAL_CAPSULE | Freq: Two times a day (BID) | ORAL | Status: DC
  Administered 2023-07-30 – 2023-07-31 (×2): 200 mg
  Filled 2023-07-30 (×2): qty 1

## 2023-07-30 MED ORDER — LIDOCAINE 2% (20 MG/ML) 5 ML SYRINGE
INTRAMUSCULAR | Status: AC
  Filled 2023-07-30: qty 5

## 2023-07-30 MED ORDER — DEXTROSE 50 % IV SOLN
0.0000 mL | INTRAVENOUS | Status: DC | PRN
  Administered 2023-07-30: 30 mL via INTRAVENOUS
  Filled 2023-07-30: qty 50

## 2023-07-30 MED ORDER — MORPHINE SULFATE (PF) 2 MG/ML IV SOLN
1.0000 mg | INTRAVENOUS | Status: DC | PRN
  Administered 2023-07-30: 4 mg via INTRAVENOUS
  Administered 2023-07-31: 2 mg via INTRAVENOUS
  Administered 2023-07-31 (×2): 4 mg via INTRAVENOUS
  Administered 2023-07-31: 2 mg via INTRAVENOUS
  Administered 2023-07-31 – 2023-08-01 (×8): 4 mg via INTRAVENOUS
  Administered 2023-08-01: 2 mg via INTRAVENOUS
  Administered 2023-08-01 – 2023-08-02 (×2): 4 mg via INTRAVENOUS
  Administered 2023-08-02 (×2): 2 mg via INTRAVENOUS
  Administered 2023-08-02 – 2023-08-03 (×3): 4 mg via INTRAVENOUS
  Filled 2023-07-30: qty 1
  Filled 2023-07-30 (×2): qty 2
  Filled 2023-07-30: qty 1
  Filled 2023-07-30 (×2): qty 2
  Filled 2023-07-30: qty 1
  Filled 2023-07-30 (×2): qty 2
  Filled 2023-07-30 (×2): qty 1
  Filled 2023-07-30: qty 2
  Filled 2023-07-30: qty 1
  Filled 2023-07-30 (×4): qty 2
  Filled 2023-07-30: qty 1
  Filled 2023-07-30 (×4): qty 2

## 2023-07-30 MED ORDER — SODIUM CHLORIDE 0.9 % IV SOLN: INTRAVENOUS | Status: DC

## 2023-07-30 MED ORDER — FLUCONAZOLE IN SODIUM CHLORIDE 400-0.9 MG/200ML-% IV SOLN
400.0000 mg | Freq: Once | INTRAVENOUS | Status: DC
  Filled 2023-07-30: qty 200

## 2023-07-30 MED ORDER — EPHEDRINE SULFATE-NACL 50-0.9 MG/10ML-% IV SOSY
PREFILLED_SYRINGE | INTRAVENOUS | Status: DC | PRN
  Administered 2023-07-30: 2.5 mg via INTRAVENOUS
  Administered 2023-07-30 (×2): 5 mg via INTRAVENOUS
  Administered 2023-07-30: 2.5 mg via INTRAVENOUS
  Administered 2023-07-30: 5 mg via INTRAVENOUS

## 2023-07-30 MED ORDER — FAMOTIDINE IN NACL 20-0.9 MG/50ML-% IV SOLN
20.0000 mg | Freq: Two times a day (BID) | INTRAVENOUS | Status: AC
  Administered 2023-07-30 – 2023-07-31 (×2): 20 mg via INTRAVENOUS
  Filled 2023-07-30 (×2): qty 50

## 2023-07-30 MED ORDER — PHENYLEPHRINE 80 MCG/ML (10ML) SYRINGE FOR IV PUSH (FOR BLOOD PRESSURE SUPPORT)
PREFILLED_SYRINGE | INTRAVENOUS | Status: AC
  Filled 2023-07-30: qty 10

## 2023-07-30 MED ORDER — ACETAMINOPHEN 160 MG/5ML PO SOLN: 650.0000 mg | Freq: Once | ORAL | Status: DC

## 2023-07-30 MED ORDER — EPHEDRINE 5 MG/ML INJ
INTRAVENOUS | Status: AC
  Filled 2023-07-30: qty 5

## 2023-07-30 MED ORDER — METOCLOPRAMIDE HCL 5 MG/ML IJ SOLN
10.0000 mg | Freq: Four times a day (QID) | INTRAMUSCULAR | Status: AC
  Administered 2023-07-30 – 2023-08-04 (×20): 10 mg via INTRAVENOUS
  Filled 2023-07-30 (×20): qty 2

## 2023-07-30 MED ORDER — CEFAZOLIN SODIUM-DEXTROSE 2-4 GM/100ML-% IV SOLN
2.0000 g | Freq: Three times a day (TID) | INTRAVENOUS | Status: AC
  Administered 2023-07-30 – 2023-08-01 (×6): 2 g via INTRAVENOUS
  Filled 2023-07-30 (×6): qty 100

## 2023-07-30 MED ORDER — HEMOSTATIC AGENTS (NO CHARGE) OPTIME
TOPICAL | Status: DC | PRN
  Administered 2023-07-30 (×3): 1 via TOPICAL
  Administered 2023-07-30: 3 via TOPICAL
  Administered 2023-07-30: 1 via TOPICAL

## 2023-07-30 MED ORDER — SODIUM CHLORIDE 0.9% FLUSH: 10.0000 mL | INTRAVENOUS | Status: DC | PRN

## 2023-07-30 MED ORDER — BISACODYL 5 MG PO TBEC
10.0000 mg | DELAYED_RELEASE_TABLET | Freq: Every day | ORAL | Status: DC
  Administered 2023-07-31 – 2023-08-07 (×7): 10 mg via ORAL
  Filled 2023-07-30 (×9): qty 2

## 2023-07-30 MED ORDER — ACETAMINOPHEN 10 MG/ML IV SOLN
INTRAVENOUS | Status: AC
  Filled 2023-07-30: qty 100

## 2023-07-30 MED ORDER — MAGNESIUM SULFATE 4 GM/100ML IV SOLN
4.0000 g | Freq: Once | INTRAVENOUS | Status: AC
  Administered 2023-07-30: 4 g via INTRAVENOUS
  Filled 2023-07-30: qty 100

## 2023-07-30 MED ORDER — SODIUM CHLORIDE 0.45 % IV SOLN: INTRAVENOUS | Status: DC | PRN

## 2023-07-30 MED ORDER — ONDANSETRON HCL 4 MG/2ML IJ SOLN
INTRAMUSCULAR | Status: DC | PRN
  Administered 2023-07-30: 4 mg via INTRAVENOUS

## 2023-07-30 MED ORDER — SODIUM CHLORIDE 0.9 % IV SOLN
600.0000 mg | Freq: Once | INTRAVENOUS | Status: AC
  Administered 2023-07-31: 600 mg via INTRAVENOUS
  Filled 2023-07-30: qty 10

## 2023-07-30 MED ORDER — INSULIN REGULAR(HUMAN) IN NACL 100-0.9 UT/100ML-% IV SOLN: INTRAVENOUS | Status: DC

## 2023-07-30 MED ORDER — SURGIFLO WITH THROMBIN (HEMOSTATIC MATRIX KIT) OPTIME
TOPICAL | Status: DC | PRN
  Administered 2023-07-30: 1 via TOPICAL

## 2023-07-30 MED ORDER — OXYCODONE HCL 5 MG PO TABS
5.0000 mg | ORAL_TABLET | ORAL | Status: DC | PRN
  Administered 2023-07-31 – 2023-08-03 (×12): 10 mg via ORAL
  Filled 2023-07-30 (×12): qty 2

## 2023-07-30 MED ORDER — HEPARIN SODIUM (PORCINE) 1000 UNIT/ML IJ SOLN
INTRAMUSCULAR | Status: DC | PRN
  Administered 2023-07-30: 28000 [IU] via INTRAVENOUS

## 2023-07-30 MED ORDER — PHENYLEPHRINE 80 MCG/ML (10ML) SYRINGE FOR IV PUSH (FOR BLOOD PRESSURE SUPPORT)
PREFILLED_SYRINGE | INTRAVENOUS | Status: DC | PRN
  Administered 2023-07-30: 80 ug via INTRAVENOUS

## 2023-07-30 MED ORDER — NOREPINEPHRINE 4 MG/250ML-% IV SOLN
0.0000 ug/min | INTRAVENOUS | Status: DC
  Administered 2023-07-31 (×2): 5 ug/min via INTRAVENOUS
  Filled 2023-07-30 (×3): qty 250

## 2023-07-30 MED ORDER — PROPOFOL 10 MG/ML IV BOLUS
INTRAVENOUS | Status: AC
  Filled 2023-07-30: qty 20

## 2023-07-30 MED ORDER — MAGNESIUM SULFATE 2 GM/50ML IV SOLN
2.0000 g | Freq: Once | INTRAVENOUS | Status: AC
  Administered 2023-07-30: 2 g via INTRAVENOUS
  Filled 2023-07-30: qty 50

## 2023-07-30 MED ORDER — POLYETHYLENE GLYCOL 3350 17 G PO PACK: 17.0000 g | PACK | Freq: Every day | ORAL | Status: DC

## 2023-07-30 MED ORDER — POTASSIUM CHLORIDE 10 MEQ/50ML IV SOLN
10.0000 meq | INTRAVENOUS | Status: AC
  Administered 2023-07-30 (×3): 10 meq via INTRAVENOUS

## 2023-07-30 MED ORDER — FENTANYL BOLUS VIA INFUSION
50.0000 ug | INTRAVENOUS | Status: DC | PRN
  Administered 2023-07-30: 100 ug via INTRAVENOUS
  Administered 2023-07-30 (×4): 50 ug via INTRAVENOUS
  Administered 2023-07-30: 25 ug via INTRAVENOUS
  Administered 2023-07-30: 100 ug via INTRAVENOUS
  Administered 2023-07-31: 25 ug via INTRAVENOUS
  Administered 2023-07-31: 50 ug via INTRAVENOUS
  Administered 2023-07-31: 25 ug via INTRAVENOUS
  Administered 2023-07-31: 50 ug via INTRAVENOUS

## 2023-07-30 MED ORDER — ACETAMINOPHEN 650 MG RE SUPP: 650.0000 mg | Freq: Once | RECTAL | Status: DC

## 2023-07-30 MED ORDER — SODIUM CHLORIDE 0.9 % IV SOLN
20.0000 ug | INTRAVENOUS | Status: AC
  Administered 2023-07-30: 20 ug via INTRAVENOUS
  Filled 2023-07-30: qty 5

## 2023-07-30 MED ORDER — MIDAZOLAM HCL 2 MG/2ML IJ SOLN
2.0000 mg | INTRAMUSCULAR | Status: DC | PRN
  Administered 2023-07-30 – 2023-07-31 (×6): 2 mg via INTRAVENOUS
  Filled 2023-07-30 (×6): qty 2

## 2023-07-30 MED ORDER — HEPARIN SODIUM (PORCINE) 1000 UNIT/ML IJ SOLN
INTRAMUSCULAR | Status: AC
  Filled 2023-07-30: qty 1

## 2023-07-30 MED FILL — Magnesium Sulfate Inj 50%: INTRAMUSCULAR | Qty: 10 | Status: AC

## 2023-07-30 MED FILL — Potassium Chloride Inj 2 mEq/ML: INTRAVENOUS | Qty: 40 | Status: AC

## 2023-07-30 MED FILL — Heparin Sodium (Porcine) Inj 1000 Unit/ML: Qty: 1000 | Status: AC

## 2023-07-30 SURGICAL SUPPLY — 113 items
ADAPTER CARDIO PERF ANTE/RETRO (ADAPTER) IMPLANT
ADH SKN CLS APL DERMABOND .7 (GAUZE/BANDAGES/DRESSINGS) ×2
ADPR PRFSN 84XANTGRD RTRGD (ADAPTER)
AGENT HMST KT MTR STRL THRMB (HEMOSTASIS) ×2
ANTEGRADE CPLG (MISCELLANEOUS) IMPLANT
APL PRP STRL LF DISP 70% ISPRP (MISCELLANEOUS) ×2
BAG DECANTER FOR FLEXI CONT (MISCELLANEOUS) ×2 IMPLANT
BLADE CLIPPER SURG (BLADE) ×2 IMPLANT
BLADE STERNUM SYSTEM 6 (BLADE) ×2 IMPLANT
BLADE SURG 12 STRL SS (BLADE) ×2 IMPLANT
BLADE SURG 15 STRL LF DISP TIS (BLADE) IMPLANT
BLADE SURG 15 STRL SS (BLADE)
CANISTER SUCT 3000ML PPV (MISCELLANEOUS) ×2 IMPLANT
CANNULA AORTIC ROOT 9FR (CANNULA) IMPLANT
CANNULA ARTERIAL NVNT 3/8 20FR (MISCELLANEOUS) ×2 IMPLANT
CANNULA MC2 2 STG 36/46 NON-V (CANNULA) IMPLANT
CANNULA SUMP PERICARDIAL (CANNULA) ×2 IMPLANT
CANNULA VENOUS LOW PROF 34X46 (CANNULA) ×2 IMPLANT
CATH FOLEY 2WAY SLVR 5CC 14FR (CATHETERS) ×2 IMPLANT
CATH FOLEY 2WAY SLVR 5CC 16FR (CATHETERS) IMPLANT
CATH HEART VENT LEFT (CATHETERS) IMPLANT
CATH ROBINSON RED A/P 18FR (CATHETERS) ×4 IMPLANT
CATH THORACIC 28FR (CATHETERS) IMPLANT
CATH THORACIC 28FR RT ANG (CATHETERS) IMPLANT
CHLORAPREP W/TINT 26 (MISCELLANEOUS) ×2 IMPLANT
CNTNR URN SCR LID CUP LEK RST (MISCELLANEOUS) IMPLANT
CONN ST 1/4X3/8 BEN (MISCELLANEOUS) IMPLANT
CONNECTOR BLAKE 2:1 CARIO BLK (MISCELLANEOUS) IMPLANT
CONT SPEC 4OZ STRL OR WHT (MISCELLANEOUS) ×2
CONTAINER PROTECT SURGISLUSH (MISCELLANEOUS) ×4 IMPLANT
DERMABOND ADVANCED .7 DNX12 (GAUZE/BANDAGES/DRESSINGS) IMPLANT
DRAIN CHANNEL 28F RND 3/8 FF (WOUND CARE) IMPLANT
DRAIN CHANNEL 32F RND 10.7 FF (WOUND CARE) IMPLANT
DRAPE CV SPLIT W-CLR ANES SCRN (DRAPES) ×2 IMPLANT
DRAPE INCISE IOBAN 66X45 STRL (DRAPES) ×4 IMPLANT
DRAPE PERI GROIN 82X75IN TIB (DRAPES) ×2 IMPLANT
DRAPE SLUSH/WARMER DISC (DRAPES) ×2 IMPLANT
DRSG AQUACEL AG ADV 3.5X14 (GAUZE/BANDAGES/DRESSINGS) ×2 IMPLANT
ELECT BLADE 4.0 EZ CLEAN MEGAD (MISCELLANEOUS) ×2
ELECT BLADE 6.5 EXT (BLADE) ×2 IMPLANT
ELECT CAUTERY BLADE 6.4 (BLADE) ×2 IMPLANT
ELECT REM PT RETURN 9FT ADLT (ELECTROSURGICAL) ×2
ELECTRODE BLDE 4.0 EZ CLN MEGD (MISCELLANEOUS) ×2 IMPLANT
ELECTRODE REM PT RTRN 9FT ADLT (ELECTROSURGICAL) ×2 IMPLANT
FELT TEFLON 6X6 (MISCELLANEOUS) ×2 IMPLANT
GAUZE 4X4 16PLY ~~LOC~~+RFID DBL (SPONGE) ×2 IMPLANT
GAUZE SPONGE 4X4 12PLY STRL (GAUZE/BANDAGES/DRESSINGS) ×4 IMPLANT
GLOVE BIO SURGEON STRL SZ 6.5 (GLOVE) IMPLANT
GLOVE BIO SURGEON STRL SZ7 (GLOVE) IMPLANT
GLOVE BIO SURGEON STRL SZ7.5 (GLOVE) ×6 IMPLANT
GLOVE BIOGEL PI IND STRL 6 (GLOVE) IMPLANT
GOWN STRL REUS W/ TWL LRG LVL3 (GOWN DISPOSABLE) ×8 IMPLANT
GOWN STRL REUS W/ TWL XL LVL3 (GOWN DISPOSABLE) ×4 IMPLANT
GOWN STRL REUS W/TWL LRG LVL3 (GOWN DISPOSABLE) ×16
GOWN STRL REUS W/TWL XL LVL3 (GOWN DISPOSABLE) ×4
HEMOSTAT POWDER SURGIFOAM 1G (HEMOSTASIS) ×8 IMPLANT
HEMOSTAT SURGICEL 2X14 (HEMOSTASIS) IMPLANT
INSERT FOGARTY SM (MISCELLANEOUS) IMPLANT
INSERT FOGARTY XLG (MISCELLANEOUS) IMPLANT
KIT BASIN OR (CUSTOM PROCEDURE TRAY) ×2 IMPLANT
KIT LVAD HEARTMATE 3 W-CNTRL (Prosthesis & Implant Heart) IMPLANT
KIT SUCTION CATH 14FR (SUCTIONS) ×2 IMPLANT
KIT TURNOVER KIT B (KITS) ×2 IMPLANT
LEAD PACING MYOCARDI (MISCELLANEOUS) IMPLANT
LINE VENT (MISCELLANEOUS) IMPLANT
NS IRRIG 1000ML POUR BTL (IV SOLUTION) ×10 IMPLANT
PACK OPEN HEART (CUSTOM PROCEDURE TRAY) ×2 IMPLANT
PAD ARMBOARD 7.5X6 YLW CONV (MISCELLANEOUS) ×4 IMPLANT
PAD DEFIB R2 (MISCELLANEOUS) ×2 IMPLANT
POSITIONER HEAD DONUT 9IN (MISCELLANEOUS) ×2 IMPLANT
POWDER SURGICEL 3.0 GRAM (HEMOSTASIS) IMPLANT
PUNCH AORTIC ROTATE 4.5MM 8IN (MISCELLANEOUS) ×2 IMPLANT
SEALANT PATCH FIBRIN 2X4IN (MISCELLANEOUS) IMPLANT
SEALANT SURG COSEAL 8ML (VASCULAR PRODUCTS) ×2 IMPLANT
SET MICROPUNCTURE 5F STIFF (MISCELLANEOUS) IMPLANT
SET MPS 3-ND DEL (MISCELLANEOUS) IMPLANT
SHEATH AVANTI 11CM 5FR (SHEATH) IMPLANT
SHEATH PINNACLE 5F 10CM (SHEATH) IMPLANT
SPONGE T-LAP 18X18 ~~LOC~~+RFID (SPONGE) ×8 IMPLANT
SPONGE T-LAP 4X18 ~~LOC~~+RFID (SPONGE) ×2 IMPLANT
SURGIFLO W/THROMBIN 8M KIT (HEMOSTASIS) IMPLANT
SUT ETHIBOND 2 0 SH (SUTURE) ×10
SUT ETHIBOND 2 0 SH 36X2 (SUTURE) ×10 IMPLANT
SUT ETHIBOND NAB MH 2-0 36IN (SUTURE) ×24 IMPLANT
SUT PROLENE 3 0 RB 1 (SUTURE) IMPLANT
SUT PROLENE 3 0 SH DA (SUTURE) ×4 IMPLANT
SUT PROLENE 4 0 RB 1 (SUTURE) ×12
SUT PROLENE 4-0 RB1 .5 CRCL 36 (SUTURE) ×8 IMPLANT
SUT PROLENE 5 0 C 1 36 (SUTURE) IMPLANT
SUT PROLENE 5 0 C1 (SUTURE) IMPLANT
SUT PROLENE 6 0 C 1 30 (SUTURE) IMPLANT
SUT SILK 1 MH (SUTURE) ×8 IMPLANT
SUT SILK 1 TIES 10X30 (SUTURE) ×2 IMPLANT
SUT SILK 2 0 SH CR/8 (SUTURE) ×4 IMPLANT
SUT STEEL 6MS V (SUTURE) ×4 IMPLANT
SUT STEEL SZ 6 DBL 3X14 BALL (SUTURE) ×2 IMPLANT
SUT TEM PAC WIRE 2 0 SH (SUTURE) IMPLANT
SUT VIC AB 1 CTX 36 (SUTURE) ×4
SUT VIC AB 1 CTX36XBRD ANBCTR (SUTURE) ×4 IMPLANT
SUT VIC AB 2-0 CTX 27 (SUTURE) ×4 IMPLANT
SUT VIC AB 3-0 SH 8-18 (SUTURE) ×2 IMPLANT
SUT VIC AB 3-0 X1 27 (SUTURE) ×4 IMPLANT
SYR 50ML LL SCALE MARK (SYRINGE) ×2 IMPLANT
SYR BULB IRRIG 60ML STRL (SYRINGE) IMPLANT
SYSTEM SAHARA CHEST DRAIN ATS (WOUND CARE) ×2 IMPLANT
TOWEL GREEN STERILE (TOWEL DISPOSABLE) ×2 IMPLANT
TRAY CATH LUMEN 1 20CM STRL (SET/KITS/TRAYS/PACK) ×2 IMPLANT
TRAY FOLEY SLVR 16FR TEMP STAT (SET/KITS/TRAYS/PACK) ×2 IMPLANT
TUBE CONNECTING 12X1/4 (SUCTIONS) ×2 IMPLANT
UNDERPAD 30X36 HEAVY ABSORB (UNDERPADS AND DIAPERS) ×2 IMPLANT
VENT LEFT HEART 12002 (CATHETERS)
WATER STERILE IRR 1000ML POUR (IV SOLUTION) ×4 IMPLANT
YANKAUER SUCT BULB TIP NO VENT (SUCTIONS) ×2 IMPLANT

## 2023-07-30 NOTE — Anesthesia Procedure Notes (Signed)
Procedure Name: Intubation Date/Time: 07/30/2023 8:39 AM  Performed by: Val Eagle, MDPre-anesthesia Checklist: Patient identified, Emergency Drugs available, Suction available and Patient being monitored Patient Re-evaluated:Patient Re-evaluated prior to induction Oxygen Delivery Method: Circle system utilized Preoxygenation: Pre-oxygenation with 100% oxygen Induction Type: IV induction Ventilation: Mask ventilation without difficulty Laryngoscope Size: Mac and 4 Grade View: Grade I Tube type: Oral Tube size: 8.0 mm Number of attempts: 1 Airway Equipment and Method: Stylet Placement Confirmation: ETT inserted through vocal cords under direct vision, positive ETCO2 and breath sounds checked- equal and bilateral Secured at: 23 cm Tube secured with: Tape Dental Injury: Teeth and Oropharynx as per pre-operative assessment

## 2023-07-30 NOTE — Progress Notes (Addendum)
Advanced Heart Failure Rounding Note  PCP-Cardiologist: None   Subjective:    07/23: Initial CO-OX 30%. Started on milrinone 0.25 + NE.  7/25: Milrinone increased to 0.375  RHC 8/5 on milrinone 0.375 after cath milrinone increased to 0.5 mcg.  RA = 6 RV = 54/11 PA = 53/33 (40) PCW = 17 Fick cardiac output/index = 4.2/2.3 PVR = 5.5 PAPi = 3.3 Ao sat = 91% PA sat = 62%, 62% Assessment: 1. Moderately reduced CO despite milrinone at 0.375 2. Moderate predominantly pre-capillary PAH with elevated PVR 3. PAPi 3.3  8/6 Diuresed with IV lasix. Brisk diuresis noted. Negative >6 liters.     Feels better today. Denies SOB.  Objective:   Weight Range: 68.2 kg Body mass index is 23.55 kg/m.   Vital Signs:   Temp:  [97.7 F (36.5 C)-98.1 F (36.7 C)] 98.1 F (36.7 C) (08/07 0500) Pulse Rate:  [59-127] 127 (08/06 2200) Resp:  [15-21] 21 (08/07 0600) BP: (85-119)/(67-94) 85/71 (08/07 0600) SpO2:  [92 %-100 %] 96 % (08/07 0600) Weight:  [68.2 kg] 68.2 kg (08/07 0500) Last BM Date : 07/29/23  Weight change: Filed Weights   07/28/23 0500 07/29/23 0500 07/30/23 0500  Weight: 66.8 kg 67 kg 68.2 kg   Intake/Output:   Intake/Output Summary (Last 24 hours) at 07/30/2023 0704 Last data filed at 07/30/2023 0600 Gross per 24 hour  Intake 331.81 ml  Output 6550 ml  Net -6218.19 ml    CVP6-7  Physical Exam  General:   No resp difficulty HEENT: normal Neck: supple. no JVD. Carotids 2+ bilat; no bruits. No lymphadenopathy or thryomegaly appreciated. Cor: PMI nondisplaced. Tachy Regular rate & rhythm. No rubs, gallops or murmurs. Lungs: clear Abdomen: soft, nontender, nondistended. No hepatosplenomegaly. No bruits or masses. Good bowel sounds. Extremities: no cyanosis, clubbing, rash, edema. RUE PICC Neuro: alert & orientedx3, cranial nerves grossly intact. moves all 4 extremities w/o difficulty. Affect pleasant   Telemetry  SR-ST 90-110s   Labs    CBC Recent Labs     07/29/23 0325 07/30/23 0519 07/30/23 0540  WBC 5.9 6.5  --   NEUTROABS 2.7 2.9  --   HGB 13.9 16.4 17.0  HCT 42.5 49.6 50.0  MCV 88.9 88.4  --   PLT 207 257  --    Basic Metabolic Panel Recent Labs    16/10/96 0325 07/30/23 0519 07/30/23 0540  NA 134* 136 139  K 3.5 3.6 3.7  CL 103 96*  --   CO2 22 25  --   GLUCOSE 194* 165*  --   BUN 21* 29*  --   CREATININE 1.22 1.35*  --   CALCIUM 8.2* 8.8*  --   MG 2.1 1.8  --     BNP (last 3 results) Recent Labs    07/14/23 0101  BNP 1,810.8*  2,494.6*    Imaging   DG CHEST PORT 1 VIEW  Result Date: 07/29/2023 CLINICAL DATA:  CHF, dyspnea EXAM: PORTABLE CHEST 1 VIEW COMPARISON:  Chest radiograph 07/26/2023 FINDINGS: The right upper extremity PICC is stable with tip in the lower SVC. The heart is enlarged, unchanged. The upper mediastinal contours are within normal limits. There is vascular congestion and probable mild pulmonary interstitial edema, unchanged. There is no new or worsening focal airspace opacity. There is no pleural effusion. There is no pneumothorax There is no acute osseous abnormality. IMPRESSION: Unchanged cardiomegaly and probable mild pulmonary interstitial edema. No new or worsening focal airspace opacity. Electronically Signed  By: Lesia Hausen M.D.   On: 07/29/2023 11:21   Medications:   Scheduled Medications:  buprenorphine-naloxone  1 tablet Sublingual Daily   Chlorhexidine Gluconate Cloth  6 each Topical Daily   digoxin  0.125 mg Oral Daily   enoxaparin (LOVENOX) injection  40 mg Subcutaneous Q24H   epinephrine  0-10 mcg/min Intravenous To OR   insulin   Intravenous To OR   loratadine  10 mg Oral QPM   magnesium sulfate  40 mEq Other To OR   melatonin  3 mg Oral QHS   mexiletine  200 mg Oral BID   mometasone-formoterol  2 puff Inhalation BID   montelukast  10 mg Oral q morning   phenylephrine  0-100 mcg/min Intravenous To OR   polyethylene glycol  17 g Oral Daily   potassium chloride  80 mEq  Other To OR   sodium chloride flush  10-40 mL Intracatheter Q12H   sodium chloride flush  3 mL Intravenous Q12H   sodium chloride flush  3 mL Intravenous Q12H   sodium chloride flush  3 mL Intravenous Q12H   tranexamic acid  15 mg/kg Intravenous To OR   tranexamic acid  2 mg/kg Intracatheter To OR   vancomycin  1,000 mg Other To OR   vasopressin  0.04 Units/min Intravenous To OR    Infusions:  sodium chloride     sodium chloride Stopped (07/28/23 0650)   sodium chloride      ceFAZolin (ANCEF) IV      ceFAZolin (ANCEF) IV     dexmedetomidine     DOBUTamine     DOPamine     fluconazole (DIFLUCAN) IV     heparin 30,000 units/NS 1000 mL solution for CELLSAVER     milrinone 0.5 mcg/kg/min (07/30/23 0600)   milrinone     nitroGLYCERIN     norepinephrine (LEVOPHED) Adult infusion     rifampin (RIFADIN) 600 mg in sodium chloride 0.9 % 100 mL IVPB     tranexamic acid (CYKLOKAPRON) 2,500 mg in sodium chloride 0.9 % 250 mL (10 mg/mL) infusion     vancomycin      PRN Medications: sodium chloride, sodium chloride, sodium chloride, acetaminophen, acetaminophen, acetaminophen, alum & mag hydroxide-simeth, fentaNYL (SUBLIMAZE) injection, fluticasone, HYDROcodone-acetaminophen, levalbuterol, LORazepam, nitroGLYCERIN, ondansetron (ZOFRAN) IV, mouth rinse, sodium chloride flush, sodium chloride flush, sodium chloride flush, sodium chloride flush   R/LHC 07/24: On milrinone 0.25 and NE 2    Ao = 97/70 (80) LV = 112/31 RA = 7 RV = 43/12 PA = 42/21 (30) PCW = 21 (v = 25) Fick cardiac output/index = 3.3/1.9 PVR = 2.7 WU SVR = 1778 FA sat = 95% PA sat = 67%, 67% PAPi = 3.0  Patient Profile   Joel York is a 37 y.o. male with HTN, ADD, asthma, hx drug abuse (now on suboxone x5 yrs), and seizures. Admitted with acute systolic heart failure. NYHA IV -> shock.   Assessment/Plan  Acute biventricular systolic heart failure >> cardiogenic shock - Admitted NYHA IV symptoms .Echo EF <20%,   RV mildly reduced, RV mod enlarged, estimated RV systolic pressure 53.22mmHg, LA mod dilated, RA mod dilated, mild-mod MR/TR - Etiology uncertain. ? 2/2 PVCs vs genetically mediated +/- hypertension.  - LHC with normal coronaries. RHC 07/24: Low CO (Fick CO/CI 3.3/1.9) and PAPi 3.0 on milrinone 0.375 . Off NE - cMRI:  LV markedly dilated EF 10% RV 17% NICM - Limited echo 07/29: LVEF < 20%, RV function improved. - RHC  8/5 CVP 6  PA 53/33 (40) PCW 17 Fick 4.2/2.3 PVR 5.5 PAPi 3.3 - CO-OX 72%  on milrinone to 0.5.  - CVP 7. Volume status much improved.  - Plan VAD today (not transplant candidate with active smoking). D/w [facility] transplant team.   2. Chest pressure - HsTrop 39>43>37>40, d/t demand ischemia - No CAD on cath  3. PVCs - snores, will need eventual sleep study  - Suppressed with Mexiletine 200 mg BID - TSH 4.9, T4 0.79 - Keep K>4 and Mg >2 - Supp K   4. AKI - resolved.  5. Tobacco use - smokes 3-6 cigarettes / day - cessation discussed  6. Hx drug abuse - Continue suboxone  7.. Hypokalemia/hypomag - Supp K/Mag   Tonye Becket, NP  7:04 AM  Agree with above.   Remains on milrinone 0.5. Diuresed 6L yesterday. Feeling better. Co-ox 72% Renal function stable.   General:  Sitting up No resp difficulty HEENT: normal Neck: supple JVP 7-8  Carotids 2+ bilat; no bruits. No lymphadenopathy or thryomegaly appreciated. Cor: PMI laterally displaced. Regular rate & rhythm. +s3 Lungs: clear Abdomen: soft, nontender, nondistended. No hepatosplenomegaly. No bruits or masses. Good bowel sounds. Extremities: no cyanosis, clubbing, rash, edema Neuro: alert & orientedx3, cranial nerves grossly intact. moves all 4 extremities w/o difficulty. Affect pleasant  Remains tenuous but improved. On milrinone 0.5. Co-ox ok. VOlume status much improved. For LVAD today.  D/w Dr. Donata Clay.   CRITICAL CARE Performed by: [name]  Total critical care time: 40 minutes  Critical  care time was exclusive of separately billable procedures and treating other patients.  Critical care was necessary to treat or prevent imminent or life-threatening deterioration.  Critical care was time spent personally by me (independent of midlevel providers or residents) on the following activities: development of treatment plan with patient and/or surrogate as well as nursing, discussions with consultants, evaluation of patient's response to treatment, examination of patient, obtaining history from patient or surrogate, ordering and performing treatments and interventions, ordering and review of laboratory studies, ordering and review of radiographic studies, pulse oximetry and re-evaluation of patient's condition.  [name], MD  9:41 AM

## 2023-07-30 NOTE — Progress Notes (Signed)
     [address]       [place] [address]             [phone]       [facility] ROUNDS  Doing well. Not bleeding LVAD with good flows On epi and levo and NO Weaning vent tomorrow

## 2023-07-30 NOTE — Brief Op Note (Signed)
07/14/2023 - 07/30/2023  12:46 PM  PATIENT:  Joel York  37 y.o. male  PRE-OPERATIVE DIAGNOSIS:  Non ICM  POST-OPERATIVE DIAGNOSIS: Non ISCHEMIC CARDIOMYOPATHY  PROCEDURE:  Procedure(s): INSERTION OF IMPLANTABLE LEFT VENTRICULAR ASSIST DEVICE (N/A) TRANSESOPHAGEAL ECHOCARDIOGRAM (N/A)  SURGEON:  Surgeons and Role:    Lovett Sox, MD - Primary  PHYSICIAN ASSISTANT: WAYNE GOLD PA-C  ASSISTANTS: Tanda Rockers RNFA   ANESTHESIA:   general  EBL:400 cc  BLOOD ADMINISTERED: 2 UNITS FFP  DRAINS:  MEDIASTINAL CHEST TUBES  L pleural tube  LOCAL MEDICATIONS USED:  NONE  SPECIMEN:  Source of Specimen:  VENTRICLE CORE  DISPOSITION OF SPECIMEN:  PATHOLOGY  COUNTS:  YES  TOURNIQUET:  * No tourniquets in log *  DICTATION: .Other Dictation: Dictation Number PENDING  PLAN OF CARE: Admit to inpatient   PATIENT DISPOSITION:  ICU - intubated and hemodynamically stable.   Delay start of Pharmacological VTE agent (>24hrs) due to surgical blood loss or risk of bleeding: yes  COMPLICATIONS: NO KNOWN

## 2023-07-30 NOTE — Progress Notes (Signed)
Pt transported to pre-op room w/ OR tech and pt's wife Penni Bombard. Report given to pre-op RN and called to anesthesia.

## 2023-07-30 NOTE — Anesthesia Procedure Notes (Addendum)
Central Venous Catheter Insertion Performed by: Val Eagle, MD, anesthesiologist Start/End8/06/2023 7:46 AM, 07/30/2023 7:54 AM Patient location: OR. Preanesthetic checklist: patient identified, IV checked, site marked, risks and benefits discussed, surgical consent, monitors and equipment checked, pre-op evaluation, timeout performed and anesthesia consent Position: supine Patient sedated Hand hygiene performed  and maximum sterile barriers used  Catheter size: 8.5 Fr Sheath introducer Procedure performed using ultrasound guided technique. Ultrasound Notes:anatomy identified, needle tip was noted to be adjacent to the nerve/plexus identified, no ultrasound evidence of intravascular and/or intraneural injection and image(s) printed for medical record Attempts: 1 Following insertion, line sutured, dressing applied and Biopatch. Post procedure assessment: blood return through all ports, free fluid flow and no air  Patient tolerated the procedure well with no immediate complications.

## 2023-07-30 NOTE — Anesthesia Procedure Notes (Addendum)
Central Venous Catheter Insertion Performed by: Val Eagle, MD, anesthesiologist Start/End8/06/2023 7:46 AM, 07/30/2023 7:53 AM Patient location: OR. Preanesthetic checklist: patient identified, risks and benefits discussed, surgical consent, monitors and equipment checked, pre-op evaluation, timeout performed and anesthesia consent Position: supine Hand hygiene performed  and maximum sterile barriers used  Total catheter length 16. Central line was placed.Triple lumen Procedure performed without using ultrasound guided technique. Attempts: 1 Following insertion, dressing applied and Biopatch. Post procedure assessment: blood return through all ports and free fluid flow  Patient tolerated the procedure well with no immediate complications. Additional procedure comments: 3 lumen slick through 8.5 right internal jugular introducer.

## 2023-07-30 NOTE — Anesthesia Postprocedure Evaluation (Signed)
Anesthesia Post Note  Patient: Joel York  Procedure(s) Performed: INSERTION OF IMPLANTABLE LEFT VENTRICULAR ASSIST DEVICE (Chest) TRANSESOPHAGEAL ECHOCARDIOGRAM     Patient location during evaluation: SICU Anesthesia Type: General Level of consciousness: sedated Pain management: pain level controlled Vital Signs Assessment: post-procedure vital signs reviewed and stable Respiratory status: patient remains intubated per anesthesia plan Cardiovascular status: stable Postop Assessment: no apparent nausea or vomiting Anesthetic complications: no   No notable events documented.  Last Vitals:  Vitals:   07/30/23 0824 07/30/23 0825  BP:    Pulse: (!) 102 98  Resp: 20   Temp:    SpO2: 92% 93%    Last Pain:  Vitals:   07/30/23 0734  TempSrc: Oral  PainSc: 0-No pain                 Devita Nies

## 2023-07-30 NOTE — Progress Notes (Signed)
Pre Procedure note for inpatients:   Joel York has been scheduled for Procedure(s): INSERTION OF IMPLANTABLE LEFT VENTRICULAR ASSIST DEVICE (N/A) TRANSESOPHAGEAL ECHOCARDIOGRAM (N/A) today. The various methods of treatment have been discussed with the patient. After consideration of the risks, benefits and treatment options the patient has consented to the planned procedure.   The patient has been seen and labs reviewed. There are no changes in the patient's condition to prevent proceeding with the planned procedure today.  Recent labs:  Lab Results  Component Value Date   WBC 6.5 07/30/2023   HGB 17.0 07/30/2023   HCT 50.0 07/30/2023   PLT 257 07/30/2023   GLUCOSE 165 (H) 07/30/2023   CHOL 74 07/15/2023   TRIG 140 07/15/2023   HDL 16 (L) 07/15/2023   LDLCALC 30 07/15/2023   ALT 47 (H) 07/30/2023   AST 34 07/30/2023   NA 139 07/30/2023   K 3.7 07/30/2023   CL 96 (L) 07/30/2023   CREATININE 1.35 (H) 07/30/2023   BUN 29 (H) 07/30/2023   CO2 25 07/30/2023   TSH 4.982 (H) 07/14/2023   INR 1.1 07/30/2023   HGBA1C 6.0 (H) 07/30/2023   MICROALBUR <3.0 (H) 07/14/2023    Lovett Sox, MD 07/30/2023 7:59 AM

## 2023-07-30 NOTE — Transfer of Care (Signed)
Immediate Anesthesia Transfer of Care Note  Patient: Joel York  Procedure(s) Performed: INSERTION OF IMPLANTABLE LEFT VENTRICULAR ASSIST DEVICE (Chest) TRANSESOPHAGEAL ECHOCARDIOGRAM  Patient Location: ICU  Anesthesia Type:General  Level of Consciousness: sedated, unresponsive, and Patient remains intubated per anesthesia plan  Airway & Oxygen Therapy: Patient Spontanous Breathing and Patient remains intubated per anesthesia plan  Post-op Assessment: Report given to RN and Post -op Vital signs reviewed and stable  Post vital signs: Reviewed and stable  Last Vitals:  Vitals Value Taken Time  BP    Temp 36.1 C 07/30/23 1438  Pulse 117 07/30/23 1438  Resp    SpO2 95 % 07/30/23 1438  Vitals shown include unfiled device data.  Last Pain:  Vitals:   07/30/23 0734  TempSrc: Oral  PainSc: 0-No pain      Patients Stated Pain Goal: 0 (07/28/23 0926)  Complications: No notable events documented.

## 2023-07-30 NOTE — Anesthesia Procedure Notes (Signed)
Central Venous Catheter Insertion Performed by: Val Eagle, MD, anesthesiologist Start/End8/06/2023 7:35 AM, 07/30/2023 7:45 AM Patient location: OR. Preanesthetic checklist: patient identified, IV checked, site marked, risks and benefits discussed, surgical consent, monitors and equipment checked, pre-op evaluation, timeout performed and anesthesia consent Patient sedated Hand hygiene performed  and maximum sterile barriers used  Catheter size: 9 Fr Total catheter length 10. MAC introducer Procedure performed using ultrasound guided technique. Ultrasound Notes:anatomy identified, needle tip was noted to be adjacent to the nerve/plexus identified, no ultrasound evidence of intravascular and/or intraneural injection and image(s) printed for medical record Attempts: 1 Following insertion, line sutured, dressing applied and Biopatch. Post procedure assessment: blood return through all ports, free fluid flow and no air  Patient tolerated the procedure well with no immediate complications.

## 2023-07-30 NOTE — Anesthesia Procedure Notes (Signed)
Arterial Line Insertion Start/End8/06/2023 7:45 AM, 07/30/2023 7:50 AM Performed by: Val Eagle, MD, anesthesiologist  Patient location: Pre-op. Preanesthetic checklist: patient identified, IV checked, site marked, risks and benefits discussed, surgical consent, monitors and equipment checked, pre-op evaluation and timeout performed Lidocaine 1% used for infiltration and patient sedated Left, radial was placed Catheter size: 20 G Hand hygiene performed  and Seldinger technique used Allen's test indicative of satisfactory collateral circulation Attempts: 2 Procedure performed without using ultrasound guided technique. Following insertion, dressing applied and Biopatch. Post procedure assessment: normal and unchanged  Patient tolerated the procedure well with no immediate complications.

## 2023-07-30 NOTE — Anesthesia Preprocedure Evaluation (Addendum)
Anesthesia Evaluation  Patient identified by MRN, date of birth, ID band Patient awake    Reviewed: Allergy & Precautions, NPO status , Patient's Chart, lab work & pertinent test results  History of Anesthesia Complications Negative for: history of anesthetic complications  Airway Mallampati: III  TM Distance: >3 FB Neck ROM: Full    Dental  (+) Teeth Intact, Dental Advisory Given   Pulmonary asthma , neg recent URI, Current Smoker   breath sounds clear to auscultation       Cardiovascular (-) angina +CHF  (-) Past MI  Rhythm:Regular Rate:Tachycardia   1. Left ventricular ejection fraction, by estimation, is <20%. The left  ventricle has severely decreased function. The left ventricle demonstrates  global hypokinesis. The left ventricular internal cavity size was severely  dilated. No LV thrombus.   2. Limited echo, no doppler, only LV evaluation.   FINDINGS   Left Ventricle: Left ventricular ejection fraction, by estimation, is  <20%. The left ventricle has severely decreased function. The left  ventricle demonstrates global hypokinesis. Definity contrast agent was  given IV to delineate the left ventricular  endocardial borders. The left ventricular internal cavity size was  severely dilated. There is no left ventricular hypertrophy.     Neuro/Psych Seizures -,  PSYCHIATRIC DISORDERS Anxiety        GI/Hepatic Neg liver ROS,GERD  ,,  Endo/Other  negative endocrine ROS    Renal/GU Renal InsufficiencyRenal diseaseLab Results      Component                Value               Date                      CREATININE               1.35 (H)            07/30/2023            Lab Results      Component                Value               Date                      K                        3.7                 07/30/2023                Musculoskeletal negative musculoskeletal ROS (+)    Abdominal   Peds  Hematology Lab  Results      Component                Value               Date                      WBC                      6.5                 07/30/2023                HGB  17.0                07/30/2023                HCT                      50.0                07/30/2023                MCV                      88.4                07/30/2023                PLT                      257                 07/30/2023              Anesthesia Other Findings 1. 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 ? 2/2 PVCs vs genetically mediated +/- hypertension.  - LHC with normal coronaries. RHC 07/24: Low CO (Fick CO/CI 3.3/1.9) and PAPi 3.0 on milrinone 0.375 . Off NE - cMRI:  LV markedly dilated EF 10% RV 17% NICM - Limited echo 07/29: LVEF < 20%, RV function improved. - RHC  8/5 CVP 6  PA 53/33 (40) PCW 17 Fick 4.2/2.3 PVR 5.5 PAPi 3.3 - CO-OX 64% on milrinone to 0.5.    Reproductive/Obstetrics                             Anesthesia Physical Anesthesia Plan  ASA: 4  Anesthesia Plan: General   Post-op Pain Management:    Induction: Intravenous  PONV Risk Score and Plan: 1 and Ondansetron and Treatment may vary due to age or medical condition  Airway Management Planned: Oral ETT  Additional Equipment: Arterial line, CVP, PA Cath, TEE and Ultrasound Guidance Line Placement  Intra-op Plan:   Post-operative Plan: Post-operative intubation/ventilation  Informed Consent: I have reviewed the patients History and Physical, chart, labs and discussed the procedure including the risks, benefits and alternatives for the proposed anesthesia with the patient or authorized representative who has indicated his/her understanding and acceptance.     Dental  advisory given  Plan Discussed with: Anesthesiologist  Anesthesia Plan Comments:         Anesthesia Quick Evaluation

## 2023-07-30 NOTE — Progress Notes (Signed)
CSW met at bedside with patient's wife. Patient intubated and stable post surgery. Wife shared relief that the surgery is over and ready to support patient through recovery. CSW provided supportive intervention and will continue to follow throughout implant hospitalization. Lasandra Beech, LCSW, CCSW-MCS [phone]

## 2023-07-30 NOTE — Op Note (Signed)
Joel York, Joel York MEDICAL RECORD NO: [id number] ACCOUNT NO: [id number] DATE OF BIRTH: 28-Jan-1986 FACILITY: [facility] LOCATION: [facility] PHYSICIAN: Kerin Perna III, MD  Operative Report   DATE OF PROCEDURE: 07/30/2023  OPERATION:  Implantation of HeartMate 3, left ventricular assist device.  PREOPERATIVE DIAGNOSES:  Nonischemic cardiomyopathy with cardiogenic shock, inotrope dependent.  POSTOPERATIVE DIAGNOSES:  Nonischemic cardiomyopathy with cardiogenic shock, inotrope dependent.  SURGEON:  Mikey Bussing, MD  ASSISTANT:  Gershon Crane, PA-C.  A surgical assistant was needed for this procedure due to the complexity of the operation and the high acuity of illness of the patient. The first assistant was needed to perform the implantation of the left ventricular assist device with sutures and  suture management in the LV apex as well as grafting an end-to-side aortic anastomosis of the outflow graft from the pump.  The first assistant was also needed for general exposure, suctioning and suture management and gentle tissue retraction.  ANESTHESIA:  General by Dr. Corky Sox.  DESCRIPTION OF PROCEDURE:  The patient was brought from ICU to preoperative holding where I examined the patient and reviewed the procedure again with the patient and his family including the expected benefits, indications and risks of the operation.   Informed consent was documented and the patient was stable.  He was then taken to the operating room by the anesthesia team and the OR nursing team.  He was placed supine on the operating room table and remained stable.  He was induced for general  anesthesia and intubated.  A PA catheter was placed through the left IJ and a double lumen catheter was placed in the right IJ.  He was then positioned and the chest, abdomen and legs were prepped and draped as a sterile field.  A proper timeout was  performed.  A sternal incision was made and a sternotomy was performed.  The  pericardium was opened and a pericardial pump pocket was created by taking down portions of the left hemidiaphragm and opening the left pleural space.  An incision was made for the driveline  tunnel in the lower mid abdomen and an exit site for the power cord from the tunnel was placed in the left upper quadrant.  The tunneling rods were then placed.  A sternal retractor was placed and the pericardium was suspended.  Heparin was administered.  Pursestrings were placed in the ascending aorta and right atrium and the patient was cannulated and placed on cardiopulmonary bypass when the post-heparin ACT  was documented as being therapeutic.  CO2 was insufflated into the operative field.  The heart was positioned on several warm lap pads to expose the left apex.  The apex was identified.  An incision was made.  A Foley catheter balloon was then placed  into the LV apex and a coronary knife was used to core out the LV apical myocardium.  The Foley catheter was removed in its entirety and bleeding was controlled and hemostasis was obtained.  Twelve 2-0 Ethibond pledgeted sutures were placed around the  circumference of the LV apex and placed in the sewing ring.  The apical disk was then sewn to the LV apex by tying down the interrupted pledgeted sutures sequentially.  Next volume was left into the pump to fill the LV and the head was placed in a steep Trendelenburg  position and while the heart was filled to avoid air entrapment the inflow cannula was locked in place into the LV apex and secured.  The pump  was then placed into the pericardial pocket and the power cord brought out and guided through the tunnels previously created.  Next, the outflow graft was cut to the appropriate length and bevel and brought around the right atrial cannula.  A partial clamp was placed on the ascending aorta.  An aortotomy was performed.  An end-to-side anastomosis was created with the 12 mm  Hemashield graft using running 4-0  Prolene, which was reinforced with a light layer of medical adhesive CoSeal.  Clamp was removed and air was vented from the graft.  There was good hemostasis.  I then placed epicardial pacing wires in the right atrium and right ventricle and the patient was prepared for separation from cardiopulmonary bypass after the controller was connected to the power cord.  The ventilator was resumed after the lungs were expanded and inhaled nitric oxide was started.  Inotropic support was started by anesthesia and the patient was slowly and successfully transitioned off cardiopulmonary bypass to support with the HeartMate  3 pump at a speed of 5300 RPMs.  Echo showed good position of the inflow cannula.  The patient remained stable and protamine was administered to reverse the heparin.  The right atrial cannula was removed and then the arterial aortic cannula was removed.   The patient remained hemodynamically stable with VAD flow of 4.2 liters per minute on low to moderate dose inotropic support.  Mediastinal drains were placed in the anterior mediastinum into the VAD pocket where the pump was placed and into the left pleural space and brought out through separate incisions.  After hemostasis was obtained the superior pericardial fat was closed over  the aorta.  The anterior mediastinal drain was placed above the pericardial closure.  The sternum was then closed with interrupted steel wire and the patient remained stable with reapproximation of the sternum and twisting of the wires.  The chest tubes  were connected to underwater seal Pleur-evac system.  The pectoralis fascia was closed with a running Vicryl.  Subcutaneous layer and skin were closed with a running Vicryl.  A nylon skin stitch was placed to reapproximate the exit site of the power cord.   Sterile dressings were placed on all the incisions and the patient was then transferred back to the ICU in stable condition after a chest x-ray was obtained showing  the equipment and lines to be in proper position, without pneumothorax.   PUS D: 07/30/2023 4:35:01 pm T: 07/30/2023 5:30:00 pm  JOB: [id number]/ [id number]

## 2023-07-31 ENCOUNTER — Inpatient Hospital Stay (HOSPITAL_COMMUNITY): Payer: Medicaid Other

## 2023-07-31 ENCOUNTER — Encounter (HOSPITAL_COMMUNITY): Payer: Self-pay | Admitting: Cardiothoracic Surgery

## 2023-07-31 DIAGNOSIS — Z515 Encounter for palliative care: Secondary | ICD-10-CM | POA: Diagnosis not present

## 2023-07-31 DIAGNOSIS — I272 Pulmonary hypertension, unspecified: Secondary | ICD-10-CM | POA: Diagnosis not present

## 2023-07-31 DIAGNOSIS — R0989 Other specified symptoms and signs involving the circulatory and respiratory systems: Secondary | ICD-10-CM | POA: Diagnosis not present

## 2023-07-31 DIAGNOSIS — R570 Cardiogenic shock: Secondary | ICD-10-CM | POA: Diagnosis not present

## 2023-07-31 DIAGNOSIS — E872 Acidosis, unspecified: Secondary | ICD-10-CM | POA: Diagnosis not present

## 2023-07-31 DIAGNOSIS — Z95811 Presence of heart assist device: Secondary | ICD-10-CM | POA: Diagnosis not present

## 2023-07-31 DIAGNOSIS — I5082 Biventricular heart failure: Secondary | ICD-10-CM | POA: Diagnosis not present

## 2023-07-31 DIAGNOSIS — I428 Other cardiomyopathies: Secondary | ICD-10-CM | POA: Diagnosis not present

## 2023-07-31 DIAGNOSIS — I110 Hypertensive heart disease with heart failure: Secondary | ICD-10-CM | POA: Diagnosis not present

## 2023-07-31 DIAGNOSIS — I5021 Acute systolic (congestive) heart failure: Secondary | ICD-10-CM | POA: Diagnosis not present

## 2023-07-31 DIAGNOSIS — I255 Ischemic cardiomyopathy: Secondary | ICD-10-CM | POA: Diagnosis not present

## 2023-07-31 DIAGNOSIS — Z1152 Encounter for screening for COVID-19: Secondary | ICD-10-CM | POA: Diagnosis not present

## 2023-07-31 DIAGNOSIS — Z4682 Encounter for fitting and adjustment of non-vascular catheter: Secondary | ICD-10-CM | POA: Diagnosis not present

## 2023-07-31 DIAGNOSIS — I5023 Acute on chronic systolic (congestive) heart failure: Secondary | ICD-10-CM | POA: Diagnosis not present

## 2023-07-31 DIAGNOSIS — J918 Pleural effusion in other conditions classified elsewhere: Secondary | ICD-10-CM | POA: Diagnosis not present

## 2023-07-31 DIAGNOSIS — J45909 Unspecified asthma, uncomplicated: Secondary | ICD-10-CM | POA: Diagnosis not present

## 2023-07-31 DIAGNOSIS — I2489 Other forms of acute ischemic heart disease: Secondary | ICD-10-CM | POA: Diagnosis not present

## 2023-07-31 DIAGNOSIS — R569 Unspecified convulsions: Secondary | ICD-10-CM | POA: Diagnosis not present

## 2023-07-31 DIAGNOSIS — R918 Other nonspecific abnormal finding of lung field: Secondary | ICD-10-CM | POA: Diagnosis not present

## 2023-07-31 LAB — POCT I-STAT 7, (LYTES, BLD GAS, ICA,H+H)
Acid-base deficit: 4 mmol/L — ABNORMAL HIGH (ref 0.0–2.0)
Acid-base deficit: 4 mmol/L — ABNORMAL HIGH (ref 0.0–2.0)
Acid-base deficit: 5 mmol/L — ABNORMAL HIGH (ref 0.0–2.0)
Acid-base deficit: 2 mmol/L (ref 0.0–2.0)
Bicarbonate: 23.1 mmol/L (ref 20.0–28.0)
Bicarbonate: 22.9 mmol/L (ref 20.0–28.0)
Bicarbonate: 22.7 mmol/L (ref 20.0–28.0)
Bicarbonate: 25.2 mmol/L (ref 20.0–28.0)
Calcium, Ion: 1.07 mmol/L — ABNORMAL LOW (ref 1.15–1.40)
Calcium, Ion: 1.06 mmol/L — ABNORMAL LOW (ref 1.15–1.40)
Calcium, Ion: 1.07 mmol/L — ABNORMAL LOW (ref 1.15–1.40)
Calcium, Ion: 1.07 mmol/L — ABNORMAL LOW (ref 1.15–1.40)
HCT: 36 % — ABNORMAL LOW (ref 39.0–52.0)
HCT: 36 % — ABNORMAL LOW (ref 39.0–52.0)
HCT: 35 % — ABNORMAL LOW (ref 39.0–52.0)
HCT: 37 % — ABNORMAL LOW (ref 39.0–52.0)
Hemoglobin: 12.2 g/dL — ABNORMAL LOW (ref 13.0–17.0)
Hemoglobin: 12.2 g/dL — ABNORMAL LOW (ref 13.0–17.0)
Hemoglobin: 11.9 g/dL — ABNORMAL LOW (ref 13.0–17.0)
Hemoglobin: 12.6 g/dL — ABNORMAL LOW (ref 13.0–17.0)
O2 Saturation: 97 %
O2 Saturation: 97 %
O2 Saturation: 97 %
O2 Saturation: 95 %
Patient temperature: 37.2
Patient temperature: 37
Patient temperature: 37
Patient temperature: 37.2
Potassium: 4.5 mmol/L (ref 3.5–5.1)
Potassium: 4.9 mmol/L (ref 3.5–5.1)
Potassium: 4.8 mmol/L (ref 3.5–5.1)
Potassium: 4 mmol/L (ref 3.5–5.1)
Sodium: 139 mmol/L (ref 135–145)
Sodium: 139 mmol/L (ref 135–145)
Sodium: 139 mmol/L (ref 135–145)
Sodium: 137 mmol/L (ref 135–145)
TCO2: 24 mmol/L (ref 22–32)
TCO2: 24 mmol/L (ref 22–32)
TCO2: 24 mmol/L (ref 22–32)
TCO2: 27 mmol/L (ref 22–32)
pCO2 arterial: 47.4 mmHg (ref 32–48)
pCO2 arterial: 50.1 mmHg — ABNORMAL HIGH (ref 32–48)
pCO2 arterial: 50.6 mmHg — ABNORMAL HIGH (ref 32–48)
pCO2 arterial: 50.8 mmHg — ABNORMAL HIGH (ref 32–48)
pH, Arterial: 7.297 — ABNORMAL LOW (ref 7.35–7.45)
pH, Arterial: 7.269 — ABNORMAL LOW (ref 7.35–7.45)
pH, Arterial: 7.261 — ABNORMAL LOW (ref 7.35–7.45)
pH, Arterial: 7.305 — ABNORMAL LOW (ref 7.35–7.45)
pO2, Arterial: 105 mmHg (ref 83–108)
pO2, Arterial: 106 mmHg (ref 83–108)
pO2, Arterial: 109 mmHg — ABNORMAL HIGH (ref 83–108)
pO2, Arterial: 87 mmHg (ref 83–108)

## 2023-07-31 LAB — COOXEMETRY PANEL
Carboxyhemoglobin: 2.3 % — ABNORMAL HIGH (ref 0.5–1.5)
Carboxyhemoglobin: 2.2 % — ABNORMAL HIGH (ref 0.5–1.5)
Methemoglobin: 0.9 % (ref 0.0–1.5)
Methemoglobin: 1.6 % — ABNORMAL HIGH (ref 0.0–1.5)
O2 Saturation: 71.8 %
O2 Saturation: 84.5 %
Total hemoglobin: 11.3 g/dL — ABNORMAL LOW (ref 12.0–16.0)
Total hemoglobin: 12.3 g/dL (ref 12.0–16.0)

## 2023-07-31 LAB — GLUCOSE, CAPILLARY
Glucose-Capillary: 131 mg/dL — ABNORMAL HIGH (ref 70–99)
Glucose-Capillary: 153 mg/dL — ABNORMAL HIGH (ref 70–99)
Glucose-Capillary: 161 mg/dL — ABNORMAL HIGH (ref 70–99)
Glucose-Capillary: 180 mg/dL — ABNORMAL HIGH (ref 70–99)
Glucose-Capillary: 65 mg/dL — ABNORMAL LOW (ref 70–99)
Glucose-Capillary: 77 mg/dL (ref 70–99)
Glucose-Capillary: 90 mg/dL (ref 70–99)
Glucose-Capillary: 97 mg/dL (ref 70–99)

## 2023-07-31 MED ORDER — FUROSEMIDE 10 MG/ML IJ SOLN: 20.0000 mg | Freq: Two times a day (BID) | INTRAMUSCULAR | Status: DC

## 2023-07-31 MED ORDER — FUROSEMIDE 10 MG/ML IJ SOLN
20.0000 mg | Freq: Once | INTRAMUSCULAR | Status: AC
  Administered 2023-07-31: 20 mg via INTRAVENOUS

## 2023-07-31 MED ORDER — DEXMEDETOMIDINE HCL IN NACL 400 MCG/100ML IV SOLN
0.2000 ug/kg/h | INTRAVENOUS | Status: DC
  Administered 2023-07-31: 0.3 ug/kg/h via INTRAVENOUS
  Filled 2023-07-31: qty 100

## 2023-07-31 MED ORDER — MONTELUKAST SODIUM 10 MG PO TABS
10.0000 mg | ORAL_TABLET | Freq: Every morning | ORAL | Status: DC
  Administered 2023-07-31 – 2023-08-08 (×9): 10 mg via ORAL
  Filled 2023-07-31 (×9): qty 1

## 2023-07-31 MED ORDER — LIDOCAINE 5 % EX PTCH
2.0000 | MEDICATED_PATCH | CUTANEOUS | Status: DC
  Administered 2023-07-31 – 2023-08-07 (×8): 2 via TRANSDERMAL
  Filled 2023-07-31 (×9): qty 2

## 2023-07-31 MED ORDER — CYCLOBENZAPRINE HCL 10 MG PO TABS
5.0000 mg | ORAL_TABLET | Freq: Three times a day (TID) | ORAL | Status: DC | PRN
  Administered 2023-07-31 – 2023-08-08 (×10): 5 mg via ORAL
  Filled 2023-07-31 (×11): qty 1

## 2023-07-31 MED ORDER — DIGOXIN 125 MCG PO TABS: 0.1250 mg | ORAL_TABLET | Freq: Every day | ORAL | Status: DC

## 2023-07-31 MED ORDER — BUPRENORPHINE HCL-NALOXONE HCL 8-2 MG SL SUBL
1.0000 | SUBLINGUAL_TABLET | Freq: Every day | SUBLINGUAL | Status: DC
  Administered 2023-08-01 – 2023-08-08 (×8): 1 via SUBLINGUAL
  Filled 2023-07-31 (×8): qty 1

## 2023-07-31 MED ORDER — SIMETHICONE 80 MG PO CHEW
80.0000 mg | CHEWABLE_TABLET | Freq: Four times a day (QID) | ORAL | Status: DC
  Administered 2023-07-31 – 2023-08-06 (×27): 80 mg via ORAL
  Filled 2023-07-31 (×28): qty 1

## 2023-07-31 MED ORDER — GABAPENTIN 300 MG PO CAPS
300.0000 mg | ORAL_CAPSULE | Freq: Two times a day (BID) | ORAL | Status: DC
  Administered 2023-07-31 – 2023-08-03 (×7): 300 mg via ORAL
  Filled 2023-07-31 (×7): qty 1

## 2023-07-31 MED ORDER — LEVALBUTEROL HCL 1.25 MG/0.5ML IN NEBU
1.2500 mg | INHALATION_SOLUTION | Freq: Four times a day (QID) | RESPIRATORY_TRACT | Status: DC
  Administered 2023-07-31: 1.25 mg via RESPIRATORY_TRACT
  Filled 2023-07-31 (×2): qty 0.5

## 2023-07-31 MED ORDER — WARFARIN - PHYSICIAN DOSING INPATIENT: Freq: Every day | Status: DC

## 2023-07-31 MED ORDER — MIDAZOLAM HCL 2 MG/2ML IJ SOLN: 2.0000 mg | Freq: Four times a day (QID) | INTRAMUSCULAR | Status: DC | PRN

## 2023-07-31 MED ORDER — WARFARIN SODIUM 2.5 MG PO TABS
2.5000 mg | ORAL_TABLET | Freq: Once | ORAL | Status: AC
  Administered 2023-07-31: 2.5 mg via ORAL
  Filled 2023-07-31: qty 1

## 2023-07-31 MED ORDER — MEXILETINE HCL 200 MG PO CAPS
200.0000 mg | ORAL_CAPSULE | Freq: Two times a day (BID) | ORAL | Status: DC
  Administered 2023-07-31 – 2023-08-08 (×16): 200 mg via ORAL
  Filled 2023-07-31 (×17): qty 1

## 2023-07-31 MED ORDER — POLYETHYLENE GLYCOL 3350 17 G PO PACK
17.0000 g | PACK | Freq: Every day | ORAL | Status: DC
  Administered 2023-07-31 – 2023-08-05 (×6): 17 g via ORAL
  Filled 2023-07-31 (×8): qty 1

## 2023-07-31 MED ORDER — FENTANYL CITRATE PF 50 MCG/ML IJ SOSY
25.0000 ug | PREFILLED_SYRINGE | INTRAMUSCULAR | Status: DC | PRN
  Administered 2023-08-01 – 2023-08-02 (×2): 50 ug via INTRAVENOUS
  Filled 2023-07-31 (×2): qty 1

## 2023-07-31 MED ORDER — ACETAMINOPHEN 325 MG PO TABS
650.0000 mg | ORAL_TABLET | ORAL | Status: DC | PRN
  Administered 2023-08-06 – 2023-08-07 (×5): 650 mg via ORAL
  Filled 2023-07-31 (×5): qty 2

## 2023-07-31 MED ORDER — LEVALBUTEROL HCL 1.25 MG/0.5ML IN NEBU
1.2500 mg | INHALATION_SOLUTION | Freq: Four times a day (QID) | RESPIRATORY_TRACT | Status: DC
  Administered 2023-07-31 – 2023-08-03 (×8): 1.25 mg via RESPIRATORY_TRACT
  Filled 2023-07-31 (×18): qty 0.5

## 2023-07-31 MED ORDER — SODIUM BICARBONATE 8.4 % IV SOLN
50.0000 meq | Freq: Once | INTRAVENOUS | Status: AC
  Administered 2023-07-31: 50 meq via INTRAVENOUS
  Filled 2023-07-31: qty 50

## 2023-07-31 MED ORDER — DIGOXIN 125 MCG PO TABS
0.1250 mg | ORAL_TABLET | Freq: Every day | ORAL | Status: DC
  Administered 2023-07-31 – 2023-08-08 (×9): 0.125 mg via ORAL
  Filled 2023-07-31 (×9): qty 1

## 2023-07-31 NOTE — Progress Notes (Signed)
EVENING ROUNDS NOTE :     [address]       [place] [address]             [phone]                 1 Day Post-Op Procedure(s) (LRB): INSERTION OF IMPLANTABLE LEFT VENTRICULAR ASSIST DEVICE (N/A) TRANSESOPHAGEAL ECHOCARDIOGRAM (N/A)   Total Length of Stay:  LOS: 17 days  Events:   Extubated this am. Resting Responding to lasix    BP (!) 86/72   Pulse (!) 124   Temp 97.9 F (36.6 C)   Resp 20   Ht 5\' 7"  (1.702 m)   Wt 70 kg Comment: With LVAD controller  SpO2 97%   BMI 24.17 kg/m   PAP: (16-48)/(9-35) 36/23 CVP:  [10 mmHg-31 mmHg] 14 mmHg CO:  [4 L/min-7.1 L/min] 5.3 L/min CI:  [2.25 L/min/m2-3.94 L/min/m2] 3 L/min/m2  Vent Mode: PSV;CPAP FiO2 (%):  [40 %-50 %] 40 % Set Rate:  [4 bmp-16 bmp] 4 bmp Vt Set:  [530 mL] 530 mL PEEP:  [5 cmH20] 5 cmH20 Pressure Support:  [10 cmH20] 10 cmH20 Plateau Pressure:  [16 cmH20-22 cmH20] 18 cmH20   sodium chloride Stopped (07/31/23 0525)   sodium chloride 1 mL/hr at 07/31/23 1500   sodium chloride 10 mL/hr at 07/31/23 1500    ceFAZolin (ANCEF) IV Stopped (07/31/23 1426)   dexmedetomidine (PRECEDEX) IV infusion 0.3 mcg/kg/hr (07/31/23 1500)   epinephrine 2 mcg/min (07/31/23 1500)   lactated ringers     lactated ringers     lactated ringers 10 mL/hr at 07/31/23 1500   milrinone 0.5 mcg/kg/min (07/31/23 1500)   norepinephrine (LEVOPHED) Adult infusion 5 mcg/min (07/31/23 1500)   vancomycin Stopped (07/31/23 1159)    I/O last 3 completed shifts: In: 6280.3 [P.O.:100; I.V.:3237.8; Blood:861; NG/GT:100; IV Piggyback:1981.5] Out: 4364 [Urine:3155; Blood:579; Chest Tube:630]      Latest Ref Rng & Units 07/31/2023   10:40 AM 07/31/2023    9:16 AM 07/31/2023    4:36 AM  CBC  Hemoglobin 13.0 - 17.0 g/dL 84.1  66.0  63.0   Hematocrit 39.0 - 52.0 % 35.0  36.0  36.0        Latest Ref Rng & Units 07/31/2023   10:40 AM 07/31/2023    9:16 AM 07/31/2023    4:36 AM  BMP  Sodium 135 - 145 mmol/L 139  139  139    Potassium 3.5 - 5.1 mmol/L 4.8  4.9  4.5     ABG    Component Value Date/Time   PHART 7.261 (L) 07/31/2023 1040   PCO2ART 50.6 (H) 07/31/2023 1040   PO2ART 109 (H) 07/31/2023 1040   HCO3 22.7 07/31/2023 1040   TCO2 24 07/31/2023 1040   ACIDBASEDEF 5.0 (H) 07/31/2023 1040   O2SAT 71.8 07/31/2023 1124       Joel Greathouse, MD 07/31/2023 3:11 PM

## 2023-07-31 NOTE — Plan of Care (Signed)
  Problem: Education: Goal: Knowledge of General Education information will improve Description: Including pain rating scale, medication(s)/side effects and non-pharmacologic comfort measures Outcome: Progressing   Problem: Clinical Measurements: Goal: Will remain free from infection Outcome: Progressing   Problem: Clinical Measurements: Goal: Diagnostic test results will improve Outcome: Progressing   Problem: Clinical Measurements: Goal: Respiratory complications will improve Outcome: Progressing   Problem: Clinical Measurements: Goal: Cardiovascular complication will be avoided Outcome: Progressing   Problem: Pain Managment: Goal: General experience of comfort will improve Outcome: Progressing   Problem: Safety: Goal: Ability to remain free from injury will improve Outcome: Progressing   Problem: Skin Integrity: Goal: Risk for impaired skin integrity will decrease Outcome: Progressing

## 2023-07-31 NOTE — TOC Progression Note (Signed)
Transition of Care [facility]) - Progression Note    Patient Details  Name: Joel York MRN: [id number] Date of Birth: [birth year]  Transition of Care [facility]) CM/SW Contact  Nicanor Bake Phone Number: [phone] 07/31/2023, 1:37 PM  Clinical Narrative:  CSW met with the pt and wife, Joel York at the bedside. CSW checked in with the wife to see how she was doing and the pt. Joel York stated that they both are doing well and do not need anything at this time. CSW stated that she will continue to follow the patient during his hospital stay. TOC will continue to follow.     Expected Discharge Plan: Home/Self Care Barriers to Discharge: Continued Medical Work up  Expected Discharge Plan and Services       Living arrangements for the past 2 months: Single Family Home                                       Social Determinants of Health (SDOH) Interventions SDOH Screenings   Food Insecurity: No Food Insecurity (07/14/2023)  Housing: Low Risk  (07/14/2023)  Transportation Needs: No Transportation Needs (07/14/2023)  Utilities: Not At Risk (07/14/2023)  Alcohol Screen: Low Risk  (07/14/2023)  Financial Resource Strain: High Risk (07/14/2023)  Physical Activity: Sufficiently Active (07/14/2023)  Stress: Stress Concern Present (07/14/2023)  Tobacco Use: High Risk (07/23/2023)    Readmission Risk Interventions     No data to display

## 2023-07-31 NOTE — Progress Notes (Addendum)
Advanced Heart Failure VAD Team Note  PCP-Cardiologist: None   Subjective:   8/7 S/P HMIII LVAD  Currently on Milrinone 0.5 mcg + Norepi 5 mcg+ Epi 2 mcg.   Remains intubated. iNO 5 PPM.   CVP 16-17  PA 34/22(27) CO 4.5 CI 2.5 CO-OX 69%   LDH 427  INR 1.4   Follows commands.   LVAD INTERROGATION:  HeartMate III LVAD:   Flow 4.5 liters/min, speed 5400, power 4, PI 2.4. No PI events overnight.   Objective:    Vital Signs:   Temp:  [96.4 F (35.8 C)-100.6 F (38.1 C)] 99.5 F (37.5 C) (08/08 0615) Pulse Rate:  [97-119] 109 (08/08 0615) Resp:  [0-35] 18 (08/08 0400) BP: (80-114)/(58-91) 86/72 (08/08 0600) SpO2:  [91 %-99 %] 97 % (08/08 0615) Arterial Line BP: (73-114)/(50-84) 85/69 (08/08 0615) FiO2 (%):  [50 %] 50 % (08/08 0400) Weight:  [70 kg] 70 kg (08/08 0500) Last BM Date : 07/29/23 Mean arterial Pressure 70s  Intake/Output:   Intake/Output Summary (Last 24 hours) at 07/31/2023 0701 Last data filed at 07/31/2023 0600 Gross per 24 hour  Intake 5880.91 ml  Output 3199 ml  Net 2681.91 ml    CVP 16-17 Physical Exam    General: Intubated  HEENT: ETT Neck: supple. JVP  to jaw  . Carotids 2+ bilat; no bruits. No lymphadenopathy or thyromegaly appreciated. RIJ LIJ swan  Cor: Mechanical heart sounds with LVAD hum present. Lungs: clear Abdomen: soft, nontender, nondistended. No hepatosplenomegaly. No bruits or masses. Good bowel sounds. Driveline: Dressing intact  Extremities: no cyanosis, clubbing, rash, edema Neuro: Intubated. Follows commands MAE x4  Telemetry   ST 100s  EKG    N/A  Labs   Basic Metabolic Panel: Recent Labs  Lab 07/29/23 0325 07/30/23 0519 07/30/23 0540 07/30/23 1151 07/30/23 1221 07/30/23 1301 07/30/23 1307 07/30/23 1504 07/30/23 1521 07/30/23 2000 07/31/23 0428 07/31/23 0436  NA 134* 136   < > 140   < > 140   < > 139 141 135  140 136 139  K 3.5 3.6   < > 3.8   < > 3.4*   < > 3.5 3.5 4.1  4.3 4.5 4.5  CL 103 96*    < > 100  --  102  --  104  --  102 107  --   CO2 22 25  --   --   --   --   --  25  --  24 22  --   GLUCOSE 194* 165*   < > 144*  --  156*  --  62*  --  116* 91  --   BUN 21* 29*   < > 25*  --  25*  --  21*  --  19 16  --   CREATININE 1.22 1.35*   < > 1.20  --  1.30*  --  1.28*  --  1.22 1.09  --   CALCIUM 8.2* 8.8*  --   --   --   --   --  6.9*  --  7.0* 7.2*  --   MG 2.1 1.8  --   --   --   --   --  2.1  --  3.1* 2.5*  --   PHOS  --   --   --   --   --   --   --   --   --   --  3.1  --    < > =  values in this interval not displayed.    Liver Function Tests: Recent Labs  Lab 07/30/23 0519 07/31/23 0428  AST 34 129*  ALT 47* 35  ALKPHOS 85 53  BILITOT 0.5 2.0*  PROT 6.6 5.8*  ALBUMIN 3.6 3.5   No results for input(s): "LIPASE", "AMYLASE" in the last 168 hours. No results for input(s): "AMMONIA" in the last 168 hours.  CBC: Recent Labs  Lab 07/26/23 0428 07/27/23 0404 07/28/23 0320 07/28/23 0902 07/29/23 0325 07/30/23 0519 07/30/23 0540 07/30/23 1126 07/30/23 1151 07/30/23 1307 07/30/23 1504 07/30/23 1521 07/30/23 2000 07/31/23 0436  WBC 6.8 6.3 6.1  --  5.9 6.5  --   --   --   --  12.0*  --  9.0  --   NEUTROABS 3.5 2.8 2.6  --  2.7 2.9  --   --   --   --   --   --   --   --   HGB 15.3 14.4 14.4   < > 13.9 16.4   < > 11.8*   < > 12.2* 12.2* 11.9* 11.8*  11.9* 12.2*  HCT 46.4 44.8 44.1   < > 42.5 49.6   < > 35.4*   < > 36.0* 37.2* 35.0* 36.0*  35.0* 36.0*  MCV 90.6 89.1 89.1  --  88.9 88.4  --   --   --   --  88.8  --  89.1  --   PLT 186 200 199  --  207 257  --  234  --   --  176  176  --  168  --    < > = values in this interval not displayed.    INR: Recent Labs  Lab 07/30/23 0624 07/30/23 1504 07/31/23 0428  INR 1.1 1.3*  DUPLICATE 1.4*    Other results: EKG:    Imaging   DG Abd 1 View  Result Date: 07/30/2023 CLINICAL DATA:  Feeding tube placement EXAM: ABDOMEN - 1 VIEW COMPARISON:  Chest radiograph dated 07/30/2023 at 1410 hours FINDINGS:  Enteric tube terminates in the mid gastric body. LVAD. Left chest tube. Right IJ Swan-Ganz catheter. Right IJ venous catheter, incompletely visualized. These are all unchanged from recent chest radiograph. IMPRESSION: Enteric tube terminates in the mid gastric body. Electronically Signed   By: Charline Bills M.D.   On: 07/30/2023 19:23   DG CHEST PORT 1 VIEW  Result Date: 07/30/2023 CLINICAL DATA:  LVAD insertion.  Elective surgery. EXAM: PORTABLE CHEST 1 VIEW COMPARISON:  Radiograph yesterday FINDINGS: Interval median sternotomy. Endotracheal tube tip 3.9 cm from the carina. Swan-Ganz catheter from left internal jugular approach with tip in the region of the main pulmonary outflow tract. Right internal jugular central venous catheter tip overlies the SVC. LVAD demonstrated. Left-sided chest tube in place. Cardiomegaly is similar or slightly improved. Improvement in pulmonary edema. No pneumothorax. No large pleural effusion IMPRESSION: 1. Interval median sternotomy. 2. Support apparatus as described. 3. Improvement in pulmonary edema. Electronically Signed   By: Narda Rutherford M.D.   On: 07/30/2023 15:05   EP STUDY  Result Date: 07/30/2023 See surgical note for result.  DG CHEST PORT 1 VIEW  Result Date: 07/29/2023 CLINICAL DATA:  CHF, dyspnea EXAM: PORTABLE CHEST 1 VIEW COMPARISON:  Chest radiograph 07/26/2023 FINDINGS: The right upper extremity PICC is stable with tip in the lower SVC. The heart is enlarged, unchanged. The upper mediastinal contours are within normal limits. There is vascular congestion and probable mild pulmonary interstitial  edema, unchanged. There is no new or worsening focal airspace opacity. There is no pleural effusion. There is no pneumothorax There is no acute osseous abnormality. IMPRESSION: Unchanged cardiomegaly and probable mild pulmonary interstitial edema. No new or worsening focal airspace opacity. Electronically Signed   By: Lesia Hausen M.D.   On: 07/29/2023 11:21      Medications:     Scheduled Medications:  acetaminophen  1,000 mg Oral Q6H   Or   acetaminophen (TYLENOL) oral liquid 160 mg/5 mL  1,000 mg Per Tube Q6H   acetaminophen (TYLENOL) oral liquid 160 mg/5 mL  650 mg Per Tube Once   Or   acetaminophen  650 mg Rectal Once   aspirin EC  325 mg Oral Daily   Or   aspirin  324 mg Per Tube Daily   Or   aspirin  300 mg Rectal Daily   bisacodyl  10 mg Oral Daily   Or   bisacodyl  10 mg Rectal Daily   [START ON 08/02/2023] buprenorphine-naloxone  1 tablet Sublingual Daily   Chlorhexidine Gluconate Cloth  6 each Topical Daily   digoxin  0.125 mg Oral Daily   docusate sodium  200 mg Oral Daily   fentaNYL (SUBLIMAZE) injection  50 mcg Intravenous Once   insulin aspart  0-24 Units Subcutaneous Q4H   loratadine  10 mg Oral QPM   melatonin  3 mg Oral QHS   metoCLOPramide (REGLAN) injection  10 mg Intravenous Q6H   mexiletine  200 mg Per Tube BID   mometasone-formoterol  2 puff Inhalation BID   montelukast  10 mg Per Tube q morning   mouth rinse  15 mL Mouth Rinse Q2H   [START ON 08/01/2023] pantoprazole  40 mg Oral Daily   polyethylene glycol  17 g Per Tube Daily   sodium chloride flush  10-40 mL Intracatheter Q12H   sodium chloride flush  3 mL Intravenous Q12H    Infusions:  sodium chloride Stopped (07/31/23 0525)   sodium chloride 250 mL (07/31/23 0613)   sodium chloride 10 mL/hr at 07/31/23 0600   albumin human Stopped (07/31/23 0155)    ceFAZolin (ANCEF) IV 200 mL/hr at 07/31/23 0600   dexmedetomidine (PRECEDEX) IV infusion 0.8 mcg/kg/hr (07/31/23 0600)   epinephrine 2 mcg/min (07/31/23 0600)   fentaNYL infusion INTRAVENOUS 150 mcg/hr (07/31/23 0600)   fluconazole (DIFLUCAN) IV 200 mg (07/31/23 5188)   insulin Stopped (07/30/23 1959)   lactated ringers     lactated ringers     lactated ringers 10 mL/hr at 07/31/23 0600   milrinone 0.5 mcg/kg/min (07/31/23 0600)   norepinephrine (LEVOPHED) Adult infusion 8 mcg/min (07/31/23 0600)    rifampin (RIFADIN) 600 mg in sodium chloride 0.9 % 100 mL IVPB     vancomycin Stopped (07/30/23 2231)    PRN Medications: sodium chloride, acetaminophen, albumin human, dextrose, fentaNYL, fentaNYL (SUBLIMAZE) injection, fluticasone, HYDROcodone-acetaminophen, lactated ringers, levalbuterol, LORazepam, midazolam, morphine injection, nitroGLYCERIN, ondansetron (ZOFRAN) IV, oxyCODONE, traMADol   Patient Profile   Joel York is a 37 y.o. male with HTN, ADD, asthma, hx drug abuse (now on suboxone x5 yrs), and seizures. Admitted with acute systolic heart failure. NYHA IV -> shock.   S/P HMIII LVAD   Assessment/Plan:   1. S/P HMIII LVAD  VAD Parameters stable.  Remains on Epi 2 mcg, milrinone 0.5 mcg , and norepi 5 mcg. Wean iNo per CT surgery.  CO-OX 69%. Swan # PA 34/22 (27) CO 4.5 CI 2.5  Follow daily LDH and INR.  INR 1.4.  CBC pending.    2. Acute biventricular systolic heart failure >> cardiogenic shock - Admitted NYHA IV symptoms .Echo EF <20%,  RV mildly reduced, RV mod enlarged, estimated RV systolic pressure 53.78mmHg, LA mod dilated, RA mod dilated, mild-mod MR/TR - Etiology uncertain. ? 2/2 PVCs vs genetically mediated +/- hypertension.  - LHC with normal coronaries. RHC 07/24: Low CO (Fick CO/CI 3.3/1.9) and PAPi 3.0 on milrinone 0.375 . Off NE - cMRI:  LV markedly dilated EF 10% RV 17% NICM - Limited echo 07/29: LVEF < 20%, RV function improved. - RHC  8/5 CVP 6  PA 53/33 (40) PCW 17 Fick 4.2/2.3 PVR 5.5 PAPi 3.3 - Discussed with [facility] not a candidate for transplant with smoking.  - S/P HMIII as above.  - Start to gently diurese when ok with CT surgery.   3. Chest pressure - HsTrop 39>43>37>40, d/t demand ischemia - No CAD on cath   4. PVCs - snores, will need eventual sleep study  - Suppressed with Mexiletine 200 mg BID - TSH 4.9, T4 0.79 - Keep K>4 and Mg >2   5. AKI - resolved.   6. Tobacco use - smokes 3-6 cigarettes / day - cessation discussed   6.  Hx drug abuse - Designer, fashion/clothing.   I reviewed the LVAD parameters from today, and compared the results to the patient's prior recorded data.  No programming changes were made.  The LVAD is functioning within specified parameters.  The patient performs LVAD self-test daily.  LVAD interrogation was negative for any significant power changes, alarms or PI events/speed drops.  LVAD equipment check completed and is in good working order.  Back-up equipment present.   LVAD education done on emergency procedures and precautions and reviewed exit site care.  Length of Stay: 17  Tonye Becket, NP 07/31/2023, 7:01 AM  VAD Team --- VAD ISSUES ONLY--- Pager [phone] (7am - 7am)  Advanced Heart Failure Team  Pager [phone] (M-F; 7a - 5p)  Please contact [facility] for night-coverage after hours (5p -7a ) and weekends on [email or web address]   Agree with above.   POD#1 HM-3 LVAD  Remains intubated. Awake following commands. On NO 5 ppm, Epi 2 mcg, milrinone 0.5 mcg , and norepi 5 mcg. Co-ox 69% but PAPI < 1  CTs dry. Hg 12.2   General:  Awake on vent HEENT: normal + ETT Neck: + swan Cor: Sternal dressing and CTs ok  LVAD hum.  Lungs: Clear. Abdomen: soft, nontender, non-distended. No hepatosplenomegaly. No bruits or masses. Hypoactive bowel sounds. Driveline site clean. Anchor in place.  Extremities: no cyanosis, clubbing, rash. Warm no edema  Neuro: awake following commands Moves all 4 without problem   Extubate this am. Wean inotropes slowly watching RV carefully. Leave swan in. Can give one dose of IV lasix.   VAD interrogated personally. Parameters stable.  Mobilize.  CRITICAL CARE Performed by: [name]  Total critical care time: 45 minutes  Critical care time was exclusive of separately billable procedures and treating other patients.  Critical care was necessary to treat or prevent imminent or life-threatening deterioration.  Critical care was time spent personally  by me (independent of midlevel providers or residents) on the following activities: development of treatment plan with patient and/or surrogate as well as nursing, discussions with consultants, evaluation of patient's response to treatment, examination of patient, obtaining history from patient or surrogate, ordering and performing treatments and interventions, ordering and review of laboratory studies, ordering and  review of radiographic studies, pulse oximetry and re-evaluation of patient's condition.  [name], MD  7:48 AM

## 2023-07-31 NOTE — Progress Notes (Signed)
HeartMate 3 Rounding Note Postop day #1 Subjective:   37 year old male with nonischemic cardiomyopathy presented with cardiogenic shock from severe LV dysfunction and moderate to severe RV dysfunction.  After full evaluation and counseling he underwent implantation of HeartMate 3 July 30, 2023  Overnight the patient remained hemodynamically stable and the inhaled nitric oxide was weaned.  The patient was extubated a.m. postop day #1 He is neurologically intact but complaining of surgical pain especially at the pump pocket site along the left lateral chest wall.  Chest tubes have serosanguineous output, a.m. chest x-ray shows some interstitial edema.   LVAD INTERROGATION:  HeartMate II LVAD:  Flow 4.1 liters/min, speed 5400, power 3.5, PI 3.7.  Controller intact  Objective:    Vital Signs:   Temp:  [95 F (35 C)-100.6 F (38.1 C)] 95 F (35 C) (08/08 1045) Pulse Rate:  [97-159] 120 (08/08 1045) Resp:  [16-35] 20 (08/08 0800) BP: (80-101)/(58-79) 86/72 (08/08 0600) SpO2:  [92 %-98 %] 94 % (08/08 1045) Arterial Line BP: (73-114)/(50-90) 88/72 (08/08 1045) FiO2 (%):  [40 %-50 %] 40 % (08/08 0907) Weight:  [70 kg] 70 kg (08/08 0500) Last BM Date : 07/29/23 Mean arterial Pressure 70 mm Hg  Intake/Output:   Intake/Output Summary (Last 24 hours) at 07/31/2023 1102 Last data filed at 07/31/2023 1000 Gross per 24 hour  Intake 5113.21 ml  Output 3189 ml  Net 1924.21 ml     Physical Exam: General:  Well appearing. No resp difficulty HEENT: normal Neck: supple. JVP . Carotids 2+ bilat; no bruits. No lymphadenopathy or thryomegaly appreciated. Cor: Mechanical heart sounds with LVAD hum present. Lungs: clear Abdomen: soft, nontender, nondistended. No hepatosplenomegaly. No bruits or masses. Good bowel sounds. Extremities: no cyanosis, clubbing, rash, edema Neuro: alert & orientedx3, cranial nerves grossly intact. moves all 4 extremities w/o difficulty.  Okay pleasant  Telemetry:  Sinus tach  Labs: Basic Metabolic Panel: Recent Labs  Lab 07/29/23 0325 07/30/23 0519 07/30/23 0540 07/30/23 1151 07/30/23 1221 07/30/23 1301 07/30/23 1307 07/30/23 1504 07/30/23 1521 07/30/23 2000 07/31/23 0428 07/31/23 0436  NA 134* 136   < > 140   < > 140   < > 139 141 135  140 136 139  K 3.5 3.6   < > 3.8   < > 3.4*   < > 3.5 3.5 4.1  4.3 4.5 4.5  CL 103 96*   < > 100  --  102  --  104  --  102 107  --   CO2 22 25  --   --   --   --   --  25  --  24 22  --   GLUCOSE 194* 165*   < > 144*  --  156*  --  62*  --  116* 91  --   BUN 21* 29*   < > 25*  --  25*  --  21*  --  19 16  --   CREATININE 1.22 1.35*   < > 1.20  --  1.30*  --  1.28*  --  1.22 1.09  --   CALCIUM 8.2* 8.8*  --   --   --   --   --  6.9*  --  7.0* 7.2*  --   MG 2.1 1.8  --   --   --   --   --  2.1  --  3.1* 2.5*  --   PHOS  --   --   --   --   --   --   --   --   --   --  3.1  --    < > = values in this interval not displayed.    Liver Function Tests: Recent Labs  Lab 07/30/23 0519 07/31/23 0428  AST 34 129*  ALT 47* 35  ALKPHOS 85 53  BILITOT 0.5 2.0*  PROT 6.6 5.8*  ALBUMIN 3.6 3.5   No results for input(s): "LIPASE", "AMYLASE" in the last 168 hours. No results for input(s): "AMMONIA" in the last 168 hours.  CBC: Recent Labs  Lab 07/26/23 0428 07/27/23 0404 07/28/23 0320 07/28/23 0902 07/29/23 0325 07/30/23 0519 07/30/23 0540 07/30/23 1126 07/30/23 1151 07/30/23 1307 07/30/23 1504 07/30/23 1521 07/30/23 2000 07/31/23 0436  WBC 6.8 6.3 6.1  --  5.9 6.5  --   --   --   --  12.0*  --  9.0  --   NEUTROABS 3.5 2.8 2.6  --  2.7 2.9  --   --   --   --   --   --   --   --   HGB 15.3 14.4 14.4   < > 13.9 16.4   < > 11.8*   < > 12.2* 12.2* 11.9* 11.8*  11.9* 12.2*  HCT 46.4 44.8 44.1   < > 42.5 49.6   < > 35.4*   < > 36.0* 37.2* 35.0* 36.0*  35.0* 36.0*  MCV 90.6 89.1 89.1  --  88.9 88.4  --   --   --   --  88.8  --  89.1  --   PLT 186 200 199  --  207 257  --  234  --   --  176  176   --  168  --    < > = values in this interval not displayed.    INR: Recent Labs  Lab 07/30/23 0624 07/30/23 1504 07/31/23 0428  INR 1.1 1.3*  DUPLICATE 1.4*    Other results: EKG:   Imaging: A.m. chest x-ray personally reviewed.   Medications:     Scheduled Medications:  acetaminophen  1,000 mg Oral Q6H   aspirin EC  325 mg Oral Daily   bisacodyl  10 mg Oral Daily   [START ON 08/01/2023] buprenorphine-naloxone  1 tablet Sublingual Daily   Chlorhexidine Gluconate Cloth  6 each Topical Daily   digoxin  0.125 mg Oral Daily   docusate sodium  200 mg Oral Daily   fentaNYL (SUBLIMAZE) injection  50 mcg Intravenous Once   furosemide  20 mg Intravenous BID   gabapentin  300 mg Oral BID   insulin aspart  0-24 Units Subcutaneous Q4H   levalbuterol  1.25 mg Nebulization Q6H   lidocaine  2 patch Transdermal Q24H   loratadine  10 mg Oral QPM   melatonin  3 mg Oral QHS   metoCLOPramide (REGLAN) injection  10 mg Intravenous Q6H   mexiletine  200 mg Oral BID   mometasone-formoterol  2 puff Inhalation BID   montelukast  10 mg Oral q morning   mouth rinse  15 mL Mouth Rinse Q2H   [START ON 08/01/2023] pantoprazole  40 mg Oral Daily   polyethylene glycol  17 g Oral Daily   sodium chloride flush  10-40 mL Intracatheter Q12H   sodium chloride flush  3 mL Intravenous Q12H   warfarin  2.5 mg Oral ONCE-1600   Warfarin - Physician Dosing Inpatient   Does not apply q1600    Infusions:  sodium chloride Stopped (07/31/23 0525)   sodium chloride 1 mL/hr at 07/31/23 1000  sodium chloride 10 mL/hr at 07/31/23 1000   albumin human Stopped (07/31/23 0155)    ceFAZolin (ANCEF) IV Stopped (07/31/23 0603)   dexmedetomidine (PRECEDEX) IV infusion     epinephrine 2 mcg/min (07/31/23 1000)   insulin Stopped (07/30/23 1959)   lactated ringers     lactated ringers     lactated ringers 10 mL/hr at 07/31/23 1000   milrinone 0.5 mcg/kg/min (07/31/23 1000)   norepinephrine (LEVOPHED) Adult infusion  5 mcg/min (07/31/23 1000)   vancomycin 1,000 mg (07/31/23 1005)    PRN Medications: sodium chloride, acetaminophen, albumin human, dextrose, fentaNYL, fluticasone, HYDROcodone-acetaminophen, lactated ringers, LORazepam, midazolam, morphine injection, nitroGLYCERIN, ondansetron (ZOFRAN) IV, oxyCODONE, traMADol   Assessment:  Status post HeartMate 3 implantation July 30, 2023 for nonischemic cardiomyopathy with biventricular dysfunction  Past medical history positive for polycythemia, asthma, and chronic Suboxone treatment for opioid abuse    Plan/Discussion:    Patient was extubated today and will maintain low-dose inhaled nitric oxide via nasal cannula 5 ppm. Patient will start IV Lasix diuresis and pain management will be the immediate issues.  Maintain epinephrine and titrate norepinephrine to keep mean arterial pressure greater than 70 mmHg  Chest tubes will remain in place and low-dose Coumadin will be started since he passed a swallow test.  I reviewed the LVAD parameters from today, and compared the results to the patient's prior recorded data.  No programming changes were made.  The LVAD is functioning within specified parameters.  The patient performs LVAD self-test daily.  LVAD interrogation was negative for any significant power changes, alarms or PI events/speed drops.  LVAD equipment check completed and is in good working order.  Back-up equipment present.   LVAD education done on emergency procedures and precautions and reviewed exit site care.  Length of Stay: [address]  Lovett Sox 07/31/2023, 11:02 AM

## 2023-07-31 NOTE — Progress Notes (Addendum)
Hypoglycemic Event   CBG: 65 (Time: 1958)   Treatment: 8 oz of orange juice   Symptoms: None   Follow-up CBG  Time: 2021 Result: 77  Treatment: 8 oz of orange juice  Symptoms: None  Follow-up CBG  Time: 2102 Result: 180   Possible Reasons for Event: Limited PO intake, medications

## 2023-07-31 NOTE — Progress Notes (Signed)
Nitric wean started.  RT will continue to wean until 5 ppm.

## 2023-07-31 NOTE — Procedures (Signed)
Extubation Procedure Note  Patient Details:   Name: Joel York DOB: 1986-05-13 MRN: [id number]   Airway Documentation:    Vent end date: 07/31/23 Vent end time: 0925   Evaluation  O2 sats: stable throughout Complications: No apparent complications Patient did tolerate procedure well. Bilateral Breath Sounds: Clear, Diminished    Patient extubated to 3L [place] with 5 of nitric and tolerating well. Pt had a positive cuff leak, VC 1.6L, NIF -22. Post extubation pt was able to vocalized his name. RN at bedside.         Yes  Bess Harvest 07/31/2023, 9:34 AM

## 2023-07-31 NOTE — Progress Notes (Signed)
LVAD Coordinator Rounding Note:  Admitted 07/14/23 from ED due to worsening HF symptoms.   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  Pt is sitting up in bed, intubated. Awake and following commands. Kendall at bedside.   Vital signs: Temp: 98.6 HR: 104 Doppler Pressure:80 Art BP: 84/74 (79) O2 Sat: 97 on 50 % FiO2 Wt:150.3>154.3     LVAD interrogation reveals:   Speed: 5400 Flow: 4.3 Power:  3.9 PI:3  Alarms: none Events:  none Hematocrit: 36  Fixed speed: 5400 Low speed limit: 5100  Drive Line: Existing VAD dressing removed and site care performed using sterile technique. Drive line exit site cleaned with Chlora prep applicators x 2, allowed to dry, and silver strip with gauze applied. Exit site healing and unincorporated, the velour is fully implanted at exit site. Moderate amount of sanguineous drainage. No redness, tenderness, foul odor or rash noted. Drive line anchor re-applied. Next dressing change due tomorrow 08/01/23 by VAD coordinator or nurse champion only.    Labs:  LDH trend:427  INR trend: 1.4  Anticoagulation Plan: -INR Goal: 2-2.5 -ASA Dose: 325  Blood Products:  Intra op: 2 FFP 450 Cell Saver  Arrythmias:   Respiratory:on vent at 50 % rapid weaning  Infection:   Renal:  -BUN/CRT: 16/1.09  Gtts: Epi 2 mcg/min Milrinone 0.5 mcg/kg/min Levophed 5 mcg/min Fentanyl off  Patient Education: Pt intubated. Kendall at bedside. She observed dressing change this morning.  Adverse Events on VAD: -  Plan/Recommendations:  1. Continue daily dressing changes by VAD coordinator or Nurse Alla Feeling 2. Please page VAD coordinator for any alarms or VAD equipment issues.  Carlton Adam RN, BSN VAD Coordinator 24/7 Pager [phone]

## 2023-07-31 NOTE — Progress Notes (Signed)
CSW met at bedside with patient's wife. Patient is extubated and resting comfortably. Wife plans to stay with patient and denies any concerns at this time. CSW provided support and will continue to follow for supportive needs throughout implant hospitalization. Lasandra Beech, LCSW, CCSW-MCS [phone]

## 2023-07-31 NOTE — Progress Notes (Signed)
PT Cancellation Note  Patient Details Name: Joel York MRN: [id number] DOB: 10/18/1986   Cancelled Treatment:    Reason Eval/Treat Not Completed: Other (comment). Order to begin POD #2. Will see tomorrow.   [name] [facility] 07/31/2023, 9:15 AM Skip Mayer PT Acute [employer] [phone]

## 2023-08-01 ENCOUNTER — Inpatient Hospital Stay (HOSPITAL_COMMUNITY): Payer: Medicaid Other

## 2023-08-01 DIAGNOSIS — J9811 Atelectasis: Secondary | ICD-10-CM | POA: Diagnosis not present

## 2023-08-01 DIAGNOSIS — R570 Cardiogenic shock: Secondary | ICD-10-CM | POA: Diagnosis not present

## 2023-08-01 DIAGNOSIS — Z95811 Presence of heart assist device: Secondary | ICD-10-CM | POA: Diagnosis not present

## 2023-08-01 DIAGNOSIS — I517 Cardiomegaly: Secondary | ICD-10-CM | POA: Diagnosis not present

## 2023-08-01 DIAGNOSIS — R0989 Other specified symptoms and signs involving the circulatory and respiratory systems: Secondary | ICD-10-CM | POA: Diagnosis not present

## 2023-08-01 DIAGNOSIS — I428 Other cardiomyopathies: Secondary | ICD-10-CM | POA: Diagnosis not present

## 2023-08-01 DIAGNOSIS — Z452 Encounter for adjustment and management of vascular access device: Secondary | ICD-10-CM | POA: Diagnosis not present

## 2023-08-01 DIAGNOSIS — I5082 Biventricular heart failure: Secondary | ICD-10-CM | POA: Diagnosis not present

## 2023-08-01 DIAGNOSIS — R918 Other nonspecific abnormal finding of lung field: Secondary | ICD-10-CM | POA: Diagnosis not present

## 2023-08-01 DIAGNOSIS — J90 Pleural effusion, not elsewhere classified: Secondary | ICD-10-CM | POA: Diagnosis not present

## 2023-08-01 DIAGNOSIS — I5021 Acute systolic (congestive) heart failure: Secondary | ICD-10-CM | POA: Diagnosis not present

## 2023-08-01 LAB — POCT I-STAT 7, (LYTES, BLD GAS, ICA,H+H)
Acid-Base Excess: 3 mmol/L — ABNORMAL HIGH (ref 0.0–2.0)
Bicarbonate: 28.2 mmol/L — ABNORMAL HIGH (ref 20.0–28.0)
Calcium, Ion: 1.12 mmol/L — ABNORMAL LOW (ref 1.15–1.40)
HCT: 39 % (ref 39.0–52.0)
Hemoglobin: 13.3 g/dL (ref 13.0–17.0)
O2 Saturation: 96 %
Patient temperature: 99.5
Potassium: 4.1 mmol/L (ref 3.5–5.1)
Sodium: 133 mmol/L — ABNORMAL LOW (ref 135–145)
TCO2: 30 mmol/L (ref 22–32)
pCO2 arterial: 44.3 mmHg (ref 32–48)
pH, Arterial: 7.415 (ref 7.35–7.45)
pO2, Arterial: 83 mmHg (ref 83–108)

## 2023-08-01 LAB — GLUCOSE, CAPILLARY
Glucose-Capillary: 118 mg/dL — ABNORMAL HIGH (ref 70–99)
Glucose-Capillary: 127 mg/dL — ABNORMAL HIGH (ref 70–99)
Glucose-Capillary: 181 mg/dL — ABNORMAL HIGH (ref 70–99)
Glucose-Capillary: 138 mg/dL — ABNORMAL HIGH (ref 70–99)
Glucose-Capillary: 127 mg/dL — ABNORMAL HIGH (ref 70–99)
Glucose-Capillary: 120 mg/dL — ABNORMAL HIGH (ref 70–99)

## 2023-08-01 MED ORDER — ENSURE ENLIVE PO LIQD
237.0000 mL | Freq: Three times a day (TID) | ORAL | Status: DC
  Administered 2023-08-01 – 2023-08-05 (×10): 237 mL via ORAL

## 2023-08-01 MED ORDER — POTASSIUM PHOSPHATES 15 MMOLE/5ML IV SOLN
30.0000 mmol | Freq: Once | INTRAVENOUS | Status: AC
  Administered 2023-08-01: 30 mmol via INTRAVENOUS
  Filled 2023-08-01: qty 10

## 2023-08-01 MED ORDER — WARFARIN SODIUM 3 MG PO TABS
3.0000 mg | ORAL_TABLET | Freq: Once | ORAL | Status: AC
  Administered 2023-08-01: 3 mg via ORAL
  Filled 2023-08-01: qty 1

## 2023-08-01 MED ORDER — FUROSEMIDE 10 MG/ML IJ SOLN
20.0000 mg | Freq: Once | INTRAMUSCULAR | Status: AC
  Administered 2023-08-01: 20 mg via INTRAVENOUS
  Filled 2023-08-01: qty 2

## 2023-08-01 MED FILL — Sodium Bicarbonate IV Soln 8.4%: INTRAVENOUS | Qty: 50 | Status: AC

## 2023-08-01 MED FILL — Heparin Sodium (Porcine) Inj 1000 Unit/ML: INTRAMUSCULAR | Qty: 20 | Status: AC

## 2023-08-01 MED FILL — Electrolyte-R (PH 7.4) Solution: INTRAVENOUS | Qty: 5000 | Status: AC

## 2023-08-01 MED FILL — Sodium Chloride IV Soln 0.9%: INTRAVENOUS | Qty: 2000 | Status: AC

## 2023-08-01 NOTE — Progress Notes (Signed)
     [address]       [place] [address]             [phone]       EVENING ROUNDS  Comfortable in chair LVAD with good hemodynamics On milrinone, levo and epi Chest tubes still in Continue present care

## 2023-08-01 NOTE — Progress Notes (Signed)
LVAD Coordinator Rounding Note:  Admitted 07/14/23 from ED due to worsening HF symptoms.   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  Pt lying in bed. Moaning in pain, nods head yes or no to my questions. Nurse is planning to get pt in the chair today.   DR Bensimhon increased speed to 5500 today.  Vital signs: Temp: 98.6 HR: 118 Doppler Pressure:80 Art BP: 130/92 (105) O2 Sat: 98 on 5L [place] Wt:150.3>154.3>161.3     LVAD interrogation reveals:   Speed: 5500 Flow: 3.8 Power:  4.2 PI: 4.3  Alarms: none Events:  none Hematocrit: 36  Fixed speed: 5500 Low speed limit: 5200  Drive Line: Existing VAD dressing removed and site care performed using sterile technique. Drive line exit site cleaned with Chlora prep applicators x 2, allowed to dry, and silver strip with gauze applied. Exit site healing and unincorporated, the velour is fully implanted at exit site. Large amount of sanguineous drainage. No redness, tenderness, foul odor or rash noted. Drive line anchor re-applied. Next dressing change due tomorrow 08/02/23 by VAD coordinator or nurse champion only.     Labs:  LDH trend:427>487  INR trend: 1.4>1.5  Anticoagulation Plan: -INR Goal: 2-2.5 -ASA Dose: 325  Blood Products:  Intra op: 2 FFP 450 Cell Saver  Arrythmias:   Respiratory:on 5L/[place]  Infection:   Renal:  -BUN/CRT: 16/1.09>10/1.07  Gtts: Epi 2 mcg/min Milrinone 0.5 mcg/kg/min Levophed 5 mcg/min-OFF Fentanyl off  Patient Education: No family at bedside today. Pt in a lot of pain.  Adverse Events on VAD: -  Plan/Recommendations:  1. Continue daily dressing changes by VAD coordinator or Nurse Alla Feeling 2. Please page VAD coordinator for any alarms or VAD equipment issues.  Carlton Adam RN, BSN VAD Coordinator 24/7 Pager [phone]

## 2023-08-01 NOTE — Progress Notes (Addendum)
Advanced Heart Failure VAD Team Note  PCP-Cardiologist: None   Subjective:   8/7 S/P HMIII LVAD 8/8 Extubated. Diuresed with 20 mg IV lasix x1 .  Negative 1 liter.   Currently on Milrinone 0.5 mcg + Norepi 3 mcg+ Epi 2 mcg.    iNO 5 PPM.   CVP 15 PA 36/16 (26) CO 6 CI 3.4 CO-OX 72%   LDH 427 >487 INR 1.5   Complaining of pain. Able to stand.   LVAD INTERROGATION:  HeartMate III LVAD:   Flow 3.9  liters/min, speed 5400, power 4, PI 4 No PI events overnight.   Objective:    Vital Signs:   Temp:  [93.4 F (34.1 C)-99.3 F (37.4 C)] 98.6 F (37 C) (08/09 0700) Pulse Rate:  [99-177] 112 (08/09 0700) Resp:  [16-24] 20 (08/09 0700) BP: (116-120)/(84-90) 120/90 (08/09 0000) SpO2:  [92 %-99 %] 97 % (08/09 0700) Arterial Line BP: (83-252)/(64-248) 122/88 (08/09 0700) FiO2 (%):  [40 %-50 %] 40 % (08/08 0907) Weight:  [73.2 kg] 73.2 kg (08/09 0500) Last BM Date : 07/29/23 Mean arterial Pressure 80s   Intake/Output:   Intake/Output Summary (Last 24 hours) at 08/01/2023 0707 Last data filed at 08/01/2023 0700 Gross per 24 hour  Intake 2619.05 ml  Output 3635 ml  Net -1015.95 ml    CVP 14-15  Physical Exam    Physical Exam: GENERAL: No acute distress. HEENT: normal  NECK: Supple, JVP  to jaw .  2+ bilaterally, no bruits.  No lymphadenopathy or thyromegaly appreciated.  LIJ swan RIJ  CARDIAC:  Mechanical heart sounds with LVAD hum present. Sternal dressing intact  LUNGS:  Clear to auscultation bilaterally. CT x2 MTx1  ABDOMEN:  Soft, round, nontender, positive bowel sounds x4.     LVAD exit site:  Dressing dry and intact.  No erythema or drainage.  Stabilization device present  EXTREMITIES:  Warm and dry, no cyanosis, clubbing, rash or edema  NEUROLOGIC:  Alert and oriented x 3.    No aphasia.  No dysarthria.  Affect flat     Telemetry   ST 110s  EKG    N/A  Labs   Basic Metabolic Panel: Recent Labs  Lab 07/30/23 1504 07/30/23 1521 07/30/23 2000  07/31/23 0428 07/31/23 0436 07/31/23 0916 07/31/23 1040 07/31/23 1652 07/31/23 1704 08/01/23 0426  NA 139   < > 135  140 136   < > 139 139 136 137 133*  K 3.5   < > 4.1  4.3 4.5   < > 4.9 4.8 3.9 4.0 3.9  CL 104  --  102 107  --   --   --  102  --  96*  CO2 25  --  24 22  --   --   --  25  --  25  GLUCOSE 62*  --  116* 91  --   --   --  148*  --  121*  BUN 21*  --  19 16  --   --   --  16  --  10  CREATININE 1.28*  --  1.22 1.09  --   --   --  1.16  --  1.07  CALCIUM 6.9*  --  7.0* 7.2*  --   --   --  7.4*  --  8.2*  MG 2.1  --  3.1* 2.5*  --   --   --  2.2  --  2.1  PHOS  --   --   --  3.1  --   --   --   --   --  1.8*   < > = values in this interval not displayed.    Liver Function Tests: Recent Labs  Lab 07/30/23 0519 07/31/23 0428 08/01/23 0426  AST 34 129* 121*  ALT 47* 35 34  ALKPHOS 85 53 65  BILITOT 0.5 2.0* 1.8*  PROT 6.6 5.8* 5.9*  ALBUMIN 3.6 3.5 3.1*   No results for input(s): "LIPASE", "AMYLASE" in the last 168 hours. No results for input(s): "AMMONIA" in the last 168 hours.  CBC: Recent Labs  Lab 07/27/23 0404 07/28/23 0320 07/28/23 0902 07/29/23 0325 07/30/23 0519 07/30/23 0540 07/30/23 1126 07/30/23 1151 07/30/23 1504 07/30/23 1521 07/30/23 2000 07/31/23 0436 07/31/23 0916 07/31/23 1040 07/31/23 1652 07/31/23 1704 08/01/23 0426  WBC 6.3 6.1  --  5.9 6.5  --   --   --  12.0*  --  9.0  --   --   --  10.0  --  11.5*  NEUTROABS 2.8 2.6  --  2.7 2.9  --   --   --   --   --   --   --   --   --   --   --  9.4*  HGB 14.4 14.4   < > 13.9 16.4   < > 11.8*   < > 12.2*   < > 11.8*  11.9*   < > 12.2* 11.9* 12.0* 12.6* 11.9*  HCT 44.8 44.1   < > 42.5 49.6   < > 35.4*   < > 37.2*   < > 36.0*  35.0*   < > 36.0* 35.0* 37.3* 37.0* 36.6*  MCV 89.1 89.1  --  88.9 88.4  --   --   --  88.8  --  89.1  --   --   --  93.0  --  90.6  PLT 200 199  --  207 257  --  234  --  176  176  --  168  --   --   --  169  --  170   < > = values in this interval not  displayed.    INR: Recent Labs  Lab 07/30/23 0624 07/30/23 1504 07/31/23 0428 08/01/23 0426  INR 1.1 1.3*  DUPLICATE 1.4* 1.5*    Other results: EKG:    Imaging   DG Chest 1 View  Result Date: 07/31/2023 CLINICAL DATA:  [id number] Encounter for central line placement [id number] EXAM: CHEST  1 VIEW COMPARISON:  Earlier the same day at 6 a.m. FINDINGS: Low lung volume. Redemonstration of predominantly central pulmonary vascular congestion. Persistent left retrocardiac opacity. Bilateral lungs are otherwise clear. No pneumothorax. Left lateral costophrenic angle is obscured by overlying support apparatus. Right lateral costophrenic angle is grossly clear. No pneumothorax. Stable cardio-mediastinal silhouette. No acute osseous abnormalities. The soft tissues are within normal limits. Tube/lines: *Right IJ Swan-Ganz sheath with its tip overlying the upper portion of superior vena cava-unchanged. *Right IJ Swan-Ganz catheter with its tip overlying the midportion of superior vena cava-unchanged. *Left IJ Swan-Ganz catheter with its sheath overlying the proximal left brachiocephalic vein and the catheter tip overlying the right main pulmonary trunk-unchanged. *Stable positioning of the left-sided pleural drainage catheter. *Stable LVAD. *Interval removal of previously seen enteric and endotracheal tubes. IMPRESSION: Interval removal of enteric and endotracheal tubes. Otherwise no significant interval change. Electronically Signed   By: Jules Schick M.D.   On: 07/31/2023 14:26   DG  Chest Port 1 View  Result Date: 07/31/2023 CLINICAL DATA:  37 year old male with ischemic cardiomyopathy status post LVAD postoperative day 1. EXAM: PORTABLE CHEST 1 VIEW COMPARISON:  Portable chest 07/30/2023 and earlier. FINDINGS: Portable AP semi upright view at 0604 hours. Intubated. Endotracheal tube tip is at the clavicles. Enteric tube courses to the left upper quadrant with side hole visible well below the diaphragm.  Increased left upper quadrant bowel gas noted. Stable left IJ approach Swan-Ganz catheter, tip at the right main pulmonary artery. Right IJ central line also stable. Superimposed LVAD partially visible, grossly stable. Stable left chest tube. Lower lung volumes with increased retrocardiac opacification and mild air bronchograms. No convincing pneumothorax. Pulmonary vascularity appears decreased. No overt edema. No pleural effusion is evident. Stable visualized osseous structures. Sternotomy. Cholecystectomy clips. IMPRESSION: 1. Stable lines and tubes. No pneumothorax identified. 2. Progressed pulmonary vascular congestion but increased lower lobe collapse or consolidation since yesterday. 3. Increased left upper quadrant bowel gas, significance unclear. Electronically Signed   By: Odessa Fleming M.D.   On: 07/31/2023 08:59   DG Abd 1 View  Result Date: 07/30/2023 CLINICAL DATA:  Feeding tube placement EXAM: ABDOMEN - 1 VIEW COMPARISON:  Chest radiograph dated 07/30/2023 at 1410 hours FINDINGS: Enteric tube terminates in the mid gastric body. LVAD. Left chest tube. Right IJ Swan-Ganz catheter. Right IJ venous catheter, incompletely visualized. These are all unchanged from recent chest radiograph. IMPRESSION: Enteric tube terminates in the mid gastric body. Electronically Signed   By: Charline Bills M.D.   On: 07/30/2023 19:23   DG CHEST PORT 1 VIEW  Result Date: 07/30/2023 CLINICAL DATA:  LVAD insertion.  Elective surgery. EXAM: PORTABLE CHEST 1 VIEW COMPARISON:  Radiograph yesterday FINDINGS: Interval median sternotomy. Endotracheal tube tip 3.9 cm from the carina. Swan-Ganz catheter from left internal jugular approach with tip in the region of the main pulmonary outflow tract. Right internal jugular central venous catheter tip overlies the SVC. LVAD demonstrated. Left-sided chest tube in place. Cardiomegaly is similar or slightly improved. Improvement in pulmonary edema. No pneumothorax. No large pleural  effusion IMPRESSION: 1. Interval median sternotomy. 2. Support apparatus as described. 3. Improvement in pulmonary edema. Electronically Signed   By: Narda Rutherford M.D.   On: 07/30/2023 15:05   EP STUDY  Result Date: 07/30/2023 See surgical note for result.    Medications:     Scheduled Medications:  acetaminophen  1,000 mg Oral Q6H   aspirin EC  325 mg Oral Daily   bisacodyl  10 mg Oral Daily   buprenorphine-naloxone  1 tablet Sublingual Daily   Chlorhexidine Gluconate Cloth  6 each Topical Daily   digoxin  0.125 mg Oral Daily   docusate sodium  200 mg Oral Daily   furosemide  20 mg Intravenous BID   gabapentin  300 mg Oral BID   levalbuterol  1.25 mg Nebulization Q6H   lidocaine  2 patch Transdermal Q24H   loratadine  10 mg Oral QPM   melatonin  3 mg Oral QHS   metoCLOPramide (REGLAN) injection  10 mg Intravenous Q6H   mexiletine  200 mg Oral BID   mometasone-formoterol  2 puff Inhalation BID   montelukast  10 mg Oral q morning   mouth rinse  15 mL Mouth Rinse Q2H   pantoprazole  40 mg Oral Daily   polyethylene glycol  17 g Oral Daily   simethicone  80 mg Oral QID   sodium chloride flush  10-40 mL Intracatheter Q12H  sodium chloride flush  3 mL Intravenous Q12H   Warfarin - Physician Dosing Inpatient   Does not apply q1600    Infusions:  sodium chloride Stopped (07/31/23 0525)   sodium chloride 1 mL/hr at 08/01/23 0700   sodium chloride 10 mL/hr at 08/01/23 0700    ceFAZolin (ANCEF) IV Stopped (08/01/23 0544)   epinephrine 2 mcg/min (08/01/23 0700)   lactated ringers     lactated ringers     lactated ringers 10 mL/hr at 08/01/23 0700   milrinone 0.5 mcg/kg/min (08/01/23 0700)   norepinephrine (LEVOPHED) Adult infusion 3 mcg/min (08/01/23 0700)    PRN Medications: sodium chloride, acetaminophen, cyclobenzaprine, fentaNYL (SUBLIMAZE) injection, fluticasone, HYDROcodone-acetaminophen, lactated ringers, LORazepam, midazolam, morphine injection, nitroGLYCERIN,  ondansetron (ZOFRAN) IV, oxyCODONE, traMADol   Patient Profile   Joel York is a 37 y.o. male with HTN, ADD, asthma, hx drug abuse (now on suboxone x5 yrs), and seizures. Admitted with acute systolic heart failure. NYHA IV -> shock.   S/P HMIII LVAD   Assessment/Plan:   1. S/P HMIII LVAD  VAD Parameters stable. Extubated post op day 1. Remains on Epi 2 mcg, milrinone 0.5 mcg , and norepi 2 mcg. Wean Norepi.  Wean iNo per CT surgery.  CO-OX stable. CVP 15 PA 36/16 (26) CO 6 CI 3.4  Follow daily LDH and INR.  INR 1.5 LDH 427>487.  Discussed pain regimen with pharmacy.   2. Acute biventricular systolic heart failure >> cardiogenic shock - Admitted NYHA IV symptoms .Echo EF <20%,  RV mildly reduced, RV mod enlarged, estimated RV systolic pressure 53.39mmHg, LA mod dilated, RA mod dilated, mild-mod MR/TR - Etiology uncertain. ? 2/2 PVCs vs genetically mediated +/- hypertension.  - LHC with normal coronaries. RHC 07/24: Low CO (Fick CO/CI 3.3/1.9) and PAPi 3.0 on milrinone 0.375 . Off NE - cMRI:  LV markedly dilated EF 10% RV 17% NICM - Limited echo 07/29: LVEF < 20%, RV function improved. - Discussed with [facility] not a candidate for transplant with smoking.  - S/P HMIII as above.  - CVP 15. Given another 20 mg IV lasix today.  - Renal function stable.   3. Chest pressure - HsTrop 39>43>37>40, d/t demand ischemia - No CAD on cath   4. PVCs - snores, will need eventual sleep study  - Suppressed with Mexiletine 200 mg BID - TSH 4.9, T4 0.79 - Keep K>4 and Mg >2   5. AKI - resolved.   6. Tobacco use - smokes 3-6 cigarettes / day - cessation discussed   6. Hx drug abuse - Restart suboxone today    OOB today. PT consulted.   I reviewed the LVAD parameters from today, and compared the results to the patient's prior recorded data.  No programming changes were made.  The LVAD is functioning within specified parameters.  The patient performs LVAD self-test daily.  LVAD  interrogation was negative for any significant power changes, alarms or PI events/speed drops.  LVAD equipment check completed and is in good working order.  Back-up equipment present.   LVAD education done on emergency procedures and precautions and reviewed exit site care.  Length of Stay: 108  Tonye Becket, NP 08/01/2023, 7:07 AM  VAD Team --- VAD ISSUES ONLY--- Pager [phone] (7am - 7am)  Advanced Heart Failure Team  Pager [phone] (M-F; 7a - 5p)  Please contact [facility] for night-coverage after hours (5p -7a ) and weekends on [email or web address]   Agree with above.   POD #2 VAD.   Chest  still sore. Remains on Epi 2 mcg, milrinone 0.5 mcg , and norepi 2 mcg. PAPI ~1.5 on swan.   Diuresed well with low-dose lasix yesterday. VAD parameters stable.   General:  Sitting up in bed  HEENT: normal  Neck: supple. LIJ swan .  Carotids 2+ bilat; no bruits. No lymphadenopathy or thryomegaly appreciated. Cor: Sternal dressing and CTs ok LVAD hum.  Lungs: Clear. Abdomen:  soft, nontender, non-distended. No hepatosplenomegaly. No bruits or masses. Good bowel sounds. Driveline site clean. Anchor in place.  Extremities: no cyanosis, clubbing, rash. Warm trace edema  Neuro: alert & oriented x 3. No focal deficits. Moves all 4 without problem   Wean NE slowly. Diurese gently. I turned VAD speed up to 5500. Mobilize. Leave swan in one more day. Pain control (cautiously)  CRITICAL CARE Performed by: [name]  Total critical care time: 45 minutes  Critical care time was exclusive of separately billable procedures and treating other patients.  Critical care was necessary to treat or prevent imminent or life-threatening deterioration.  Critical care was time spent personally by me (independent of midlevel providers or residents) on the following activities: development of treatment plan with patient and/or surrogate as well as nursing, discussions with consultants, evaluation of patient's  response to treatment, examination of patient, obtaining history from patient or surrogate, ordering and performing treatments and interventions, ordering and review of laboratory studies, ordering and review of radiographic studies, pulse oximetry and re-evaluation of patient's condition.  [name], MD  4:48 PM

## 2023-08-01 NOTE — Progress Notes (Signed)
Patient weaned off the nitric with no complications noted. Patient is on 3L [place], vitals are stable.

## 2023-08-01 NOTE — Progress Notes (Signed)
HeartMate 3 Rounding Note Postop day #2 Subjective:   37 year old male with nonischemic cardiomyopathy presented with cardiogenic shock from severe LV dysfunction and moderate to severe RV dysfunction.  After full evaluation and counseling he underwent implantation of HeartMate 3 July 30, 2023  Patient had a stable night with improved MAP and norepinephrine is being weaned off.  Chest x-ray shows increased fluid and his weight is up 2 pounds and he will receive IV Lasix today. Continue low-dose epinephrine for RV function as well as milrinone.  VAD parameters are satisfactory, cardiac index greater than 3.0 and coox 70%.  Hopefully PA catheter can be removed today and patient mobilized out of bed to chair with physical therapy.  Chest tubes past 24 hours drained greater than 500 cc so we will leave in place.  INR 1.5, Coumadin 3 mg has been ordered. Patient has epicardial pacing wires which will be removed next week.  Patient denies nausea, no flatus, x-ray shows ileus and the patient is on IV Reglan every 6 hours.  Continue clear liquid diet only.   LVAD INTERROGATION:  HeartMate II LVAD:  Flow 3.8 liters/min, speed 5400, power 3.5, PI 3.7.  Controller intact  Objective:    Vital Signs:   Temp:  [93.4 F (34.1 C)-99.3 F (37.4 C)] 98.6 F (37 C) (08/09 0700) Pulse Rate:  [99-177] 112 (08/09 0700) Resp:  [16-24] 20 (08/09 0700) BP: (116-120)/(84-90) 120/90 (08/09 0000) SpO2:  [92 %-99 %] 97 % (08/09 0700) Arterial Line BP: (85-252)/(64-248) 122/88 (08/09 0700) FiO2 (%):  [40 %] 40 % (08/08 0907) Weight:  [73.2 kg] 73.2 kg (08/09 0500) Last BM Date : 07/29/23 Mean arterial Pressure 70 mm Hg  Intake/Output:   Intake/Output Summary (Last 24 hours) at 08/01/2023 0850 Last data filed at 08/01/2023 0700 Gross per 24 hour  Intake 2480.87 ml  Output 3470 ml  Net -989.13 ml     Physical Exam: General:  Well appearing. No resp difficulty.  Complains of left chest pocket  pain. HEENT: normal Neck: supple. JVP . Carotids 2+ bilat; no bruits. No lymphadenopathy or thryomegaly appreciated. Cor: Mechanical heart sounds with LVAD hum present. Lungs: clear Abdomen: soft, nontender, nondistended. No hepatosplenomegaly. No bruits or masses.  Diminished bowel sounds. Extremities: no cyanosis, clubbing, rash, edema Neuro: alert & orientedx3, cranial nerves grossly intact. moves all 4 extremities w/o difficulty.  Okay pleasant  Telemetry: Sinus tach  Labs: Basic Metabolic Panel: Recent Labs  Lab 07/30/23 1504 07/30/23 1521 07/30/23 2000 07/31/23 0428 07/31/23 0436 07/31/23 0916 07/31/23 1040 07/31/23 1652 07/31/23 1704 08/01/23 0426  NA 139   < > 135  140 136   < > 139 139 136 137 133*  K 3.5   < > 4.1  4.3 4.5   < > 4.9 4.8 3.9 4.0 3.9  CL 104  --  102 107  --   --   --  102  --  96*  CO2 25  --  24 22  --   --   --  25  --  25  GLUCOSE 62*  --  116* 91  --   --   --  148*  --  121*  BUN 21*  --  19 16  --   --   --  16  --  10  CREATININE 1.28*  --  1.22 1.09  --   --   --  1.16  --  1.07  CALCIUM 6.9*  --  7.0* 7.2*  --   --   --  7.4*  --  8.2*  MG 2.1  --  3.1* 2.5*  --   --   --  2.2  --  2.1  PHOS  --   --   --  3.1  --   --   --   --   --  1.8*   < > = values in this interval not displayed.    Liver Function Tests: Recent Labs  Lab 07/30/23 0519 07/31/23 0428 08/01/23 0426  AST 34 129* 121*  ALT 47* 35 34  ALKPHOS 85 53 65  BILITOT 0.5 2.0* 1.8*  PROT 6.6 5.8* 5.9*  ALBUMIN 3.6 3.5 3.1*   No results for input(s): "LIPASE", "AMYLASE" in the last 168 hours. No results for input(s): "AMMONIA" in the last 168 hours.  CBC: Recent Labs  Lab 07/27/23 0404 07/28/23 0320 07/28/23 0902 07/29/23 0325 07/30/23 0519 07/30/23 0540 07/30/23 1126 07/30/23 1151 07/30/23 1504 07/30/23 1521 07/30/23 2000 07/31/23 0436 07/31/23 0916 07/31/23 1040 07/31/23 1652 07/31/23 1704 08/01/23 0426  WBC 6.3 6.1  --  5.9 6.5  --   --   --   12.0*  --  9.0  --   --   --  10.0  --  11.5*  NEUTROABS 2.8 2.6  --  2.7 2.9  --   --   --   --   --   --   --   --   --   --   --  9.4*  HGB 14.4 14.4   < > 13.9 16.4   < > 11.8*   < > 12.2*   < > 11.8*  11.9*   < > 12.2* 11.9* 12.0* 12.6* 11.9*  HCT 44.8 44.1   < > 42.5 49.6   < > 35.4*   < > 37.2*   < > 36.0*  35.0*   < > 36.0* 35.0* 37.3* 37.0* 36.6*  MCV 89.1 89.1  --  88.9 88.4  --   --   --  88.8  --  89.1  --   --   --  93.0  --  90.6  PLT 200 199  --  207 257  --  234  --  176  176  --  168  --   --   --  169  --  170   < > = values in this interval not displayed.    INR: Recent Labs  Lab 07/30/23 0624 07/30/23 1504 07/31/23 0428 08/01/23 0426  INR 1.1 1.3*  DUPLICATE 1.4* 1.5*    Other results: EKG:   Imaging: A.m. chest x-ray personally reviewed.   Medications:     Scheduled Medications:  acetaminophen  1,000 mg Oral Q6H   aspirin EC  325 mg Oral Daily   bisacodyl  10 mg Oral Daily   buprenorphine-naloxone  1 tablet Sublingual Daily   Chlorhexidine Gluconate Cloth  6 each Topical Daily   digoxin  0.125 mg Oral Daily   docusate sodium  200 mg Oral Daily   feeding supplement  237 mL Oral TID WC   gabapentin  300 mg Oral BID   levalbuterol  1.25 mg Nebulization Q6H   lidocaine  2 patch Transdermal Q24H   loratadine  10 mg Oral QPM   melatonin  3 mg Oral QHS   metoCLOPramide (REGLAN) injection  10 mg Intravenous Q6H   mexiletine  200 mg Oral BID   mometasone-formoterol  2 puff Inhalation BID   montelukast  10  mg Oral q morning   mouth rinse  15 mL Mouth Rinse Q2H   pantoprazole  40 mg Oral Daily   polyethylene glycol  17 g Oral Daily   simethicone  80 mg Oral QID   sodium chloride flush  10-40 mL Intracatheter Q12H   sodium chloride flush  3 mL Intravenous Q12H   warfarin  3 mg Oral ONCE-1600   Warfarin - Physician Dosing Inpatient   Does not apply q1600    Infusions:  sodium chloride Stopped (07/31/23 0525)   sodium chloride 1 mL/hr at 08/01/23  0700   sodium chloride 10 mL/hr at 08/01/23 0700    ceFAZolin (ANCEF) IV Stopped (08/01/23 0544)   epinephrine 2 mcg/min (08/01/23 0700)   lactated ringers     lactated ringers     lactated ringers 10 mL/hr at 08/01/23 0700   milrinone 0.5 mcg/kg/min (08/01/23 0700)   norepinephrine (LEVOPHED) Adult infusion 3 mcg/min (08/01/23 0700)   potassium PHOSPHATE IVPB (in mmol)      PRN Medications: sodium chloride, acetaminophen, cyclobenzaprine, fentaNYL (SUBLIMAZE) injection, fluticasone, HYDROcodone-acetaminophen, lactated ringers, LORazepam, midazolam, morphine injection, nitroGLYCERIN, ondansetron (ZOFRAN) IV, oxyCODONE, traMADol   Assessment:  Status post HeartMate 3 implantation July 30, 2023 for nonischemic cardiomyopathy with biventricular dysfunction  Past medical history positive for polycythemia, asthma, and chronic Suboxone treatment for opioid abuse    Plan/Discussion:    Pain control remains a prominent issue and he will have the Suboxone resumed today.  Continue short acting opioids for postoperative surgical pain.  Chest tubes will need to remain today.  The patient will need diuresis and mobilization to the chair with PT.  Possible PA catheter removal per advanced heart failure.  Postoperative Coumadin loading has been initiated and will be covered by Pharm.D.  I reviewed the LVAD parameters from today, and compared the results to the patient's prior recorded data.  No programming changes were made.  The LVAD is functioning within specified parameters.  The patient performs LVAD self-test daily.  LVAD interrogation was negative for any significant power changes, alarms or PI events/speed drops.  LVAD equipment check completed and is in good working order.  Back-up equipment present.   LVAD education done on emergency procedures and precautions and reviewed exit site care.  Length of Stay: [address]  Lovett Sox 08/01/2023, 8:50 AM

## 2023-08-01 NOTE — Progress Notes (Signed)
CSW visited bedside although patient was resting comfortably. RN reports patient's wife was at bedside and just left to go to work for a short bit. CSW will continue to follow throughout implant hospitalization. Lasandra Beech, LCSW, CCSW-MCS [phone]

## 2023-08-01 NOTE — Progress Notes (Signed)
Nutrition Follow-up  DOCUMENTATION CODES:   Not applicable  INTERVENTION:   Ensure Enlive po TID, each supplement provides 350 kcal and 20 grams of protein.   NUTRITION DIAGNOSIS:   Increased nutrient needs related to chronic illness as evidenced by estimated needs.  Being addressed via supplements  GOAL:   Patient will meet greater than or equal to 90% of their needs  Not Met but being addressed  MONITOR:   PO intake, Supplement acceptance, Weight trends, Labs  REASON FOR ASSESSMENT:   Consult LVAD Eval  ASSESSMENT:   37 yo male admitted with acute biventricular heart failure with cardiogenic shock. PMH includes HTN, hx drug abuse (now on suboxone x 5 years), ADD, asthma, seizures.  7/22 Admitted 7/25 RD consult for possible VAD 8/07 OR: HM3 LVAD placed 8/08 Extubated  Currently on 5L [place], sitting in chair position in the bed. Pt looks very uncomfortable on visit, says he is in a lot of pain. RN is aware, pain regimen continues to be adjusted. Pt reports his ribs hurt like they are broken, hurts with each breath. Taking short shallow breaths.   Remains on Levophed at 3, Epinephrine at 2 and Milrinone0.5  in addition to iNO  Chest tube with 510 mL in 24 hours  Remains on CL diet post op Noted hypoglycemia overnight; no insulin ordered currently  Receiving potassium phosphate for phosphorus 1.8  Pt on IV reglan post op  in addition to miralax, colace, dulcolax suppository Last BM documented 8/6 (pre-op), BS present, no vomiting. Nausea improved today  Labs: phosphrous 1.8 (L), sodium 133 (L), potassium 3.9 (wdl), magnesium 2.1 (wdl), BUN/Creatinine wdl Meds: IV reglan   Diet Order:   Diet Order             Diet clear liquid Room service appropriate? Yes; Fluid consistency: Thin; Fluid restriction: 1200 mL Fluid  Diet effective now                   EDUCATION NEEDS:   Education needs have been addressed  Skin:  Skin Assessment: Skin Integrity  Issues: Skin Integrity Issues:: Incisions Incisions: Driveline site post HM3 LVAD on 07/30/23  Last BM:  8/6  Height:   Ht Readings from Last 1 Encounters:  07/15/23 5\' 7"  (1.702 m)    Weight:   Wt Readings from Last 1 Encounters:  08/01/23 73.2 kg    BMI:  Body mass index is 25.28 kg/m.  Estimated Nutritional Needs:   Kcal:  2100-2300 kcals  Protein:  110-125 g  Fluid:  1.8 L  Romelle Starcher MS, RDN, LDN, CNSC Registered Dietitian 3 Clinical Nutrition RD Pager and On-Call Pager Number Located in [place]

## 2023-08-01 NOTE — Progress Notes (Signed)
   LVAD Speed increased to 5500 and Maps running 110s.   Epi cut back to 1 mcg and remains on Milrinone 0.5 mcg. Maps now 80s.   CI 2.7.   Diuresing with 20 mg IV lasix x1. > 1.7 liters urine output.  Tarrin Lebow NP-C  2:12 PM

## 2023-08-01 NOTE — Evaluation (Signed)
Physical Therapy Evaluation Patient Details Name: Joel York MRN: [id number] DOB: 02-21-1986 Today's Date: 08/01/2023  History of Present Illness  Pt is 37 year old presented to [facility] on  07/14/23 for BLE swelling and SOB. Pt with acute biventricular systolic heart failure.  Underwent placement of LVAD on 07/30/23. 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  Clinical Impression  Pt now with LVAD and presenting with decr mobility due to post op pain and multiple lines. Expect will move well once amount of lines are reduced. Likely from mobility standpoint that pt will be able to return home mobilizing independently. Will also need to manage VAD equipment.         If plan is discharge home, recommend the following: Assist for transportation;Assistance with cooking/housework;Help with stairs or ramp for entrance   Can travel by private vehicle        Equipment Recommendations Other (comment) (To be determined)  Recommendations for Other Services       Functional Status Assessment Patient has had a recent decline in their functional status and demonstrates the ability to make significant improvements in function in a reasonable and predictable amount of time.     Precautions / Restrictions Precautions Precautions: Other (comment) Precaution Comments: LVAD      Mobility  Bed Mobility Overal bed mobility: Needs Assistance Bed Mobility: Sidelying to Sit   Sidelying to sit: Min assist, +2 for safety/equipment, HOB elevated       General bed mobility comments: Assist to elevate trunk into sitting    Transfers Overall transfer level: Needs assistance Equipment used: 1 person hand held assist Transfers: Sit to/from Stand, Bed to chair/wheelchair/BSC Sit to Stand: Min assist, +2 safety/equipment   Step pivot transfers: Min assist, +2 safety/equipment       General transfer comment: Assist to steady and support. Pivotal steps bed to chair for  additional +2 to manage lines/tubes    Ambulation/Gait               General Gait Details: No attempted  Stairs            Wheelchair Mobility     Tilt Bed    Modified Rankin (Stroke Patients Only)       Balance Overall balance assessment: No apparent balance deficits (not formally assessed)                                           Pertinent Vitals/Pain Pain Assessment Pain Assessment: Faces Faces Pain Scale: Hurts whole lot Pain Location: chest, pockey Pain Descriptors / Indicators: Grimacing, Guarding Pain Intervention(s): Limited activity within patient's tolerance, Monitored during session, Patient requesting pain meds-RN notified, Repositioned    Home Living Family/patient expects to be discharged to:: Private residence Living Arrangements: Spouse/significant other Available Help at Discharge: Family;Available 24 hours/day Type of Home: House Home Access: Stairs to enter Entrance Stairs-Rails: Right Entrance Stairs-Number of Steps: 4 Alternate Level Stairs-Number of Steps: flight Home Layout: Two level Home Equipment: None      Prior Function Prior Level of Function : Independent/Modified Independent             Mobility Comments: worked as a Presenter, broadcasting ADLs Comments: indep     Extremity/Trunk Assessment   Upper Extremity Assessment Upper Extremity Assessment: Overall WFL for tasks assessed    Lower Extremity Assessment Lower  Extremity Assessment: Overall WFL for tasks assessed       Communication   Communication Communication: No apparent difficulties  Cognition Arousal: Alert Behavior During Therapy: WFL for tasks assessed/performed Overall Cognitive Status: Within Functional Limits for tasks assessed                                          General Comments General comments (skin integrity, edema, etc.): VSS on O2    Exercises     Assessment/Plan    PT Assessment  Patient needs continued PT services  PT Problem List Decreased activity tolerance;Decreased mobility;Decreased knowledge of precautions;Decreased knowledge of use of DME;Pain       PT Treatment Interventions DME instruction;Gait training;Stair training;Functional mobility training;Therapeutic activities;Therapeutic exercise;Patient/family education    PT Goals (Current goals can be found in the Care Plan section)  Acute Rehab PT Goals Patient Stated Goal: to walk without pain PT Goal Formulation: With patient Time For Goal Achievement: 08/15/23 Potential to Achieve Goals: Good    Frequency Min 1X/week     Co-evaluation               AM-PAC PT "6 Clicks" Mobility  Outcome Measure Help needed turning from your back to your side while in a flat bed without using bedrails?: A Little Help needed moving from lying on your back to sitting on the side of a flat bed without using bedrails?: A Little Help needed moving to and from a bed to a chair (including a wheelchair)?: A Little Help needed standing up from a chair using your arms (e.g., wheelchair or bedside chair)?: A Little Help needed to walk in hospital room?: Total Help needed climbing 3-5 steps with a railing? : Total 6 Click Score: 14    End of Session Equipment Utilized During Treatment: Oxygen Activity Tolerance: Patient tolerated treatment well Patient left: in chair;with York bell/phone within reach;with nursing/sitter in room Nurse Communication: Mobility status (Nurse assisted) PT Visit Diagnosis: Difficulty in walking, not elsewhere classified (R26.2);Pain Pain - part of body:  (chest)    Time: 0272-5366 PT Time Calculation (min) (ACUTE ONLY): 20 min   Charges:   PT Evaluation $PT Eval High Complexity: 1 High   PT General Charges $$ ACUTE PT VISIT: 1 Visit         [facility] PT Acute Rehabilitation Services Office [phone]   [name] [facility] 08/01/2023, 5:48 PM

## 2023-08-01 NOTE — Plan of Care (Signed)

## 2023-08-02 ENCOUNTER — Inpatient Hospital Stay (HOSPITAL_COMMUNITY): Payer: Medicaid Other

## 2023-08-02 DIAGNOSIS — J90 Pleural effusion, not elsewhere classified: Secondary | ICD-10-CM | POA: Diagnosis not present

## 2023-08-02 DIAGNOSIS — I428 Other cardiomyopathies: Secondary | ICD-10-CM | POA: Diagnosis not present

## 2023-08-02 DIAGNOSIS — Z452 Encounter for adjustment and management of vascular access device: Secondary | ICD-10-CM | POA: Diagnosis not present

## 2023-08-02 DIAGNOSIS — R918 Other nonspecific abnormal finding of lung field: Secondary | ICD-10-CM | POA: Diagnosis not present

## 2023-08-02 DIAGNOSIS — Z95811 Presence of heart assist device: Secondary | ICD-10-CM | POA: Diagnosis not present

## 2023-08-02 DIAGNOSIS — I5021 Acute systolic (congestive) heart failure: Secondary | ICD-10-CM | POA: Diagnosis not present

## 2023-08-02 DIAGNOSIS — R0989 Other specified symptoms and signs involving the circulatory and respiratory systems: Secondary | ICD-10-CM | POA: Diagnosis not present

## 2023-08-02 DIAGNOSIS — I517 Cardiomegaly: Secondary | ICD-10-CM | POA: Diagnosis not present

## 2023-08-02 DIAGNOSIS — R570 Cardiogenic shock: Secondary | ICD-10-CM | POA: Diagnosis not present

## 2023-08-02 DIAGNOSIS — I5082 Biventricular heart failure: Secondary | ICD-10-CM | POA: Diagnosis not present

## 2023-08-02 DIAGNOSIS — J9811 Atelectasis: Secondary | ICD-10-CM | POA: Diagnosis not present

## 2023-08-02 LAB — GLUCOSE, CAPILLARY
Glucose-Capillary: 124 mg/dL — ABNORMAL HIGH (ref 70–99)
Glucose-Capillary: 225 mg/dL — ABNORMAL HIGH (ref 70–99)
Glucose-Capillary: 129 mg/dL — ABNORMAL HIGH (ref 70–99)
Glucose-Capillary: 95 mg/dL (ref 70–99)
Glucose-Capillary: 144 mg/dL — ABNORMAL HIGH (ref 70–99)
Glucose-Capillary: 158 mg/dL — ABNORMAL HIGH (ref 70–99)

## 2023-08-02 LAB — PROTIME-INR
INR: 4.4 (ref 0.8–1.2)
Prothrombin Time: 42.4 seconds — ABNORMAL HIGH (ref 11.4–15.2)

## 2023-08-02 MED ORDER — POTASSIUM CHLORIDE CRYS ER 20 MEQ PO TBCR
20.0000 meq | EXTENDED_RELEASE_TABLET | ORAL | Status: AC
  Administered 2023-08-02 (×3): 20 meq via ORAL
  Filled 2023-08-02 (×3): qty 1

## 2023-08-02 MED ORDER — SENNOSIDES-DOCUSATE SODIUM 8.6-50 MG PO TABS
2.0000 | ORAL_TABLET | Freq: Every day | ORAL | Status: DC
  Administered 2023-08-02 – 2023-08-06 (×5): 2 via ORAL
  Filled 2023-08-02 (×5): qty 2

## 2023-08-02 MED ORDER — WARFARIN - PHARMACIST DOSING INPATIENT: Freq: Every day | Status: DC

## 2023-08-02 MED ORDER — SODIUM CHLORIDE 0.9% IV SOLUTION: Freq: Once | INTRAVENOUS | Status: AC

## 2023-08-02 MED ORDER — SORBITOL 70 % SOLN
30.0000 mL | Freq: Once | Status: AC
  Administered 2023-08-02: 30 mL via ORAL
  Filled 2023-08-02: qty 30

## 2023-08-02 MED ORDER — FENTANYL CITRATE PF 50 MCG/ML IJ SOSY
50.0000 ug | PREFILLED_SYRINGE | Freq: Once | INTRAMUSCULAR | Status: AC
  Administered 2023-08-02: 50 ug via INTRAVENOUS

## 2023-08-02 MED ORDER — MILRINONE LACTATE IN DEXTROSE 20-5 MG/100ML-% IV SOLN
0.1250 ug/kg/min | INTRAVENOUS | Status: DC
  Administered 2023-08-02 – 2023-08-03 (×3): 0.375 ug/kg/min via INTRAVENOUS
  Administered 2023-08-03: 0.25 ug/kg/min via INTRAVENOUS
  Administered 2023-08-04: 0.125 ug/kg/min via INTRAVENOUS
  Filled 2023-08-02 (×4): qty 100

## 2023-08-02 MED ORDER — POTASSIUM PHOSPHATES 15 MMOLE/5ML IV SOLN
30.0000 mmol | Freq: Once | INTRAVENOUS | Status: AC
  Administered 2023-08-02: 30 mmol via INTRAVENOUS
  Filled 2023-08-02: qty 10

## 2023-08-02 MED ORDER — FENTANYL CITRATE PF 50 MCG/ML IJ SOSY
PREFILLED_SYRINGE | INTRAMUSCULAR | Status: AC
  Filled 2023-08-02: qty 1

## 2023-08-02 MED ORDER — FUROSEMIDE 10 MG/ML IJ SOLN
20.0000 mg | Freq: Two times a day (BID) | INTRAMUSCULAR | Status: DC
  Administered 2023-08-02 – 2023-08-03 (×3): 20 mg via INTRAVENOUS
  Filled 2023-08-02 (×3): qty 2

## 2023-08-02 MED ORDER — HYDRALAZINE HCL 20 MG/ML IJ SOLN
10.0000 mg | Freq: Four times a day (QID) | INTRAMUSCULAR | Status: DC | PRN
  Administered 2023-08-02 – 2023-08-05 (×7): 10 mg via INTRAVENOUS
  Filled 2023-08-02 (×7): qty 1

## 2023-08-02 NOTE — Progress Notes (Signed)
Advanced Heart Failure VAD Team Note  PCP-Cardiologist: None   Subjective:   8/7 S/P HMIII LVAD 8/8 Extubated.   Currently on Milrinone 0.5 mcg + Epi 1 mcg.  Off NE  C/o chest soreness. Passing gas. No BM yet.   MAPS 120s  INR 4.1 (?)   LVAD INTERROGATION:  HeartMate III LVAD:   Flow 3.2  liters/min, speed 5500, power 4, PI 7.4   Objective:    Vital Signs:   Temp:  [96.3 F (35.7 C)-100.2 F (37.9 C)] 98.4 F (36.9 C) (08/10 1000) Pulse Rate:  [106-147] 123 (08/10 1000) Resp:  [18-20] 18 (08/10 0846) BP: (131)/(111) 131/111 (08/10 1000) SpO2:  [89 %-98 %] 95 % (08/10 1000) Arterial Line BP: (102-137)/(78-107) 130/97 (08/10 1000) Weight:  [70.5 kg] 70.5 kg (08/10 0500) Last BM Date : 07/29/23 Mean arterial Pressure 100-120s   Intake/Output:   Intake/Output Summary (Last 24 hours) at 08/02/2023 1123 Last data filed at 08/02/2023 1100 Gross per 24 hour  Intake 1722.89 ml  Output 3400 ml  Net -1677.11 ml     Physical Exam    General:  Lying in bed. Uncomfortable  HEENT: normal  Neck: supple. LIJ swan  Carotids 2+ bilat; no bruits. No lymphadenopathy or thryomegaly appreciated. Cor: Surgical dressing ok LVAD hum.  Lungs: Clear. Abdomen: obese soft, nontender, + distended. No hepatosplenomegaly. No bruits or masses. Good bowel sounds. Driveline site clean. Anchor in place.  Extremities: no cyanosis, clubbing, rash. TRedema  Neuro: alert & oriented x 3. No focal deficits. Moves all 4 without problem    Telemetry   ST 110-120s Personally reviewed   Labs   Basic Metabolic Panel: Recent Labs  Lab 07/30/23 2000 07/31/23 0428 07/31/23 0436 07/31/23 1652 07/31/23 1704 08/01/23 0426 08/01/23 1644 08/02/23 0336  NA 135  140 136   < > 136 137 133* 133* 134*  K 4.1  4.3 4.5   < > 3.9 4.0 3.9 4.1 3.7  CL 102 107  --  102  --  96*  --  95*  CO2 24 22  --  25  --  25  --  28  GLUCOSE 116* 91  --  148*  --  121*  --  119*  BUN 19 16  --  16  --  10  --  6   CREATININE 1.22 1.09  --  1.16  --  1.07  --  1.03  CALCIUM 7.0* 7.2*  --  7.4*  --  8.2*  --  8.4*  MG 3.1* 2.5*  --  2.2  --  2.1  --  1.7  PHOS  --  3.1  --   --   --  1.8*  --  1.5*   < > = values in this interval not displayed.    Liver Function Tests: Recent Labs  Lab 07/30/23 0519 07/31/23 0428 08/01/23 0426 08/02/23 0336  AST 34 129* 121* 54*  ALT 47* 35 34 23  ALKPHOS 85 53 65 66  BILITOT 0.5 2.0* 1.8* 0.5  PROT 6.6 5.8* 5.9* 5.6*  ALBUMIN 3.6 3.5 3.1* 2.7*   No results for input(s): "LIPASE", "AMYLASE" in the last 168 hours. No results for input(s): "AMMONIA" in the last 168 hours.  CBC: Recent Labs  Lab 07/28/23 0320 07/28/23 0902 07/29/23 0325 07/30/23 6045 07/30/23 0540 07/30/23 1504 07/30/23 1521 07/30/23 2000 07/31/23 0436 07/31/23 1652 07/31/23 1704 08/01/23 0426 08/01/23 1644 08/02/23 0336  WBC 6.1  --  5.9 6.5  --  12.0*  --  9.0  --  10.0  --  11.5*  --  11.5*  NEUTROABS 2.6  --  2.7 2.9  --   --   --   --   --   --   --  9.4*  --  8.6*  HGB 14.4   < > 13.9 16.4   < > 12.2*   < > 11.8*  11.9*   < > 12.0* 12.6* 11.9* 13.3 12.5*  HCT 44.1   < > 42.5 49.6   < > 37.2*   < > 36.0*  35.0*   < > 37.3* 37.0* 36.6* 39.0 39.1  MCV 89.1  --  88.9 88.4  --  88.8  --  89.1  --  93.0  --  90.6  --  90.5  PLT 199  --  207 257   < > 176  176  --  168  --  169  --  170  --  206   < > = values in this interval not displayed.    INR: Recent Labs  Lab 07/30/23 0624 07/30/23 1504 07/31/23 0428 08/01/23 0426 08/02/23 0336  INR 1.1 1.3*  DUPLICATE 1.4* 1.5* 4.2*    Other results:   Imaging   DG Chest Port 1 View  Result Date: 08/02/2023 CLINICAL DATA:  37 year old male status post implantation of left ventricular cyst device. EXAM: PORTABLE CHEST 1 VIEW COMPARISON:  Chest x-ray 08/01/2023. FINDINGS: Right internal jugular central venous catheter with tip terminating in the mid superior vena cava. Left internal jugular Cordis through which a  Swan-Ganz catheter has been passed into the proximal right main pulmonary artery. Left-sided chest tube with tip projecting over the medial aspect of the mid left hemithorax. Left ventricular assist device projecting over the cardiac apex. Lung volumes are low. Extensive bibasilar opacities (left-greater-than-right) which may reflect areas of atelectasis and/or consolidation, with superimposed small right and small to moderate left pleural effusions. No appreciable pneumothorax confidently identified. Pulmonary vasculature does not appear engorged. Heart size is mildly enlarged. The patient is rotated to the left on today's exam, resulting in distortion of the mediastinal contours and reduced diagnostic sensitivity and specificity for mediastinal pathology. Status post median sternotomy. IMPRESSION: 1. Postoperative changes and support apparatus, as above. 2. Persistent bibasilar opacities which may reflect areas of atelectasis and/or consolidation with superimposed small right and moderate left pleural effusions. 3. Cardiomegaly. Electronically Signed   By: Trudie Reed M.D.   On: 08/02/2023 08:22   DG Chest Port 1 View  Result Date: 08/01/2023 CLINICAL DATA:  37 year old male status post left ventricular assist device placement. EXAM: PORTABLE CHEST 1 VIEW COMPARISON:  Chest x-ray 07/31/2023. FINDINGS: Right internal jugular central venous catheter with tip terminating in the distal superior vena cava. Left internal jugular Cordis through which a Swan-Ganz catheter has been passed into the right main pulmonary artery. Left-sided chest tube in position with tip in the mid left hemithorax. Left ventricular assist device noted projecting over the lower left hemithorax and left upper quadrant of the abdomen. Lung volumes are low. Bibasilar opacities are noted (left-greater-than-right), favored to reflect areas of postoperative atelectasis, likely with superimposed small right and moderate left pleural  effusions. No appreciable pneumothorax. No evidence of pulmonary edema. Cardiac silhouette remains enlarged. The patient is rotated to the right on today's exam, resulting in distortion of the mediastinal contours and reduced diagnostic sensitivity and specificity for mediastinal pathology. Median sternotomy wires. IMPRESSION: 1. Postoperative changes and support  apparatus, as above. 2. Low lung volumes with worsening bibasilar opacities favored to reflect increasing areas of postoperative subsegmental atelectasis, with superimposed small right and moderate left pleural effusions. 3. Cardiomegaly. Electronically Signed   By: Trudie Reed M.D.   On: 08/01/2023 07:45   DG Chest 1 View  Result Date: 07/31/2023 CLINICAL DATA:  [id number] Encounter for central line placement [id number] EXAM: CHEST  1 VIEW COMPARISON:  Earlier the same day at 6 a.m. FINDINGS: Low lung volume. Redemonstration of predominantly central pulmonary vascular congestion. Persistent left retrocardiac opacity. Bilateral lungs are otherwise clear. No pneumothorax. Left lateral costophrenic angle is obscured by overlying support apparatus. Right lateral costophrenic angle is grossly clear. No pneumothorax. Stable cardio-mediastinal silhouette. No acute osseous abnormalities. The soft tissues are within normal limits. Tube/lines: *Right IJ Swan-Ganz sheath with its tip overlying the upper portion of superior vena cava-unchanged. *Right IJ Swan-Ganz catheter with its tip overlying the midportion of superior vena cava-unchanged. *Left IJ Swan-Ganz catheter with its sheath overlying the proximal left brachiocephalic vein and the catheter tip overlying the right main pulmonary trunk-unchanged. *Stable positioning of the left-sided pleural drainage catheter. *Stable LVAD. *Interval removal of previously seen enteric and endotracheal tubes. IMPRESSION: Interval removal of enteric and endotracheal tubes. Otherwise no significant interval change.  Electronically Signed   By: Jules Schick M.D.   On: 07/31/2023 14:26     Medications:     Scheduled Medications:  acetaminophen  1,000 mg Oral Q6H   bisacodyl  10 mg Oral Daily   buprenorphine-naloxone  1 tablet Sublingual Daily   Chlorhexidine Gluconate Cloth  6 each Topical Daily   digoxin  0.125 mg Oral Daily   docusate sodium  200 mg Oral Daily   feeding supplement  237 mL Oral TID WC   furosemide  20 mg Intravenous BID   gabapentin  300 mg Oral BID   levalbuterol  1.25 mg Nebulization Q6H   lidocaine  2 patch Transdermal Q24H   loratadine  10 mg Oral QPM   melatonin  3 mg Oral QHS   metoCLOPramide (REGLAN) injection  10 mg Intravenous Q6H   mexiletine  200 mg Oral BID   mometasone-formoterol  2 puff Inhalation BID   montelukast  10 mg Oral q morning   pantoprazole  40 mg Oral Daily   polyethylene glycol  17 g Oral Daily   potassium chloride  20 mEq Oral Q4H   senna-docusate  2 tablet Oral QHS   simethicone  80 mg Oral QID   sodium chloride flush  10-40 mL Intracatheter Q12H   sodium chloride flush  3 mL Intravenous Q12H   sorbitol  30 mL Oral Once   Warfarin - Physician Dosing Inpatient   Does not apply q1600    Infusions:  sodium chloride Stopped (07/31/23 0525)   sodium chloride 1 mL/hr at 08/02/23 1100   sodium chloride 10 mL/hr at 08/02/23 1100   lactated ringers     lactated ringers     lactated ringers Stopped (08/01/23 0930)   milrinone     potassium PHOSPHATE IVPB (in mmol)      PRN Medications: sodium chloride, acetaminophen, cyclobenzaprine, fluticasone, hydrALAZINE, lactated ringers, LORazepam, morphine injection, nitroGLYCERIN, ondansetron (ZOFRAN) IV, oxyCODONE, traMADol   Patient Profile   Joel York is a 37 y.o. male with HTN, ADD, asthma, hx drug abuse (now on suboxone x5 yrs), and seizures. Admitted with acute systolic heart failure. NYHA IV -> shock.   S/P HMIII LVAD   Assessment/Plan:  1. S/P HMIII LVAD  on 8/6 - MAPs high.  -  Still with significant pulsativity - Increase speed to 5600 - Stop epi.  - Use hydralazine for afterload reduction - Recheck INR - Pain control. Avoid fentanyl - Mobilize/encouraged IS - Sorbitol for bowels  2. Acute biventricular systolic heart failure >> cardiogenic shock - Admitted NYHA IV symptoms .Echo EF <20%,  RV mildly reduced, RV mod enlarged, estimated RV systolic pressure 53.66mmHg, LA mod dilated, RA mod dilated, mild-mod MR/TR - Etiology uncertain. ? 2/2 PVCs vs genetically mediated +/- hypertension.  - LHC with normal coronaries. RHC 07/24: Low CO (Fick CO/CI 3.3/1.9) and PAPi 3.0 on milrinone 0.375 . Off NE - cMRI:  LV markedly dilated EF 10% RV 17% NICM - Limited echo 07/29: LVEF < 20%, RV function improved. - Discussed with [facility] not a candidate for transplant with smoking.  - S/P HMIII as above.  - Decrease milrinone to 0.375. Stop epi - diurese gently - PRN hydral for AL reduction   3. Chest pressure - HsTrop 39>43>37>40, d/t demand ischemia - No CAD on cath   4. PVCs - snores, will need eventual sleep study  - Suppressed with Mexiletine 200 mg BID - TSH 4.9, T4 0.79 - Keep K>4 and Mg >2   5. AKI - resolved.   6. Tobacco use - smokes 3-6 cigarettes / day - cessation discussed   7. Hx drug abuse - Continue suboxone - minimize narcotics  OOB today. PT consulted.   I reviewed the LVAD parameters from today, and compared the results to the patient's prior recorded data.  No programming changes were made.  The LVAD is functioning within specified parameters.  The patient performs LVAD self-test daily.  LVAD interrogation was negative for any significant power changes, alarms or PI events/speed drops.  LVAD equipment check completed and is in good working order.  Back-up equipment present.   LVAD education done on emergency procedures and precautions and reviewed exit site care.  CRITICAL CARE Performed by: [name]  Total critical care time: 45  minutes  Critical care time was exclusive of separately billable procedures and treating other patients.  Critical care was necessary to treat or prevent imminent or life-threatening deterioration.  Critical care was time spent personally by me (independent of midlevel providers or residents) on the following activities: development of treatment plan with patient and/or surrogate as well as nursing, discussions with consultants, evaluation of patient's response to treatment, examination of patient, obtaining history from patient or surrogate, ordering and performing treatments and interventions, ordering and review of laboratory studies, ordering and review of radiographic studies, pulse oximetry and re-evaluation of patient's condition.   Length of Stay: 44  [name], MD 08/02/2023, 11:23 AM  VAD Team --- VAD ISSUES ONLY--- Pager [phone] (7am - 7am)  Advanced Heart Failure Team  Pager [phone] (M-F; 7a - 5p)  Please contact [facility] for night-coverage after hours (5p -7a ) and weekends on [email or web address]

## 2023-08-02 NOTE — Progress Notes (Signed)
HeartMate 3 Rounding Note Postop day #3 Subjective:   37 year old male with nonischemic cardiomyopathy presented with cardiogenic shock from severe LV dysfunction and moderate to severe RV dysfunction.  After full evaluation and counseling he underwent implantation of HeartMate 3 July 30, 2023  Patient states surgical pain is still high Suboxone has been resumed OOB to chair- will DC 2 chest tubes today Off pressors and milrinone down to 0.375 sowill DC swan  VAD parameters are satisfactory, flows slightly lower with elevated MAP, speed up to 5500 rpm    INR  1.5>>4.4>>4.5 FFP x2 today and hold Coumadin  Patient denies nausea, no flatus, x-ray shows ileus and the patient is on IV Reglan every 6 hours.  Continue clear liquid diet only.   LVAD INTERROGATION:  HeartMate II LVAD:  Flow 3.8 liters/min, speed 5500, power 3.5, PI 3.7.  Controller intact  Objective:    Vital Signs:   Temp:  [96.3 F (35.7 C)-100.2 F (37.9 C)] 99 F (37.2 C) (08/10 1400) Pulse Rate:  [106-131] 131 (08/10 1400) Resp:  [18-20] 18 (08/10 1412) BP: (113-131)/(99-111) 113/99 (08/10 1240) SpO2:  [90 %-98 %] 92 % (08/10 1400) Arterial Line BP: (102-137)/(78-107) 125/89 (08/10 1400) Weight:  [70.5 kg] 70.5 kg (08/10 0500) Last BM Date : 07/29/23 Mean arterial Pressure 70 mm Hg  Intake/Output:   Intake/Output Summary (Last 24 hours) at 08/02/2023 1616 Last data filed at 08/02/2023 1455 Gross per 24 hour  Intake 1115.2 ml  Output 5105 ml  Net -3989.8 ml     Physical Exam: General:  Well appearing. No resp difficulty.  Complains of left chest pocket pain. HEENT: normal Neck: supple. JVP . Carotids 2+ bilat; no bruits. No lymphadenopathy or thryomegaly appreciated. Cor: Mechanical heart sounds with LVAD hum present. Lungs: clear Abdomen: soft, nontender, nondistended. No hepatosplenomegaly. No bruits or masses.  Diminished bowel sounds. Extremities: no cyanosis, clubbing, rash, edema Neuro: alert &  orientedx3, cranial nerves grossly intact. moves all 4 extremities w/o difficulty.  Okay pleasant  Telemetry: Sinus tach  Labs: Basic Metabolic Panel: Recent Labs  Lab 07/30/23 2000 07/31/23 0428 07/31/23 0436 07/31/23 1652 07/31/23 1704 08/01/23 0426 08/01/23 1644 08/02/23 0336  NA 135  140 136   < > 136 137 133* 133* 134*  K 4.1  4.3 4.5   < > 3.9 4.0 3.9 4.1 3.7  CL 102 107  --  102  --  96*  --  95*  CO2 24 22  --  25  --  25  --  28  GLUCOSE 116* 91  --  148*  --  121*  --  119*  BUN 19 16  --  16  --  10  --  6  CREATININE 1.22 1.09  --  1.16  --  1.07  --  1.03  CALCIUM 7.0* 7.2*  --  7.4*  --  8.2*  --  8.4*  MG 3.1* 2.5*  --  2.2  --  2.1  --  1.7  PHOS  --  3.1  --   --   --  1.8*  --  1.5*   < > = values in this interval not displayed.    Liver Function Tests: Recent Labs  Lab 07/30/23 0519 07/31/23 0428 08/01/23 0426 08/02/23 0336  AST 34 129* 121* 54*  ALT 47* 35 34 23  ALKPHOS 85 53 65 66  BILITOT 0.5 2.0* 1.8* 0.5  PROT 6.6 5.8* 5.9* 5.6*  ALBUMIN 3.6 3.5 3.1* 2.7*  No results for input(s): "LIPASE", "AMYLASE" in the last 168 hours. No results for input(s): "AMMONIA" in the last 168 hours.  CBC: Recent Labs  Lab 07/28/23 0320 07/28/23 0902 07/29/23 0325 07/30/23 0519 07/30/23 0540 07/30/23 1504 07/30/23 1521 07/30/23 2000 07/31/23 0436 07/31/23 1652 07/31/23 1704 08/01/23 0426 08/01/23 1644 08/02/23 0336  WBC 6.1  --  5.9 6.5  --  12.0*  --  9.0  --  10.0  --  11.5*  --  11.5*  NEUTROABS 2.6  --  2.7 2.9  --   --   --   --   --   --   --  9.4*  --  8.6*  HGB 14.4   < > 13.9 16.4   < > 12.2*   < > 11.8*  11.9*   < > 12.0* 12.6* 11.9* 13.3 12.5*  HCT 44.1   < > 42.5 49.6   < > 37.2*   < > 36.0*  35.0*   < > 37.3* 37.0* 36.6* 39.0 39.1  MCV 89.1  --  88.9 88.4  --  88.8  --  89.1  --  93.0  --  90.6  --  90.5  PLT 199  --  207 257   < > 176  176  --  168  --  169  --  170  --  206   < > = values in this interval not displayed.     INR: Recent Labs  Lab 07/30/23 1504 07/31/23 0428 08/01/23 0426 08/02/23 0336 08/02/23 1206  INR 1.3*  DUPLICATE 1.4* 1.5* 4.2* 4.4*    Other results: EKG:   Imaging: A.m. chest x-ray personally reviewed.   Medications:     Scheduled Medications:  acetaminophen  1,000 mg Oral Q6H   bisacodyl  10 mg Oral Daily   buprenorphine-naloxone  1 tablet Sublingual Daily   Chlorhexidine Gluconate Cloth  6 each Topical Daily   digoxin  0.125 mg Oral Daily   docusate sodium  200 mg Oral Daily   feeding supplement  237 mL Oral TID WC   fentaNYL (SUBLIMAZE) injection  50 mcg Intravenous Once   furosemide  20 mg Intravenous BID   gabapentin  300 mg Oral BID   levalbuterol  1.25 mg Nebulization Q6H   lidocaine  2 patch Transdermal Q24H   loratadine  10 mg Oral QPM   melatonin  3 mg Oral QHS   metoCLOPramide (REGLAN) injection  10 mg Intravenous Q6H   mexiletine  200 mg Oral BID   mometasone-formoterol  2 puff Inhalation BID   montelukast  10 mg Oral q morning   pantoprazole  40 mg Oral Daily   polyethylene glycol  17 g Oral Daily   potassium chloride  20 mEq Oral Q4H   senna-docusate  2 tablet Oral QHS   simethicone  80 mg Oral QID   sodium chloride flush  10-40 mL Intracatheter Q12H   sodium chloride flush  3 mL Intravenous Q12H   Warfarin - Pharmacist Dosing Inpatient   Does not apply q1600    Infusions:  sodium chloride Stopped (07/31/23 0525)   sodium chloride 1 mL/hr at 08/02/23 1100   sodium chloride 10 mL/hr at 08/02/23 1100   lactated ringers     lactated ringers     lactated ringers 10 mL/hr at 08/02/23 1435   milrinone 0.375 mcg/kg/min (08/02/23 1015)   potassium PHOSPHATE IVPB (in mmol) 30 mmol (08/02/23 1214)    PRN Medications: sodium chloride,  acetaminophen, cyclobenzaprine, fluticasone, hydrALAZINE, lactated ringers, LORazepam, morphine injection, nitroGLYCERIN, ondansetron (ZOFRAN) IV, oxyCODONE, traMADol   Assessment:  Status post HeartMate 3  implantation July 30, 2023 for nonischemic cardiomyopathy with biventricular dysfunction  Past medical history positive for polycythemia, asthma, and chronic Suboxone treatment for opioid abuse    Plan/Discussion:    Pain control remains a prominent issue and he will have the Suboxone resumed. Using po short acting opiates  Chest tubes with low drainage- will DC 2 tubes  and leave soft pocket drain  Postoperative Coumadin loading has been initiated and will be covered by Pharm.D. Hold coumadin tonight  DC swan , continue CVP. Coox  I reviewed the LVAD parameters from today, and compared the results to the patient's prior recorded data.  No programming changes were made.  The LVAD is functioning within specified parameters.  The patient performs LVAD self-test daily.  LVAD interrogation was negative for any significant power changes, alarms or PI events/speed drops.  LVAD equipment check completed and is in good working order.  Back-up equipment present.   LVAD education done on emergency procedures and precautions and reviewed exit site care.  Length of Stay: [address]  Lovett Sox 08/02/2023, 4:16 PM

## 2023-08-02 NOTE — Plan of Care (Signed)
  Problem: Education: Goal: Knowledge of General Education information will improve Description: Including pain rating scale, medication(s)/side effects and non-pharmacologic comfort measures Outcome: Progressing   Problem: Health Behavior/Discharge Planning: Goal: Ability to manage health-related needs will improve Outcome: Progressing   Problem: Activity: Goal: Risk for activity intolerance will decrease Outcome: Progressing   Problem: Nutrition: Goal: Adequate nutrition will be maintained Outcome: Progressing   Problem: Coping: Goal: Level of anxiety will decrease Outcome: Progressing   Problem: Safety: Goal: Ability to remain free from injury will improve Outcome: Progressing   Problem: Skin Integrity: Goal: Risk for impaired skin integrity will decrease Outcome: Progressing   Problem: Education: Goal: Ability to demonstrate management of disease process will improve Outcome: Progressing Goal: Ability to verbalize understanding of medication therapies will improve Outcome: Progressing

## 2023-08-02 NOTE — Progress Notes (Signed)
Drive Line: Existing VAD dressing removed and site care performed using sterile technique. Drive line exit site cleaned with Chlora prep applicators x 2, allowed to dry, and silver strip with gauze applied. Exit site healing  the velour is fully implanted at exit site .  Next VAD dressing 08/03/23

## 2023-08-02 NOTE — Progress Notes (Signed)
ANTICOAGULATION CONSULT NOTE - Consult  Pharmacy Consult for warfarin Indication:  LVAD  Allergies  Allergen Reactions   Other Anaphylaxis    Tree Nuts   Peanuts [Peanut Oil] Anaphylaxis    Patient Measurements: Height: 5\' 7"  (170.2 cm) Weight: 70.5 kg (155 lb 6.8 oz) IBW/kg (Calculated) : 66.1   Vital Signs: Temp: 98.4 F (36.9 C) (08/10 1000) Temp Source: Core (08/10 0700) BP: 113/99 (08/10 1240) Pulse Rate: 123 (08/10 1000)  Labs: Recent Labs    07/30/23 1504 07/30/23 1521 07/31/23 1652 07/31/23 1704 08/01/23 0426 08/01/23 1644 08/02/23 0336 08/02/23 1206  HGB 12.2*   < > 12.0*   < > 11.9* 13.3 12.5*  --   HCT 37.2*   < > 37.3*   < > 36.6* 39.0 39.1  --   PLT 176  176   < > 169  --  170  --  206  --   APTT 32  DUPLICATE  --   --   --   --   --   --   --   LABPROT 16.1*  DUPLICATE   < >  --   --  18.6*  --  40.7* 42.4*  INR 1.3*  DUPLICATE   < >  --   --  1.5*  --  4.2* 4.4*  CREATININE 1.28*   < > 1.16  --  1.07  --  1.03  --    < > = values in this interval not displayed.    Estimated Creatinine Clearance: 92.7 mL/min (by C-G formula based on SCr of 1.03 mg/dL).   Medical History: Past Medical History:  Diagnosis Date   Acid reflux    ADHD (attention deficit hyperactivity disorder)    Asthma    Back pain    Seizures (HCC)    Resolved      Assessment: 37yom s/p LVAD started on warfarin post op  INR trended up quickly 1.4>4.4 (confirmed with recheck) with only 2 low doses warfarin no bleeding noted  Tbili improved 1.8>0.5 ALT improved 100>50,LDH stable 400s fibrinogen stable 300 h/h stable 12/39 pltc stable 150-200  Ensure BID  Goal of Therapy:  INR goal 2-2.5 Monitor platelets by anticoagulation protocol: Yes   Plan:  Repeat 1ut FFP No warfarin tonight Daily Protime cbc and hepatic function   Leota Sauers Pharm.D. CPP, BCPS Clinical Pharmacist [phone] 08/02/2023 2:08 PM

## 2023-08-03 ENCOUNTER — Inpatient Hospital Stay (HOSPITAL_COMMUNITY): Payer: Medicaid Other

## 2023-08-03 ENCOUNTER — Other Ambulatory Visit: Payer: Self-pay

## 2023-08-03 DIAGNOSIS — I517 Cardiomegaly: Secondary | ICD-10-CM | POA: Diagnosis not present

## 2023-08-03 DIAGNOSIS — I5082 Biventricular heart failure: Secondary | ICD-10-CM | POA: Diagnosis not present

## 2023-08-03 DIAGNOSIS — R0989 Other specified symptoms and signs involving the circulatory and respiratory systems: Secondary | ICD-10-CM | POA: Diagnosis not present

## 2023-08-03 DIAGNOSIS — R918 Other nonspecific abnormal finding of lung field: Secondary | ICD-10-CM | POA: Diagnosis not present

## 2023-08-03 DIAGNOSIS — Z452 Encounter for adjustment and management of vascular access device: Secondary | ICD-10-CM | POA: Diagnosis not present

## 2023-08-03 DIAGNOSIS — I5021 Acute systolic (congestive) heart failure: Secondary | ICD-10-CM | POA: Diagnosis not present

## 2023-08-03 LAB — POCT I-STAT 7, (LYTES, BLD GAS, ICA,H+H)
Acid-Base Excess: 4 mmol/L — ABNORMAL HIGH (ref 0.0–2.0)
Bicarbonate: 27.3 mmol/L (ref 20.0–28.0)
Calcium, Ion: 1.14 mmol/L — ABNORMAL LOW (ref 1.15–1.40)
HCT: 36 % — ABNORMAL LOW (ref 39.0–52.0)
Hemoglobin: 12.2 g/dL — ABNORMAL LOW (ref 13.0–17.0)
O2 Saturation: 98 %
Patient temperature: 36.7
Potassium: 3.5 mmol/L (ref 3.5–5.1)
Sodium: 136 mmol/L (ref 135–145)
TCO2: 28 mmol/L (ref 22–32)
pCO2 arterial: 34.1 mmHg (ref 32–48)
pH, Arterial: 7.51 — ABNORMAL HIGH (ref 7.35–7.45)
pO2, Arterial: 90 mmHg (ref 83–108)

## 2023-08-03 LAB — COMPREHENSIVE METABOLIC PANEL WITH GFR
ALT: 19 U/L (ref 0–44)
AST: 32 U/L (ref 15–41)
Albumin: 2.8 g/dL — ABNORMAL LOW (ref 3.5–5.0)
Alkaline Phosphatase: 70 U/L (ref 38–126)
Anion gap: 11 (ref 5–15)
BUN: 8 mg/dL (ref 6–20)
CO2: 26 mmol/L (ref 22–32)
Calcium: 8.3 mg/dL — ABNORMAL LOW (ref 8.9–10.3)
Chloride: 99 mmol/L (ref 98–111)
Creatinine, Ser: 0.91 mg/dL (ref 0.61–1.24)
GFR, Estimated: >60 mL/min (ref >60)
Glucose, Bld: 95 mg/dL (ref 70–99)
Potassium: 3.5 mmol/L (ref 3.5–5.1)
Sodium: 136 mmol/L (ref 135–145)
Total Bilirubin: 0.5 mg/dL (ref 0.3–1.2)
Total Protein: 6.2 g/dL — ABNORMAL LOW (ref 6.5–8.1)

## 2023-08-03 LAB — CBC WITH DIFFERENTIAL/PLATELET
Abs Immature Granulocytes: 0.04 10*3/uL (ref 0.00–0.07)
Basophils Absolute: 0.1 10*3/uL (ref 0.0–0.1)
Basophils Relative: 1 %
Eosinophils Absolute: 0.3 10*3/uL (ref 0.0–0.5)
Eosinophils Relative: 3 %
HCT: 36.7 % — ABNORMAL LOW (ref 39.0–52.0)
Hemoglobin: 12.3 g/dL — ABNORMAL LOW (ref 13.0–17.0)
Immature Granulocytes: 0 %
Lymphocytes Relative: 17 %
Lymphs Abs: 1.7 10*3/uL (ref 0.7–4.0)
MCH: 30.2 pg (ref 26.0–34.0)
MCHC: 33.5 g/dL (ref 30.0–36.0)
MCV: 90.2 fL (ref 80.0–100.0)
Monocytes Absolute: 1.2 10*3/uL — ABNORMAL HIGH (ref 0.1–1.0)
Monocytes Relative: 13 %
Neutro Abs: 6.5 10*3/uL (ref 1.7–7.7)
Neutrophils Relative %: 66 %
Platelets: 248 10*3/uL (ref 150–400)
RBC: 4.07 MIL/uL — ABNORMAL LOW (ref 4.22–5.81)
RDW: 14.6 % (ref 11.5–15.5)
WBC: 9.7 10*3/uL (ref 4.0–10.5)
nRBC: 0 % (ref 0.0–0.2)

## 2023-08-03 LAB — GLUCOSE, CAPILLARY
Glucose-Capillary: 89 mg/dL (ref 70–99)
Glucose-Capillary: 135 mg/dL — ABNORMAL HIGH (ref 70–99)
Glucose-Capillary: 99 mg/dL (ref 70–99)
Glucose-Capillary: 94 mg/dL (ref 70–99)
Glucose-Capillary: 102 mg/dL — ABNORMAL HIGH (ref 70–99)
Glucose-Capillary: 93 mg/dL (ref 70–99)

## 2023-08-03 LAB — PREPARE FRESH FROZEN PLASMA
Unit division: 0
Unit division: 0

## 2023-08-03 LAB — COOXEMETRY PANEL
Carboxyhemoglobin: 2 % — ABNORMAL HIGH (ref 0.5–1.5)
Methemoglobin: 0.8 % (ref 0.0–1.5)
O2 Saturation: 74.9 %
Total hemoglobin: 12.3 g/dL (ref 12.0–16.0)

## 2023-08-03 LAB — BPAM FFP
Blood Product Expiration Date: 202408152359
Blood Product Expiration Date: 202408152359
ISSUE DATE / TIME: 202408100635
ISSUE DATE / TIME: 202408101722
Unit Type and Rh: 8400
Unit Type and Rh: 8400

## 2023-08-03 LAB — PROTIME-INR
INR: 3.6 — ABNORMAL HIGH (ref 0.8–1.2)
Prothrombin Time: 36.1 s — ABNORMAL HIGH (ref 11.4–15.2)

## 2023-08-03 LAB — LACTATE DEHYDROGENASE: LDH: 348 U/L — ABNORMAL HIGH (ref 98–192)

## 2023-08-03 LAB — MAGNESIUM: Magnesium: 1.7 mg/dL (ref 1.7–2.4)

## 2023-08-03 LAB — PHOSPHORUS: Phosphorus: 2.7 mg/dL (ref 2.5–4.6)

## 2023-08-03 MED ORDER — POTASSIUM CHLORIDE CRYS ER 20 MEQ PO TBCR
20.0000 meq | EXTENDED_RELEASE_TABLET | ORAL | Status: DC
  Administered 2023-08-03: 20 meq via ORAL
  Filled 2023-08-03 (×2): qty 1

## 2023-08-03 MED ORDER — OXYCODONE HCL 5 MG PO TABS
5.0000 mg | ORAL_TABLET | ORAL | Status: DC | PRN
  Administered 2023-08-03 – 2023-08-04 (×3): 5 mg via ORAL
  Filled 2023-08-03 (×4): qty 1

## 2023-08-03 MED ORDER — POTASSIUM CHLORIDE CRYS ER 20 MEQ PO TBCR
40.0000 meq | EXTENDED_RELEASE_TABLET | Freq: Once | ORAL | Status: AC
  Administered 2023-08-03: 40 meq via ORAL

## 2023-08-03 MED ORDER — GABAPENTIN 300 MG PO CAPS
300.0000 mg | ORAL_CAPSULE | Freq: Three times a day (TID) | ORAL | Status: DC
  Administered 2023-08-03 – 2023-08-08 (×15): 300 mg via ORAL
  Filled 2023-08-03 (×15): qty 1

## 2023-08-03 MED ORDER — SORBITOL 70 % SOLN
30.0000 mL | Freq: Once | Status: AC
  Administered 2023-08-03: 30 mL via ORAL
  Filled 2023-08-03: qty 30

## 2023-08-03 MED ORDER — MAGNESIUM SULFATE 4 GM/100ML IV SOLN
4.0000 g | Freq: Once | INTRAVENOUS | Status: AC
  Administered 2023-08-03: 4 g via INTRAVENOUS
  Filled 2023-08-03: qty 100

## 2023-08-03 MED ORDER — POTASSIUM CHLORIDE 20 MEQ PO PACK: 20.0000 meq | PACK | ORAL | Status: DC

## 2023-08-03 MED ORDER — LORAZEPAM 0.5 MG PO TABS
0.5000 mg | ORAL_TABLET | Freq: Two times a day (BID) | ORAL | Status: DC | PRN
  Administered 2023-08-03 – 2023-08-07 (×6): 0.5 mg via ORAL
  Filled 2023-08-03 (×6): qty 1

## 2023-08-03 MED ORDER — METHYLNALTREXONE BROMIDE 12 MG/0.6ML ~~LOC~~ SOLN
12.0000 mg | Freq: Once | SUBCUTANEOUS | Status: AC
  Administered 2023-08-03: 12 mg via SUBCUTANEOUS
  Filled 2023-08-03: qty 0.6

## 2023-08-03 NOTE — Progress Notes (Signed)
Tanya RN notified plan to place PICC Monday.

## 2023-08-03 NOTE — Progress Notes (Addendum)
Advanced Heart Failure VAD Team Note  PCP-Cardiologist: None   Subjective:   8/7 S/P HMIII LVAD 8/8 Extubated.   Chest sore.   On milrinone 0.375. Co-ox 74% CTs out. MT remains  MAPs improved. Now 80-90. Getting PRN hydral. Diuresed well with IV lasix yesterday. CVP 6-7 (swan out)  No BM yet despite sorbitol.   INR 4.5 -> 3.6 after 2u FFP  LVAD INTERROGATION:  HeartMate III LVAD:   Flow 4.0  liters/min, speed 5600, power 4.0, PI 4.1   Objective:    Vital Signs:   Temp:  [97.7 F (36.5 C)-99.5 F (37.5 C)] 97.7 F (36.5 C) (08/11 0747) Pulse Rate:  [110-142] 122 (08/11 0942) Resp:  [18-22] 22 (08/10 2037) BP: (101-121)/(79-99) 108/88 (08/11 0942) SpO2:  [88 %-97 %] 96 % (08/11 0942) Arterial Line BP: (98-137)/(76-109) 104/86 (08/11 0942) Weight:  [69 kg-70.6 kg] 69 kg (08/11 0600) Last BM Date : 07/29/23 Mean arterial Pressure 95-105  Intake/Output:   Intake/Output Summary (Last 24 hours) at 08/03/2023 1107 Last data filed at 08/03/2023 1100 Gross per 24 hour  Intake 2002.94 ml  Output 5560 ml  Net -3557.06 ml     Physical Exam    General:  NAD.  HEENT: normal  Neck: supple. JVP not elevated.  Carotids 2+ bilat; no bruits. No lymphadenopathy or thryomegaly appreciated. Cor: sternal dressing ok LVAD hum.  Lungs: Clear. Abdomen: obese soft, nontender, + distended. No hepatosplenomegaly. No bruits or masses. Good bowel sounds. Driveline site clean. Anchor in place.  Extremities: no cyanosis, clubbing, rash. Warm no edema  Neuro: alert & oriented x 3. No focal deficits. Moves all 4 without problem   Telemetry   ST 110-120s Personally reviewed   Labs   Basic Metabolic Panel: Recent Labs  Lab 07/31/23 0428 07/31/23 0436 07/31/23 1652 07/31/23 1704 08/01/23 0426 08/01/23 1644 08/02/23 0336 08/03/23 0343 08/03/23 0445  NA 136   < > 136   < > 133* 133* 134* 136 136  K 4.5   < > 3.9   < > 3.9 4.1 3.7 3.5 3.5  CL 107  --  102  --  96*  --  95* 99   --   CO2 22  --  25  --  25  --  28 26  --   GLUCOSE 91  --  148*  --  121*  --  119* 95  --   BUN 16  --  16  --  10  --  6 8  --   CREATININE 1.09  --  1.16  --  1.07  --  1.03 0.91  --   CALCIUM 7.2*  --  7.4*  --  8.2*  --  8.4* 8.3*  --   MG 2.5*  --  2.2  --  2.1  --  1.7 1.7  --   PHOS 3.1  --   --   --  1.8*  --  1.5* 2.7  --    < > = values in this interval not displayed.    Liver Function Tests: Recent Labs  Lab 07/30/23 0519 07/31/23 0428 08/01/23 0426 08/02/23 0336 08/03/23 0343  AST 34 129* 121* 54* 32  ALT 47* 35 34 23 19  ALKPHOS 85 53 65 66 70  BILITOT 0.5 2.0* 1.8* 0.5 0.5  PROT 6.6 5.8* 5.9* 5.6* 6.2*  ALBUMIN 3.6 3.5 3.1* 2.7* 2.8*   No results for input(s): "LIPASE", "AMYLASE" in the last 168 hours. No results  for input(s): "AMMONIA" in the last 168 hours.  CBC: Recent Labs  Lab 07/29/23 0325 07/30/23 0519 07/30/23 0540 07/30/23 2000 07/31/23 0436 07/31/23 1652 07/31/23 1704 08/01/23 0426 08/01/23 1644 08/02/23 0336 08/03/23 0343 08/03/23 0445  WBC 5.9 6.5   < > 9.0  --  10.0  --  11.5*  --  11.5* 9.7  --   NEUTROABS 2.7 2.9  --   --   --   --   --  9.4*  --  8.6* 6.5  --   HGB 13.9 16.4   < > 11.8*  11.9*   < > 12.0*   < > 11.9* 13.3 12.5* 12.3* 12.2*  HCT 42.5 49.6   < > 36.0*  35.0*   < > 37.3*   < > 36.6* 39.0 39.1 36.7* 36.0*  MCV 88.9 88.4   < > 89.1  --  93.0  --  90.6  --  90.5 90.2  --   PLT 207 257   < > 168  --  169  --  170  --  206 248  --    < > = values in this interval not displayed.    INR: Recent Labs  Lab 07/31/23 0428 08/01/23 0426 08/02/23 0336 08/02/23 1206 08/03/23 0343  INR 1.4* 1.5* 4.2* 4.4* 3.6*    Other results:   Imaging   DG Chest Port 1 View  Result Date: 08/03/2023 CLINICAL DATA:  37 year old male status post left ventricular cyst device implantation. EXAM: PORTABLE CHEST 1 VIEW COMPARISON:  Multiple priors, most recently chest x-ray 08/02/2023. FINDINGS: Swan-Ganz catheter has been removed.  Left internal jugular Cordis in position with tip projecting over the innominate vein. Right internal jugular central venous catheter with tip projecting over the mid superior vena cava. Left ventricular assist device in position adjacent to the cardiac apex. Previously noted left-sided chest tube has been removed. Epicardial pacing wire incidentally noted. Lung volumes remain low. There continues to be extensive bibasilar opacities which may reflect areas of atelectasis and/or consolidation. Small right and moderate left pleural effusions appear similar. No appreciable pneumothorax. Pulmonary vasculature does not appear engorged. Heart size is mildly enlarged. Upper mediastinal contours are within normal limits. Status post median sternotomy. IMPRESSION: 1. Postoperative changes and support apparatus, as above. 2. Persistent bibasilar areas of atelectasis and/or consolidation with superimposed small right and moderate left pleural effusions, similar to the prior study. 3. Mild cardiomegaly. Electronically Signed   By: Trudie Reed M.D.   On: 08/03/2023 09:41   DG Chest Port 1 View  Result Date: 08/02/2023 CLINICAL DATA:  37 year old male status post implantation of left ventricular cyst device. EXAM: PORTABLE CHEST 1 VIEW COMPARISON:  Chest x-ray 08/01/2023. FINDINGS: Right internal jugular central venous catheter with tip terminating in the mid superior vena cava. Left internal jugular Cordis through which a Swan-Ganz catheter has been passed into the proximal right main pulmonary artery. Left-sided chest tube with tip projecting over the medial aspect of the mid left hemithorax. Left ventricular assist device projecting over the cardiac apex. Lung volumes are low. Extensive bibasilar opacities (left-greater-than-right) which may reflect areas of atelectasis and/or consolidation, with superimposed small right and small to moderate left pleural effusions. No appreciable pneumothorax confidently identified.  Pulmonary vasculature does not appear engorged. Heart size is mildly enlarged. The patient is rotated to the left on today's exam, resulting in distortion of the mediastinal contours and reduced diagnostic sensitivity and specificity for mediastinal pathology. Status post median sternotomy. IMPRESSION:  1. Postoperative changes and support apparatus, as above. 2. Persistent bibasilar opacities which may reflect areas of atelectasis and/or consolidation with superimposed small right and moderate left pleural effusions. 3. Cardiomegaly. Electronically Signed   By: Trudie Reed M.D.   On: 08/02/2023 08:22     Medications:     Scheduled Medications:  acetaminophen  1,000 mg Oral Q6H   bisacodyl  10 mg Oral Daily   buprenorphine-naloxone  1 tablet Sublingual Daily   Chlorhexidine Gluconate Cloth  6 each Topical Daily   digoxin  0.125 mg Oral Daily   docusate sodium  200 mg Oral Daily   feeding supplement  237 mL Oral TID WC   gabapentin  300 mg Oral BID   levalbuterol  1.25 mg Nebulization Q6H   lidocaine  2 patch Transdermal Q24H   loratadine  10 mg Oral QPM   melatonin  3 mg Oral QHS   metoCLOPramide (REGLAN) injection  10 mg Intravenous Q6H   mexiletine  200 mg Oral BID   mometasone-formoterol  2 puff Inhalation BID   montelukast  10 mg Oral q morning   pantoprazole  40 mg Oral Daily   polyethylene glycol  17 g Oral Daily   senna-docusate  2 tablet Oral QHS   simethicone  80 mg Oral QID   sodium chloride flush  10-40 mL Intracatheter Q12H   sodium chloride flush  3 mL Intravenous Q12H   Warfarin - Pharmacist Dosing Inpatient   Does not apply q1600    Infusions:  sodium chloride Stopped (07/31/23 0525)   sodium chloride Stopped (08/03/23 1057)   sodium chloride 10 mL/hr at 08/03/23 1100   lactated ringers     lactated ringers     lactated ringers 10 mL/hr at 08/03/23 1100   magnesium sulfate bolus IVPB 50 mL/hr at 08/03/23 1100   milrinone 0.375 mcg/kg/min (08/03/23 1100)     PRN Medications: sodium chloride, acetaminophen, cyclobenzaprine, fluticasone, hydrALAZINE, lactated ringers, LORazepam, morphine injection, nitroGLYCERIN, ondansetron (ZOFRAN) IV, oxyCODONE, traMADol   Patient Profile   Joel York is a 37 y.o. male with HTN, ADD, asthma, hx drug abuse (now on suboxone x5 yrs), and seizures. Admitted with acute systolic heart failure. NYHA IV -> shock.   S/P HMIII LVAD   Assessment/Plan:    1. S/P HMIII LVAD  on 8/6 - Outputs are good. Drop milrinone 0.375 -> 0.25 - Continue VAD speed at 5600 today. Will need ramp this week - MAPs improving - Pain remains an issue. Continue suboxone and gabapentin. Will increase gabapentin to 300 tid Need to limit narcotics - Continue to mobilize  2. Acute biventricular systolic heart failure >> cardiogenic shock - Admitted NYHA IV symptoms .Echo EF <20%,  RV mildly reduced, RV mod enlarged, estimated RV systolic pressure 53.37mmHg, LA mod dilated, RA mod dilated, mild-mod MR/TR - Etiology uncertain. ? 2/2 PVCs vs genetically mediated +/- hypertension.  - LHC with normal coronaries. RHC 07/24: Low CO (Fick CO/CI 3.3/1.9) and PAPi 3.0 on milrinone 0.375 . Off NE - cMRI:  LV markedly dilated EF 10% RV 17% NICM - Limited echo 07/29: LVEF < 20%, RV function improved. - Discussed with [facility] not a candidate for transplant with smoking.  - S/P HMIII as above.  - Decrease milrinone to 0.25. - Volume status ok - MAPs improved - Swan out. Place picc and remove introducer  3. Chest pressure - HsTrop 39>43>37>40, d/t demand ischemia - No CAD on cath   4. PVCs - snores, will need  eventual sleep study  - Suppressed with Mexiletine 200 mg BID - TSH 4.9, T4 0.79 - Keep K>4 and Mg >2   5. AKI - resolved.   6. Tobacco use - smokes 3-6 cigarettes / day - cessation discussed   7. Hx drug abuse - Continue suboxone - minimize narcotics  8. Post-op constipation - not responding to sorbitol - Add Relastor   I  reviewed the LVAD parameters from today, and compared the results to the patient's prior recorded data.  No programming changes were made.  The LVAD is functioning within specified parameters.  The patient performs LVAD self-test daily.  LVAD interrogation was negative for any significant power changes, alarms or PI events/speed drops.  LVAD equipment check completed and is in good working order.  Back-up equipment present.   LVAD education done on emergency procedures and precautions and reviewed exit site care.   Length of Stay: 20  [name], MD 08/03/2023, 11:07 AM  VAD Team --- VAD ISSUES ONLY--- Pager [phone] (7am - 7am)  Advanced Heart Failure Team  Pager [phone] (M-F; 7a - 5p)  Please contact [facility] for night-coverage after hours (5p -7a ) and weekends on [email or web address]

## 2023-08-03 NOTE — Plan of Care (Signed)
  Problem: Education: Goal: Knowledge of General Education information will improve Description: Including pain rating scale, medication(s)/side effects and non-pharmacologic comfort measures Outcome: Progressing   Problem: Health Behavior/Discharge Planning: Goal: Ability to manage health-related needs will improve Outcome: Progressing   Problem: Clinical Measurements: Goal: Ability to maintain clinical measurements within normal limits will improve Outcome: Progressing Goal: Will remain free from infection Outcome: Progressing Goal: Diagnostic test results will improve Outcome: Progressing Goal: Respiratory complications will improve Outcome: Progressing Goal: Cardiovascular complication will be avoided Outcome: Progressing   Problem: Activity: Goal: Risk for activity intolerance will decrease Outcome: Progressing   Problem: Nutrition: Goal: Adequate nutrition will be maintained Outcome: Progressing   Problem: Coping: Goal: Level of anxiety will decrease Outcome: Progressing   Problem: Pain Managment: Goal: General experience of comfort will improve Outcome: Progressing   Problem: Safety: Goal: Ability to remain free from injury will improve Outcome: Progressing   Problem: Skin Integrity: Goal: Risk for impaired skin integrity will decrease Outcome: Progressing   Problem: Education: Goal: Ability to demonstrate management of disease process will improve Outcome: Progressing Goal: Ability to verbalize understanding of medication therapies will improve Outcome: Progressing   Problem: Activity: Goal: Capacity to carry out activities will improve Outcome: Progressing   Problem: Cardiac: Goal: Ability to achieve and maintain adequate cardiopulmonary perfusion will improve Outcome: Progressing   Problem: Activity: Goal: Ability to return to baseline activity level will improve Outcome: Progressing   Problem: Cardiovascular: Goal: Ability to achieve and  maintain adequate cardiovascular perfusion will improve Outcome: Progressing Goal: Vascular access site(s) Level 0-1 will be maintained Outcome: Progressing   Problem: Health Behavior/Discharge Planning: Goal: Ability to safely manage health-related needs after discharge will improve Outcome: Progressing   Problem: Education: Goal: Knowledge of the prescribed therapeutic regimen will improve Outcome: Progressing   Problem: Activity: Goal: Risk for activity intolerance will decrease Outcome: Progressing   Problem: Cardiac: Goal: Ability to maintain an adequate cardiac output will improve Outcome: Progressing   Problem: Coping: Goal: Level of anxiety will decrease Outcome: Progressing   Problem: Fluid Volume: Goal: Risk for excess fluid volume will decrease Outcome: Progressing   Problem: Clinical Measurements: Goal: Ability to maintain clinical measurements within normal limits will improve Outcome: Progressing Goal: Will remain free from infection Outcome: Progressing   Problem: Respiratory: Goal: Will regain and/or maintain adequate ventilation Outcome: Progressing

## 2023-08-03 NOTE — Progress Notes (Signed)
ANTICOAGULATION CONSULT NOTE - Consult  Pharmacy Consult for warfarin Indication:  LVAD  Allergies  Allergen Reactions   Other Anaphylaxis    Tree Nuts   Peanuts [Peanut Oil] Anaphylaxis    Patient Measurements: Height: 5\' 7"  (170.2 cm) Weight: 69 kg (152 lb 1.9 oz) IBW/kg (Calculated) : 66.1   Vital Signs: Temp: 97.7 F (36.5 C) (08/11 0747) Temp Source: Oral (08/11 0747) BP: 121/87 (08/11 0400) Pulse Rate: 117 (08/11 0730)  Labs: Recent Labs    08/01/23 0426 08/01/23 1644 08/02/23 0336 08/02/23 1206 08/03/23 0343 08/03/23 0445  HGB 11.9*   < > 12.5*  --  12.3* 12.2*  HCT 36.6*   < > 39.1  --  36.7* 36.0*  PLT 170  --  206  --  248  --   LABPROT 18.6*  --  40.7* 42.4* 36.1*  --   INR 1.5*  --  4.2* 4.4* 3.6*  --   CREATININE 1.07  --  1.03  --  0.91  --    < > = values in this interval not displayed.    Estimated Creatinine Clearance: 104.9 mL/min (by C-G formula based on SCr of 0.91 mg/dL).   Medical History: Past Medical History:  Diagnosis Date   Acid reflux    ADHD (attention deficit hyperactivity disorder)    Asthma    Back pain    Seizures (HCC)    Resolved      Assessment: 37yom s/p LVAD started on warfarin post op  INR trended up quickly 1.4>4.4 (confirmed with recheck) with only 2 low doses warfarin no bleeding noted > down to 3.6 after 2 x FFP 8/10 - hold warfarin until INR closer to 2 then restart dosing  Tbili improved 1.8>0.5 ALT improved 100>wnl,LDH stable 300-400s fibrinogen stable 300 h/h stable 12/39 pltc stable 150-200  Ensure BID  Goal of Therapy:  INR goal 2-2.5 Monitor platelets by anticoagulation protocol: Yes   Plan:  No warfarin tonight Daily Protime cbc and hepatic function   Leota Sauers Pharm.D. CPP, BCPS Clinical Pharmacist [phone] 08/03/2023 9:10 AM

## 2023-08-03 NOTE — Progress Notes (Signed)
Drive Line: Existing VAD dressing removed and site care performed using sterile technique. Drive line exit site cleaned with Chlora prep applicators x 2, allowed to dry, and silver strip with gauze applied. Exit site healing  the velour is fully implanted at exit site .  Next VAD dressing 08/04/23

## 2023-08-04 ENCOUNTER — Inpatient Hospital Stay (HOSPITAL_COMMUNITY): Payer: Medicaid Other

## 2023-08-04 DIAGNOSIS — Z95811 Presence of heart assist device: Secondary | ICD-10-CM

## 2023-08-04 DIAGNOSIS — I5082 Biventricular heart failure: Secondary | ICD-10-CM | POA: Diagnosis not present

## 2023-08-04 DIAGNOSIS — Z452 Encounter for adjustment and management of vascular access device: Secondary | ICD-10-CM | POA: Diagnosis not present

## 2023-08-04 DIAGNOSIS — R918 Other nonspecific abnormal finding of lung field: Secondary | ICD-10-CM | POA: Diagnosis not present

## 2023-08-04 DIAGNOSIS — I517 Cardiomegaly: Secondary | ICD-10-CM | POA: Diagnosis not present

## 2023-08-04 DIAGNOSIS — I5021 Acute systolic (congestive) heart failure: Secondary | ICD-10-CM

## 2023-08-04 DIAGNOSIS — R570 Cardiogenic shock: Secondary | ICD-10-CM

## 2023-08-04 DIAGNOSIS — R0689 Other abnormalities of breathing: Secondary | ICD-10-CM | POA: Diagnosis not present

## 2023-08-04 LAB — GLUCOSE, CAPILLARY
Glucose-Capillary: 105 mg/dL — ABNORMAL HIGH (ref 70–99)
Glucose-Capillary: 103 mg/dL — ABNORMAL HIGH (ref 70–99)
Glucose-Capillary: 164 mg/dL — ABNORMAL HIGH (ref 70–99)

## 2023-08-04 MED ORDER — SODIUM CHLORIDE 0.9% FLUSH: 10.0000 mL | INTRAVENOUS | Status: DC | PRN

## 2023-08-04 MED ORDER — SORBITOL 70 % SOLN
960.0000 mL | TOPICAL_OIL | Freq: Once | ORAL | Status: DC
  Filled 2023-08-04: qty 240

## 2023-08-04 MED ORDER — LOSARTAN POTASSIUM 25 MG PO TABS
25.0000 mg | ORAL_TABLET | Freq: Every day | ORAL | Status: DC
  Administered 2023-08-04: 25 mg via ORAL
  Filled 2023-08-04: qty 1

## 2023-08-04 MED ORDER — OXYCODONE HCL 5 MG PO TABS
5.0000 mg | ORAL_TABLET | Freq: Four times a day (QID) | ORAL | Status: DC | PRN
  Administered 2023-08-04: 5 mg via ORAL
  Filled 2023-08-04: qty 1

## 2023-08-04 MED ORDER — LEVALBUTEROL HCL 1.25 MG/0.5ML IN NEBU: 1.2500 mg | INHALATION_SOLUTION | Freq: Four times a day (QID) | RESPIRATORY_TRACT | Status: DC | PRN

## 2023-08-04 MED ORDER — ASPIRIN 81 MG PO TBEC
81.0000 mg | DELAYED_RELEASE_TABLET | Freq: Every day | ORAL | Status: DC
  Administered 2023-08-04 – 2023-08-08 (×5): 81 mg via ORAL
  Filled 2023-08-04 (×5): qty 1

## 2023-08-04 MED ORDER — SORBITOL 70 % SOLN
30.0000 mL | Freq: Once | Status: AC
  Administered 2023-08-04: 30 mL via ORAL
  Filled 2023-08-04: qty 30

## 2023-08-04 MED ORDER — SODIUM CHLORIDE 0.9% FLUSH
10.0000 mL | Freq: Two times a day (BID) | INTRAVENOUS | Status: DC
  Administered 2023-08-04 – 2023-08-05 (×4): 10 mL
  Administered 2023-08-06: 20 mL
  Administered 2023-08-06 – 2023-08-08 (×4): 10 mL

## 2023-08-04 MED ORDER — LORAZEPAM 0.5 MG PO TABS
0.5000 mg | ORAL_TABLET | Freq: Once | ORAL | Status: AC
  Administered 2023-08-04: 0.5 mg via ORAL
  Filled 2023-08-04: qty 1

## 2023-08-04 NOTE — Progress Notes (Signed)
Patient ID: Joel York, male   DOB: [birth year], 37 y.o.   MRN: [id number] TCTS Evening Rounds:  Hemodynamically stable today. VAD parameters stable. Milrinone down to 0.125.  Ambulated well  Bowels moved.

## 2023-08-04 NOTE — Progress Notes (Signed)
LVAD Coordinator Rounding Note:  Admitted 07/14/23 from ED due to worsening HF symptoms.   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  Pt laying in bed. Complaining of sternal and rib pain. Gabapentin increased per Dr Gala Romney. Walked a lap around the unit this morning. Plans to work with PT this afternoon. Bedside RNs at bedside removing central line and foley.   Tolerating speed increase 5500. Plan for RAMP echo on Wednesday at 10:00.   Vital signs: Temp: 98.4 HR: 113 Doppler Pressure: 99 Auto BP: 112/74 (85) O2 Sat: 92% on RA Wt: 150.3>154.3>161.3>146.3 lbs     LVAD interrogation reveals:  Speed: 5500 Flow: 3.9 Power: 4.6 w PI: 5.6  Alarms: none Events: rare Hematocrit: 38  Fixed speed: 5500 Low speed limit: 5200  Drive Line: Existing VAD dressing removed and site care performed using sterile technique. Drive line exit site cleaned with Chlora prep applicators x 2, allowed to dry, and silver strip with gauze applied. Exit site healing and unincorporated, the velour is fully implanted at exit site. Scant amount of serosanguinous/dried bloody drainage. No redness, tenderness, foul odor or rash noted. 1 suture in place. Drive line anchor re-applied. Next dressing change due tomorrow 08/05/23 by VAD coordinator or nurse champion only.     Labs:  LDH trend:427>487>292  INR trend: 1.4>1.5>2.9  Anticoagulation Plan: -INR Goal: 2-2.5 -ASA Dose: 325  Blood Products:  Intra op: 2 FFP 450 Cell Saver  Arrythmias:   Infection:   Renal:  -BUN/CRT: 16/1.09>10/1.07>10/0.87  Gtts: Milrinone 0.125 mcg/kg/min   Patient Education: Pt demonstrated he is able to independently change power sources No family at bedside.   Adverse Events on VAD: -  Plan/Recommendations:  1. Continue daily dressing changes by VAD coordinator or Nurse Alla Feeling 2. Please page VAD coordinator for any alarms or VAD equipment issues.  Alyce Pagan RN VAD Coordinator   Office: [phone]  24/7 Pager: [phone]

## 2023-08-04 NOTE — Progress Notes (Signed)
CSW met with patient at bedside who reports "today is better than yesterday". He shared that the recovery has been tough so far but hopeful for continued improvement. CSW provided supportive intervention around recovery and adjustment to LVAD life. CSW will continue to follow for supportive intervention. Lasandra Beech, LCSW, CCSW-MCS [phone]

## 2023-08-04 NOTE — Progress Notes (Signed)
ANTICOAGULATION CONSULT NOTE - Consult  Pharmacy Consult for warfarin Indication:  LVAD  Allergies  Allergen Reactions   Other Anaphylaxis    Tree Nuts   Peanuts [Peanut Oil] Anaphylaxis    Patient Measurements: Height: 5\' 7"  (170.2 cm) Weight: 66.4 kg (146 lb 6.2 oz) IBW/kg (Calculated) : 66.1   Vital Signs: Temp: 98.4 F (36.9 C) (08/12 0733) Temp Source: Oral (08/12 0733) BP: 108/97 (08/12 0800) Pulse Rate: 126 (08/12 0800)  Labs: Recent Labs    08/02/23 0336 08/02/23 1206 08/03/23 0343 08/03/23 0445 08/04/23 0343  HGB 12.5*  --  12.3* 12.2* 12.1*  HCT 39.1  --  36.7* 36.0* 38.2*  PLT 206  --  248  --  282  LABPROT 40.7* 42.4* 36.1*  --  30.3*  INR 4.2* 4.4* 3.6*  --  2.9*  CREATININE 1.03  --  0.91  --  0.87    Estimated Creatinine Clearance: 109.7 mL/min (by C-G formula based on SCr of 0.87 mg/dL).   Medical History: Past Medical History:  Diagnosis Date   Acid reflux    ADHD (attention deficit hyperactivity disorder)    Asthma    Back pain    Seizures (HCC)    Resolved     Assessment: 37yom s/p LVAD started on warfarin post op. No AC PTA.   INR went up to 4.4 after 2 doses - received 2 FFP and now INR trending downward appropriately. Hgb 12.1, plt 282. LDH 292. LFTs WNL, total bilirubin now WNL.   Goal of Therapy:  INR goal 2-2.5 Monitor platelets by anticoagulation protocol: Yes   Plan:  Hold warfarin tonight Daily INR, CBC and hepatic function  Thank you for allowing pharmacy to participate in this patient's care,  Sherron Monday, PharmD, BCCCP Clinical Pharmacist  Phone: [phone] 08/04/2023 12:16 PM  Please check AMION for all [facility] Pharmacy phone numbers After 10:00 PM, call Main Pharmacy [phone]

## 2023-08-04 NOTE — Progress Notes (Signed)
HeartMate 3 Rounding Note Postop day #5 Subjective:   37 year old male with nonischemic cardiomyopathy presented with cardiogenic shock from severe LV dysfunction and moderate to severe RV dysfunction.  After full evaluation and counseling he underwent implantation of HeartMate 3 July 30, 2023  Patient states surgical pain is still high Suboxone has been resumed, titrating oral pain meds Patient able to ambulate in the hallway. Will DC Foley catheter and the left IJ central line today. Patient complains of VAD pocket pain and constipation. Mean arterial blood pressures have been high treated with as needed Apresoline.  VAD parameters are satisfactory, flows slightly lower with elevated MAP, speed up to 5600 rpm    INR  1.5>>4.4>>4.5>>2.9 Coumadin per Pharm.D.  All chest drains have been removed. Temporary V PW's will be removed prior to discharge.  LVAD INTERROGATION:  HeartMate II LVAD:  Flow 4.2  liters/min, speed 5600, power 3.5, PI 3.7.  Controller intact  Objective:    Vital Signs:   Temp:  [97.9 F (36.6 C)-98.8 F (37.1 C)] 98.4 F (36.9 C) (08/12 0733) Pulse Rate:  [108-166] 126 (08/12 0800) Resp:  [19-20] 20 (08/12 0800) BP: (106-128)/(70-102) 108/97 (08/12 0800) SpO2:  [91 %-98 %] 93 % (08/12 0800) Arterial Line BP: (97-127)/(80-109) 104/97 (08/11 1530) Weight:  [66.4 kg] 66.4 kg (08/12 0500) Last BM Date : 07/29/23 Mean arterial Pressure 70 mm Hg  Intake/Output:   Intake/Output Summary (Last 24 hours) at 08/04/2023 0927 Last data filed at 08/04/2023 0800 Gross per 24 hour  Intake 543.35 ml  Output 2205 ml  Net -1661.65 ml     Physical Exam: General:  Well appearing. No resp difficulty.  Complains of left chest pocket pain. HEENT: normal Neck: supple. JVP . Carotids 2+ bilat; no bruits. No lymphadenopathy or thryomegaly appreciated. Cor: Mechanical heart sounds with LVAD hum present. Lungs: clear Abdomen: soft, nontender, nondistended. No  hepatosplenomegaly. No bruits or masses.  Diminished bowel sounds. Extremities: no cyanosis, clubbing, rash, edema Neuro: alert & orientedx3, cranial nerves grossly intact. moves all 4 extremities w/o difficulty.  Okay pleasant  Telemetry: Sinus tach 110-120  Labs: Basic Metabolic Panel: Recent Labs  Lab 07/31/23 0428 07/31/23 0436 07/31/23 1652 07/31/23 1704 08/01/23 0426 08/01/23 1644 08/02/23 0336 08/03/23 0343 08/03/23 0445 08/04/23 0343  NA 136   < > 136   < > 133* 133* 134* 136 136 133*  K 4.5   < > 3.9   < > 3.9 4.1 3.7 3.5 3.5 4.1  CL 107  --  102  --  96*  --  95* 99  --  99  CO2 22  --  25  --  25  --  28 26  --  25  GLUCOSE 91  --  148*  --  121*  --  119* 95  --  101*  BUN 16  --  16  --  10  --  6 8  --  10  CREATININE 1.09  --  1.16  --  1.07  --  1.03 0.91  --  0.87  CALCIUM 7.2*  --  7.4*  --  8.2*  --  8.4* 8.3*  --  8.2*  MG 2.5*  --  2.2  --  2.1  --  1.7 1.7  --  2.0  PHOS 3.1  --   --   --  1.8*  --  1.5* 2.7  --  3.2   < > = values in this interval not displayed.  Liver Function Tests: Recent Labs  Lab 07/31/23 0428 08/01/23 0426 08/02/23 0336 08/03/23 0343 08/04/23 0343  AST 129* 121* 54* 32 22  ALT 35 34 23 19 16   ALKPHOS 53 65 66 70 70  BILITOT 2.0* 1.8* 0.5 0.5 0.4  PROT 5.8* 5.9* 5.6* 6.2* 6.2*  ALBUMIN 3.5 3.1* 2.7* 2.8* 2.8*   No results for input(s): "LIPASE", "AMYLASE" in the last 168 hours. No results for input(s): "AMMONIA" in the last 168 hours.  CBC: Recent Labs  Lab 07/30/23 0519 07/30/23 0540 07/31/23 1652 07/31/23 1704 08/01/23 0426 08/01/23 1644 08/02/23 0336 08/03/23 0343 08/03/23 0445 08/04/23 0343  WBC 6.5   < > 10.0  --  11.5*  --  11.5* 9.7  --  10.3  NEUTROABS 2.9  --   --   --  9.4*  --  8.6* 6.5  --  7.0  HGB 16.4   < > 12.0*   < > 11.9* 13.3 12.5* 12.3* 12.2* 12.1*  HCT 49.6   < > 37.3*   < > 36.6* 39.0 39.1 36.7* 36.0* 38.2*  MCV 88.4   < > 93.0  --  90.6  --  90.5 90.2  --  89.7  PLT 257   < > 169   --  170  --  206 248  --  282   < > = values in this interval not displayed.    INR: Recent Labs  Lab 08/01/23 0426 08/02/23 0336 08/02/23 1206 08/03/23 0343 08/04/23 0343  INR 1.5* 4.2* 4.4* 3.6* 2.9*    Other results: EKG:   Imaging: A.m. chest x-ray personally reviewed.   Medications:     Scheduled Medications:  acetaminophen  1,000 mg Oral Q6H   aspirin EC  81 mg Oral Daily   bisacodyl  10 mg Oral Daily   buprenorphine-naloxone  1 tablet Sublingual Daily   Chlorhexidine Gluconate Cloth  6 each Topical Daily   digoxin  0.125 mg Oral Daily   docusate sodium  200 mg Oral Daily   feeding supplement  237 mL Oral TID WC   gabapentin  300 mg Oral TID   levalbuterol  1.25 mg Nebulization Q6H   lidocaine  2 patch Transdermal Q24H   loratadine  10 mg Oral QPM   melatonin  3 mg Oral QHS   metoCLOPramide (REGLAN) injection  10 mg Intravenous Q6H   mexiletine  200 mg Oral BID   mometasone-formoterol  2 puff Inhalation BID   montelukast  10 mg Oral q morning   pantoprazole  40 mg Oral Daily   polyethylene glycol  17 g Oral Daily   senna-docusate  2 tablet Oral QHS   simethicone  80 mg Oral QID   sodium chloride flush  10-40 mL Intracatheter Q12H   sodium chloride flush  3 mL Intravenous Q12H   Warfarin - Pharmacist Dosing Inpatient   Does not apply q1600    Infusions:  sodium chloride Stopped (07/31/23 0525)   sodium chloride Stopped (08/03/23 1743)   sodium chloride 10 mL/hr at 08/04/23 0800   lactated ringers     lactated ringers     lactated ringers Stopped (08/03/23 1735)   milrinone 0.25 mcg/kg/min (08/04/23 0800)    PRN Medications: sodium chloride, acetaminophen, cyclobenzaprine, fluticasone, hydrALAZINE, lactated ringers, LORazepam, nitroGLYCERIN, ondansetron (ZOFRAN) IV, oxyCODONE, traMADol   Assessment:  Status post HeartMate 3 implantation July 30, 2023 for nonischemic cardiomyopathy with biventricular dysfunction  Past medical history positive  for polycythemia, asthma, and chronic  Suboxone treatment for opioid abuse    Plan/Discussion:    Pain control remains a prominent issue and he will have the Suboxone resumed. Using po short acting opiates  drain  Postoperative Coumadin loading has been initiated and will be covered by Pharm.D.  PICC line will be placed today and surgical central lines will be removed.  Foley catheter out today.  Chest x-ray shows probable left mild effusion/atelectasis    I reviewed the LVAD parameters from today, and compared the results to the patient's prior recorded data.  No programming changes were made.  The LVAD is functioning within specified parameters.  The patient performs LVAD self-test daily.  LVAD interrogation was negative for any significant power changes, alarms or PI events/speed drops.  LVAD equipment check completed and is in good working order.  Back-up equipment present.   LVAD education done on emergency procedures and precautions and reviewed exit site care.  Length of Stay: [address]  Lovett Sox 08/04/2023, 9:27 AM

## 2023-08-04 NOTE — Plan of Care (Signed)
  Problem: Education: Goal: Knowledge of General Education information will improve Description: Including pain rating scale, medication(s)/side effects and non-pharmacologic comfort measures Outcome: Progressing   Problem: Health Behavior/Discharge Planning: Goal: Ability to manage health-related needs will improve Outcome: Progressing   Problem: Clinical Measurements: Goal: Ability to maintain clinical measurements within normal limits will improve Outcome: Progressing Goal: Will remain free from infection Outcome: Progressing Goal: Diagnostic test results will improve Outcome: Progressing Goal: Respiratory complications will improve Outcome: Progressing Goal: Cardiovascular complication will be avoided Outcome: Progressing   Problem: Activity: Goal: Risk for activity intolerance will decrease Outcome: Progressing   Problem: Nutrition: Goal: Adequate nutrition will be maintained Outcome: Progressing   Problem: Coping: Goal: Level of anxiety will decrease Outcome: Progressing   Problem: Pain Managment: Goal: General experience of comfort will improve Outcome: Progressing   Problem: Safety: Goal: Ability to remain free from injury will improve Outcome: Progressing   Problem: Skin Integrity: Goal: Risk for impaired skin integrity will decrease Outcome: Progressing   Problem: Education: Goal: Ability to demonstrate management of disease process will improve Outcome: Progressing Goal: Ability to verbalize understanding of medication therapies will improve Outcome: Progressing   Problem: Activity: Goal: Capacity to carry out activities will improve Outcome: Progressing   Problem: Cardiac: Goal: Ability to achieve and maintain adequate cardiopulmonary perfusion will improve Outcome: Progressing   Problem: Activity: Goal: Ability to return to baseline activity level will improve Outcome: Progressing   Problem: Cardiovascular: Goal: Ability to achieve and  maintain adequate cardiovascular perfusion will improve Outcome: Progressing Goal: Vascular access site(s) Level 0-1 will be maintained Outcome: Progressing   Problem: Health Behavior/Discharge Planning: Goal: Ability to safely manage health-related needs after discharge will improve Outcome: Progressing   Problem: Education: Goal: Knowledge of the prescribed therapeutic regimen will improve Outcome: Progressing   Problem: Activity: Goal: Risk for activity intolerance will decrease Outcome: Progressing   Problem: Cardiac: Goal: Ability to maintain an adequate cardiac output will improve Outcome: Progressing   Problem: Coping: Goal: Level of anxiety will decrease Outcome: Progressing   Problem: Fluid Volume: Goal: Risk for excess fluid volume will decrease Outcome: Progressing   Problem: Clinical Measurements: Goal: Ability to maintain clinical measurements within normal limits will improve Outcome: Progressing Goal: Will remain free from infection Outcome: Progressing   Problem: Respiratory: Goal: Will regain and/or maintain adequate ventilation Outcome: Progressing

## 2023-08-04 NOTE — Progress Notes (Signed)
Advanced Heart Failure VAD Team Note  PCP-Cardiologist: None   Subjective:   8/7 S/P HMIII LVAD 8/8 Extubated.   CO-OX 70% on milrinone 0.25.  MAP 100s  INR 4.5 -> 2.9 after 2u FFP  Chest remains sore but improving. Able to ambulate hall yesterday/   LVAD INTERROGATION:  HeartMate III LVAD:   Flow 4.0  liters/min, speed 5600, power 4, PI 4.4.  Objective:    Vital Signs:   Temp:  [97.9 F (36.6 C)-98.8 F (37.1 C)] 98.4 F (36.9 C) (08/12 0733) Pulse Rate:  [108-166] 126 (08/12 0800) Resp:  [19-20] 20 (08/12 0800) BP: (106-128)/(70-102) 108/97 (08/12 0800) SpO2:  [91 %-98 %] 93 % (08/12 0800) Arterial Line BP: (97-127)/(80-109) 104/97 (08/11 1530) Weight:  [66.4 kg] 66.4 kg (08/12 0500) Last BM Date : 07/29/23 Mean arterial Pressure 90s-100s  Intake/Output:   Intake/Output Summary (Last 24 hours) at 08/04/2023 0850 Last data filed at 08/04/2023 0800 Gross per 24 hour  Intake 645.99 ml  Output 2205 ml  Net -1559.01 ml     Physical Exam    General:  Sitting up in bed HEENT: normal  Neck: supple. JVP not elevated.  Carotids 2+ bilat; no bruits. No lymphadenopathy or thryomegaly appreciated. Cor: sternal dressing ok LVAD hum.  Lungs: Clear. Abdomen: soft, nontender, non-distended. No hepatosplenomegaly. No bruits or masses. Good bowel sounds. Driveline site clean. Anchor in place.  Extremities: no cyanosis, clubbing, rash. Warm no edema  Neuro: alert & oriented x 3. No focal deficits. Moves all 4 without problem     Telemetry   ST 110s-120s  Labs   Basic Metabolic Panel: Recent Labs  Lab 07/31/23 0428 07/31/23 0436 07/31/23 1652 07/31/23 1704 08/01/23 0426 08/01/23 1644 08/02/23 0336 08/03/23 0343 08/03/23 0445 08/04/23 0343  NA 136   < > 136   < > 133* 133* 134* 136 136 133*  K 4.5   < > 3.9   < > 3.9 4.1 3.7 3.5 3.5 4.1  CL 107  --  102  --  96*  --  95* 99  --  99  CO2 22  --  25  --  25  --  28 26  --  25  GLUCOSE 91  --  148*  --   121*  --  119* 95  --  101*  BUN 16  --  16  --  10  --  6 8  --  10  CREATININE 1.09  --  1.16  --  1.07  --  1.03 0.91  --  0.87  CALCIUM 7.2*  --  7.4*  --  8.2*  --  8.4* 8.3*  --  8.2*  MG 2.5*  --  2.2  --  2.1  --  1.7 1.7  --  2.0  PHOS 3.1  --   --   --  1.8*  --  1.5* 2.7  --  3.2   < > = values in this interval not displayed.    Liver Function Tests: Recent Labs  Lab 07/31/23 0428 08/01/23 0426 08/02/23 0336 08/03/23 0343 08/04/23 0343  AST 129* 121* 54* 32 22  ALT 35 34 23 19 16   ALKPHOS 53 65 66 70 70  BILITOT 2.0* 1.8* 0.5 0.5 0.4  PROT 5.8* 5.9* 5.6* 6.2* 6.2*  ALBUMIN 3.5 3.1* 2.7* 2.8* 2.8*   No results for input(s): "LIPASE", "AMYLASE" in the last 168 hours. No results for input(s): "AMMONIA" in the last 168 hours.  CBC: Recent Labs  Lab 07/30/23 0519 07/30/23 0540 07/31/23 1652 07/31/23 1704 08/01/23 0426 08/01/23 1644 08/02/23 0336 08/03/23 0343 08/03/23 0445 08/04/23 0343  WBC 6.5   < > 10.0  --  11.5*  --  11.5* 9.7  --  10.3  NEUTROABS 2.9  --   --   --  9.4*  --  8.6* 6.5  --  7.0  HGB 16.4   < > 12.0*   < > 11.9* 13.3 12.5* 12.3* 12.2* 12.1*  HCT 49.6   < > 37.3*   < > 36.6* 39.0 39.1 36.7* 36.0* 38.2*  MCV 88.4   < > 93.0  --  90.6  --  90.5 90.2  --  89.7  PLT 257   < > 169  --  170  --  206 248  --  282   < > = values in this interval not displayed.    INR: Recent Labs  Lab 08/01/23 0426 08/02/23 0336 08/02/23 1206 08/03/23 0343 08/04/23 0343  INR 1.5* 4.2* 4.4* 3.6* 2.9*    Other results:   Imaging   [place] EKG SITE RITE  Result Date: 08/03/2023 If Site Rite image not attached, placement could not be confirmed due to current cardiac rhythm.  DG Chest Port 1 View  Result Date: 08/03/2023 CLINICAL DATA:  37 year old male status post left ventricular cyst device implantation. EXAM: PORTABLE CHEST 1 VIEW COMPARISON:  Multiple priors, most recently chest x-ray 08/02/2023. FINDINGS: Swan-Ganz catheter has been removed. Left  internal jugular Cordis in position with tip projecting over the innominate vein. Right internal jugular central venous catheter with tip projecting over the mid superior vena cava. Left ventricular assist device in position adjacent to the cardiac apex. Previously noted left-sided chest tube has been removed. Epicardial pacing wire incidentally noted. Lung volumes remain low. There continues to be extensive bibasilar opacities which may reflect areas of atelectasis and/or consolidation. Small right and moderate left pleural effusions appear similar. No appreciable pneumothorax. Pulmonary vasculature does not appear engorged. Heart size is mildly enlarged. Upper mediastinal contours are within normal limits. Status post median sternotomy. IMPRESSION: 1. Postoperative changes and support apparatus, as above. 2. Persistent bibasilar areas of atelectasis and/or consolidation with superimposed small right and moderate left pleural effusions, similar to the prior study. 3. Mild cardiomegaly. Electronically Signed   By: Trudie Reed M.D.   On: 08/03/2023 09:41     Medications:     Scheduled Medications:  acetaminophen  1,000 mg Oral Q6H   aspirin EC  81 mg Oral Daily   bisacodyl  10 mg Oral Daily   buprenorphine-naloxone  1 tablet Sublingual Daily   Chlorhexidine Gluconate Cloth  6 each Topical Daily   digoxin  0.125 mg Oral Daily   docusate sodium  200 mg Oral Daily   feeding supplement  237 mL Oral TID WC   gabapentin  300 mg Oral TID   levalbuterol  1.25 mg Nebulization Q6H   lidocaine  2 patch Transdermal Q24H   loratadine  10 mg Oral QPM   melatonin  3 mg Oral QHS   metoCLOPramide (REGLAN) injection  10 mg Intravenous Q6H   mexiletine  200 mg Oral BID   mometasone-formoterol  2 puff Inhalation BID   montelukast  10 mg Oral q morning   pantoprazole  40 mg Oral Daily   polyethylene glycol  17 g Oral Daily   senna-docusate  2 tablet Oral QHS   simethicone  80 mg Oral QID  sodium chloride  flush  10-40 mL Intracatheter Q12H   sodium chloride flush  3 mL Intravenous Q12H   Warfarin - Pharmacist Dosing Inpatient   Does not apply q1600    Infusions:  sodium chloride Stopped (07/31/23 0525)   sodium chloride Stopped (08/03/23 1743)   sodium chloride 10 mL/hr at 08/04/23 0800   lactated ringers     lactated ringers     lactated ringers Stopped (08/03/23 1735)   milrinone 0.25 mcg/kg/min (08/04/23 0800)    PRN Medications: sodium chloride, acetaminophen, cyclobenzaprine, fluticasone, hydrALAZINE, lactated ringers, LORazepam, nitroGLYCERIN, ondansetron (ZOFRAN) IV, oxyCODONE, traMADol   Patient Profile   Joel York is a 37 y.o. male with HTN, ADD, asthma, hx drug abuse (now on suboxone x5 yrs), and seizures. Admitted with acute systolic heart failure. NYHA IV -> shock.   S/P HMIII LVAD   Assessment/Plan:    1. S/P HMIII LVAD  on 8/6 - Outputs are good. Drop milrinone 0.375 -> 0.25 - Continue VAD speed at 5600 today. Will need ramp this week -> will schedule for Wednesday - MAPs 95-105. Add losartan 25.  - Pain remains an issue. Continue suboxone and gabapentin. Continue gabapentin to 300 tid Will space out oxy dosing. Can use tramadol as needed - Continue to mobilize  2. Acute biventricular systolic heart failure >> cardiogenic shock - Admitted NYHA IV symptoms .Echo EF <20%,  RV mildly reduced, RV mod enlarged, estimated RV systolic pressure 53.49mmHg, LA mod dilated, RA mod dilated, mild-mod MR/TR - Etiology uncertain. ? 2/2 PVCs vs genetically mediated +/- hypertension.  - LHC with normal coronaries. RHC 07/24: Low CO (Fick CO/CI 3.3/1.9) and PAPi 3.0 on milrinone 0.375 . Off NE - cMRI:  LV markedly dilated EF 10% RV 17% NICM - Limited echo 07/29: LVEF < 20%, RV function improved. - Discussed with [facility] not a candidate for transplant with smoking.  - S/P HMIII as above.  - Wean milrinone to 0.125 - Add losartan - Volume status ok - MAPs remain elevated. Start  losartan 25 mg daily.  3. Chest pressure - HsTrop 39>43>37>40, d/t demand ischemia - No CAD on cath - No s/s angina   4. PVCs - snores, will need eventual sleep study  - Suppressed with Mexiletine 200 mg BID - TSH 4.9, T4 0.79 - Keep K>4 and Mg >2   5. AKI - resolved.   6. Tobacco use - smokes 3-6 cigarettes / day - cessation discussed   7. Hx drug abuse - Continue suboxone - minimize narcotics  8. Post-op constipation - not responding to sorbitol or Relastor - Repeat sorbitol. Mobilize  Length of Stay: 44  [name], MD  9:33 AM  VAD Team --- VAD ISSUES ONLY--- Pager [phone] (7am - 7am)  Advanced Heart Failure Team  Pager [phone] (M-F; 7a - 5p)  Please contact [facility] for night-coverage after hours (5p -7a ) and weekends on [email or web address]

## 2023-08-04 NOTE — Progress Notes (Signed)
Peripherally Inserted Central Catheter Placement  The IV Nurse has discussed with the patient and/or persons authorized to consent for the patient, the purpose of this procedure and the potential benefits and risks involved with this procedure.  The benefits include less needle sticks, lab draws from the catheter, and the patient may be discharged home with the catheter. Risks include, but not limited to, infection, bleeding, blood clot (thrombus formation), and puncture of an artery; nerve damage and irregular heartbeat and possibility to perform a PICC exchange if needed/ordered by physician.  Alternatives to this procedure were also discussed.  Bard Power PICC patient education guide, fact sheet on infection prevention and patient information card has been provided to patient /or left at bedside.    PICC Placement Documentation  PICC Double Lumen 08/04/23 Right Basilic 40 cm 0 cm (Active)  Indication for Insertion or Continuance of Line Prolonged intravenous therapies 08/04/23 0952  Exposed Catheter (cm) 0 cm 08/04/23 0952  Site Assessment Clean, Dry, Intact 08/04/23 0952  Lumen #1 Status Flushed;Blood return noted;Saline locked 08/04/23 0952  Lumen #2 Status Flushed;Saline locked;Blood return noted 08/04/23 0952  Dressing Type Transparent;Securing device 08/04/23 4098  Dressing Status Antimicrobial disc in place 08/04/23 1191  Line Adjustment (NICU/IV Team Only) No 08/04/23 0952  Dressing Change Due 08/11/23 08/04/23 0952       Romie Jumper 08/04/2023, 9:56 AM

## 2023-08-04 NOTE — Progress Notes (Signed)
Physical Therapy Treatment Patient Details Name: Joel York MRN: [id number] DOB: [birth year] Today's Date: 08/04/2023   History of Present Illness Pt is 37 year old presented to [facility] on  07/14/23 for BLE swelling and SOB. Pt with acute biventricular systolic heart failure.  Underwent placement of LVAD on 07/30/23. 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    PT Comments  Pt tolerates treatment well, ambulating for limited community distances with increased time. PT notes tachypnea as pt reports soreness with deep breathing. PT continues to encourage incentive spirometer use, as well as a progressive increase in ambulation distance and activity tolerance. Pt will continue to benefit from PT services in an effort to restore independence.     If plan is discharge home, recommend the following: Assist for transportation;Assistance with cooking/housework;Help with stairs or ramp for entrance   Can travel by private vehicle        Equipment Recommendations  None recommended by PT    Recommendations for Other Services       Precautions / Restrictions Precautions Precautions: Other (comment) Precaution Comments: LVAD Restrictions Weight Bearing Restrictions: No Other Position/Activity Restrictions: sternal precautions     Mobility  Bed Mobility Overal bed mobility: Modified Independent Bed Mobility: Supine to Sit, Sit to Supine     Supine to sit: Modified independent (Device/Increase time) Sit to supine: Modified independent (Device/Increase time)   General bed mobility comments: increased time    Transfers Overall transfer level: Modified independent Equipment used: None Transfers: Sit to/from Stand Sit to Stand: Modified independent (Device/Increase time)           General transfer comment: increased time    Ambulation/Gait Ambulation/Gait assistance: Independent Gait Distance (Feet): 600 Feet Assistive device: None Gait  Pattern/deviations: Step-through pattern Gait velocity: functional Gait velocity interpretation: 1.31 - 2.62 ft/sec, indicative of limited community ambulator   General Gait Details: slowed step-through gait   Stairs             Wheelchair Mobility     Tilt Bed    Modified Rankin (Stroke Patients Only)       Balance Overall balance assessment: No apparent balance deficits (not formally assessed)                                          Cognition Arousal: Alert Behavior During Therapy: WFL for tasks assessed/performed Overall Cognitive Status: Within Functional Limits for tasks assessed                                          Exercises      General Comments General comments (skin integrity, edema, etc.): HR 120 at rest, 137 with activity. PT also notes tachypnea, encourages deeper breaths and continued use of incentive spirometer      Pertinent Vitals/Pain Pain Assessment Pain Assessment: 0-10 Pain Score: 5  Pain Location: chest Pain Descriptors / Indicators: Sore Pain Intervention(s): RN gave pain meds during session    Home Living                          Prior Function            PT Goals (current goals can now be found in the  care plan section) Acute Rehab PT Goals Patient Stated Goal: to walk without pain Progress towards PT goals: Progressing toward goals    Frequency    Min 1X/week      PT Plan      Co-evaluation              AM-PAC PT "6 Clicks" Mobility   Outcome Measure  Help needed turning from your back to your side while in a flat bed without using bedrails?: None Help needed moving from lying on your back to sitting on the side of a flat bed without using bedrails?: None Help needed moving to and from a bed to a chair (including a wheelchair)?: None Help needed standing up from a chair using your arms (e.g., wheelchair or bedside chair)?: None Help needed to walk in  hospital room?: None Help needed climbing 3-5 steps with a railing? : A Little 6 Click Score: 23    End of Session   Activity Tolerance: Patient tolerated treatment well Patient left: in bed;with call bell/phone within reach Nurse Communication: Mobility status PT Visit Diagnosis: Difficulty in walking, not elsewhere classified (R26.2);Pain Pain - part of body:  (chest)     Time: 9604-5409 PT Time Calculation (min) (ACUTE ONLY): 33 min  Charges:    $Gait Training: 8-22 mins $Therapeutic Activity: 8-22 mins PT General Charges $$ ACUTE PT VISIT: 1 Visit                     Arlyss Gandy, PT, DPT Acute Rehabilitation Office [phone]    Arlyss Gandy 08/04/2023, 2:45 PM

## 2023-08-04 NOTE — Progress Notes (Signed)
This chaplain is present for F/U spiritual care in between Pt. procedures. The Pt. interaction with the chaplain is optimistic and limited to a few words   This chaplain is available for F/U spiritual care as needed.  Chaplain Stephanie Acre [phone]

## 2023-08-05 ENCOUNTER — Inpatient Hospital Stay (HOSPITAL_COMMUNITY): Payer: Medicaid Other

## 2023-08-05 ENCOUNTER — Encounter (HOSPITAL_COMMUNITY): Payer: Self-pay | Admitting: Cardiothoracic Surgery

## 2023-08-05 DIAGNOSIS — I517 Cardiomegaly: Secondary | ICD-10-CM | POA: Diagnosis not present

## 2023-08-05 DIAGNOSIS — R570 Cardiogenic shock: Secondary | ICD-10-CM | POA: Diagnosis not present

## 2023-08-05 DIAGNOSIS — Z95811 Presence of heart assist device: Secondary | ICD-10-CM | POA: Diagnosis not present

## 2023-08-05 DIAGNOSIS — R918 Other nonspecific abnormal finding of lung field: Secondary | ICD-10-CM | POA: Diagnosis not present

## 2023-08-05 DIAGNOSIS — I5082 Biventricular heart failure: Secondary | ICD-10-CM | POA: Diagnosis not present

## 2023-08-05 DIAGNOSIS — Z452 Encounter for adjustment and management of vascular access device: Secondary | ICD-10-CM | POA: Diagnosis not present

## 2023-08-05 DIAGNOSIS — I5021 Acute systolic (congestive) heart failure: Secondary | ICD-10-CM | POA: Diagnosis not present

## 2023-08-05 LAB — COOXEMETRY PANEL
Carboxyhemoglobin: 2.8 % — ABNORMAL HIGH (ref 0.5–1.5)
Methemoglobin: 0.8 % (ref 0.0–1.5)
O2 Saturation: 85.7 %
Total hemoglobin: 13.6 g/dL (ref 12.0–16.0)

## 2023-08-05 MED ORDER — WARFARIN 0.5 MG HALF TABLET
0.5000 mg | ORAL_TABLET | Freq: Once | ORAL | Status: AC
  Administered 2023-08-05: 0.5 mg via ORAL
  Filled 2023-08-05: qty 1

## 2023-08-05 MED ORDER — TRAZODONE HCL 50 MG PO TABS
50.0000 mg | ORAL_TABLET | Freq: Every evening | ORAL | Status: DC | PRN
  Administered 2023-08-06 – 2023-08-07 (×2): 50 mg via ORAL
  Filled 2023-08-05 (×2): qty 1

## 2023-08-05 MED ORDER — ORAL CARE MOUTH RINSE: 15.0000 mL | OROMUCOSAL | Status: DC | PRN

## 2023-08-05 MED ORDER — LOSARTAN POTASSIUM 25 MG PO TABS
25.0000 mg | ORAL_TABLET | Freq: Two times a day (BID) | ORAL | Status: DC
  Administered 2023-08-05 – 2023-08-08 (×7): 25 mg via ORAL
  Filled 2023-08-05 (×7): qty 1

## 2023-08-05 MED ORDER — SERTRALINE HCL 25 MG PO TABS
25.0000 mg | ORAL_TABLET | Freq: Every day | ORAL | Status: DC
  Administered 2023-08-05 – 2023-08-08 (×4): 25 mg via ORAL
  Filled 2023-08-05 (×4): qty 1

## 2023-08-05 MED ORDER — OXYCODONE HCL 5 MG PO TABS
5.0000 mg | ORAL_TABLET | Freq: Three times a day (TID) | ORAL | Status: DC | PRN
  Administered 2023-08-05 – 2023-08-08 (×7): 5 mg via ORAL
  Filled 2023-08-05 (×7): qty 1

## 2023-08-05 MED ORDER — TRAMADOL HCL 50 MG PO TABS
50.0000 mg | ORAL_TABLET | Freq: Four times a day (QID) | ORAL | Status: DC | PRN
  Administered 2023-08-05 – 2023-08-08 (×7): 100 mg via ORAL
  Filled 2023-08-05: qty 2
  Filled 2023-08-05: qty 1
  Filled 2023-08-05 (×2): qty 2
  Filled 2023-08-05: qty 1
  Filled 2023-08-05 (×3): qty 2

## 2023-08-05 MED ORDER — TRAZODONE HCL 50 MG PO TABS
50.0000 mg | ORAL_TABLET | Freq: Every day | ORAL | Status: AC
  Administered 2023-08-05: 50 mg via ORAL
  Filled 2023-08-05: qty 1

## 2023-08-05 MED ORDER — MAGNESIUM SULFATE 2 GM/50ML IV SOLN
2.0000 g | Freq: Once | INTRAVENOUS | Status: AC
  Administered 2023-08-05: 2 g via INTRAVENOUS
  Filled 2023-08-05: qty 50

## 2023-08-05 NOTE — Progress Notes (Signed)
PT Cancellation Note  Patient Details Name: Joel York MRN: [id number] DOB: 18-Apr-1986   Cancelled Treatment:    Reason Eval/Treat Not Completed: Fatigue/lethargy limiting ability to participate. Pt reports just completing ambulation with staff, expresses fatigue at this time. PT will attempt to follow up as time allows.   Arlyss Gandy 08/05/2023, 3:29 PM

## 2023-08-05 NOTE — Progress Notes (Addendum)
VAD Coordinator Note:  Existing VAD dressing removed and site care performed using sterile technique. Kendall observed dressing change today. Drive line exit site cleaned with Chlora prep applicators x 2, allowed to dry, and silver strip with gauze applied. Exit site healing and unincorporated, the velour is fully implanted at exit site. Scant amount of serosanguinous/dried bloody drainage. No redness, tenderness, foul odor or rash noted. 1 suture in place under the driveline. Drive line anchor re-applied. Advance to every other day dressing changes. Next dressing change due Thursday 08/07/23 by VAD coordinator or nurse champion only.       Pt Education: Pts wife Penni Bombard observed dressing change. She will practice donning/doffing sterile gloves at home. Education completed today on the following: Daily routine: System controller self-test Showering Dressing changes Making/breaking connections Switching from wall to batteries Controller education [employer] education Battery and battery clips education Emergency contacts/paging VAD pager Coumadin education  Pt has transfer orders to [facility]. All home equipment ordered.   Carlton Adam RN, BSN VAD Coordinator 24/7 Pager [phone]

## 2023-08-05 NOTE — Progress Notes (Signed)
ANTICOAGULATION CONSULT NOTE - Consult  Pharmacy Consult for warfarin Indication:  LVAD  Allergies  Allergen Reactions   Other Anaphylaxis    Tree Nuts   Peanuts [Peanut Oil] Anaphylaxis    Patient Measurements: Height: 5\' 7"  (170.2 cm) Weight: 60.5 kg (133 lb 6.1 oz) IBW/kg (Calculated) : 66.1   Vital Signs: Temp: 99.2 F (37.3 C) (08/13 0810) Temp Source: Oral (08/13 0810) BP: 109/92 (08/13 0800) Pulse Rate: 129 (08/13 0800)  Labs: Recent Labs    08/03/23 0343 08/03/23 0445 08/04/23 0343 08/05/23 0500  HGB 12.3* 12.2* 12.1* 13.0  HCT 36.7* 36.0* 38.2* 39.2  PLT 248  --  282 351  LABPROT 36.1*  --  30.3* 27.0*  INR 3.6*  --  2.9* 2.5*  CREATININE 0.91  --  0.87 0.79    Estimated Creatinine Clearance: 109.2 mL/min (by C-G formula based on SCr of 0.79 mg/dL).   Medical History: Past Medical History:  Diagnosis Date   Acid reflux    ADHD (attention deficit hyperactivity disorder)    Asthma    Back pain    Seizures (HCC)    Resolved     Assessment: 37yom s/p LVAD started on warfarin post op. No AC PTA.   INR now down to 2.5 (warfarin on hold since 8/9, received FFP 8/10). Hgb 13, plt 351, LDH 316 (stable). LFTs WNL, total bilirubin now WNL. Taking ensure with oral intake improving slightly.   Goal of Therapy:  INR goal 2-2.5 Monitor platelets by anticoagulation protocol: Yes   Plan:  Restart warfarin 0.5 mg once tonight  Daily INR, CBC and hepatic function  Thank you for allowing pharmacy to participate in this patient's care,  Sherron Monday, PharmD, BCCCP Clinical Pharmacist  Phone: [phone] 08/05/2023 8:51 AM  Please check AMION for all [facility] phone numbers After 10:00 PM, call Main Pharmacy [phone]

## 2023-08-05 NOTE — Progress Notes (Addendum)
Advanced Heart Failure VAD Team Note  PCP-Cardiologist: None   Subjective:   8/7 S/P HMIII LVAD 8/8 Extubated.   CO-OX 83% on milrinone 0.125 >> recheck.   CVP 2-3.   MAP 90s. Spikes between doses of meds.  INR 2.5  Chest sore but feels pain is well-controlled. Having panic attacks the last couple of nights.   Has been ambulating the halls.  LVAD INTERROGATION:  HeartMate III LVAD:   Flow 4.2  liters/min, speed 5600, power 5, PI 4.4. 3 PI so far today.  Objective:    Vital Signs:   Temp:  [98.2 F (36.8 C)-99.2 F (37.3 C)] 99.2 F (37.3 C) (08/13 0810) Pulse Rate:  [103-220] 129 (08/13 0800) Resp:  [18-20] 20 (08/13 0800) BP: (96-128)/(74-112) 109/92 (08/13 0800) SpO2:  [89 %-93 %] 93 % (08/13 0800) Weight:  [60.5 kg] 60.5 kg (08/13 0500) Last BM Date : 08/04/23 Mean arterial Pressure 90s-100s  Intake/Output:   Intake/Output Summary (Last 24 hours) at 08/05/2023 0855 Last data filed at 08/05/2023 0800 Gross per 24 hour  Intake 581.57 ml  Output 1800 ml  Net -1218.43 ml     Physical Exam    Physical Exam: GENERAL: Lying comfortably in bed HEENT: normal  NECK: Supple, JVP flat.  2+ bilaterally, no bruits.   CARDIAC:  Mechanical heart sounds with LVAD hum present.  LUNGS:  Clear to auscultation bilaterally.  ABDOMEN:  Soft, round, nontender, positive bowel sounds x4.     LVAD exit site: Dressing dry and intact. EXTREMITIES:  Warm and dry, no cyanosis, clubbing, rash or edema  NEUROLOGIC:  Alert and oriented x 4.  No dysarthria.  Affect pleasant.      Telemetry   ST 120s  Labs   Basic Metabolic Panel: Recent Labs  Lab 08/01/23 0426 08/01/23 1644 08/02/23 0336 08/03/23 0343 08/03/23 0445 08/04/23 0343 08/05/23 0500  NA 133*   < > 134* 136 136 133* 131*  K 3.9   < > 3.7 3.5 3.5 4.1 4.2  CL 96*  --  95* 99  --  99 99  CO2 25  --  28 26  --  25 21*  GLUCOSE 121*  --  119* 95  --  101* 117*  BUN 10  --  6 8  --  10 13  CREATININE 1.07  --   1.03 0.91  --  0.87 0.79  CALCIUM 8.2*  --  8.4* 8.3*  --  8.2* 8.3*  MG 2.1  --  1.7 1.7  --  2.0 1.8  PHOS 1.8*  --  1.5* 2.7  --  3.2 3.1   < > = values in this interval not displayed.    Liver Function Tests: Recent Labs  Lab 08/01/23 0426 08/02/23 0336 08/03/23 0343 08/04/23 0343 08/05/23 0500  AST 121* 54* 32 22 23  ALT 34 23 19 16 16   ALKPHOS 65 66 70 70 70  BILITOT 1.8* 0.5 0.5 0.4 0.8  PROT 5.9* 5.6* 6.2* 6.2* 6.4*  ALBUMIN 3.1* 2.7* 2.8* 2.8* 2.7*   No results for input(s): "LIPASE", "AMYLASE" in the last 168 hours. No results for input(s): "AMMONIA" in the last 168 hours.  CBC: Recent Labs  Lab 08/01/23 0426 08/01/23 1644 08/02/23 0336 08/03/23 0343 08/03/23 0445 08/04/23 0343 08/05/23 0500  WBC 11.5*  --  11.5* 9.7  --  10.3 15.1*  NEUTROABS 9.4*  --  8.6* 6.5  --  7.0 11.2*  HGB 11.9*   < > 12.5*  12.3* 12.2* 12.1* 13.0  HCT 36.6*   < > 39.1 36.7* 36.0* 38.2* 39.2  MCV 90.6  --  90.5 90.2  --  89.7 86.3  PLT 170  --  206 248  --  282 351   < > = values in this interval not displayed.    INR: Recent Labs  Lab 08/02/23 0336 08/02/23 1206 08/03/23 0343 08/04/23 0343 08/05/23 0500  INR 4.2* 4.4* 3.6* 2.9* 2.5*    Other results:   Imaging   DG Chest Port 1 View  Result Date: 08/04/2023 CLINICAL DATA:  LVAD present, difficulty breathing EXAM: PORTABLE CHEST 1 VIEW COMPARISON:  Previous studies including the examination of 08/03/2023 FINDINGS: Transverse diameter of heart is increased. There are no signs of alveolar pulmonary edema in the visualized lung fields. Left ventricular assist device is noted in place. Tip of left IJ central venous catheter is seen at the junction of innominate veins. Tip of right IJ central venous catheter is seen in superior vena cava. Metallic sutures are seen in the sternum. There is increased density in left mid and left lower lung fields suggesting pleural effusion and underlying infiltrate. There are linear patchy  infiltrates in right lower lung field. There is no pneumothorax. IMPRESSION: Cardiomegaly. There is increased opacity in left mid and both lower lung fields suggesting small to moderate left pleural effusion and atelectasis/pneumonia in the lower lung fields, more so on the left side. Support devices as described above. Electronically Signed   By: Ernie Avena M.D.   On: 08/04/2023 09:54   [place] EKG SITE RITE  Result Date: 08/03/2023 If Site Rite image not attached, placement could not be confirmed due to current cardiac rhythm.    Medications:     Scheduled Medications:  aspirin EC  81 mg Oral Daily   bisacodyl  10 mg Oral Daily   buprenorphine-naloxone  1 tablet Sublingual Daily   Chlorhexidine Gluconate Cloth  6 each Topical Daily   digoxin  0.125 mg Oral Daily   docusate sodium  200 mg Oral Daily   feeding supplement  237 mL Oral TID WC   gabapentin  300 mg Oral TID   lidocaine  2 patch Transdermal Q24H   loratadine  10 mg Oral QPM   losartan  25 mg Oral Daily   melatonin  3 mg Oral QHS   mexiletine  200 mg Oral BID   mometasone-formoterol  2 puff Inhalation BID   montelukast  10 mg Oral q morning   pantoprazole  40 mg Oral Daily   polyethylene glycol  17 g Oral Daily   senna-docusate  2 tablet Oral QHS   simethicone  80 mg Oral QID   sodium chloride flush  10-40 mL Intracatheter Q12H   sodium chloride flush  10-40 mL Intracatheter Q12H   sodium chloride flush  3 mL Intravenous Q12H   warfarin  0.5 mg Oral ONCE-1600   Warfarin - Pharmacist Dosing Inpatient   Does not apply q1600    Infusions:  sodium chloride Stopped (08/04/23 1901)   sodium chloride Stopped (08/03/23 1743)   sodium chloride Stopped (08/04/23 1021)   lactated ringers     lactated ringers Stopped (08/03/23 1735)   milrinone 0.125 mcg/kg/min (08/05/23 0800)    PRN Medications: sodium chloride, acetaminophen, cyclobenzaprine, fluticasone, hydrALAZINE, levalbuterol, LORazepam, nitroGLYCERIN,  ondansetron (ZOFRAN) IV, mouth rinse, oxyCODONE, sodium chloride flush, traMADol   Patient Profile   Joel York is a 37 y.o. male with HTN, ADD, asthma, hx  drug abuse (now on suboxone x5 yrs), and seizures. Admitted with acute systolic heart failure. NYHA IV -> shock.   S/P HMIII LVAD   Assessment/Plan:    1. S/P HMIII LVAD  on 8/6 - Continue VAD speed at 5600 today. Ramp echo scheduled for tomorrow. - CO-OX 83% on milrinone 0.125. Recheck CO-OX. Likely plan to stop milrinone today.  - MAPs 90s-100s, up and down between med doses. Increase losartan to 25 BID. - Pain control better. Continue gabapentin. Have been decreasing oxycodone and using tramadol.  - Panic attacks at night. Try to minimize use of Ativan. Start SSRI. - Continue to mobilize  2. Acute biventricular systolic heart failure >> cardiogenic shock - Admitted NYHA IV symptoms .Echo EF <20%,  RV mildly reduced, RV mod enlarged, estimated RV systolic pressure 53.40mmHg, LA mod dilated, RA mod dilated, mild-mod MR/TR - Etiology uncertain. ? 2/2 PVCs vs genetically mediated +/- hypertension.  - LHC with normal coronaries. RHC 07/24: Low CO (Fick CO/CI 3.3/1.9) and PAPi 3.0 on milrinone 0.375 . Off NE - cMRI:  LV markedly dilated EF 10% RV 17% NICM - Limited echo 07/29: LVEF < 20%, RV function improved. - Discussed with [facility] not a candidate for transplant with smoking.  - S/P HMIII as above.  - Plan to wean off milrinone today as above - Volume status ok - Continue digoxin 0.125 mg daily - MAPs remain elevated. Increase losartan to 25 mg BID - INR 2.5. Warfarin dosing per PharmD.  - LDH stable  3. Chest pressure - HsTrop 39>43>37>40, d/t demand ischemia - No CAD on cath - No s/s angina   4. PVCs - snores, will need eventual sleep study  - Suppressed with Mexiletine 200 mg BID - TSH 4.9, T4 0.79 - Keep K>4 and Mg >2   5. AKI - resolved.   6. Tobacco use - smokes 3-6 cigarettes / day - cessation discussed    7. Hx drug abuse - Continue suboxone - minimize narcotics  8. Leukocytosis - WBC 10.3>>15K - AF.  - No clear source of infection. Follow. - CXR today   May be ready to transfer to [facility] soon.  Length of Stay: 22  [name], [name], PA-C  8:55 AM  VAD Team --- VAD ISSUES ONLY--- Pager [phone] (7am - 7am)  Advanced Heart Failure Team  Pager [phone] (M-F; 7a - 5p)  Please contact [facility] for night-coverage after hours (5p -7a ) and weekends on [email or web address]  Patient seen and examined with the above-signed Advanced Practice Provider and/or Housestaff. I personally reviewed laboratory data, imaging studies and relevant notes. I independently examined the patient and formulated the important aspects of the plan. I have edited the note to reflect any of my changes or salient points. I have personally discussed the plan with the patient and/or family.  On milrinone 0.125. Co-ox ok.   Had panic attack last night so tired this am. CP improving  Able to ambulate halls  General:  NAD.  HEENT: normal  Neck: supple. JVP not elevated.  Carotids 2+ bilat; no bruits. No lymphadenopathy or thryomegaly appreciated. Cor: LVAD hum.  Lungs: Clear. Abdomen: obese soft, nontender, non-distended. No hepatosplenomegaly. No bruits or masses. Good bowel sounds. Driveline site clean. Anchor in place.  Extremities: no cyanosis, clubbing, rash. Warm no edema  Neuro: alert & oriented x 3. No focal deficits. Moves all 4 without problem   Can turn milrinone off.   Increase losartan to 25 bid  INR 2.5. Discussed dosing  with PharmD personally.  Start Zoloft. Benzos prn.  Can move to [facility]  VAD interrogated personally. Parameters stable. Ramp echo tomorrow.   [name], MD  9:36 AM

## 2023-08-05 NOTE — Progress Notes (Addendum)
HeartMate 3 Rounding Note Postop day #6 Subjective:   37 year old male with nonischemic cardiomyopathy presented with cardiogenic shock from severe LV dysfunction and moderate to severe RV dysfunction.  After full evaluation and counseling he underwent implantation of HeartMate 3 July 30, 2023  Patient gradually better with postop pain Better mobility VAD flows are satisfactory Ready for transfer to [facility]    INR  1.5>>4.4>>4.5>>2.9>>2.5 Coumadin per Pharm.D.  All chest drains have been removed. Temporary V PW's will be removed prior to discharge.  LVAD INTERROGATION:  HeartMate II LVAD:  Flow 4.2  liters/min, speed 5600, power 3.5, PI 3.7.  Controller intact  Objective:    Vital Signs:   Temp:  [98.2 F (36.8 C)-99.2 F (37.3 C)] 98.3 F (36.8 C) (08/13 1200) Pulse Rate:  [114-222] 128 (08/13 1500) Resp:  [18-20] 18 (08/13 1200) BP: (96-128)/(66-112) 96/66 (08/13 1500) SpO2:  [89 %-94 %] 93 % (08/13 1500) Weight:  [60.5 kg] 60.5 kg (08/13 0500) Last BM Date : 08/04/23 Mean arterial Pressure 70 mm Hg  Intake/Output:   Intake/Output Summary (Last 24 hours) at 08/05/2023 1611 Last data filed at 08/05/2023 1300 Gross per 24 hour  Intake 1417.45 ml  Output 1950 ml  Net -532.55 ml     Physical Exam: General:  Well appearing. No resp difficulty.  Complains of left chest pocket pain. HEENT: normal Neck: supple. JVP . Carotids 2+ bilat; no bruits. No lymphadenopathy or thryomegaly appreciated. Cor: Mechanical heart sounds with LVAD hum present. Lungs: clear Abdomen: soft, nontender, nondistended. No hepatosplenomegaly. No bruits or masses.  Diminished bowel sounds. Extremities: no cyanosis, clubbing, rash, edema Neuro: alert & orientedx3, cranial nerves grossly intact. moves all 4 extremities w/o difficulty.  Okay pleasant  Telemetry: Sinus tach 110-120  Labs: Basic Metabolic Panel: Recent Labs  Lab 08/01/23 0426 08/01/23 1644 08/02/23 0336 08/03/23 0343  08/03/23 0445 08/04/23 0343 08/05/23 0500  NA 133*   < > 134* 136 136 133* 131*  K 3.9   < > 3.7 3.5 3.5 4.1 4.2  CL 96*  --  95* 99  --  99 99  CO2 25  --  28 26  --  25 21*  GLUCOSE 121*  --  119* 95  --  101* 117*  BUN 10  --  6 8  --  10 13  CREATININE 1.07  --  1.03 0.91  --  0.87 0.79  CALCIUM 8.2*  --  8.4* 8.3*  --  8.2* 8.3*  MG 2.1  --  1.7 1.7  --  2.0 1.8  PHOS 1.8*  --  1.5* 2.7  --  3.2 3.1   < > = values in this interval not displayed.    Liver Function Tests: Recent Labs  Lab 08/01/23 0426 08/02/23 0336 08/03/23 0343 08/04/23 0343 08/05/23 0500  AST 121* 54* 32 22 23  ALT 34 23 19 16 16   ALKPHOS 65 66 70 70 70  BILITOT 1.8* 0.5 0.5 0.4 0.8  PROT 5.9* 5.6* 6.2* 6.2* 6.4*  ALBUMIN 3.1* 2.7* 2.8* 2.8* 2.7*   No results for input(s): "LIPASE", "AMYLASE" in the last 168 hours. No results for input(s): "AMMONIA" in the last 168 hours.  CBC: Recent Labs  Lab 08/01/23 0426 08/01/23 1644 08/02/23 0336 08/03/23 0343 08/03/23 0445 08/04/23 0343 08/05/23 0500  WBC 11.5*  --  11.5* 9.7  --  10.3 15.1*  NEUTROABS 9.4*  --  8.6* 6.5  --  7.0 11.2*  HGB 11.9*   < >  12.5* 12.3* 12.2* 12.1* 13.0  HCT 36.6*   < > 39.1 36.7* 36.0* 38.2* 39.2  MCV 90.6  --  90.5 90.2  --  89.7 86.3  PLT 170  --  206 248  --  282 351   < > = values in this interval not displayed.    INR: Recent Labs  Lab 08/02/23 0336 08/02/23 1206 08/03/23 0343 08/04/23 0343 08/05/23 0500  INR 4.2* 4.4* 3.6* 2.9* 2.5*    Other results: EKG:   Imaging: A.m. chest x-ray personally reviewed.   Medications:     Scheduled Medications:  aspirin EC  81 mg Oral Daily   bisacodyl  10 mg Oral Daily   buprenorphine-naloxone  1 tablet Sublingual Daily   Chlorhexidine Gluconate Cloth  6 each Topical Daily   digoxin  0.125 mg Oral Daily   docusate sodium  200 mg Oral Daily   feeding supplement  237 mL Oral TID WC   gabapentin  300 mg Oral TID   lidocaine  2 patch Transdermal Q24H    loratadine  10 mg Oral QPM   losartan  25 mg Oral BID   melatonin  3 mg Oral QHS   mexiletine  200 mg Oral BID   mometasone-formoterol  2 puff Inhalation BID   montelukast  10 mg Oral q morning   pantoprazole  40 mg Oral Daily   polyethylene glycol  17 g Oral Daily   senna-docusate  2 tablet Oral QHS   sertraline  25 mg Oral Daily   simethicone  80 mg Oral QID   sodium chloride flush  10-40 mL Intracatheter Q12H   sodium chloride flush  10-40 mL Intracatheter Q12H   sodium chloride flush  3 mL Intravenous Q12H   traZODone  50 mg Oral QHS   Warfarin - Pharmacist Dosing Inpatient   Does not apply q1600    Infusions:  sodium chloride Stopped (08/04/23 1901)   sodium chloride Stopped (08/03/23 1743)   sodium chloride Stopped (08/04/23 1021)   lactated ringers     lactated ringers Stopped (08/03/23 1735)    PRN Medications: sodium chloride, acetaminophen, cyclobenzaprine, fluticasone, hydrALAZINE, levalbuterol, LORazepam, nitroGLYCERIN, ondansetron (ZOFRAN) IV, mouth rinse, oxyCODONE, sodium chloride flush, traMADol, [START ON 08/06/2023] traZODone   Assessment:  Status post HeartMate 3 implantation July 30, 2023 for nonischemic cardiomyopathy with biventricular dysfunction  Past medical history positive for polycythemia, asthma, and chronic Suboxone treatment for opioid abuse    Plan/Discussion:    Surgical incisions clean, dry WBC up to 15k- all OR lines are out, Foley out  CXR today with some improvement in aeration  Will remove EPWs when INR stable in a stable range    I reviewed the LVAD parameters from today, and compared the results to the patient's prior recorded data.  No programming changes were made.  The LVAD is functioning within specified parameters.  The patient performs LVAD self-test daily.  LVAD interrogation was negative for any significant power changes, alarms or PI events/speed drops.  LVAD equipment check completed and is in good working order.   Back-up equipment present.   LVAD education done on emergency procedures and precautions and reviewed exit site care.  Length of Stay: [address]  Lovett Sox 08/05/2023, 4:11 PM

## 2023-08-05 NOTE — Progress Notes (Signed)
CSW met with patient and wife at bedside. Patient reports he ambulated around the unit and feeling confident in his recovery. He did express that he was exhausted but pleased with progress. CSW provided supportive intervention and will continue to follow. Lasandra Beech, LCSW, CCSW-MCS [phone]

## 2023-08-05 NOTE — Progress Notes (Signed)
LVAD Coordinator Rounding Note:  Admitted 07/14/23 from ED due to worsening HF symptoms.   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  Pt laying in bed. States he did not get any sleep last night due to panic attack. States pain is well controlled (4/10) this morning.   WBC 15.1 today. Afebrile. Encourage to get OOB, ambulate, and use IS today. He verbalized understanding.   Plan for RAMP echo on Wednesday at 10:00.   Will plan to begin discharge teaching tomorrow with pt and wife. Kendall observed dressing change today. Education notebook delivered to bedside. Advised to begin reviewing. Pt verbalized understanding.   Vital signs: Temp: 99.2 HR: 115 Doppler Pressure: 94 Auto BP: 104/85 (94) O2 Sat: 92% on RA Wt: 150.3>154.3>161.3>146.3>133.4 lbs     LVAD interrogation reveals:  Speed: 5600 Flow: 4.1 Power: 4.4 w PI: 4.1  Alarms: none Events: rare Hematocrit: 39  Fixed speed: 5500 Low speed limit: 5200  Drive Line: See Carlton Adam RN's note for dressing change details today.   Labs:  LDH trend: [phone]  INR trend: 1.4>1.5>2.9>2.5  WBC trend: 10.3>15.1  Anticoagulation Plan: -INR Goal: 2-2.5 -ASA Dose: 325  Blood Products:  Intra op: 2 FFP 450 Cell Saver  Arrythmias:   Infection:   Renal:  -BUN/CRT: 16/1.09>10/1.07>10/0.87>13/0.79  Gtts: Milrinone 0.125 mcg/kg/min-- off 8/13   Patient Education: Pt states he is very tired as he did not sleep last night due to panic attack. VAD coordinator demonstrated dressing change with Kendall  Adverse Events on VAD: -  Plan/Recommendations:  1. Continue daily dressing changes by VAD coordinator or Nurse Alla Feeling 2. Please page VAD coordinator for any alarms or VAD equipment issues.  Alyce Pagan RN VAD Coordinator  Office: [phone]  24/7 Pager: [phone]

## 2023-08-06 ENCOUNTER — Inpatient Hospital Stay (HOSPITAL_COMMUNITY): Payer: Medicaid Other

## 2023-08-06 DIAGNOSIS — Z95811 Presence of heart assist device: Secondary | ICD-10-CM

## 2023-08-06 DIAGNOSIS — I5082 Biventricular heart failure: Secondary | ICD-10-CM | POA: Diagnosis not present

## 2023-08-06 DIAGNOSIS — I5021 Acute systolic (congestive) heart failure: Secondary | ICD-10-CM | POA: Diagnosis not present

## 2023-08-06 DIAGNOSIS — R570 Cardiogenic shock: Secondary | ICD-10-CM | POA: Diagnosis not present

## 2023-08-06 LAB — ECHOCARDIOGRAM LIMITED
Est EF: <20
Height: 67 in
Weight: 2345.69 oz

## 2023-08-06 MED ORDER — WARFARIN 0.5 MG HALF TABLET
0.5000 mg | ORAL_TABLET | Freq: Once | ORAL | Status: AC
  Administered 2023-08-06: 0.5 mg via ORAL
  Filled 2023-08-06: qty 1

## 2023-08-06 MED ORDER — DOXYCYCLINE HYCLATE 100 MG PO TABS
100.0000 mg | ORAL_TABLET | Freq: Two times a day (BID) | ORAL | Status: DC
  Administered 2023-08-06 – 2023-08-08 (×5): 100 mg via ORAL
  Filled 2023-08-06 (×5): qty 1

## 2023-08-06 NOTE — Progress Notes (Addendum)
Advanced Heart Failure VAD Team Note  PCP-Cardiologist: None   Subjective:   8/7 S/P HMIII LVAD 8/8 Extubated.   CO-OX 77% on milrinone 0.125  MAP 70s-80s  INR 2.4  Has been ambulating halls. Pain continues to improve.   Ramp echo today  LVAD INTERROGATION:  HeartMate III LVAD:   Flow 4.5  liters/min, speed 5600, power 4.4, PI 3.4  Objective:    Vital Signs:   Temp:  [98.3 F (36.8 C)-100 F (37.8 C)] 98.3 F (36.8 C) (08/13 2335) Pulse Rate:  [115-247] 120 (08/14 0800) Resp:  [16-18] 16 (08/13 1600) BP: (74-115)/(53-90) 92/70 (08/14 0800) SpO2:  [89 %-95 %] 93 % (08/14 0800) Weight:  [66.5 kg] 66.5 kg (08/14 0500) Last BM Date : 08/04/23 Mean arterial Pressure 70s-90s  Intake/Output:   Intake/Output Summary (Last 24 hours) at 08/06/2023 0847 Last data filed at 08/06/2023 0610 Gross per 24 hour  Intake 1154.13 ml  Output 1725 ml  Net -570.87 ml     Physical Exam    Physical Exam: GENERAL: Lying in bed. HEENT: normal  NECK: Supple, JVP not elevated .  2+ bilaterally, no bruits.   CARDIAC:  Mechanical heart sounds with LVAD hum present.  LUNGS:  Clear to auscultation bilaterally.  ABDOMEN:  Soft, round, nontender, positive bowel sounds x4.     LVAD exit site:Dressing dry and intact. Stabilization device present and accurately applied.  EXTREMITIES:  Warm and dry, no cyanosis, clubbing, rash or edema  NEUROLOGIC:  Alert and oriented x 4.   Affect pleasant.        Telemetry   ST 120s  Labs   Basic Metabolic Panel: Recent Labs  Lab 08/02/23 0336 08/03/23 0343 08/03/23 0445 08/04/23 0343 08/05/23 0500 08/06/23 0521  NA 134* 136 136 133* 131* 132*  K 3.7 3.5 3.5 4.1 4.2 4.2  CL 95* 99  --  99 99 96*  CO2 28 26  --  25 21* 22  GLUCOSE 119* 95  --  101* 117* 133*  BUN 6 8  --  10 13 14   CREATININE 1.03 0.91  --  0.87 0.79 0.99  CALCIUM 8.4* 8.3*  --  8.2* 8.3* 8.5*  MG 1.7 1.7  --  2.0 1.8 1.9  PHOS 1.5* 2.7  --  3.2 3.1 3.0    Liver  Function Tests: Recent Labs  Lab 08/02/23 0336 08/03/23 0343 08/04/23 0343 08/05/23 0500 08/06/23 0521  AST 54* 32 22 23 47*  ALT 23 19 16 16 29   ALKPHOS 66 70 70 70 82  BILITOT 0.5 0.5 0.4 0.8 0.5  PROT 5.6* 6.2* 6.2* 6.4* 6.7  ALBUMIN 2.7* 2.8* 2.8* 2.7* 2.7*   No results for input(s): "LIPASE", "AMYLASE" in the last 168 hours. No results for input(s): "AMMONIA" in the last 168 hours.  CBC: Recent Labs  Lab 08/02/23 0336 08/03/23 0343 08/03/23 0445 08/04/23 0343 08/05/23 0500 08/06/23 0521  WBC 11.5* 9.7  --  10.3 15.1* 14.6*  NEUTROABS 8.6* 6.5  --  7.0 11.2* 10.2*  HGB 12.5* 12.3* 12.2* 12.1* 13.0 13.5  HCT 39.1 36.7* 36.0* 38.2* 39.2 40.3  MCV 90.5 90.2  --  89.7 86.3 87.8  PLT 206 248  --  282 351 415*    INR: Recent Labs  Lab 08/02/23 1206 08/03/23 0343 08/04/23 0343 08/05/23 0500 08/06/23 0521  INR 4.4* 3.6* 2.9* 2.5* 2.4*    Other results:   Imaging   DG Chest Port 1 View  Result Date: 08/05/2023  CLINICAL DATA:  Left ventricular assist device. EXAM: PORTABLE CHEST 1 VIEW COMPARISON:  Radiographs 08/04/2023 and 08/03/2023.  CT 07/18/2023. FINDINGS: 0541 hours. Bilateral IJ central venous catheters have been removed in the interval. There is a new right arm PICC which projects to the level of the mid right atrium. Stable cardiomegaly post median sternotomy. Left ventricular assist device appears unchanged in position. Similar left greater than right basilar pulmonary opacities and probable small pleural effusions. No evidence of pneumothorax or definite edema. The bones appear unchanged. IMPRESSION: 1. Interval removal of bilateral IJ central venous catheters. New right arm PICC projects to the level of the mid right atrium. 2. Otherwise stable appearance of the chest with probable pleural effusions and left greater than right basilar airspace opacities. Electronically Signed   By: Carey Bullocks M.D.   On: 08/05/2023 10:15     Medications:      Scheduled Medications:  aspirin EC  81 mg Oral Daily   bisacodyl  10 mg Oral Daily   buprenorphine-naloxone  1 tablet Sublingual Daily   Chlorhexidine Gluconate Cloth  6 each Topical Daily   digoxin  0.125 mg Oral Daily   docusate sodium  200 mg Oral Daily   feeding supplement  237 mL Oral TID WC   gabapentin  300 mg Oral TID   lidocaine  2 patch Transdermal Q24H   loratadine  10 mg Oral QPM   losartan  25 mg Oral BID   melatonin  3 mg Oral QHS   mexiletine  200 mg Oral BID   mometasone-formoterol  2 puff Inhalation BID   montelukast  10 mg Oral q morning   pantoprazole  40 mg Oral Daily   polyethylene glycol  17 g Oral Daily   senna-docusate  2 tablet Oral QHS   sertraline  25 mg Oral Daily   simethicone  80 mg Oral QID   sodium chloride flush  10-40 mL Intracatheter Q12H   sodium chloride flush  10-40 mL Intracatheter Q12H   sodium chloride flush  3 mL Intravenous Q12H   Warfarin - Pharmacist Dosing Inpatient   Does not apply q1600    Infusions:  sodium chloride Stopped (08/04/23 1901)   sodium chloride Stopped (08/03/23 1743)   sodium chloride Stopped (08/04/23 1021)   lactated ringers     lactated ringers Stopped (08/03/23 1735)    PRN Medications: sodium chloride, acetaminophen, cyclobenzaprine, fluticasone, hydrALAZINE, levalbuterol, LORazepam, nitroGLYCERIN, ondansetron (ZOFRAN) IV, mouth rinse, oxyCODONE, sodium chloride flush, traMADol, traZODone   Patient Profile   Joel York is a 37 y.o. male with HTN, ADD, asthma, hx drug abuse (now on suboxone x5 yrs), and seizures. Admitted with acute systolic heart failure. NYHA IV -> shock.   S/P HMIII LVAD   Assessment/Plan:    1. S/P HMIII LVAD  on 8/6 - Continue VAD speed at 5600 for now. Ramp echo today - CO-OX 77% off milrinone - MAPs stable. Continue losartan 25 mg BID. - Pain control better. Continue gabapentin. Have been decreasing oxycodone and using tramadol.  - Panic attacks at night. Started SSRI  and Trazodone to help with sleep. Minimize Ativan. - Continue to mobilize  2. Acute biventricular systolic heart failure >> cardiogenic shock - Admitted NYHA IV symptoms .Echo EF <20%,  RV mildly reduced, RV mod enlarged, estimated RV systolic pressure 53.54mmHg, LA mod dilated, RA mod dilated, mild-mod MR/TR - Etiology uncertain. ? 2/2 PVCs vs genetically mediated +/- hypertension.  - LHC with normal coronaries. RHC 07/24: Low  CO (Fick CO/CI 3.3/1.9) and PAPi 3.0 on milrinone 0.375 . Off NE - cMRI:  LV markedly dilated EF 10% RV 17% NICM - Limited echo 07/29: LVEF < 20%, RV function improved. - Discussed with [facility] not a candidate for transplant with smoking.  - S/P HMIII - Volume status ok - Continue digoxin 0.125 mg daily - MAPs remain elevated. Increase losartan to 25 mg BID - INR 2.4. Warfarin dosing per PharmD.  - LDH stable  3. Chest pressure - HsTrop 39>43>37>40, d/t demand ischemia - No CAD on cath - No s/s angina   4. PVCs - snores, will need eventual sleep study  - Suppressed with Mexiletine 200 mg BID - TSH 4.9, T4 0.79 - Keep K>4 and Mg >2   5. AKI - resolved.   6. Tobacco use - smokes 3-6 cigarettes / day - cessation discussed   7. Hx drug abuse - Continue suboxone - minimize narcotics  8. Leukocytosis - WBC 10.3>>15>>14K - T max 100F overnight - Start doxy 100 mg BID X 7 days. BC X 2, respiratory culture. Use incentive spirometer    Stable for transfer to 2C.  Length of Stay: 23  [name], [name], PA-C  8:47 AM  VAD Team --- VAD ISSUES ONLY--- Pager [phone] (7am - 7am)  Advanced Heart Failure Team  Pager [phone] (M-F; 7a - 5p)  Please contact [facility] for night-coverage after hours (5p -7a ) and weekends on [email or web address]  Patient seen and examined with the above-signed Advanced Practice Provider and/or Housestaff. I personally reviewed laboratory data, imaging studies and relevant notes. I independently examined the patient and formulated  the important aspects of the plan. I have edited the note to reflect any of my changes or salient points. I have personally discussed the plan with the patient and/or family.  Milrinone stopped yesterday.  Feels good. Walking halls. Pain getting under better control. Had good BM   Ramp echo today LV size markedly improved. Excellent cannula psotion. Moderate RV dysfunction with RV VTI 7 septum midline   General:  NAD.  HEENT: normal  Neck: supple. JVP not elevated.  Carotids 2+ bilat; no bruits. No lymphadenopathy or thryomegaly appreciated. Cor: LVAD hum.  Lungs: Clear. Abdomen: soft, nontender, non-distended. No hepatosplenomegaly. No bruits or masses. Good bowel sounds. Driveline site clean. Anchor in place.  Extremities: no cyanosis, clubbing, rash. Warm no edema  Neuro: alert & oriented x 3. No focal deficits. Moves all 4 without problem   Doing well.. Ramp echo looks good. Increase losartan too 25 bid. Start doxy for low grade fever/WBC. Encourage incentive spirometer.   INR 2.5 Discussed dosing with PharmD personally.  VAD interrogated personally. Parameters stable.  Move to [facility].  [name], MD  3:29 PM

## 2023-08-06 NOTE — Progress Notes (Signed)
HeartMate 3 Rounding Note Postop day #6 Subjective:   37 year old male with nonischemic cardiomyopathy presented with cardiogenic shock from severe LV dysfunction and moderate to severe RV dysfunction.  After full evaluation and counseling he underwent implantation of HeartMate 3 July 30, 2023  Patient gradually better with postop pain VAD speed study with echo shows 5600 RPMs is optimal.  Surgical incisions are healing well. Will remove the E PW's tomorrow. INR today 2.6.    INR  1.5>>4.4>>4.5>>2.9>>2.5 >> 2.6  Coumadin per Pharm.D.  All chest drains have been removed. Temporary V PW's will be removed prior to discharge.  LVAD INTERROGATION:  HeartMate II LVAD:  Flow 4.2  liters/min, speed 5600, power 3.5, PI 3.7.  Controller intact  Objective:    Vital Signs:   Temp:  [98.3 F (36.8 C)-100 F (37.8 C)] 98.5 F (36.9 C) (08/14 1100) Pulse Rate:  [116-247] 123 (08/14 1200) Resp:  [16] 16 (08/13 1600) BP: (74-115)/(53-89) 84/64 (08/14 1200) SpO2:  [89 %-95 %] 91 % (08/14 1200) Weight:  [66.5 kg] 66.5 kg (08/14 0500) Last BM Date : 08/04/23 Mean arterial Pressure 70 mm Hg  Intake/Output:   Intake/Output Summary (Last 24 hours) at 08/06/2023 1225 Last data filed at 08/06/2023 0610 Gross per 24 hour  Intake 870 ml  Output 1725 ml  Net -855 ml     Physical Exam: General:  Well appearing. No resp difficulty.  Complains of left chest pocket pain. HEENT: normal Neck: supple. JVP . Carotids 2+ bilat; no bruits. No lymphadenopathy or thryomegaly appreciated. Cor: Mechanical heart sounds with LVAD hum present. Lungs: clear Abdomen: soft, nontender, nondistended. No hepatosplenomegaly. No bruits or masses.  Diminished bowel sounds. Extremities: no cyanosis, clubbing, rash, edema Neuro: alert & orientedx3, cranial nerves grossly intact. moves all 4 extremities w/o difficulty.  Okay pleasant  Telemetry: Sinus tach 110-120  Labs: Basic Metabolic Panel: Recent Labs  Lab  08/02/23 0336 08/03/23 0343 08/03/23 0445 08/04/23 0343 08/05/23 0500 08/06/23 0521  NA 134* 136 136 133* 131* 132*  K 3.7 3.5 3.5 4.1 4.2 4.2  CL 95* 99  --  99 99 96*  CO2 28 26  --  25 21* 22  GLUCOSE 119* 95  --  101* 117* 133*  BUN 6 8  --  10 13 14   CREATININE 1.03 0.91  --  0.87 0.79 0.99  CALCIUM 8.4* 8.3*  --  8.2* 8.3* 8.5*  MG 1.7 1.7  --  2.0 1.8 1.9  PHOS 1.5* 2.7  --  3.2 3.1 3.0    Liver Function Tests: Recent Labs  Lab 08/02/23 0336 08/03/23 0343 08/04/23 0343 08/05/23 0500 08/06/23 0521  AST 54* 32 22 23 47*  ALT 23 19 16 16 29   ALKPHOS 66 70 70 70 82  BILITOT 0.5 0.5 0.4 0.8 0.5  PROT 5.6* 6.2* 6.2* 6.4* 6.7  ALBUMIN 2.7* 2.8* 2.8* 2.7* 2.7*   No results for input(s): "LIPASE", "AMYLASE" in the last 168 hours. No results for input(s): "AMMONIA" in the last 168 hours.  CBC: Recent Labs  Lab 08/02/23 0336 08/03/23 0343 08/03/23 0445 08/04/23 0343 08/05/23 0500 08/06/23 0521  WBC 11.5* 9.7  --  10.3 15.1* 14.6*  NEUTROABS 8.6* 6.5  --  7.0 11.2* 10.2*  HGB 12.5* 12.3* 12.2* 12.1* 13.0 13.5  HCT 39.1 36.7* 36.0* 38.2* 39.2 40.3  MCV 90.5 90.2  --  89.7 86.3 87.8  PLT 206 248  --  282 351 415*    INR: Recent Labs  Lab 08/02/23 1206 08/03/23 0343 08/04/23 0343 08/05/23 0500 08/06/23 0521  INR 4.4* 3.6* 2.9* 2.5* 2.4*    Other results: EKG:   Imaging: A.m. chest x-ray personally reviewed.   Medications:     Scheduled Medications:  aspirin EC  81 mg Oral Daily   bisacodyl  10 mg Oral Daily   buprenorphine-naloxone  1 tablet Sublingual Daily   Chlorhexidine Gluconate Cloth  6 each Topical Daily   digoxin  0.125 mg Oral Daily   docusate sodium  200 mg Oral Daily   doxycycline  100 mg Oral Q12H   feeding supplement  237 mL Oral TID WC   gabapentin  300 mg Oral TID   lidocaine  2 patch Transdermal Q24H   loratadine  10 mg Oral QPM   losartan  25 mg Oral BID   melatonin  3 mg Oral QHS   mexiletine  200 mg Oral BID    mometasone-formoterol  2 puff Inhalation BID   montelukast  10 mg Oral q morning   pantoprazole  40 mg Oral Daily   polyethylene glycol  17 g Oral Daily   senna-docusate  2 tablet Oral QHS   sertraline  25 mg Oral Daily   simethicone  80 mg Oral QID   sodium chloride flush  10-40 mL Intracatheter Q12H   sodium chloride flush  10-40 mL Intracatheter Q12H   sodium chloride flush  3 mL Intravenous Q12H   Warfarin - Pharmacist Dosing Inpatient   Does not apply q1600    Infusions:  sodium chloride Stopped (08/04/23 1901)   sodium chloride Stopped (08/03/23 1743)   sodium chloride Stopped (08/04/23 1021)   lactated ringers     lactated ringers Stopped (08/03/23 1735)    PRN Medications: sodium chloride, acetaminophen, cyclobenzaprine, fluticasone, hydrALAZINE, levalbuterol, LORazepam, nitroGLYCERIN, ondansetron (ZOFRAN) IV, mouth rinse, oxyCODONE, sodium chloride flush, traMADol, traZODone   Assessment:  Status post HeartMate 3 implantation July 30, 2023 for nonischemic cardiomyopathy with biventricular dysfunction  Past medical history positive for polycythemia, asthma, and chronic Suboxone treatment for opioid abuse    Plan/Discussion:    Surgical incisions clean, dry WBC 14.6 all OR lines are out, Foley out  CXR today with some improvement in aeration  Remove E PW's tomorrow    I reviewed the LVAD parameters from today, and compared the results to the patient's prior recorded data.  No programming changes were made.  The LVAD is functioning within specified parameters.  The patient performs LVAD self-test daily.  LVAD interrogation was negative for any significant power changes, alarms or PI events/speed drops.  LVAD equipment check completed and is in good working order.  Back-up equipment present.   LVAD education done on emergency procedures and precautions and reviewed exit site care.  Length of Stay: [address]  Lovett Sox 08/06/2023, 12:25 PM

## 2023-08-06 NOTE — Progress Notes (Signed)
EVENING ROUNDS NOTE :     [address]       [place] [address]             [phone]                 7 Days Post-Op Procedure(s) (LRB): INSERTION OF IMPLANTABLE LEFT VENTRICULAR ASSIST DEVICE (N/A) TRANSESOPHAGEAL ECHOCARDIOGRAM (N/A)   Total Length of Stay:  LOS: 23 days  Events:   No events Stable day    BP (!) 126/99   Pulse (!) 112   Temp 98.5 F (36.9 C) (Oral)   Resp 16   Ht 5\' 7"  (1.702 m)   Wt 66.5 kg   SpO2 92%   BMI 22.96 kg/m   CVP:  [1 mmHg-14 mmHg] 14 mmHg      sodium chloride Stopped (08/03/23 1743)    I/O last 3 completed shifts: In: 1679.8 [P.O.:1350; I.V.:291.5; IV Piggyback:38.4] Out: 2875 [Urine:2875]      Latest Ref Rng & Units 08/06/2023    5:21 AM 08/05/2023    5:00 AM 08/04/2023    3:43 AM  CBC  WBC 4.0 - 10.5 K/uL 14.6  15.1  10.3   Hemoglobin 13.0 - 17.0 g/dL 10.1  75.1  02.5   Hematocrit 39.0 - 52.0 % 40.3  39.2  38.2   Platelets 150 - 400 K/uL 415  351  282        Latest Ref Rng & Units 08/06/2023    5:21 AM 08/05/2023    5:00 AM 08/04/2023    3:43 AM  BMP  Glucose 70 - 99 mg/dL 852  778  242   BUN 6 - 20 mg/dL 14  13  10    Creatinine 0.61 - 1.24 mg/dL 3.53  6.14  4.31   Sodium 135 - 145 mmol/L 132  131  133   Potassium 3.5 - 5.1 mmol/L 4.2  4.2  4.1   Chloride 98 - 111 mmol/L 96  99  99   CO2 22 - 32 mmol/L 22  21  25    Calcium 8.9 - 10.3 mg/dL 8.5  8.3  8.2     ABG    Component Value Date/Time   PHART 7.510 (H) 08/03/2023 0445   PCO2ART 34.1 08/03/2023 0445   PO2ART 90 08/03/2023 0445   HCO3 27.3 08/03/2023 0445   TCO2 28 08/03/2023 0445   ACIDBASEDEF 2.0 07/31/2023 1704   O2SAT 77 08/06/2023 0525       Joel Greathouse, MD 08/06/2023 4:12 PM

## 2023-08-06 NOTE — Progress Notes (Signed)
This chaplain is present for F/U spiritual care. The chaplain affirmed the Pt. celebration of the progress he has made in the last few days. The chaplain understands the Pt. anticipated d/c is Friday.  The chaplain listened reflectively as the Pt. described his growing faith during this admission. The chaplain understands from the Pt. there is more meaning for him in "taking one day at a time" after the LVAD procedure. The Pt. family is a top priority.  The chaplain is available for F/U spiritual care as needed.  Chaplain Stephanie Acre [phone]

## 2023-08-06 NOTE — Progress Notes (Signed)
Nutrition Follow-up  DOCUMENTATION CODES:   Not applicable  INTERVENTION:   Continue snacks TID between meals  D/C Ensure Enlive  D/C double portions  If po intake not adequate on follow-up, recommend liberalizing diet   NUTRITION DIAGNOSIS:   Increased nutrient needs related to chronic illness as evidenced by estimated needs.  Being addressed via snacks (small, frequent meals)  GOAL:   Patient will meet greater than or equal to 90% of their needs  Progressing  MONITOR:   PO intake, Supplement acceptance, Weight trends, Labs  REASON FOR ASSESSMENT:   Consult LVAD Eval  ASSESSMENT:   37 yo male admitted with acute biventricular heart failure with cardiogenic shock. PMH includes HTN, hx drug abuse (now on suboxone x 5 years), ADD, asthma, seizures.  7/22 Admitted 7/25 RD consult for possible VAD 8/07 OR: HM3 LVAD placed 8/08 Extubated  Pt appears to feel better than when RD assessed him at the end of last week. Pt reports pain still present but much improved. Pt appears to be breathing more comfortably as well, on room air  Pt reports appetite improving post surgery. No recorded po intake since 8/10 (po intake 10% all 3 meals that day). Pt eating orange sherbet on visit today. Pt says he has not been receiving his snacks, RD to check system to make sure snacks are ordered. Discussed with RN as well regarding delivery.   Pt requests no longer receiving double portions; he believes the 3 meals and 3 snacks is sufficient.   Noted 3 Ensure, multiple unopened milks at bedside. Pt not really drinking Ensure, prefers snacks as supplement. Plan to d/c Ensure for now  Labs: sodium 132 (L), Creatinine wdl, INR 2.4 Meds: coumadin, miralax, senna-docusate, tramadol, simethicone, colace, dulcolax, flexeril, lidocaine patch   Diet Order:   Diet Order             Diet Heart Room service appropriate? Yes; Fluid consistency: Thin; Fluid restriction: 1800 mL Fluid  Diet  effective now                   EDUCATION NEEDS:   Education needs have been addressed  Skin:  Skin Assessment: Skin Integrity Issues: Skin Integrity Issues:: Incisions Incisions: Driveline site post HM3 LVAD on 07/30/23  Last BM:  8/12  Height:   Ht Readings from Last 1 Encounters:  07/15/23 5\' 7"  (1.702 m)    Weight:   Wt Readings from Last 1 Encounters:  08/06/23 66.5 kg    BMI:  Body mass index is 22.96 kg/m.  Estimated Nutritional Needs:   Kcal:  2100-2300 kcals  Protein:  110-125 g  Fluid:  1.8 L   Romelle Starcher MS, RDN, LDN, CNSC Registered Dietitian 3 Clinical Nutrition RD Pager and On-Call Pager Number Located in [place]

## 2023-08-06 NOTE — Progress Notes (Signed)
Speed  Flow  PI  Power  LVIDD  AI  Aortic opening MR  TR  Septum  RV  VTI (>18cm)  5600  4.5 3.2 4.3 4.4 trivial 0/5  trivial  trivial mild moderately  7.0                                                                         Doppler MAP: 74 Auto cuff BP: 94/80 (87)   Ramp ECHO performed at bedside per   At completion of ramp study, patients primary controller programmed:  Fixed speed: 5600 Low speed limit: 5300   Alyce Pagan RN VAD Coordinator  Office: [phone]  24/7 Pager: [phone]

## 2023-08-06 NOTE — Progress Notes (Signed)
Physical Therapy Treatment Patient Details Name: Joel York MRN: [id number] DOB: [birth year] Today's Date: 08/06/2023   History of Present Illness Pt is 37 year old presented to [facility] on  07/14/23 for BLE swelling and SOB. Pt with acute biventricular systolic heart failure.  Underwent placement of LVAD on 07/30/23. 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    PT Comments  Pt tolerates treatment well, ambulating for increased distances with improved gait speed. Pt expresses confidence with management of LVAD batteries and controller. Pt will benefit from continued frequent mobilization in an effort to improve activity tolerance.    If plan is discharge home, recommend the following: Assist for transportation;Assistance with cooking/housework;Help with stairs or ramp for entrance   Can travel by private vehicle        Equipment Recommendations  None recommended by PT    Recommendations for Other Services       Precautions / Restrictions Precautions Precautions: Other (comment) Precaution Comments: LVAD Restrictions Weight Bearing Restrictions: No Other Position/Activity Restrictions: sternal precautions     Mobility  Bed Mobility Overal bed mobility: Modified Independent             General bed mobility comments: HOB elevated    Transfers Overall transfer level: Modified independent                 General transfer comment: increased time    Ambulation/Gait Ambulation/Gait assistance: Independent Gait Distance (Feet): 800 Feet Assistive device: None Gait Pattern/deviations: Step-through pattern Gait velocity: functional Gait velocity interpretation: >2.62 ft/sec, indicative of community ambulatory   General Gait Details: steady step-through gait   Stairs             Wheelchair Mobility     Tilt Bed    Modified Rankin (Stroke Patients Only)       Balance Overall balance assessment: Needs  assistance Sitting-balance support: Feet supported, No upper extremity supported Sitting balance-Leahy Scale: Good     Standing balance support: No upper extremity supported, During functional activity Standing balance-Leahy Scale: Good                              Cognition Arousal: Alert Behavior During Therapy: WFL for tasks assessed/performed Overall Cognitive Status: Within Functional Limits for tasks assessed                                          Exercises      General Comments General comments (skin integrity, edema, etc.): tachy to 135 with ambulation, otherwise VSS      Pertinent Vitals/Pain Pain Assessment Pain Assessment: Faces Faces Pain Scale: Hurts little more Pain Location: L ribs Pain Descriptors / Indicators: Sore Pain Intervention(s): Monitored during session    Home Living                          Prior Function            PT Goals (current goals can now be found in the care plan section) Acute Rehab PT Goals Patient Stated Goal: to walk without pain Progress towards PT goals: Progressing toward goals    Frequency    Min 1X/week      PT Plan      Co-evaluation  AM-PAC PT "6 Clicks" Mobility   Outcome Measure  Help needed turning from your back to your side while in a flat bed without using bedrails?: None Help needed moving from lying on your back to sitting on the side of a flat bed without using bedrails?: None Help needed moving to and from a bed to a chair (including a wheelchair)?: None Help needed standing up from a chair using your arms (e.g., wheelchair or bedside chair)?: None Help needed to walk in hospital room?: None Help needed climbing 3-5 steps with a railing? : A Little 6 Click Score: 23    End of Session   Activity Tolerance: Patient tolerated treatment well Patient left: in bed;with call bell/phone within reach Nurse Communication: Mobility status PT  Visit Diagnosis: Difficulty in walking, not elsewhere classified (R26.2);Pain     Time: 1510-1535 PT Time Calculation (min) (ACUTE ONLY): 25 min  Charges:    $Gait Training: 8-22 mins $Therapeutic Activity: 8-22 mins PT General Charges $$ ACUTE PT VISIT: 1 Visit                     Arlyss Gandy, PT, DPT Acute Rehabilitation Office [phone]    Arlyss Gandy 08/06/2023, 3:48 PM

## 2023-08-06 NOTE — Progress Notes (Signed)
ANTICOAGULATION CONSULT NOTE - Consult  Pharmacy Consult for warfarin Indication:  LVAD  Allergies  Allergen Reactions   Other Anaphylaxis    Tree Nuts   Peanuts [Peanut Oil] Anaphylaxis    Patient Measurements: Height: 5\' 7"  (170.2 cm) Weight: 66.5 kg (146 lb 9.7 oz) IBW/kg (Calculated) : 66.1   Vital Signs: Temp: 98.5 F (36.9 C) (08/14 1100) Temp Source: Oral (08/14 1100) BP: 75/63 (08/14 1300) Pulse Rate: 112 (08/14 1300)  Labs: Recent Labs    08/04/23 0343 08/05/23 0500 08/06/23 0521  HGB 12.1* 13.0 13.5  HCT 38.2* 39.2 40.3  PLT 282 351 415*  LABPROT 30.3* 27.0* 26.0*  INR 2.9* 2.5* 2.4*  CREATININE 0.87 0.79 0.99    Estimated Creatinine Clearance: 96.4 mL/min (by C-G formula based on SCr of 0.99 mg/dL).   Medical History: Past Medical History:  Diagnosis Date   Acid reflux    ADHD (attention deficit hyperactivity disorder)    Asthma    Back pain    Seizures (HCC)    Resolved     Assessment: 37yom s/p LVAD started on warfarin post op. No AC PTA.   INR now down to 2.4 (warfarin on hold since 8/9, received FFP 8/10). Hgb stable 13, plt stable 351, LDH 300s (stable). LFTs WNL, total bilirubin now WNL. Taking ensure with oral intake improving slightly.   Goal of Therapy:  INR goal 2-2.5 Monitor platelets by anticoagulation protocol: Yes   Plan:  warfarin 0.5 mg once tonight - repeat  Daily INR, CBC and hepatic function   Leota Sauers Pharm.D. CPP, BCPS Clinical Pharmacist [phone] 08/06/2023 2:49 PM   Please check AMION for all [facility] phone numbers After 10:00 PM, call Main Pharmacy [phone]

## 2023-08-06 NOTE — Discharge Summary (Addendum)
Advanced Heart Failure Team  Discharge Summary   Patient ID: Joel York MRN: [id number], DOB/AGE: Jan 07, 1986 37 y.o. Admit date: 07/14/2023 D/C date:     08/08/2023   Primary Discharge Diagnoses:  S/P HM III LVAD Acute Biventricular HF---> Cardiogenic shock Chest Pressure PVCs AKI  Tobacco Abuse H/O Drug Abuse Leukocytosis  L Pleural Effusion.     Hospital Course:  Mr Joel York is 37 y.o. male with history of HTN, ADD, asthma, seizures, hx drug abuse now on suboxone.   Admitted on 07/14/23 with acute biventricular systolic HF w/ low-output. Echo with EF < 20% and RV reduced. Initial CO-OX 30% and was started on inotrope support with milrinone and NE.  Had frequent PVCs which were treated with mexiletine. Diuresed with IV lasix. Normal coronaries on LHC. cMRI with LVEF 10% and RVEF 17%. No evidence of infiltrative disease or myocarditis. Had reduced cardiac output on RHC despite milrinone and NE. Case discussed with [facility] team, not a candidate for transplant d/t recent smoking. LVAD felt to be his only option. CT surgery consulted.   He underwent HM III LVAD placement on 07/30/23. Extubated on post op day 1. Pressors/inotropes slowly weaned off. Post-op had some trouble with pain control and anxiety. Treated empirically for possible pneumonia d/t leukocytosis and low-grade fever. Otherwise, did well. Ramp echo 08/14 with speed optimized. INR/LDH followed daily. Pharmacy adjusted coumadin as indicated.   On the day of discharge CXR showed left pleural effusion. S/P Thoracentesis with 450 cc removed. He will need repeat CXR in 2 weeks. At the time discharge he was tolerating a regular diet and pain was controlled. He will continue with gabapentin, tramadol, trazodone, and lidocaine patches. Plan to gradually wean off. Continue suboxone.   Had leukocytosis. Lines/foley removed. Placed on 14 day course of doxycycline. Med provided by [facility] pharmacy.   See below for detailed problems  list. He will contine to be followed closely in the VAD/HF clinic.   HeartMate III LVAD:   Flow 4.4liters/min, speed 5600, power 4, PI 2.1 . Rare PI events     1. S/P HMIII LVAD  on 8/7 - Continue VAD speed at 5600. Ramp echo 08/14 - no changes made. LDH stable INR 1.9. WBC 11.9 - MAPs stable. Continue losartan 25 mg BID. - Continue gabapentin + ultram + lidocaine patches.   - Continue SSRI and Trazodone to help with sleep - VAD education completed with him and his wife.    2. Acute biventricular systolic heart failure >> cardiogenic shock - Admitted NYHA IV symptoms .Echo EF <20%,  RV mildly reduced, RV mod enlarged, estimated RV systolic pressure 53.51mmHg, LA mod dilated, RA mod dilated, mild-mod MR/TR - Etiology uncertain. ? 2/2 PVCs vs genetically mediated +/- hypertension.  - LHC with normal coronaries. RHC 07/24: Low CO (Fick CO/CI 3.3/1.9) and PAPi 3.0 on milrinone 0.375 . Off NE - cMRI:  LV markedly dilated EF 10% RV 17% NICM - Limited echo 07/29: LVEF < 20%, RV function improved. - Discussed with [facility] not a candidate for transplant with smoking.  - S/P HMIII. CO-OX 68% - Volume status ok - Continue digoxin 0.125 mg daily - MAPs 70s. Continue losartan 25 mg BID - INR 1.9 today  Warfarin dosing per PharmD.  - LDH stable.    3. Chest pressure - HsTrop 39>43>37>40, d/t demand ischemia - No CAD on cath - No s/s angina   4. PVCs - snores, will need eventual sleep study  - Suppressed with Mexiletine 200  mg BID - Keep K>4 and Mg >2   5. AKI - resolved.   6. Tobacco use - smokes 3-6 cigarettes / day - cessation discussed   7. Hx drug abuse - Continue suboxone - minimize narcotics   8. Leukocytosis - WBC 1down to 11.9 today -AF. BC X 2 NGTD.  - On empiric course of po doxy. Completing 14 days.  - Use incentive spirometer.  9. Left Pleural Effusion  S/P Thoracentesis  WIll need CXR in 2 weeks.   Discharge Vitals: Blood pressure (!) 86/55, pulse (!) 114,  temperature 98.5 F (36.9 C), temperature source Oral, resp. rate 15, height 5\' 7"  (1.702 m), weight 65.4 kg, SpO2 93%.  Labs: Lab Results  Component Value Date   WBC 11.9 (H) 08/08/2023   HGB 11.4 (L) 08/08/2023   HCT 33.9 (L) 08/08/2023   MCV 88.1 08/08/2023   PLT 493 (H) 08/08/2023    Recent Labs  Lab 08/07/23 0441  NA 131*  K 3.8  CL 99  CO2 24  BUN 14  CREATININE 0.68  CALCIUM 8.2*  PROT 6.6  BILITOT 0.4  ALKPHOS 80  ALT 68*  AST 115*  GLUCOSE 110*   Lab Results  Component Value Date   CHOL 74 07/15/2023   HDL 16 (L) 07/15/2023   LDLCALC 30 07/15/2023   TRIG 140 07/15/2023   BNP (last 3 results) Recent Labs    07/14/23 0101 07/31/23 0428 08/05/23 2320  BNP 1,810.8*  2,494.6* 487.2* 483.2*    ProBNP (last 3 results) No results for input(s): "PROBNP" in the last 8760 hours.   Diagnostic Studies/Procedures   IR THORACENTESIS ASP PLEURAL SPACE W/IMG GUIDE  Result Date: 08/08/2023 INDICATION: 37 year old male with recent insertion of a implantable left ventricular assistive device on 07/30/2023 presents with left pleural effusion. Request for therapeutic thoracentesis. EXAM: ULTRASOUND GUIDED LEFT THORACENTESIS MEDICATIONS: 10 mL 1% lidocaine COMPLICATIONS: None immediate. PROCEDURE: An ultrasound guided thoracentesis was thoroughly discussed with the patient and questions answered. The benefits, risks, alternatives and complications were also discussed. The patient understands and wishes to proceed with the procedure. Written consent was obtained. Ultrasound was performed to localize and mark an adequate pocket of fluid in the left chest. The area was then prepped and draped in the normal sterile fashion. 1% Lidocaine was used for local anesthesia. Under ultrasound guidance a 6 Fr Safe-T-Centesis catheter was introduced. Thoracentesis was performed. Patient developed significant left-sided chest pain during the procedure, the thoracentesis was terminated after  removing 450 mL of pleural effusion due to the pain. The catheter was removed and a dressing applied. FINDINGS: A total of approximately 450 mL of red colored fluid was removed. Post procedure chest X-ray reviewed, negative for pneumothorax. IMPRESSION: Successful ultrasound guided left thoracentesis yielding 450 mL of pleural fluid. Performed by: Lawernce Ion, PA-C Electronically Signed   By: Gilmer Mor D.O.   On: 08/08/2023 10:18   DG Chest 1 View  Result Date: 08/08/2023 CLINICAL DATA:  Left pleural effusion, status post thoracentesis. EXAM: CHEST  1 VIEW COMPARISON:  08/08/2023 at 5:09 a.m. FINDINGS: Interval left thoracentesis. No pneumothorax or complicating feature. There is a persistent left pleural effusion obscuring the left heart border and diaphragm. Launch due to and appearance of the left ventricular cyst device and right PICC line. The PICC line tip terminates at the cavoatrial junction. Subsegmental atelectasis or scarring along the right hemidiaphragm. Mild enlargement of the cardiopericardial silhouette. IMPRESSION: 1. Interval left thoracentesis without pneumothorax or complicating feature. 2.  Persistent left pleural effusion. 3. Stable prominence of the cardiopericardial silhouette. Electronically Signed   By: Gaylyn Rong M.D.   On: 08/08/2023 10:11   DG Chest 2 View  Result Date: 08/08/2023 CLINICAL DATA:  LVAD (left ventricular assist device) present EXAM: CHEST - 2 VIEW COMPARISON:  CXR 08/07/23 FINDINGS: Right arm PICC with tip at the cavoatrial junction. LVAD device in place. Status post median sternotomy. Compared to prior exam there is a new moderate left-sided pleural effusion. There is superimposed atelectasis. No radiographically apparent displaced rib fractures. Visualized upper abdomen is notable for colonic gaseous distention. Mild vertebral body height loss in the midthoracic spine, unchanged compared to 07/18/2023 CT chest abdomen and pelvis. Surgical clips in the  right upper quadrant. IMPRESSION: New moderate left-sided pleural effusion with superimposed atelectasis. Electronically Signed   By: Lorenza Cambridge M.D.   On: 08/08/2023 08:25   DG Chest Port 1 View  Result Date: 08/07/2023 CLINICAL DATA:  [id number] LVAD (left ventricular assist device) present [facility]) [id number] EXAM: PORTABLE CHEST 1 VIEW COMPARISON:  08/05/2023 FINDINGS: Right PICC line remains in place terminating at the level of the right atrium. Unchanged positioning of left tracheal ir assist device. Stable cardiomegaly status post sternotomy. Progressive opacification at the left lung base, likely a combination of pleural effusion and atelectasis or consolidation. Similar mild right basilar atelectasis. No pneumothorax. IMPRESSION: Progressive opacification at the left lung base, likely a combination of pleural effusion and atelectasis or consolidation. Electronically Signed   By: Duanne Guess D.O.   On: 08/07/2023 10:31    Discharge Medications   Allergies as of 08/08/2023       Reactions   Other Anaphylaxis   Tree Nuts   Peanuts [peanut Oil] Anaphylaxis        Medication List     TAKE these medications    acetaminophen 325 MG tablet Commonly known as: TYLENOL Take 2 tablets (650 mg total) by mouth every 4 (four) hours as needed for headache or mild pain.   albuterol 108 (90 Base) MCG/ACT inhaler Commonly known as: VENTOLIN HFA Inhale 2 puffs into the lungs every 6 (six) hours as needed for wheezing or shortness of breath. Shortness of breath   aspirin EC 81 MG tablet Take 1 tablet (81 mg total) by mouth daily. Swallow whole.   azelastine 0.1 % nasal spray Commonly known as: ASTELIN Place 1 spray into both nostrils 2 (two) times daily. Use in each nostril as directed   calcium carbonate 500 MG chewable tablet Commonly known as: TUMS - dosed in mg elemental calcium Chew 1 tablet by mouth daily as needed for indigestion.   digoxin 0.125 MG tablet Commonly known as:  LANOXIN Take 1 tablet (0.125 mg total) by mouth daily.   docusate sodium 100 MG capsule Commonly known as: COLACE Take 2 capsules (200 mg total) by mouth daily for 5 days then as directed by MD   doxycycline 100 MG tablet Commonly known as: VIBRA-TABS Take 1 tablet (100 mg total) by mouth every 12 (twelve) hours for 14 days.   fluticasone 50 MCG/ACT nasal spray Commonly known as: FLONASE Place 1 spray into both nostrils daily. What changed:  when to take this reasons to take this   Fluticasone-Salmeterol 100-50 MCG/DOSE Aepb Commonly known as: ADVAIR Inhale 1 puff into the lungs every 12 (twelve) hours. What changed:  when to take this reasons to take this   FT Pain Relief Max Strength 4 % Generic drug: lidocaine Place 2 patches onto  the skin daily. Remove & Discard patch within 12 hours or as directed by MD   gabapentin 300 MG capsule Commonly known as: NEURONTIN Take 1 capsule (300 mg total) by mouth 3 (three) times daily.   ipratropium 0.03 % nasal spray Commonly known as: ATROVENT Place 2 sprays into both nostrils 2 (two) times daily. What changed:  when to take this reasons to take this   levocetirizine 5 MG tablet Commonly known as: XYZAL Take 1 tablet (5 mg total) by mouth every evening. What changed:  when to take this reasons to take this   losartan 25 MG tablet Commonly known as: COZAAR Take 1 tablet (25 mg total) by mouth 2 (two) times daily.   mexiletine 200 MG capsule Commonly known as: MEXITIL Take 1 capsule (200 mg total) by mouth 2 (two) times daily.   nitroGLYCERIN 0.4 MG SL tablet Commonly known as: NITROSTAT Place 1 tablet (0.4 mg total) under the tongue every 5 (five) minutes as needed for chest pain.   ondansetron 4 MG disintegrating tablet Commonly known as: ZOFRAN-ODT Take 1 tablet (4 mg total) by mouth every 8 (eight) hours as needed for nausea or vomiting.   pantoprazole 40 MG tablet Commonly known as: PROTONIX Take 1 tablet  (40 mg total) by mouth daily.   sertraline 25 MG tablet Commonly known as: ZOLOFT Take 1 tablet (25 mg total) by mouth daily.   Suboxone 8-2 MG Film Generic drug: Buprenorphine HCl-Naloxone HCl Place 1 Film under the tongue daily.   traMADol 50 MG tablet Commonly known as: ULTRAM Take 1-2 tablets (50-100 mg total) by mouth every 6 (six) hours as needed for moderate pain.   traZODone 50 MG tablet Commonly known as: DESYREL Take 1 tablet (50 mg total) by mouth at bedtime as needed for sleep.   warfarin 1 MG tablet Commonly known as: COUMADIN Take 1 tablet (1 mg total) by mouth daily at 4 PM.        Disposition   The patient will be discharged in stable condition to home. Discharge Instructions     Amb Referral to Cardiac Rehabilitation   Complete by: As directed    Diagnosis: Heart Failure (see criteria below if ordering Phase II)   Heart Failure Type: Chronic Systolic   After initial evaluation and assessments completed: Virtual Based Care may be provided alone or in conjunction with Phase 2 Cardiac Rehab based on patient barriers.: Yes   Intensive Cardiac Rehabilitation (ICR) [facility] location only OR Traditional Cardiac Rehabilitation (TCR) *If criteria for ICR are not met will enroll in TCR [facility] only): Yes   Diet - low sodium heart healthy   Complete by: As directed    Heart Failure patients record your daily weight using the same scale at the same time of day   Complete by: As directed    INR  Goal: 2 - 2.5   Complete by: As directed    Goal: 2 - 2.5   Increase activity slowly   Complete by: As directed    Page VAD Coordinator at [phone]  Notify for: any VAD alarms, sustained elevations of power >10 watts, sustained drop in Pulse Index <3   Complete by: As directed    Notify for:  any VAD alarms sustained elevations of power >10 watts sustained drop in Pulse Index <3     Speed Settings:   Complete by: As directed    Fixed 5600 RPM Low 5300 RPM        Follow-up  Information     [facility]. Go in 55 day(s).   Specialty: Internal Medicine Why: Monday, September 15, 2023 @ 1pm  PLEASE BRING ID  [facility] insurance care Contact information: [address] [place] [address] [phone]        [facility] Follow up on 08/14/2023.   Specialty: Cardiology Why: at 900 Contact information: [address] [place] [address] [phone]                  Duration of Discharge Encounter: Greater than 35 minutes   Signed, Amy Clegg NP-C  08/08/2023, 2:15 PM   Agree with above.  [name], MD  11:11 PM

## 2023-08-06 NOTE — Progress Notes (Addendum)
LVAD Coordinator Rounding Note:  Admitted 07/14/23 from ED due to worsening HF symptoms.   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  Pt laying in bed. States "today is much better than yesterday." States pain is well controlled (4/10) this morning. Panic attacks improved with starting Zoloft 25 mg daily and Trazodone  50 mg q HS.   WBC 14.6 today. Afebrile. Encourage to get OOB, ambulate, and use IS today. He verbalized understanding. Provider team started prophylactic Doxycycline 100 mg BID x 7 days today. Blood cxs & sputum cx pending.   Ramp echo completed this morning. No changes made.   Pacing wires remain in place.   Will plan to complete discharge teaching tomorrow with pt and wife.  Vital signs: Temp: 98.5 HR: 115 Doppler Pressure: 74 Auto BP: 94/80 (87) O2 Sat: 93% on RA Wt: 150.3>154.3>161.3>146.3>133.4>146.6 lbs     LVAD interrogation reveals:  Speed: 5600 Flow: 4.6 Power: 4.3 w PI: 3.1  Alarms: none Events: rare Hematocrit: 39  Fixed speed: 5500 Low speed limit: 5200  Drive Line: Dressing clean, dry, and intact. Anchor correctly applied. Continue every other day dressing changes. Next dressing change due Thursday 08/07/23 by VAD coordinator or nurse champion only.   Labs:  LDH trend: 427>487>292>316>259  INR trend: 1.4>1.5>2.9>2.5>2.4  WBC trend: 10.3>15.1>14.6  Anticoagulation Plan: -INR Goal: 2-2.5 -ASA Dose: 325  Blood Products:  Intra op: 2 FFP 450 Cell Saver  Arrythmias:   Infection:  08/06/23>>blood cxs>> in process 08/06/23>>sputum cx>>  Renal:  -BUN/CRT: 16/1.09>10/1.07>10/0.87>13/0.79>14/0.99  Gtts: Milrinone 0.125 mcg/kg/min-- off 8/13   Patient Education: Will plan to complete remainder of discharge teaching with pt and wife tomorrow.  Adverse Events on VAD: -  Plan/Recommendations:  1. Continue every other day dressing changes by VAD coordinator or Nurse Alla Feeling 2. Please page VAD coordinator for any  alarms or VAD equipment issues.  Alyce Pagan RN VAD Coordinator  Office: [phone]  24/7 Pager: [phone]

## 2023-08-06 NOTE — Plan of Care (Signed)
  Problem: Education: Goal: Knowledge of General Education information will improve Description: Including pain rating scale, medication(s)/side effects and non-pharmacologic comfort measures Outcome: Progressing   Problem: Health Behavior/Discharge Planning: Goal: Ability to manage health-related needs will improve Outcome: Progressing   Problem: Clinical Measurements: Goal: Ability to maintain clinical measurements within normal limits will improve Outcome: Progressing Goal: Will remain free from infection Outcome: Progressing Goal: Diagnostic test results will improve Outcome: Progressing Goal: Respiratory complications will improve Outcome: Progressing Goal: Cardiovascular complication will be avoided Outcome: Progressing   Problem: Activity: Goal: Risk for activity intolerance will decrease Outcome: Progressing   Problem: Nutrition: Goal: Adequate nutrition will be maintained Outcome: Progressing   Problem: Coping: Goal: Level of anxiety will decrease Outcome: Progressing   Problem: Pain Managment: Goal: General experience of comfort will improve Outcome: Progressing   Problem: Safety: Goal: Ability to remain free from injury will improve Outcome: Progressing   Problem: Skin Integrity: Goal: Risk for impaired skin integrity will decrease Outcome: Progressing   Problem: Education: Goal: Ability to demonstrate management of disease process will improve Outcome: Progressing Goal: Ability to verbalize understanding of medication therapies will improve Outcome: Progressing   Problem: Activity: Goal: Capacity to carry out activities will improve Outcome: Progressing   Problem: Cardiac: Goal: Ability to achieve and maintain adequate cardiopulmonary perfusion will improve Outcome: Progressing   Problem: Activity: Goal: Ability to return to baseline activity level will improve Outcome: Progressing   Problem: Cardiovascular: Goal: Ability to achieve and  maintain adequate cardiovascular perfusion will improve Outcome: Progressing Goal: Vascular access site(s) Level 0-1 will be maintained Outcome: Progressing   Problem: Health Behavior/Discharge Planning: Goal: Ability to safely manage health-related needs after discharge will improve Outcome: Progressing   Problem: Education: Goal: Knowledge of the prescribed therapeutic regimen will improve Outcome: Progressing   Problem: Activity: Goal: Risk for activity intolerance will decrease Outcome: Progressing   Problem: Cardiac: Goal: Ability to maintain an adequate cardiac output will improve Outcome: Progressing   Problem: Coping: Goal: Level of anxiety will decrease Outcome: Progressing   Problem: Fluid Volume: Goal: Risk for excess fluid volume will decrease Outcome: Progressing   Problem: Clinical Measurements: Goal: Ability to maintain clinical measurements within normal limits will improve Outcome: Progressing Goal: Will remain free from infection Outcome: Progressing   Problem: Respiratory: Goal: Will regain and/or maintain adequate ventilation Outcome: Progressing

## 2023-08-07 ENCOUNTER — Inpatient Hospital Stay (HOSPITAL_COMMUNITY): Payer: Medicaid Other

## 2023-08-07 DIAGNOSIS — R570 Cardiogenic shock: Secondary | ICD-10-CM | POA: Diagnosis not present

## 2023-08-07 DIAGNOSIS — I5021 Acute systolic (congestive) heart failure: Secondary | ICD-10-CM | POA: Diagnosis not present

## 2023-08-07 DIAGNOSIS — I5082 Biventricular heart failure: Secondary | ICD-10-CM | POA: Diagnosis not present

## 2023-08-07 DIAGNOSIS — I517 Cardiomegaly: Secondary | ICD-10-CM | POA: Diagnosis not present

## 2023-08-07 DIAGNOSIS — R918 Other nonspecific abnormal finding of lung field: Secondary | ICD-10-CM | POA: Diagnosis not present

## 2023-08-07 DIAGNOSIS — J9811 Atelectasis: Secondary | ICD-10-CM | POA: Diagnosis not present

## 2023-08-07 DIAGNOSIS — Z95811 Presence of heart assist device: Secondary | ICD-10-CM | POA: Diagnosis not present

## 2023-08-07 LAB — CBC WITH DIFFERENTIAL/PLATELET
Abs Immature Granulocytes: 0.12 10*3/uL — ABNORMAL HIGH (ref 0.00–0.07)
Basophils Absolute: 0.1 10*3/uL (ref 0.0–0.1)
Basophils Relative: 0 %
Eosinophils Absolute: 0.5 10*3/uL (ref 0.0–0.5)
Eosinophils Relative: 3 %
HCT: 37.1 % — ABNORMAL LOW (ref 39.0–52.0)
Hemoglobin: 12.2 g/dL — ABNORMAL LOW (ref 13.0–17.0)
Immature Granulocytes: 1 %
Lymphocytes Relative: 11 %
Lymphs Abs: 1.6 10*3/uL (ref 0.7–4.0)
MCH: 29 pg (ref 26.0–34.0)
MCHC: 32.9 g/dL (ref 30.0–36.0)
MCV: 88.3 fL (ref 80.0–100.0)
Monocytes Absolute: 2.1 10*3/uL — ABNORMAL HIGH (ref 0.1–1.0)
Monocytes Relative: 14 %
Neutro Abs: 10.5 10*3/uL — ABNORMAL HIGH (ref 1.7–7.7)
Neutrophils Relative %: 71 %
Platelets: 433 10*3/uL — ABNORMAL HIGH (ref 150–400)
RBC: 4.2 MIL/uL — ABNORMAL LOW (ref 4.22–5.81)
RDW: 14.6 % (ref 11.5–15.5)
WBC: 14.8 10*3/uL — ABNORMAL HIGH (ref 4.0–10.5)
nRBC: 0 % (ref 0.0–0.2)

## 2023-08-07 LAB — COMPREHENSIVE METABOLIC PANEL
ALT: 68 U/L — ABNORMAL HIGH (ref 0–44)
AST: 115 U/L — ABNORMAL HIGH (ref 15–41)
Albumin: 2.4 g/dL — ABNORMAL LOW (ref 3.5–5.0)
Alkaline Phosphatase: 80 U/L (ref 38–126)
Anion gap: 8 (ref 5–15)
BUN: 14 mg/dL (ref 6–20)
CO2: 24 mmol/L (ref 22–32)
Calcium: 8.2 mg/dL — ABNORMAL LOW (ref 8.9–10.3)
Chloride: 99 mmol/L (ref 98–111)
Creatinine, Ser: 0.68 mg/dL (ref 0.61–1.24)
GFR, Estimated: >60 mL/min (ref >60)
Glucose, Bld: 110 mg/dL — ABNORMAL HIGH (ref 70–99)
Potassium: 3.8 mmol/L (ref 3.5–5.1)
Sodium: 131 mmol/L — ABNORMAL LOW (ref 135–145)
Total Bilirubin: 0.4 mg/dL (ref 0.3–1.2)
Total Protein: 6.6 g/dL (ref 6.5–8.1)

## 2023-08-07 LAB — PROTIME-INR
INR: 2.3 — ABNORMAL HIGH (ref 0.8–1.2)
Prothrombin Time: 25.7 s — ABNORMAL HIGH (ref 11.4–15.2)

## 2023-08-07 LAB — COOXEMETRY PANEL
Carboxyhemoglobin: 2.2 % — ABNORMAL HIGH (ref 0.5–1.5)
Methemoglobin: <0.7 % (ref 0.0–1.5)
O2 Saturation: 70.2 %
Total hemoglobin: 12.8 g/dL (ref 12.0–16.0)

## 2023-08-07 LAB — MAGNESIUM: Magnesium: 1.9 mg/dL (ref 1.7–2.4)

## 2023-08-07 LAB — LACTATE DEHYDROGENASE: LDH: 251 U/L — ABNORMAL HIGH (ref 98–192)

## 2023-08-07 MED ORDER — WARFARIN 0.5 MG HALF TABLET
0.5000 mg | ORAL_TABLET | Freq: Every day | ORAL | Status: DC
  Administered 2023-08-07: 0.5 mg via ORAL
  Filled 2023-08-07 (×2): qty 1

## 2023-08-07 MED ORDER — SORBITOL 70 % SOLN
30.0000 mL | Freq: Once | Status: AC
  Administered 2023-08-07: 30 mL via ORAL
  Filled 2023-08-07: qty 30

## 2023-08-07 MED ORDER — SIMETHICONE 80 MG PO CHEW
80.0000 mg | CHEWABLE_TABLET | Freq: Four times a day (QID) | ORAL | Status: DC | PRN
  Administered 2023-08-07: 80 mg via ORAL

## 2023-08-07 MED ORDER — POLYETHYLENE GLYCOL 3350 17 G PO PACK
17.0000 g | PACK | Freq: Two times a day (BID) | ORAL | Status: DC
  Administered 2023-08-07: 17 g via ORAL
  Filled 2023-08-07: qty 1

## 2023-08-07 NOTE — Progress Notes (Addendum)
Advanced Heart Failure VAD Team Note  PCP-Cardiologist: None   Subjective:   8/7 S/P HMIII LVAD 8/8 Extubated.   CO-OX stable off inotrope support.  MAP mostly 70s  INR 2.3  Pain improving each day. Walked 800 feet with PT yesterday  Removing EPW today  LVAD INTERROGATION:  HeartMate III LVAD:   Flow 4.2  liters/min, speed 5600, power 4, PI 3.6  Objective:    Vital Signs:   Temp:  [98.3 F (36.8 C)-99.1 F (37.3 C)] 98.5 F (36.9 C) (08/15 0818) Pulse Rate:  [110-126] 110 (08/15 0444) BP: (75-126)/(58-99) 101/83 (08/15 0444) SpO2:  [91 %-94 %] 92 % (08/15 0723) Weight:  [65.4 kg] 65.4 kg (08/15 0500) Last BM Date : 08/04/23 Mean arterial Pressure 70s  Intake/Output:   Intake/Output Summary (Last 24 hours) at 08/07/2023 0919 Last data filed at 08/07/2023 0600 Gross per 24 hour  Intake 980 ml  Output 1400 ml  Net -420 ml     Physical Exam    Physical Exam: GENERAL: Well appearing HEENT: normal  NECK: Supple, JVP not elevated.  2+ bilaterally, no bruits.   CARDIAC:  Mechanical heart sounds with LVAD hum present.  LUNGS:  Clear to auscultation bilaterally.  ABDOMEN:  Soft, round, nontender, positive bowel sounds x4.     LVAD exit site:Dressing dry and intact.  No erythema or drainage.  Stabilization device present and accurately applied.  EXTREMITIES:  Warm and dry, no cyanosis, clubbing, rash or edema  NEUROLOGIC:  Alert and oriented x 4.  Affect pleasant.         Telemetry   ST 100s-110s  Labs   Basic Metabolic Panel: Recent Labs  Lab 08/02/23 0336 08/03/23 0343 08/03/23 0445 08/04/23 0343 08/05/23 0500 08/06/23 0521 08/07/23 0441  NA 134* 136 136 133* 131* 132* 131*  K 3.7 3.5 3.5 4.1 4.2 4.2 3.8  CL 95* 99  --  99 99 96* 99  CO2 28 26  --  25 21* 22 24  GLUCOSE 119* 95  --  101* 117* 133* 110*  BUN 6 8  --  10 13 14 14   CREATININE 1.03 0.91  --  0.87 0.79 0.99 0.68  CALCIUM 8.4* 8.3*  --  8.2* 8.3* 8.5* 8.2*  MG 1.7 1.7  --  2.0  1.8 1.9 1.9  PHOS 1.5* 2.7  --  3.2 3.1 3.0  --     Liver Function Tests: Recent Labs  Lab 08/03/23 0343 08/04/23 0343 08/05/23 0500 08/06/23 0521 08/07/23 0441  AST 32 22 23 47* 115*  ALT 19 16 16 29  68*  ALKPHOS 70 70 70 82 80  BILITOT 0.5 0.4 0.8 0.5 0.4  PROT 6.2* 6.2* 6.4* 6.7 6.6  ALBUMIN 2.8* 2.8* 2.7* 2.7* 2.4*   No results for input(s): "LIPASE", "AMYLASE" in the last 168 hours. No results for input(s): "AMMONIA" in the last 168 hours.  CBC: Recent Labs  Lab 08/03/23 0343 08/03/23 0445 08/04/23 0343 08/05/23 0500 08/06/23 0521 08/07/23 0441  WBC 9.7  --  10.3 15.1* 14.6* 14.8*  NEUTROABS 6.5  --  7.0 11.2* 10.2* 10.5*  HGB 12.3* 12.2* 12.1* 13.0 13.5 12.2*  HCT 36.7* 36.0* 38.2* 39.2 40.3 37.1*  MCV 90.2  --  89.7 86.3 87.8 88.3  PLT 248  --  282 351 415* 433*    INR: Recent Labs  Lab 08/03/23 0343 08/04/23 0343 08/05/23 0500 08/06/23 0521 08/07/23 0441  INR 3.6* 2.9* 2.5* 2.4* 2.3*    Other results:  Imaging   ECHOCARDIOGRAM LIMITED  Result Date: 08/06/2023    ECHOCARDIOGRAM LIMITED REPORT   Patient Name:   Joel York Date of Exam: 08/06/2023 Medical Rec #:  [id number]    Height:       67.0 in Accession #:    [id number]   Weight:       146.6 lb Date of Birth:  November 13, 1986    BSA:          1.772 m Patient Age:    37 years     BP:           94/80 mmHg Patient Gender: M            HR:           116 bpm. Exam Location:  Inpatient Procedure: Limited Echo, Color Doppler and Cardiac Doppler Indications:    RAMP LVAD  History:        Patient has prior history of Echocardiogram examinations, most                 recent 07/30/2023.  Sonographer:    Irving Burton Senior RDCS Referring Phys: [id number] Cyncere Ruhe R Triniti Gruetzmacher  Sonographer Comments: Ramp study with Dr Gala Romney at bedside IMPRESSIONS  1. Limited RAMP/LVAD study.  2. Left ventricular ejection fraction, by estimation, is <20%. The left ventricle has severely decreased function. The left ventricle demonstrates global  hypokinesis.  3. Right ventricular systolic function is severely reduced. The right ventricular size is normal.  4. Aortic valve regurgitation is trivial. FINDINGS  Left Ventricle: Left ventricular ejection fraction, by estimation, is <20%. The left ventricle has severely decreased function. The left ventricle demonstrates global hypokinesis. Right Ventricle: The right ventricular size is normal. Right ventricular systolic function is severely reduced. Right Atrium: Right atrial size was normal in size. Pericardium: There is no evidence of pericardial effusion. Tricuspid Valve: Tricuspid valve regurgitation is trivial. Aortic Valve: Aortic valve regurgitation is trivial. Additional Comments: Limited RAMP/LVAD study. Spectral Doppler performed. Color Doppler performed.  Olga Millers MD Electronically signed by Olga Millers MD Signature Date/Time: 08/06/2023/12:36:19 PM    Final      Medications:     Scheduled Medications:  aspirin EC  81 mg Oral Daily   bisacodyl  10 mg Oral Daily   buprenorphine-naloxone  1 tablet Sublingual Daily   Chlorhexidine Gluconate Cloth  6 each Topical Daily   digoxin  0.125 mg Oral Daily   docusate sodium  200 mg Oral Daily   doxycycline  100 mg Oral Q12H   gabapentin  300 mg Oral TID   lidocaine  2 patch Transdermal Q24H   loratadine  10 mg Oral QPM   losartan  25 mg Oral BID   melatonin  3 mg Oral QHS   mexiletine  200 mg Oral BID   mometasone-formoterol  2 puff Inhalation BID   montelukast  10 mg Oral q morning   pantoprazole  40 mg Oral Daily   polyethylene glycol  17 g Oral BID   senna-docusate  2 tablet Oral QHS   sertraline  25 mg Oral Daily   sodium chloride flush  10-40 mL Intracatheter Q12H   sodium chloride flush  10-40 mL Intracatheter Q12H   sodium chloride flush  3 mL Intravenous Q12H   sorbitol  30 mL Oral Once   warfarin  0.5 mg Oral q1600   Warfarin - Pharmacist Dosing Inpatient   Does not apply q1600    Infusions:  sodium chloride  Stopped (08/03/23 1743)  PRN Medications: acetaminophen, cyclobenzaprine, fluticasone, hydrALAZINE, levalbuterol, LORazepam, nitroGLYCERIN, ondansetron (ZOFRAN) IV, mouth rinse, oxyCODONE, simethicone, sodium chloride flush, traMADol, traZODone   Patient Profile   Joel York is a 37 y.o. male with HTN, ADD, asthma, hx drug abuse (now on suboxone x5 yrs), and seizures. Admitted with acute systolic heart failure. NYHA IV -> shock.   S/P HMIII LVAD   Assessment/Plan:    1. S/P HMIII LVAD  on 8/7 - Continue VAD speed at 5600. Ramp echo 08/14 - no changes made. - CO-OX stable off milrinone - MAPs stable. Continue losartan 25 mg BID. - Pain control better. Continue gabapentin. Have been minimizing oxycodone and using tramadol.  - Panic attacks at night. Started SSRI and Trazodone to help with sleep. D/C Ativan - Continue to mobilize  2. Acute biventricular systolic heart failure >> cardiogenic shock - Admitted NYHA IV symptoms .Echo EF <20%,  RV mildly reduced, RV mod enlarged, estimated RV systolic pressure 53.57mmHg, LA mod dilated, RA mod dilated, mild-mod MR/TR - Etiology uncertain. ? 2/2 PVCs vs genetically mediated +/- hypertension.  - LHC with normal coronaries. RHC 07/24: Low CO (Fick CO/CI 3.3/1.9) and PAPi 3.0 on milrinone 0.375 . Off NE - cMRI:  LV markedly dilated EF 10% RV 17% NICM - Limited echo 07/29: LVEF < 20%, RV function improved. - Discussed with [facility] not a candidate for transplant with smoking.  - S/P HMIII - Volume status ok - Continue digoxin 0.125 mg daily - MAPs improved. Continue losartan 25 mg BID - INR 2.3. Warfarin dosing per PharmD.  - LDH stable  3. Chest pressure - HsTrop 39>43>37>40, d/t demand ischemia - No CAD on cath - No s/s angina   4. PVCs - snores, will need eventual sleep study  - Suppressed with Mexiletine 200 mg BID - Keep K>4 and Mg >2   5. AKI - resolved.   6. Tobacco use - smokes 3-6 cigarettes / day - cessation  discussed   7. Hx drug abuse - Continue suboxone - minimize narcotics  8. Leukocytosis - WBC 10.3>>15>>14>>14.8K - AF. BC X 2 NGTD.  - On empiric course of po doxy - Use incentive spirometer.    Stable for transfer to [facility]. Possible discharge home tomorrow.  Length of Stay: 24  [name], [name], PA-C  9:19 AM  VAD Team --- VAD ISSUES ONLY--- Pager [phone] (7am - 7am)  Advanced Heart Failure Team  Pager [phone] (M-F; 7a - 5p)  Please contact [facility] for night-coverage after hours (5p -7a ) and weekends on [email or web address]  Patient seen and examined with the above-signed Advanced Practice Provider and/or Housestaff. I personally reviewed laboratory data, imaging studies and relevant notes. I independently examined the patient and formulated the important aspects of the plan. I have edited the note to reflect any of my changes or salient points. I have personally discussed the plan with the patient and/or family.  Doing well. CP controlled. Ambulating.   Ramp echo yesterday. Good pump position. Severe RV dysfunction.   Volume ok. INR 2.3  General:  NAD.  HEENT: normal  Neck: supple. JVP not elevated.  Carotids 2+ bilat; no bruits. No lymphadenopathy or thryomegaly appreciated. Cor: LVAD hum.  Lungs: Clear. Abdomen: obese soft, nontender, non-distended. No hepatosplenomegaly. No bruits or masses. Good bowel sounds. Driveline site clean. Anchor in place.  Extremities: no cyanosis, clubbing, rash. Warm no edema  Neuro: alert & oriented x 3. No focal deficits. Moves all 4 without problem   Doing well with  VAD despite severe RV dysfunction.   INR 2.3 Discussed dosing with PharmD personally.  Will continue to ambulate. Continue VAD education today.   VAD interrogated personally. Parameters stable.Hopefully home soon.   [name], MD  12:47 PM

## 2023-08-07 NOTE — TOC Progression Note (Signed)
Transition of Care [facility]) - Progression Note    Patient Details  Name: Joel York MRN: [id number] Date of Birth: 12/05/1986  [facility] CM/SW Contact  Nicanor Bake Phone Number: [phone] 08/07/2023, 11:48 AM  Clinical Narrative:  CSW met with pt at bedside. CSW and pt discussed that a new PCP follow up hospital appointment would be scheduled. CSW asked the pt if there was any equipment that he needed. Pt stated outside of the LVAD equipment he does not believe so. CSW stated that the she would follow up with the HF lead [facility] [place]. CSW will schedule the pts PCP follow up appointment. TOC will continue to follow.     Expected Discharge Plan: Home/Self Care Barriers to Discharge: Continued Medical Work up  Expected Discharge Plan and Services       Living arrangements for the past 2 months: Single Family Home                                       Social Determinants of Health (SDOH) Interventions SDOH Screenings   Food Insecurity: No Food Insecurity (07/14/2023)  Housing: Low Risk  (07/14/2023)  Transportation Needs: No Transportation Needs (07/14/2023)  Utilities: Not At Risk (07/14/2023)  Alcohol Screen: Low Risk  (07/14/2023)  Financial Resource Strain: High Risk (07/14/2023)  Physical Activity: Sufficiently Active (07/14/2023)  Stress: Stress Concern Present (07/14/2023)  Tobacco Use: High Risk (07/23/2023)    Readmission Risk Interventions     No data to display

## 2023-08-07 NOTE — Plan of Care (Signed)
  Problem: Education: Goal: Knowledge of General Education information will improve Description: Including pain rating scale, medication(s)/side effects and non-pharmacologic comfort measures Outcome: Progressing   Problem: Health Behavior/Discharge Planning: Goal: Ability to manage health-related needs will improve Outcome: Progressing   Problem: Clinical Measurements: Goal: Ability to maintain clinical measurements within normal limits will improve Outcome: Progressing Goal: Will remain free from infection Outcome: Progressing Goal: Respiratory complications will improve Outcome: Progressing Goal: Cardiovascular complication will be avoided Outcome: Progressing   Problem: Activity: Goal: Risk for activity intolerance will decrease Outcome: Progressing   Problem: Nutrition: Goal: Adequate nutrition will be maintained Outcome: Progressing   Problem: Coping: Goal: Level of anxiety will decrease Outcome: Progressing   Problem: Pain Managment: Goal: General experience of comfort will improve Outcome: Progressing   Problem: Activity: Goal: Capacity to carry out activities will improve Outcome: Progressing   Problem: Activity: Goal: Ability to return to baseline activity level will improve Outcome: Progressing

## 2023-08-07 NOTE — Progress Notes (Signed)
VAD Coordinator Note:   Existing VAD dressing removed and site care performed using sterile technique by  caregiver Penni Bombard. Drive line exit site cleaned with Chlora prep applicators x 2, allowed to dry, and silver strip with gauze applied. Exit site healing and unincorporated, the velour is fully implanted at exit site. Scant amount of serosanguinous/dried bloody drainage. No redness, tenderness, foul odor or rash noted. 1 suture in place under the driveline. Drive line anchor re-applied. Continue every other day dressing changes. Next dressing change due Saturday 08/09/23 by caregiver.   VAD Discharge Teaching:  Discharge VAD teaching completed today with pt and wife Penni Bombard.  The home inspection checklist has been reviewed and no unsafe conditions have been identified. Family reports that there are at least two dedicated grounded, 3-prong outlets with clearly labeled circuit breaker has been established in the bedroom for power module and Magazine features editor.   Both patient and caregiver have been trained on the following:  1. HM III LVAD overview of system operations  2. Overview of major lifestyle accommodations and cautions   3. Overview of system components (features and functions) 4. Changing power sources 5. Overview of alerts and alarms 6. How to identify and manage an emergency including when pump is running and when pump has stopped  7. Changing system controller 8. Maintain emergency contact list and medications  The patient and caregiver have successfully demonstrated:  1. Changing power source (from batteries to mobile power unit, mobile power unit to batteries, and replacing batteries) 2. Perform system controller self test  3. Check and charge batteries  4. Change system controller 5. Paged VAD pager and programmed number in phones  A daily flow sheet with patient  weight, temperature,  flow, speed, power, and PI, along with daily self checks on system controller and power  module have been performed by patient and caregiver during hospitalization and will also be done daily at home.   The caregiver has been trained on percutaneous lead exit site care, care of the driveline and dressing changes. She/he has performed dressing changes during patient's hospitalization under my supervision with the support of the nursing staff. The importance of lead immobilization has been stressed to patient and caregiver using the attachment device. The caregiver has successfully demonstrated the following: 1. Cleansing site with sterile technique 2. Dressing care and maintenance  3. Immobilizing driveline  The following routine activities and maintenance have been reviewed with patient and caregiver and both verbalize understanding:  1. Stressed importance of never disconnecting power from both controller power leads at the same time, and never disconnecting both batteries at the same time, or the pump will alarm and eventually stop if no power supply restored.  2. Plug the mobile power unit (MPU) and the universal battery charger (UBC) into properly grounded (3 prong) outlets dedicated to PM use. Do NOT use adapter (cheater plug) for ungrounded outlets or multiple portable socket outlets (power strips) 3. Do not connect the PM or MPU to an outlet controlled by wall switch or the device may not work 4. Transfer from MPU to batteries during [facility] mains power failure. The PM has internal backup battery that will power the pump while you transfer to batteries 5. Keep a backup system controller, charged batteries, battery clips, and flashlight near you during sleep in case of electrical power outage 6. Clean battery, battery clip, and universal battery charger contacts weekly 7. Visually inspect percutaneous lead daily 8. Check cables and connectors when changing power source  9. Rotate batteries; keep all eight batteries charged 10. Always have backup system controller, battery clips, fully  charged batteries, and spare fully charged batteries when traveling 11. Re-calibrate batteries every 70 uses; monitor battery life of 36 months or 360 uses; replace batteries at end of battery life  Identified the following changes in activities of daily living with pump:  1. No driving for at least six weeks and then only if doctor gives permission to do so 2. No tub baths while pump implanted, and shower only if doctor gives permission 3. No swimming or submersion in water while implanted with pump 4. Keep all VAD equipment away from water or moisture 5. Keep all VAD connections clean and dry 6. No contact sports or engage in jumping activities 7. Never have an MRI while implanted with the pump 8. Never leave or store batteries in extremely hot or cold places (such as   trunk of your car), or the battery life will be shortened 9. Call the doctor or hospital contact person if any change in how the pump sounds, feels, or works 10. Plan to sleep only when connected to the mobile power unit. Marland Kitchen 11. Keep a backup system controller, charged batteries, battery clips, and flashlight near you during sleep in case of electrical power outage 12. Do not sleep on your stomach 13. Talk with doctor before any long distance travel plans 14. Patient will need antibiotics prior to any dental procedure; instructed to contact VAD coordinator before any dental procedures (including routine cleaning)  Discharge binder given to patient and include the following: 1. List of emergency contacts 2. Wallet card 3. HM III Luggage tags 4. HM III Alarms for Patients and their Caregivers 5. HM III Patient Handbook 6. HM III Patient Education Program DVD 7. Daily diary sheets 8. Warfarin teaching sheets 9. Nosebleed teaching sheets 10. Medications you may and may not take with CHF list  Discharge equipment includes:  1. Two system controllers 2. One mobile power unit with attached 21 ' patient cable 3. One  universal Magazine features editor (UBC) 4. Eight fully charged batteries  5. Four battery clips 6. One travel case 7. One holster vest 8. Wearable accessory package 9. Daily dressing kits and anchors (14 daily kits;14 anchors)  Discussed frequency and importance of INR checks; emphasized importance of maintaining INR goal to prevent clotting and or bleeding issues with pump.  Patient able to answer questions and asked good questions pertaining to warfarin and diet/lifestyle changes necessary to be successful and safe.  Patient will have INR managed by VAD Clinic; current INR goal is 2.0 - 2.5.    The patient has completed a proficiency test for the HM III and all questions have been answered. The pt and family have been instructed to call if any questions, problems, or concerns arise. Pt and caregiver successfully paged VAD coordinator using VAD pager emergency number and have been instructed to use this number only for emergencies. Patient and caregiver asked appropriate questions, had good interaction with VAD coordinator, and verbalized understanding of above instructions.   Pt discharging to his house per original plan. His caregiver confirmed she will stay with him for the first 2 weeks, and as needed after initial 2- week period.  Caregiver will plan to change drive line dressing daily. Pt verbalized agreement with this plan.   Hospital f/u appointment made for next week with Dr. Gala Romney.  Simmie Davies RN,BSN VAD Coordinator  Office: [phone] 24/7 VAD Pager: [phone]

## 2023-08-07 NOTE — Progress Notes (Signed)
LVAD Coordinator Rounding Note:  Admitted 07/14/23 from ED due to worsening HF symptoms.   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  Pt laying in bed asleep. Panic attacks improved with starting Zoloft 25 mg daily and Trazodone  50 mg q HS.   WBC 14.8 today. Afebrile. Encourage to get OOB, ambulate, and use IS today. He verbalized understanding. Provider team started prophylactic Doxycycline 100 mg BID x 7 days today. Blood cxs & sputum cx pending.   Ramp echo completed yesterday. No changes made.   Pacing wires remain in place. Supposed to be removed today.  Will plan to complete discharge teaching today with pt and wife at 2pm.  Vital signs: Temp: 98.5 HR: 112 Doppler Pressure: not documented Auto BP: 93/49 (61) O2 Sat: 94% on RA Wt: 150.3>154.3>161.3>146.3>133.4>146.6>144.1 lbs     LVAD interrogation reveals:  Speed: 5600 Flow: 4.4 Power: 4.3 w PI: 2.9  Alarms: none Events: 6 today Hematocrit: 37  Fixed speed: 5600 Low speed limit: 5300  Drive Line: Dressing clean, dry, and intact. Anchor correctly applied. Continue every other day dressing changes. Next dressing change due Thursday 08/07/23 by VAD coordinator or nurse champion only. This will be completed this afternoon in education session with pts wife.  Labs:  LDH trend: 427>487>292>316>259>251  INR trend: 1.4>1.5>2.9>2.5>2.4>2.3  WBC trend: 10.3>15.1>14.6>14.8  Anticoagulation Plan: -INR Goal: 2-2.5 -ASA Dose: 325  Blood Products:  Intra op: 2 FFP 450 Cell Saver  Arrythmias:   Infection:  08/06/23>>blood cxs>> in process 08/06/23>>sputum cx>>  Renal:  -BUN/CRT: 16/1.09>10/1.07>10/0.87>13/0.79>14/0.99  Gtts: Milrinone 0.125 mcg/kg/min-- off 8/13   Patient Education: Will plan to complete remainder of discharge teaching with pt and wife this afternoon.  Adverse Events on VAD: -  Plan/Recommendations:  1. Continue every other day dressing changes by VAD coordinator or Nurse  Alla Feeling 2. Please page VAD coordinator for any alarms or VAD equipment issues.  Carlton Adam RN VAD Coordinator  Office: [phone]  24/7 Pager: [phone]

## 2023-08-07 NOTE — Progress Notes (Signed)
HeartMate 3 Rounding Note Postop day #8 Subjective:   37 year old male with nonischemic cardiomyopathy presented with cardiogenic shock from severe LV dysfunction and moderate to severe RV dysfunction.  After full evaluation and counseling he underwent implantation of HeartMate 3 July 30, 2023  Patient gradually better with postop pain VAD speed study with echo shows 5600 RPMs is optimal.  Surgical incisions are healing well The PW's were removed today. Leave chest tube sutures in place to be removed at clinic visit.  Driveline exit site without significant drainage and appears secure.   INR  1.5>>4.4>>4.5>>2.9>>2.5 >> 2.6 >> 2.3 Coumadin per Pharm.D.  All chest drains have been removed. Temporary V PW's will be removed prior to discharge.  LVAD INTERROGATION:  HeartMate II LVAD:  Flow 4.4  liters/min, speed 5600, power 3.5, PI 3.7.  Controller intact  Objective:    Vital Signs:   Temp:  [97.7 F (36.5 C)-99.1 F (37.3 C)] 97.7 F (36.5 C) (08/15 1134) Pulse Rate:  [110-195] 177 (08/15 1115) BP: (75-126)/(49-99) 98/62 (08/15 1145) SpO2:  [68 %-95 %] 68 % (08/15 1115) Weight:  [65.4 kg] 65.4 kg (08/15 0500) Last BM Date : 08/04/23 Mean arterial Pressure 70-80  mm Hg  Intake/Output:   Intake/Output Summary (Last 24 hours) at 08/07/2023 1231 Last data filed at 08/07/2023 0600 Gross per 24 hour  Intake 980 ml  Output 1400 ml  Net -420 ml     Physical Exam: General:  Well appearing. No resp difficulty.  Complains of left chest pocket pain. HEENT: normal Neck: supple. JVP . Carotids  no bruits. No lymphadenopathy or thryomegaly appreciated. Cor: Mechanical heart sounds with LVAD hum present. Lungs: clear Abdomen: soft, nontender, nondistended. No hepatosplenomegaly. No bruits or masses.  Diminished bowel sounds. Extremities: no cyanosis, clubbing, rash, edema Neuro: alert & orientedx3, cranial nerves grossly intact. moves all 4 extremities w/o difficulty.  Okay  pleasant  Telemetry: Sinus tach 100-110  Labs: Basic Metabolic Panel: Recent Labs  Lab 08/02/23 0336 08/03/23 0343 08/03/23 0445 08/04/23 0343 08/05/23 0500 08/06/23 0521 08/07/23 0441  NA 134* 136 136 133* 131* 132* 131*  K 3.7 3.5 3.5 4.1 4.2 4.2 3.8  CL 95* 99  --  99 99 96* 99  CO2 28 26  --  25 21* 22 24  GLUCOSE 119* 95  --  101* 117* 133* 110*  BUN 6 8  --  10 13 14 14   CREATININE 1.03 0.91  --  0.87 0.79 0.99 0.68  CALCIUM 8.4* 8.3*  --  8.2* 8.3* 8.5* 8.2*  MG 1.7 1.7  --  2.0 1.8 1.9 1.9  PHOS 1.5* 2.7  --  3.2 3.1 3.0  --     Liver Function Tests: Recent Labs  Lab 08/03/23 0343 08/04/23 0343 08/05/23 0500 08/06/23 0521 08/07/23 0441  AST 32 22 23 47* 115*  ALT 19 16 16 29  68*  ALKPHOS 70 70 70 82 80  BILITOT 0.5 0.4 0.8 0.5 0.4  PROT 6.2* 6.2* 6.4* 6.7 6.6  ALBUMIN 2.8* 2.8* 2.7* 2.7* 2.4*   No results for input(s): "LIPASE", "AMYLASE" in the last 168 hours. No results for input(s): "AMMONIA" in the last 168 hours.  CBC: Recent Labs  Lab 08/03/23 0343 08/03/23 0445 08/04/23 0343 08/05/23 0500 08/06/23 0521 08/07/23 0441  WBC 9.7  --  10.3 15.1* 14.6* 14.8*  NEUTROABS 6.5  --  7.0 11.2* 10.2* 10.5*  HGB 12.3* 12.2* 12.1* 13.0 13.5 12.2*  HCT 36.7* 36.0* 38.2* 39.2 40.3 37.1*  MCV 90.2  --  89.7 86.3 87.8 88.3  PLT 248  --  282 351 415* 433*    INR: Recent Labs  Lab 08/03/23 0343 08/04/23 0343 08/05/23 0500 08/06/23 0521 08/07/23 0441  INR 3.6* 2.9* 2.5* 2.4* 2.3*    Other results: EKG:   Imaging: A.m. chest x-ray personally reviewed.   Medications:     Scheduled Medications:  aspirin EC  81 mg Oral Daily   bisacodyl  10 mg Oral Daily   buprenorphine-naloxone  1 tablet Sublingual Daily   Chlorhexidine Gluconate Cloth  6 each Topical Daily   digoxin  0.125 mg Oral Daily   docusate sodium  200 mg Oral Daily   doxycycline  100 mg Oral Q12H   gabapentin  300 mg Oral TID   lidocaine  2 patch Transdermal Q24H   loratadine   10 mg Oral QPM   losartan  25 mg Oral BID   melatonin  3 mg Oral QHS   mexiletine  200 mg Oral BID   mometasone-formoterol  2 puff Inhalation BID   montelukast  10 mg Oral q morning   pantoprazole  40 mg Oral Daily   polyethylene glycol  17 g Oral BID   senna-docusate  2 tablet Oral QHS   sertraline  25 mg Oral Daily   sodium chloride flush  10-40 mL Intracatheter Q12H   sodium chloride flush  10-40 mL Intracatheter Q12H   sodium chloride flush  3 mL Intravenous Q12H   warfarin  0.5 mg Oral q1600   Warfarin - Pharmacist Dosing Inpatient   Does not apply q1600    Infusions:  sodium chloride Stopped (08/03/23 1743)    PRN Medications: acetaminophen, cyclobenzaprine, fluticasone, hydrALAZINE, levalbuterol, LORazepam, nitroGLYCERIN, ondansetron (ZOFRAN) IV, mouth rinse, oxyCODONE, simethicone, sodium chloride flush, traMADol, traZODone   Assessment:  Status post HeartMate 3 implantation July 30, 2023 for nonischemic cardiomyopathy with biventricular dysfunction  Past medical history positive for polycythemia, asthma, and chronic Suboxone treatment for opioid abuse    Plan/Discussion:    Surgical incisions clean, dry WBC 14.6 all OR lines are out, Foley out DPW was removed today  CXR today shows left-sided hazy opacity with slightly diminished breath sounds on exam.  Will check PA and lateral chest x-ray to see if the patient has fluid in the fissure versus a free pleural effusion.  DC instructions regarding wound care and activity limitations were reviewed with patient.    I reviewed the LVAD parameters from today, and compared the results to the patient's prior recorded data.  No programming changes were made.  The LVAD is functioning within specified parameters.  The patient performs LVAD self-test daily.  LVAD interrogation was negative for any significant power changes, alarms or PI events/speed drops.  LVAD equipment check completed and is in good working order.   Back-up equipment present.   LVAD education done on emergency procedures and precautions and reviewed exit site care.  Length of Stay: 24  Lovett Sox 08/07/2023, 12:31 PM

## 2023-08-07 NOTE — TOC Progression Note (Signed)
Transition of Care [facility]) - Progression Note    Patient Details  Name: Joel York MRN: [id number] Date of Birth: 1986-06-13  [facility]) CM/SW Contact  Nicanor Bake Phone Number: [phone] 08/07/2023, 3:04 PM  Clinical Narrative:   CSW scheduled a PCP appointment for September 15, 2023 at 1:00 PM [facility] [place]. TOC will continue following.       Expected Discharge Plan: Home/Self Care Barriers to Discharge: Continued Medical Work up  Expected Discharge Plan and Services       Living arrangements for the past 2 months: Single Family Home                                       Social Determinants of Health (SDOH) Interventions SDOH Screenings   Food Insecurity: No Food Insecurity (07/14/2023)  Housing: Low Risk  (07/14/2023)  Transportation Needs: No Transportation Needs (07/14/2023)  Utilities: Not At Risk (07/14/2023)  Alcohol Screen: Low Risk  (07/14/2023)  Financial Resource Strain: High Risk (07/14/2023)  Physical Activity: Sufficiently Active (07/14/2023)  Stress: Stress Concern Present (07/14/2023)  Tobacco Use: High Risk (07/23/2023)    Readmission Risk Interventions     No data to display

## 2023-08-07 NOTE — Progress Notes (Signed)
ANTICOAGULATION CONSULT NOTE - Consult  Pharmacy Consult for warfarin Indication:  LVAD  Allergies  Allergen Reactions   Other Anaphylaxis    Tree Nuts   Peanuts [Peanut Oil] Anaphylaxis    Patient Measurements: Height: 5\' 7"  (170.2 cm) Weight: 65.4 kg (144 lb 2.9 oz) IBW/kg (Calculated) : 66.1   Vital Signs: Temp: 98.5 F (36.9 C) (08/15 0818) Temp Source: Oral (08/15 0818) BP: 101/83 (08/15 0444) Pulse Rate: 110 (08/15 0444)  Labs: Recent Labs    08/05/23 0500 08/06/23 0521 08/07/23 0441  HGB 13.0 13.5 12.2*  HCT 39.2 40.3 37.1*  PLT 351 415* 433*  LABPROT 27.0* 26.0* 25.7*  INR 2.5* 2.4* 2.3*  CREATININE 0.79 0.99 0.68    Estimated Creatinine Clearance: 118.1 mL/min (by C-G formula based on SCr of 0.68 mg/dL).   Medical History: Past Medical History:  Diagnosis Date   Acid reflux    ADHD (attention deficit hyperactivity disorder)    Asthma    Back pain    Seizures (HCC)    Resolved     Assessment: 37yom s/p LVAD started on warfarin post op. No AC PTA.   INR now down to 2.3 (warfarin on held since 8/9-12, received FFP 8/10). Hgb stable 12-13, plt stable 300s, LDH 200-300s (stable). LFTs WNL, total bilirubin now WNL. Taking ensure with oral intake improving    Goal of Therapy:  INR goal 2-2.5 Monitor platelets by anticoagulation protocol: Yes   Plan:  warfarin 0.5 mg every day  Daily INR, CBC and hepatic function   Leota Sauers Pharm.D. CPP, BCPS Clinical Pharmacist [phone] 08/07/2023 8:49 AM   Please check AMION for all [facility] phone numbers After 10:00 PM, call Main Pharmacy [phone]

## 2023-08-08 ENCOUNTER — Inpatient Hospital Stay (HOSPITAL_COMMUNITY): Payer: Medicaid Other

## 2023-08-08 ENCOUNTER — Encounter (HOSPITAL_COMMUNITY): Payer: Self-pay | Admitting: Unknown Physician Specialty

## 2023-08-08 ENCOUNTER — Telehealth (HOSPITAL_COMMUNITY): Payer: Self-pay

## 2023-08-08 ENCOUNTER — Other Ambulatory Visit (HOSPITAL_COMMUNITY): Payer: Self-pay

## 2023-08-08 DIAGNOSIS — J90 Pleural effusion, not elsewhere classified: Secondary | ICD-10-CM | POA: Diagnosis not present

## 2023-08-08 DIAGNOSIS — J9811 Atelectasis: Secondary | ICD-10-CM | POA: Diagnosis not present

## 2023-08-08 DIAGNOSIS — R570 Cardiogenic shock: Secondary | ICD-10-CM | POA: Diagnosis not present

## 2023-08-08 DIAGNOSIS — I509 Heart failure, unspecified: Secondary | ICD-10-CM | POA: Diagnosis not present

## 2023-08-08 DIAGNOSIS — Z452 Encounter for adjustment and management of vascular access device: Secondary | ICD-10-CM | POA: Diagnosis not present

## 2023-08-08 DIAGNOSIS — I5021 Acute systolic (congestive) heart failure: Secondary | ICD-10-CM | POA: Diagnosis not present

## 2023-08-08 DIAGNOSIS — Z48813 Encounter for surgical aftercare following surgery on the respiratory system: Secondary | ICD-10-CM | POA: Diagnosis not present

## 2023-08-08 DIAGNOSIS — Z95811 Presence of heart assist device: Secondary | ICD-10-CM | POA: Diagnosis not present

## 2023-08-08 HISTORY — PX: IR THORACENTESIS ASP PLEURAL SPACE W/IMG GUIDE: IMG5380

## 2023-08-08 LAB — PROTIME-INR
INR: 1.9 — ABNORMAL HIGH (ref 0.8–1.2)
Prothrombin Time: 21.9 s — ABNORMAL HIGH (ref 11.4–15.2)

## 2023-08-08 LAB — CBC WITH DIFFERENTIAL/PLATELET
Abs Immature Granulocytes: 0.12 10*3/uL — ABNORMAL HIGH (ref 0.00–0.07)
Basophils Absolute: 0.1 10*3/uL (ref 0.0–0.1)
Basophils Relative: 1 %
Eosinophils Absolute: 0.7 10*3/uL — ABNORMAL HIGH (ref 0.0–0.5)
Eosinophils Relative: 6 %
HCT: 33.9 % — ABNORMAL LOW (ref 39.0–52.0)
Hemoglobin: 11.4 g/dL — ABNORMAL LOW (ref 13.0–17.0)
Immature Granulocytes: 1 %
Lymphocytes Relative: 13 %
Lymphs Abs: 1.5 10*3/uL (ref 0.7–4.0)
MCH: 29.6 pg (ref 26.0–34.0)
MCHC: 33.6 g/dL (ref 30.0–36.0)
MCV: 88.1 fL (ref 80.0–100.0)
Monocytes Absolute: 1.6 10*3/uL — ABNORMAL HIGH (ref 0.1–1.0)
Monocytes Relative: 13 %
Neutro Abs: 7.9 10*3/uL — ABNORMAL HIGH (ref 1.7–7.7)
Neutrophils Relative %: 66 %
Platelets: 493 10*3/uL — ABNORMAL HIGH (ref 150–400)
RBC: 3.85 MIL/uL — ABNORMAL LOW (ref 4.22–5.81)
RDW: 14.5 % (ref 11.5–15.5)
WBC: 11.9 10*3/uL — ABNORMAL HIGH (ref 4.0–10.5)
nRBC: 0 % (ref 0.0–0.2)

## 2023-08-08 LAB — LACTATE DEHYDROGENASE: LDH: 291 U/L — ABNORMAL HIGH (ref 98–192)

## 2023-08-08 LAB — COOXEMETRY PANEL
Carboxyhemoglobin: 2.1 % — ABNORMAL HIGH (ref 0.5–1.5)
Methemoglobin: <0.7 % (ref 0.0–1.5)
O2 Saturation: 68.1 %
Total hemoglobin: 11.8 g/dL — ABNORMAL LOW (ref 12.0–16.0)

## 2023-08-08 LAB — MAGNESIUM: Magnesium: 2 mg/dL (ref 1.7–2.4)

## 2023-08-08 MED ORDER — DIGOXIN 125 MCG PO TABS
0.1250 mg | ORAL_TABLET | Freq: Every day | ORAL | 6 refills | Status: DC
  Filled 2023-08-08: qty 30, 30d supply, fill #0
  Filled 2023-09-13 (×2): qty 30, 30d supply, fill #1
  Filled 2023-10-20 – 2023-11-22 (×2): qty 30, 30d supply, fill #0
  Filled 2024-01-15: qty 30, 30d supply, fill #1
  Filled 2024-04-17: qty 30, 30d supply, fill #2
  Filled 2024-06-13: qty 30, 30d supply, fill #3
  Filled 2024-06-18: qty 30, 30d supply, fill #0
  Filled 2024-07-27: qty 30, 30d supply, fill #1

## 2023-08-08 MED ORDER — DOXYCYCLINE HYCLATE 100 MG PO TABS
100.0000 mg | ORAL_TABLET | Freq: Two times a day (BID) | ORAL | 0 refills | Status: AC
  Filled 2023-08-08: qty 24, 12d supply, fill #0

## 2023-08-08 MED ORDER — WARFARIN SODIUM 1 MG PO TABS
1.0000 mg | ORAL_TABLET | Freq: Every day | ORAL | 6 refills | Status: DC
  Filled 2023-08-08: qty 30, 30d supply, fill #0

## 2023-08-08 MED ORDER — MEXILETINE HCL 200 MG PO CAPS
200.0000 mg | ORAL_CAPSULE | Freq: Two times a day (BID) | ORAL | 6 refills | Status: DC
  Filled 2023-08-08: qty 60, 30d supply, fill #0
  Filled 2023-09-13: qty 60, 30d supply, fill #1

## 2023-08-08 MED ORDER — DOCUSATE SODIUM 100 MG PO CAPS
200.0000 mg | ORAL_CAPSULE | Freq: Every day | ORAL | 0 refills | Status: DC
  Filled 2023-08-08: qty 100, 50d supply, fill #0

## 2023-08-08 MED ORDER — PANTOPRAZOLE SODIUM 40 MG PO TBEC
40.0000 mg | DELAYED_RELEASE_TABLET | Freq: Every day | ORAL | 6 refills | Status: DC
  Filled 2023-08-08: qty 30, 30d supply, fill #0
  Filled 2023-09-13: qty 30, 30d supply, fill #1
  Filled 2023-10-20 – 2023-11-22 (×2): qty 30, 30d supply, fill #0
  Filled 2024-01-15: qty 30, 30d supply, fill #1
  Filled 2024-04-17: qty 30, 30d supply, fill #2
  Filled 2024-06-13: qty 30, 30d supply, fill #3
  Filled 2024-06-18: qty 30, 30d supply, fill #0
  Filled 2024-07-27: qty 30, 30d supply, fill #1

## 2023-08-08 MED ORDER — GABAPENTIN 300 MG PO CAPS
300.0000 mg | ORAL_CAPSULE | Freq: Three times a day (TID) | ORAL | 6 refills | Status: DC
  Filled 2023-08-08: qty 90, 30d supply, fill #0
  Filled 2023-10-19: qty 90, 30d supply, fill #1
  Filled 2024-01-15: qty 90, 30d supply, fill #2
  Filled 2024-04-17: qty 90, 30d supply, fill #3

## 2023-08-08 MED ORDER — TRAMADOL HCL 50 MG PO TABS
50.0000 mg | ORAL_TABLET | Freq: Four times a day (QID) | ORAL | 0 refills | Status: DC | PRN
  Filled 2023-08-08: qty 30, 4d supply, fill #0

## 2023-08-08 MED ORDER — LIDOCAINE HCL 1 % IJ SOLN
20.0000 mL | Freq: Once | INTRAMUSCULAR | Status: AC
  Administered 2023-08-08: 6 mL via INTRADERMAL

## 2023-08-08 MED ORDER — NITROGLYCERIN 0.4 MG SL SUBL
0.4000 mg | SUBLINGUAL_TABLET | SUBLINGUAL | 12 refills | Status: DC | PRN
  Filled 2023-08-08: qty 25, 7d supply, fill #0

## 2023-08-08 MED ORDER — LIDOCAINE HCL 1 % IJ SOLN
INTRAMUSCULAR | Status: AC
  Filled 2023-08-08: qty 20

## 2023-08-08 MED ORDER — TRAZODONE HCL 50 MG PO TABS
50.0000 mg | ORAL_TABLET | Freq: Every evening | ORAL | 6 refills | Status: AC | PRN
  Filled 2023-08-08: qty 30, 30d supply, fill #0
  Filled 2023-09-13: qty 30, 30d supply, fill #1

## 2023-08-08 MED ORDER — WARFARIN SODIUM 1 MG PO TABS
0.5000 mg | ORAL_TABLET | Freq: Every day | ORAL | 6 refills | Status: DC
  Filled 2023-08-08: qty 30, 60d supply, fill #0

## 2023-08-08 MED ORDER — ASPIRIN 81 MG PO TBEC
81.0000 mg | DELAYED_RELEASE_TABLET | Freq: Every day | ORAL | 3 refills | Status: DC
  Filled 2023-08-08: qty 120, 120d supply, fill #0

## 2023-08-08 MED ORDER — LOSARTAN POTASSIUM 25 MG PO TABS
25.0000 mg | ORAL_TABLET | Freq: Two times a day (BID) | ORAL | 6 refills | Status: DC
  Filled 2023-08-08: qty 60, 30d supply, fill #0
  Filled 2023-09-13: qty 60, 30d supply, fill #1
  Filled 2023-10-20 – 2023-11-22 (×2): qty 60, 30d supply, fill #0
  Filled 2024-03-02 – 2024-04-17 (×2): qty 60, 30d supply, fill #1
  Filled 2024-07-16 – 2024-07-27 (×2): qty 60, 30d supply, fill #2

## 2023-08-08 MED ORDER — ACETAMINOPHEN 325 MG PO TABS: 650.0000 mg | ORAL_TABLET | ORAL | Status: DC | PRN

## 2023-08-08 MED ORDER — SERTRALINE HCL 25 MG PO TABS
25.0000 mg | ORAL_TABLET | Freq: Every day | ORAL | 6 refills | Status: DC
  Filled 2023-08-08: qty 30, 30d supply, fill #0
  Filled 2023-09-13: qty 30, 30d supply, fill #1
  Filled 2023-10-20 – 2023-11-22 (×2): qty 30, 30d supply, fill #0
  Filled 2024-01-15: qty 30, 30d supply, fill #1
  Filled 2024-04-17: qty 30, 30d supply, fill #2
  Filled 2024-06-13: qty 30, 30d supply, fill #3
  Filled 2024-06-18: qty 30, 30d supply, fill #0
  Filled 2024-07-27: qty 30, 30d supply, fill #1

## 2023-08-08 MED ORDER — LIDOCAINE 4 % EX PTCH
2.0000 | MEDICATED_PATCH | CUTANEOUS | 0 refills | Status: DC
  Filled 2023-08-08: qty 18, 9d supply, fill #0

## 2023-08-08 NOTE — Progress Notes (Signed)
Pt not in room, left materials on CR. Will refer to [facility].  Faustino Congress MS, ACSM-CEP 08/08/2023 9:05 AM

## 2023-08-08 NOTE — Progress Notes (Signed)
Physical Therapy Discharge Patient Details Name: Joel York MRN: [id number] DOB: [birth year] Today's Date: 08/08/2023 Time:  -     Patient discharged from PT services secondary to goals met and no further PT needs identified.  Please see latest therapy progress note for current level of functioning and progress toward goals.    Progress and discharge plan discussed with patient and/or caregiver: Patient/Caregiver agrees with plan  GP     [name] [facility] 08/08/2023, 1:26 PM  Skip Mayer PT Acute Rehabilitation Services Office [phone]

## 2023-08-08 NOTE — Procedures (Signed)
PROCEDURE SUMMARY:  Successful image-guided left thoracentesis. Yielded 450 mL of red colored fluid. Pt tolerated procedure well. No immediate complications. EBL = trace   Specimen was not sent for labs. CXR ordered.  Patient developed significant left sided chest pain during the procedure, vital stable and lung sound present.  Thoracentesis was terminated after removing 450 mL of fluid due to pain. Post procedure ultrasound showed near complete resolution of left pleural effusion.   Please see imaging section of Epic for full dictation.  Venice Liz H Antia Rahal PA-C 08/08/2023 9:22 AM

## 2023-08-08 NOTE — Progress Notes (Signed)
LVAD Coordinator Rounding Note:  Admitted 07/14/23 from ED due to worsening HF symptoms.   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  Pt laying in bed. Denies complaints. Excited for discharge today. Panic attacks improved with starting Zoloft 25 mg daily and Trazodone 50 mg q HS.   WBC 11.9 today. Afebrile. Currently taking Doxycycline 100 mg BID x 7 days. Moderate L effusion on chest xray today. Per Dr Donata Clay pt had L thoracentesis this morning with 450 cc drained.   Pacing wires removed yesterday. Chest tube sutures in place- to be removed at clinic visit per Dr Donata Clay.   Discharge teaching completed 8/15. See separate note for details.   VAD clinic f/u scheduled 8/22 at 9 AM.   Vital signs: Temp: 98.5 HR: 112 Doppler Pressure: 84 Auto BP: O2 Sat: 98% on RA Wt: 150.3>154.3>161.3>146.3>133.4>146.6>144.1>144.1 lbs     LVAD interrogation reveals:  Speed: 5600 Flow: 4.4 Power: 4.3 w PI: 2.7  Alarms: none Events: 6 today Hematocrit: 34  Fixed speed: 5600 Low speed limit: 5300  Drive Line:  Existing VAD dressing removed and site care performed using sterile technique by  caregiver [employer]. Drive line exit site cleaned with Chlora prep applicators x 2, allowed to dry, and silver strip with gauze applied. Exit site healing and unincorporated, the velour is fully implanted at exit site. Scant amount of serosanguinous/dried bloody drainage. No redness, tenderness, foul odor or rash noted. 1 suture in place under the driveline. Drive line anchor re-applied. Continue every other day dressing changes. Next dressing change due Sunday 08/10/23 by caregiver.   Labs:  LDH trend: 427>487>292>316>259>251>291  INR trend: 1.4>1.5>2.9>2.5>2.4>2.3>1.9  WBC trend: 10.3>15.1>14.6>14.8>11.9  Anticoagulation Plan: -INR Goal: 2-2.5 -ASA Dose: 325  Blood Products:  Intra op: 2 FFP 450 Cell Saver  Arrythmias:   Infection:  08/06/23>>blood cxs>> no growth x 2  days   Renal:  -BUN/CRT: 16/1.09>10/1.07>10/0.87>13/0.79>14/0.99  Gtts: Milrinone 0.125 mcg/kg/min-- off 8/13   Patient Education: Discharge teaching completed 8/15. See separate note for details. VAD coordinator observed Penni Bombard perform dressing change. She is now checked off to independently perform wound care.   Adverse Events on VAD: -  Plan/Recommendations:  1. Continue every other day dressing changes by VAD coordinator or Nurse Alla Feeling 2. Please page VAD coordinator for any alarms or VAD equipment issues. 3. Ok for discharge today per Dr Gala Romney and Dr Donata Clay.   Alyce Pagan RN VAD Coordinator  Office: [phone]  24/7 Pager: [phone]

## 2023-08-08 NOTE — Progress Notes (Signed)
HeartMate 3 Rounding Note Postop day #9 Subjective:   37 year old male with nonischemic cardiomyopathy presented with cardiogenic shock from severe LV dysfunction and moderate to severe RV dysfunction.  After full evaluation and counseling he underwent implantation of HeartMate 3 July 30, 2023  Patient gradually better with postop pain VAD speed study with echo shows 5600 RPMs is optimal.  Surgical incisions are healing well The PW's were removed\, Leave chest tube sutures in place to be removed at clinic visit.  Driveline exit site without significant drainage and appears secure.   INR  1.5>>4.4>>4.5>>2.9>>2.5 >> 2.6 >> 2.3>>1.9 Coumadin per Pharm.D.   Postop L p[eural effusion confirmed by PA-Lat CXR today. With INR< 2 will ask for IR thoracentesis Prior to discharge   LVAD INTERROGATION:  HeartMate II LVAD:  Flow 4.4  liters/min, speed 5600, power 3.5, PI 3.7.  Controller intact  Objective:    Vital Signs:   Temp:  [97.7 F (36.5 C)-99.2 F (37.3 C)] 98.7 F (37.1 C) (08/16 0715) Pulse Rate:  [106-195] 106 (08/16 0715) Resp:  [15-19] 15 (08/16 0715) BP: (81-111)/(49-101) 97/64 (08/16 0715) SpO2:  [68 %-98 %] 98 % (08/15 2303) Weight:  [65.4 kg] 65.4 kg (08/16 0300) Last BM Date : 08/07/23 Mean arterial Pressure 70-80  mm Hg  Intake/Output:  No intake or output data in the 24 hours ending 08/08/23 0855    Physical Exam: General:  Well appearing. No resp difficulty.  Complains of left chest pocket pain. HEENT: normal Neck: supple. JVP . Carotids  no bruits. No lymphadenopathy or thryomegaly appreciated. Cor: Mechanical heart sounds with LVAD hum present. Lungs:decreased BS L base Abdomen: soft, nontender, nondistended. No hepatosplenomegaly. No bruits or masses.  Diminished bowel sounds. Extremities: no cyanosis, clubbing, rash, edema Neuro: alert & orientedx3, cranial nerves grossly intact. moves all 4 extremities w/o difficulty.  Okay pleasant  Telemetry:  Sinus tach 100-110  Labs: Basic Metabolic Panel: Recent Labs  Lab 08/02/23 0336 08/03/23 0343 08/03/23 0445 08/04/23 0343 08/05/23 0500 08/06/23 0521 08/07/23 0441 08/08/23 0535  NA 134* 136 136 133* 131* 132* 131*  --   K 3.7 3.5 3.5 4.1 4.2 4.2 3.8  --   CL 95* 99  --  99 99 96* 99  --   CO2 28 26  --  25 21* 22 24  --   GLUCOSE 119* 95  --  101* 117* 133* 110*  --   BUN 6 8  --  10 13 14 14   --   CREATININE 1.03 0.91  --  0.87 0.79 0.99 0.68  --   CALCIUM 8.4* 8.3*  --  8.2* 8.3* 8.5* 8.2*  --   MG 1.7 1.7  --  2.0 1.8 1.9 1.9 2.0  PHOS 1.5* 2.7  --  3.2 3.1 3.0  --   --     Liver Function Tests: Recent Labs  Lab 08/03/23 0343 08/04/23 0343 08/05/23 0500 08/06/23 0521 08/07/23 0441  AST 32 22 23 47* 115*  ALT 19 16 16 29  68*  ALKPHOS 70 70 70 82 80  BILITOT 0.5 0.4 0.8 0.5 0.4  PROT 6.2* 6.2* 6.4* 6.7 6.6  ALBUMIN 2.8* 2.8* 2.7* 2.7* 2.4*   No results for input(s): "LIPASE", "AMYLASE" in the last 168 hours. No results for input(s): "AMMONIA" in the last 168 hours.  CBC: Recent Labs  Lab 08/04/23 0343 08/05/23 0500 08/06/23 0521 08/07/23 0441 08/08/23 0535  WBC 10.3 15.1* 14.6* 14.8* 11.9*  NEUTROABS 7.0 11.2* 10.2* 10.5* 7.9*  HGB 12.1* 13.0 13.5 12.2* 11.4*  HCT 38.2* 39.2 40.3 37.1* 33.9*  MCV 89.7 86.3 87.8 88.3 88.1  PLT 282 351 415* 433* 493*    INR: Recent Labs  Lab 08/04/23 0343 08/05/23 0500 08/06/23 0521 08/07/23 0441 08/08/23 0535  INR 2.9* 2.5* 2.4* 2.3* 1.9*    Other results: EKG:   Imaging: A.m. chest x-ray personally reviewed.   Medications:     Scheduled Medications:  aspirin EC  81 mg Oral Daily   bisacodyl  10 mg Oral Daily   buprenorphine-naloxone  1 tablet Sublingual Daily   Chlorhexidine Gluconate Cloth  6 each Topical Daily   digoxin  0.125 mg Oral Daily   docusate sodium  200 mg Oral Daily   doxycycline  100 mg Oral Q12H   gabapentin  300 mg Oral TID   lidocaine  2 patch Transdermal Q24H   loratadine   10 mg Oral QPM   losartan  25 mg Oral BID   melatonin  3 mg Oral QHS   mexiletine  200 mg Oral BID   mometasone-formoterol  2 puff Inhalation BID   montelukast  10 mg Oral q morning   pantoprazole  40 mg Oral Daily   polyethylene glycol  17 g Oral BID   senna-docusate  2 tablet Oral QHS   sertraline  25 mg Oral Daily   sodium chloride flush  10-40 mL Intracatheter Q12H   sodium chloride flush  10-40 mL Intracatheter Q12H   sodium chloride flush  3 mL Intravenous Q12H   warfarin  0.5 mg Oral q1600   Warfarin - Pharmacist Dosing Inpatient   Does not apply q1600    Infusions:  sodium chloride Stopped (08/03/23 1743)    PRN Medications: acetaminophen, cyclobenzaprine, fluticasone, hydrALAZINE, levalbuterol, LORazepam, nitroGLYCERIN, ondansetron (ZOFRAN) IV, mouth rinse, oxyCODONE, simethicone, sodium chloride flush, traMADol, traZODone   Assessment:  Status post HeartMate 3 implantation July 30, 2023 for nonischemic cardiomyopathy with biventricular dysfunction  Past medical history positive for polycythemia, asthma, and chronic Suboxone treatment for opioid abuse    Plan/Discussion:    Surgical incisions clean, dry WBC 11.9, improved .   OR lines are out, Foley out EPW were removed   Moderate L effusion on todays CXR INR now < 2.0 Will ask IR for L thoracentesis prior to DC    DC instructions regarding wound care and activity limitations were reviewed with patient.    I reviewed the LVAD parameters from today, and compared the results to the patient's prior recorded data.  No programming changes were made.  The LVAD is functioning within specified parameters.  The patient performs LVAD self-test daily.  LVAD interrogation was negative for any significant power changes, alarms or PI events/speed drops.  LVAD equipment check completed and is in good working order.  Back-up equipment present.   LVAD education done on emergency procedures and precautions and reviewed exit  site care.  Length of Stay: 25  Lovett Sox 08/08/2023, 8:55 AM

## 2023-08-08 NOTE — Progress Notes (Signed)
ANTICOAGULATION CONSULT NOTE - Consult  Pharmacy Consult for warfarin Indication:  LVAD  Allergies  Allergen Reactions   Other Anaphylaxis    Tree Nuts   Peanuts [Peanut Oil] Anaphylaxis    Patient Measurements: Height: 5\' 7"  (170.2 cm) Weight: 65.4 kg (144 lb 1.6 oz) IBW/kg (Calculated) : 66.1   Vital Signs: Temp: 98.7 F (37.1 C) (08/16 0715) Temp Source: Oral (08/16 0715) BP: 85/55 (08/16 0938) Pulse Rate: 106 (08/16 0715)  Labs: Recent Labs    08/06/23 0521 08/07/23 0441 08/08/23 0535  HGB 13.5 12.2* 11.4*  HCT 40.3 37.1* 33.9*  PLT 415* 433* 493*  LABPROT 26.0* 25.7* 21.9*  INR 2.4* 2.3* 1.9*  CREATININE 0.99 0.68  --     Estimated Creatinine Clearance: 118.1 mL/min (by C-G formula based on SCr of 0.68 mg/dL).   Medical History: Past Medical History:  Diagnosis Date   Acid reflux    ADHD (attention deficit hyperactivity disorder)    Asthma    Back pain    Seizures (HCC)    Resolved     Assessment: 37yom s/p LVAD started on warfarin post op. No AC PTA.   INR now down to 1.9 on lower dose warfarin 0.5 (warfarin on held 8/9-12, received FFP 8/10). Hgb stable 12-13, plt stable 300s, LDH 200-300s (stable). LFTs WNL, total bilirubin now WNL. Taking ensure with oral intake improving    Goal of Therapy:  INR goal 2-2.5 Monitor platelets by anticoagulation protocol: Yes   Plan:  Increase Warfarin 1mg  daily  Dc home follow up INR in clinic next week    Leota Sauers Pharm.D. CPP, BCPS Clinical Pharmacist [phone] 08/08/2023 10:21 AM   Please check AMION for all [facility] Pharmacy phone numbers After 10:00 PM, call Main Pharmacy [phone]

## 2023-08-08 NOTE — Progress Notes (Signed)
Patient had AVS gone over with, answered all questions, removed ivs, patient/patient's wife were taught on dressing changes, patient belongings gathered and wheeled out to wife's vehicle.

## 2023-08-08 NOTE — Discharge Instructions (Signed)
Information on my medicine - Coumadin   (Warfarin)  This medication education was reviewed with me or my healthcare representative as part of my discharge preparation.   Why was Coumadin prescribed for you? Coumadin was prescribed for you because you have a blood clot or a medical condition that can cause an increased risk of forming blood clots. Blood clots can cause serious health problems by blocking the flow of blood to the heart, lung, or brain. Coumadin can prevent harmful blood clots from forming. As a reminder your indication for Coumadin is:  Blood Clot Prevention after Heart Pump Surgery  What test will check on my response to Coumadin? While on Coumadin (warfarin) you will need to have an INR test regularly to ensure that your dose is keeping you in the desired range. The INR (international normalized ratio) number is calculated from the result of the laboratory test called prothrombin time (PT).  If an INR APPOINTMENT HAS NOT ALREADY BEEN MADE FOR YOU please schedule an appointment to have this lab work done by your health care provider within 7 days. Your INR goal is a number between:  2 to 2.5   What  do you need to  know  About  COUMADIN? Take Coumadin (warfarin) exactly as prescribed by your healthcare provider about the same time each day.  DO NOT stop taking without talking to the doctor who prescribed the medication.  Stopping without other blood clot prevention medication to take the place of Coumadin may increase your risk of developing a new clot or stroke.  Get refills before you run out.  What do you do if you miss a dose? If you miss a dose, take it as soon as you remember on the same day then continue your regularly scheduled regimen the next day.  Do not take two doses of Coumadin at the same time.  Important Safety Information A possible side effect of Coumadin (Warfarin) is an increased risk of bleeding. You should call your healthcare provider right away if you  experience any of the following: Bleeding from an injury or your nose that does not stop. Unusual colored urine (red or dark brown) or unusual colored stools (red or black). Unusual bruising for unknown reasons. A serious fall or if you hit your head (even if there is no bleeding).  Some foods or medicines interact with Coumadin (warfarin) and might alter your response to warfarin. To help avoid this: Eat a balanced diet, maintaining a consistent amount of Vitamin K. Notify your provider about major diet changes you plan to make. Avoid alcohol or limit your intake to 1 drink for women and 2 drinks for men per day. (1 drink is 5 oz. wine, 12 oz. beer, or 1.5 oz. liquor.)  Make sure that ANY health care provider who prescribes medication for you knows that you are taking Coumadin (warfarin).  Also make sure the healthcare provider who is monitoring your Coumadin knows when you have started a new medication including herbals and non-prescription products.  Coumadin (Warfarin)  Major Drug Interactions  Increased Warfarin Effect Decreased Warfarin Effect  Alcohol (large quantities) Antibiotics (esp. Septra/Bactrim, Flagyl, Cipro) Amiodarone (Cordarone) Aspirin (ASA) Cimetidine (Tagamet) Megestrol (Megace) NSAIDs (ibuprofen, naproxen, etc.) Piroxicam (Feldene) Propafenone (Rythmol SR) Propranolol (Inderal) Isoniazid (INH) Posaconazole (Noxafil) Barbiturates (Phenobarbital) Carbamazepine (Tegretol) Chlordiazepoxide (Librium) Cholestyramine (Questran) Griseofulvin Oral Contraceptives Rifampin Sucralfate (Carafate) Vitamin K   Coumadin (Warfarin) Major Herbal Interactions  Increased Warfarin Effect Decreased Warfarin Effect  Garlic Ginseng Ginkgo biloba Coenzyme Q10  Green tea St. Johns wort    Coumadin (Warfarin) FOOD Interactions  Eat a consistent number of servings per week of foods HIGH in Vitamin K (1 serving =  cup)  Collards (cooked, or boiled & drained) Kale  (cooked, or boiled & drained) Mustard greens (cooked, or boiled & drained) Parsley *serving size only =  cup Spinach (cooked, or boiled & drained) Swiss chard (cooked, or boiled & drained) Turnip greens (cooked, or boiled & drained)  Eat a consistent number of servings per week of foods MEDIUM-HIGH in Vitamin K (1 serving = 1 cup)  Asparagus (cooked, or boiled & drained) Broccoli (cooked, boiled & drained, or raw & chopped) Brussel sprouts (cooked, or boiled & drained) *serving size only =  cup Lettuce, raw (green leaf, endive, romaine) Spinach, raw Turnip greens, raw & chopped   These websites have more information on Coumadin (warfarin):  FailFactory.se; VeganReport.com.au;

## 2023-08-08 NOTE — Progress Notes (Addendum)
Discharge equipment includes:  1. Two system controllers. 2. Mobile Power Unit (MPU) with 20' patient cable 3. One universal Magazine features editor (UBC) 4. Eight fully charged batteries  5. Four battery clips 6. One travel case 7. 1 Consolidated bag  8. Wearable accessory package 9. Daily dressing kits, anchors, Aquacel (silver dressing)   VAD Education:    1. Reviewed importance of having a 24 hour caregiver 2. Reviewed dressing change frequency 3. Reviewed when to call the VAD pager and made sure they have phone number in their phone.  4. Reviewed importance of changing one power source at a time 5. Reviewed importance of carrying black emergency bag containing backup controller, 2 batteries, and 2 battery clips, everywhere 6. Reviewed importance of placing mobile power unit (MPU) and batteries on bedside table with a flashlight. Talked about what to do in case of power failure. Reminded to make sure the outlets that equipment is plugged into are not controlled by a light switch.  7. Reviewed importance of using anchors to hold drive line in place, to prevent accidental pulling, or dislodgement of drive line.  8. Patient and family agreed to pick up prescriptions. Stressed importance of taking Warfarin daily in the evening. Stressed importance of taking all prescribed medications as written 9. First clinic visit bring all medications and VAD log.   Alyce Pagan RN VAD Coordinator  Office: [phone]  24/7 Pager: [phone]

## 2023-08-08 NOTE — Progress Notes (Addendum)
Advanced Heart Failure VAD Team Note  PCP-Cardiologist: None   Subjective:   8/7 S/P HMIII LVAD 8/8 Extubated.   INR 1.9   Denies SOB. Pain 2/10.  Wants to go home.   dLVAD INTERROGATION:  HeartMate III LVAD:   Flow 4.4liters/min, speed 5600, power 4, PI 2.1 . Rare PI events   Objective:    Vital Signs:   Temp:  [97.7 F (36.5 C)-99.2 F (37.3 C)] 98.7 F (37.1 C) (08/16 0715) Pulse Rate:  [106-195] 106 (08/16 0715) Resp:  [15-19] 15 (08/16 0715) BP: (81-111)/(49-101) 97/64 (08/16 0715) SpO2:  [68 %-98 %] 98 % (08/15 2303) Weight:  [65.4 kg] 65.4 kg (08/16 0300) Last BM Date : 08/07/23 Mean arterial Pressure 70s  Intake/Output:  No intake or output data in the 24 hours ending 08/08/23 0721    Physical Exam  Physical Exam: GENERAL: No acute distress. HEENT: normal  NECK: Supple, JVP  flat .  2+ bilaterally, no bruits.  No lymphadenopathy or thyromegaly appreciated.   CARDIAC:  Mechanical heart sounds with LVAD hum present.  LUNGS:  Clear to auscultation bilaterally.  ABDOMEN:  Soft, round, nontender, positive bowel sounds x4.     LVAD exit site:  Dressing dry and intact.  No erythema or drainage.  Stabilization device present and accurately applied.  Driveline dressing is being changed daily per sterile technique. EXTREMITIES:  Warm and dry, no cyanosis, clubbing, rash or edema  NEUROLOGIC:  Alert and oriented x 3.    No aphasia.  No dysarthria.  Affect pleasant.       Telemetry   ST   Labs   Basic Metabolic Panel: Recent Labs  Lab 08/02/23 0336 08/03/23 0343 08/03/23 0445 08/04/23 0343 08/05/23 0500 08/06/23 0521 08/07/23 0441 08/08/23 0535  NA 134* 136 136 133* 131* 132* 131*  --   K 3.7 3.5 3.5 4.1 4.2 4.2 3.8  --   CL 95* 99  --  99 99 96* 99  --   CO2 28 26  --  25 21* 22 24  --   GLUCOSE 119* 95  --  101* 117* 133* 110*  --   BUN 6 8  --  10 13 14 14   --   CREATININE 1.03 0.91  --  0.87 0.79 0.99 0.68  --   CALCIUM 8.4* 8.3*  --  8.2*  8.3* 8.5* 8.2*  --   MG 1.7 1.7  --  2.0 1.8 1.9 1.9 2.0  PHOS 1.5* 2.7  --  3.2 3.1 3.0  --   --     Liver Function Tests: Recent Labs  Lab 08/03/23 0343 08/04/23 0343 08/05/23 0500 08/06/23 0521 08/07/23 0441  AST 32 22 23 47* 115*  ALT 19 16 16 29  68*  ALKPHOS 70 70 70 82 80  BILITOT 0.5 0.4 0.8 0.5 0.4  PROT 6.2* 6.2* 6.4* 6.7 6.6  ALBUMIN 2.8* 2.8* 2.7* 2.7* 2.4*   No results for input(s): "LIPASE", "AMYLASE" in the last 168 hours. No results for input(s): "AMMONIA" in the last 168 hours.  CBC: Recent Labs  Lab 08/04/23 0343 08/05/23 0500 08/06/23 0521 08/07/23 0441 08/08/23 0535  WBC 10.3 15.1* 14.6* 14.8* 11.9*  NEUTROABS 7.0 11.2* 10.2* 10.5* 7.9*  HGB 12.1* 13.0 13.5 12.2* 11.4*  HCT 38.2* 39.2 40.3 37.1* 33.9*  MCV 89.7 86.3 87.8 88.3 88.1  PLT 282 351 415* 433* 493*    INR: Recent Labs  Lab 08/04/23 0343 08/05/23 0500 08/06/23 0521 08/07/23 0441 08/08/23  0535  INR 2.9* 2.5* 2.4* 2.3* 1.9*    Other results:   Imaging   DG Chest Port 1 View  Result Date: 08/07/2023 CLINICAL DATA:  [id number] LVAD (left ventricular assist device) present [facility]) [id number] EXAM: PORTABLE CHEST 1 VIEW COMPARISON:  08/05/2023 FINDINGS: Right PICC line remains in place terminating at the level of the right atrium. Unchanged positioning of left tracheal ir assist device. Stable cardiomegaly status post sternotomy. Progressive opacification at the left lung base, likely a combination of pleural effusion and atelectasis or consolidation. Similar mild right basilar atelectasis. No pneumothorax. IMPRESSION: Progressive opacification at the left lung base, likely a combination of pleural effusion and atelectasis or consolidation. Electronically Signed   By: Duanne Guess D.O.   On: 08/07/2023 10:31   ECHOCARDIOGRAM LIMITED  Result Date: 08/06/2023    ECHOCARDIOGRAM LIMITED REPORT   Patient Name:   Joel York Date of Exam: 08/06/2023 Medical Rec #:  [id number]    Height:       67.0  in Accession #:    [id number]   Weight:       146.6 lb Date of Birth:  14-Mar-1986    BSA:          1.772 m Patient Age:    37 years     BP:           94/80 mmHg Patient Gender: M            HR:           116 bpm. Exam Location:  Inpatient Procedure: Limited Echo, Color Doppler and Cardiac Doppler Indications:    RAMP LVAD  History:        Patient has prior history of Echocardiogram examinations, most                 recent 07/30/2023.  Sonographer:    Irving Burton Senior RDCS Referring Phys: [id number] Eliud Polo R Virl Coble  Sonographer Comments: Ramp study with Dr Gala Romney at bedside IMPRESSIONS  1. Limited RAMP/LVAD study.  2. Left ventricular ejection fraction, by estimation, is <20%. The left ventricle has severely decreased function. The left ventricle demonstrates global hypokinesis.  3. Right ventricular systolic function is severely reduced. The right ventricular size is normal.  4. Aortic valve regurgitation is trivial. FINDINGS  Left Ventricle: Left ventricular ejection fraction, by estimation, is <20%. The left ventricle has severely decreased function. The left ventricle demonstrates global hypokinesis. Right Ventricle: The right ventricular size is normal. Right ventricular systolic function is severely reduced. Right Atrium: Right atrial size was normal in size. Pericardium: There is no evidence of pericardial effusion. Tricuspid Valve: Tricuspid valve regurgitation is trivial. Aortic Valve: Aortic valve regurgitation is trivial. Additional Comments: Limited RAMP/LVAD study. Spectral Doppler performed. Color Doppler performed.  Olga Millers MD Electronically signed by Olga Millers MD Signature Date/Time: 08/06/2023/12:36:19 PM    Final      Medications:     Scheduled Medications:  aspirin EC  81 mg Oral Daily   bisacodyl  10 mg Oral Daily   buprenorphine-naloxone  1 tablet Sublingual Daily   Chlorhexidine Gluconate Cloth  6 each Topical Daily   digoxin  0.125 mg Oral Daily   docusate sodium  200 mg Oral  Daily   doxycycline  100 mg Oral Q12H   gabapentin  300 mg Oral TID   lidocaine  2 patch Transdermal Q24H   loratadine  10 mg Oral QPM   losartan  25 mg Oral BID   melatonin  3  mg Oral QHS   mexiletine  200 mg Oral BID   mometasone-formoterol  2 puff Inhalation BID   montelukast  10 mg Oral q morning   pantoprazole  40 mg Oral Daily   polyethylene glycol  17 g Oral BID   senna-docusate  2 tablet Oral QHS   sertraline  25 mg Oral Daily   sodium chloride flush  10-40 mL Intracatheter Q12H   sodium chloride flush  10-40 mL Intracatheter Q12H   sodium chloride flush  3 mL Intravenous Q12H   warfarin  0.5 mg Oral q1600   Warfarin - Pharmacist Dosing Inpatient   Does not apply q1600    Infusions:  sodium chloride Stopped (08/03/23 1743)    PRN Medications: acetaminophen, cyclobenzaprine, fluticasone, hydrALAZINE, levalbuterol, LORazepam, nitroGLYCERIN, ondansetron (ZOFRAN) IV, mouth rinse, oxyCODONE, simethicone, sodium chloride flush, traMADol, traZODone   Patient Profile   Joel York is a 37 y.o. male with HTN, ADD, asthma, hx drug abuse (now on suboxone x5 yrs), and seizures. Admitted with acute systolic heart failure. NYHA IV -> shock.   S/P HMIII LVAD   Assessment/Plan:    1. S/P HMIII LVAD  on 8/7 - Continue VAD speed at 5600. Ramp echo 08/14 - no changes made. LDH stable INR 1.9. WBC 11.9 - MAPs stable. Continue losartan 25 mg BID. - Continue gabapentin. Have been minimizing oxycodone and using tramadol.  - Panic attacks at night.  - Continue SSRI and Trazodone to help with sleep - Continue to mobilize  2. Acute biventricular systolic heart failure >> cardiogenic shock - Admitted NYHA IV symptoms .Echo EF <20%,  RV mildly reduced, RV mod enlarged, estimated RV systolic pressure 53.70mmHg, LA mod dilated, RA mod dilated, mild-mod MR/TR - Etiology uncertain. ? 2/2 PVCs vs genetically mediated +/- hypertension.  - LHC with normal coronaries. RHC 07/24: Low CO (Fick  CO/CI 3.3/1.9) and PAPi 3.0 on milrinone 0.375 . Off NE - cMRI:  LV markedly dilated EF 10% RV 17% NICM - Limited echo 07/29: LVEF < 20%, RV function improved. - Discussed with [facility] not a candidate for transplant with smoking.  - S/P HMIII. CO-OX 68% - Volume status ok - Continue digoxin 0.125 mg daily - MAPs 70s. Continue losartan 25 mg BID - INR 1.9 today  Warfarin dosing per PharmD.  - LDH stable.   3. Chest pressure - HsTrop 39>43>37>40, d/t demand ischemia - No CAD on cath - No s/s angina   4. PVCs - snores, will need eventual sleep study  - Suppressed with Mexiletine 200 mg BID - Keep K>4 and Mg >2   5. AKI - resolved.   6. Tobacco use - smokes 3-6 cigarettes / day - cessation discussed   7. Hx drug abuse - Continue suboxone - minimize narcotics  8. Leukocytosis - WBC 1down to 11.9 today -AF. BC X 2 NGTD.  - On empiric course of po doxy - Use incentive spirometer.   Completed VAD teaching. Home today.    Length of Stay: 25  Tonye Becket, NP  7:21 AM  VAD Team --- VAD ISSUES ONLY--- Pager [phone] (7am - 7am)  Advanced Heart Failure Team  Pager [phone] (M-F; 7a - 5p)  Please contact [facility] for night-coverage after hours (5p -7a ) and weekends on [email or web address]  Patient seen and examined with the above-signed Advanced Practice Provider and/or Housestaff. I personally reviewed laboratory data, imaging studies and relevant notes. I independently examined the patient and formulated the important aspects of the plan. I  have edited the note to reflect any of my changes or salient points. I have personally discussed the plan with the patient and/or family.  Feels good. Anxious to go home. Denies CP or SOB.   VAD interrogated personally. Parameters stable.  CXR this am with moderate R pleural effusion,. INR 1.9  General:  NAD.  HEENT: normal  Neck: supple. JVP not elevated.  Carotids 2+ bilat; no bruits. No lymphadenopathy or thryomegaly  appreciated. Cor: LVAD hum.  Lungs: Clear. Decreased 1/2 on R  Abdomen: obese soft, nontender, non-distended. No hepatosplenomegaly. No bruits or masses. Good bowel sounds. Driveline site clean. Anchor in place.  Extremities: no cyanosis, clubbing, rash. Warm no edema  Neuro: alert & oriented x 3. No focal deficits. Moves all 4 without problem   He looks good. Will plan d/c home today after R thora. F/u in VAD clinic next week. D/w VAD coordinators and PharmD.   [name], MD  11:10 PM

## 2023-08-08 NOTE — TOC Transition Note (Signed)
Transition of Care [facility]) - CM/SW Discharge Note   Patient Details  Name: Joel York MRN: [id number] Date of Birth: 11-16-1986  Transition of Care [facility]) CM/SW Contact:  Elliot Cousin, RN Phone Number: [phone] 08/08/2023, 8:39 AM   Clinical Narrative:   Scheduled dc home today, family will provide transportation home. [facility] to establish with PCP. Meds to come up from [facility] pharmacy.     Final next level of care: Home/Self Care Barriers to Discharge: No Barriers Identified   Patient Goals and CMS Choice      Discharge Placement      Discharge Plan and Services Additional resources added to the After Visit Summary for   In-house Referral: Clinical Social Work Discharge Planning Services: CM Consult              Social Determinants of Health (SDOH) Interventions SDOH Screenings   Food Insecurity: No Food Insecurity (07/14/2023)  Housing: Low Risk  (07/14/2023)  Transportation Needs: No Transportation Needs (07/14/2023)  Utilities: Not At Risk (07/14/2023)  Alcohol Screen: Low Risk  (07/14/2023)  Financial Resource Strain: High Risk (07/14/2023)  Physical Activity: Sufficiently Active (07/14/2023)  Stress: Stress Concern Present (07/14/2023)  Tobacco Use: High Risk (07/23/2023)     Readmission Risk Interventions     No data to display

## 2023-08-08 NOTE — Telephone Encounter (Signed)
Pharmacy Patient Advocate Encounter  Received notification from [facility] that Prior Authorization for Lidocaine patches has been DENIED    PA #/Case ID/Reference #: [id number] KEY: [id number]

## 2023-08-09 ENCOUNTER — Encounter (HOSPITAL_COMMUNITY): Payer: Self-pay

## 2023-08-11 ENCOUNTER — Telehealth: Payer: Self-pay

## 2023-08-11 LAB — CULTURE, BLOOD (ROUTINE X 2)
Culture: NO GROWTH
Culture: NO GROWTH
Special Requests: ADEQUATE
Special Requests: ADEQUATE

## 2023-08-11 NOTE — Transitions of Care (Post Inpatient/ED Visit) (Signed)
   08/11/2023  Name: Joel York MRN: [id number] DOB: 06-09-1986  Today's TOC FU Call Status:    Attempted to reach the patient regarding the most recent Inpatient/ED visit.  Follow Up Plan: Additional outreach attempts will be made to reach the patient to complete the Transitions of Care (Post Inpatient/ED visit) call.   Tangia Pinard J. Cristela Felt, RN, BSN, MSN Care Management Coordinator/[place] Phone Number:  [phone]

## 2023-08-12 ENCOUNTER — Encounter (HOSPITAL_COMMUNITY): Payer: Medicaid Other | Admitting: Internal Medicine

## 2023-08-12 ENCOUNTER — Telehealth: Payer: Self-pay

## 2023-08-12 ENCOUNTER — Other Ambulatory Visit: Payer: Self-pay | Admitting: *Deleted

## 2023-08-12 NOTE — Transitions of Care (Post Inpatient/ED Visit) (Signed)
08/12/2023  Name: Joel York MRN: [id number] DOB: October 18, 1986  Today's TOC FU Call Status: Today's TOC FU Call Status:: Successful TOC FU Call Completed TOC FU Call Complete Date: 08/12/23  Transition Care Management Follow-up Telephone Call Date of Discharge: 08/08/23 Discharge Facility: [facility]) Type of Discharge: Inpatient Admission Primary Inpatient Discharge Diagnosis:: "Heart Failure - HMIIILVAD placement" How have you been since you were released from the hospital?: Better Any questions or concerns?: No  Items Reviewed: Did you receive and understand the discharge instructions provided?: Yes Medications obtained,verified, and reconciled?: Yes (Medications Reviewed) Any new allergies since your discharge?: No Dietary orders reviewed?: Yes Type of Diet Ordered:: Low NA, heart healthy Do you have support at home?: Yes People in Home: spouse, parent(s) Name of Support/Comfort Primary Source: Wife and mother available to assist if needed.   Medications Reviewed Today: Medications Reviewed Today     Reviewed by Amada Kingfisher, RN (Registered Nurse) on 08/12/23 at 1032  Med List Status: <None>   Medication Order Taking? Sig Documenting Provider Last Dose Status Informant  acetaminophen (TYLENOL) 325 MG tablet [id number] Yes Take 2 tablets (650 mg total) by mouth every 4 (four) hours as needed for headache or mild pain. Clegg, Amy D, NP Taking Active   albuterol (PROVENTIL HFA;VENTOLIN HFA) 108 (90 Base) MCG/ACT inhaler [id number] Yes Inhale 2 puffs into the lungs every 6 (six) hours as needed for wheezing or shortness of breath. Shortness of breath Noel Journey Taking Active Self  aspirin EC 81 MG tablet [id number] Yes Take 1 tablet (81 mg total) by mouth daily. Swallow whole. Tonye Becket D, NP Taking Active   azelastine (ASTELIN) 0.1 % nasal spray [id number] No Place 1 spray into both nostrils 2 (two) times daily. Use in each nostril as directed  Patient not  taking: Reported on 07/14/2023   Wallis Bamberg, PA-C Not Taking Active Self  calcium carbonate (TUMS - DOSED IN MG ELEMENTAL CALCIUM) 500 MG chewable tablet [id number] Yes Chew 1 tablet by mouth daily as needed for indigestion. [provider] Taking Active Self  digoxin (LANOXIN) 0.125 MG tablet [id number] Yes Take 1 tablet (0.125 mg total) by mouth daily. Tonye Becket D, NP Taking Active   docusate sodium (COLACE) 100 MG capsule [id number] No Take 2 capsules (200 mg total) by mouth daily for 5 days then as directed by MD  Patient not taking: Reported on 08/12/2023   Tonye Becket D, NP Not Taking Active   doxycycline (VIBRA-TABS) 100 MG tablet [id number] Yes Take 1 tablet (100 mg total) by mouth every 12 (twelve) hours for 14 days. Tonye Becket D, NP Taking Active   fluticasone (FLONASE) 50 MCG/ACT nasal spray [id number] Yes Place 1 spray into both nostrils daily.  Patient taking differently: Place 1 spray into both nostrils daily as needed for allergies.   Radford Pax, NP Taking Active Self  Fluticasone-Salmeterol (ADVAIR) 100-50 MCG/DOSE AEPB [id number] No Inhale 1 puff into the lungs every 12 (twelve) hours.  Patient not taking: Reported on 08/12/2023   Noel Journey Not Taking Active Self           Med Note (WHITE, [place] [place] Jul 14, 2023  5:22 AM) No fill hx, but pt said he still uses it  gabapentin (NEURONTIN) 300 MG capsule [id number] Yes Take 1 capsule (300 mg total) by mouth 3 (three) times daily. Tonye Becket D, NP Taking Active   ipratropium (ATROVENT) 0.03 % nasal spray [id number] No Place  2 sprays into both nostrils 2 (two) times daily.  Patient not taking: Reported on 08/12/2023   Wallis Bamberg, PA-C Not Taking Active Self  levocetirizine (XYZAL) 5 MG tablet [id number] No Take 1 tablet (5 mg total) by mouth every evening.  Patient not taking: Reported on 08/12/2023   Wallis Bamberg, [place] Not Taking Active Self  lidocaine 4 % [id number] Yes Place 2 patches onto the skin daily. Remove &  Discard patch within 12 hours or as directed by MD Filbert Schilder, Amy D, NP Taking Active   losartan (COZAAR) 25 MG tablet [id number] Yes Take 1 tablet (25 mg total) by mouth 2 (two) times daily. Tonye Becket D, NP Taking Active   mexiletine (MEXITIL) 200 MG capsule [id number]  Take 1 capsule (200 mg total) by mouth 2 (two) times daily. Clegg, Amy D, NP  Active   nitroGLYCERIN (NITROSTAT) 0.4 MG SL tablet [id number] Yes Place 1 tablet (0.4 mg total) under the tongue every 5 (five) minutes as needed for chest pain. Tonye Becket D, NP Taking Active   ondansetron (ZOFRAN-ODT) 4 MG disintegrating tablet [id number] No Take 1 tablet (4 mg total) by mouth every 8 (eight) hours as needed for nausea or vomiting.  Patient not taking: Reported on 08/12/2023   Minna Antis, MD Not Taking Active Self  pantoprazole (PROTONIX) 40 MG tablet [id number] Yes Take 1 tablet (40 mg total) by mouth daily. Clegg, Amy D, NP Taking Active   sertraline (ZOLOFT) 25 MG tablet [id number] Yes Take 1 tablet (25 mg total) by mouth daily. Tonye Becket D, NP Taking Active   SUBOXONE 8-2 MG FILM [id number] Yes Place 1 Film under the tongue daily. [provider] Taking Active   traMADol (ULTRAM) 50 MG tablet [id number] Yes Take 1-2 tablets (50-100 mg total) by mouth every 6 (six) hours as needed for moderate pain. Tonye Becket D, NP Taking Active   traZODone (DESYREL) 50 MG tablet [id number] Yes Take 1 tablet (50 mg total) by mouth at bedtime as needed for sleep. Tonye Becket D, NP Taking Active   warfarin (COUMADIN) 1 MG tablet [id number] Yes Take 1 tablet (1 mg total) by mouth daily at 4 PM. Lovett Sox, MD Taking Active             Home Care and Equipment/Supplies: Were Home Health Services Ordered?: No Any new equipment or medical supplies ordered?: No  Functional Questionnaire: Do you need assistance with bathing/showering or dressing?: No Do you need assistance with meal preparation?: No Do you need assistance with eating?:  No Do you have difficulty maintaining continence: No Do you need assistance with getting out of bed/getting out of a chair/moving?: No Do you have difficulty managing or taking your medications?: No  Follow up appointments reviewed: PCP Follow-up appointment confirmed?: Yes Follow-up Provider: Sep 15 2023 01:00 PM - Office Visit  [facility] - Donell Beers, [facility] Specialist Hospital Follow-up appointment confirmed?: Yes Date of Specialist follow-up appointment?: 08/14/23 Follow-Up Specialty Provider:: 09:00 AM - Ventricular Assist Device  [facility] - Dolores Patty, MD Do you need transportation to your follow-up appointment?: No Do you understand care options if your condition(s) worsen?: Yes-patient verbalized understanding  Current Barriers:  Chronic Disease Management support and education needs related to CHF   RNCM Clinical Goal(s):  Patient will verbalize understanding of plan for management of CHF as evidenced by patients active participation in treatment regimen  through collaboration with RN Care manager,  provider, and care team.   Interventions: Enroll in Transition of Care 30 day program for [facility] follow-up.   Evaluation of current treatment plan related to  self management and patient's adherence to plan as established by provider   Heart Failure Interventions:  (Status:  New goal.) Short Term Goal Basic overview and discussion of pathophysiology of Heart Failure reviewed Provided education on low sodium diet Assessed need for readable accurate scales in home Advised patient to weigh each morning after emptying bladder Reviewed role of diuretics in prevention of fluid overload and management of heart failure;   Patient Goals/Self-Care Activities: Take all medications as prescribed Attend all scheduled provider appointments Call pharmacy for medication refills 3-7 days in advance of running out of  medications Perform all self care activities independently  Call provider office for new concerns or questions  Attend scheduled appointment at the VAD/HF clinic as scheduled on 08/14/23 9 am - Ventricular Assist Device at [facility] Attend scheduled office visit at [facility] - 9/23/204 1 pm  Transitioned of Care 30 day program discussed and patient agreeable to follow-up by [facility].   Follow Up Plan:   RNCM next Transition of Care followup TELEPHONIC VISIT scheduled on Tuesday, 8/27 10 am.  Review and discuss self management goals as established.    Gabriele Loveland J. Cristela Felt, RN, BSN, MSN Care Management Coordinator/[place] Phone Number:  [phone]

## 2023-08-13 ENCOUNTER — Other Ambulatory Visit (HOSPITAL_COMMUNITY): Payer: Self-pay | Admitting: *Deleted

## 2023-08-13 DIAGNOSIS — I5023 Acute on chronic systolic (congestive) heart failure: Secondary | ICD-10-CM

## 2023-08-13 DIAGNOSIS — Z95811 Presence of heart assist device: Secondary | ICD-10-CM

## 2023-08-13 DIAGNOSIS — Z7901 Long term (current) use of anticoagulants: Secondary | ICD-10-CM

## 2023-08-14 ENCOUNTER — Ambulatory Visit (HOSPITAL_COMMUNITY)
Admit: 2023-08-14 | Discharge: 2023-08-14 | Disposition: A | Discharge status: Home / Self Care | Payer: Medicaid Other | Attending: Internal Medicine | Admitting: Internal Medicine

## 2023-08-14 ENCOUNTER — Ambulatory Visit (HOSPITAL_COMMUNITY): Payer: Self-pay | Admitting: Pharmacist

## 2023-08-14 VITALS — BP 92/0 | HR 113 | Ht 67.0 in | Wt 147.6 lb

## 2023-08-14 DIAGNOSIS — I5022 Chronic systolic (congestive) heart failure: Secondary | ICD-10-CM | POA: Diagnosis present

## 2023-08-14 DIAGNOSIS — I110 Hypertensive heart disease with heart failure: Secondary | ICD-10-CM | POA: Insufficient documentation

## 2023-08-14 DIAGNOSIS — F1120 Opioid dependence, uncomplicated: Secondary | ICD-10-CM | POA: Insufficient documentation

## 2023-08-14 DIAGNOSIS — I428 Other cardiomyopathies: Secondary | ICD-10-CM | POA: Insufficient documentation

## 2023-08-14 DIAGNOSIS — Z720 Tobacco use: Secondary | ICD-10-CM | POA: Diagnosis not present

## 2023-08-14 DIAGNOSIS — Z87891 Personal history of nicotine dependence: Secondary | ICD-10-CM | POA: Insufficient documentation

## 2023-08-14 DIAGNOSIS — Z7901 Long term (current) use of anticoagulants: Secondary | ICD-10-CM | POA: Diagnosis not present

## 2023-08-14 DIAGNOSIS — I493 Ventricular premature depolarization: Secondary | ICD-10-CM | POA: Diagnosis not present

## 2023-08-14 DIAGNOSIS — Z95811 Presence of heart assist device: Secondary | ICD-10-CM | POA: Diagnosis not present

## 2023-08-14 DIAGNOSIS — I5023 Acute on chronic systolic (congestive) heart failure: Secondary | ICD-10-CM | POA: Insufficient documentation

## 2023-08-14 DIAGNOSIS — I5082 Biventricular heart failure: Secondary | ICD-10-CM | POA: Diagnosis not present

## 2023-08-14 LAB — BASIC METABOLIC PANEL
Anion gap: 11 (ref 5–15)
BUN: 10 mg/dL (ref 6–20)
CO2: 25 mmol/L (ref 22–32)
Calcium: 8.7 mg/dL — ABNORMAL LOW (ref 8.9–10.3)
Chloride: 101 mmol/L (ref 98–111)
Creatinine, Ser: 0.88 mg/dL (ref 0.61–1.24)
GFR, Estimated: >60 mL/min (ref >60)
Glucose, Bld: 113 mg/dL — ABNORMAL HIGH (ref 70–99)
Potassium: 3.6 mmol/L (ref 3.5–5.1)
Sodium: 137 mmol/L (ref 135–145)

## 2023-08-14 LAB — PROTIME-INR
INR: 1.3 — ABNORMAL HIGH (ref 0.8–1.2)
Prothrombin Time: 16.3 seconds — ABNORMAL HIGH (ref 11.4–15.2)

## 2023-08-14 LAB — CBC
HCT: 37.6 % — ABNORMAL LOW (ref 39.0–52.0)
Hemoglobin: 11.8 g/dL — ABNORMAL LOW (ref 13.0–17.0)
MCH: 28.2 pg (ref 26.0–34.0)
MCHC: 31.4 g/dL (ref 30.0–36.0)
MCV: 89.7 fL (ref 80.0–100.0)
Platelets: 770 10*3/uL — ABNORMAL HIGH (ref 150–400)
RBC: 4.19 MIL/uL — ABNORMAL LOW (ref 4.22–5.81)
RDW: 14.5 % (ref 11.5–15.5)
WBC: 10 10*3/uL (ref 4.0–10.5)
nRBC: 0 % (ref 0.0–0.2)

## 2023-08-14 LAB — LACTATE DEHYDROGENASE: LDH: 202 U/L — ABNORMAL HIGH (ref 98–192)

## 2023-08-14 MED ORDER — WARFARIN SODIUM 1 MG PO TABS: ORAL_TABLET | ORAL | 6 refills | Status: DC

## 2023-08-14 MED ORDER — ENOXAPARIN SODIUM 40 MG/0.4ML IJ SOSY: 40.0000 mg | PREFILLED_SYRINGE | Freq: Two times a day (BID) | INTRAMUSCULAR | 0 refills | Status: DC

## 2023-08-14 MED ORDER — CYCLOBENZAPRINE HCL 5 MG PO TABS: 5.0000 mg | ORAL_TABLET | Freq: Two times a day (BID) | ORAL | 0 refills | Status: DC | PRN

## 2023-08-14 NOTE — Patient Instructions (Addendum)
No change in medications You may take Flexeril 5 mg twice a day as needed Lauren will call you about your Coumadin dose We will call you if your labs are abnormal Return to clinic next week

## 2023-08-14 NOTE — Progress Notes (Signed)
LVAD CLINIC NOTE  PCP: Pcp, No HF Cardiologist: DB  HPI:  Joel York is a 37 y.o. male with HTN, ADD, asthma, hx drug abuse (now on suboxone x5 yrs), and seizures. Admitted in 7/27 with acute systolic heart failure -> shock. EF 10%. 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   - LHC with normal coronaries. RHC 07/24: Low CO (Fick CO/CI 3.3/1.9) and PAPi 3.0 on milrinone 0.375 . Off NE - cMRI:  LV markedly dilated EF 10% RV 17% NICM  Presents to VAD Clinic for post-hospital f/u. Feeling much better. Still with some chest soreness but getting back to baseline. Went to [employer] over weekend and legs sore. Denies orthopnea or PND. No fevers, chills or problems with driveline (wife doing dressing changes). No bleeding, melena or neuro symptoms. No VAD alarms. Taking all meds as prescribed.    VAD Indication: Destination Therapy due to recent smoking    VAD interrogation & Equipment Management: Speed:5600 Flow: 4.4 Power:4.3 w    PI:3   Alarms: pump off, driveline disconnect on 8/17--see above Events: 5-10 daily  Fixed speed 5600 Low speed limit: 5300   Primary Controller:  Replace back up battery in 27 months. Back up controller:   Replace back up battery in 27 months.   Annual Equipment Maintenance on UBC/PM was performed on 07/30/23.    I reviewed the LVAD parameters from today and compared the results to the patient's prior recorded data. LVAD interrogation was NEGATIVE for significant power changes, NEGATIVE for clinical alarms and STABLE for PI events/speed drops. No programming changes were made and pump is functioning within specified parameters. Pt is performing daily controller and system monitor self tests along with completing weekly and monthly maintenance for LVAD equipment.   LVAD equipment check completed and is in good working order. Back-up equipment present. Charged back up battery and performed self-test on equipment.   Past Medical History:  Diagnosis Date    Acid reflux    ADHD (attention deficit hyperactivity disorder)    Asthma    Back pain    Seizures (HCC)    Resolved    Current Outpatient Medications  Medication Sig Dispense Refill   acetaminophen (TYLENOL) 325 MG tablet Take 2 tablets (650 mg total) by mouth every 4 (four) hours as needed for headache or mild pain.     albuterol (PROVENTIL HFA;VENTOLIN HFA) 108 (90 Base) MCG/ACT inhaler Inhale 2 puffs into the lungs every 6 (six) hours as needed for wheezing or shortness of breath. Shortness of breath 1 Inhaler 0   aspirin EC 81 MG tablet Take 1 tablet (81 mg total) by mouth daily. Swallow whole. 120 tablet 3   calcium carbonate (TUMS - DOSED IN MG ELEMENTAL CALCIUM) 500 MG chewable tablet Chew 1 tablet by mouth daily as needed for indigestion.     cyclobenzaprine (FLEXERIL) 5 MG tablet Take 1 tablet (5 mg total) by mouth 2 (two) times daily as needed for muscle spasms. 30 tablet 0   digoxin (LANOXIN) 0.125 MG tablet Take 1 tablet (0.125 mg total) by mouth daily. 30 tablet 6   doxycycline (VIBRA-TABS) 100 MG tablet Take 1 tablet (100 mg total) by mouth every 12 (twelve) hours for 14 days. 24 tablet 0   fluticasone (FLONASE) 50 MCG/ACT nasal spray Place 1 spray into both nostrils daily. (Patient taking differently: Place 1 spray into both nostrils daily as needed for allergies.) 15.8 mL 0   gabapentin (NEURONTIN) 300 MG capsule Take 1 capsule (300 mg  total) by mouth 3 (three) times daily. 90 capsule 6   lidocaine 4 % Place 2 patches onto the skin daily. Remove & Discard patch within 12 hours or as directed by MD 18 patch 0   losartan (COZAAR) 25 MG tablet Take 1 tablet (25 mg total) by mouth 2 (two) times daily. 60 tablet 6   mexiletine (MEXITIL) 200 MG capsule Take 1 capsule (200 mg total) by mouth 2 (two) times daily. 60 capsule 6   nitroGLYCERIN (NITROSTAT) 0.4 MG SL tablet Place 1 tablet (0.4 mg total) under the tongue every 5 (five) minutes as needed for chest pain. 25 tablet 12    pantoprazole (PROTONIX) 40 MG tablet Take 1 tablet (40 mg total) by mouth daily. 30 tablet 6   sertraline (ZOLOFT) 25 MG tablet Take 1 tablet (25 mg total) by mouth daily. 30 tablet 6   SUBOXONE 8-2 MG FILM Place 1 Film under the tongue daily.     traMADol (ULTRAM) 50 MG tablet Take 1-2 tablets (50-100 mg total) by mouth every 6 (six) hours as needed for moderate pain. 30 tablet 0   traZODone (DESYREL) 50 MG tablet Take 1 tablet (50 mg total) by mouth at bedtime as needed for sleep. 30 tablet 6   enoxaparin (LOVENOX) 40 MG/0.4ML injection Inject 0.4 mLs (40 mg total) into the skin every 12 (twelve) hours. 4 mL 0   ondansetron (ZOFRAN-ODT) 4 MG disintegrating tablet Take 1 tablet (4 mg total) by mouth every 8 (eight) hours as needed for nausea or vomiting. (Patient not taking: Reported on 08/12/2023) 20 tablet 0   warfarin (COUMADIN) 1 MG tablet Take 2 mg (2 tablets) every Monday and 1 mg (1 tablet) all other days or as directed by the HF Clinic 50 tablet 6   No current facility-administered medications for this encounter.      Vitals:   08/14/23 1034 08/14/23 1035  BP: 106/81 (!) 92/0  Pulse: (!) 113   SpO2: 95%   Weight: 67 kg (147 lb 9.6 oz)   Height: 5\' 7"  (1.702 m)    Vital Signs:  Doppler Pressure 92                Automatc BP: 106/81 (90) HR:113   SPO2:95  %   Weight: 147.6 lb w/o eqt Last weight: 144 lb Home weights: 143-148 lbs   Physical Exam: General:  NAD.  HEENT: normal  Neck: supple. JVP not elevated.  Carotids 2+ bilat; no bruits. No lymphadenopathy or thryomegaly appreciated. Cor: LVAD hum.  Lungs: Clear. Abdomen: soft, nontender, non-distended. No hepatosplenomegaly. No bruits or masses. Good bowel sounds. Driveline site clean. Anchor in place.  Extremities: no cyanosis, clubbing, rash. Warm no edema  Neuro: alert & oriented x 3. No focal deficits. Moves all 4 without problem    ASSESSMENT AND PLAN:    1. Acute biventricular systolic heart failure -  Admitted 7/24 NYHA IV symptoms .Echo EF <20%,  RV mildly reduced, - Etiology uncertain. ? 2/2 PVCs vs genetically mediated +/- hypertension.  - LHC with normal coronaries.  - cMRI:  LV markedly dilated EF 10% RV 17% NICM - Underwent HM-III VAD on 07/28/25 - NYHA I-II - Volume status looks good - Continue digoxin 0.125 mg daily - Continue losartan 25 mg BID  2. S/P HMIII LVAD  on 8/6 - VAD interrogated personally. Parameters stable. - MAPs look good - LDH 202 - Hgb 11.8 - INR 1.3 Goal 2.0-3.0 Discussed dosing with PharmD personally. -> bridge  with lovenox - DL site ok  - Had 1 power off alarm and we discussed   3. PVCs - snores, will need eventual sleep study  - Suppressed with Mexiletine 200 mg BID   4. Tobacco use - quit smoking in 7/24   5. Hx drug abuse - Off narcotics  - Continue suboxone  Total time spent 45 minutes. Over half that time spent discussing above.    [name], MD  10:16 PM

## 2023-08-14 NOTE — Progress Notes (Signed)
Patient presents for hosp follow up in VAD Clinic today. Reports no problems with VAD equipment or concerns with drive line.  Pt tells me that he has been doing well since d/c from the hospital. Pt went [place] [employer] game over the weekend. He is taking limited Tramadol and Tylenol for pain. He is asking for Flexeril today. This was sent in per Dr Donata Clay 5 mg bid prn. Pt was informed that we will not refill this prescription so he will need to use these sparingly.  Pt did have pump off, driveline disconnect alarm the day after discharge. Pt tells me that he was trying to get his controller into a new bag and he removed his driveline. Pt was re-educated today that he should NEVER remove the driveline as this stops his pump. He verbalized understanding and tells me that as soon as he did that he realized that it was the wrong thing to do.   Pts wife Fatima Blank has been doing his driveline dressing change every other day. See driveline documentation below.    Vital Signs:  Doppler Pressure 92   Automatc BP: 106/81 (90) HR:113   SPO2:95  %   Weight: 147.6 lb w/o eqt Last weight: 144 lb Home weights: 143-148 lbs   VAD Indication: Destination Therapy due to recent smoking    VAD interrogation & Equipment Management: Speed:5600 Flow: 4.4 Power:4.3 w    PI:3   Alarms: pump off, driveline disconnect on 8/17--see above Events: 5-10 daily  Fixed speed 5600 Low speed limit: 5300   Primary Controller:  Replace back up battery in 27 months. Back up controller:   Replace back up battery in 27 months.   Annual Equipment Maintenance on UBC/PM was performed on 07/30/23.    I reviewed the LVAD parameters from today and compared the results to the patient's prior recorded data. LVAD interrogation was NEGATIVE for significant power changes, NEGATIVE for clinical alarms and STABLE for PI events/speed drops. No programming changes were made and pump is functioning within specified parameters. Pt is  performing daily controller and system monitor self tests along with completing weekly and monthly maintenance for LVAD equipment.   LVAD equipment check completed and is in good working order. Back-up equipment present. Charged back up battery and performed self-test on equipment.    Exit Site Care: Drive line is being maintained every other day by pts wife Kendal.  Existing VAD dressing removed and site care performed using sterile technique. Drive line exit site cleaned with Chlora prep applicators x 2, allowed to dry, and gauze dressing with Silver strip applied. Drive line exit site well healing and unincorporated. The velour is fully implanted at exit site. Small amount of serous drainage. Slight erythema, no tenderness or rash noted. There were 2 sutures under the driveline. The distal suture was removed today. CT sutures removed today. Stabilization device present and accurately applied. Pt denies fever or chills. Pt was given a bottle of silver strips today. Continue every other day dressing changes.      Significant Events on VAD Support:    Device: n/a    BP & Labs:  MAP 92 - Doppler is reflecting MAP   Hgb 11.8 - No S/S of bleeding. Specifically denies melena/BRBPR or nosebleeds.   LDH stable at 202 with established baseline of 200-348. Denies tea-colored urine. No power elevations noted on interrogation.    Plan: No change in medications You may take Flexeril 5 mg twice a day as needed Continue  every other day dressing changes Lauren will call you about your Coumadin dose We will call you if your labs are abnormal Return to clinic next week with a chest xray   Carlton Adam RN VAD Coordinator    Office: [phone] 24/7 Emergency VAD Pager: [phone]

## 2023-08-14 NOTE — Progress Notes (Signed)
LVAD INR 

## 2023-08-19 ENCOUNTER — Other Ambulatory Visit (HOSPITAL_COMMUNITY): Payer: Self-pay

## 2023-08-19 ENCOUNTER — Other Ambulatory Visit: Payer: Medicaid Other

## 2023-08-19 ENCOUNTER — Telehealth: Payer: Self-pay

## 2023-08-19 DIAGNOSIS — Z95811 Presence of heart assist device: Secondary | ICD-10-CM

## 2023-08-19 DIAGNOSIS — Z7901 Long term (current) use of anticoagulants: Secondary | ICD-10-CM

## 2023-08-19 NOTE — Patient Outreach (Signed)
  Care Management  Transitions of Care Program Managed Medicaid Transitions of Care week 2  08/19/2023 Name: Joel York MRN: [id number] DOB: 1986/08/11  Subjective: Joel York is a 37 y.o. year old male who is a primary care patient of Pcp, No. The Care Management team was unable to reach the patient by phone to assess and address transitions of care needs.   Plan: Additional outreach attempts will be made to reach the patient enrolled in the [facility] Program (Post Inpatient/ED Visit).  Next scheduled RNCM transition of care follow-up call scheduled for Tuesday 9/3 at 10 am.   Joel York J. Cristela Felt, RN, BSN, MSN Care Management Coordinator/[place] Phone Number:  [phone]

## 2023-08-21 ENCOUNTER — Encounter (HOSPITAL_COMMUNITY): Payer: Self-pay | Admitting: Internal Medicine

## 2023-08-21 ENCOUNTER — Ambulatory Visit (HOSPITAL_COMMUNITY): Payer: Self-pay | Admitting: Pharmacist

## 2023-08-21 ENCOUNTER — Ambulatory Visit (HOSPITAL_COMMUNITY)
Admission: RE | Admit: 2023-08-21 | Discharge: 2023-08-21 | Disposition: A | Discharge status: Home / Self Care | Payer: Medicaid Other | Source: Ambulatory Visit | Attending: Internal Medicine | Admitting: Internal Medicine

## 2023-08-21 VITALS — BP 82/0 | HR 101

## 2023-08-21 DIAGNOSIS — I5082 Biventricular heart failure: Secondary | ICD-10-CM | POA: Diagnosis not present

## 2023-08-21 DIAGNOSIS — I493 Ventricular premature depolarization: Secondary | ICD-10-CM | POA: Diagnosis not present

## 2023-08-21 DIAGNOSIS — Z7901 Long term (current) use of anticoagulants: Secondary | ICD-10-CM

## 2023-08-21 DIAGNOSIS — R0789 Other chest pain: Secondary | ICD-10-CM | POA: Diagnosis not present

## 2023-08-21 DIAGNOSIS — Z48812 Encounter for surgical aftercare following surgery on the circulatory system: Secondary | ICD-10-CM | POA: Insufficient documentation

## 2023-08-21 DIAGNOSIS — Z95811 Presence of heart assist device: Secondary | ICD-10-CM

## 2023-08-21 DIAGNOSIS — Z4509 Encounter for adjustment and management of other cardiac device: Secondary | ICD-10-CM | POA: Diagnosis present

## 2023-08-21 DIAGNOSIS — I428 Other cardiomyopathies: Secondary | ICD-10-CM | POA: Diagnosis not present

## 2023-08-21 DIAGNOSIS — J90 Pleural effusion, not elsewhere classified: Secondary | ICD-10-CM | POA: Insufficient documentation

## 2023-08-21 DIAGNOSIS — F1120 Opioid dependence, uncomplicated: Secondary | ICD-10-CM | POA: Diagnosis not present

## 2023-08-21 DIAGNOSIS — I5022 Chronic systolic (congestive) heart failure: Secondary | ICD-10-CM | POA: Diagnosis not present

## 2023-08-21 DIAGNOSIS — I517 Cardiomegaly: Secondary | ICD-10-CM | POA: Diagnosis not present

## 2023-08-21 DIAGNOSIS — R0683 Snoring: Secondary | ICD-10-CM | POA: Diagnosis not present

## 2023-08-21 DIAGNOSIS — R918 Other nonspecific abnormal finding of lung field: Secondary | ICD-10-CM | POA: Diagnosis not present

## 2023-08-21 LAB — CBC
HCT: 37.7 % — ABNORMAL LOW (ref 39.0–52.0)
Hemoglobin: 11.8 g/dL — ABNORMAL LOW (ref 13.0–17.0)
MCH: 27.9 pg (ref 26.0–34.0)
MCHC: 31.3 g/dL (ref 30.0–36.0)
MCV: 89.1 fL (ref 80.0–100.0)
Platelets: 428 10*3/uL — ABNORMAL HIGH (ref 150–400)
RBC: 4.23 MIL/uL (ref 4.22–5.81)
RDW: 14.6 % (ref 11.5–15.5)
WBC: 9.6 10*3/uL (ref 4.0–10.5)
nRBC: 0 % (ref 0.0–0.2)

## 2023-08-21 LAB — BASIC METABOLIC PANEL
Anion gap: 10 (ref 5–15)
BUN: 18 mg/dL (ref 6–20)
CO2: 28 mmol/L (ref 22–32)
Calcium: 8.8 mg/dL — ABNORMAL LOW (ref 8.9–10.3)
Chloride: 100 mmol/L (ref 98–111)
Creatinine, Ser: 0.99 mg/dL (ref 0.61–1.24)
GFR, Estimated: >60 mL/min (ref >60)
Glucose, Bld: 82 mg/dL (ref 70–99)
Potassium: 4.2 mmol/L (ref 3.5–5.1)
Sodium: 138 mmol/L (ref 135–145)

## 2023-08-21 LAB — NICOTINE/COTININE METABOLITES
Cotinine: <1 ng/mL
Nicotine: <1 ng/mL

## 2023-08-21 LAB — LACTATE DEHYDROGENASE: LDH: 192 U/L (ref 98–192)

## 2023-08-21 LAB — DIGOXIN LEVEL: Digoxin Level: 0.3 ng/mL — ABNORMAL LOW (ref 0.8–2.0)

## 2023-08-21 LAB — PROTIME-INR
INR: 1.2 (ref 0.8–1.2)
Prothrombin Time: 15.6 seconds — ABNORMAL HIGH (ref 11.4–15.2)

## 2023-08-21 MED ORDER — WARFARIN SODIUM 1 MG PO TABS: ORAL_TABLET | ORAL | 6 refills | Status: DC

## 2023-08-21 NOTE — Progress Notes (Signed)
Patient presents for 1 week follow up in VAD Clinic today. Reports no problems with VAD equipment or concerns with drive line.  Pt tells me that he has been doing well since d/c from the hospital. Pt states he is progressively able to do more week to week. Denies lightheadedness, dizziness, falls, shortness of breath, and signs of bleeding. States overall pain has improved. Pt states he is having pain in his right pectoral muscles after trying to catch his child from falling. He reports constant gnagging pain that acutely increases with certain movements. Mid-sternal incision clean,dry and intact. CXR obtained today reviewed in clinic with Dr. Gala Romney. Per MD if pain persists will refer to sports medicine.   He is taking limited Tramadol,Tylenol and Flexeril for pain. MD advised pt to take Tramadol 100mg  BID for right chest pain. Prescription called into [facility] on [address] per pt's request.  Pts wife Fatima Blank has been doing his driveline dressing change every other day. See driveline documentation below.    Vital Signs:  Doppler Pressure 82   Automatc BP: 92/76 (82) HR:101   SPO2:99 % RA   Weight: 144.2 lb w/ eqt Last weight: 147.6 lb Home weights: 143-148 lbs   VAD Indication: Destination Therapy due to recent smoking    VAD interrogation & Equipment Management: Speed:5600 Flow: 4.3 Power:4.4 w    PI:4.2   Alarms: none Events: 10-20 daily  Fixed speed 5600 Low speed limit: 5300   Primary Controller:  Replace back up battery in 27 months. Back up controller:   Replace back up battery in 27 months.   Annual Equipment Maintenance on UBC/PM was performed on 07/30/23.    I reviewed the LVAD parameters from today and compared the results to the patient's prior recorded data. LVAD interrogation was NEGATIVE for significant power changes, NEGATIVE for clinical alarms and STABLE for PI events/speed drops. No programming changes were made and pump is functioning within specified  parameters. Pt is performing daily controller and system monitor self tests along with completing weekly and monthly maintenance for LVAD equipment.   LVAD equipment check completed and is in good working order. Back-up equipment present. Charged back up battery and performed self-test on equipment.    Exit Site Care: Drive line is being maintained every other day by pts wife Kendal.  Existing VAD dressing removed and site care performed using sterile technique. Drive line exit site cleaned with Chlora prep applicators x 2, allowed to dry, and gauze dressing with Silver strip applied. Drive line exit site well healing and unincorporated. The velour is fully implanted at exit site. No drainage noted at site. Slight erythema, no tenderness or rash noted. The proximal suture was removed today. No remaining sutures at drive line site.Stabilization device present and accurately applied. Pt denies fever or chills. Pt advanced to twice a week dressing changes. Pt given 4 anchors for home use.       Significant Events on VAD Support:    Device: n/a    BP & Labs:  MAP 82 - Doppler is reflecting MAP   Hgb pending - No S/S of bleeding. Specifically denies melena/BRBPR or nosebleeds.   LDH stable at pending with established baseline of 200-348. Denies tea-colored urine. No power elevations noted on interrogation.    Plan: Advance to twice a week dressing changes Will refill Tramadol for pain Coumadin dosing per Leot[facility] Sh[facility] Return in 1 month  Simmie Davies RN,BSN VAD Coordinator    Office: [phone] 24/7 Emergency VAD Pager: [phone]

## 2023-08-21 NOTE — Progress Notes (Signed)
LVAD CLINIC NOTE  PCP: Pcp, No HF Cardiologist: DB  HPI:  Joel York is a 37 y.o. male with HTN, ADD, asthma, hx drug abuse (now on suboxone x5 yrs), and seizures. Admitted in 7/27 with acute systolic heart failure -> shock. EF 10%. 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   - LHC with normal coronaries. RHC 07/24: Low CO (Fick CO/CI 3.3/1.9) and PAPi 3.0 on milrinone 0.375 . Off NE - cMRI:  LV markedly dilated EF 10% RV 17% NICM  Presents to V[facility] Clinic for f/u. Overall feeling good. Getting stronger. More active. Was trying to catch one of his kids and hurt his right chest muscle. CXR today looks ok. Denies orthopnea or PND. No fevers, chills or problems with driveline. No bleeding, melena or neuro symptoms. No VAD alarms. Taking all meds as prescribed.      VAD Indication: Destination Therapy due to recent smoking    VAD interrogation & Equipment Management: Speed:5600 Flow: 4.4 Power:4.3 w    PI:3   Alarms: pump off, driveline disconnect on 8/17--see above Events: 5-10 daily  Fixed speed 5600 Low speed limit: 5300   Primary Controller:  Replace back up battery in 27 months. Back up controller:   Replace back up battery in 27 months.   Annual Equipment Maintenance on UBC/PM was performed on 07/30/23.    I reviewed the LVAD parameters from today and compared the results to the patient's prior recorded data. LVAD interrogation was NEGATIVE for significant power changes, NEGATIVE for clinical alarms and STABLE for PI events/speed drops. No programming changes were made and pump is functioning within specified parameters. Pt is performing daily controller and system monitor self tests along with completing weekly and monthly maintenance for LVAD equipment.   LVAD equipment check completed and is in good working order. Back-up equipment present. Charged back up battery and performed self-test on equipment.   Past Medical History:  Diagnosis Date   Acid reflux    ADHD (attention  deficit hyperactivity disorder)    Asthma    Back pain    Seizures (HCC)    Resolved    Current Outpatient Medications  Medication Sig Dispense Refill   acetaminophen (TYLENOL) 325 MG tablet Take 2 tablets (650 mg total) by mouth every 4 (four) hours as needed for headache or mild pain.     albuterol (PROVENTIL HFA;VENTOLIN HFA) 108 (90 Base) MCG/ACT inhaler Inhale 2 puffs into the lungs every 6 (six) hours as needed for wheezing or shortness of breath. Shortness of breath 1 Inhaler 0   aspirin EC 81 MG tablet Take 1 tablet (81 mg total) by mouth daily. Swallow whole. 120 tablet 3   calcium carbonate (TUMS - DOSED IN MG ELEMENTAL CALCIUM) 500 MG chewable tablet Chew 1 tablet by mouth daily as needed for indigestion.     cyclobenzaprine (FLEXERIL) 5 MG tablet Take 1 tablet (5 mg total) by mouth 2 (two) times daily as needed for muscle spasms. 30 tablet 0   digoxin (LANOXIN) 0.125 MG tablet Take 1 tablet (0.125 mg total) by mouth daily. 30 tablet 6   fluticasone (FLONASE) 50 MCG/ACT nasal spray Place 1 spray into both nostrils daily. (Patient taking differently: Place 1 spray into both nostrils daily as needed for allergies.) 15.8 mL 0   gabapentin (NEURONTIN) 300 MG capsule Take 1 capsule (300 mg total) by mouth 3 (three) times daily. 90 capsule 6   lidocaine 4 % Place 2 patches onto the skin daily. Remove & Discard  patch within 12 hours or as directed by MD 18 patch 0   losartan (COZAAR) 25 MG tablet Take 1 tablet (25 mg total) by mouth 2 (two) times daily. 60 tablet 6   mexiletine (MEXITIL) 200 MG capsule Take 1 capsule (200 mg total) by mouth 2 (two) times daily. 60 capsule 6   ondansetron (ZOFRAN-ODT) 4 MG disintegrating tablet Take 1 tablet (4 mg total) by mouth every 8 (eight) hours as needed for nausea or vomiting. 20 tablet 0   pantoprazole (PROTONIX) 40 MG tablet Take 1 tablet (40 mg total) by mouth daily. 30 tablet 6   sertraline (ZOLOFT) 25 MG tablet Take 1 tablet (25 mg total) by  mouth daily. 30 tablet 6   SUBOXONE 8-2 MG FILM Place 1 Film under the tongue daily.     traMADol (ULTRAM) 50 MG tablet Take 1-2 tablets (50-100 mg total) by mouth every 6 (six) hours as needed for moderate pain. 30 tablet 0   traZODone (DESYREL) 50 MG tablet Take 1 tablet (50 mg total) by mouth at bedtime as needed for sleep. 30 tablet 6   warfarin (COUMADIN) 1 MG tablet Take 2 mg (2 tablets) every Monday and 1 mg (1 tablet) all other days or as directed by the HF Clinic 50 tablet 6   enoxaparin (LOVENOX) 40 MG/0.4ML injection Inject 0.4 mLs (40 mg total) into the skin every 12 (twelve) hours. (Patient not taking: Reported on 08/21/2023) 4 mL 0   No current facility-administered medications for this encounter.      Vitals:   08/21/23 1333 08/21/23 1334  BP: 92/76 (!) 82/0  Pulse: (!) 101   SpO2: 99%      Vital Signs:  Doppler Pressure 82                Automatc BP: 92/76 (82) HR:101   SPO2:99 % RA   Weight: 144.2 lb w/ eqt Last weight: 147.6 lb Home weights: 143-148 lbs   Physical Exam: General:  NAD.  HEENT: normal  Neck: supple. JVP not elevated.  Carotids 2+ bilat; no bruits. No lymphadenopathy or thryomegaly appreciated. Cor: LVAD hum.  Tender over left pec. No obvious deformity Lungs: Clear. Abdomen: obese soft, nontender, non-distended. No hepatosplenomegaly. No bruits or masses. Good bowel sounds. Driveline site clean. Anchor in place.  Extremities: no cyanosis, clubbing, rash. Warm no edema  Neuro: alert & oriented x 3. No focal deficits. Moves all 4 without problem    ASSESSMENT AND PLAN:    1. Chronic biventricular systolic heart failure - Admitted 7/24 NYHA IV symptoms .Echo EF <20%,  RV mildly reduced, - Etiology uncertain. ? 2/2 PVCs vs genetically mediated +/- hypertension.  - LHC with normal coronaries.  - cMRI:  LV markedly dilated EF 10% RV 17% NICM - Underwent HM-III VAD on 07/28/25 - NYHA I  - Volume status looks good  - Continue digoxin 0.125 mg  daily - Continue losartan 25 mg BID  2. S/P HMIII LVAD  on 8/6 - VAD interrogated personally. Parameters stable. - MAPs ok - LDH pending - Hgb pending - INR pending Goal 2.0-3.0 Will d/w pharmD when INR results - DL site ook   3. PVCs - snores, will need eventual sleep study  - Suppressed with Mexiletine 200 mg BID   4. Tobacco use - quit smoking in 7/24   5. Hx drug abuse - Continue suboxone  6. R pectoral injury - CXR reviewed personally -> ok - will give tramadol 20 tabs -  if persists can send to [facility] - need to be careful with prescribing pain meds given history and we don't want him to relapse  Total time spent 45 minutes. Over half that time spent discussing above.    [name], MD  2:20 PM

## 2023-08-21 NOTE — Progress Notes (Signed)
Patient presents for 1 week follow up in VAD Clinic today. Reports no problems with VAD equipment or concerns with drive line.  Pt tells me that he has been doing well since d/c from the hospital. Pt states he is progressively able to do more week to week. Denies lightheadedness, dizziness, falls, shortness of breath, and signs of bleeding. States overall pain has improved. Pt states he is having pain in his right pectoral muscles after trying to catch his child. He reports constant gnagging pain. Mid-sternal incision clean,dry and intact.   He is taking limited Tramadol and Tylenol for pain. He is asking for Flexeril today. This was sent in per Dr Donata Clay 5 mg bid prn. Pt was informed that we will not refill this prescription so he will need to use these sparingly.   Pts wife Fatima Blank has been doing his driveline dressing change every other day. See driveline documentation below.    Vital Signs:  Doppler Pressure 82   Automatc BP: 92/76 (82) HR:101   SPO2:99 % RA   Weight: 144.2 lb w/ eqt Last weight: 147.6 lb Home weights: 143-148 lbs   VAD Indication: Destination Therapy due to recent smoking    VAD interrogation & Equipment Management: Speed:5600 Flow: 4.4 Power:4.3 w    PI:3   Alarms: pump off, driveline disconnect on 8/17--see above Events: 5-10 daily  Fixed speed 5600 Low speed limit: 5300   Primary Controller:  Replace back up battery in 27 months. Back up controller:   Replace back up battery in 27 months.   Annual Equipment Maintenance on UBC/PM was performed on 07/30/23.    I reviewed the LVAD parameters from today and compared the results to the patient's prior recorded data. LVAD interrogation was NEGATIVE for significant power changes, NEGATIVE for clinical alarms and STABLE for PI events/speed drops. No programming changes were made and pump is functioning within specified parameters. Pt is performing daily controller and system monitor self tests along with  completing weekly and monthly maintenance for LVAD equipment.   LVAD equipment check completed and is in good working order. Back-up equipment present. Charged back up battery and performed self-test on equipment.    Exit Site Care: Drive line is being maintained every other day by pts wife Kendal.  Existing VAD dressing removed and site care performed using sterile technique. Drive line exit site cleaned with Chlora prep applicators x 2, allowed to dry, and gauze dressing with Silver strip applied. Drive line exit site well healing and unincorporated. The velour is fully implanted at exit site. Small amount of serous drainage. Slight erythema, no tenderness or rash noted. There were 2 sutures under the driveline. The distal suture was removed today. CT sutures removed today. Stabilization device present and accurately applied. Pt denies fever or chills. Pt was given a bottle of silver strips today. Continue every other day dressing changes.      Significant Events on VAD Support:    Device: n/a    BP & Labs:  MAP 92 - Doppler is reflecting MAP   Hgb 11.8 - No S/S of bleeding. Specifically denies melena/BRBPR or nosebleeds.   LDH stable at 202 with established baseline of 200-348. Denies tea-colored urine. No power elevations noted on interrogation.    Plan: No change in medications You may take Flexeril 5 mg twice a day as needed Continue every other day dressing changes Lauren will call you about your Coumadin dose We will call you if your labs are abnormal Return  to clinic next week with a chest xray   Carlton Adam RN VAD Coordinator    Office: [phone] 24/7 Emergency VAD Pager: [phone]

## 2023-08-21 NOTE — Patient Instructions (Signed)
Advance to twice a week dressing changes Will refill Tramadol for pain Coumadin dosing per Leotis Shames [facility] Return in 1 month

## 2023-08-22 ENCOUNTER — Other Ambulatory Visit (HOSPITAL_COMMUNITY): Payer: Self-pay | Admitting: *Deleted

## 2023-08-22 DIAGNOSIS — Z95811 Presence of heart assist device: Secondary | ICD-10-CM

## 2023-08-22 DIAGNOSIS — Z7901 Long term (current) use of anticoagulants: Secondary | ICD-10-CM

## 2023-08-26 ENCOUNTER — Other Ambulatory Visit: Payer: Medicaid Other

## 2023-08-26 ENCOUNTER — Telehealth: Payer: Self-pay

## 2023-08-26 NOTE — Patient Outreach (Signed)
  Care Management  Transitions of Care Program Transitions of Care Post-discharge week 3  08/26/2023 Name: Santhiago Panek MRN: [id number] DOB: 1986/01/03  Subjective: Joel York is a 37 y.o. year old male who is a primary care patient of Pcp, No. The Care Management team was unable to reach the patient by phone to assess and address transitions of care needs. Left HIPAA compliant voicemail message, RNCM contact information provided encouraging return call.   Plan: No further outreach attempts will be made at this time.  We have been unable to reach the patient.  Vernadine Coombs J. Cristela Felt, RN, BSN, MSN Care Management Coordinator/[place] Phone Number:  [phone]

## 2023-08-26 NOTE — Patient Outreach (Signed)
  Care Management  Transitions of Care Program Transitions of Care Post-discharge week 3  08/26/2023 Name: Joel York MRN: [id number] DOB: 27-Jul-1986  Subjective: Joel York is a 37 y.o. year old male who is a primary care patient of Pcp, No. The Care Management team was unable to reach the patient by phone to assess and address transitions of care needs.   Plan: Additional outreach attempts will be made to reach the patient enrolled in the [facility] Program (Post Inpatient/ED Visit).  Next scheduled TOC 9/10appointment with Alyse Low, RNCM - Tuesday, 09/12/2023.   Larah Kuntzman J. Cristela Felt, RN, BSN, MSN Care Management Coordinator/[place] Phone Number:  [phone]

## 2023-08-27 ENCOUNTER — Ambulatory Visit (HOSPITAL_COMMUNITY): Payer: Self-pay | Admitting: Pharmacist

## 2023-08-27 ENCOUNTER — Ambulatory Visit (HOSPITAL_COMMUNITY)
Admission: RE | Admit: 2023-08-27 | Discharge: 2023-08-27 | Disposition: A | Discharge status: Home / Self Care | Payer: Medicaid Other | Source: Ambulatory Visit | Attending: Cardiology | Admitting: Cardiology

## 2023-08-27 DIAGNOSIS — Z7901 Long term (current) use of anticoagulants: Secondary | ICD-10-CM | POA: Insufficient documentation

## 2023-08-27 DIAGNOSIS — Z95811 Presence of heart assist device: Secondary | ICD-10-CM | POA: Insufficient documentation

## 2023-08-27 LAB — PROTIME-INR
INR: 1.8 — ABNORMAL HIGH (ref 0.8–1.2)
Prothrombin Time: 21.5 s — ABNORMAL HIGH (ref 11.4–15.2)

## 2023-09-02 ENCOUNTER — Other Ambulatory Visit: Payer: Medicaid Other

## 2023-09-02 ENCOUNTER — Telehealth: Payer: Self-pay

## 2023-09-02 NOTE — Patient Outreach (Signed)
  Care Management  Transitions of Care Program Managed Medicaid Transitions of Care week 2  09/02/2023 Name: [name] MRN: [id number] DOB: 1986/08/28  Subjective: Joel York is a 37 y.o. year old male who is a primary care patient of Pcp, No. The Care Management team was unable to reach the patient by phone to assess and address transitions of care needs.   Plan: Additional outreach attempts will be made to reach the patient enrolled in the [facility] Program (Post Inpatient/ED Visit).  Alyse Low, RN, BA, [facility], CRRN [facility] Coordinator, Transition of Care Ph # [phone]

## 2023-09-05 ENCOUNTER — Other Ambulatory Visit (HOSPITAL_COMMUNITY): Payer: Self-pay | Admitting: *Deleted

## 2023-09-05 DIAGNOSIS — Z95811 Presence of heart assist device: Secondary | ICD-10-CM

## 2023-09-05 DIAGNOSIS — Z7901 Long term (current) use of anticoagulants: Secondary | ICD-10-CM

## 2023-09-09 ENCOUNTER — Other Ambulatory Visit: Payer: Medicaid Other

## 2023-09-09 ENCOUNTER — Telehealth: Payer: Self-pay

## 2023-09-09 NOTE — Patient Outreach (Signed)
Care Management  Transitions of Care Program Managed Medicaid Transitions of Care final unsuccessful follow up (x3)  09/09/2023 Name: Joel York MRN: [id number] DOB: 04-21-1986  Subjective: Joel York is a 37 y.o. year old male who is a primary care patient of Pcp, No. The Care Management team was unable to reach the patient by phone to assess and address transitions of care needs.   Plan: No further outreach attempts will be made at this time.  We have been unable to reach the patient.  Alyse Low, RN, BA, [facility], CRRN [facility] Coordinator, Transition of Care Ph # [phone]

## 2023-09-10 ENCOUNTER — Other Ambulatory Visit (HOSPITAL_COMMUNITY): Payer: Medicaid Other

## 2023-09-13 ENCOUNTER — Other Ambulatory Visit (HOSPITAL_BASED_OUTPATIENT_CLINIC_OR_DEPARTMENT_OTHER): Payer: Self-pay

## 2023-09-15 ENCOUNTER — Other Ambulatory Visit: Payer: Self-pay

## 2023-09-15 ENCOUNTER — Other Ambulatory Visit (HOSPITAL_COMMUNITY): Payer: Self-pay

## 2023-09-15 ENCOUNTER — Other Ambulatory Visit (HOSPITAL_BASED_OUTPATIENT_CLINIC_OR_DEPARTMENT_OTHER): Payer: Self-pay

## 2023-09-15 ENCOUNTER — Ambulatory Visit: Payer: Self-pay | Admitting: Nurse Practitioner

## 2023-09-15 NOTE — Progress Notes (Deleted)
New Patient Office Visit  Subjective:  Patient ID: Joel York, male    DOB: 11-Sep-1986  Age: 37 y.o. MRN: [id number]  CC: No chief complaint on file.   HPI Joel York is a 37 y.o. male with past medical history of *** presents for establishing care.  Past Medical History:  Diagnosis Date   Acid reflux    ADHD (attention deficit hyperactivity disorder)    Asthma    Back pain    Seizures (HCC)    Resolved    Past Surgical History:  Procedure Laterality Date   CHOLECYSTECTOMY     INSERTION OF IMPLANTABLE LEFT VENTRICULAR ASSIST DEVICE N/A 07/30/2023   Procedure: INSERTION OF IMPLANTABLE LEFT VENTRICULAR ASSIST DEVICE;  Surgeon: Lovett Sox, MD;  Location: [facility] OR;  Service: Open Heart Surgery;  Laterality: N/A;   IR THORACENTESIS ASP PLEURAL SPACE W/IMG GUIDE  08/08/2023   RIGHT HEART CATH N/A 07/28/2023   Procedure: RIGHT HEART CATH;  Surgeon: Dolores Patty, MD;  Location: [facility] INVASIVE CV LAB;  Service: Cardiovascular;  Laterality: N/A;   RIGHT/LEFT HEART CATH AND CORONARY ANGIOGRAPHY N/A 07/16/2023   Procedure: RIGHT/LEFT HEART CATH AND CORONARY ANGIOGRAPHY;  Surgeon: Dolores Patty, MD;  Location: [facility] INVASIVE CV LAB;  Service: Cardiovascular;  Laterality: N/A;   TEE WITHOUT CARDIOVERSION N/A 07/30/2023   Procedure: TRANSESOPHAGEAL ECHOCARDIOGRAM;  Surgeon: Lovett Sox, MD;  Location: [facility] OR;  Service: Open Heart Surgery;  Laterality: N/A;   TOOTH EXTRACTION N/A 07/23/2023   Procedure: DENTAL RESTORATION/EXTRACTIONS;  Surgeon: Ocie Doyne, DMD;  Location: [facility] OR;  Service: Oral Surgery;  Laterality: N/A;   WISDOM TOOTH EXTRACTION      Family History  Problem Relation Age of Onset   Crohn's disease Maternal Grandmother    Crohn's disease Maternal Uncle    Hypertension Mother        Living   Congestive Heart Failure Maternal Grandmother    Hypertension Other        Maternal Aunts & Uncles    Social History   Socioeconomic History   Marital status: Married     Spouse name: Not on file   Number of children: Not on file   Years of education: Not on file   Highest education level: Not on file  Occupational History   Not on file  Tobacco Use   Smoking status: Every Day    Types: Cigarettes   Smokeless tobacco: Current    Types: Chew  Substance and Sexual Activity   Alcohol use: Not Currently   Drug use: Not Currently    Comment: last use "many years ago"   Sexual activity: Yes  Other Topics Concern   Not on file  Social History Narrative   Not on file   Social Determinants of Health   Financial Resource Strain: Medium Risk (08/12/2023)   Overall Financial Resource Strain (CARDIA)    Difficulty of Paying Living Expenses: Somewhat hard  Food Insecurity: No Food Insecurity (08/12/2023)   Hunger Vital Sign    Worried About Running Out of Food in the Last Year: Never true    Ran Out of Food in the Last Year: Never true  Transportation Needs: No Transportation Needs (08/12/2023)   PRAPARE - Administrator, Civil Service (Medical): No    Lack of Transportation (Non-Medical): No  Physical Activity: Sufficiently Active (07/14/2023)   Exercise Vital Sign    Days of Exercise per Week: 3 days    Minutes of Exercise per Session: 60  min  Stress: Stress Concern Present (07/14/2023)   [employer] of Occupational Health - Occupational Stress Questionnaire    Feeling of Stress : To some extent  Social Connections: Not on file  Intimate Partner Violence: Not At Risk (07/14/2023)   Humiliation, Afraid, Rape, and Kick questionnaire    Fear of Current or Ex-Partner: No    Emotionally Abused: No    Physically Abused: No    Sexually Abused: No    ROS Review of Systems  Objective:   Today's Vitals: There were no vitals taken for this visit.  Physical Exam  Assessment & Plan:   Problem List Items Addressed This Visit   None   Outpatient Encounter Medications as of 09/15/2023  Medication Sig   acetaminophen (TYLENOL) 325  MG tablet Take 2 tablets (650 mg total) by mouth every 4 (four) hours as needed for headache or mild pain.   albuterol (PROVENTIL HFA;VENTOLIN HFA) 108 (90 Base) MCG/ACT inhaler Inhale 2 puffs into the lungs every 6 (six) hours as needed for wheezing or shortness of breath. Shortness of breath   aspirin EC 81 MG tablet Take 1 tablet (81 mg total) by mouth daily. Swallow whole.   calcium carbonate (TUMS - DOSED IN MG ELEMENTAL CALCIUM) 500 MG chewable tablet Chew 1 tablet by mouth daily as needed for indigestion.   cyclobenzaprine (FLEXERIL) 5 MG tablet Take 1 tablet (5 mg total) by mouth 2 (two) times daily as needed for muscle spasms.   digoxin (LANOXIN) 0.125 MG tablet Take 1 tablet (0.125 mg total) by mouth daily.   enoxaparin (LOVENOX) 40 MG/0.4ML injection Inject 0.4 mLs (40 mg total) into the skin every 12 (twelve) hours. (Patient not taking: Reported on 08/21/2023)   fluticasone (FLONASE) 50 MCG/ACT nasal spray Place 1 spray into both nostrils daily. (Patient taking differently: Place 1 spray into both nostrils daily as needed for allergies.)   gabapentin (NEURONTIN) 300 MG capsule Take 1 capsule (300 mg total) by mouth 3 (three) times daily.   lidocaine 4 % Place 2 patches onto the skin daily. Remove & Discard patch within 12 hours or as directed by MD   losartan (COZAAR) 25 MG tablet Take 1 tablet (25 mg total) by mouth 2 (two) times daily.   mexiletine (MEXITIL) 200 MG capsule Take 1 capsule (200 mg total) by mouth 2 (two) times daily.   ondansetron (ZOFRAN-ODT) 4 MG disintegrating tablet Take 1 tablet (4 mg total) by mouth every 8 (eight) hours as needed for nausea or vomiting.   pantoprazole (PROTONIX) 40 MG tablet Take 1 tablet (40 mg total) by mouth daily.   sertraline (ZOLOFT) 25 MG tablet Take 1 tablet (25 mg total) by mouth daily.   SUBOXONE 8-2 MG FILM Place 1 Film under the tongue daily.   traMADol (ULTRAM) 50 MG tablet Take 1-2 tablets (50-100 mg total) by mouth every 6 (six) hours  as needed for moderate pain.   traZODone (DESYREL) 50 MG tablet Take 1 tablet (50 mg total) by mouth at bedtime as needed for sleep.   warfarin (COUMADIN) 1 MG tablet Take 2 mg (2 tablets) daily or as directed by the HF Clinic   No facility-administered encounter medications on file as of 09/15/2023.    Follow-up: No follow-ups on file.   Renelda Loma, RMA

## 2023-09-16 ENCOUNTER — Ambulatory Visit (HOSPITAL_COMMUNITY): Payer: Self-pay | Admitting: Pharmacist

## 2023-09-16 ENCOUNTER — Ambulatory Visit (HOSPITAL_COMMUNITY)
Admission: RE | Admit: 2023-09-16 | Discharge: 2023-09-16 | Disposition: A | Discharge status: Home / Self Care | Payer: Medicaid Other | Source: Ambulatory Visit | Attending: Cardiology

## 2023-09-16 DIAGNOSIS — Z7901 Long term (current) use of anticoagulants: Secondary | ICD-10-CM | POA: Insufficient documentation

## 2023-09-16 DIAGNOSIS — Z4509 Encounter for adjustment and management of other cardiac device: Secondary | ICD-10-CM | POA: Diagnosis not present

## 2023-09-16 DIAGNOSIS — Z95811 Presence of heart assist device: Secondary | ICD-10-CM | POA: Insufficient documentation

## 2023-09-16 LAB — PROTIME-INR
INR: 1.9 — ABNORMAL HIGH (ref 0.8–1.2)
Prothrombin Time: 21.7 seconds — ABNORMAL HIGH (ref 11.4–15.2)

## 2023-09-17 ENCOUNTER — Other Ambulatory Visit (HOSPITAL_COMMUNITY): Payer: Self-pay

## 2023-09-17 DIAGNOSIS — Z95811 Presence of heart assist device: Secondary | ICD-10-CM

## 2023-09-17 DIAGNOSIS — Z7901 Long term (current) use of anticoagulants: Secondary | ICD-10-CM

## 2023-09-22 ENCOUNTER — Ambulatory Visit (HOSPITAL_COMMUNITY): Payer: Self-pay | Admitting: Pharmacist

## 2023-09-22 ENCOUNTER — Other Ambulatory Visit (HOSPITAL_COMMUNITY): Payer: Self-pay

## 2023-09-22 ENCOUNTER — Encounter (HOSPITAL_COMMUNITY): Payer: Self-pay | Admitting: Internal Medicine

## 2023-09-22 ENCOUNTER — Ambulatory Visit (HOSPITAL_COMMUNITY)
Admission: RE | Admit: 2023-09-22 | Discharge: 2023-09-22 | Disposition: A | Discharge status: Home / Self Care | Payer: Medicaid Other | Source: Ambulatory Visit | Attending: Internal Medicine | Admitting: Internal Medicine

## 2023-09-22 VITALS — BP 122/88 | HR 126 | Wt 151.2 lb

## 2023-09-22 DIAGNOSIS — Z720 Tobacco use: Secondary | ICD-10-CM | POA: Diagnosis not present

## 2023-09-22 DIAGNOSIS — Z7901 Long term (current) use of anticoagulants: Secondary | ICD-10-CM | POA: Diagnosis not present

## 2023-09-22 DIAGNOSIS — I5022 Chronic systolic (congestive) heart failure: Secondary | ICD-10-CM

## 2023-09-22 DIAGNOSIS — J4530 Mild persistent asthma, uncomplicated: Secondary | ICD-10-CM

## 2023-09-22 DIAGNOSIS — I493 Ventricular premature depolarization: Secondary | ICD-10-CM | POA: Diagnosis not present

## 2023-09-22 DIAGNOSIS — Z95811 Presence of heart assist device: Secondary | ICD-10-CM | POA: Diagnosis not present

## 2023-09-22 DIAGNOSIS — Z4801 Encounter for change or removal of surgical wound dressing: Secondary | ICD-10-CM | POA: Insufficient documentation

## 2023-09-22 LAB — CBC
HCT: 43.9 % (ref 39.0–52.0)
Hemoglobin: 14.2 g/dL (ref 13.0–17.0)
MCH: 28.1 pg (ref 26.0–34.0)
MCHC: 32.3 g/dL (ref 30.0–36.0)
MCV: 86.8 fL (ref 80.0–100.0)
Platelets: 306 10*3/uL (ref 150–400)
RBC: 5.06 MIL/uL (ref 4.22–5.81)
RDW: 14.4 % (ref 11.5–15.5)
WBC: 9.7 10*3/uL (ref 4.0–10.5)
nRBC: 0 % (ref 0.0–0.2)

## 2023-09-22 LAB — BASIC METABOLIC PANEL
Anion gap: 12 (ref 5–15)
BUN: 16 mg/dL (ref 6–20)
CO2: 27 mmol/L (ref 22–32)
Calcium: 9.1 mg/dL (ref 8.9–10.3)
Chloride: 100 mmol/L (ref 98–111)
Creatinine, Ser: 1.24 mg/dL (ref 0.61–1.24)
GFR, Estimated: >60 mL/min (ref >60)
Glucose, Bld: 130 mg/dL — ABNORMAL HIGH (ref 70–99)
Potassium: 3.4 mmol/L — ABNORMAL LOW (ref 3.5–5.1)
Sodium: 139 mmol/L (ref 135–145)

## 2023-09-22 LAB — DIGOXIN LEVEL: Digoxin Level: 0.2 ng/mL — ABNORMAL LOW (ref 0.8–2.0)

## 2023-09-22 LAB — PROTIME-INR
INR: 1.2 (ref 0.8–1.2)
Prothrombin Time: 15.1 s (ref 11.4–15.2)

## 2023-09-22 LAB — LACTATE DEHYDROGENASE: LDH: 171 U/L (ref 98–192)

## 2023-09-22 MED ORDER — IVABRADINE HCL 5 MG PO TABS: 5.0000 mg | ORAL_TABLET | Freq: Two times a day (BID) | ORAL | 11 refills | Status: DC

## 2023-09-22 MED ORDER — POTASSIUM CHLORIDE CRYS ER 20 MEQ PO TBCR
40.0000 meq | EXTENDED_RELEASE_TABLET | Freq: Once | ORAL | Status: AC
  Administered 2023-09-22: 40 meq via ORAL

## 2023-09-22 MED ORDER — ALBUTEROL SULFATE HFA 108 (90 BASE) MCG/ACT IN AERS: 2.0000 | INHALATION_SPRAY | Freq: Four times a day (QID) | RESPIRATORY_TRACT | 6 refills | Status: DC | PRN

## 2023-09-22 NOTE — Patient Instructions (Addendum)
Start Corlanor 5 mg twice daily for elevated heart rate Stop daily Aspirin  Coumadin dosing per Leotis Shames PharmD- Take 4 mg today and tomorrow. Then take 3 mg Monday/Friday, and 2 mg all other days. Take Lovenox injection x 2 today, and 2 doses tomorrow.  Cleanse exit site with VASHE solution moistened 2 x 2s. Do not use CHG swabs. Cover dressing with extra 4 x 4s  Return to VAD clinic in 1 week for wound check Return to VAD clinic in 6 weeks for follow up with Dr Gala Romney

## 2023-09-22 NOTE — Progress Notes (Signed)
Patient presents for 1 month follow up in VAD Clinic today. Reports no problems with VAD equipment or concerns with drive line.  Pt tells me that he has been doing well and is able to do what he wants. Denies lightheadedness, dizziness, falls, shortness of breath, and signs of bleeding. States overall pain has improved. Right chest pain has resolved and he is no longer taking Tramadol.   HR 131 ST on arrival to clinic. After resting for 20 minutes HR 116. BP mildly elevated. Per Dr Gala Romney will start Corlanor 5 mg BID. Will not make any changes to BP meds at this time. Prescription sent to pt's pharmacy. ASA stopped today per protocol.   Pts wife Fatima Blank has been doing his driveline dressing change 3 times a week. Reports significant itching and burning with CHG use. Rash/skin irritation noted under previous anchor and tape sites. Advised to no longer use CHG swabs and silver strips. Will cleanse with VASHE solution, and place 2 VASHE moistened 2 x 2 directly at exit site. Did not use tape from kit, instead covered dressing with large tegaderms. Placed cath grip anchor over large tegaderm to protect skin. Advised to change twice a week. See driveline documentation below. Spoke with Penni Bombard over Face-time about changes to dressing change regimen. Will have pt return to clinic next week for wound check.   Creatine elevated from baseline- 1.24 today. States he is drinking 2L daily. Per Dr Gala Romney pt to increase PO intake. He verbalized understanding. K 3.4 today- given 40 meq PO K in clinic today per Dr Gala Romney. See MAR for details. Will repeat BMET next week per Dr Gala Romney.    Vital Signs:  Doppler Pressure: 96   Automatc BP: 122/88 (95) HR: 126-131 ST on arrival. With rest- HR 116-121 SPO2: 98 % RA   Weight: 151.2 lb w/ eqt Last weight: 144.2 lb Home weights: 146-148 lbs   VAD Indication: Destination Therapy due to recent smoking    VAD interrogation & Equipment Management: Speed:  5600 Flow: 4.5 Power: 4.3 w    PI: 3.6   Alarms: none Events: 30-60 daily  Fixed speed 5600 Low speed limit: 5300   Primary Controller:  Replace back up battery in 26 months. Back up controller:   Replace back up battery in 27 months.   Annual Equipment Maintenance on UBC/PM was performed on 07/30/23.    I reviewed the LVAD parameters from today and compared the results to the patient's prior recorded data. LVAD interrogation was NEGATIVE for significant power changes, NEGATIVE for clinical alarms and STABLE for PI events/speed drops. No programming changes were made and pump is functioning within specified parameters. Pt is performing daily controller and system monitor self tests along with completing weekly and monthly maintenance for LVAD equipment.   LVAD equipment check completed and is in good working order. Back-up equipment present. Charged back up battery and performed self-test on equipment.    Exit Site Care: Drive line is being maintained 3 times a week by pt's wife Kendal.  Existing VAD dressing removed and site care performed using sterile technique. DO NOT USE CHG swabs or silver strip. Drive line exit site cleaned with VASHE moistened 2 x 2 twice, allowed to dry, and gauze dressing with 2 VASHE moistened 2 x 2s placed directly at exit site. Covered with 2 large tegaderms instead of tape. Drive line exit site partially healed and unincorporated. The velour is fully implanted at exit site. Scant serosanguinous drainage noted on previous dressing.  Slight erythema at exit site. Rash noted under previous tape/anchor sites. Rash covered with 4 x 4 dressing to prevent tegaderm from causing further irritation. Cath grip anchor applied for increased stabilization of drive line.  Pt denies fever or chills. Pt advanced to twice a week dressing changes. Provided with 5 cath grip anchors, several 2 x 2s, a box of 4 x 4s, 2 boxes of large tegaderms, and a bottle of VASHE solution for home use.            Significant Events on VAD Support:    Device: n/a    BP & Labs:  MAP 96 - Doppler is reflecting MAP   Hgb 14.2 - No S/S of bleeding. Specifically denies melena/BRBPR or nosebleeds.   LDH stable at 171 with established baseline of 200-348. Denies tea-colored urine. No power elevations noted on interrogation.    Plan: Start Corlanor 5 mg twice daily for elevated heart rate Stop daily Aspirin  Coumadin dosing per Leotis Shames PharmD- Take 4 mg today and tomorrow. Then take 3 mg Monday/Friday, and 2 mg all other days. Take Lovenox injection x 2 today, and 2 doses tomorrow.  Cleanse exit site with VASHE solution moistened 2 x 2s. Do not use CHG swabs. Cover dressing with extra 4 x 4s  Return to VAD clinic in 1 week for wound check Return to VAD clinic in 6 weeks for follow up with Dr Gala Romney  Alyce Pagan RN VAD Coordinator  Office: [phone]  24/7 Pager: [phone]

## 2023-09-22 NOTE — Progress Notes (Signed)
LVAD INR 

## 2023-09-25 LAB — NICOTINE/COTININE METABOLITES
Cotinine: 30.1 ng/mL
Nicotine: 2.5 ng/mL

## 2023-09-26 ENCOUNTER — Other Ambulatory Visit (HOSPITAL_COMMUNITY): Payer: Self-pay | Admitting: Unknown Physician Specialty

## 2023-09-26 DIAGNOSIS — Z7901 Long term (current) use of anticoagulants: Secondary | ICD-10-CM

## 2023-09-26 DIAGNOSIS — Z95811 Presence of heart assist device: Secondary | ICD-10-CM

## 2023-09-28 NOTE — Progress Notes (Addendum)
LVAD CLINIC NOTE  PCP: Pcp, No HF Cardiologist: DB  HPI:  Joel York is a 37 y.o. male with HTN, ADD, asthma, hx drug abuse (now on suboxone x5 yrs), and seizures. Admitted in 7/27 with acute systolic heart failure -> shock. EF 10%. 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   - LHC with normal coronaries. RHC 07/24: Low CO (Fick CO/CI 3.3/1.9) and PAPi 3.0 on milrinone 0.375 . Off NE - cMRI:  LV markedly dilated EF 10% RV 17% NICM  Presents to VAD Clinic for f/u. Overall feeling pretty good. Getting stronger. Doing all ADLs without problem. Notes itching and some redness around DL site. No fevers or chills.Denies orthopnea or PND.Marland Kitchen No bleeding, melena or neuro symptoms. No VAD alarms. Taking all meds as prescribed.     VAD Indication: Destination Therapy due to recent smoking    VAD interrogation & Equipment Management: Speed: 5600 Flow: 4.5 Power: 4.3 w    PI: 3.6   Alarms: none Events: 30-60 daily  Fixed speed 5600 Low speed limit: 5300   Primary Controller:  Replace back up battery in 26 months. Back up controller:   Replace back up battery in 27 months.   Annual Equipment Maintenance on UBC/PM was performed on 07/30/23.    I reviewed the LVAD parameters from today and compared the results to the patient's prior recorded data. LVAD interrogation was NEGATIVE for significant power changes, NEGATIVE for clinical alarms and STABLE for PI events/speed drops. No programming changes were made and pump is functioning within specified parameters. Pt is performing daily controller and system monitor self tests along with completing weekly and monthly maintenance for LVAD equipment.   LVAD equipment check completed and is in good working order. Back-up equipment present. Charged back up battery and performed self-test on equipment.   Past Medical History:  Diagnosis Date   Acid reflux    ADHD (attention deficit hyperactivity disorder)    Asthma    Back pain    Seizures (HCC)     Resolved    Current Outpatient Medications  Medication Sig Dispense Refill   acetaminophen (TYLENOL) 325 MG tablet Take 2 tablets (650 mg total) by mouth every 4 (four) hours as needed for headache or mild pain.     calcium carbonate (TUMS - DOSED IN MG ELEMENTAL CALCIUM) 500 MG chewable tablet Chew 1 tablet by mouth daily as needed for indigestion.     cyclobenzaprine (FLEXERIL) 5 MG tablet Take 1 tablet (5 mg total) by mouth 2 (two) times daily as needed for muscle spasms. 30 tablet 0   digoxin (LANOXIN) 0.125 MG tablet Take 1 tablet (0.125 mg total) by mouth daily. 30 tablet 6   gabapentin (NEURONTIN) 300 MG capsule Take 1 capsule (300 mg total) by mouth 3 (three) times daily. 90 capsule 6   ivabradine (CORLANOR) 5 MG TABS tablet Take 1 tablet (5 mg total) by mouth 2 (two) times daily with a meal. 60 tablet 11   losartan (COZAAR) 25 MG tablet Take 1 tablet (25 mg total) by mouth 2 (two) times daily. 60 tablet 6   mexiletine (MEXITIL) 200 MG capsule Take 1 capsule (200 mg total) by mouth 2 (two) times daily. 60 capsule 6   pantoprazole (PROTONIX) 40 MG tablet Take 1 tablet (40 mg total) by mouth daily. 30 tablet 6   sertraline (ZOLOFT) 25 MG tablet Take 1 tablet (25 mg total) by mouth daily. 30 tablet 6   SUBOXONE 8-2 MG FILM Place 1 Film  under the tongue daily.     traZODone (DESYREL) 50 MG tablet Take 1 tablet (50 mg total) by mouth at bedtime as needed for sleep. 30 tablet 6   warfarin (COUMADIN) 1 MG tablet Take 2 mg (2 tablets) daily or as directed by the HF Clinic 75 tablet 6   albuterol (VENTOLIN HFA) 108 (90 Base) MCG/ACT inhaler Inhale 2 puffs into the lungs every 6 (six) hours as needed for wheezing or shortness of breath. Shortness of breath 1 each 6   enoxaparin (LOVENOX) 40 MG/0.4ML injection Inject 0.4 mLs (40 mg total) into the skin every 12 (twelve) hours. (Patient not taking: Reported on 08/21/2023) 4 mL 0   fluticasone (FLONASE) 50 MCG/ACT nasal spray Place 1 spray into both  nostrils daily. (Patient not taking: Reported on 09/22/2023) 15.8 mL 0   lidocaine 4 % Place 2 patches onto the skin daily. Remove & Discard patch within 12 hours or as directed by MD (Patient not taking: Reported on 09/22/2023) 18 patch 0   ondansetron (ZOFRAN-ODT) 4 MG disintegrating tablet Take 1 tablet (4 mg total) by mouth every 8 (eight) hours as needed for nausea or vomiting. (Patient not taking: Reported on 09/22/2023) 20 tablet 0   traMADol (ULTRAM) 50 MG tablet Take 1-2 tablets (50-100 mg total) by mouth every 6 (six) hours as needed for moderate pain. (Patient not taking: Reported on 09/22/2023) 30 tablet 0   No current facility-administered medications for this encounter.      Vitals:   09/22/23 1131 09/22/23 1138  BP: (!) 96/0 122/88  Pulse: (!) 126   SpO2: 98%   Weight: 68.6 kg (151 lb 3.2 oz)      Vital Signs:  Doppler Pressure: 96               Automatc BP: 122/88 (95) HR: 126-131 ST on arrival. With rest- HR 116-121 SPO2: 98 % RA   Weight: 151.2 lb w/ eqt Last weight: 144.2 lb Home weights: 146-148 lbs     Physical Exam: General:  NAD.  HEENT: normal  Neck: supple. JVP not elevated.  Carotids 2+ bilat; no bruits. No lymphadenopathy or thryomegaly appreciated. Cor: LVAD hum.  Lungs: Clear. Abdomen: obese soft, nontender, non-distended. No hepatosplenomegaly. No bruits or masses. Good bowel sounds. Driveline site clean. Anchor in place.  Extremities: no cyanosis, clubbing, rash. Warm no edema  Neuro: alert & oriented x 3. No focal deficits. Moves all 4 without problem   ASSESSMENT AND PLAN:   1. Chronic biventricular systolic heart failure - Admitted 7/24 NYHA IV symptoms .Echo EF <20%,  RV mildly reduced, - Etiology uncertain. ? 2/2 PVCs vs genetically mediated +/- hypertension.  - LHC with normal coronaries.  - cMRI:  LV markedly dilated EF 10% RV 17% NICM - Underwent HM-III VAD on 07/28/25 - Much improved NYHA I-II  - Volume status looks good Suspect may be  a little dry. Encouraged po intake. - Continue digoxin 0.125 mg daily - Continue losartan 25 mg BID - HR still elevated in setting of BiV failure. Add ivabradine 5 bid  2. S/P HMIII LVAD  on 07/29/23 - VAD interrogated personally. Parameters stable. - MAPs ok - LDH 171 - Hgb 14.2 - INR 1.2 Goal 2.0-3.0 Discussed dosing with PharmD personally. Start lovenox - Can stop ASA - DL site not completely incorporated. Long discussion about strategies to protect DL and keep it immobilized. DL site care as per VAD RN note.    3. PVCs - Suppressed with  Mexiletine 200 mg BID - likely cn stop soon   4. Tobacco use - quit smoking in 7/24 but cotinine levels +   5. Hx drug abuse - Continue suboxone - no change  Total time spent 40 minutes. Over half that time spent discussing above.   [name], MD  12:40 PM

## 2023-09-29 ENCOUNTER — Encounter (HOSPITAL_COMMUNITY): Payer: Self-pay | Admitting: Cardiology

## 2023-09-29 ENCOUNTER — Ambulatory Visit (HOSPITAL_COMMUNITY)
Admission: RE | Admit: 2023-09-29 | Discharge: 2023-09-29 | Disposition: A | Discharge status: Home / Self Care | Payer: Medicaid Other | Source: Ambulatory Visit | Attending: Cardiology | Admitting: Cardiology

## 2023-09-29 ENCOUNTER — Ambulatory Visit (HOSPITAL_COMMUNITY): Admission: RE | Admit: 2023-09-29 | Payer: Medicaid Other | Source: Ambulatory Visit

## 2023-09-29 ENCOUNTER — Other Ambulatory Visit: Payer: Self-pay

## 2023-09-29 ENCOUNTER — Inpatient Hospital Stay (HOSPITAL_COMMUNITY)
Admission: AD | Admit: 2023-09-29 | Discharge: 2023-10-16 | DRG: 264 | Disposition: A | Discharge status: Home Health | Payer: Medicaid Other | Source: Ambulatory Visit | Attending: Cardiology | Admitting: Cardiology

## 2023-09-29 ENCOUNTER — Ambulatory Visit (HOSPITAL_COMMUNITY): Payer: Self-pay | Admitting: Pharmacist

## 2023-09-29 ENCOUNTER — Encounter (HOSPITAL_COMMUNITY): Payer: Self-pay

## 2023-09-29 VITALS — BP 86/0 | HR 86

## 2023-09-29 DIAGNOSIS — I493 Ventricular premature depolarization: Secondary | ICD-10-CM | POA: Diagnosis not present

## 2023-09-29 DIAGNOSIS — T827XXA Infection and inflammatory reaction due to other cardiac and vascular devices, implants and grafts, initial encounter: Principal | ICD-10-CM | POA: Diagnosis present

## 2023-09-29 DIAGNOSIS — T827XXD Infection and inflammatory reaction due to other cardiac and vascular devices, implants and grafts, subsequent encounter: Secondary | ICD-10-CM | POA: Diagnosis not present

## 2023-09-29 DIAGNOSIS — I4891 Unspecified atrial fibrillation: Secondary | ICD-10-CM | POA: Diagnosis not present

## 2023-09-29 DIAGNOSIS — F17210 Nicotine dependence, cigarettes, uncomplicated: Secondary | ICD-10-CM | POA: Diagnosis not present

## 2023-09-29 DIAGNOSIS — J45909 Unspecified asthma, uncomplicated: Secondary | ICD-10-CM | POA: Diagnosis present

## 2023-09-29 DIAGNOSIS — F17220 Nicotine dependence, chewing tobacco, uncomplicated: Secondary | ICD-10-CM | POA: Diagnosis not present

## 2023-09-29 DIAGNOSIS — I5022 Chronic systolic (congestive) heart failure: Secondary | ICD-10-CM | POA: Diagnosis present

## 2023-09-29 DIAGNOSIS — I509 Heart failure, unspecified: Secondary | ICD-10-CM | POA: Diagnosis not present

## 2023-09-29 DIAGNOSIS — Z95811 Presence of heart assist device: Secondary | ICD-10-CM | POA: Diagnosis not present

## 2023-09-29 DIAGNOSIS — I5082 Biventricular heart failure: Secondary | ICD-10-CM | POA: Diagnosis not present

## 2023-09-29 DIAGNOSIS — B9561 Methicillin susceptible Staphylococcus aureus infection as the cause of diseases classified elsewhere: Secondary | ICD-10-CM | POA: Diagnosis present

## 2023-09-29 DIAGNOSIS — K219 Gastro-esophageal reflux disease without esophagitis: Secondary | ICD-10-CM | POA: Diagnosis present

## 2023-09-29 DIAGNOSIS — I110 Hypertensive heart disease with heart failure: Secondary | ICD-10-CM | POA: Diagnosis not present

## 2023-09-29 DIAGNOSIS — X58XXXA Exposure to other specified factors, initial encounter: Secondary | ICD-10-CM | POA: Insufficient documentation

## 2023-09-29 DIAGNOSIS — Z7901 Long term (current) use of anticoagulants: Secondary | ICD-10-CM

## 2023-09-29 DIAGNOSIS — Z91018 Allergy to other foods: Secondary | ICD-10-CM

## 2023-09-29 DIAGNOSIS — L7622 Postprocedural hemorrhage and hematoma of skin and subcutaneous tissue following other procedure: Secondary | ICD-10-CM | POA: Diagnosis not present

## 2023-09-29 DIAGNOSIS — Z91010 Allergy to peanuts: Secondary | ICD-10-CM | POA: Diagnosis not present

## 2023-09-29 DIAGNOSIS — Z5986 Financial insecurity: Secondary | ICD-10-CM | POA: Diagnosis not present

## 2023-09-29 DIAGNOSIS — A4901 Methicillin susceptible Staphylococcus aureus infection, unspecified site: Principal | ICD-10-CM

## 2023-09-29 DIAGNOSIS — Z79899 Other long term (current) drug therapy: Secondary | ICD-10-CM

## 2023-09-29 DIAGNOSIS — Y831 Surgical operation with implant of artificial internal device as the cause of abnormal reaction of the patient, or of later complication, without mention of misadventure at the time of the procedure: Secondary | ICD-10-CM | POA: Diagnosis present

## 2023-09-29 DIAGNOSIS — Y848 Other medical procedures as the cause of abnormal reaction of the patient, or of later complication, without mention of misadventure at the time of the procedure: Secondary | ICD-10-CM | POA: Diagnosis not present

## 2023-09-29 DIAGNOSIS — Z8249 Family history of ischemic heart disease and other diseases of the circulatory system: Secondary | ICD-10-CM

## 2023-09-29 DIAGNOSIS — I428 Other cardiomyopathies: Secondary | ICD-10-CM | POA: Diagnosis present

## 2023-09-29 DIAGNOSIS — Z4801 Encounter for change or removal of surgical wound dressing: Secondary | ICD-10-CM | POA: Diagnosis not present

## 2023-09-29 LAB — CBC
HCT: 40.1 % (ref 39.0–52.0)
Hemoglobin: 13.4 g/dL (ref 13.0–17.0)
MCH: 28.7 pg (ref 26.0–34.0)
MCHC: 33.4 g/dL (ref 30.0–36.0)
MCV: 85.9 fL (ref 80.0–100.0)
Platelets: 248 10*3/uL (ref 150–400)
RBC: 4.67 MIL/uL (ref 4.22–5.81)
RDW: 14.5 % (ref 11.5–15.5)
WBC: 11 10*3/uL — ABNORMAL HIGH (ref 4.0–10.5)
nRBC: 0 % (ref 0.0–0.2)

## 2023-09-29 LAB — PROTIME-INR
INR: 2 — ABNORMAL HIGH (ref 0.8–1.2)
Prothrombin Time: 23.3 s — ABNORMAL HIGH (ref 11.4–15.2)

## 2023-09-29 LAB — BASIC METABOLIC PANEL
Anion gap: 11 (ref 5–15)
BUN: 10 mg/dL (ref 6–20)
CO2: 23 mmol/L (ref 22–32)
Calcium: 8.9 mg/dL (ref 8.9–10.3)
Chloride: 100 mmol/L (ref 98–111)
Creatinine, Ser: 0.83 mg/dL (ref 0.61–1.24)
GFR, Estimated: >60 mL/min (ref >60)
Glucose, Bld: 160 mg/dL — ABNORMAL HIGH (ref 70–99)
Potassium: 3.3 mmol/L — ABNORMAL LOW (ref 3.5–5.1)
Sodium: 134 mmol/L — ABNORMAL LOW (ref 135–145)

## 2023-09-29 LAB — LACTATE DEHYDROGENASE: LDH: 181 U/L (ref 98–192)

## 2023-09-29 MED ORDER — KETOROLAC TROMETHAMINE 15 MG/ML IJ SOLN
7.5000 mg | Freq: Once | INTRAMUSCULAR | Status: AC
  Administered 2023-09-29: 7.5 mg via INTRAVENOUS
  Filled 2023-09-29: qty 1

## 2023-09-29 MED ORDER — DAPTOMYCIN-SODIUM CHLORIDE 500-0.9 MG/50ML-% IV SOLN
8.0000 mg/kg | Freq: Every day | INTRAVENOUS | Status: DC
  Filled 2023-09-29: qty 50

## 2023-09-29 MED ORDER — POTASSIUM CHLORIDE CRYS ER 20 MEQ PO TBCR
40.0000 meq | EXTENDED_RELEASE_TABLET | ORAL | Status: AC
  Administered 2023-09-29 – 2023-09-30 (×2): 40 meq via ORAL
  Filled 2023-09-29 (×2): qty 2

## 2023-09-29 MED ORDER — PANTOPRAZOLE SODIUM 40 MG PO TBEC
40.0000 mg | DELAYED_RELEASE_TABLET | Freq: Every day | ORAL | Status: DC
  Administered 2023-09-30 – 2023-10-16 (×17): 40 mg via ORAL
  Filled 2023-09-29 (×17): qty 1

## 2023-09-29 MED ORDER — ACETAMINOPHEN 325 MG PO TABS
650.0000 mg | ORAL_TABLET | ORAL | Status: DC | PRN
  Administered 2023-09-30 – 2023-10-10 (×14): 650 mg via ORAL
  Filled 2023-09-29 (×14): qty 2

## 2023-09-29 MED ORDER — ALBUTEROL SULFATE HFA 108 (90 BASE) MCG/ACT IN AERS: 2.0000 | INHALATION_SPRAY | Freq: Four times a day (QID) | RESPIRATORY_TRACT | Status: DC | PRN

## 2023-09-29 MED ORDER — SODIUM CHLORIDE 0.9 % IV SOLN
2.0000 g | Freq: Once | INTRAVENOUS | Status: AC
  Administered 2023-09-29: 2 g via INTRAVENOUS
  Filled 2023-09-29 (×2): qty 12.5

## 2023-09-29 MED ORDER — DAPTOMYCIN-SODIUM CHLORIDE 500-0.9 MG/50ML-% IV SOLN
8.0000 mg/kg | Freq: Every day | INTRAVENOUS | Status: DC
  Administered 2023-09-30 – 2023-10-01 (×3): 500 mg via INTRAVENOUS
  Filled 2023-09-29 (×5): qty 50

## 2023-09-29 MED ORDER — DIGOXIN 125 MCG PO TABS
0.1250 mg | ORAL_TABLET | Freq: Every day | ORAL | Status: DC
  Administered 2023-09-30 – 2023-10-16 (×17): 0.125 mg via ORAL
  Filled 2023-09-29 (×17): qty 1

## 2023-09-29 MED ORDER — MEXILETINE HCL 200 MG PO CAPS
200.0000 mg | ORAL_CAPSULE | Freq: Two times a day (BID) | ORAL | Status: DC
  Administered 2023-09-29 – 2023-10-13 (×28): 200 mg via ORAL
  Filled 2023-09-29 (×29): qty 1

## 2023-09-29 MED ORDER — IVABRADINE HCL 5 MG PO TABS
5.0000 mg | ORAL_TABLET | Freq: Two times a day (BID) | ORAL | Status: DC
  Administered 2023-09-29 – 2023-10-16 (×34): 5 mg via ORAL
  Filled 2023-09-29 (×36): qty 1

## 2023-09-29 MED ORDER — LOSARTAN POTASSIUM 25 MG PO TABS
25.0000 mg | ORAL_TABLET | Freq: Two times a day (BID) | ORAL | Status: DC
  Administered 2023-09-30 – 2023-10-16 (×31): 25 mg via ORAL
  Filled 2023-09-29 (×32): qty 1

## 2023-09-29 MED ORDER — SODIUM CHLORIDE 0.9 % IV SOLN
1.0000 g | INTRAVENOUS | Status: DC
  Administered 2023-09-29: 1 g via INTRAVENOUS
  Filled 2023-09-29: qty 10

## 2023-09-29 MED ORDER — VANCOMYCIN HCL 1250 MG/250ML IV SOLN
1250.0000 mg | Freq: Once | INTRAVENOUS | Status: AC
  Administered 2023-09-29: 1250 mg via INTRAVENOUS
  Filled 2023-09-29 (×2): qty 250

## 2023-09-29 MED ORDER — SERTRALINE HCL 25 MG PO TABS
25.0000 mg | ORAL_TABLET | Freq: Every day | ORAL | Status: DC
  Administered 2023-09-30 – 2023-10-16 (×17): 25 mg via ORAL
  Filled 2023-09-29 (×17): qty 1

## 2023-09-29 MED ORDER — TRAZODONE HCL 50 MG PO TABS
50.0000 mg | ORAL_TABLET | Freq: Every evening | ORAL | Status: DC | PRN
  Administered 2023-09-29 – 2023-10-15 (×15): 50 mg via ORAL
  Filled 2023-09-29 (×15): qty 1

## 2023-09-29 NOTE — Addendum Note (Signed)
Encounter addended by: Flora Lipps, RN on: 09/29/2023 7:19 PM  Actions taken: Clinical Note Signed

## 2023-09-29 NOTE — Progress Notes (Signed)
PHARMACY - ANTICOAGULATION CONSULT NOTE  Pharmacy Consult for heparin once INR < 1.8 Indication:  LVAD, Coumadin on hold  Allergies  Allergen Reactions   Other Anaphylaxis    Tree Nuts   Peanuts [Peanut Oil] Anaphylaxis    Patient Measurements: Weight: 68.3 kg (150 lb 9.2 oz)   Vital Signs: Temp: 98.4 F (36.9 C) (10/07 1733) Temp Source: Oral (10/07 1733) BP: 103/75 (10/07 1733) Pulse Rate: 88 (10/07 1733)  Labs: Recent Labs    09/29/23 1300 09/29/23 1401  HGB  --  13.4  HCT  --  40.1  PLT  --  248  LABPROT 23.3*  --   INR 2.0*  --   CREATININE 0.83  --     Estimated Creatinine Clearance: 115 mL/min (by C-G formula based on SCr of 0.83 mg/dL).   Medical History: Past Medical History:  Diagnosis Date   Acid reflux    ADHD (attention deficit hyperactivity disorder)    Asthma    Back pain    Seizures (HCC)    Resolved    Medications:  Infusions:   DAPTOmycin      Assessment: 37 yo male with LVAD, admitted with driveline infection.  INR = 2 today, planning to hold Coumadin given plan for I&D in the OR.  Goal of Therapy:   Monitor platelets by anticoagulation protocol: Yes   Plan:  Will begin low, fixed dose heparin at 500 units/hr once INR falls to < 1.8 AM INR.  Reece Leader, Colon Flattery, BCCP Clinical Pharmacist  09/29/2023 7:49 PM   [facility] pharmacy phone numbers are listed on [email or web address]

## 2023-09-29 NOTE — Plan of Care (Signed)
  Problem: Education: Goal: Understanding of CV disease, CV risk reduction, and recovery process will improve Outcome: Progressing Goal: Individualized Educational Video(s) Outcome: Progressing   Problem: Activity: Goal: Ability to return to baseline activity level will improve Outcome: Progressing   Problem: Cardiovascular: Goal: Ability to achieve and maintain adequate cardiovascular perfusion will improve Outcome: Progressing Goal: Vascular access site(s) Level 0-1 will be maintained Outcome: Progressing   Problem: Health Behavior/Discharge Planning: Goal: Ability to safely manage health-related needs after discharge will improve Outcome: Progressing   Problem: Education: Goal: Patient will understand all VAD equipment and how it functions Outcome: Progressing Goal: Patient will be able to verbalize current INR target range and antiplatelet therapy for discharge home Outcome: Progressing   Problem: Cardiac: Goal: LVAD will function as expected and patient will experience no clinical alarms Outcome: Progressing   Problem: Education: Goal: Knowledge of General Education information will improve Description: Including pain rating scale, medication(s)/side effects and non-pharmacologic comfort measures Outcome: Progressing   Problem: Health Behavior/Discharge Planning: Goal: Ability to manage health-related needs will improve Outcome: Progressing   Problem: Clinical Measurements: Goal: Ability to maintain clinical measurements within normal limits will improve Outcome: Progressing Goal: Will remain free from infection Outcome: Progressing Goal: Diagnostic test results will improve Outcome: Progressing Goal: Respiratory complications will improve Outcome: Progressing Goal: Cardiovascular complication will be avoided Outcome: Progressing   Problem: Activity: Goal: Risk for activity intolerance will decrease Outcome: Progressing   Problem: Nutrition: Goal: Adequate  nutrition will be maintained Outcome: Progressing   Problem: Coping: Goal: Level of anxiety will decrease Outcome: Progressing   Problem: Elimination: Goal: Will not experience complications related to bowel motility Outcome: Progressing Goal: Will not experience complications related to urinary retention Outcome: Progressing   Problem: Pain Managment: Goal: General experience of comfort will improve Outcome: Progressing

## 2023-09-29 NOTE — Progress Notes (Signed)
Pharmacy Antibiotic Note  Joel York is a 37 y.o. 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 Daiva Eves from ID.  Plan: Daptomycin 8 mg/kg q 24 hrs. F/u cultures, OR plans and clinical course.  Weight: 68.3 kg (150 lb 9.2 oz)  Temp (24hrs), Avg:98.4 F (36.9 C), Min:98.4 F (36.9 C), Max:98.4 F (36.9 C)  Recent Labs  Lab 09/29/23 1300 09/29/23 1401  WBC  --  11.0*  CREATININE 0.83  --     Estimated Creatinine Clearance: 115 mL/min (by C-G formula based on SCr of 0.83 mg/dL).    Allergies  Allergen Reactions   Other Anaphylaxis    Tree Nuts   Peanuts [Peanut Oil] Anaphylaxis    Antimicrobials this admission:  Vanc x 1 10/7 Cefepime x 1 10/7 Daptomycin 10/7 >   Dose adjustments this admission:   Microbiology results:   Thank you for allowing pharmacy to be a part of this patient's care.  Reece Leader, Colon Flattery, BCCP Clinical Pharmacist  09/29/2023 7:52 PM   [facility] pharmacy phone numbers are listed on [email or web address]

## 2023-09-29 NOTE — Patient Instructions (Signed)
Admit 2C

## 2023-09-29 NOTE — H&P (Signed)
Advanced Heart Failure VAD History and Physical Note   PCP-Cardiologist: None   Reason for Admission: LVAD Driveline Infection  HPI:    Joel York is a 37 y.o. male with HTN, ADD, asthma, hx drug abuse (now on suboxone x5 yrs), and seizures. Admitted in 7/27 with acute systolic heart failure -> shock. EF 10%. 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    - LHC with normal coronaries. RHC 07/24: Low CO (Fick CO/CI 3.3/1.9) and PAPi 3.0 on milrinone 0.375 . Off NE - cMRI:  LV markedly dilated EF 10% RV 17% NICM   Was last seen in VAD clinic 9/30, overall well, but reported some itching and redness at DL site. No fevers or chills. On physical exam, DL site clean without drainage with anchors in place.  Admitted today from VAD clinic with redness and drainage around driveline site concerning for DL infection. Denies fever, chills or aches. WBC 11. Blood cultures, CT C/A/P, and labs pending.   LVAD INTERROGATION:  HeartMate II LVAD:  Flow 4.2 liters/min, speed 5600, power 4.4, PI 3.9.     Home Medications Prior to Admission medications   Medication Sig Start Date End Date Taking? Authorizing Provider  acetaminophen (TYLENOL) 325 MG tablet Take 2 tablets (650 mg total) by mouth every 4 (four) hours as needed for headache or mild pain. 08/08/23   Clegg, Amy D, NP  albuterol (VENTOLIN HFA) 108 (90 Base) MCG/ACT inhaler Inhale 2 puffs into the lungs every 6 (six) hours as needed for wheezing or shortness of breath. Shortness of breath 09/22/23   Bensimhon, Bevelyn Buckles, MD  calcium carbonate (TUMS - DOSED IN MG ELEMENTAL CALCIUM) 500 MG chewable tablet Chew 1 tablet by mouth daily as needed for indigestion.    [provider]  cyclobenzaprine (FLEXERIL) 5 MG tablet Take 1 tablet (5 mg total) by mouth 2 (two) times daily as needed for muscle spasms. 08/14/23   Lovett Sox, MD  digoxin (LANOXIN) 0.125 MG tablet Take 1 tablet (0.125 mg total) by mouth daily. 08/08/23   Clegg, Amy D, NP   enoxaparin (LOVENOX) 40 MG/0.4ML injection Inject 0.4 mLs (40 mg total) into the skin every 12 (twelve) hours. Patient not taking: Reported on 08/21/2023 08/14/23   Bensimhon, Bevelyn Buckles, MD  fluticasone [facility]) 50 MCG/ACT nasal spray Place 1 spray into both nostrils daily. Patient not taking: Reported on 09/22/2023 04/30/23   Radford Pax, NP  gabapentin (NEURONTIN) 300 MG capsule Take 1 capsule (300 mg total) by mouth 3 (three) times daily. 08/08/23   Clegg, Amy D, NP  ivabradine (CORLANOR) 5 MG TABS tablet Take 1 tablet (5 mg total) by mouth 2 (two) times daily with a meal. 09/22/23   Bensimhon, Bevelyn Buckles, MD  lidocaine 4 % Place 2 patches onto the skin daily. Remove & Discard patch within 12 hours or as directed by MD Patient not taking: Reported on 09/22/2023 08/08/23   Tonye Becket D, NP  losartan (COZAAR) 25 MG tablet Take 1 tablet (25 mg total) by mouth 2 (two) times daily. 08/08/23   Clegg, Amy D, NP  mexiletine (MEXITIL) 200 MG capsule Take 1 capsule (200 mg total) by mouth 2 (two) times daily. 08/08/23   Clegg, Amy D, NP  ondansetron (ZOFRAN-ODT) 4 MG disintegrating tablet Take 1 tablet (4 mg total) by mouth every 8 (eight) hours as needed for nausea or vomiting. Patient not taking: Reported on 09/22/2023 02/24/23   Minna Antis, MD  pantoprazole (PROTONIX) 40 MG tablet Take 1  tablet (40 mg total) by mouth daily. 08/08/23   Clegg, Amy D, NP  sertraline (ZOLOFT) 25 MG tablet Take 1 tablet (25 mg total) by mouth daily. 08/08/23   Clegg, Amy D, NP  SUBOXONE 8-2 MG FILM Place 1 Film under the tongue daily. 07/07/23   [provider]  traMADol (ULTRAM) 50 MG tablet Take 1-2 tablets (50-100 mg total) by mouth every 6 (six) hours as needed for moderate pain. Patient not taking: Reported on 09/22/2023 08/08/23   Tonye Becket D, NP  traZODone (DESYREL) 50 MG tablet Take 1 tablet (50 mg total) by mouth at bedtime as needed for sleep. 08/08/23   Tonye Becket D, NP  warfarin (COUMADIN) 1 MG tablet Take 2 mg  (2 tablets) daily or as directed by the HF Clinic 08/21/23   Bensimhon, Bevelyn Buckles, MD    Past Medical History: Past Medical History:  Diagnosis Date   Acid reflux    ADHD (attention deficit hyperactivity disorder)    Asthma    Back pain    Seizures (HCC)    Resolved    Past Surgical History: Past Surgical History:  Procedure Laterality Date   CHOLECYSTECTOMY     INSERTION OF IMPLANTABLE LEFT VENTRICULAR ASSIST DEVICE N/A 07/30/2023   Procedure: INSERTION OF IMPLANTABLE LEFT VENTRICULAR ASSIST DEVICE;  Surgeon: Lovett Sox, MD;  Location: [facility] OR;  Service: Open Heart Surgery;  Laterality: N/A;   IR THORACENTESIS ASP PLEURAL SPACE W/IMG GUIDE  08/08/2023   RIGHT HEART CATH N/A 07/28/2023   Procedure: RIGHT HEART CATH;  Surgeon: Dolores Patty, MD;  Location: [facility] INVASIVE CV LAB;  Service: Cardiovascular;  Laterality: N/A;   RIGHT/LEFT HEART CATH AND CORONARY ANGIOGRAPHY N/A 07/16/2023   Procedure: RIGHT/LEFT HEART CATH AND CORONARY ANGIOGRAPHY;  Surgeon: Dolores Patty, MD;  Location: [facility] INVASIVE CV LAB;  Service: Cardiovascular;  Laterality: N/A;   TEE WITHOUT CARDIOVERSION N/A 07/30/2023   Procedure: TRANSESOPHAGEAL ECHOCARDIOGRAM;  Surgeon: Lovett Sox, MD;  Location: [facility] OR;  Service: Open Heart Surgery;  Laterality: N/A;   TOOTH EXTRACTION N/A 07/23/2023   Procedure: DENTAL RESTORATION/EXTRACTIONS;  Surgeon: Ocie Doyne, DMD;  Location: [facility] OR;  Service: Oral Surgery;  Laterality: N/A;   WISDOM TOOTH EXTRACTION      Family History: Family History  Problem Relation Age of Onset   Crohn's disease Maternal Grandmother    Crohn's disease Maternal Uncle    Hypertension Mother        Living   Congestive Heart Failure Maternal Grandmother    Hypertension Other        Maternal Aunts & Uncles    Social History: Social History   Socioeconomic History   Marital status: Married    Spouse name: Not on file   Number of children: Not on file   Years of education: Not on  file   Highest education level: Not on file  Occupational History   Not on file  Tobacco Use   Smoking status: Every Day    Types: Cigarettes   Smokeless tobacco: Current    Types: Chew  Substance and Sexual Activity   Alcohol use: Not Currently   Drug use: Not Currently    Comment: last use "many years ago"   Sexual activity: Yes  Other Topics Concern   Not on file  Social History Narrative   Not on file   Social Determinants of Health   Financial Resource Strain: Medium Risk (08/12/2023)   Overall Financial Resource Strain (CARDIA)  Difficulty of Paying Living Expenses: Somewhat hard  Food Insecurity: No Food Insecurity (08/12/2023)   Hunger Vital Sign    Worried About Running Out of Food in the Last Year: Never true    Ran Out of Food in the Last Year: Never true  Transportation Needs: No Transportation Needs (08/12/2023)   PRAPARE - Administrator, Civil Service (Medical): No    Lack of Transportation (Non-Medical): No  Physical Activity: Sufficiently Active (07/14/2023)   Exercise Vital Sign    Days of Exercise per Week: 3 days    Minutes of Exercise per Session: 60 min  Stress: Stress Concern Present (07/14/2023)   [employer] of Occupational Health - Occupational Stress Questionnaire    Feeling of Stress : To some extent  Social Connections: Not on file    Allergies:  Allergies  Allergen Reactions   Other Anaphylaxis    Tree Nuts   Peanuts [Peanut Oil] Anaphylaxis    Objective:    Vital Signs:       There were no vitals filed for this visit.  Mean arterial Pressure 68  Physical Exam    General:  Well appearing. No resp difficulty HEENT: Normal Neck: supple. JVP . Carotids 2+ bilat; no bruits. No lymphadenopathy or thyromegaly appreciated. Cor: Mechanical heart sounds with LVAD hum present. Lungs: Clear Abdomen: soft, nontender, nondistended. No hepatosplenomegaly. No bruits or masses. Good bowel sounds. Driveline: C/D/I;  securement device intact and driveline incorporated Extremities: no cyanosis, clubbing, rash, edema Neuro: alert & orientedx3, cranial nerves grossly intact. moves all 4 extremities w/o difficulty. Affect pleasant   Telemetry   SR in 80s  EKG   EKG pending  Labs    Basic Metabolic Panel: No results for input(s): "NA", "K", "CL", "CO2", "GLUCOSE", "BUN", "CREATININE", "CALCIUM", "MG", "PHOS" in the last 168 hours.  Liver Function Tests: No results for input(s): "AST", "ALT", "ALKPHOS", "BILITOT", "PROT", "ALBUMIN" in the last 168 hours. No results for input(s): "LIPASE", "AMYLASE" in the last 168 hours. No results for input(s): "AMMONIA" in the last 168 hours.  CBC: No results for input(s): "WBC", "NEUTROABS", "HGB", "HCT", "MCV", "PLT" in the last 168 hours.  Cardiac Enzymes: No results for input(s): "CKTOTAL", "CKMB", "CKMBINDEX", "TROPONINI" in the last 168 hours.  BNP: BNP (last 3 results) Recent Labs    07/14/23 0101 07/31/23 0428 08/05/23 2320  BNP 1,810.8*  2,494.6* 487.2* 483.2*    ProBNP (last 3 results) No results for input(s): "PROBNP" in the last 8760 hours.   CBG: No results for input(s): "GLUCAP" in the last 168 hours.  Coagulation Studies: No results for input(s): "LABPROT", "INR" in the last 72 hours.   Imaging    No results found.  Patient Profile:   Cheng Dec is a 37 yo male with PMH of 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 Now presenting with driveline infection  Assessment/Plan:    1. 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 ? 2/2 PVCs vs genetically mediated +/- hypertension.  - LHC: normal coronaries.  - cMRI:  LV markedly dilated EF 10% RV 17% NICM - Underwent HM-III VAD on 07/28/25. Much improved NYHA I-II  - INR pending. Goal 2.0-3.0 Dosing  per PharmD. - Euvolemic on exam - Continue digoxin 0.125 mg daily - Continue losartan 25 mg BID - Continue Ivabradine 5 BID  2. Acute Driveline Infection - Prior clinic visit 9/30 DL site was noted by VAD RN to be not completely incorporated. Long discussion about strategies to protect DL and keep it immobilized. Reported mild itching and redness at the time. - Now admitted with concern for driveline infection given redness and new drainage, denies fever or chills. WBC 11 today. - CT chest/abd/pelvis pending; CT Sx consulted - Driveline cultures and blood cultures obtained and dressing changed per VAD RN - Consult Infectious disease; empiric therapy with daptomycin and ceftriaxone  3. PVCs - Suppressed with Mexiletine 200 mg BID - likely can stop soon 4. Tobacco use - reports quit smoking in 7/24 5. Hx drug abuse - Continue Suboxone  I reviewed the LVAD parameters from today, and compared the results to the patient's prior recorded data.  No programming changes were made.  The LVAD is functioning within specified parameters.  The patient performs LVAD self-test daily.  LVAD interrogation was negative for any significant power changes, alarms or PI events/speed drops.  LVAD equipment check completed and is in good working order.  Back-up equipment present.   LVAD education done on emergency procedures and precautions and reviewed exit site care.  Length of Stay: 0  [place] Alicha Raspberry, NP 09/29/2023, 2:01 PM  VAD Team Pager [phone] (7am - 7am) +++VAD ISSUES ONLY+++  Advanced Heart Failure Team Pager [phone] (M-F; 7a - 5p)  Please contact [facility] for night-coverage after hours (5p -7a ) and weekends on [email or web address] for all non- LVAD Issues

## 2023-09-29 NOTE — Progress Notes (Addendum)
Pt presents for dressing change alone in VAD Clinic. Reports no problems with VAD equipment or concerns with drive line.  Pt tells me that he has been doing well but has noticed bloating in his abdomen over the past 2 days. Denies lightheadedness, dizziness, falls, shortness of breath, and signs of bleeding.  Pts wife Fatima Blank has been doing his driveline dressing change 3 times a week. Last week he reported significant itching and burning with CHG use. Rash/skin irritation noted under previous anchor and tape sites. Advised to no longer use CHG swabs and silver strips and cleansed with VASHE solution with 2 VASHE moistened 2 x 2 placed directly at exit site and advised to change twice a week. Wife Penni Bombard updated on changes made to dressing at time of visit.  Upon removing dressing at today's visit streaking noted from drive line exit site tracking up mid adbomen with inflamed areas at counter incision and chest tube sites. Gross amounts tan/green drainage noted which pt states is new compared to last dressing change. See dressing change details below. Dr. Elwyn Lade and Dr. Maren Beach notified decision made for admission for IV antibiotics and wound debridement scheduled tentatively for Wednesday afternoon. Blood cultures and wound cultures obtained in VAD Clinic.   Right 20G IV started and Cefepime and Vancomycin administered in VAD Clinic. CT chest abdomen pelvis to be obtained upon admission.   Vital Signs:  Doppler Pressure: 86   Automatc BP: 85/58(68) HR: 88 NSR SPO2: 97 % RA   VAD Indication: Destination Therapy due to recent smoking    VAD interrogation & Equipment Management: Speed: 5600 Flow: 4.2 Power: 4.4 w    PI: 3.9   Alarms: none Events: 30-60 daily  Fixed speed 5600 Low speed limit: 5300   Primary Controller:  Replace back up battery in 25 months. Back up controller:   Replace back up battery in 26 months.   Annual Equipment Maintenance on UBC/PM was performed on 07/30/23.     I reviewed the LVAD parameters from today and compared the results to the patient's prior recorded data. LVAD interrogation was NEGATIVE for significant power changes, NEGATIVE for clinical alarms and STABLE for PI events/speed drops. No programming changes were made and pump is functioning within specified parameters. Pt is performing daily controller and system monitor self tests along with completing weekly and monthly maintenance for LVAD equipment.   LVAD equipment check completed and is in good working order. Back-up equipment present. Charged back up battery and performed self-test on equipment.    Exit Site Care: Existing VAD dressing removed and site care performed using sterile technique. Drive line exit site cleansed with Chlora prep applications x 2 allowed to dry and packed with VASHE soaked Nu gauzed into wound bed. 2 VASHE moistened 2 x 2s placed directly at exit site. Wound tunnels approximately 4cm. Aerobic culture obtained. Covered with large tegaderm instead of tape. Drive line exit site unincorporated. The velour is fully implanted at exit site. Gross amount of tan/green drainage noted on previous dressing. Slight erythema at exit site. Rash again noted under previous tape/anchor sites with slight improvement from last week. Anchor secured. Pt denies fever or chills. Will admit to [facility] for drive line infection.     Significant Events on VAD Support:    Device: n/a    BP & Labs:  MAP 86 - Doppler is reflecting MAP   Hgb 13.4 - No S/S of bleeding. Specifically denies melena/BRBPR or nosebleeds.   LDH stable at 181 with  established baseline of 200-348. Denies tea-colored urine. No power elevations noted on interrogation.    Plan: Admit to 2C  Simmie Davies RN,BSN VAD Coordinator  Office: [phone]  24/7 Pager: [phone]

## 2023-09-30 ENCOUNTER — Inpatient Hospital Stay (HOSPITAL_COMMUNITY): Payer: Medicaid Other

## 2023-09-30 ENCOUNTER — Encounter (HOSPITAL_COMMUNITY): Payer: Self-pay | Admitting: Cardiology

## 2023-09-30 DIAGNOSIS — A4901 Methicillin susceptible Staphylococcus aureus infection, unspecified site: Secondary | ICD-10-CM | POA: Diagnosis not present

## 2023-09-30 DIAGNOSIS — T827XXA Infection and inflammatory reaction due to other cardiac and vascular devices, implants and grafts, initial encounter: Secondary | ICD-10-CM | POA: Diagnosis not present

## 2023-09-30 DIAGNOSIS — R918 Other nonspecific abnormal finding of lung field: Secondary | ICD-10-CM | POA: Diagnosis not present

## 2023-09-30 DIAGNOSIS — Z95811 Presence of heart assist device: Secondary | ICD-10-CM | POA: Diagnosis not present

## 2023-09-30 DIAGNOSIS — I517 Cardiomegaly: Secondary | ICD-10-CM | POA: Diagnosis not present

## 2023-09-30 DIAGNOSIS — Z9049 Acquired absence of other specified parts of digestive tract: Secondary | ICD-10-CM | POA: Diagnosis not present

## 2023-09-30 DIAGNOSIS — R161 Splenomegaly, not elsewhere classified: Secondary | ICD-10-CM | POA: Diagnosis not present

## 2023-09-30 LAB — PROTIME-INR
INR: 2.2 — ABNORMAL HIGH (ref 0.8–1.2)
INR: 2.3 — ABNORMAL HIGH (ref 0.8–1.2)
Prothrombin Time: 24.6 s — ABNORMAL HIGH (ref 11.4–15.2)
Prothrombin Time: 25.2 s — ABNORMAL HIGH (ref 11.4–15.2)

## 2023-09-30 LAB — CBC
HCT: 42.8 % (ref 39.0–52.0)
HCT: 43.1 % (ref 39.0–52.0)
Hemoglobin: 13.7 g/dL (ref 13.0–17.0)
Hemoglobin: 13.9 g/dL (ref 13.0–17.0)
MCH: 28.2 pg (ref 26.0–34.0)
MCH: 27.6 pg (ref 26.0–34.0)
MCHC: 32 g/dL (ref 30.0–36.0)
MCHC: 32.3 g/dL (ref 30.0–36.0)
MCV: 88.2 fL (ref 80.0–100.0)
MCV: 85.7 fL (ref 80.0–100.0)
Platelets: 235 10*3/uL (ref 150–400)
Platelets: 270 10*3/uL (ref 150–400)
RBC: 4.85 MIL/uL (ref 4.22–5.81)
RBC: 5.03 MIL/uL (ref 4.22–5.81)
RDW: 14.4 % (ref 11.5–15.5)
RDW: 14.4 % (ref 11.5–15.5)
WBC: 8.6 10*3/uL (ref 4.0–10.5)
WBC: 9.7 10*3/uL (ref 4.0–10.5)
nRBC: 0 % (ref 0.0–0.2)
nRBC: 0 % (ref 0.0–0.2)

## 2023-09-30 LAB — COMPREHENSIVE METABOLIC PANEL
ALT: 13 U/L (ref 0–44)
AST: 18 U/L (ref 15–41)
Albumin: 3.3 g/dL — ABNORMAL LOW (ref 3.5–5.0)
Alkaline Phosphatase: 132 U/L — ABNORMAL HIGH (ref 38–126)
Anion gap: 14 (ref 5–15)
BUN: 9 mg/dL (ref 6–20)
CO2: 27 mmol/L (ref 22–32)
Calcium: 9.2 mg/dL (ref 8.9–10.3)
Chloride: 98 mmol/L (ref 98–111)
Creatinine, Ser: 0.93 mg/dL (ref 0.61–1.24)
GFR, Estimated: >60 mL/min (ref >60)
Glucose, Bld: 85 mg/dL (ref 70–99)
Potassium: 4.5 mmol/L (ref 3.5–5.1)
Sodium: 139 mmol/L (ref 135–145)
Total Bilirubin: 0.3 mg/dL (ref 0.3–1.2)
Total Protein: 6.8 g/dL (ref 6.5–8.1)

## 2023-09-30 LAB — MRSA NEXT GEN BY PCR, NASAL: MRSA by PCR Next Gen: NOT DETECTED

## 2023-09-30 LAB — BASIC METABOLIC PANEL
Anion gap: 9 (ref 5–15)
BUN: 10 mg/dL (ref 6–20)
CO2: 31 mmol/L (ref 22–32)
Calcium: 8.9 mg/dL (ref 8.9–10.3)
Chloride: 98 mmol/L (ref 98–111)
Creatinine, Ser: 0.91 mg/dL (ref 0.61–1.24)
GFR, Estimated: >60 mL/min (ref >60)
Glucose, Bld: 97 mg/dL (ref 70–99)
Potassium: 3.8 mmol/L (ref 3.5–5.1)
Sodium: 138 mmol/L (ref 135–145)

## 2023-09-30 LAB — MAGNESIUM
Magnesium: 1.8 mg/dL (ref 1.7–2.4)
Magnesium: 1.8 mg/dL (ref 1.7–2.4)

## 2023-09-30 LAB — LACTATE DEHYDROGENASE
LDH: 129 U/L (ref 98–192)
LDH: 175 U/L (ref 98–192)

## 2023-09-30 LAB — ECHOCARDIOGRAM LIMITED
Est EF: <20
Weight: 2292.78 [oz_av]

## 2023-09-30 LAB — PREPARE RBC (CROSSMATCH)

## 2023-09-30 LAB — DIGOXIN LEVEL: Digoxin Level: 0.4 ng/mL — ABNORMAL LOW (ref 0.8–2.0)

## 2023-09-30 MED ORDER — MORPHINE SULFATE (PF) 2 MG/ML IV SOLN: 4.0000 mg | INTRAVENOUS | Status: DC | PRN

## 2023-09-30 MED ORDER — OXYCODONE HCL 5 MG PO TABS
5.0000 mg | ORAL_TABLET | Freq: Four times a day (QID) | ORAL | Status: DC | PRN
  Administered 2023-09-30 (×2): 10 mg via ORAL
  Administered 2023-09-30 – 2023-10-01 (×3): 5 mg via ORAL
  Filled 2023-09-30 (×2): qty 1
  Filled 2023-09-30: qty 2
  Filled 2023-09-30: qty 1
  Filled 2023-09-30: qty 2

## 2023-09-30 MED ORDER — SODIUM CHLORIDE 0.9% IV SOLUTION: Freq: Once | INTRAVENOUS | Status: DC

## 2023-09-30 MED ORDER — MORPHINE SULFATE (PF) 2 MG/ML IV SOLN
4.0000 mg | INTRAVENOUS | Status: DC | PRN
  Administered 2023-09-30: 4 mg via INTRAVENOUS
  Filled 2023-09-30: qty 2

## 2023-09-30 MED ORDER — SODIUM CHLORIDE 0.9 % IV SOLN: INTRAVENOUS | Status: DC | PRN

## 2023-09-30 MED ORDER — OXYCODONE HCL 5 MG PO TABS: 5.0000 mg | ORAL_TABLET | Freq: Four times a day (QID) | ORAL | Status: DC | PRN

## 2023-09-30 MED ORDER — SODIUM CHLORIDE 0.45 % IV SOLN: INTRAVENOUS | Status: DC

## 2023-09-30 MED ORDER — MORPHINE SULFATE (PF) 2 MG/ML IV SOLN
4.0000 mg | Freq: Once | INTRAVENOUS | Status: AC
  Administered 2023-09-30: 4 mg via INTRAVENOUS
  Filled 2023-09-30: qty 2

## 2023-09-30 MED ORDER — IOHEXOL 350 MG/ML SOLN
75.0000 mL | Freq: Once | INTRAVENOUS | Status: AC | PRN
  Administered 2023-09-30: 75 mL via INTRAVENOUS

## 2023-09-30 MED ORDER — CEFAZOLIN SODIUM-DEXTROSE 2-4 GM/100ML-% IV SOLN: 2.0000 g | INTRAVENOUS | Status: DC

## 2023-09-30 NOTE — Progress Notes (Signed)
PHARMACY - ANTICOAGULATION CONSULT NOTE  Pharmacy Consult for heparin once INR < 1.8 Indication:  LVAD, Coumadin on hold  Allergies  Allergen Reactions   Other Anaphylaxis    Tree Nuts   Peanuts [Peanut Oil] Anaphylaxis    Patient Measurements: Weight: 65 kg (143 lb 4.8 oz)   Vital Signs: Temp: 98.3 F (36.8 C) (10/08 1038) Temp Source: Oral (10/08 1038) BP: 111/91 (10/08 1038) Pulse Rate: 90 (10/08 1038)  Labs: Recent Labs    09/29/23 1300 09/29/23 1401 09/30/23 0221  HGB  --  13.4 13.7  HCT  --  40.1 42.8  PLT  --  248 235  LABPROT 23.3*  --  24.6*  INR 2.0*  --  2.2*  CREATININE 0.83  --  0.91    Estimated Creatinine Clearance: 103.2 mL/min (by C-G formula based on SCr of 0.91 mg/dL).   Medical History: Past Medical History:  Diagnosis Date   Acid reflux    ADHD (attention deficit hyperactivity disorder)    Asthma    Back pain    Seizures (HCC)    Resolved    Medications:  Infusions:   sodium chloride     DAPTOmycin Stopped (09/30/23 0441)    Assessment: 37 yo male with LVAD, admitted with driveline infection.  INR = 2.2 today - holding warfarin with plans for I&D in the OR 10/9. H/h pltc, ldh stable - oozing from power cord resolved with sliver nitrate.  Goal of Therapy:   Monitor platelets by anticoagulation protocol: Yes   Plan:  Will begin low, fixed dose heparin at 500 units/hr once INR falls to < 1.8 Daily protime and cbc    Leota Sauers Pharm.D. CPP, BCPS Clinical Pharmacist [phone] 09/30/2023 11:08 AM    [facility] pharmacy phone numbers are listed on [email or web address]

## 2023-09-30 NOTE — Progress Notes (Addendum)
VAD Coordinator paged regarding acute bleeding at drive line site following CT scan. Pressure held at site by bedside RN and pressure dressing applied. This VAD Coordinator reported to bedside to assess. Dressing taken down and active bleeding seen at exit site. Pt and bedside nurse deny trauma. Manual pressure held at drive line exit site for approximately 30 minutes by Dr. Maren Beach. Drive line exit site cleansed with Chlora prep applications x 1, silver nitrate x 3 used in exit site and then cleaned w/VASHE 2 x 2. 2 VASHE moistened 2 x 2s placed directly at exit site followed by sterile 4 x 4 and the secured with tegaderm. Hemostasis achieved. 2 FFP ordered and per Dr. Maren Beach and initiated on 2C. VAD Coordinator assisted with transported to 2H06.      Simmie Davies RN, BSN VAD Coordinator 24/7 Pager [phone]

## 2023-09-30 NOTE — Progress Notes (Addendum)
Advanced Heart Failure VAD Team Note  PCP-Cardiologist: None   Subjective:   09/29/23: admitted with DL infection. CT A/P pending  Complaining at pain around site this morning.    LVAD INTERROGATION:  HeartMate II LVAD:   Flow 4.1 liters/min, speed 5600, power 4.4, PI 4.5.  x2 PI events this morning  Objective:    Vital Signs:   Temp:  [97.6 F (36.4 C)-98.4 F (36.9 C)] 98.2 F (36.8 C) (10/08 0300) Pulse Rate:  [83-88] 88 (10/08 0300) Resp:  [13-16] 14 (10/08 0406) BP: (90-103)/(61-75) 90/74 (10/08 0406) SpO2:  [95 %-98 %] 97 % (10/08 0300) Weight:  [65 kg-68.3 kg] 65 kg (10/08 0406) Last BM Date : 09/28/23 Mean arterial Pressure 80s  Intake/Output:   Intake/Output Summary (Last 24 hours) at 09/30/2023 0746 Last data filed at 09/30/2023 3664 Gross per 24 hour  Intake 50.08 ml  Output --  Net 50.08 ml     Physical Exam  General:  Pained appearing. No resp difficulty HEENT: Normal Neck: supple. JVP ~7. Carotids 2+ bilat; no bruits. No lymphadenopathy or thyromegaly appreciated. Cor: Mechanical heart sounds with LVAD hum present. Lungs: Clear Abdomen: soft, tender, nondistended. No hepatosplenomegaly. No bruits or masses. Good bowel sounds. Driveline: C/D/I; securement device intact. Dressing around DL site. With purulent drainage. Rash around DL exit site.   Extremities: no cyanosis, clubbing, rash, edema Neuro: alert & orientedx3, cranial nerves grossly intact. moves all 4 extremities w/o difficulty. Affect pleasant   Telemetry   NSR 90s (Personally reviewed)    EKG    No new EKG to review  Labs   Basic Metabolic Panel: Recent Labs  Lab 09/29/23 1300 09/30/23 0221  NA 134* 138  K 3.3* 3.8  CL 100 98  CO2 23 31  GLUCOSE 160* 97  BUN 10 10  CREATININE 0.83 0.91  CALCIUM 8.9 8.9  MG  --  1.8    Liver Function Tests: No results for input(s): "AST", "ALT", "ALKPHOS", "BILITOT", "PROT", "ALBUMIN" in the last 168 hours. No results for input(s):  "LIPASE", "AMYLASE" in the last 168 hours. No results for input(s): "AMMONIA" in the last 168 hours.  CBC: Recent Labs  Lab 09/29/23 1401 09/30/23 0221  WBC 11.0* 8.6  HGB 13.4 13.7  HCT 40.1 42.8  MCV 85.9 88.2  PLT 248 235    INR: Recent Labs  Lab 09/29/23 1300 09/30/23 0221  INR 2.0* 2.2*    Other results: EKG:    Imaging   No results found.   Medications:     Scheduled Medications:  digoxin  0.125 mg Oral Daily   ivabradine  5 mg Oral BID WC   losartan  25 mg Oral BID   mexiletine  200 mg Oral BID   pantoprazole  40 mg Oral Daily   sertraline  25 mg Oral Daily    Infusions:  DAPTOmycin Stopped (09/30/23 0441)    PRN Medications: acetaminophen, albuterol, traZODone   Patient Profile     Assessment/Plan:   1. Acute Driveline Infection - Prior clinic visit 9/30 DL site was noted by VAD RN to be not completely incorporated. Long discussion about strategies to protect DL and keep it immobilized. Reported mild itching and redness at the time. - Now admitted with concern for driveline infection given redness and new drainage, denies fever or chills. WBC 11>8.6today. - CT chest/abd/pelvis pending; CT Sx consulted, plan for debridement Wednesday - Driveline cultures and blood cultures obtained and dressing changed per VAD RN - Infectious  disease consulted; Started on daptomycin  - Oxy added for pain today  2. 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 ? 2/2 PVCs vs genetically mediated +/- hypertension.  - LHC: normal coronaries.  - cMRI:  LV markedly dilated EF 10% RV 17% NICM - Underwent HM-III VAD on 07/28/25. Much improved NYHA I-II  - INR 2.2. Goal 2.0-3.0 Coumadin dosing per PharmD, currently on hold. Start heparin when INR down to 1.8 - Euvolemic on exam - Continue digoxin 0.125 mg daily - Continue losartan 25 mg BID - Continue  Ivabradine 5 BID - Strict I&O, daily weights - Imperative that he stops smoking so he may be considered for Transplant  3. PVCs - Suppressed with Mexiletine 200 mg BID - likely can stop soon  4. Tobacco use - reports quit smoking 7/24 - Cotinine level at 30.1 09/22/23  5. Hx drug abuse - Continue Suboxone  I reviewed the LVAD parameters from today, and compared the results to the patient's prior recorded data.  No programming changes were made.  The LVAD is functioning within specified parameters.  The patient performs LVAD self-test daily.  LVAD interrogation was negative for any significant power changes, alarms or PI events/speed drops.  LVAD equipment check completed and is in good working order.  Back-up equipment present.   LVAD education done on emergency procedures and precautions and reviewed exit site care.  Length of Stay: 1  Alen Bleacher, NP 09/30/2023, 7:46 AM  VAD Team --- VAD ISSUES ONLY--- Pager [phone] (7am - 7am)  Advanced Heart Failure Team  Pager [phone] (M-F; 7a - 5p)  Please contact [facility] for night-coverage after hours (5p -7a ) and weekends on [email or web address]  Patient seen with NP, agree with the above note.   Patient had CT abdomen today with fluid collection around driveline.  He had some bleeding from driveline site after the CT, now has stopped.  Hgb stable todauy, INR 2.3.    MAP 88  On daptomycin for Staph aureus from driveline site.   General: Well appearing this am. NAD.  HEENT: Normal. Neck: Supple, JVP 7-8 cm. Carotids OK.  Cardiac:  Mechanical heart sounds with LVAD hum present.  Lungs:  CTAB, normal effort.  Abdomen:  NT, ND, no HSM. No bruits or masses. +BS  LVAD exit site:Site dressed Extremities:  Warm and dry. No cyanosis, clubbing, rash, or edema.  Neuro:  Alert & oriented x 3. Cranial nerves grossly intact. Moves all 4 extremities w/o difficulty. Affect pleasant    Driveline site abscess suspected by CT, S aureus on wound  culture.   - Continue daptomycin - To OR for debridement tomorrow. - Off warfarin, can use low dose heparin gtt when INR < 1.8.  Bleeding from driveline site has stopped.   Marca Ancona 09/30/2023 12:39 PM

## 2023-09-30 NOTE — Consult Note (Signed)
[address]            [place] [address]          [phone]       Joel York [facility] Medical Record #[id number] Date of Birth: 02/02/1986  Laurey Morale, MD Pcp, No  Chief Complaint:   No chief complaint on file.  Patient examined, images of CT of chest and abdomen personally reviewed and discussed with patient.  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  The patient had been followed at regular intervals with minimal drainage from the exit site and no symptoms of infection.  Yesterday he had significant purulent drainage with a tunnel space along with power cord extending approximately 8 cm.  He was nontoxic, afebrile with normal white count.  Cultures were taken of the wound and blood.  Wound cultures show gram-positive cocci consistent with M SSA.  He was started on broad-spectrum IV antibiotics.  On exam the patient has tenderness of his abdominal wall from the exit site of the power cord to the counterincision close to the midline.  One of the lower midline chest tube sites also has probable involvement.  The sternal incision and substernal area appeared to be normal.  CT scan images showing indurated tissue and space around the power cord extending towards the counterincision in the abdominal wall consistent with VAD tunnel infection.  The sternal incision itself appears to be healing well.  This morning after the CT scan and transport back to his unit the patient developed abdominal pain and bleeding from the power cord exit site.  INR today is 2.2 with normal platelet count and hematocrit 42.8%. Pressure was held at the exit site and chemical cauterization of the skin and subcutaneous tissue with silver nitrate was performed.  Patient had transient drop in blood pressure to systolic  pressure of 74.  VAD parameters including flow and pulsativity  index remained satisfactory.  The patient is being transferred to the ICU and will be given FFP and carefully observe for further bleeding.  Current Activity/ Functional Status: Patient had good functional level before readmission.   Past Medical History:  Diagnosis Date   Acid reflux    ADHD (attention deficit hyperactivity disorder)    Asthma    Back pain    Seizures (HCC)    Resolved    Past Surgical History:  Procedure Laterality Date   CHOLECYSTECTOMY     INSERTION OF IMPLANTABLE LEFT VENTRICULAR ASSIST DEVICE N/A 07/30/2023   Procedure: INSERTION OF IMPLANTABLE LEFT VENTRICULAR ASSIST DEVICE;  Surgeon: Lovett Sox, MD;  Location: [facility] OR;  Service: Open Heart Surgery;  Laterality: N/A;   IR THORACENTESIS ASP PLEURAL SPACE W/IMG GUIDE  08/08/2023   RIGHT HEART CATH N/A 07/28/2023   Procedure: RIGHT HEART CATH;  Surgeon: Dolores Patty, MD;  Location: [facility] INVASIVE CV LAB;  Service: Cardiovascular;  Laterality: N/A;   RIGHT/LEFT HEART CATH AND CORONARY ANGIOGRAPHY N/A 07/16/2023   Procedure: RIGHT/LEFT HEART CATH AND CORONARY ANGIOGRAPHY;  Surgeon: Dolores Patty, MD;  Location: [facility] INVASIVE CV LAB;  Service: Cardiovascular;  Laterality: N/A;   TEE WITHOUT CARDIOVERSION N/A 07/30/2023   Procedure: TRANSESOPHAGEAL ECHOCARDIOGRAM;  Surgeon: Lovett Sox, MD;  Location: [facility] OR;  Service: Open Heart Surgery;  Laterality: N/A;   TOOTH EXTRACTION N/A 07/23/2023   Procedure: DENTAL RESTORATION/EXTRACTIONS;  Surgeon: Ocie Doyne, DMD;  Location: [facility] OR;  Service: Oral Surgery;  Laterality: N/A;   WISDOM TOOTH EXTRACTION      Social History   Tobacco Use  Smoking Status Every Day   Types: Cigarettes  Smokeless Tobacco Current   Types: Chew    Social History   Substance and Sexual Activity  Alcohol Use Not Currently    Social History   Socioeconomic History   Marital status: Married    Spouse name: Not on  file   Number of children: Not on file   Years of education: Not on file   Highest education level: Not on file  Occupational History   Not on file  Tobacco Use   Smoking status: Every Day    Types: Cigarettes   Smokeless tobacco: Current    Types: Chew  Substance and Sexual Activity   Alcohol use: Not Currently   Drug use: Not Currently    Comment: last use "many years ago"   Sexual activity: Yes  Other Topics Concern   Not on file  Social History Narrative   Not on file   Social Determinants of Health   Financial Resource Strain: Medium Risk (08/12/2023)   Overall Financial Resource Strain (CARDIA)    Difficulty of Paying Living Expenses: Somewhat hard  Food Insecurity: No Food Insecurity (09/29/2023)   Hunger Vital Sign    Worried About Running Out of Food in the Last Year: Never true    Ran Out of Food in the Last Year: Never true  Transportation Needs: No Transportation Needs (09/29/2023)   PRAPARE - Administrator, Civil Service (Medical): No    Lack of Transportation (Non-Medical): No  Physical Activity: Sufficiently Active (07/14/2023)   Exercise Vital Sign    Days of Exercise per Week: 3 days    Minutes of Exercise per Session: 60 min  Stress: Stress Concern Present (07/14/2023)   [employer] of Occupational Health - Occupational Stress Questionnaire    Feeling of Stress : To some extent  Social Connections: Not on file  Intimate Partner Violence: Not At Risk (09/29/2023)   Humiliation, Afraid, Rape, and Kick questionnaire    Fear of Current or Ex-Partner: No    Emotionally Abused: No    Physically Abused: No    Sexually Abused: No    Allergies  Allergen Reactions   Other Anaphylaxis    Tree Nuts   Peanuts [Peanut Oil] Anaphylaxis    Current Facility-Administered Medications  Medication Dose Route Frequency Provider Last Rate Last Admin   0.9 %  sodium chloride infusion (Manually program via Guardrails IV Fluids)   Intravenous Once  Lovett Sox, MD       0.9 %  sodium chloride infusion (Manually program via Guardrails IV Fluids)   Intravenous Once Lovett Sox, MD       acetaminophen (TYLENOL) tablet 650 mg  650 mg Oral Q4H PRN Lee, [place], NP       albuterol (VENTOLIN HFA) 108 (90 Base) MCG/ACT inhaler 2 puff  2 puff Inhalation Q6H PRN Laurey Morale, MD       DAPTOmycin (CUBICIN) IVPB 500 mg/42mL premix  8 mg/kg Intravenous Q1400 Gardner Candle, RPH   Stopped at 09/30/23 0441   digoxin (LANOXIN) tablet 0.125 mg  0.125 mg Oral Daily Lee, [place], NP   0.125 mg at 09/30/23 0821   ivabradine (  CORLANOR) tablet 5 mg  5 mg Oral BID WC Lee, [place], NP   5 mg at 09/30/23 4782   losartan (COZAAR) tablet 25 mg  25 mg Oral BID Lee, [place], NP   25 mg at 09/30/23 9562   mexiletine (MEXITIL) capsule 200 mg  200 mg Oral BID Lee, [place], NP   200 mg at 09/30/23 1308   morphine (PF) 2 MG/ML injection 4 mg  4 mg Intravenous Q2H PRN Lovett Sox, MD       oxyCODONE (Oxy IR/ROXICODONE) immediate release tablet 5-10 mg  5-10 mg Oral Q6H PRN Alen Bleacher, NP   10 mg at 09/30/23 6578   pantoprazole (PROTONIX) EC tablet 40 mg  40 mg Oral Daily Lee, [place], NP   40 mg at 09/30/23 4696   sertraline (ZOLOFT) tablet 25 mg  25 mg Oral Daily Lee, [place], NP   25 mg at 09/30/23 2952   traZODone (DESYREL) tablet 50 mg  50 mg Oral QHS PRN Lee, [place], NP   50 mg at 09/29/23 2147     Family History  Problem Relation Age of Onset   Crohn's disease Maternal Grandmother    Crohn's disease Maternal Uncle    Hypertension Mother        Living   Congestive Heart Failure Maternal Grandmother    Hypertension Other        Maternal Aunts & Uncles     Review of Systems:     Cardiac Review of Systems: Y or N  Chest Pain [    ]  Resting SOB [   ] Exertional SOB  [  ]  Orthopnea [  ]   Pedal Edema [   ]    Palpitations [  ] Syncope  [  ]   Presyncope [   ]  General Review of Systems: [Y] = yes [  ]=no Constitional: recent weight change [  ];  anorexia [  ]; fatigue [  ]; nausea [  ]; night sweats [  ]; fever [  ]; or chills [  ];                                                                                                                                          Dental: poor dentition[  ]; Last Dentist visit: 1 year  Eye : blurred vision [  ]; diplopia [   ]; vision changes [  ];  Amaurosis fugax[  ]; Resp: cough [  ];  wheezing[  ];  hemoptysis[  ]; shortness of breath[  ]; paroxysmal nocturnal dyspnea[  ]; dyspnea on exertion[  ]; or orthopnea[  ];  GI:  gallstones[  ], vomiting[  ];  dysphagia[  ]; melena[  ];  hematochezia [  ]; heartburn[  ];   Hx of  Colonoscopy[  ]; positive for abdominal wall tenderness over the VAD  tunnel GU: kidney stones [  ]; hematuria[  ];   dysuria [  ];  nocturia[  ];  history of     obstruction [  ];                 Skin: rash, swelling[  ];, hair loss[  ];  peripheral edema[  ];  or itching[  ]; Musculosketetal: myalgias[  ];  joint swelling[  ];  joint erythema[  ];  joint pain[  ];  back pain[  ];  Heme/Lymph: bruising[  ];  bleeding[  ];  anemia[  ];  Neuro: TIA[  ];  headaches[  ];  stroke[  ];  vertigo[  ];  seizures[  ];   paresthesias[  ];  difficulty walking[  ];  Psych:depression[  ]; anxiety[  ];  Endocrine: diabetes[  ];  thyroid dysfunction[  ];  Immunizations: Flu [  ]; Pneumococcal[  ];  Other:  Physical Exam: BP 100/88   Pulse 88   Temp 99 F (37.2 C) (Oral)   Resp 14   Wt 65 kg   SpO2 95%   BMI 22.44 kg/m       Physical Exam  General: Patient anxious and complaining of lower abdominal tenderness. HEENT: Normocephalic pupils equal , dentition adequate Neck: Supple without JVD, adenopathy, or bruit Chest: Clear to auscultation, symmetrical breath sounds, no rhonchi, no tenderness             or deformity Cardiovascular: Regular rate and rhythm, normal VAD hum,, no gallop, peripheral pulses weak but extremities warm             Abdomen:  Soft, nontender, no palpable  mass or organomegaly Extremities: Warm, well-perfused, no clubbing cyanosis edema or tenderness,              no venous stasis changes of the legs Rectal/GU: Deferred Neuro: Grossly non--focal and symmetrical throughout Skin: Clean and dry without rash or ulceration. Power cord exit site with blood draining, minimal purulent material at this time.   Diagnostic Studies & Laboratory data:     Recent Radiology Findings:   No results found.    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  He will need surgical debridement to open the infected tunnel, remove infected tissue and place a wound VAC drainage system.  Surgery is planned for tomorrow.  I discussed the procedure in detail with the patient and his family.  The sudden bleeding following his trip to CT will be closely observed and if it does not respond to conservative measure (normalization of INR, hand-held pressure on the tunnel), the patient may need to undergo surgery sooner.  P Donata Clay  MD

## 2023-09-30 NOTE — Progress Notes (Signed)
Pharmacy Antibiotic Note  Joel York is a 37 y.o. male admitted on 09/29/2023 with LVAD driveline infection.  Wound cx 10/7 grew moderate staph aureus - sensitivities to be determined.  Pharmacy has been consulted for daptomycin dosing per discussion with Dr. Daiva Eves from ID.  Plan: Daptomycin 8 mg/kg q 24 hrs. Plan for I&D 10/9   Weight: 65 kg (143 lb 4.8 oz)  Temp (24hrs), Avg:98.3 F (36.8 C), Min:97.6 F (36.4 C), Max:99 F (37.2 C)  Recent Labs  Lab 09/29/23 1300 09/29/23 1401 09/30/23 0221  WBC  --  11.0* 8.6  CREATININE 0.83  --  0.91    Estimated Creatinine Clearance: 103.2 mL/min (by C-G formula based on SCr of 0.91 mg/dL).    Allergies  Allergen Reactions   Other Anaphylaxis    Tree Nuts   Peanuts [Peanut Oil] Anaphylaxis    Antimicrobials this admission:  Vanc x 1 10/7 Cefepime x 1 10/7 Daptomycin 10/7 >   Dose adjustments this admission:   Microbiology results:  10/7 MRSA pcr neg 10/7 DLI wound Cx staph aureus    Leota Sauers Pharm.D. CPP, BCPS Clinical Pharmacist [phone] 09/30/2023 11:12 AM    [facility] pharmacy phone numbers are listed on [email or web address]

## 2023-09-30 NOTE — TOC Initial Note (Signed)
Transition of Care [facility]) - Initial/Assessment Note    Patient Details  Name: Joel York MRN: [id number] Date of Birth: 1986/05/03  Transition of Care [facility]) CM/SW Contact:    Nicanor Bake Phone Number: [phone] 09/30/2023, 11:05 AM  Clinical Narrative:   [facility] CSW attempted to meet with pt at bedside. Pt was being seen by doctor. CSW will follow up with pt at a later time.   TOC will continue following.                       Patient Goals and CMS Choice            Expected Discharge Plan and Services                                              Prior Living Arrangements/Services                       Activities of Daily Living   ADL Screening (condition at time of admission) Independently performs ADLs?: Yes (appropriate for developmental age) Is the patient deaf or have difficulty hearing?: No Does the patient have difficulty seeing, even when wearing glasses/contacts?: No Does the patient have difficulty concentrating, remembering, or making decisions?: No  Permission Sought/Granted                  Emotional Assessment              Admission diagnosis:  Infection associated with driveline of left ventricular assist device (LVAD) (HCC) [M57.7XXA] Patient Active Problem List   Diagnosis Date Noted   Infection associated with driveline of left ventricular assist device (LVAD) (HCC) 09/29/2023   AKI (acute kidney injury) (HCC) 07/14/2023   Acute on chronic systolic CHF (congestive heart failure) (HCC) 07/14/2023   Elevated troponin 07/14/2023   Sinus tachycardia 07/14/2023   PVC (premature ventricular contraction) 07/14/2023   Acute decompensated heart failure (HCC) 07/14/2023   History of asthma 07/14/2023   Myocardial injury 07/14/2023   Polycythemia 07/14/2023   Chest pain 07/14/2023   Transaminitis 07/14/2023   Elevated bilirubin 07/14/2023   CHF exacerbation (HCC) 07/14/2023   ADD (attention deficit  disorder) 08/29/2015   Visit for preventive health examination 02/21/2015   Mild persistent asthma 02/14/2015   Attention deficit disorder 01/24/2011   SEIZURE DISORDER 09/14/2010   ANXIETY STATE, UNSPECIFIED 11/06/2009   BICEPS TENDINITIS, LEFT 11/22/2008   ASTHMA 09/26/2008   PCP:  Oneita Hurt, No Pharmacy:   [id number] [place], [place] - [address] AT [facility] OF [address] [place] [address] Phone: [phone] Fax: [phone]  [facility] [address] [place] [address] Phone: [phone] Fax: [phone]     Social Determinants of Health (SDOH) Social History: SDOH Screenings   Food Insecurity: No Food Insecurity (09/29/2023)  Housing: Low Risk  (09/29/2023)  Transportation Needs: No Transportation Needs (09/29/2023)  Utilities: Not At Risk (09/29/2023)  Alcohol Screen: Low Risk  (07/14/2023)  Financial Resource Strain: Medium Risk (08/12/2023)  Physical Activity: Sufficiently Active (07/14/2023)  Stress: Stress Concern Present (07/14/2023)  Tobacco Use: High Risk (09/29/2023)   SDOH Interventions:     Readmission Risk Interventions     No data to display

## 2023-09-30 NOTE — TOC Progression Note (Signed)
Transition of Care [facility]) - Progression Note    Patient Details  Name: Joel York MRN: [id number] Date of Birth: [birth year]  [facility]) CM/SW Contact  Nicanor Bake Phone Number: [phone] 09/30/2023, 2:52 PM  Clinical Narrative:   [facility] CSW attempted to meet with pt at bedside. Pt was sleeping. CSW will follow up with pt at a later time.   TOC will continue following.          Expected Discharge Plan and Services                                               Social Determinants of Health (SDOH) Interventions SDOH Screenings   Food Insecurity: No Food Insecurity (09/29/2023)  Housing: Low Risk  (09/29/2023)  Transportation Needs: No Transportation Needs (09/29/2023)  Utilities: Not At Risk (09/29/2023)  Alcohol Screen: Low Risk  (07/14/2023)  Financial Resource Strain: Medium Risk (08/12/2023)  Physical Activity: Sufficiently Active (07/14/2023)  Stress: Stress Concern Present (07/14/2023)  Tobacco Use: High Risk (09/29/2023)    Readmission Risk Interventions     No data to display

## 2023-09-30 NOTE — Progress Notes (Signed)
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  Pt lying in bed. C/o driveline pain. CT scan not done last night. Nurse called CT while I was there and they will send for the pt. VAD coordinator discussed pain with Aniceto Boss, NP who ordered pain medicine for the pt.  Driveline dressing performed see below for more details.   Pt posted for driveline debridement tomorrow afternoon with Dr Donata Clay.  Vital signs: Temp: 98.4 HR: 96 Doppler Pressure: 88 Auto BP: 105/88 (95) O2 Sat: 96% on RA Wt: 143.3 lbs     LVAD interrogation reveals:  Speed: 5600 Flow: 4.1 Power: 4.4 w PI: 4.5  Alarms: none Events: none Hematocrit: 42  Fixed speed: 5600 Low speed limit: 5300  Drive Line: Existing VAD dressing removed and site care performed using sterile technique. Drive line exit site cleansed with Chlora prep applications x 1 and then cleaned w/VASHE 2 x 2.  2 VASHE moistened 2 x 2s placed directly at exit site. Wound tunnels approximately 7cm. Aerobic culture obtained yesterday. Covered with  tape. Drive line exit site unincorporated. The velour is fully implanted at exit site. Gross amount of tan/green drainage noted on previous dressing. Erythema at exit site tracking over to counter incision and up abdomen. Rash again noted under previous tape/anchor sites with slight improvement from last week. Site is very tender along with reddened areas on the abdomen. Anchor secure. Pt will go to OR tomorrow for debridement.       Labs:  LDH trend: 129  INR trend: 2.2  WBC trend: 8.6  Anticoagulation Plan: -INR Goal: 2-2.5 -ASA Dose: off  Blood Products:  09/30/23: 2 FFP   Infection:  09/29/23>>blood cxs x 2>> no growth x 1 days 09/29/23>>driveline cx>>MODERATE STAPHYLOCOCCUS AUREUS susp pending   Adverse Events on VAD: -  Plan/Recommendations:  1. Please page  VAD coordinator for any alarms or VAD equipment issues. 2. VAD coordinator will accompany pt to OR tomorrow  Carlton Adam RN VAD Coordinator  Office: [phone]  24/7 Pager: [phone]

## 2023-09-30 NOTE — Consult Note (Signed)
[facility]    Date of Admission:  09/29/2023     Total days of antibiotics 2  Daptomycin 10/7 >> c             Reason for Consult: LVAD driveline infection     Referring Provider: VAD team  Primary Care Provider: Pcp, No   Assessment: Joel York is a 37 y.o. male s/p HM3 destination LVAD placed 2 months ago here with   LVAD Driveline Exitsite Infection -  -drainage cultures growing out staphylococcus aureus  -continue daptomycin pending susceptibilities  -MRSA PCRs have been negative in the past so hopeful this will be MSSA -CT scan with organized abscess 5.0 x 1.6 x 2.0 cm - surgery 10.9 with Dr. Donata Clay  -planning deeper cultures there.    Irritant Dermatitis -  -may need some topical steroids for that patch of dermatitis under where anchor should sit. He has had a hard time tolerating these products on his skin and keeping driveline immobilized     Plan: FU OR findings  FU pending micro (so far blood neg) Daptomycin for antibiotic coverage until further susceptibilities back     Principal Problem:   Infection associated with driveline of left ventricular assist device (LVAD) (HCC)    sodium chloride   Intravenous Once   digoxin  0.125 mg Oral Daily   ivabradine  5 mg Oral BID WC   losartan  25 mg Oral BID   mexiletine  200 mg Oral BID   pantoprazole  40 mg Oral Daily   sertraline  25 mg Oral Daily    HPI: Joel York is a 37 y.o. male admitted from LVAD clinic 10/7 for concern over deep driveline infection. HM3 implanted on 07/29/2023. Sent home with 2 weeks doxycycline after discharge that completed end of August. Since then he has had trouble with irritant dermatitis from different cleansing products   He and his wife feel that the infection started fairly quickly. No antibiotics for other reasons recently. Bloody drainage started when in vad clinic. No systemic symptoms but feels induration / bloating sensation in the  abdomen he attributed to reflux symptoms / GI process.  He is currently in pain.   CT scan has been completed and pending official read - PVT mentioned it looks like a fairly deep tunnel infection.    Review of Systems: Review of Systems  Constitutional:  Negative for chills, fever, malaise/fatigue and weight loss.  HENT:  Negative for sore throat.   Respiratory:  Negative for cough, sputum production and shortness of breath.   Cardiovascular: Negative.   Gastrointestinal:  Positive for abdominal pain. Negative for diarrhea and vomiting.  Musculoskeletal:  Negative for joint pain, myalgias and neck pain.  Skin:  Negative for rash.  Neurological:  Negative for headaches.  Psychiatric/Behavioral:  Negative for depression and substance abuse. The patient is not nervous/anxious.      Past Medical History:  Diagnosis Date   Acid reflux    ADHD (attention deficit hyperactivity disorder)    Asthma    Back pain    Seizures (HCC)    Resolved    Social History   Tobacco Use   Smoking status: Every Day    Types: Cigarettes   Smokeless tobacco: Current    Types: Chew  Substance Use Topics   Alcohol use: Not Currently   Drug use: Not Currently    Comment: last use "many years ago"  Family History  Problem Relation Age of Onset   Crohn's disease Maternal Grandmother    Crohn's disease Maternal Uncle    Hypertension Mother        Living   Congestive Heart Failure Maternal Grandmother    Hypertension Other        Maternal Aunts & Uncles   Allergies  Allergen Reactions   Other Anaphylaxis    Tree Nuts   Peanuts [Peanut Oil] Anaphylaxis    OBJECTIVE: Blood pressure 105/88, pulse 88, temperature 98.4 F (36.9 C), temperature source Oral, resp. rate 16, weight 65 kg, SpO2 96%.   Physical Exam Vitals reviewed.  Cardiovascular:     Rate and Rhythm: Normal rate.  Pulmonary:     Effort: No respiratory distress.     Breath sounds: Normal breath sounds.  Abdominal:      General: Bowel sounds are normal. There is no distension.     Palpations: Abdomen is soft.     Comments: Large clean / dry dressing over exit site   Skin:    General: Skin is warm and dry.  Neurological:     Mental Status: He is oriented to person, place, and time.     Lab Results Lab Results  Component Value Date   WBC 8.6 09/30/2023   HGB 13.7 09/30/2023   HCT 42.8 09/30/2023   MCV 88.2 09/30/2023   PLT 235 09/30/2023    Lab Results  Component Value Date   CREATININE 0.91 09/30/2023   BUN 10 09/30/2023   NA 138 09/30/2023   K 3.8 09/30/2023   CL 98 09/30/2023   CO2 31 09/30/2023    Lab Results  Component Value Date   ALT 68 (H) 08/07/2023   AST 115 (H) 08/07/2023   ALKPHOS 80 08/07/2023   BILITOT 0.4 08/07/2023      Microbiology: Recent Results (from the past 240 hour(s))  Aerobic Culture w Gram Stain (superficial specimen)     Status: None (Preliminary result)   Collection Time: 09/29/23  1:42 PM   Specimen: Abdomen  Result Value Ref Range Status   Specimen Description ABDOMEN  Final   Special Requests DRIVELINE  Final   Gram Stain RARE WBC SEEN FEW GRAM POSITIVE COCCI   Final   Culture   Final    MODERATE STAPHYLOCOCCUS AUREUS SUSCEPTIBILITIES TO FOLLOW Performed at [facility], [address], [place], [place] [address]    Report Status PENDING  Incomplete  MRSA Next Gen by PCR, Nasal     Status: None   Collection Time: 09/29/23 10:16 PM   Specimen: Nasal Mucosa; Nasal Swab  Result Value Ref Range Status   MRSA by PCR Next Gen NOT DETECTED NOT DETECTED Final    Comment: (NOTE) The GeneXpert MRSA Assay (FDA approved for NASAL specimens only), is one component of a comprehensive MRSA colonization surveillance program. It is not intended to diagnose MRSA infection nor to guide or monitor treatment for MRSA infections. Test performance is not FDA approved in patients less than 32 years old. Performed at [facility], [address], [place], [place] [address]      Rexene Alberts, MSN, NP-C [facility]  [email or web address] Pager: [phone] Office: [phone] [facility] Main Line: [phone] *Secure Chat Communication Welcome

## 2023-09-30 NOTE — Progress Notes (Signed)
CSW met briefly at bedside. Patient reports that he will have a debridement tomorrow and hopeful  for some improvement with site. CSW provided patient with an adult coloring book which has helped him to relieve stress in the past. He was grateful for the support and book. CSW provided supportive intervention and will continue to follow as needed. Lasandra Beech, LCSW, CCSW-MCS [phone]

## 2023-10-01 ENCOUNTER — Other Ambulatory Visit: Payer: Self-pay

## 2023-10-01 ENCOUNTER — Encounter (HOSPITAL_COMMUNITY): Payer: Self-pay | Admitting: Cardiology

## 2023-10-01 ENCOUNTER — Inpatient Hospital Stay (HOSPITAL_COMMUNITY): Payer: Medicaid Other

## 2023-10-01 ENCOUNTER — Encounter (HOSPITAL_COMMUNITY)
Admission: AD | Disposition: A | Discharge status: Home Health | Payer: Self-pay | Source: Ambulatory Visit | Attending: Cardiology

## 2023-10-01 DIAGNOSIS — T827XXA Infection and inflammatory reaction due to other cardiac and vascular devices, implants and grafts, initial encounter: Secondary | ICD-10-CM | POA: Diagnosis not present

## 2023-10-01 DIAGNOSIS — F17210 Nicotine dependence, cigarettes, uncomplicated: Secondary | ICD-10-CM

## 2023-10-01 DIAGNOSIS — I509 Heart failure, unspecified: Secondary | ICD-10-CM | POA: Diagnosis not present

## 2023-10-01 DIAGNOSIS — Z4801 Encounter for change or removal of surgical wound dressing: Secondary | ICD-10-CM | POA: Diagnosis not present

## 2023-10-01 DIAGNOSIS — A4901 Methicillin susceptible Staphylococcus aureus infection, unspecified site: Secondary | ICD-10-CM | POA: Diagnosis not present

## 2023-10-01 HISTORY — PX: APPLICATION OF WOUND VAC: SHX5189

## 2023-10-01 HISTORY — PX: STERNAL WOUND DEBRIDEMENT: SHX1058

## 2023-10-01 LAB — CBC
HCT: 42.8 % (ref 39.0–52.0)
Hemoglobin: 13.6 g/dL (ref 13.0–17.0)
MCH: 27.5 pg (ref 26.0–34.0)
MCHC: 31.8 g/dL (ref 30.0–36.0)
MCV: 86.5 fL (ref 80.0–100.0)
Platelets: 275 10*3/uL (ref 150–400)
RBC: 4.95 MIL/uL (ref 4.22–5.81)
RDW: 14.3 % (ref 11.5–15.5)
WBC: 7.7 10*3/uL (ref 4.0–10.5)
nRBC: 0 % (ref 0.0–0.2)

## 2023-10-01 LAB — CK: Total CK: 44 U/L — ABNORMAL LOW (ref 49–397)

## 2023-10-01 LAB — PREPARE FRESH FROZEN PLASMA: Unit division: 0

## 2023-10-01 LAB — BPAM FFP
Blood Product Expiration Date: 202410132359
Blood Product Expiration Date: 202410132359
ISSUE DATE / TIME: 202410081017
ISSUE DATE / TIME: 202410081133
Unit Type and Rh: 8400
Unit Type and Rh: 8400

## 2023-10-01 LAB — BASIC METABOLIC PANEL
Anion gap: 8 (ref 5–15)
BUN: 9 mg/dL (ref 6–20)
CO2: 29 mmol/L (ref 22–32)
Calcium: 9.4 mg/dL (ref 8.9–10.3)
Chloride: 101 mmol/L (ref 98–111)
Creatinine, Ser: 0.9 mg/dL (ref 0.61–1.24)
GFR, Estimated: >60 mL/min (ref >60)
Glucose, Bld: 96 mg/dL (ref 70–99)
Potassium: 4.4 mmol/L (ref 3.5–5.1)
Sodium: 138 mmol/L (ref 135–145)

## 2023-10-01 LAB — PROTIME-INR
INR: 1.4 — ABNORMAL HIGH (ref 0.8–1.2)
Prothrombin Time: 17.3 s — ABNORMAL HIGH (ref 11.4–15.2)

## 2023-10-01 LAB — LACTATE DEHYDROGENASE: LDH: 142 U/L (ref 98–192)

## 2023-10-01 LAB — APTT: aPTT: 39 s — ABNORMAL HIGH (ref 24–36)

## 2023-10-01 SURGERY — DEBRIDEMENT, WOUND, STERNUM
Anesthesia: General

## 2023-10-01 MED ORDER — ACETAMINOPHEN 10 MG/ML IV SOLN
1000.0000 mg | Freq: Once | INTRAVENOUS | Status: DC | PRN
  Administered 2023-10-01: 1000 mg via INTRAVENOUS

## 2023-10-01 MED ORDER — ZINC SULFATE 220 (50 ZN) MG PO CAPS
220.0000 mg | ORAL_CAPSULE | Freq: Every day | ORAL | Status: DC
  Administered 2023-10-01 – 2023-10-16 (×16): 220 mg via ORAL
  Filled 2023-10-01 (×16): qty 1

## 2023-10-01 MED ORDER — ONDANSETRON HCL 4 MG/2ML IJ SOLN
INTRAMUSCULAR | Status: DC | PRN
  Administered 2023-10-01: 4 mg via INTRAVENOUS

## 2023-10-01 MED ORDER — LIDOCAINE 2% (20 MG/ML) 5 ML SYRINGE
INTRAMUSCULAR | Status: DC | PRN
  Administered 2023-10-01: 100 mg via INTRAVENOUS

## 2023-10-01 MED ORDER — SODIUM CHLORIDE 0.9 % IR SOLN
Status: DC | PRN
  Administered 2023-10-01: 1000 mL

## 2023-10-01 MED ORDER — VASHE WOUND IRRIGATION OPTIME
TOPICAL | Status: DC | PRN
Start: 2023-10-01 — End: 2023-10-01
  Administered 2023-10-01: 34 [oz_av]

## 2023-10-01 MED ORDER — FENTANYL CITRATE (PF) 250 MCG/5ML IJ SOLN
INTRAMUSCULAR | Status: DC | PRN
  Administered 2023-10-01 (×3): 50 ug via INTRAVENOUS

## 2023-10-01 MED ORDER — ONDANSETRON HCL 4 MG/2ML IJ SOLN
INTRAMUSCULAR | Status: AC
  Filled 2023-10-01: qty 2

## 2023-10-01 MED ORDER — PHENYLEPHRINE HCL-NACL 20-0.9 MG/250ML-% IV SOLN
INTRAVENOUS | Status: DC | PRN
  Administered 2023-10-01 (×2): 80 ug via INTRAVENOUS

## 2023-10-01 MED ORDER — PROPOFOL 10 MG/ML IV BOLUS
INTRAVENOUS | Status: AC
  Filled 2023-10-01: qty 20

## 2023-10-01 MED ORDER — OXYCODONE HCL 5 MG PO TABS: 5.0000 mg | ORAL_TABLET | Freq: Once | ORAL | Status: DC | PRN

## 2023-10-01 MED ORDER — KETOROLAC TROMETHAMINE 30 MG/ML IJ SOLN
15.0000 mg | Freq: Four times a day (QID) | INTRAMUSCULAR | Status: AC
  Administered 2023-10-02 (×2): 15 mg via INTRAVENOUS
  Filled 2023-10-01 (×2): qty 1

## 2023-10-01 MED ORDER — ACETAMINOPHEN 10 MG/ML IV SOLN
INTRAVENOUS | Status: AC
  Filled 2023-10-01: qty 100

## 2023-10-01 MED ORDER — ORAL CARE MOUTH RINSE
15.0000 mL | Freq: Once | OROMUCOSAL | Status: AC
  Administered 2023-10-01: 15 mL via OROMUCOSAL

## 2023-10-01 MED ORDER — HEPARIN (PORCINE) 25000 UT/250ML-% IV SOLN
500.0000 [IU]/h | INTRAVENOUS | Status: DC
  Administered 2023-10-02: 500 [IU]/h via INTRAVENOUS
  Filled 2023-10-01: qty 250

## 2023-10-01 MED ORDER — SUGAMMADEX SODIUM 200 MG/2ML IV SOLN
INTRAVENOUS | Status: DC | PRN
  Administered 2023-10-01: 250 mg via INTRAVENOUS

## 2023-10-01 MED ORDER — HYDROMORPHONE HCL 1 MG/ML IJ SOLN
2.0000 mg | INTRAMUSCULAR | Status: DC | PRN
  Administered 2023-10-01 – 2023-10-05 (×24): 2 mg via INTRAVENOUS
  Filled 2023-10-01 (×25): qty 2

## 2023-10-01 MED ORDER — MORPHINE SULFATE (PF) 2 MG/ML IV SOLN
4.0000 mg | INTRAVENOUS | Status: DC | PRN
  Administered 2023-10-01: 4 mg via INTRAVENOUS
  Filled 2023-10-01: qty 2

## 2023-10-01 MED ORDER — CEFAZOLIN SODIUM-DEXTROSE 2-4 GM/100ML-% IV SOLN
2.0000 g | Freq: Three times a day (TID) | INTRAVENOUS | Status: DC
  Administered 2023-10-01 – 2023-10-16 (×45): 2 g via INTRAVENOUS
  Filled 2023-10-01 (×43): qty 100

## 2023-10-01 MED ORDER — FENTANYL CITRATE (PF) 250 MCG/5ML IJ SOLN
INTRAMUSCULAR | Status: AC
  Filled 2023-10-01: qty 5

## 2023-10-01 MED ORDER — OXYCODONE HCL 5 MG PO TABS
5.0000 mg | ORAL_TABLET | ORAL | Status: DC | PRN
  Administered 2023-10-01: 5 mg via ORAL
  Administered 2023-10-02 – 2023-10-04 (×7): 10 mg via ORAL
  Administered 2023-10-04: 5 mg via ORAL
  Administered 2023-10-04 – 2023-10-05 (×2): 10 mg via ORAL
  Administered 2023-10-05 (×2): 5 mg via ORAL
  Administered 2023-10-06 – 2023-10-07 (×5): 10 mg via ORAL
  Administered 2023-10-07: 5 mg via ORAL
  Administered 2023-10-07: 10 mg via ORAL
  Administered 2023-10-08 – 2023-10-10 (×2): 5 mg via ORAL
  Administered 2023-10-10: 10 mg via ORAL
  Filled 2023-10-01 (×4): qty 2
  Filled 2023-10-01: qty 1
  Filled 2023-10-01 (×6): qty 2
  Filled 2023-10-01: qty 1
  Filled 2023-10-01: qty 2
  Filled 2023-10-01: qty 1
  Filled 2023-10-01 (×7): qty 2
  Filled 2023-10-01 (×2): qty 1
  Filled 2023-10-01: qty 2

## 2023-10-01 MED ORDER — PROPOFOL 10 MG/ML IV BOLUS
INTRAVENOUS | Status: DC | PRN
  Administered 2023-10-01: 100 mg via INTRAVENOUS

## 2023-10-01 MED ORDER — MIDAZOLAM HCL 2 MG/2ML IJ SOLN
INTRAMUSCULAR | Status: AC
  Filled 2023-10-01: qty 2

## 2023-10-01 MED ORDER — FENTANYL CITRATE (PF) 100 MCG/2ML IJ SOLN
INTRAMUSCULAR | Status: AC
  Filled 2023-10-01: qty 2

## 2023-10-01 MED ORDER — CHLORHEXIDINE GLUCONATE 0.12 % MT SOLN: 15.0000 mL | Freq: Once | OROMUCOSAL | Status: AC

## 2023-10-01 MED ORDER — KETOROLAC TROMETHAMINE 30 MG/ML IJ SOLN
30.0000 mg | Freq: Four times a day (QID) | INTRAMUSCULAR | Status: DC
  Administered 2023-10-01: 30 mg via INTRAVENOUS
  Filled 2023-10-01: qty 1

## 2023-10-01 MED ORDER — ALBUMIN HUMAN 5 % IV SOLN
INTRAVENOUS | Status: DC | PRN
Start: 2023-10-01 — End: 2023-10-01

## 2023-10-01 MED ORDER — KETOROLAC TROMETHAMINE 15 MG/ML IJ SOLN
INTRAMUSCULAR | Status: AC
  Filled 2023-10-01: qty 1

## 2023-10-01 MED ORDER — FENTANYL CITRATE (PF) 100 MCG/2ML IJ SOLN
25.0000 ug | INTRAMUSCULAR | Status: DC | PRN
  Administered 2023-10-01 (×3): 50 ug via INTRAVENOUS

## 2023-10-01 MED ORDER — ROCURONIUM BROMIDE 10 MG/ML (PF) SYRINGE
PREFILLED_SYRINGE | INTRAVENOUS | Status: DC | PRN
  Administered 2023-10-01: 80 mg via INTRAVENOUS

## 2023-10-01 MED ORDER — ENSURE ENLIVE PO LIQD
237.0000 mL | Freq: Three times a day (TID) | ORAL | Status: DC
  Administered 2023-10-02 – 2023-10-11 (×17): 237 mL via ORAL

## 2023-10-01 MED ORDER — OXYCODONE HCL 5 MG/5ML PO SOLN: 5.0000 mg | Freq: Once | ORAL | Status: DC | PRN

## 2023-10-01 MED ORDER — DEXMEDETOMIDINE HCL IN NACL 80 MCG/20ML IV SOLN
INTRAVENOUS | Status: DC | PRN
Start: 2023-10-01 — End: 2023-10-01
  Administered 2023-10-01 (×2): 8 ug via INTRAVENOUS

## 2023-10-01 MED ORDER — ONDANSETRON HCL 4 MG/2ML IJ SOLN: 4.0000 mg | Freq: Once | INTRAMUSCULAR | Status: DC | PRN

## 2023-10-01 MED ORDER — LACTATED RINGERS IV SOLN: INTRAVENOUS | Status: DC

## 2023-10-01 MED ORDER — MIDAZOLAM HCL 5 MG/5ML IJ SOLN
INTRAMUSCULAR | Status: DC | PRN
  Administered 2023-10-01: 2 mg via INTRAVENOUS

## 2023-10-01 SURGICAL SUPPLY — 73 items
APL SKNCLS STERI-STRIP NONHPOA (GAUZE/BANDAGES/DRESSINGS) ×2
ATTRACTOMAT 16X20 MAGNETIC DRP (DRAPES) ×1 IMPLANT
BAG DECANTER FOR FLEXI CONT (MISCELLANEOUS) ×1 IMPLANT
BENZOIN TINCTURE PRP APPL 2/3 (GAUZE/BANDAGES/DRESSINGS) IMPLANT
BLADE CLIPPER SURG (BLADE) ×1 IMPLANT
BLADE SURG 10 STRL SS (BLADE) ×1 IMPLANT
BLADE SURG 15 STRL LF DISP TIS (BLADE) IMPLANT
BLADE SURG 15 STRL SS (BLADE)
BNDG GAUZE DERMACEA FLUFF 4 (GAUZE/BANDAGES/DRESSINGS) IMPLANT
BNDG GZE DERMACEA 4 6PLY (GAUZE/BANDAGES/DRESSINGS)
CANISTER SUCT 3000ML PPV (MISCELLANEOUS) ×1 IMPLANT
CANISTER WOUND CARE 500ML ATS (WOUND CARE) ×1 IMPLANT
CATH FOLEY 2WAY SLVR 5CC 16FR (CATHETERS) IMPLANT
CATH THORACIC 28FR RT ANG (CATHETERS) IMPLANT
CATH THORACIC 36FR (CATHETERS) IMPLANT
CLEANSER WND VASHE INSTL 34OZ (WOUND CARE) IMPLANT
CLIP TI WIDE RED SMALL 24 (CLIP) IMPLANT
CNTNR URN SCR LID CUP LEK RST (MISCELLANEOUS) IMPLANT
CONN Y 3/8X3/8X3/8 BEN (MISCELLANEOUS) IMPLANT
CONT SPEC 4OZ STRL OR WHT (MISCELLANEOUS) ×1
CONTAINER PROTECT SURGISLUSH (MISCELLANEOUS) ×2 IMPLANT
COVER SURGICAL LIGHT HANDLE (MISCELLANEOUS) ×2 IMPLANT
DRAPE DERMATAC (DRAPES) IMPLANT
DRAPE LAPAROSCOPIC ABDOMINAL (DRAPES) ×1 IMPLANT
DRAPE SLUSH/WARMER DISC (DRAPES) IMPLANT
DRAPE WARM FLUID 44X44 (DRAPES) IMPLANT
DRSG AQUACEL AG ADV 3.5X14 (GAUZE/BANDAGES/DRESSINGS) ×1 IMPLANT
DRSG VAC GRANUFOAM LG (GAUZE/BANDAGES/DRESSINGS) ×1 IMPLANT
DRSG VAC GRANUFOAM MED (GAUZE/BANDAGES/DRESSINGS) ×1 IMPLANT
DRSG VAC GRANUFOAM SM (GAUZE/BANDAGES/DRESSINGS) ×1 IMPLANT
ELECT REM PT RETURN 9FT ADLT (ELECTROSURGICAL) ×1
ELECTRODE REM PT RTRN 9FT ADLT (ELECTROSURGICAL) ×1 IMPLANT
GAUZE 4X4 16PLY ~~LOC~~+RFID DBL (SPONGE) ×1 IMPLANT
GAUZE PAD ABD 8X10 STRL (GAUZE/BANDAGES/DRESSINGS) IMPLANT
GAUZE SPONGE 4X4 12PLY STRL (GAUZE/BANDAGES/DRESSINGS) ×1 IMPLANT
GAUZE XEROFORM 5X9 LF (GAUZE/BANDAGES/DRESSINGS) IMPLANT
GLOVE BIO SURGEON STRL SZ 6 (GLOVE) IMPLANT
GLOVE BIO SURGEON STRL SZ7.5 (GLOVE) ×2 IMPLANT
GLOVE SS BIOGEL STRL SZ 6 (GLOVE) IMPLANT
GOWN STRL REUS W/ TWL LRG LVL3 (GOWN DISPOSABLE) ×4 IMPLANT
GOWN STRL REUS W/TWL LRG LVL3 (GOWN DISPOSABLE) ×5
HANDPIECE INTERPULSE COAX TIP (DISPOSABLE) ×1
HEMOSTAT POWDER SURGIFOAM 1G (HEMOSTASIS) IMPLANT
HEMOSTAT SURGICEL 2X14 (HEMOSTASIS) IMPLANT
KIT BASIN OR (CUSTOM PROCEDURE TRAY) ×1 IMPLANT
KIT SUCTION CATH 14FR (SUCTIONS) IMPLANT
KIT TURNOVER KIT B (KITS) ×1 IMPLANT
NS IRRIG 1000ML POUR BTL (IV SOLUTION) ×1 IMPLANT
PACK CHEST (CUSTOM PROCEDURE TRAY) ×1 IMPLANT
PACK GENERAL/GYN (CUSTOM PROCEDURE TRAY) ×1 IMPLANT
PAD ARMBOARD 7.5X6 YLW CONV (MISCELLANEOUS) ×2 IMPLANT
PULSAVAC PLUS IRRIG FAN TIP (DISPOSABLE) ×1
SET HNDPC FAN SPRY TIP SCT (DISPOSABLE) ×1 IMPLANT
SOL PREP POV-IOD 4OZ 10% (MISCELLANEOUS) IMPLANT
SPONGE T-LAP 18X18 ~~LOC~~+RFID (SPONGE) ×5 IMPLANT
SPONGE T-LAP 4X18 ~~LOC~~+RFID (SPONGE) ×1 IMPLANT
STAPLER VISISTAT 35W (STAPLE) IMPLANT
SUT ETHILON 3 0 FSL (SUTURE) IMPLANT
SUT STEEL 6MS V (SUTURE) IMPLANT
SUT STEEL STERNAL CCS#1 18IN (SUTURE) IMPLANT
SUT STEEL SZ 6 DBL 3X14 BALL (SUTURE) IMPLANT
SUT VIC AB 1 CTX 36 (SUTURE) ×2
SUT VIC AB 1 CTX36XBRD ANBCTR (SUTURE) ×2 IMPLANT
SUT VIC AB 2-0 CTX 27 (SUTURE) ×2 IMPLANT
SUT VIC AB 3-0 X1 27 (SUTURE) ×2 IMPLANT
SWAB COLLECTION DEVICE MRSA (MISCELLANEOUS) IMPLANT
SWAB CULTURE ESWAB REG 1ML (MISCELLANEOUS) IMPLANT
SYR 5ML LL (SYRINGE) IMPLANT
TIP FAN IRRIG PULSAVAC PLUS (DISPOSABLE) IMPLANT
TOWEL GREEN STERILE (TOWEL DISPOSABLE) ×1 IMPLANT
TOWEL GREEN STERILE FF (TOWEL DISPOSABLE) ×1 IMPLANT
TRAY FOLEY MTR SLVR 16FR STAT (SET/KITS/TRAYS/PACK) IMPLANT
WATER STERILE IRR 1000ML POUR (IV SOLUTION) ×1 IMPLANT

## 2023-10-01 NOTE — Progress Notes (Signed)
Day of Surgery Procedure(s) (LRB): VAD TUNNEL DEBRIDEMENT (N/A) APPLICATION OF WOUND VAC (N/A) Subjective: Patient examined and Echocardiogram images reviewed. Patient resting comfortably prior to planned VAD tunnel debridement. No more bleeding from VAD tunnel, Hct stable at 42%. INR down to 1.3 . VAD tunnel cultures growing staph.   Echo shows improved RV function, septum midline and inflow cannula position fine.  Objective: Vital signs in last 24 hours: Temp:  [98 F (36.7 C)-98.4 F (36.9 C)] 98.2 F (36.8 C) (10/09 0830) Pulse Rate:  [66-103] 82 (10/09 1000) Cardiac Rhythm: Normal sinus rhythm (10/09 0800) Resp:  [9-17] 14 (10/09 1000) BP: (77-111)/(49-91) 104/79 (10/09 1000) SpO2:  [90 %-100 %] 96 % (10/09 1000) Weight:  [63.1 kg] 63.1 kg (10/09 0500)  Hemodynamic parameters for last 24 hours:  Afebrile, nsr  Intake/Output from previous day: 10/08 0701 - 10/09 0700 In: 810.9 [I.V.:292.9; Blood:518] Out: 950 [Urine:950] Intake/Output this shift: No intake/output data recorded.  EXAM Stable MAP, HR Normal VAD hum Lungs clear Abd tenderness over distal  VAD tunnel stable   Lab Results: Recent Labs    09/30/23 1106 10/01/23 0545  WBC 9.7 7.7  HGB 13.9 13.6  HCT 43.1 42.8  PLT 270 275   BMET:  Recent Labs    09/30/23 0905 10/01/23 0545  NA 139 138  K 4.5 4.4  CL 98 101  CO2 27 29  GLUCOSE 85 96  BUN 9 9  CREATININE 0.93 0.90  CALCIUM 9.2 9.4    PT/INR:  Recent Labs    10/01/23 0545  LABPROT 17.3*  INR 1.4*   ABG    Component Value Date/Time   PHART 7.510 (H) 08/03/2023 0445   HCO3 27.3 08/03/2023 0445   TCO2 28 08/03/2023 0445   ACIDBASEDEF 2.0 07/31/2023 1704   O2SAT 68.1 08/08/2023 0535   CBG (last 3)  No results for input(s): "GLUCAP" in the last 72 hours.  Assessment/Plan: S/P Procedure(s) (LRB): VAD TUNNEL DEBRIDEMENT (N/A) APPLICATION OF WOUND VAC (N/A) Ready for VAD tunnel debridement in OR under anesthesia.  Cont iv  daptomycin, hold coumadin, resume low dose iv heparin delayed postop.  LOS: 2 days    Lovett Sox 10/01/2023

## 2023-10-01 NOTE — Progress Notes (Signed)
PHARMACY - ANTICOAGULATION CONSULT NOTE  Pharmacy Consult for heparin once INR < 1.8 Indication:  LVAD, Coumadin on hold  Allergies  Allergen Reactions   Other Anaphylaxis    Tree Nuts   Peanuts [Peanut Oil] Anaphylaxis   Chlorhexidine Rash    Patient Measurements: Weight: 63.1 kg (139 lb 1.8 oz)   Vital Signs: Temp: 98.2 F (36.8 C) (10/09 0830) Temp Source: Oral (10/09 0830) BP: 107/74 (10/09 0900) Pulse Rate: 91 (10/09 0900)  Labs: Recent Labs    09/30/23 0221 09/30/23 0905 09/30/23 1057 09/30/23 1106 10/01/23 0545  HGB 13.7  --   --  13.9 13.6  HCT 42.8  --   --  43.1 42.8  PLT 235  --   --  270 275  APTT  --   --   --   --  39*  LABPROT 24.6*  --  25.2*  --  17.3*  INR 2.2*  --  2.3*  --  1.4*  CREATININE 0.91 0.93  --   --  0.90  CKTOTAL  --   --   --   --  44*    Estimated Creatinine Clearance: 101.3 mL/min (by C-G formula based on SCr of 0.9 mg/dL).   Medical History: Past Medical History:  Diagnosis Date   Acid reflux    ADHD (attention deficit hyperactivity disorder)    Asthma    Back pain    Seizures (HCC)    Resolved    Medications:  Infusions:   DAPTOmycin 500 mg (09/30/23 1832)    Assessment: 37 yo male with LVAD, admitted with driveline infection.  INR 2 on admission fell to 1.4 today after FFPx2 10/8  S/p some bbleeding around - holding warfarin with plans for I&D in the OR 10/9. H/h pltc, ldh stable - oozing from power cord resolved with sliver nitrate. Will follow up post I&D for anticoagulation start  Goal of Therapy:   Monitor platelets by anticoagulation protocol: Yes   Plan:  Will begin low, fixed dose heparin at 500 units/hr after I&D Daily protime, heparin level and cbc    Leota Sauers Pharm.D. CPP, BCPS Clinical Pharmacist [phone] 10/01/2023 9:43 AM    [facility] pharmacy phone numbers are listed on [email or web address]

## 2023-10-01 NOTE — Op Note (Unsigned)
Joel York, MANHEIM MEDICAL RECORD NO: [id number] ACCOUNT NO: [id number] DATE OF BIRTH: 02/21/1986 FACILITY: [facility] LOCATION: [facility] PHYSICIAN: Kerin Perna III, MD  Operative Report   DATE OF PROCEDURE: 10/01/2023  OPERATION:   1.  Excisional Debridement of VAD tunnel[skin, subcutaneous fat] 2.  Irrigation of wound with Vashe wound solution. 3.  Placement of wound VAC system.  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  ANESTHESIA:  General.  PROCEDURE DETAILS:  The patient was taken from preoperative holding where informed consent was documented and the procedure was again reviewed with the patient including the benefits of the procedure, the expected postoperative recovery, and the risks  including recurrent infection, bleeding, and pain.  He understood and agreed to proceed.  The patient was brought back to the OR by the VAD coordinator and anesthesia.  The VAD coordinator remained with the patient during the entirety of the procedure as well as into the recovery in the postanesthesia care unit.  The patient was placed supine  on the operating room table.  He was positioned and monitored.  He was induced for general anesthesia and intubated and remained stable.  The previous dressings around the power cord were removed.  The upper abdomen and chest were prepped and draped as a sterile field.  A proper timeout was performed.  The tunnel was opened from the exit site medially towards the counter incision.  There was a space with some indurated tissue.  There was no gross pus.  Deep tissue cultures were taken and sent to microbiology.  Debridement of the tunnel was performed  using the curette to get down to healthy tissue.  The infection was above the rectus sheath fascia.  There was debridement of the skin and subcutaneous  tissue with excisional debridement.  Once the debridement was completed, hemostasis was achieved.  The wound was irrigated with a liter of Vashe wound irrigation using pulse lavage.  Hemostasis was then again checked.  A wound VAC sponge was cut to the appropriate configuration and length  of the wound and covered with the wound VAC sterile adhesive dressing.  An opening was created over the sponge for the suction catheter, which was placed and connected to the wound VAC system with good compression of the sponge and well sealed from air leak.   The patient was then reversed from anesthesia, extubated, and returned to recovery room accompanied by our VAD coordinator.   VAI D: 10/01/2023 4:23:31 pm T: 10/01/2023 8:55:00 pm  JOB: [id number]/ [id number]

## 2023-10-01 NOTE — Progress Notes (Addendum)
Advanced Heart Failure VAD Team Note  PCP-Cardiologist: None   Subjective:   09/29/23: admitted with DL infection. CT A/P with fluid collection around driveline suggestive of abscess. Wound Cx + Staph Aureus  09/30/23: developed significant bleeding from DL site>>given FFP  On Daptomycin per ID. Afebrile. WBC 7.7. BCx NG x 2 days   C/w pain around DL site. Bleeding appears to have stopped. Current dressing Gause w/ small amount of blood from yesterday. Has not required any additional dressing changes. Hgb stable 13.6   Other than pain, he has no other complaints. Denies CP and dyspnea.   Going to OR today. INR 1.4    LVAD INTERROGATION:  HeartMate II LVAD:   Flow 4.4 liters/min, speed 5600, power 4.4, PI 3.6.  12 PI events this morning  Objective:    Vital Signs:   Temp:  [98 F (36.7 C)-99 F (37.2 C)] 98.1 F (36.7 C) (10/09 0400) Pulse Rate:  [66-103] 92 (10/09 0700) Resp:  [9-17] 16 (10/09 0700) BP: (85-111)/(49-91) 93/81 (10/09 0700) SpO2:  [90 %-100 %] 95 % (10/09 0700) Weight:  [63.1 kg] 63.1 kg (10/09 0500) Last BM Date : 09/28/23 Mean arterial Pressure 67   Intake/Output:   Intake/Output Summary (Last 24 hours) at 10/01/2023 0823 Last data filed at 10/01/2023 0015 Gross per 24 hour  Intake 810.89 ml  Output 950 ml  Net -139.11 ml     Physical Exam  General:  resting in bed. No distress  HEENT: Normal Neck: supple. JVP 6. Carotids 2+ bilat; no bruits. No lymphadenopathy or thyromegaly appreciated. Cor: Mechanical heart sounds with LVAD hum present. Lungs: CTAB. No wheezing  Abdomen: soft, tender, nondistended. No hepatosplenomegaly. No bruits or masses. Good bowel sounds. Driveline: C/D/I; securement device intact. Dressing around DL site.  Extremities: no cyanosis, clubbing, rash, edema Neuro: alert & orientedx3, cranial nerves grossly intact. moves all 4 extremities w/o difficulty. Affect pleasant   Telemetry   NSR 90s (Personally reviewed)    EKG     No new EKG to review  Labs   Basic Metabolic Panel: Recent Labs  Lab 09/29/23 1300 09/30/23 0221 09/30/23 0905 10/01/23 0545  NA 134* 138 139 138  K 3.3* 3.8 4.5 4.4  CL 100 98 98 101  CO2 23 31 27 29   GLUCOSE 160* 97 85 96  BUN 10 10 9 9   CREATININE 0.83 0.91 0.93 0.90  CALCIUM 8.9 8.9 9.2 9.4  MG  --  1.8 1.8  --     Liver Function Tests: Recent Labs  Lab 09/30/23 0905  AST 18  ALT 13  ALKPHOS 132*  BILITOT 0.3  PROT 6.8  ALBUMIN 3.3*   No results for input(s): "LIPASE", "AMYLASE" in the last 168 hours. No results for input(s): "AMMONIA" in the last 168 hours.  CBC: Recent Labs  Lab 09/29/23 1401 09/30/23 0221 09/30/23 1106 10/01/23 0545  WBC 11.0* 8.6 9.7 7.7  HGB 13.4 13.7 13.9 13.6  HCT 40.1 42.8 43.1 42.8  MCV 85.9 88.2 85.7 86.5  PLT 248 235 270 275    INR: Recent Labs  Lab 09/29/23 1300 09/30/23 0221 09/30/23 1057 10/01/23 0545  INR 2.0* 2.2* 2.3* 1.4*    Other results: EKG:    Imaging   ECHOCARDIOGRAM LIMITED  Result Date: 09/30/2023    ECHOCARDIOGRAM LIMITED REPORT   Patient Name:   Joel York Date of Exam: 09/30/2023 Medical Rec #:  [id number]    Height:       67.0 in Accession #:  [id number]   Weight:       143.3 lb Date of Birth:  [birth year]    BSA:          1.755 m Patient Age:    37 years     BP:           111/91 mmHg Patient Gender: M            HR:           74 bpm. Exam Location:  Inpatient Procedure: Limited Echo, Color Doppler and Cardiac Doppler Indications:    LVAD  History:        Patient has prior history of Echocardiogram examinations, most                 recent 08/06/2023. LVAD and CHF; Acute MI.  Sonographer:    Milbert Coulter Referring Phys: [id number] PETER VANTRIGT IMPRESSIONS  1. Heartmate III     At 5600 RPM, septum is midline, aortic valves opens every other beat, no aortic regurgitation, no mitral regurgitation. Inflow cannula visualized no apparent clot. Left ventricular ejection fraction, by estimation, is <20%.  The left ventricle has severely decreased function.  2. Right ventricular systolic function is moderately reduced.  3. The inferior vena cava is normal in size with <50% respiratory variability, suggesting right atrial pressure of 8 mmHg. FINDINGS  Left Ventricle: Heartmate III At 5600 RPM, septum is midline, aortic valves opens every other beat, no aortic regurgitation, no mitral regurgitation. Inflow cannula visualized no apparent clot. Left ventricular ejection fraction, by estimation, is <20%. The left ventricle has severely decreased function. Right Ventricle: Right ventricular systolic function is moderately reduced. Tricuspid Valve: Tricuspid valve regurgitation is mild. Venous: The inferior vena cava is normal in size with less than 50% respiratory variability, suggesting right atrial pressure of 8 mmHg. Additional Comments: Spectral Doppler performed. Color Doppler performed.  Thomasene Ripple DO Electronically signed by Thomasene Ripple DO Signature Date/Time: 09/30/2023/5:38:43 PM    Final    DG Chest Port 1 View  Result Date: 09/30/2023 CLINICAL DATA:  [id number] LVAD (left ventricular assist device) present [facility]) [id number] EXAM: PORTABLE CHEST 1 VIEW COMPARISON:  08/21/2023 FINDINGS: Previous median sternotomy and LVAD noted. Stable cardiomegaly. Lungs remain clear. No CHF pattern. No large effusion or pneumothorax. Trachea midline. Overall stable exam. No osseous abnormality. IMPRESSION: Stable chest exam. No interval change. Electronically Signed   By: Judie Petit.  Shick M.D.   On: 09/30/2023 13:32   CT CHEST ABDOMEN PELVIS W CONTRAST  Result Date: 09/30/2023 CLINICAL DATA:  Suspected LVAD drive line infection EXAM: CT CHEST, ABDOMEN, AND PELVIS WITH CONTRAST TECHNIQUE: Multidetector CT imaging of the chest, abdomen and pelvis was performed following the standard protocol during bolus administration of intravenous contrast. RADIATION DOSE REDUCTION: This exam was performed according to the departmental dose-optimization  program which includes automated exposure control, adjustment of the mA and/or kV according to patient size and/or use of iterative reconstruction technique. CONTRAST:  75mL OMNIPAQUE IOHEXOL 350 MG/ML SOLN COMPARISON:  CT chest abdomen pelvis, 07/18/2023 FINDINGS: CT CHEST FINDINGS Cardiovascular: Interval placement of LVAD. Outflow cannula appears patent. No pericardial effusion. Mediastinum/Nodes: No enlarged mediastinal, hilar, or axillary lymph nodes. Postoperative soft tissue stranding in the anterior mediastinum (series 3, image 24). Thyroid gland, trachea, and esophagus demonstrate no significant findings. Lungs/Pleura: Diffuse bilateral bronchial wall thickening. Bibasilar scarring or atelectasis. No pleural effusion or pneumothorax. Musculoskeletal: Status post interval median sternotomy. No acute osseous findings. CT ABDOMEN PELVIS FINDINGS Hepatobiliary: No focal liver  abnormality is seen. Status post cholecystectomy. No biliary dilatation. Pancreas: Unremarkable. No pancreatic ductal dilatation or surrounding inflammatory changes. Spleen: Splenomegaly, maximum coronal span 14.8 cm Adrenals/Urinary Tract: Adrenal glands are unremarkable. Kidneys are normal, without renal calculi, solid lesion, or hydronephrosis. Bladder is unremarkable. Stomach/Bowel: Stomach is within normal limits. Appendix appears normal. No evidence of bowel wall thickening, distention, or inflammatory changes. Vascular/Lymphatic: No significant vascular findings are present. No enlarged abdominal or pelvic lymph nodes. Reproductive: No mass or other abnormality. Other: No abdominal wall hernia. Elongated rim enhancing fluid collection at the inferior aspect of the LVAD driveline in the superficial soft tissues of the left hemiabdomen measuring 5.0 x 1.6 x 2.0 cm (series 3, image 79, series 6, image 101). No ascites. Musculoskeletal: No acute osseous findings. IMPRESSION: 1. Interval placement of LVAD. Outflow cannula appears patent.  2. Elongated rim enhancing fluid collection at the inferior aspect of the LVAD driveline in the superficial soft tissues of the left hemiabdomen measuring 5.0 x 1.6 x 2.0 cm, concerning for drive line infection. Note however that the presence or absence of infection within this fluid is not established by imaging. 3. Diffuse bilateral bronchial wall thickening, consistent with nonspecific infectious or inflammatory bronchitis. 4. Splenomegaly. Electronically Signed   By: Jearld Lesch M.D.   On: 09/30/2023 12:07     Medications:     Scheduled Medications:  digoxin  0.125 mg Oral Daily   ivabradine  5 mg Oral BID WC   losartan  25 mg Oral BID   mexiletine  200 mg Oral BID   pantoprazole  40 mg Oral Daily   sertraline  25 mg Oral Daily    Infusions:  DAPTOmycin 500 mg (09/30/23 1832)    PRN Medications: acetaminophen, albuterol, morphine injection, oxyCODONE, traZODone   Patient Profile     Assessment/Plan:   1. Acute Driveline Infection - Prior clinic visit 9/30 DL site was noted by VAD RN to be not completely incorporated. Long discussion about strategies to protect DL and keep it immobilized. Reported mild itching and redness at the time. - Now admitted with driveline infection.  - CT chest/abd/pelvis showed abscess  - Wound Cx + Staph Aureus  - BCx NGTD  - Infectious disease consulted; Started on daptomycin  - OR today for debridement + wound vac  - Oxy added for pain today  2. 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 ? 2/2 PVCs vs genetically mediated +/- hypertension.  - LHC: normal coronaries.  - cMRI:  LV markedly dilated EF 10% RV 17% NICM - Underwent HM-III VAD on 07/28/25. Much improved NYHA I-II  - INR Goal 2.0-3.0. Holding coumadin for OR. INR 1.4 today  - Euvolemic on exam - Continue digoxin 0.125 mg daily - Continue losartan 25 mg BID -  Continue Ivabradine 5 BID - Strict I&O, daily weights - Imperative that he stops smoking so he may be considered for Transplant  3. PVCs - Suppressed with Mexiletine 200 mg BID - likely can stop soon  4. Tobacco use - reports quit smoking 7/24 - Cotinine level at 30.1 09/22/23  5. Hx drug abuse - Continue Suboxone  I reviewed the LVAD parameters from today, and compared the results to the patient's prior recorded data.  No programming changes were made.  The LVAD is functioning within specified parameters.  The patient performs LVAD self-test daily.  LVAD interrogation was negative  for any significant power changes, alarms or PI events/speed drops.  LVAD equipment check completed and is in good working order.  Back-up equipment present.   LVAD education done on emergency procedures and precautions and reviewed exit site care.  Length of Stay: 2  Joel Lis, PA-C 10/01/2023, 8:23 AM  VAD Team --- VAD ISSUES ONLY--- Pager [phone] (7am - 7am)  Advanced Heart Failure Team  Pager [phone] (M-F; 7a - 5p)  Please contact [facility] for night-coverage after hours (5p -7a ) and weekends on [email or web address]  Patient seen with PA, agree with the above note.   No further bleeding from driveline, still with pain.  INR 1.4 off warfarin.  He is on daptomycin.   General: Well appearing this am. NAD.  HEENT: Normal. Neck: Supple, JVP 7-8 cm. Carotids OK.  Cardiac:  Mechanical heart sounds with LVAD hum present.  Lungs:  CTAB, normal effort.  Abdomen:  NT, ND, no HSM. No bruits or masses. +BS  LVAD exit site: Site is dressed.  Extremities:  Warm and dry. No cyanosis, clubbing, rash, or edema.  Neuro:  Alert & oriented x 3. Cranial nerves grossly intact. Moves all 4 extremities w/o difficulty. Affect pleasant    To OR today for debridement of driveline site.  Continue daptomycin for MSSA.   Will need to resume low dose heparin/coumadin when stable post-op.   LVAD parameters stable.    Ongoing need for pain control.   Joel York 10/01/2023 12:23 PM

## 2023-10-01 NOTE — Progress Notes (Signed)
Pre Procedure note for inpatients:   Joel York has been scheduled for Procedure(s): VAD TUNNEL DEBRIDEMENT (N/A) APPLICATION OF WOUND VAC (N/A) today. The various methods of treatment have been discussed with the patient. After consideration of the risks, benefits and treatment options the patient has consented to the planned procedure.   The patient has been seen and labs reviewed. There are no changes in the patient's condition to prevent proceeding with the planned procedure today.  Recent labs:  Lab Results  Component Value Date   WBC 7.7 10/01/2023   HGB 13.6 10/01/2023   HCT 42.8 10/01/2023   PLT 275 10/01/2023   GLUCOSE 96 10/01/2023   CHOL 74 07/15/2023   TRIG 140 07/15/2023   HDL 16 (L) 07/15/2023   LDLCALC 30 07/15/2023   ALT 13 09/30/2023   AST 18 09/30/2023   NA 138 10/01/2023   K 4.4 10/01/2023   CL 101 10/01/2023   CREATININE 0.90 10/01/2023   BUN 9 10/01/2023   CO2 29 10/01/2023   TSH 4.982 (H) 07/14/2023   INR 1.4 (H) 10/01/2023   HGBA1C 6.0 (H) 07/30/2023   MICROALBUR <3.0 (H) 07/14/2023    Lovett Sox, MD 10/01/2023 9:04 AM

## 2023-10-01 NOTE — Progress Notes (Addendum)
PHARMACY - ANTICOAGULATION CONSULT NOTE  Pharmacy Consult for Heparin infusion Indication:  LVAD  Allergies  Allergen Reactions   Other Anaphylaxis    Tree Nuts   Peanuts [Peanut Oil] Anaphylaxis   Chlorhexidine Rash    Patient Measurements: Height: 5\' 7"  (170.2 cm) Weight: 63.1 kg (139 lb 1.8 oz) IBW/kg (Calculated) : 66.1 Heparin Dosing Weight: 63.1 kg  Vital Signs: Temp: 97.7 F (36.5 C) (10/09 1630) Temp Source: Oral (10/09 1630) BP: 113/94 (10/09 1900) Pulse Rate: 87 (10/09 1900)  Labs: Recent Labs    09/30/23 0221 09/30/23 0905 09/30/23 1057 09/30/23 1106 10/01/23 0545  HGB 13.7  --   --  13.9 13.6  HCT 42.8  --   --  43.1 42.8  PLT 235  --   --  270 275  APTT  --   --   --   --  39*  LABPROT 24.6*  --  25.2*  --  17.3*  INR 2.2*  --  2.3*  --  1.4*  CREATININE 0.91 0.93  --   --  0.90  CKTOTAL  --   --   --   --  44*    Estimated Creatinine Clearance: 101.3 mL/min (by C-G formula based on SCr of 0.9 mg/dL).   Medical History: Past Medical History:  Diagnosis Date   Acid reflux    ADHD (attention deficit hyperactivity disorder)    Asthma    Back pain    Seizures (HCC)    Resolved    Medications:  Infusions:    ceFAZolin (ANCEF) IV 2 g (10/01/23 1900)    Assessment: 37 yo male with LVAD, admitted with driveline infection.   INR 2 on admission fell to 1.4 today after FFPx2 10/8  S/p some bleeding around driveline exit site - resolved with sliver nitrate. Holding warfarin with plans for I&D in the OR 10/9.   Now s/p I&D of LVAD driveline w/ wound VAC placement Pharmacy consulted to resume heparin infusion on 10/10 at 07:00 No s/sx of bleeding reported post-op. Wound VAC is operating appropriately.  Goal of Therapy:  Heparin level 0.2-0.3 units/ml Monitor platelets by anticoagulation protocol: Yes   Plan:  Start heparin 500 units/hr, no bolus, at 07:00 on 10/10  Goal heparin level 0.2-0.3, per Dr. Alla German Check heparin level 8 hours  after initiation of heparin infusion Monitor daily CBC, heparin level, and for s/sx of bleeding F/u plans for transition back to warfarin    Wilburn Cornelia, PharmD, BCPS Clinical Pharmacist 10/01/2023 8:34 PM   Please refer to AMION for pharmacy phone number

## 2023-10-01 NOTE — Plan of Care (Signed)
  Problem: Education: Goal: Understanding of CV disease, CV risk reduction, and recovery process will improve Outcome: Progressing Goal: Individualized Educational Video(s) Outcome: Progressing   Problem: Activity: Goal: Ability to return to baseline activity level will improve Outcome: Progressing   Problem: Cardiovascular: Goal: Ability to achieve and maintain adequate cardiovascular perfusion will improve Outcome: Progressing Goal: Vascular access site(s) Level 0-1 will be maintained Outcome: Progressing   Problem: Health Behavior/Discharge Planning: Goal: Ability to safely manage health-related needs after discharge will improve Outcome: Progressing   Problem: Education: Goal: Patient will understand all VAD equipment and how it functions Outcome: Progressing Goal: Patient will be able to verbalize current INR target range and antiplatelet therapy for discharge home Outcome: Progressing   Problem: Cardiac: Goal: LVAD will function as expected and patient will experience no clinical alarms Outcome: Progressing   Problem: Education: Goal: Knowledge of General Education information will improve Description: Including pain rating scale, medication(s)/side effects and non-pharmacologic comfort measures Outcome: Progressing   Problem: Health Behavior/Discharge Planning: Goal: Ability to manage health-related needs will improve Outcome: Progressing   Problem: Clinical Measurements: Goal: Ability to maintain clinical measurements within normal limits will improve Outcome: Progressing Goal: Will remain free from infection Outcome: Progressing Goal: Diagnostic test results will improve Outcome: Progressing Goal: Respiratory complications will improve Outcome: Progressing Goal: Cardiovascular complication will be avoided Outcome: Progressing   Problem: Activity: Goal: Risk for activity intolerance will decrease Outcome: Progressing   Problem: Nutrition: Goal: Adequate  nutrition will be maintained Outcome: Progressing   Problem: Coping: Goal: Level of anxiety will decrease Outcome: Progressing   Problem: Elimination: Goal: Will not experience complications related to bowel motility Outcome: Progressing Goal: Will not experience complications related to urinary retention Outcome: Progressing   Problem: Pain Managment: Goal: General experience of comfort will improve Outcome: Progressing   Problem: Safety: Goal: Ability to remain free from injury will improve Outcome: Progressing   Problem: Skin Integrity: Goal: Risk for impaired skin integrity will decrease Outcome: Progressing

## 2023-10-01 NOTE — Progress Notes (Signed)
[facility]  Date of Admission:  09/29/2023      Total days of antibiotics 2   Daptomycin           ASSESSMENT: Ha Placeres is a 37 y.o. male s/p HM3 destination LVAD placed 2 months ago here with    LVAD Driveline Exitsite Infection -  -drainage cultures growing out staphylococcus aureus - MSSA on cultures -will change to cefazolin for better abx mechanism of action  -CT scan with organized abscess 5.0 x 1.6 x 2.0 cm  -surgery 10.9 with Dr. Donata Clay with plans for deeper cultures there.      Irritant Dermatitis -  -may need some topical steroids for that patch of dermatitis under where anchor should sit. He has had a hard time tolerating these products on his skin and keeping driveline immobilized    PLAN: Change to cefazolin IV  Follow pending cultures from OR today 10/9 FU blood cultures to maturity (negative at the time of this assessment)    Principal Problem:   Infection associated with driveline of left ventricular assist device (LVAD) (HCC) Active Problems:   Staphylococcus aureus infection    [MAR Hold] digoxin  0.125 mg Oral Daily   [MAR Hold] ivabradine  5 mg Oral BID WC   [MAR Hold] losartan  25 mg Oral BID   [MAR Hold] mexiletine  200 mg Oral BID   [MAR Hold] pantoprazole  40 mg Oral Daily   [MAR Hold] sertraline  25 mg Oral Daily    SUBJECTIVE: Doing OK - no trouble with antibiotics. Moved to ICU for bleeding from VAD power cord tunnel.   Review of Systems: Review of Systems  Constitutional:  Negative for chills and fever.  Gastrointestinal:  Positive for abdominal pain. Negative for diarrhea, nausea and vomiting.  Skin:  Negative for itching and rash.    Allergies  Allergen Reactions   Other Anaphylaxis    Tree Nuts   Peanuts [Peanut Oil] Anaphylaxis   Chlorhexidine Rash    OBJECTIVE: Vitals:   10/01/23 1000 10/01/23 1100 10/01/23 1130 10/01/23 1212  BP: 104/79   111/88  Pulse: 82 64  88  Resp:  14 19  18   Temp:   97.7 F (36.5 C) 97.6 F (36.4 C)  TempSrc:   Oral Oral  SpO2: 96% 97%  95%  Weight:    63.1 kg  Height:    5\' 7"  (1.702 m)   Body mass index is 21.79 kg/m.  Physical Exam Constitutional:      Appearance: Normal appearance. He is not ill-appearing.  HENT:     Head: Normocephalic.     Mouth/Throat:     Mouth: Mucous membranes are moist.     Pharynx: Oropharynx is clear.  Eyes:     General: No scleral icterus. Cardiovascular:     Rate and Rhythm: Normal rate and regular rhythm.  Pulmonary:     Effort: Pulmonary effort is normal.  Abdominal:     Comments: Abdomen clean/dry dressings in place   Musculoskeletal:        General: Normal range of motion.     Cervical back: Normal range of motion.  Skin:    Coloration: Skin is not jaundiced or pale.  Neurological:     Mental Status: He is alert and oriented to person, place, and time.  Psychiatric:        Mood and Affect: Mood normal.  Judgment: Judgment normal.     Lab Results Lab Results  Component Value Date   WBC 7.7 10/01/2023   HGB 13.6 10/01/2023   HCT 42.8 10/01/2023   MCV 86.5 10/01/2023   PLT 275 10/01/2023    Lab Results  Component Value Date   CREATININE 0.90 10/01/2023   BUN 9 10/01/2023   NA 138 10/01/2023   K 4.4 10/01/2023   CL 101 10/01/2023   CO2 29 10/01/2023    Lab Results  Component Value Date   ALT 13 09/30/2023   AST 18 09/30/2023   ALKPHOS 132 (H) 09/30/2023   BILITOT 0.3 09/30/2023     Microbiology: Recent Results (from the past 240 hour(s))  Aerobic Culture w Gram Stain (superficial specimen)     Status: None (Preliminary result)   Collection Time: 09/29/23  1:42 PM   Specimen: Abdomen  Result Value Ref Range Status   Specimen Description ABDOMEN  Final   Special Requests DRIVELINE  Final   Gram Stain   Final    RARE WBC SEEN FEW GRAM POSITIVE COCCI Performed at [facility], [address] [place], [place] [address]    Culture MODERATE  STAPHYLOCOCCUS AUREUS  Final   Report Status PENDING  Incomplete   Organism ID, Bacteria STAPHYLOCOCCUS AUREUS  Final      Susceptibility   Staphylococcus aureus - MIC*    CIPROFLOXACIN <=0.5 SENSITIVE Sensitive     ERYTHROMYCIN >=8 RESISTANT Resistant     GENTAMICIN <=0.5 SENSITIVE Sensitive     OXACILLIN 0.5 SENSITIVE Sensitive     TETRACYCLINE <=1 SENSITIVE Sensitive     VANCOMYCIN 1 SENSITIVE Sensitive     TRIMETH/SULFA <=10 SENSITIVE Sensitive     CLINDAMYCIN <=0.25 SENSITIVE Sensitive     RIFAMPIN <=0.5 SENSITIVE Sensitive     Inducible Clindamycin NEGATIVE Sensitive     LINEZOLID 2 SENSITIVE Sensitive     * MODERATE STAPHYLOCOCCUS AUREUS  Blood culture (routine single)     Status: None (Preliminary result)   Collection Time: 09/29/23  2:00 PM   Specimen: BLOOD RIGHT FOREARM  Result Value Ref Range Status   Specimen Description BLOOD RIGHT FOREARM  Final   Special Requests   Final    BOTTLES DRAWN AEROBIC AND ANAEROBIC Blood Culture adequate volume   Culture   Final    NO GROWTH 2 DAYS Performed at [facility], [address], [place], [place] [address]    Report Status PENDING  Incomplete  Culture, blood (Routine X 2) w Reflex to ID Panel     Status: None (Preliminary result)   Collection Time: 09/29/23  2:01 PM   Specimen: BLOOD  Result Value Ref Range Status   Specimen Description BLOOD LEFT ANTECUBITAL  Final   Special Requests   Final    BOTTLES DRAWN AEROBIC AND ANAEROBIC Blood Culture adequate volume   Culture   Final    NO GROWTH 2 DAYS Performed at [facility], [address], [place], [place] [address]    Report Status PENDING  Incomplete  MRSA Next Gen by PCR, Nasal     Status: None   Collection Time: 09/29/23 10:16 PM   Specimen: Nasal Mucosa; Nasal Swab  Result Value Ref Range Status   MRSA by PCR Next Gen NOT DETECTED NOT DETECTED Final    Comment: (NOTE) The GeneXpert MRSA Assay (FDA approved for NASAL specimens only), is one  component of a comprehensive MRSA colonization surveillance program. It is not  intended to diagnose MRSA infection nor to guide or monitor treatment for MRSA infections. Test performance is not FDA approved in patients less than 76 years old. Performed at [facility], [address], [place], [place] [address]     Rexene Alberts, MSN, NP-C [facility]  [email or web address] Pager: [phone] Office: [phone] [facility] Main Line: [phone] *Secure Chat Communication Welcome

## 2023-10-01 NOTE — Transfer of Care (Signed)
Immediate Anesthesia Transfer of Care Note  Patient: Joel York  Procedure(s) Performed: VAD TUNNEL DEBRIDEMENT APPLICATION OF WOUND VAC  Patient Location: PACU  Anesthesia Type:General  Level of Consciousness: awake, alert , and oriented  Airway & Oxygen Therapy: Patient Spontanous Breathing  Post-op Assessment: Report given to RN, Post -op Vital signs reviewed and stable, and Patient moving all extremities X 4  Post vital signs: Reviewed and stable  Last Vitals:  Vitals Value Taken Time  BP 107/88 10/01/23 1545  Temp    Pulse 95 10/01/23 1546  Resp 22 10/01/23 1546  SpO2 98 % 10/01/23 1546  Vitals shown include unfiled device data.  Last Pain:  Vitals:   10/01/23 1219  TempSrc:   PainSc: 5       Patients Stated Pain Goal: 0 (10/01/23 0800)  Complications: No notable events documented.

## 2023-10-01 NOTE — Progress Notes (Signed)
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  Pt lying in bed asleep on my arrival. Plan for OR wound debridement today with possible wound vac placement with Dr. Maren Beach. No ongoing bleeding reported from drive line exit site. Mild shadowing noted on previous dressing.   Vital signs: Temp: 98.2 HR: 89 Doppler Pressure: 80 Auto BP: 77/59 (59) O2 Sat: 99% on RA Wt: 143.3>139.1 lbs    LVAD interrogation reveals:  Speed: 5600 Flow: 4.3 Power: 4.4 w PI: 4.3  Alarms: none Events: 15 PI events today Hematocrit: 42  Fixed speed: 5600 Low speed limit: 5300  Drive Line: Existing dressing dry and intact. Mild shadowing noted.  Pt will go to OR this afternoon for wound debridement.   Labs:  LDH trend: 129>142  INR trend: 2.2>1.4  WBC trend: 8.6>7.7  Anticoagulation Plan: -INR Goal: 2-2.5 -ASA Dose: off  Blood Products:  09/30/23: 2 FFP   Infection:  09/29/23>>blood cxs x 2>> no growth x 1 days 09/29/23>>driveline cx>>MODERATE STAPHYLOCOCCUS AUREUS susp pending   Adverse Events on VAD: -  Plan/Recommendations:  1. Please page VAD coordinator for any alarms or VAD equipment issues. 2. VAD coordinator will accompany pt to OR tomorrow  Simmie Davies RN,BSN VAD Coordinator  Office: [phone]  24/7 Pager: [phone]

## 2023-10-01 NOTE — Anesthesia Preprocedure Evaluation (Signed)
Anesthesia Evaluation  Patient identified by MRN, date of birth, ID band Patient awake    Reviewed: Allergy & Precautions, NPO status , Patient's Chart, lab work & pertinent test results, reviewed documented beta blocker date and time   History of Anesthesia Complications Negative for: history of anesthetic complications  Airway Mallampati: III  TM Distance: >3 FB Neck ROM: Full    Dental no notable dental hx.    Pulmonary neg shortness of breath, asthma , neg COPD, Current Smoker and Patient abstained from smoking., neg PE   breath sounds clear to auscultation       Cardiovascular Exercise Tolerance: Poor +CHF  (-) Valvular Problems/Murmurs Rhythm:Regular Rate:Normal  S/p LVAD, with moderate RV dysfunction. Stable LVAD settings.    Neuro/Psych Seizures -, Well Controlled,  PSYCHIATRIC DISORDERS Anxiety        GI/Hepatic ,GERD  ,,(+) neg Cirrhosis        Endo/Other  neg diabetes    Renal/GU Renal disease     Musculoskeletal   Abdominal   Peds  Hematology   Anesthesia Other Findings   Reproductive/Obstetrics                              Anesthesia Physical Anesthesia Plan  ASA: 4  Anesthesia Plan: General   Post-op Pain Management:    Induction: Intravenous  PONV Risk Score and Plan: 1 and Ondansetron  Airway Management Planned: LMA  Additional Equipment:   Intra-op Plan:   Post-operative Plan: Extubation in OR  Informed Consent: I have reviewed the patients History and Physical, chart, labs and discussed the procedure including the risks, benefits and alternatives for the proposed anesthesia with the patient or authorized representative who has indicated his/her understanding and acceptance.     Dental advisory given  Plan Discussed with: CRNA  Anesthesia Plan Comments: (Possible art line)         Anesthesia Quick Evaluation

## 2023-10-01 NOTE — Progress Notes (Signed)
Patient received Oxycodone IR 5 mg for pain on the surgical site 5/10, per Dr. Ace Gins verbal order

## 2023-10-01 NOTE — Anesthesia Procedure Notes (Signed)
Procedure Name: Intubation Date/Time: 10/01/2023 2:27 PM  Performed by: Cy Blamer, CRNAPre-anesthesia Checklist: Patient identified, Emergency Drugs available, Suction available and Patient being monitored Patient Re-evaluated:Patient Re-evaluated prior to induction Oxygen Delivery Method: Circle system utilized Preoxygenation: Pre-oxygenation with 100% oxygen Induction Type: IV induction Ventilation: Mask ventilation without difficulty Laryngoscope Size: Mac and 4 Grade View: Grade I Tube type: Oral Tube size: 8.0 mm Number of attempts: 1 Airway Equipment and Method: Stylet Placement Confirmation: ETT inserted through vocal cords under direct vision, positive ETCO2 and breath sounds checked- equal and bilateral Secured at: 23 cm Tube secured with: Tape Dental Injury: Teeth and Oropharynx as per pre-operative assessment

## 2023-10-01 NOTE — Brief Op Note (Signed)
10/01/2023  3:34 PM  PATIENT:  Joel York  37 y.o. male  PRE-OPERATIVE DIAGNOSIS:  DRIVELINE INFECTION  POST-OPERATIVE DIAGNOSIS:  DRIVELINE INFECTION  PROCEDURE:  Procedure(s): VAD TUNNEL DEBRIDEMENT (N/A) APPLICATION OF WOUND VAC (N/A)  SURGEON:  Surgeons and Role:    Lovett Sox, MD - Primary  PHYSICIAN ASSISTANT:   ASSISTANTS: na   ANESTHESIA:   general  EBL:  40 cc  BLOOD ADMINISTERED:none  DRAINS: none   LOCAL MEDICATIONS USED:  NONE  SPECIMEN:  Scraping  DISPOSITION OF SPECIMEN:   microbiology  COUNTS:   YES TOURNIQUET:  * No tourniquets in log *  DICTATION: .Dragon Dictation  PLAN OF CARE:  return to 2H-06  PATIENT DISPOSITION:  ICU - extubated and stable.   Delay start of Pharmacological VTE agent (>24hrs) due to surgical blood loss or risk of bleeding: yes Hold iv heparin until 0700 10-02-23

## 2023-10-01 NOTE — Progress Notes (Signed)
VAD Coordinator Procedure Note:   VAD Coordinator met patient in pre-op. Pt undergoing drive line debridement with wound vac placement per Dr. Maren Beach. Hemodynamics and VAD parameters monitored by myself and anesthesia throughout the procedure. Blood pressures were obtained with automatic cuff on left arm.    Time: Doppler Auto  BP Flow PI Power Speed  Pre-procedure:  1405  99/88(94) 4.3 4.3 4.3 5600   1420  92/61(72) 4.3 4.1 4.3 5600           Sedation Induction: 1426  95/79(85) 4.5 3.3 4.3 5600   1430  99/61(71) 4.7 3.0 4.4 5600   1445  116/85(95) 4.3 5.4 4.6 5600   1500  119/93(102) 4.0 6.0 4.4 5600   1515  99/87(93) 4.4 3.9 4.4 5600   1530  86/70(76) 3.9 6.2 4.5 5600  Recovery Area: 1545  107/88(96) 4.1 4.9 4.5 5600   1600  109/95(101) 4.2 4.0 4.3 5600   1615  114/94(101) 4.2 4.0 4.4 5600    Patient tolerated the procedure well. VAD Coordinator accompanied and remained with patient in recovery area. Aerobic and anaerobic cultures of wound obtained in OR today.  Hold IV Heparin tonight per Dr.Vantrigt. [facility] made aware.  Patient Disposition: 2H06   Simmie Davies RN, BSN VAD Coordinator 24/7 Pager [phone]

## 2023-10-01 NOTE — TOC Initial Note (Signed)
Transition of Care [facility]) - Initial/Assessment Note    Patient Details  Name: Joel York MRN: [id number] Date of Birth: December 29, 1985  [facility] CM/SW Contact:    Elliot Cousin, RN Phone Number: [phone] 10/01/2023, 6:10 PM  Clinical Narrative:  HF TOC CM spoke to pt and states wife is at home. Pt may need Home IV abx and wound vac. Contacted [facility] rep, Pam. Will continue to follow for dc needs.                  Expected Discharge Plan: Home w Home Health Services Barriers to Discharge: Continued Medical Work up   Patient Goals and CMS Choice [name] CMS Medicare.gov Compare Post Acute Care list provided to:: [name]      Expected Discharge Plan and Services   Discharge Planning Services: CM Consult Post Acute Care Choice: Home Health                                        Prior Living Arrangements/Services   Lives with:: Spouse   Do you feel safe going back to the place where you live?: Yes      Need for Family Participation in Patient Care: No (Comment) Care giver support system in place?: Yes (comment) Current home services: DME (scale) Criminal Activity/Legal Involvement Pertinent to Current Situation/Hospitalization: No - Comment as needed  Activities of Daily Living   ADL Screening (condition at time of admission) Independently performs ADLs?: Yes (appropriate for developmental age) Is the patient deaf or have difficulty hearing?: No Does the patient have difficulty seeing, even when wearing glasses/contacts?: No Does the patient have difficulty concentrating, remembering, or making decisions?: No  Permission Sought/Granted Permission sought to share information with : Case Manager, Family Supports, PCP Permission granted to share information with : Yes, Verbal Permission  Granted  Share Information with NAME: Joel York  Permission granted to share info w AGENCY: Home Health  Permission granted to share info w Relationship: wife  Permission granted to share info w Contact Information: [phone]  Emotional Assessment Appearance:: Appears stated age Attitude/Demeanor/Rapport: Engaged Affect (typically observed): Accepting Orientation: : Oriented to Self, Oriented to Place, Oriented to  Time, Oriented to Situation   Psych Involvement: No (comment)  Admission diagnosis:  Infection associated with driveline of left ventricular assist device (LVAD) (HCC) [K44.7XXA] Patient Active Problem List   Diagnosis Date Noted   Staphylococcus aureus infection 09/30/2023   Infection associated with driveline of left ventricular assist device (LVAD) (HCC) 09/29/2023   AKI (acute kidney injury) (HCC) 07/14/2023   Acute on chronic systolic CHF (congestive heart failure) (HCC) 07/14/2023   Elevated troponin 07/14/2023   Sinus tachycardia 07/14/2023   PVC (premature ventricular contraction) 07/14/2023   Acute decompensated heart failure (HCC) 07/14/2023   History of asthma 07/14/2023   Myocardial injury 07/14/2023   Polycythemia 07/14/2023   Chest pain 07/14/2023   Transaminitis 07/14/2023   Elevated bilirubin 07/14/2023   CHF exacerbation (HCC) 07/14/2023   ADD (attention deficit disorder) 08/29/2015   Visit for preventive health examination 02/21/2015   Mild persistent asthma 02/14/2015   Attention deficit disorder 01/24/2011   SEIZURE DISORDER 09/14/2010   ANXIETY STATE, UNSPECIFIED 11/06/2009   BICEPS TENDINITIS, LEFT 11/22/2008  ASTHMA 09/26/2008   PCP:  Oneita Hurt, No Pharmacy:   [facility] [phone] - [place]in[place] Otto, [place] - [address] AT [facility] [address] [place] [address] Phone: [phone] Fax: [phone]  [facility] [address] [place] [address] Phone:  [phone] Fax: [phone]     Social Determinants of Health (SDOH) Social History: SDOH Screenings   Food Insecurity: No Food Insecurity (09/29/2023)  Housing: Low Risk  (09/29/2023)  Transportation Needs: No Transportation Needs (09/29/2023)  Utilities: Not At Risk (09/29/2023)  Alcohol Screen: Low Risk  (07/14/2023)  Financial Resource Strain: Medium Risk (08/12/2023)  Physical Activity: Sufficiently Active (07/14/2023)  Stress: Stress Concern Present (07/14/2023)  Tobacco Use: High Risk (10/01/2023)   SDOH Interventions:     Readmission Risk Interventions     No data to display

## 2023-10-02 ENCOUNTER — Encounter (HOSPITAL_COMMUNITY): Payer: Self-pay | Admitting: Cardiothoracic Surgery

## 2023-10-02 DIAGNOSIS — T827XXA Infection and inflammatory reaction due to other cardiac and vascular devices, implants and grafts, initial encounter: Secondary | ICD-10-CM | POA: Diagnosis not present

## 2023-10-02 DIAGNOSIS — A4901 Methicillin susceptible Staphylococcus aureus infection, unspecified site: Secondary | ICD-10-CM | POA: Diagnosis not present

## 2023-10-02 LAB — URINALYSIS, ROUTINE W REFLEX MICROSCOPIC
Bacteria, UA: NONE SEEN
Bilirubin Urine: NEGATIVE
Glucose, UA: NEGATIVE mg/dL
Hgb urine dipstick: NEGATIVE
Ketones, ur: NEGATIVE mg/dL
Leukocytes,Ua: NEGATIVE
Nitrite: NEGATIVE
Protein, ur: NEGATIVE mg/dL
Specific Gravity, Urine: 1.029 (ref 1.005–1.030)
pH: 5 (ref 5.0–8.0)

## 2023-10-02 LAB — AEROBIC CULTURE W GRAM STAIN (SUPERFICIAL SPECIMEN)

## 2023-10-02 LAB — PREPARE RBC (CROSSMATCH)

## 2023-10-02 LAB — CBC
HCT: 39.7 % (ref 39.0–52.0)
Hemoglobin: 12.8 g/dL — ABNORMAL LOW (ref 13.0–17.0)
MCH: 28.1 pg (ref 26.0–34.0)
MCHC: 32.2 g/dL (ref 30.0–36.0)
MCV: 87.3 fL (ref 80.0–100.0)
Platelets: 265 10*3/uL (ref 150–400)
RBC: 4.55 MIL/uL (ref 4.22–5.81)
RDW: 14.1 % (ref 11.5–15.5)
WBC: 7.3 10*3/uL (ref 4.0–10.5)
nRBC: 0 % (ref 0.0–0.2)

## 2023-10-02 LAB — BASIC METABOLIC PANEL
Anion gap: 10 (ref 5–15)
BUN: 17 mg/dL (ref 6–20)
CO2: 29 mmol/L (ref 22–32)
Calcium: 9.4 mg/dL (ref 8.9–10.3)
Chloride: 99 mmol/L (ref 98–111)
Creatinine, Ser: 0.96 mg/dL (ref 0.61–1.24)
GFR, Estimated: >60 mL/min (ref >60)
Glucose, Bld: 99 mg/dL (ref 70–99)
Potassium: 4.1 mmol/L (ref 3.5–5.1)
Sodium: 138 mmol/L (ref 135–145)

## 2023-10-02 LAB — PROTIME-INR
INR: 1.4 — ABNORMAL HIGH (ref 0.8–1.2)
Prothrombin Time: 17 s — ABNORMAL HIGH (ref 11.4–15.2)

## 2023-10-02 LAB — LACTATE DEHYDROGENASE: LDH: 136 U/L (ref 98–192)

## 2023-10-02 LAB — HEPARIN LEVEL (UNFRACTIONATED): Heparin Unfractionated: <0.1 [IU]/mL — ABNORMAL LOW (ref 0.30–0.70)

## 2023-10-02 MED ORDER — CEFAZOLIN SODIUM-DEXTROSE 2-4 GM/100ML-% IV SOLN
2.0000 g | INTRAVENOUS | Status: AC
  Administered 2023-10-03: 2 g via INTRAVENOUS
  Filled 2023-10-02: qty 100

## 2023-10-02 MED ORDER — SENNOSIDES-DOCUSATE SODIUM 8.6-50 MG PO TABS
1.0000 | ORAL_TABLET | Freq: Every day | ORAL | Status: DC
  Administered 2023-10-03 – 2023-10-05 (×2): 1 via ORAL
  Filled 2023-10-02 (×2): qty 1

## 2023-10-02 MED ORDER — POLYETHYLENE GLYCOL 3350 17 G PO PACK
17.0000 g | PACK | Freq: Every day | ORAL | Status: DC
  Administered 2023-10-04: 17 g via ORAL
  Filled 2023-10-02 (×6): qty 1

## 2023-10-02 MED ORDER — SORBITOL 70 % SOLN
60.0000 mL | Freq: Once | Status: DC
  Filled 2023-10-02: qty 60

## 2023-10-02 NOTE — Progress Notes (Signed)
PHARMACY - ANTICOAGULATION CONSULT NOTE  Pharmacy Consult for Heparin infusion Indication:  LVAD  Allergies  Allergen Reactions   Other Anaphylaxis    Tree Nuts   Peanuts [Peanut Oil] Anaphylaxis   Chlorhexidine Rash    Patient Measurements: Height: 5\' 7"  (170.2 cm) Weight: 63.3 kg (139 lb 8.8 oz) IBW/kg (Calculated) : 66.1 Heparin Dosing Weight: 63.1 kg  Vital Signs: Temp: 98.3 F (36.8 C) (10/10 0815) Temp Source: Oral (10/10 0815) BP: 84/72 (10/10 1300) Pulse Rate: 80 (10/10 0700)  Labs: Recent Labs    09/30/23 0905 09/30/23 1057 09/30/23 1106 10/01/23 0545 10/02/23 0237 10/02/23 1340  HGB  --   --  13.9 13.6 12.8*  --   HCT  --   --  43.1 42.8 39.7  --   PLT  --   --  270 275 265  --   APTT  --   --   --  39*  --   --   LABPROT  --  25.2*  --  17.3* 17.0*  --   INR  --  2.3*  --  1.4* 1.4*  --   HEPARINUNFRC  --   --   --   --   --  <0.10*  CREATININE 0.93  --   --  0.90 0.96  --   CKTOTAL  --   --   --  44*  --   --     Estimated Creatinine Clearance: 95.2 mL/min (by C-G formula based on SCr of 0.96 mg/dL).   Medical History: Past Medical History:  Diagnosis Date   Acid reflux    ADHD (attention deficit hyperactivity disorder)    Asthma    Back pain    Seizures (HCC)    Resolved    Medications:  Infusions:    ceFAZolin (ANCEF) IV 200 mL/hr at 10/02/23 1300    ceFAZolin (ANCEF) IV     heparin 500 Units/hr (10/02/23 1300)    Assessment: 37 yo male with LVAD, admitted with driveline infection. On warfarin PTA - PTA regimen is 2mg  daily except 3 mg MF. INR on admit therapeutic at 2.  Now INR 1.4 after FFP. Given INR<1.8, starting on heparin infusion at 500 units/hr. Hgb 12.8, plt 265. LDH 136. Had evidence of blood retained in sponge of wound VAC upon CTVS exam. No infusion issues. S/p I&D 10/9 - per notes plan for 10/11. No titration in rate at this time per MD.   Goal of Therapy:  Heparin level 0.2-0.3 units/ml Monitor platelets by  anticoagulation protocol: Yes   Plan:  Continue heparin 500 units/hr Monitor daily CBC, heparin level, and for s/sx of bleeding F/u plans for transition back to warfarin once cleared by CTVS  Thank you for allowing pharmacy to participate in this patient's care,  Sherron Monday, PharmD, BCCCP Clinical Pharmacist  Phone: [phone] 10/02/2023 2:33 PM  Please check AMION for all [facility] Pharmacy phone numbers After 10:00 PM, call Main Pharmacy [phone]

## 2023-10-02 NOTE — Progress Notes (Signed)
1 Day Post-Op Procedure(s) (LRB): VAD TUNNEL DEBRIDEMENT (N/A) APPLICATION OF WOUND VAC (N/A) Subjective: Patient having significant pain at the surgical site from VAD tunnel debridement.  Patient had significant postoperative oozing from the abdominal wall debridement and there appears to be some obstruction in the wound VAC sponge from blood clot formation.  Operative cultures are consistent with MSSA and antibiotics have been changed accordingly. Low-dose heparin 500 units/h has been started and will not be increased due to bleeding from the wound.  I have recommended changing out the wound VAC sponge and circuit and washing out the wound in the operating room tomorrow under anesthesia/monitored sedation..  Patient needs better nutritional intake, continued mobilization, and we will try to reduce the surgical pain as safely as possible.  Objective: Vital signs in last 24 hours: Temp:  [97.7 F (36.5 C)-98.4 F (36.9 C)] 98.3 F (36.8 C) (10/10 0815) Pulse Rate:  [73-163] 80 (10/10 0700) Cardiac Rhythm: Normal sinus rhythm (10/10 0800) Resp:  [8-19] 10 (10/10 1200) BP: (81-121)/(62-103) 99/81 (10/10 1200) SpO2:  [82 %-100 %] 82 % (10/10 0700) Weight:  [63.3 kg] 63.3 kg (10/10 0530)  Hemodynamic parameters for last 24 hours:    Intake/Output from previous day: 10/09 0701 - 10/10 0700 In: 766.2 [P.O.:250; I.V.:100; IV Piggyback:416.2] Out: 1000 [Urine:800; Drains:200] Intake/Output this shift: Total I/O In: 345.7 [P.O.:240; I.V.:21.8; IV Piggyback:83.9] Out: -   Exam  Patient supine in bed complaining of incisional pain Lungs clear Normal VAD hum, normal sinus rhythm Wound VAC sponge in  abdominal wound compressed but with evidence of blood retained in the sponge which has reduced efficiency of the system.  Lab Results: Recent Labs    10/01/23 0545 10/02/23 0237  WBC 7.7 7.3  HGB 13.6 12.8*  HCT 42.8 39.7  PLT 275 265   BMET:  Recent Labs    10/01/23 0545  10/02/23 0237  NA 138 138  K 4.4 4.1  CL 101 99  CO2 29 29  GLUCOSE 96 99  BUN 9 17  CREATININE 0.90 0.96  CALCIUM 9.4 9.4    PT/INR:  Recent Labs    10/02/23 0237  LABPROT 17.0*  INR 1.4*   ABG    Component Value Date/Time   PHART 7.510 (H) 08/03/2023 0445   HCO3 27.3 08/03/2023 0445   TCO2 28 08/03/2023 0445   ACIDBASEDEF 2.0 07/31/2023 1704   O2SAT 68.1 08/08/2023 0535   CBG (last 3)  No results for input(s): "GLUCAP" in the last 72 hours.  Assessment/Plan: S/P Procedure(s) (LRB): VAD TUNNEL DEBRIDEMENT (N/A) APPLICATION OF WOUND VAC (N/A) Plan return to the OR for wound washout and wound VAC change, tomorrow continue low-dose heparin 500 units/h .   LOS: 3 days    Joel York 10/02/2023

## 2023-10-02 NOTE — Progress Notes (Signed)
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  Pt lying in bed on my arrival. Pt had driveline debride yesterday with wound vac placement. Pt is requiring IV Dilaudid for pain control.  Pt has been having leakage alarms on wound vac. Pt posted for OR wound vac change tomorrow afternoon.  Vital signs: Temp: 98.3 HR: 89 Doppler Pressure: 80 Auto BP: 94/79 (86) O2 Sat: 99% on RA Wt: 143.3>139.1>139.5 lbs    LVAD interrogation reveals:  Speed: 5600 Flow: 4.3 Power: 4.3 w PI: 4.3  Alarms: none Events: 5 PI events today Hematocrit: 39  Fixed speed: 5600 Low speed limit: 5300  Drive Line: wound vac at -75. Nurse states that it was alarming leakage last night so the suction was decreased from -125 to -75. Suction increased to -100. No leak alarms, good seal. Left at -100. Anchor secure. Nurse called an hour later to let us know pt was having leakage alarms and suction turned to -75.  Labs:  LDH trend: 129>142>136  INR trend: 2.2>1.4  WBC trend: 8.6>7.7>7.3  Anticoagulation Plan: -INR Goal: 2-2.5 -ASA Dose: off  Blood Products:  09/30/23: 2 FFP   Infection:  09/29/23>>blood cxs x 2>> no growth x 1 days 09/29/23>>driveline cx>>MODERATE STAPHYLOCOCCUS AUREUS 10/01/23>>driveline cx OR>>   Adverse Events on VAD: -  Plan/Recommendations:  1. Please page VAD coordinator for any alarms or VAD equipment issues. 2. VAD coordinator will accompany pt to OR tomorrow  Carlton Adam RN,BSN VAD Coordinator  Office: [phone]  24/7 Pager: [phone]

## 2023-10-02 NOTE — Plan of Care (Signed)
  Problem: Education: Goal: Understanding of CV disease, CV risk reduction, and recovery process will improve Outcome: Progressing   Problem: Activity: Goal: Ability to return to baseline activity level will improve Outcome: Progressing   Problem: Cardiovascular: Goal: Ability to achieve and maintain adequate cardiovascular perfusion will improve Outcome: Progressing   Problem: Clinical Measurements: Goal: Ability to maintain clinical measurements within normal limits will improve Outcome: Progressing Goal: Diagnostic test results will improve Outcome: Progressing Goal: Cardiovascular complication will be avoided Outcome: Progressing

## 2023-10-02 NOTE — Progress Notes (Signed)
Subjective: No new complaints   Antibiotics:  Anti-infectives (From admission, onward)    Start     Dose/Rate Route Frequency Ordered Stop   10/02/23 1405  ceFAZolin (ANCEF) IVPB 2g/100 mL premix        2 g 200 mL/hr over 30 Minutes Intravenous 30 min pre-op 10/02/23 1405     10/01/23 1500  ceFAZolin (ANCEF) IVPB 2g/100 mL premix        2 g 200 mL/hr over 30 Minutes Intravenous Every 8 hours 10/01/23 1455     09/30/23 1420  ceFAZolin (ANCEF) IVPB 2g/100 mL premix  Status:  Discontinued        2 g 200 mL/hr over 30 Minutes Intravenous 30 min pre-op 09/30/23 1420 09/30/23 1425   09/30/23 0800  DAPTOmycin (CUBICIN) IVPB 500 mg/44mL premix  Status:  Discontinued        8 mg/kg  68.3 kg 100 mL/hr over 30 Minutes Intravenous Daily 09/29/23 1938 09/29/23 1939   09/29/23 2300  DAPTOmycin (CUBICIN) IVPB 500 mg/32mL premix  Status:  Discontinued        8 mg/kg  68.3 kg 100 mL/hr over 30 Minutes Intravenous Daily 09/29/23 1939 10/01/23 1500   09/29/23 1645  cefTRIAXone (ROCEPHIN) 1 g in sodium chloride 0.9 % 100 mL IVPB  Status:  Discontinued        1 g 200 mL/hr over 30 Minutes Intravenous Every 24 hours 09/29/23 1553 09/29/23 1840       Medications: Scheduled Meds:  digoxin  0.125 mg Oral Daily   feeding supplement  237 mL Oral TID WC   ivabradine  5 mg Oral BID WC   losartan  25 mg Oral BID   mexiletine  200 mg Oral BID   pantoprazole  40 mg Oral Daily   polyethylene glycol  17 g Oral Daily   senna-docusate  1 tablet Oral QHS   sertraline  25 mg Oral Daily   sorbitol  60 mL Oral Once   zinc sulfate  220 mg Oral Daily   Continuous Infusions:   ceFAZolin (ANCEF) IV Stopped (10/02/23 1325)    ceFAZolin (ANCEF) IV     heparin 500 Units/hr (10/02/23 1600)   PRN Meds:.acetaminophen, albuterol, HYDROmorphone (DILAUDID) injection, oxyCODONE, traZODone    Objective: Weight change: 0 kg  Intake/Output Summary (Last 24 hours) at 10/02/2023 1737 Last data filed at  10/02/2023 1600 Gross per 24 hour  Intake 831.79 ml  Output 500 ml  Net 331.79 ml   Blood pressure (!) 89/58, pulse 80, temperature 98.3 F (36.8 C), temperature source Oral, resp. rate (!) 8, height 5\' 7"  (1.702 m), weight 63.3 kg, SpO2 98%. Temp:  [97.9 F (36.6 C)-98.4 F (36.9 C)] 98.3 F (36.8 C) (10/10 0815) Pulse Rate:  [73-163] 80 (10/10 0700) Resp:  [8-15] 8 (10/10 1700) BP: (81-121)/(58-103) 89/58 (10/10 1700) SpO2:  [82 %-100 %] 98 % (10/10 1700) Weight:  [63.3 kg] 63.3 kg (10/10 0530)  Physical Exam: Physical Exam Constitutional:      Appearance: He is well-developed.  HENT:     Head: Normocephalic and atraumatic.  Eyes:     Conjunctiva/sclera: Conjunctivae normal.  Cardiovascular:     Rate and Rhythm: Normal rate and regular rhythm.  Pulmonary:     Effort: Pulmonary effort is normal.  Abdominal:     General: There is distension.     Palpations: Abdomen is soft.  Musculoskeletal:        General: Normal range  of motion.     Cervical back: Normal range of motion and neck supple.  Skin:    General: Skin is warm and dry.     Findings: No erythema or rash.  Neurological:     General: No focal deficit present.     Mental Status: He is alert and oriented to person, place, and time.  Psychiatric:        Mood and Affect: Mood normal.        Behavior: Behavior normal.        Thought Content: Thought content normal.        Judgment: Judgment normal.     Abdomen with wound vacuum in place   CBC:    BMET Recent Labs    10/01/23 0545 10/02/23 0237  NA 138 138  K 4.4 4.1  CL 101 99  CO2 29 29  GLUCOSE 96 99  BUN 9 17  CREATININE 0.90 0.96  CALCIUM 9.4 9.4     Liver Panel  Recent Labs    09/30/23 0905  PROT 6.8  ALBUMIN 3.3*  AST 18  ALT 13  ALKPHOS 132*  BILITOT 0.3       Sedimentation Rate No results for input(s): "ESRSEDRATE" in the last 72 hours. C-Reactive Protein No results for input(s): "CRP" in the last 72 hours.  Micro  Results: Recent Results (from the past 720 hour(s))  Aerobic Culture w Gram Stain (superficial specimen)     Status: None   Collection Time: 09/29/23  1:42 PM   Specimen: Abdomen  Result Value Ref Range Status   Specimen Description ABDOMEN  Final   Special Requests DRIVELINE  Final   Gram Stain   Final    RARE WBC SEEN FEW GRAM POSITIVE COCCI Performed at [facility], [address] [place], [place] [address]    Culture MODERATE STAPHYLOCOCCUS AUREUS  Final   Report Status 10/02/2023 FINAL  Final   Organism ID, Bacteria STAPHYLOCOCCUS AUREUS  Final      Susceptibility   Staphylococcus aureus - MIC*    CIPROFLOXACIN <=0.5 SENSITIVE Sensitive     ERYTHROMYCIN >=8 RESISTANT Resistant     GENTAMICIN <=0.5 SENSITIVE Sensitive     OXACILLIN 0.5 SENSITIVE Sensitive     TETRACYCLINE <=1 SENSITIVE Sensitive     VANCOMYCIN 1 SENSITIVE Sensitive     TRIMETH/SULFA <=10 SENSITIVE Sensitive     CLINDAMYCIN <=0.25 SENSITIVE Sensitive     RIFAMPIN <=0.5 SENSITIVE Sensitive     Inducible Clindamycin NEGATIVE Sensitive     LINEZOLID 2 SENSITIVE Sensitive     * MODERATE STAPHYLOCOCCUS AUREUS  Blood culture (routine single)     Status: None (Preliminary result)   Collection Time: 09/29/23  2:00 PM   Specimen: BLOOD RIGHT FOREARM  Result Value Ref Range Status   Specimen Description BLOOD RIGHT FOREARM  Final   Special Requests   Final    BOTTLES DRAWN AEROBIC AND ANAEROBIC Blood Culture adequate volume   Culture   Final    NO GROWTH 3 DAYS Performed at [facility], [address], [place], [place] [address]    Report Status PENDING  Incomplete  Culture, blood (Routine X 2) w Reflex to ID Panel     Status: None (Preliminary result)   Collection Time: 09/29/23  2:01 PM   Specimen: BLOOD  Result Value Ref Range Status   Specimen Description BLOOD LEFT ANTECUBITAL  Final   Special Requests   Final    BOTTLES  DRAWN AEROBIC AND ANAEROBIC Blood Culture adequate volume   Culture    Final    NO GROWTH 3 DAYS Performed at [facility], [address] [place], [place] [address]    Report Status PENDING  Incomplete  MRSA Next Gen by PCR, Nasal     Status: None   Collection Time: 09/29/23 10:16 PM   Specimen: Nasal Mucosa; Nasal Swab  Result Value Ref Range Status   MRSA by PCR Next Gen NOT DETECTED NOT DETECTED Final    Comment: (NOTE) The GeneXpert MRSA Assay (FDA approved for NASAL specimens only), is one component of a comprehensive MRSA colonization surveillance program. It is not intended to diagnose MRSA infection nor to guide or monitor treatment for MRSA infections. Test performance is not FDA approved in patients less than 25 years old. Performed at [facility], [address] [place], [place] [address]   Aerobic/Anaerobic Culture w Gram Stain (surgical/deep wound)     Status: None (Preliminary result)   Collection Time: 10/01/23  2:50 PM   Specimen: Path Tissue  Result Value Ref Range Status   Specimen Description WOUND ABDOMEN  Final   Special Requests PT ON DAPTOMYCIN  Final   Gram Stain   Final    ABUNDANT WBC PRESENT, PREDOMINANTLY PMN NO ORGANISMS SEEN    Culture   Final    CULTURE REINCUBATED FOR BETTER GROWTH Performed at [facility], [address], [place], [place] [address]    Report Status PENDING  Incomplete    Studies/Results: No results found.    Assessment/Plan:  INTERVAL HISTORY: He is now status post surgery with debridement and cultures having been obtained  Principal Problem:   Infection associated with driveline of left ventricular assist device (LVAD) (HCC) Active Problems:   Staphylococcus aureus infection    Joel York is a 37 y.o. male with LVAD and driveline infection due to MSSA sp I and  D  We will continue him on cefazolin  Will follow-up intraoperative cultures  Blood cultures taken as outpatient remain negative.  Will plan on giving him a course of cefazolin less we find a  different organism  I have personally spent 50 minutes involved in face-to-face and non-face-to-face activities for this patient on the day of the visit. Professional time spent includes the following activities: Preparing to see the patient (review of tests), Obtaining and/or reviewing separately obtained history (admission/discharge record), Performing a medically appropriate examination and/or evaluation , Ordering medications/tests/procedures, referring and communicating with other health care professionals, Documenting clinical information in the EMR, Independently interpreting results (not separately reported), Communicating results to the patient/family/caregiver, Counseling and educating the patient/family/caregiver and Care coordination (not separately reported).     LOS: 3 days   Acey Lav 10/02/2023, 5:37 PM

## 2023-10-02 NOTE — Progress Notes (Signed)
PHARMACY CONSULT NOTE FOR:  OUTPATIENT  PARENTERAL ANTIBIOTIC THERAPY (OPAT)  Indication: MSSA LVAD DLI Regimen: Cefazolin 2g IV every 8 hours End date: 11/18/23   IV antibiotic discharge orders are pended. To discharging provider:  please sign these orders via discharge navigator,  Select New Orders & click on the button choice - Manage This Unsigned Work.     Thank you for allowing pharmacy to be a part of this patient's care.  Georgina Pillion, PharmD, BCPS, BCIDP Infectious Diseases Clinical Pharmacist 10/07/2023 11:11 AM   **Pharmacist phone directory can now be found on [email or web address] ([email or web address]).  Listed under [facility].

## 2023-10-02 NOTE — Anesthesia Postprocedure Evaluation (Signed)
Anesthesia Post Note  Patient: Joel York  Procedure(s) Performed: VAD TUNNEL DEBRIDEMENT APPLICATION OF WOUND VAC     Patient location during evaluation: PACU Anesthesia Type: General Level of consciousness: awake and alert Pain management: pain level controlled Vital Signs Assessment: post-procedure vital signs reviewed and stable Respiratory status: spontaneous breathing, nonlabored ventilation, respiratory function stable and patient connected to nasal cannula oxygen Cardiovascular status: blood pressure returned to baseline and stable Postop Assessment: no apparent nausea or vomiting Anesthetic complications: no   No notable events documented.  Last Vitals:  Vitals:   10/02/23 1200 10/02/23 1300  BP: 99/81 (!) 84/72  Pulse:    Resp: 10 10  Temp:    SpO2:      Last Pain:  Vitals:   10/02/23 1326  TempSrc:   PainSc: Asleep                 Mariann Barter

## 2023-10-02 NOTE — Progress Notes (Addendum)
Advanced Heart Failure VAD Team Note  PCP-Cardiologist: None   Subjective:   09/29/23: admitted with DL infection. CT A/P with fluid collection around driveline suggestive of abscess. Wound Cx + Staph Aureus  09/30/23: developed significant bleeding from DL site>>given FFP 08/30/10: I/D with CTS with wound vac placement  Transitioned to Cefazolin per ID. Afebrile. WBC 7.3. BCxNG x 2 days. Surgical wound cultures 10/9 pending.  Wound vac output 200 yesterday. Hgb stable post procedure 13.6>12.8. Heparin gtt resumed this am. INR 1.4.  Feeling better, no dyspnea. Pain constant around 3-4/10, managing with PRN oxy. Adding stool softeners last reported BM 10/7. Reports normal appetite.   LVAD INTERROGATION:  HeartMate II LVAD:   Flow 4.2 liters/min, speed 5600, power 4., PI 4.6.  6 PI events this morning (no speed drops)  Objective:    Vital Signs:   Temp:  [97.6 F (36.4 C)-98.4 F (36.9 C)] 98.4 F (36.9 C) (10/10 0400) Pulse Rate:  [64-163] 80 (10/10 0700) Resp:  [8-19] 8 (10/10 0514) BP: (81-121)/(62-103) 94/79 (10/10 0800) SpO2:  [82 %-100 %] 82 % (10/10 0700) Weight:  [63.1 kg-63.3 kg] 63.3 kg (10/10 0530) Last BM Date : 09/28/23 Mean arterial Pressure 76  Intake/Output:   Intake/Output Summary (Last 24 hours) at 10/02/2023 0818 Last data filed at 10/02/2023 0400 Gross per 24 hour  Intake 766.23 ml  Output 1000 ml  Net -233.77 ml     Physical Exam   General:  resting in bed. No distress  HEENT: Normal Neck: supple. JVP 5. Carotids 2+ bilat; no bruits. No lymphadenopathy or thyromegaly appreciated. Cor: Mechanical heart sounds with LVAD hum present. Lungs: CTAB. No wheezing  Abdomen: soft, tender, nondistended. No hepatosplenomegaly. No bruits or masses. Good bowel sounds. Driveline: C/D/I; securement device intact. Wound vac in place. Area surrounding driveline dressing warm and pink. Extremities: no cyanosis, clubbing, rash, no edema Neuro: alert & orientedx3,  cranial nerves grossly intact. moves all 4 extremities w/o difficulty. Affect pleasant   Telemetry   NSR 90s (Personally reviewed)    EKG    No new EKG to review  Labs   Basic Metabolic Panel: Recent Labs  Lab 09/29/23 1300 09/30/23 0221 09/30/23 0905 10/01/23 0545 10/02/23 0237  NA 134* 138 139 138 138  K 3.3* 3.8 4.5 4.4 4.1  CL 100 98 98 101 99  CO2 23 31 27 29 29   GLUCOSE 160* 97 85 96 99  BUN 10 10 9 9 17   CREATININE 0.83 0.91 0.93 0.90 0.96  CALCIUM 8.9 8.9 9.2 9.4 9.4  MG  --  1.8 1.8  --   --     Liver Function Tests: Recent Labs  Lab 09/30/23 0905  AST 18  ALT 13  ALKPHOS 132*  BILITOT 0.3  PROT 6.8  ALBUMIN 3.3*   No results for input(s): "LIPASE", "AMYLASE" in the last 168 hours. No results for input(s): "AMMONIA" in the last 168 hours.  CBC: Recent Labs  Lab 09/29/23 1401 09/30/23 0221 09/30/23 1106 10/01/23 0545 10/02/23 0237  WBC 11.0* 8.6 9.7 7.7 7.3  HGB 13.4 13.7 13.9 13.6 12.8*  HCT 40.1 42.8 43.1 42.8 39.7  MCV 85.9 88.2 85.7 86.5 87.3  PLT 248 235 270 275 265    INR: Recent Labs  Lab 09/29/23 1300 09/30/23 0221 09/30/23 1057 10/01/23 0545 10/02/23 0237  INR 2.0* 2.2* 2.3* 1.4* 1.4*    Other results: EKG:    Imaging   ECHOCARDIOGRAM LIMITED  Result Date: 09/30/2023  ECHOCARDIOGRAM LIMITED REPORT   Patient Name:   Joel York Date of Exam: 09/30/2023 Medical Rec #:  [id number]    Height:       67.0 in Accession #:    [id number]   Weight:       143.3 lb Date of Birth:  1986/07/24    BSA:          1.755 m Patient Age:    37 years     BP:           111/91 mmHg Patient Gender: M            HR:           74 bpm. Exam Location:  Inpatient Procedure: Limited Echo, Color Doppler and Cardiac Doppler Indications:    LVAD  History:        Patient has prior history of Echocardiogram examinations, most                 recent 08/06/2023. LVAD and CHF; Acute MI.  Sonographer:    Milbert Coulter Referring Phys: [id number] PETER VANTRIGT  IMPRESSIONS  1. Heartmate III     At 5600 RPM, septum is midline, aortic valves opens every other beat, no aortic regurgitation, no mitral regurgitation. Inflow cannula visualized no apparent clot. Left ventricular ejection fraction, by estimation, is <20%. The left ventricle has severely decreased function.  2. Right ventricular systolic function is moderately reduced.  3. The inferior vena cava is normal in size with <50% respiratory variability, suggesting right atrial pressure of 8 mmHg. FINDINGS  Left Ventricle: Heartmate III At 5600 RPM, septum is midline, aortic valves opens every other beat, no aortic regurgitation, no mitral regurgitation. Inflow cannula visualized no apparent clot. Left ventricular ejection fraction, by estimation, is <20%. The left ventricle has severely decreased function. Right Ventricle: Right ventricular systolic function is moderately reduced. Tricuspid Valve: Tricuspid valve regurgitation is mild. Venous: The inferior vena cava is normal in size with less than 50% respiratory variability, suggesting right atrial pressure of 8 mmHg. Additional Comments: Spectral Doppler performed. Color Doppler performed.  Thomasene Ripple DO Electronically signed by Thomasene Ripple DO Signature Date/Time: 09/30/2023/5:38:43 PM    Final    DG Chest Port 1 View  Result Date: 09/30/2023 CLINICAL DATA:  [id number] LVAD (left ventricular assist device) present [facility]) [id number] EXAM: PORTABLE CHEST 1 VIEW COMPARISON:  08/21/2023 FINDINGS: Previous median sternotomy and LVAD noted. Stable cardiomegaly. Lungs remain clear. No CHF pattern. No large effusion or pneumothorax. Trachea midline. Overall stable exam. No osseous abnormality. IMPRESSION: Stable chest exam. No interval change. Electronically Signed   By: Judie Petit.  Shick M.D.   On: 09/30/2023 13:32     Medications:     Scheduled Medications:  digoxin  0.125 mg Oral Daily   feeding supplement  237 mL Oral TID WC   ivabradine  5 mg Oral BID WC   losartan  25 mg  Oral BID   mexiletine  200 mg Oral BID   pantoprazole  40 mg Oral Daily   sertraline  25 mg Oral Daily   zinc sulfate  220 mg Oral Daily    Infusions:   ceFAZolin (ANCEF) IV 200 mL/hr at 10/02/23 0400   heparin 500 Units/hr (10/02/23 0729)    PRN Medications: acetaminophen, albuterol, HYDROmorphone (DILAUDID) injection, oxyCODONE, traZODone   Patient Profile     Assessment/Plan:   1. Acute Driveline Infection - Prior clinic visit 9/30 DL site was noted by VAD RN to be not  completely incorporated. Long discussion about strategies to protect DL and keep it immobilized. Reported mild itching and redness at the time. - Now admitted with driveline infection.  - CT chest/abd/pelvis showed abscess  - Initial Wound Cx + Staph Aureus - BCx NGTD  - Infectious disease consulted; Dapto transitioned to Cefazolin IV - 10/9 debridement + wound vac, 200cc output yesterday - 10/9 Surgical wound cultures pending - Hgb stable 13.6>12.8 - Oxy PRN for pain, add stool softeners  2. 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 ? 2/2 PVCs vs genetically mediated +/- hypertension.  - LHC: normal coronaries.  - cMRI:  LV markedly dilated EF 10% RV 17% NICM - Underwent HM-III VAD on 07/28/25. Much improved NYHA I-II  - INR Goal 2.0-3.0. INR 1.4. Coumadin held for OR 10/9. Resume Heparin gtt today. - Euvolemic to dry on exam - Continue digoxin 0.125 mg daily - Continue losartan 25 mg BID - Continue Ivabradine 5 BID - Strict I&O, daily weights - Imperative that he stops smoking so he may be considered for Transplant  3. PVCs - Suppressed with Mexiletine 200 mg BID - likely can stop soon  4. Tobacco use - reports quit smoking 7/24 - Cotinine level at 30.1 09/22/23  5. Hx drug abuse - Continue Suboxone  Transition to PCU today.  I reviewed the LVAD parameters from today, and  compared the results to the patient's prior recorded data.  No programming changes were made.  The LVAD is functioning within specified parameters.  The patient performs LVAD self-test daily.  LVAD interrogation was negative for any significant power changes, alarms or PI events/speed drops.  LVAD equipment check completed and is in good working order.  Back-up equipment present.   LVAD education done on emergency procedures and precautions and reviewed exit site care.  Length of Stay: 3  [place] Lee, NP 10/02/2023, 8:18 AM  VAD Team --- VAD ISSUES ONLY--- Pager [phone] (7am - 7am)  Advanced Heart Failure Team  Pager [phone] (M-F; 7a - 5p)  Please contact [facility] for night-coverage after hours (5p -7a ) and weekends on [email or web address]  Patient seen with NP, agree with the above note.   Driveline site debridement yesterday with wound vac placement.  Reports ongoing site pain.   INR 1.4 today, on heparin gtt.  Transitioned to cefazolin for MSSA.   General: Well appearing this am. NAD.  HEENT: Normal. Neck: Supple, JVP 7-8 cm. Carotids OK.  Cardiac:  Mechanical heart sounds with LVAD hum present.  Lungs:  CTAB, normal effort.  Abdomen:  NT, ND, no HSM. No bruits or masses. +BS  LVAD exit site: Wound vac Extremities:  Warm and dry. No cyanosis, clubbing, rash, or edema.  Neuro:  Alert & oriented x 3. Cranial nerves grossly intact. Moves all 4 extremities w/o difficulty. Affect pleasant    Ambulate, pain control.  Back to OR early next week for repeat debridement/wound vac change.   Continue cefazolin for MSSA.   INR 1.4 on heparin gtt, discuss restarting low dose warfarin to keep INR around 1.8 with Dr. Donata Clay.   Can to to [facility].   Marca Ancona 10/02/2023 10:07 AM

## 2023-10-03 ENCOUNTER — Encounter (HOSPITAL_COMMUNITY): Payer: Self-pay | Admitting: Cardiology

## 2023-10-03 ENCOUNTER — Inpatient Hospital Stay (HOSPITAL_COMMUNITY): Payer: Medicaid Other | Admitting: Certified Registered Nurse Anesthetist

## 2023-10-03 ENCOUNTER — Encounter (HOSPITAL_COMMUNITY)
Admission: AD | Disposition: A | Discharge status: Home Health | Payer: Self-pay | Source: Ambulatory Visit | Attending: Cardiology

## 2023-10-03 ENCOUNTER — Other Ambulatory Visit: Payer: Self-pay

## 2023-10-03 DIAGNOSIS — T827XXA Infection and inflammatory reaction due to other cardiac and vascular devices, implants and grafts, initial encounter: Secondary | ICD-10-CM

## 2023-10-03 DIAGNOSIS — I509 Heart failure, unspecified: Secondary | ICD-10-CM

## 2023-10-03 DIAGNOSIS — Z4801 Encounter for change or removal of surgical wound dressing: Secondary | ICD-10-CM | POA: Diagnosis not present

## 2023-10-03 HISTORY — PX: APPLICATION OF WOUND VAC: SHX5189

## 2023-10-03 HISTORY — PX: STERNAL WOUND DEBRIDEMENT: SHX1058

## 2023-10-03 LAB — BASIC METABOLIC PANEL
Anion gap: 13 (ref 5–15)
BUN: 14 mg/dL (ref 6–20)
CO2: 29 mmol/L (ref 22–32)
Calcium: 9 mg/dL (ref 8.9–10.3)
Chloride: 98 mmol/L (ref 98–111)
Creatinine, Ser: 0.76 mg/dL (ref 0.61–1.24)
GFR, Estimated: >60 mL/min (ref >60)
Glucose, Bld: 94 mg/dL (ref 70–99)
Potassium: 3.6 mmol/L (ref 3.5–5.1)
Sodium: 140 mmol/L (ref 135–145)

## 2023-10-03 LAB — CBC
HCT: 37.8 % — ABNORMAL LOW (ref 39.0–52.0)
Hemoglobin: 12.5 g/dL — ABNORMAL LOW (ref 13.0–17.0)
MCH: 28.9 pg (ref 26.0–34.0)
MCHC: 33.1 g/dL (ref 30.0–36.0)
MCV: 87.5 fL (ref 80.0–100.0)
Platelets: 282 10*3/uL (ref 150–400)
RBC: 4.32 MIL/uL (ref 4.22–5.81)
RDW: 13.9 % (ref 11.5–15.5)
WBC: 5.7 10*3/uL (ref 4.0–10.5)
nRBC: 0 % (ref 0.0–0.2)

## 2023-10-03 LAB — HEPARIN LEVEL (UNFRACTIONATED): Heparin Unfractionated: <0.1 [IU]/mL — ABNORMAL LOW (ref 0.30–0.70)

## 2023-10-03 LAB — LACTATE DEHYDROGENASE: LDH: 139 U/L (ref 98–192)

## 2023-10-03 LAB — PROTIME-INR
INR: 1.1 (ref 0.8–1.2)
Prothrombin Time: 14.4 s (ref 11.4–15.2)

## 2023-10-03 SURGERY — APPLICATION, WOUND VAC
Anesthesia: Monitor Anesthesia Care

## 2023-10-03 MED ORDER — PROPOFOL 10 MG/ML IV BOLUS
INTRAVENOUS | Status: AC
  Filled 2023-10-03: qty 20

## 2023-10-03 MED ORDER — ATROPINE SULFATE 1 MG/ML IV SOLN
INTRAVENOUS | Status: DC | PRN
Start: 2023-10-03 — End: 2023-10-03
  Administered 2023-10-03: .5 mg via INTRAVENOUS

## 2023-10-03 MED ORDER — CHLORHEXIDINE GLUCONATE 0.12 % MT SOLN: 15.0000 mL | Freq: Once | OROMUCOSAL | Status: DC

## 2023-10-03 MED ORDER — FENTANYL CITRATE (PF) 100 MCG/2ML IJ SOLN
INTRAMUSCULAR | Status: AC
  Filled 2023-10-03: qty 2

## 2023-10-03 MED ORDER — EPINEPHRINE 1 MG/10ML IJ SOSY
PREFILLED_SYRINGE | INTRAMUSCULAR | Status: AC
  Filled 2023-10-03: qty 10

## 2023-10-03 MED ORDER — FENTANYL CITRATE (PF) 250 MCG/5ML IJ SOLN
INTRAMUSCULAR | Status: DC | PRN
  Administered 2023-10-03 (×2): 50 ug via INTRAVENOUS

## 2023-10-03 MED ORDER — PHENYLEPHRINE 80 MCG/ML (10ML) SYRINGE FOR IV PUSH (FOR BLOOD PRESSURE SUPPORT)
PREFILLED_SYRINGE | INTRAVENOUS | Status: DC | PRN
  Administered 2023-10-03 (×4): 160 ug via INTRAVENOUS

## 2023-10-03 MED ORDER — DEXAMETHASONE SODIUM PHOSPHATE 10 MG/ML IJ SOLN
INTRAMUSCULAR | Status: DC | PRN
  Administered 2023-10-03: 10 mg via INTRAVENOUS

## 2023-10-03 MED ORDER — SODIUM CHLORIDE 0.9 % IV SOLN
INTRAVENOUS | Status: DC | PRN
Start: 2023-10-03 — End: 2023-10-03

## 2023-10-03 MED ORDER — ACETAMINOPHEN 10 MG/ML IV SOLN
INTRAVENOUS | Status: AC
  Filled 2023-10-03: qty 100

## 2023-10-03 MED ORDER — ALBUMIN HUMAN 5 % IV SOLN
12.5000 g | Freq: Once | INTRAVENOUS | Status: AC
  Administered 2023-10-03: 12.5 g via INTRAVENOUS
  Filled 2023-10-03: qty 250

## 2023-10-03 MED ORDER — OXYCODONE HCL 5 MG/5ML PO SOLN: 5.0000 mg | Freq: Once | ORAL | Status: DC | PRN

## 2023-10-03 MED ORDER — ORAL CARE MOUTH RINSE
15.0000 mL | Freq: Once | OROMUCOSAL | Status: AC
  Administered 2023-10-03: 15 mL via OROMUCOSAL

## 2023-10-03 MED ORDER — FENTANYL CITRATE (PF) 100 MCG/2ML IJ SOLN
25.0000 ug | INTRAMUSCULAR | Status: DC | PRN
  Administered 2023-10-03 (×2): 25 ug via INTRAVENOUS
  Administered 2023-10-03 (×2): 50 ug via INTRAVENOUS

## 2023-10-03 MED ORDER — ACETAMINOPHEN 160 MG/5ML PO SOLN: 1000.0000 mg | Freq: Once | ORAL | Status: DC | PRN

## 2023-10-03 MED ORDER — LIDOCAINE 2% (20 MG/ML) 5 ML SYRINGE
INTRAMUSCULAR | Status: DC | PRN
  Administered 2023-10-03: 60 mg via INTRAVENOUS

## 2023-10-03 MED ORDER — SODIUM CHLORIDE 0.9% FLUSH: 10.0000 mL | INTRAVENOUS | Status: DC | PRN

## 2023-10-03 MED ORDER — VANCOMYCIN HCL 1000 MG IV SOLR
INTRAVENOUS | Status: AC
  Filled 2023-10-03: qty 20

## 2023-10-03 MED ORDER — ROCURONIUM BROMIDE 10 MG/ML (PF) SYRINGE
PREFILLED_SYRINGE | INTRAVENOUS | Status: AC
  Filled 2023-10-03: qty 10

## 2023-10-03 MED ORDER — ACETAMINOPHEN 500 MG PO TABS: 1000.0000 mg | ORAL_TABLET | Freq: Once | ORAL | Status: DC | PRN

## 2023-10-03 MED ORDER — OXYCODONE HCL 5 MG PO TABS: 5.0000 mg | ORAL_TABLET | Freq: Once | ORAL | Status: DC | PRN

## 2023-10-03 MED ORDER — VASHE WOUND IRRIGATION OPTIME
TOPICAL | Status: DC | PRN
Start: 2023-10-03 — End: 2023-10-03
  Administered 2023-10-03: 34 [oz_av]

## 2023-10-03 MED ORDER — ALBUMIN HUMAN 5 % IV SOLN
INTRAVENOUS | Status: DC | PRN
Start: 2023-10-03 — End: 2023-10-03

## 2023-10-03 MED ORDER — ONDANSETRON HCL 4 MG/2ML IJ SOLN
INTRAMUSCULAR | Status: DC | PRN
  Administered 2023-10-03: 4 mg via INTRAVENOUS

## 2023-10-03 MED ORDER — SUGAMMADEX SODIUM 200 MG/2ML IV SOLN
INTRAVENOUS | Status: DC | PRN
  Administered 2023-10-03 (×2): 100 mg via INTRAVENOUS

## 2023-10-03 MED ORDER — PHENYLEPHRINE HCL-NACL 20-0.9 MG/250ML-% IV SOLN
INTRAVENOUS | Status: DC | PRN
  Administered 2023-10-03: 25 ug/min via INTRAVENOUS

## 2023-10-03 MED ORDER — HEPARIN (PORCINE) 25000 UT/250ML-% IV SOLN
500.0000 [IU]/h | INTRAVENOUS | Status: DC
  Administered 2023-10-03 – 2023-10-05 (×2): 500 [IU]/h via INTRAVENOUS
  Filled 2023-10-03 (×2): qty 250

## 2023-10-03 MED ORDER — ROCURONIUM BROMIDE 10 MG/ML (PF) SYRINGE
PREFILLED_SYRINGE | INTRAVENOUS | Status: DC | PRN
  Administered 2023-10-03: 70 mg via INTRAVENOUS

## 2023-10-03 MED ORDER — MAGNESIUM SULFATE 50 % IJ SOLN
INTRAVENOUS | Status: DC | PRN
  Administered 2023-10-03: 1 g via INTRAVENOUS

## 2023-10-03 MED ORDER — SODIUM CHLORIDE 0.9 % IR SOLN
Status: DC | PRN
  Administered 2023-10-03: 1000 mL

## 2023-10-03 MED ORDER — EPHEDRINE SULFATE-NACL 50-0.9 MG/10ML-% IV SOSY
PREFILLED_SYRINGE | INTRAVENOUS | Status: DC | PRN
  Administered 2023-10-03: 10 mg via INTRAVENOUS
  Administered 2023-10-03 (×2): 5 mg via INTRAVENOUS

## 2023-10-03 MED ORDER — ACETAMINOPHEN 10 MG/ML IV SOLN
1000.0000 mg | Freq: Once | INTRAVENOUS | Status: DC | PRN
  Administered 2023-10-03: 1000 mg via INTRAVENOUS

## 2023-10-03 MED ORDER — CALCIUM CHLORIDE 10 % IV SOLN
INTRAVENOUS | Status: DC | PRN
Start: 2023-10-03 — End: 2023-10-03
  Administered 2023-10-03 (×3): 250 mg via INTRAVENOUS

## 2023-10-03 MED ORDER — ONDANSETRON HCL 4 MG/2ML IJ SOLN
INTRAMUSCULAR | Status: AC
  Filled 2023-10-03: qty 2

## 2023-10-03 MED ORDER — PROPOFOL 10 MG/ML IV BOLUS
INTRAVENOUS | Status: DC | PRN
  Administered 2023-10-03: 140 mg via INTRAVENOUS

## 2023-10-03 MED ORDER — FENTANYL CITRATE (PF) 250 MCG/5ML IJ SOLN
INTRAMUSCULAR | Status: AC
  Filled 2023-10-03: qty 5

## 2023-10-03 MED ORDER — LIDOCAINE 2% (20 MG/ML) 5 ML SYRINGE
INTRAMUSCULAR | Status: AC
  Filled 2023-10-03: qty 5

## 2023-10-03 MED ORDER — EPHEDRINE SULFATE (PRESSORS) 50 MG/ML IJ SOLN
INTRAMUSCULAR | Status: DC | PRN
Start: 2023-10-03 — End: 2023-10-03

## 2023-10-03 MED ORDER — MIDAZOLAM HCL 2 MG/2ML IJ SOLN
INTRAMUSCULAR | Status: AC
  Filled 2023-10-03: qty 2

## 2023-10-03 MED ORDER — MIDAZOLAM HCL 2 MG/2ML IJ SOLN
INTRAMUSCULAR | Status: DC | PRN
  Administered 2023-10-03: 2 mg via INTRAVENOUS

## 2023-10-03 MED ORDER — ORAL CARE MOUTH RINSE: 15.0000 mL | OROMUCOSAL | Status: DC | PRN

## 2023-10-03 MED ORDER — SODIUM CHLORIDE 0.9% FLUSH
10.0000 mL | Freq: Two times a day (BID) | INTRAVENOUS | Status: DC
  Administered 2023-10-04 – 2023-10-08 (×4): 10 mL
  Administered 2023-10-08: 20 mL
  Administered 2023-10-08: 40 mL
  Administered 2023-10-09 – 2023-10-12 (×7): 10 mL
  Administered 2023-10-12: 20 mL
  Administered 2023-10-13 – 2023-10-16 (×5): 10 mL

## 2023-10-03 SURGICAL SUPPLY — 72 items
APL SKNCLS STERI-STRIP NONHPOA (GAUZE/BANDAGES/DRESSINGS) ×2
ATTRACTOMAT 16X20 MAGNETIC DRP (DRAPES) ×1 IMPLANT
BAG DECANTER FOR FLEXI CONT (MISCELLANEOUS) ×1 IMPLANT
BENZOIN TINCTURE PRP APPL 2/3 (GAUZE/BANDAGES/DRESSINGS) IMPLANT
BLADE CLIPPER SURG (BLADE) ×1 IMPLANT
BLADE SURG 10 STRL SS (BLADE) ×1 IMPLANT
BLADE SURG 15 STRL LF DISP TIS (BLADE) IMPLANT
BLADE SURG 15 STRL SS (BLADE)
BNDG GAUZE DERMACEA FLUFF 4 (GAUZE/BANDAGES/DRESSINGS) IMPLANT
BNDG GZE DERMACEA 4 6PLY (GAUZE/BANDAGES/DRESSINGS)
CANISTER SUCT 3000ML PPV (MISCELLANEOUS) ×1 IMPLANT
CANISTER WOUND CARE 500ML ATS (WOUND CARE) ×1 IMPLANT
CASSETTE VERAFLO VERALINK (MISCELLANEOUS) IMPLANT
CATH FOLEY 2WAY SLVR 5CC 16FR (CATHETERS) IMPLANT
CATH THORACIC 28FR RT ANG (CATHETERS) IMPLANT
CATH THORACIC 36FR (CATHETERS) IMPLANT
CLIP TI WIDE RED SMALL 24 (CLIP) IMPLANT
CNTNR URN SCR LID CUP LEK RST (MISCELLANEOUS) IMPLANT
CONN Y 3/8X3/8X3/8 BEN (MISCELLANEOUS) IMPLANT
CONT SPEC 4OZ STRL OR WHT (MISCELLANEOUS)
CONTAINER PROTECT SURGISLUSH (MISCELLANEOUS) ×2 IMPLANT
COVER SURGICAL LIGHT HANDLE (MISCELLANEOUS) ×2 IMPLANT
DRAPE LAPAROSCOPIC ABDOMINAL (DRAPES) ×1 IMPLANT
DRAPE SLUSH/WARMER DISC (DRAPES) IMPLANT
DRAPE WARM FLUID 44X44 (DRAPES) IMPLANT
DRESSING VERAFLO CLEANSE CC LG (GAUZE/BANDAGES/DRESSINGS) IMPLANT
DRSG AQUACEL AG ADV 3.5X14 (GAUZE/BANDAGES/DRESSINGS) ×1 IMPLANT
DRSG VAC GRANUFOAM LG (GAUZE/BANDAGES/DRESSINGS) ×1 IMPLANT
DRSG VAC GRANUFOAM MED (GAUZE/BANDAGES/DRESSINGS) ×1 IMPLANT
DRSG VAC GRANUFOAM SM (GAUZE/BANDAGES/DRESSINGS) ×1 IMPLANT
DRSG VERAFLO CLEANSE CC LG (GAUZE/BANDAGES/DRESSINGS) ×1
ELECT REM PT RETURN 9FT ADLT (ELECTROSURGICAL) ×1
ELECTRODE REM PT RTRN 9FT ADLT (ELECTROSURGICAL) ×1 IMPLANT
GAUZE 4X4 16PLY ~~LOC~~+RFID DBL (SPONGE) ×1 IMPLANT
GAUZE PAD ABD 8X10 STRL (GAUZE/BANDAGES/DRESSINGS) IMPLANT
GAUZE SPONGE 4X4 12PLY STRL (GAUZE/BANDAGES/DRESSINGS) ×1 IMPLANT
GAUZE XEROFORM 5X9 LF (GAUZE/BANDAGES/DRESSINGS) IMPLANT
GLOVE BIO SURGEON STRL SZ7.5 (GLOVE) ×2 IMPLANT
GOWN STRL REUS W/ TWL LRG LVL3 (GOWN DISPOSABLE) ×4 IMPLANT
GOWN STRL REUS W/TWL LRG LVL3 (GOWN DISPOSABLE) ×2
HANDPIECE INTERPULSE COAX TIP (DISPOSABLE) ×1
HEMOSTAT POWDER SURGIFOAM 1G (HEMOSTASIS) IMPLANT
HEMOSTAT SURGICEL 2X14 (HEMOSTASIS) IMPLANT
KIT BASIN OR (CUSTOM PROCEDURE TRAY) ×1 IMPLANT
KIT SUCTION CATH 14FR (SUCTIONS) IMPLANT
KIT TURNOVER KIT B (KITS) ×1 IMPLANT
NS IRRIG 1000ML POUR BTL (IV SOLUTION) ×1 IMPLANT
PACK CHEST (CUSTOM PROCEDURE TRAY) ×1 IMPLANT
PACK GENERAL/GYN (CUSTOM PROCEDURE TRAY) ×1 IMPLANT
PAD ARMBOARD 7.5X6 YLW CONV (MISCELLANEOUS) ×2 IMPLANT
SET HNDPC FAN SPRY TIP SCT (DISPOSABLE) ×1 IMPLANT
SOL PREP POV-IOD 4OZ 10% (MISCELLANEOUS) IMPLANT
SPONGE T-LAP 18X18 ~~LOC~~+RFID (SPONGE) ×5 IMPLANT
SPONGE T-LAP 4X18 ~~LOC~~+RFID (SPONGE) ×1 IMPLANT
STAPLER VISISTAT 35W (STAPLE) IMPLANT
SUT ETHILON 3 0 FSL (SUTURE) IMPLANT
SUT PROLENE 4 0 RB 1 (SUTURE) ×1
SUT PROLENE 4-0 RB1 .5 CRCL 36 (SUTURE) IMPLANT
SUT STEEL 6MS V (SUTURE) IMPLANT
SUT STEEL STERNAL CCS#1 18IN (SUTURE) IMPLANT
SUT STEEL SZ 6 DBL 3X14 BALL (SUTURE) IMPLANT
SUT VIC AB 1 CTX 36 (SUTURE)
SUT VIC AB 1 CTX36XBRD ANBCTR (SUTURE) ×2 IMPLANT
SUT VIC AB 2-0 CTX 27 (SUTURE) ×2 IMPLANT
SUT VIC AB 3-0 X1 27 (SUTURE) ×2 IMPLANT
SWAB COLLECTION DEVICE MRSA (MISCELLANEOUS) IMPLANT
SWAB CULTURE ESWAB REG 1ML (MISCELLANEOUS) IMPLANT
SYR 5ML LL (SYRINGE) IMPLANT
TOWEL GREEN STERILE (TOWEL DISPOSABLE) ×1 IMPLANT
TOWEL GREEN STERILE FF (TOWEL DISPOSABLE) ×1 IMPLANT
TRAY FOLEY MTR SLVR 16FR STAT (SET/KITS/TRAYS/PACK) IMPLANT
WATER STERILE IRR 1000ML POUR (IV SOLUTION) ×1 IMPLANT

## 2023-10-03 NOTE — Progress Notes (Signed)
      INFECTIOUS DISEASE ATTENDING ADDENDUM:   Date: 10/03/2023  Patient name: Joel York  Medical record number: [id number]  Date of birth: May 10, 1986   Patient is going Staphylococcus aureus undoubtedly the same sensitive species that grew from the clinic.  We will continue cefazolin.  My partner Dr. Luciana Axe is available for questions this weekend and I will be back on Monday   Acey Lav 10/03/2023, 3:52 PM

## 2023-10-03 NOTE — Progress Notes (Signed)
Pre Procedure note for inpatients:   Joel York has been scheduled for Procedure(s): Wound Vac Change (N/A) Wound Irrigation (N/A) today. The various methods of treatment have been discussed with the patient. After consideration of the risks, benefits and treatment options the patient has consented to the planned procedure.   The patient has been seen and labs reviewed. There are no changes in the patient's condition to prevent proceeding with the planned procedure today.  Recent labs:  Lab Results  Component Value Date   WBC 5.7 10/03/2023   HGB 12.5 (L) 10/03/2023   HCT 37.8 (L) 10/03/2023   PLT 282 10/03/2023   GLUCOSE 94 10/03/2023   CHOL 74 07/15/2023   TRIG 140 07/15/2023   HDL 16 (L) 07/15/2023   LDLCALC 30 07/15/2023   ALT 13 09/30/2023   AST 18 09/30/2023   NA 140 10/03/2023   K 3.6 10/03/2023   CL 98 10/03/2023   CREATININE 0.76 10/03/2023   BUN 14 10/03/2023   CO2 29 10/03/2023   TSH 4.982 (H) 07/14/2023   INR 1.1 10/03/2023   HGBA1C 6.0 (H) 07/30/2023   MICROALBUR <3.0 (H) 07/14/2023    Lovett Sox, MD 10/03/2023 10:03 AM

## 2023-10-03 NOTE — Progress Notes (Signed)
Peripherally Inserted Central Catheter Placement  The IV Nurse has discussed with the patient and/or persons authorized to consent for the patient, the purpose of this procedure and the potential benefits and risks involved with this procedure.  The benefits include less needle sticks, lab draws from the catheter, and the patient may be discharged home with the catheter. Risks include, but not limited to, infection, bleeding, blood clot (thrombus formation), and puncture of an artery; nerve damage and irregular heartbeat and possibility to perform a PICC exchange if needed/ordered by physician.  Alternatives to this procedure were also discussed.  Bard Power PICC patient education guide, fact sheet on infection prevention and patient information card has been provided to patient /or left at bedside.    PICC Placement Documentation  PICC Double Lumen 10/03/23 Right Brachial 39 cm 0 cm (Active)  Indication for Insertion or Continuance of Line Chronic illness with exacerbations (CF, Sickle Cell, etc.) 10/03/23 1637  Exposed Catheter (cm) 0 cm 10/03/23 1637  Site Assessment Clean, Dry, Intact 10/03/23 1637  Lumen #1 Status Flushed;Saline locked;Blood return noted 10/03/23 1637  Lumen #2 Status Flushed;Saline locked;Blood return noted 10/03/23 1637  Dressing Type Transparent;Securing device 10/03/23 1637  Dressing Status Antimicrobial disc in place 10/03/23 1637  Line Care Connections checked and tightened 10/03/23 1637  Line Adjustment (NICU/IV Team Only) No 10/03/23 1637  Dressing Intervention New dressing 10/03/23 1637  Dressing Change Due 10/10/23 10/03/23 1637       Vernona Rieger  Shelaine Frie 10/03/2023, 4:39 PM

## 2023-10-03 NOTE — Progress Notes (Signed)
VAD Coordinator Procedure Note:   VAD Coordinator met patient in SS 36. Pt undergoing wound VAC change on LVAD driveline per Dr. Donata Clay. Hemodynamics and VAD parameters monitored by myself and anesthesia throughout the procedure. Blood pressures were obtained with automatic cuff on right arm.    Time: Doppler Auto  BP Flow PI Power Speed  Pre-procedure:  0957  99/87(93) 3.9 4.8 4.4 5600   1038  91/57(69) 4.2 4 4.3 5600   1114  100/61(72) 4 4.7 4.4 5600  Sedation Induction: 1118  114/97 (104) 4.3 3.5 4.3 5600  Speed dropped due to suction and runs of VT 1130  111/100 (106) 4.2 2.6 4 5500   1145  97/65(76) 3.8 4.5 4.1 5500   1200  133/98 (102) 3.2 8.1 4.5 5500  0.5 atropine-pt had LF assoc w/bradycardia 1205  123/93 (102) 3.3 6.4 4.1 5500  Speed increased back to 5600 1215  136/104 (114) 3.4 7.5 4.5 5600           Recovery Area: 1228  117/83(94) 3.9 4.3 4.2 5600   1245  112/82(92) 3.9 3.6 4.2 5600    Patient tolerated the procedure well. VAD Coordinator accompanied and remained with patient in recovery area.    Patient Disposition: pt transported to [facility] and handoff given to bedside nurse.   Carlton Adam RN, BSN VAD Coordinator 24/7 Pager [phone]

## 2023-10-03 NOTE — Progress Notes (Signed)
PHARMACY - ANTICOAGULATION CONSULT NOTE  Pharmacy Consult for Heparin infusion Indication:  LVAD  Allergies  Allergen Reactions   Other Anaphylaxis    Tree Nuts   Peanuts [Peanut Oil] Anaphylaxis   Chlorhexidine Rash    Patient Measurements: Height: 5\' 7"  (170.2 cm) Weight: 63.3 kg (139 lb 8.8 oz) IBW/kg (Calculated) : 66.1 Heparin Dosing Weight: 63.1 kg  Vital Signs: Temp: 98.7 F (37.1 C) (10/11 0739) Temp Source: Oral (10/11 0739) BP: 98/86 (10/11 0739) Pulse Rate: 73 (10/11 0824)  Labs: Recent Labs    10/01/23 0545 10/02/23 0237 10/02/23 1340 10/03/23 0232  HGB 13.6 12.8*  --  12.5*  HCT 42.8 39.7  --  37.8*  PLT 275 265  --  282  APTT 39*  --   --   --   LABPROT 17.3* 17.0*  --  14.4  INR 1.4* 1.4*  --  1.1  HEPARINUNFRC  --   --  <0.10* <0.10*  CREATININE 0.90 0.96  --  0.76  CKTOTAL 44*  --   --   --     Estimated Creatinine Clearance: 114.3 mL/min (by C-G formula based on SCr of 0.76 mg/dL).   Medical History: Past Medical History:  Diagnosis Date   Acid reflux    ADHD (attention deficit hyperactivity disorder)    Asthma    Back pain    Seizures (HCC)    Resolved    Medications:  Infusions:    ceFAZolin (ANCEF) IV 2 g (10/03/23 0313)    ceFAZolin (ANCEF) IV     heparin 500 Units/hr (10/03/23 0737)    Assessment: 37 yo male with LVAD, admitted with driveline infection. On warfarin PTA - PTA regimen is 2mg  daily except 3 mg MF. INR on admit therapeutic at 2.  Now INR 1.1- holding warfarin and after FFP. Given INR<1.8, starting on heparin infusion at 500 units/hr. Hgb 12.8, plt 265. LDH 136. Had evidence of blood retained in sponge of wound VAC upon CTVS exam. No infusion issues. S/p I&D 10/9 - and plan for repeat 10/11. No titration in rate at this time per MD.   Goal of Therapy:  Heparin level 0.2-0.3 units/ml Monitor platelets by anticoagulation protocol: Yes   Plan:  Continue heparin 500 units/hr - will hold for OR and resume post op  as indicated Monitor daily CBC, heparin level, and for s/sx of bleeding F/u plans for transition back to warfarin once cleared by CTVS   Leota Sauers Pharm.D. CPP, BCPS Clinical Pharmacist [phone] 10/03/2023 9:05 AM    Please check AMION for all [facility] Pharm[facility] phone numbers After 10:00 PM, call Main Pharmacy [phone]

## 2023-10-03 NOTE — Progress Notes (Addendum)
Advanced Heart Failure VAD Team Note  PCP-Cardiologist: None   Subjective:   09/29/23: admitted with DL infection. CT A/P with fluid collection around driveline suggestive of abscess. Wound Cx + Staph Aureus  09/30/23: developed significant bleeding from DL site>>given FFP 47/8/29: I/D with CTS with wound vac placement  Transitioned to Cefazolin per ID. Afebrile. WBC 5.7. BCxNG x 3 days. Surgical wound cultures 10/9 with MSSA.  Wound vac output 50 yesterday. Hgb stable post procedure. Continue Heparin gtt. INR 1.1.  Feels "rough" 2/2 abd pain. Plan to return to OR today for I&O and wound vac change d/t blood clot formation around wound vac sponge.   LVAD INTERROGATION:  HeartMate II LVAD:   Flow 3.7 liters/min, speed 5600, power 4.2, PI 6.  x7 PI events this morning (no speed drops)  Objective:    Vital Signs:   Temp:  [97.7 F (36.5 C)-98.4 F (36.9 C)] 98.4 F (36.9 C) (10/10 2328) Pulse Rate:  [71-80] 71 (10/10 2328) Resp:  [8-15] 15 (10/11 0552) BP: (84-113)/(58-90) 88/69 (10/11 0552) SpO2:  [98 %] 98 % (10/10 1930) Last BM Date : 09/30/23 VAD Mean arterial Pressure 80s, 1 elevated at 100 2/2 pain  Intake/Output:   Intake/Output Summary (Last 24 hours) at 10/03/2023 0729 Last data filed at 10/03/2023 0400 Gross per 24 hour  Intake 721.76 ml  Output 950 ml  Net -228.24 ml     Physical Exam   General:  Well appearing. No resp difficulty HEENT: Normal Neck: supple. JVP ~6. Carotids 2+ bilat; no bruits. No lymphadenopathy or thyromegaly appreciated. Cor: Mechanical heart sounds with LVAD hum present. Lungs: Clear Abdomen: soft, tender, nondistended. No hepatosplenomegaly. No bruits or masses. Good bowel sounds. Driveline: C/D/I; securement device intact. Wound vac sponge around DL exit site.  Extremities: no cyanosis, clubbing, rash, edema Neuro: alert & orientedx3, cranial nerves grossly intact. moves all 4 extremities w/o difficulty. Affect pleasant    Telemetry   NSR 80s (Personally reviewed)    EKG    No new EKG to review  Labs   Basic Metabolic Panel: Recent Labs  Lab 09/30/23 0221 09/30/23 0905 10/01/23 0545 10/02/23 0237 10/03/23 0232  NA 138 139 138 138 140  K 3.8 4.5 4.4 4.1 3.6  CL 98 98 101 99 98  CO2 31 27 29 29 29   GLUCOSE 97 85 96 99 94  BUN 10 9 9 17 14   CREATININE 0.91 0.93 0.90 0.96 0.76  CALCIUM 8.9 9.2 9.4 9.4 9.0  MG 1.8 1.8  --   --   --     Liver Function Tests: Recent Labs  Lab 09/30/23 0905  AST 18  ALT 13  ALKPHOS 132*  BILITOT 0.3  PROT 6.8  ALBUMIN 3.3*   No results for input(s): "LIPASE", "AMYLASE" in the last 168 hours. No results for input(s): "AMMONIA" in the last 168 hours.  CBC: Recent Labs  Lab 09/30/23 0221 09/30/23 1106 10/01/23 0545 10/02/23 0237 10/03/23 0232  WBC 8.6 9.7 7.7 7.3 5.7  HGB 13.7 13.9 13.6 12.8* 12.5*  HCT 42.8 43.1 42.8 39.7 37.8*  MCV 88.2 85.7 86.5 87.3 87.5  PLT 235 270 275 265 282    INR: Recent Labs  Lab 09/30/23 0221 09/30/23 1057 10/01/23 0545 10/02/23 0237 10/03/23 0232  INR 2.2* 2.3* 1.4* 1.4* 1.1    Other results: EKG:    Imaging   No results found.   Medications:     Scheduled Medications:  digoxin  0.125 mg Oral Daily  feeding supplement  237 mL Oral TID WC   ivabradine  5 mg Oral BID WC   losartan  25 mg Oral BID   mexiletine  200 mg Oral BID   pantoprazole  40 mg Oral Daily   polyethylene glycol  17 g Oral Daily   senna-docusate  1 tablet Oral QHS   sertraline  25 mg Oral Daily   sorbitol  60 mL Oral Once   zinc sulfate  220 mg Oral Daily    Infusions:   ceFAZolin (ANCEF) IV 2 g (10/03/23 0313)    ceFAZolin (ANCEF) IV     heparin 500 Units/hr (10/02/23 2000)    PRN Medications: acetaminophen, albuterol, HYDROmorphone (DILAUDID) injection, oxyCODONE, traZODone  Assessment/Plan:   1. Acute Driveline Infection - Prior clinic visit 9/30 DL site was noted by VAD RN to be not completely  incorporated. Long discussion about strategies to protect DL and keep it immobilized. Reported mild itching and redness at the time. - Now admitted with driveline infection.  - CT chest/abd/pelvis showed abscess  - Initial Wound Cx + Staph Aureus - BCx NGTD  - Infectious disease consulted; Dapto transitioned to Cefazolin IV with MSSA - 10/9 debridement + wound vac, 200cc output yesterday - 10/9 Surgical wound cultures pending - Plan to return to the OR today for I&D and wound vac change.  - Hgb stable  - Oxy PRN for pain, continue stool softeners  2. 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 ? 2/2 PVCs vs genetically mediated +/- hypertension.  - LHC: normal coronaries.  - cMRI:  LV markedly dilated EF 10% RV 17% NICM - Underwent HM-III VAD on 07/28/25. Much improved NYHA I-II  - INR Goal 2.0-3.0. INR 1.1. Coumadin held for OR. Continue Heparin gtt. - Euvolemic to dry on exam - Continue digoxin 0.125 mg daily - Continue losartan 25 mg BID - Continue Ivabradine 5 BID - Strict I&O, daily weights - Imperative that he stops smoking so he may be considered for Transplant  3. PVCs - Suppressed with Mexiletine 200 mg BID - likely can stop soon  4. Tobacco use - reports quit smoking 7/24 - Cotinine level at 30.1 09/22/23  5. Hx drug abuse - Continue Suboxone   I reviewed the LVAD parameters from today, and compared the results to the patient's prior recorded data.  No programming changes were made.  The LVAD is functioning within specified parameters.  The patient performs LVAD self-test daily.  LVAD interrogation was negative for any significant power changes, alarms or PI events/speed drops.  LVAD equipment check completed and is in good working order.  Back-up equipment present.   LVAD education done on emergency procedures and precautions and reviewed exit site  care.  Length of Stay: 4  Alen Bleacher, NP 10/03/2023, 7:29 AM  VAD Team --- VAD ISSUES ONLY--- Pager [phone] (7am - 7am)  Advanced Heart Failure Team  Pager [phone] (M-F; 7a - 5p)  Please contact [facility] for night-coverage after hours (5p -7a ) and weekends on [email or web address]  Patient seen with NP, agree with the above note.   Back to OR today for wound vac change (clot formation around sponge).  Still with pain at site.   General: Well appearing this am. NAD.  HEENT: Normal. Neck: Supple, JVP 7-8 cm. Carotids OK.  Cardiac:  Mechanical heart sounds with LVAD hum present.  Lungs:  CTAB, normal effort.  Abdomen:  NT, ND, no HSM. No bruits or masses. +BS  LVAD exit site: Wound vac Extremities:  Warm and dry. No cyanosis, clubbing, rash, or edema.  Neuro:  Alert & oriented x 3. Cranial nerves grossly intact. Moves all 4 extremities w/o difficulty. Affect pleasant    Cefazolin for MSSA driveline infection.   On heparin gtt while INR subtherapeutic.   Returning to the OR ?Tuesday for I&D, vac change.   Marca Ancona 10/03/2023

## 2023-10-03 NOTE — TOC Progression Note (Addendum)
Transition of Care [facility]) - Progression Note    Patient Details  Name: Joel York MRN: [id number] Date of Birth: [birth year]  Transition of [facility]) CM/SW Contact  Nicanor Bake Phone Number: [phone] 10/03/2023, 9:07 AM  Clinical Narrative:   HF CSW called and scheduled pts Hospital follow up appointment scheduled for Monday, October 20, 2023 at 10:30 AM.   CSW attempted to meet with pt at bedside. Pt was not present in room out for a procedure.   TOC will continue following.     Expected Discharge Plan: Home w Home Health Services Barriers to Discharge: Continued Medical Work up  Expected Discharge Plan and Services   Discharge Planning Services: CM Consult Post Acute Care Choice: Home Health                                         Social Determinants of Health (SDOH) Interventions SDOH Screenings   Food Insecurity: No Food Insecurity (09/29/2023)  Housing: Low Risk  (09/29/2023)  Transportation Needs: No Transportation Needs (09/29/2023)  Utilities: Not At Risk (09/29/2023)  Alcohol Screen: Low Risk  (07/14/2023)  Financial Resource Strain: Medium Risk (08/12/2023)  Physical Activity: Sufficiently Active (07/14/2023)  Stress: Stress Concern Present (07/14/2023)  Tobacco Use: High Risk (10/03/2023)    Readmission Risk Interventions     No data to display

## 2023-10-03 NOTE — Transfer of Care (Signed)
Immediate Anesthesia Transfer of Care Note  Patient: Alessio Bogan  Procedure(s) Performed: Wound Vac Change Wound Irrigation  Patient Location: PACU  Anesthesia Type:General  Level of Consciousness: awake, alert , and oriented  Airway & Oxygen Therapy: Patient Spontanous Breathing  Post-op Assessment: Report given to RN, Post -op Vital signs reviewed and stable, and Patient moving all extremities X 4  Post vital signs: Reviewed and stable  Last Vitals:  Vitals Value Taken Time  BP 117/85 10/03/23 1226  Temp    Pulse 93 10/03/23 1229  Resp 17 10/03/23 1229  SpO2 99 % 10/03/23 1229  Vitals shown include unfiled device data.  Last Pain:  Vitals:   10/03/23 0914  TempSrc: Oral  PainSc: 3       Patients Stated Pain Goal: 0 (10/01/23 2003)  Complications: No notable events documented.

## 2023-10-03 NOTE — Op Note (Signed)
NAMEJOSEPHMICHAEL, LISENBEE MEDICAL RECORD NO: [id number] ACCOUNT NO: [id number] DATE OF BIRTH: 1986/12/11 FACILITY: [facility] LOCATION: [facility] PHYSICIAN: Kerin Perna III, MD  Operative Report   DATE OF PROCEDURE: 10/03/2023  OPERATION:   1.  Wound VAC change of VAD tunnel wound. 2.  Irrigation of wound with Vashe wound solution.  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  ANESTHESIA:  General.  DESCRIPTION OF PROCEDURE:  The patient was evaluated in preoperative holding and informed consent was documented.  He understood the rationale for this procedure to help optimize healing of the infected VAD tunnel.  The patient was taken back to the operating room by the VAD coordinator and the anesthesia team.  The VAD coordinator was present during the entire procedure to help monitor the VAD equipment and to assist with maintaining adequate hemodynamics.  The patient was positioned on the OR table and general anesthesia was induced and the patient was intubated.  The previous wound VAC sheet and sponge were removed.  The abdomen was prepped and draped as a sterile field.  A proper timeout was performed.  The wound was examined.  It was 100% clean without any purulence or necrotic tissue to remove.  Hemostasis was achieved for some superficial abdominal wall bleeding at the skin and subcutaneous tissue.  The wound was irrigated with 2 liters of Vashe  wound solution using pulse lavage.  Next, a wound VAC sponge was cut to the appropriate size and orientation and placed into the depths of the wound also encircling the power cord.  The sponge was then covered with layers of wound VAC sheeting,  which was  secured around the power cord at the lower aspect of the dressing site. Two openings were created for placement of the wound VAC VeraFlo system and the wound VAC disks were placed on those locations connected to the wound VAC pump and suction was  applied.  There was good compression of the sponge and air leak seal test was satisfactory.  The patient was then reversed from anesthesia, extubated and returned to the recovery room, where the patient was accompanied by the VAD coordinator for the stay  in the postanesthesia recovery unit.   PUS D: 10/03/2023 2:42:54 pm T: 10/03/2023 7:37:00 pm  JOB: [id number]/ [id number]

## 2023-10-03 NOTE — Brief Op Note (Signed)
10/03/2023  12:24 PM  PATIENT:  Cuyler Vandyken Minium  37 y.o. male  PRE-OPERATIVE DIAGNOSIS:  CHF, LVAD driveline infection  POST-OPERATIVE DIAGNOSIS:  CHF, LVAD driveline infection  PROCEDURE:  Procedure(s): Wound Vac Change (N/A) Wound Irrigation (N/A)  SURGEON:  Surgeons and Role:    Lovett Sox, MD - Primary  PHYSICIAN ASSISTANT: na  ASSISTANTS: none   ANESTHESIA:   general  EBL:  30 cc   BLOOD ADMINISTERED:none  DRAINS: none   LOCAL MEDICATIONS USED:  NONE  SPECIMEN:  No Specimen  DISPOSITION OF SPECIMEN:  N/A  COUNTS:  YES  TOURNIQUET:  * No tourniquets in log *  DICTATION: .Dragon Dictation  PLAN OF CARE:  return to [facility] 07  PATIENT DISPOSITION:  PACU - hemodynamically stable.   Delay start of Pharmacological VTE agent (>24hrs) due to surgical blood loss or risk of bleeding: yes Resume low dose heparin 500u/ hour at 1700.today

## 2023-10-03 NOTE — Progress Notes (Signed)
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  Pt lying in bed on my arrival. Pt had driveline washout this morning with wound vac change out. Pt is requiring IV Dilaudid for pain control.  Wound vac currently on VAC therapy. VAD coordinator switched therapy to Veraflow with 6cc of Vashe every 2 hrs with dwell time of 3 minutes. Good seal achieved at -125.  Vital signs: Temp: 98.1 HR: 81 Doppler Pressure: 90 Auto BP: 109/83 (92) O2 Sat: 98% on RA Wt: 143.3>139.1>139.5 lbs    LVAD interrogation reveals:  Speed: 5600 Flow: 4 Power: 4.3 w PI: 4.3  Alarms: none Events: 6 PI events today; 1 LF from OR Hematocrit: 38  Fixed speed: 5600 Low speed limit: 5300  Drive Line: wound vac at -125. Veraflo on with 6 cc of Vashe every 2 hrs with dwell time of 3 minutes. Anchor secure.  Labs:  LDH trend: 129>142>136>139  INR trend: 2.2>1.4>1.1  WBC trend: 8.6>7.7>7.3>5.7  Anticoagulation Plan: -INR Goal: 2-2.5 -ASA Dose: off  Blood Products:  09/30/23: 2 FFP   Infection:  09/29/23>>blood cxs x 2>> no growth x 1 days 09/29/23>>driveline cx>>MODERATE STAPHYLOCOCCUS AUREUS  10/01/23>>driveline cx OR>>pending   Adverse Events on VAD: -  Plan/Recommendations:  1. Please page VAD coordinator for any alarms or VAD equipment issues. 2. Wound VAC set to [facility] therapy, page VAD coordinator for any issues.   Carlton Adam RN,BSN VAD Coordinator  Office: [phone]  24/7 Pager: [phone]

## 2023-10-03 NOTE — Progress Notes (Signed)
Patient does not want to stand up for now dt pain. Unable to get his wt.  Pain meds given as per MAR.  Will try on later. Care ongoing

## 2023-10-03 NOTE — Anesthesia Procedure Notes (Signed)
Procedure Name: Intubation Date/Time: 10/03/2023 11:19 AM  Performed by: Nils Pyle, CRNAPre-anesthesia Checklist: Patient identified, Emergency Drugs available, Suction available and Patient being monitored Patient Re-evaluated:Patient Re-evaluated prior to induction Oxygen Delivery Method: Circle System Utilized Preoxygenation: Pre-oxygenation with 100% oxygen Induction Type: IV induction Ventilation: Mask ventilation without difficulty and Oral airway inserted - appropriate to patient size Grade View: Grade I Tube type: Oral Tube size: 7.5 mm Number of attempts: 1 Airway Equipment and Method: Stylet and Oral airway Placement Confirmation: ETT inserted through vocal cords under direct vision, positive ETCO2 and breath sounds checked- equal and bilateral Secured at: 23 cm Tube secured with: Tape Dental Injury: Teeth and Oropharynx as per pre-operative assessment

## 2023-10-04 DIAGNOSIS — Z95811 Presence of heart assist device: Secondary | ICD-10-CM

## 2023-10-04 DIAGNOSIS — T827XXA Infection and inflammatory reaction due to other cardiac and vascular devices, implants and grafts, initial encounter: Secondary | ICD-10-CM | POA: Diagnosis not present

## 2023-10-04 LAB — CBC
HCT: 33.5 % — ABNORMAL LOW (ref 39.0–52.0)
Hemoglobin: 10.7 g/dL — ABNORMAL LOW (ref 13.0–17.0)
MCH: 27.6 pg (ref 26.0–34.0)
MCHC: 31.9 g/dL (ref 30.0–36.0)
MCV: 86.6 fL (ref 80.0–100.0)
Platelets: 293 10*3/uL (ref 150–400)
RBC: 3.87 MIL/uL — ABNORMAL LOW (ref 4.22–5.81)
RDW: 13.6 % (ref 11.5–15.5)
WBC: 5.5 10*3/uL (ref 4.0–10.5)
nRBC: 0 % (ref 0.0–0.2)

## 2023-10-04 LAB — TYPE AND SCREEN
ABO/RH(D): AB POS
Antibody Screen: NEGATIVE
Unit division: 0
Unit division: 0
Unit division: 0

## 2023-10-04 LAB — CULTURE, BLOOD (SINGLE)
Culture: NO GROWTH
Special Requests: ADEQUATE

## 2023-10-04 LAB — BASIC METABOLIC PANEL
Anion gap: 10 (ref 5–15)
BUN: 14 mg/dL (ref 6–20)
CO2: 27 mmol/L (ref 22–32)
Calcium: 9.1 mg/dL (ref 8.9–10.3)
Chloride: 100 mmol/L (ref 98–111)
Creatinine, Ser: 0.72 mg/dL (ref 0.61–1.24)
GFR, Estimated: >60 mL/min (ref >60)
Glucose, Bld: 149 mg/dL — ABNORMAL HIGH (ref 70–99)
Potassium: 3.9 mmol/L (ref 3.5–5.1)
Sodium: 137 mmol/L (ref 135–145)

## 2023-10-04 LAB — BPAM RBC
Blood Product Expiration Date: 202411052359
Blood Product Expiration Date: 202411052359
Blood Product Expiration Date: 202411052359
ISSUE DATE / TIME: 202410101653
Unit Type and Rh: 6200
Unit Type and Rh: 6200
Unit Type and Rh: 6200

## 2023-10-04 LAB — CULTURE, BLOOD (ROUTINE X 2)
Culture: NO GROWTH
Special Requests: ADEQUATE

## 2023-10-04 LAB — HEPARIN LEVEL (UNFRACTIONATED)
Heparin Unfractionated: 0.68 [IU]/mL (ref 0.30–0.70)
Heparin Unfractionated: <0.1 [IU]/mL — ABNORMAL LOW (ref 0.30–0.70)

## 2023-10-04 LAB — LACTATE DEHYDROGENASE: LDH: 121 U/L (ref 98–192)

## 2023-10-04 LAB — PROTIME-INR
INR: 1.1 (ref 0.8–1.2)
Prothrombin Time: 14.7 s (ref 11.4–15.2)

## 2023-10-04 LAB — ACID FAST SMEAR (AFB, MYCOBACTERIA): Acid Fast Smear: NEGATIVE

## 2023-10-04 MED ORDER — WARFARIN - PHARMACIST DOSING INPATIENT: Freq: Every day | Status: DC

## 2023-10-04 MED ORDER — SORBITOL 70 % SOLN
60.0000 mL | Freq: Every day | Status: DC | PRN
  Administered 2023-10-04: 60 mL via ORAL
  Filled 2023-10-04: qty 60

## 2023-10-04 MED ORDER — WARFARIN SODIUM 2 MG PO TABS
2.0000 mg | ORAL_TABLET | Freq: Once | ORAL | Status: AC
  Administered 2023-10-04: 2 mg via ORAL
  Filled 2023-10-04: qty 1

## 2023-10-04 NOTE — Progress Notes (Signed)
Advanced Heart Failure VAD Team Note  PCP-Cardiologist: None   Subjective:   09/29/23: admitted with DL infection. CT A/P with fluid collection around driveline suggestive of abscess. Wound Cx + Staph Aureus  09/30/23: developed significant bleeding from DL site>>given FFP 78/2/95: I/D with CTS with wound vac placement\ 10/03/23: VAC change  Surgical wound cultures 10/9 with MSSA. On cefazolin   Afebrile. VAC working well. C/o pain at wound vac site  MAPs stable   LVAD INTERROGATION:  HeartMate II LVAD:   Flow 3.9 liters/min, speed 5600, power 4.0, PI 3.4 VAD interrogated personally. Parameters stable.  Objective:    Vital Signs:   Temp:  [97.9 F (36.6 C)-98.5 F (36.9 C)] 97.9 F (36.6 C) (10/12 1108) Pulse Rate:  [61-76] 76 (10/12 1108) Resp:  [10-17] 10 (10/12 0627) BP: (90-104)/(61-76) 90/69 (10/12 1108) SpO2:  [98 %] 98 % (10/12 0400) Weight:  [63.7 kg] 63.7 kg (10/12 0627) Last BM Date : 09/30/23 VAD Mean arterial Pressure 70s  Intake/Output:   Intake/Output Summary (Last 24 hours) at 10/04/2023 1456 Last data filed at 10/04/2023 1112 Gross per 24 hour  Intake 795.85 ml  Output 2000 ml  Net -1204.15 ml     Physical Exam   General:  NAD.  HEENT: normal  Neck: supple. JVP not elevated.  Carotids 2+ bilat; no bruits. No lymphadenopathy or thryomegaly appreciated. Cor: LVAD hum.  Lungs: Clear. Abdomen: obese soft, nontender, non-distended. No hepatosplenomegaly. No bruits or masses. Good bowel sounds. Driveline site with wound vac in place. Good seal  Extremities: no cyanosis, clubbing, rash. Warm no edema  Neuro: alert & oriented x 3. No focal deficits. Moves all 4 without problem    Telemetry   NSR 70-80s (Personally reviewed)    Labs   Basic Metabolic Panel: Recent Labs  Lab 09/30/23 0221 09/30/23 0905 10/01/23 0545 10/02/23 0237 10/03/23 0232 10/04/23 0424  NA 138 139 138 138 140 137  K 3.8 4.5 4.4 4.1 3.6 3.9  CL 98 98 101 99 98 100   CO2 31 27 29 29 29 27   GLUCOSE 97 85 96 99 94 149*  BUN 10 9 9 17 14 14   CREATININE 0.91 0.93 0.90 0.96 0.76 0.72  CALCIUM 8.9 9.2 9.4 9.4 9.0 9.1  MG 1.8 1.8  --   --   --   --     Liver Function Tests: Recent Labs  Lab 09/30/23 0905  AST 18  ALT 13  ALKPHOS 132*  BILITOT 0.3  PROT 6.8  ALBUMIN 3.3*   No results for input(s): "LIPASE", "AMYLASE" in the last 168 hours. No results for input(s): "AMMONIA" in the last 168 hours.  CBC: Recent Labs  Lab 09/30/23 1106 10/01/23 0545 10/02/23 0237 10/03/23 0232 10/04/23 0424  WBC 9.7 7.7 7.3 5.7 5.5  HGB 13.9 13.6 12.8* 12.5* 10.7*  HCT 43.1 42.8 39.7 37.8* 33.5*  MCV 85.7 86.5 87.3 87.5 86.6  PLT 270 275 265 282 293    INR: Recent Labs  Lab 09/30/23 1057 10/01/23 0545 10/02/23 0237 10/03/23 0232 10/04/23 0424  INR 2.3* 1.4* 1.4* 1.1 1.1    Other results:    Imaging   [place] EKG SITE RITE  Result Date: 10/03/2023 If Site Rite image not attached, placement could not be confirmed due to current cardiac rhythm.    Medications:     Scheduled Medications:  digoxin  0.125 mg Oral Daily   feeding supplement  237 mL Oral TID WC   ivabradine  5  mg Oral BID WC   losartan  25 mg Oral BID   mexiletine  200 mg Oral BID   pantoprazole  40 mg Oral Daily   polyethylene glycol  17 g Oral Daily   senna-docusate  1 tablet Oral QHS   sertraline  25 mg Oral Daily   sodium chloride flush  10-40 mL Intracatheter Q12H   zinc sulfate  220 mg Oral Daily    Infusions:   ceFAZolin (ANCEF) IV 2 g (10/04/23 1128)   heparin 500 Units/hr (10/03/23 1741)    PRN Medications: acetaminophen, albuterol, HYDROmorphone (DILAUDID) injection, mouth rinse, oxyCODONE, sodium chloride flush, sorbitol, traZODone  Assessment/Plan:   1. Acute Driveline Infection - Prior clinic visit 9/30 DL site was noted by VAD RN to be not completely incorporated. Long discussion about strategies to protect DL and keep it immobilized. Reported mild  itching and redness at the time. - Now admitted with driveline infection.  - CT chest/abd/pelvis showed abscess  - Wound Cx + Staph Aureus - BCx NGTD  - ID consulted: Dapto transitioned to Cefazolin IV with MSSA - 10/9 debridement + wound vac, 200cc output yesterday - 10/11 wound vac changed - Return to OR for VAC change 10/15 - Continue pain regimen - Oxy PRN for pain, continue stool softeners  2. 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 ? 2/2 PVCs vs genetically mediated +/- hypertension.  - LHC: normal coronaries.  - cMRI:  LV markedly dilated EF 10% RV 17% NICM - Underwent HM-III VAD on 07/28/25. Much improved NYHA I-II  - INR Goal 2.0-3.0. INR 1.1 Warfarin held for OR. Continue Heparin gtt. No bleeding - D/w Dr. Norina Buzzard and PharmD. Resume warfarin - Volume ok  - Continue digoxin 0.125 mg daily - Continue losartan 25 mg BID - Continue Ivabradine 5 BID - Strict I&O, daily weights - Imperative that he stops smoking so he may be considered for Transplant  3. PVCs - Suppressed with Mexiletine 200 mg BID - likely can stop soon  4. Tobacco use - reports quit smoking 7/24 - Cotinine level at 30.1 09/22/23  5. Hx drug abuse - Continue Suboxone   I reviewed the LVAD parameters from today, and compared the results to the patient's prior recorded data.  No programming changes were made.  The LVAD is functioning within specified parameters.  The patient performs LVAD self-test daily.  LVAD interrogation was negative for any significant power changes, alarms or PI events/speed drops.  LVAD equipment check completed and is in good working order.  Back-up equipment present.   LVAD education done on emergency procedures and precautions and reviewed exit site care.  Length of Stay: 5  Joel Meres, MD 10/04/2023, 2:56 PM  VAD Team --- VAD ISSUES ONLY--- Pager [phone]  (7am - 7am)  Advanced Heart Failure Team  Pager [phone] (M-F; 7a - 5p)  Please contact [facility] for night-coverage after hours (5p -7a ) and weekends on [email or web address]

## 2023-10-04 NOTE — Progress Notes (Signed)
PHARMACY - ANTICOAGULATION CONSULT NOTE  Pharmacy Consult for Heparin infusion + warfarin  Indication:  LVAD  Allergies  Allergen Reactions   Other Anaphylaxis    Tree Nuts   Peanuts [Peanut Oil] Anaphylaxis   Chlorhexidine Rash    Patient Measurements: Height: 5\' 7"  (170.2 cm) Weight: 63.7 kg (140 lb 6.9 oz) IBW/kg (Calculated) : 66.1 Heparin Dosing Weight: 63.1 kg  Vital Signs: Temp: 97.9 F (36.6 C) (10/12 1519) Temp Source: Oral (10/12 1519) BP: 102/77 (10/12 1519) Pulse Rate: 75 (10/12 1519)  Labs: Recent Labs    10/02/23 0237 10/02/23 1340 10/03/23 0232 10/04/23 0424  HGB 12.8*  --  12.5* 10.7*  HCT 39.7  --  37.8* 33.5*  PLT 265  --  282 293  LABPROT 17.0*  --  14.4 14.7  INR 1.4*  --  1.1 1.1  HEPARINUNFRC  --  <0.10* <0.10* 0.68  CREATININE 0.96  --  0.76 0.72    Estimated Creatinine Clearance: 115 mL/min (by C-G formula based on SCr of 0.72 mg/dL).   Medical History: Past Medical History:  Diagnosis Date   Acid reflux    ADHD (attention deficit hyperactivity disorder)    Asthma    Back pain    Seizures (HCC)    Resolved    Medications:  Infusions:    ceFAZolin (ANCEF) IV 2 g (10/04/23 1128)   heparin 500 Units/hr (10/03/23 1741)    Assessment: 37 yo male with LVAD, admitted with driveline infection. On warfarin PTA - PTA regimen is 2mg  daily except 3 mg MF. INR on admit therapeutic at 2.  Now INR 1.1- after holding warfarin and  FFP. Given INR<1.8, started on heparin infusion at 500 units/hr. Hgb 12.8, plt 265. LDH 136. Had evidence of blood retained in sponge of wound VAC upon CTVS exam. No infusion issues. S/p I&D 10/9,10/11 and plan for wash out next week.  Discussion with MD today ok to restart warfarin low dose and keep INR about 1.5 until procedures complete Heparin 500 uts/hr drawn from central line where heparin is running - false high this am - will obtain peripheral stick and move heparin to PIV so labs can be accurately drawn  from CL  Goal of Therapy:  Heparin level 0.2-0.3 units/ml Monitor platelets by anticoagulation protocol: Yes   Plan:  Continue heparin 500 units/hr - Warfarin 2mg  x1 today  Monitor daily CBC, protime and heparin level, and for s/sx of bleeding    Leota Sauers Pharm.D. CPP, BCPS Clinical Pharmacist [phone] 10/04/2023 4:28 PM    Please check AMION for all [facility] phone numbers After 10:00 PM, call Main Pharmacy [phone]

## 2023-10-04 NOTE — Progress Notes (Signed)
1 Day Post-Op Procedure(s) (LRB): Wound Vac Change (N/A) Wound Irrigation (N/A) Subjective: Pain well controlled Taking more po, Ensure VAC now in Veriflow mode with VASHE instillation, working ok without alarms. Hct stable 33 % Wound VAC sponge compressed  Objective: Vital signs in last 24 hours: Temp:  [98 F (36.7 C)-98.5 F (36.9 C)] 98 F (36.7 C) (10/12 0737) Pulse Rate:  [61-96] 71 (10/12 0737) Cardiac Rhythm: Normal sinus rhythm (10/12 0700) Resp:  [10-17] 10 (10/12 0627) BP: (91-117)/(61-85) 104/70 (10/12 0737) SpO2:  [94 %-99 %] 98 % (10/12 0400) Weight:  [63.7 kg] 63.7 kg (10/12 0627)  Hemodynamic parameters for last 24 hours:   Afebrile NSR Intake/Output from previous day: 10/11 0701 - 10/12 0700 In: 1577.8 [P.O.:120; I.V.:234.4; IV Piggyback:1223.5] Out: 1650 [Urine:1500; Drains:100; Blood:50] Intake/Output this shift: Total I/O In: 240 [P.O.:240] Out: -   EXAM  Blood pressure 104/70, pulse 71, temperature 98 F (36.7 C), temperature source Oral, resp. rate 10, height 5\' 7"  (1.702 m), weight 63.7 kg, SpO2 98%.   Abd non tender Normal VAD flow Lungs clear  Lab Results: Recent Labs    10/03/23 0232 10/04/23 0424  WBC 5.7 5.5  HGB 12.5* 10.7*  HCT 37.8* 33.5*  PLT 282 293   BMET:  Recent Labs    10/03/23 0232 10/04/23 0424  NA 140 137  K 3.6 3.9  CL 98 100  CO2 29 27  GLUCOSE 94 149*  BUN 14 14  CREATININE 0.76 0.72  CALCIUM 9.0 9.1    PT/INR:  Recent Labs    10/04/23 0424  LABPROT 14.7  INR 1.1   ABG    Component Value Date/Time   PHART 7.510 (H) 08/03/2023 0445   HCO3 27.3 08/03/2023 0445   TCO2 28 08/03/2023 0445   ACIDBASEDEF 2.0 07/31/2023 1704   O2SAT 68.1 08/08/2023 0535   CBG (last 3)  No results for input(s): "GLUCAP" in the last 72 hours.  Assessment/Plan: S/P Procedure(s) (LRB): Wound Vac Change (N/A) Wound Irrigation (N/A) MSSA infection of VAD tunnel  cont wound VAC Veriflow  with Vashe IV  ancef Heparin 500 u/hr and slowly resume coumadin as wound shouldn't need more excisional debridement Return to OR for VAC change 10-15    LOS: 5 days    Lovett Sox 10/04/2023

## 2023-10-05 DIAGNOSIS — Z95811 Presence of heart assist device: Secondary | ICD-10-CM | POA: Diagnosis not present

## 2023-10-05 DIAGNOSIS — T827XXA Infection and inflammatory reaction due to other cardiac and vascular devices, implants and grafts, initial encounter: Secondary | ICD-10-CM | POA: Diagnosis not present

## 2023-10-05 LAB — CBC
HCT: 36.7 % — ABNORMAL LOW (ref 39.0–52.0)
Hemoglobin: 11.9 g/dL — ABNORMAL LOW (ref 13.0–17.0)
MCH: 28.8 pg (ref 26.0–34.0)
MCHC: 32.4 g/dL (ref 30.0–36.0)
MCV: 88.9 fL (ref 80.0–100.0)
Platelets: 270 10*3/uL (ref 150–400)
RBC: 4.13 MIL/uL — ABNORMAL LOW (ref 4.22–5.81)
RDW: 14 % (ref 11.5–15.5)
WBC: 5.7 10*3/uL (ref 4.0–10.5)
nRBC: 0 % (ref 0.0–0.2)

## 2023-10-05 LAB — BASIC METABOLIC PANEL
Anion gap: 8 (ref 5–15)
BUN: 12 mg/dL (ref 6–20)
CO2: 31 mmol/L (ref 22–32)
Calcium: 9.1 mg/dL (ref 8.9–10.3)
Chloride: 99 mmol/L (ref 98–111)
Creatinine, Ser: 0.82 mg/dL (ref 0.61–1.24)
GFR, Estimated: >60 mL/min (ref >60)
Glucose, Bld: 88 mg/dL (ref 70–99)
Potassium: 4 mmol/L (ref 3.5–5.1)
Sodium: 138 mmol/L (ref 135–145)

## 2023-10-05 LAB — HEPARIN LEVEL (UNFRACTIONATED): Heparin Unfractionated: <0.1 [IU]/mL — ABNORMAL LOW (ref 0.30–0.70)

## 2023-10-05 LAB — PROTIME-INR
INR: 1.1 (ref 0.8–1.2)
Prothrombin Time: 14.1 s (ref 11.4–15.2)

## 2023-10-05 LAB — LACTATE DEHYDROGENASE: LDH: 121 U/L (ref 98–192)

## 2023-10-05 MED ORDER — HYDROMORPHONE HCL 1 MG/ML IJ SOLN
2.0000 mg | INTRAMUSCULAR | Status: DC | PRN
  Administered 2023-10-05 – 2023-10-08 (×14): 2 mg via INTRAVENOUS
  Filled 2023-10-05 (×15): qty 2

## 2023-10-05 MED ORDER — WARFARIN SODIUM 2 MG PO TABS
2.0000 mg | ORAL_TABLET | Freq: Once | ORAL | Status: AC
  Administered 2023-10-05: 2 mg via ORAL
  Filled 2023-10-05: qty 1

## 2023-10-05 MED ORDER — TRAMADOL HCL 50 MG PO TABS
100.0000 mg | ORAL_TABLET | Freq: Three times a day (TID) | ORAL | Status: DC
  Administered 2023-10-05 – 2023-10-06 (×5): 100 mg via ORAL
  Filled 2023-10-05 (×5): qty 2

## 2023-10-05 NOTE — Progress Notes (Signed)
PHARMACY - ANTICOAGULATION CONSULT NOTE  Pharmacy Consult for Heparin infusion + warfarin  Indication:  LVAD  Allergies  Allergen Reactions   Other Anaphylaxis    Tree Nuts   Peanuts [Peanut Oil] Anaphylaxis   Chlorhexidine Rash    Patient Measurements: Height: 5\' 7"  (170.2 cm) Weight: 63.5 kg (139 lb 15.9 oz) IBW/kg (Calculated) : 66.1 Heparin Dosing Weight: 63.1 kg  Vital Signs: Temp: 97.8 F (36.6 C) (10/13 1512) Temp Source: Oral (10/13 1512) BP: 88/64 (10/13 1512) Pulse Rate: 72 (10/13 1512)  Labs: Recent Labs    10/03/23 0232 10/04/23 0424 10/04/23 1809 10/05/23 0609 10/05/23 0611  HGB 12.5* 10.7*  --  11.9*  --   HCT 37.8* 33.5*  --  36.7*  --   PLT 282 293  --  270  --   LABPROT 14.4 14.7  --  14.1  --   INR 1.1 1.1  --  1.1  --   HEPARINUNFRC <0.10* 0.68 <0.10*  --  <0.10*  CREATININE 0.76 0.72  --  0.82  --     Estimated Creatinine Clearance: 111.9 mL/min (by C-G formula based on SCr of 0.82 mg/dL).   Medical History: Past Medical History:  Diagnosis Date   Acid reflux    ADHD (attention deficit hyperactivity disorder)    Asthma    Back pain    Seizures (HCC)    Resolved    Medications:  Infusions:    ceFAZolin (ANCEF) IV 2 g (10/05/23 1100)   heparin 500 Units/hr (10/05/23 0324)    Assessment: 37 yo male with LVAD, admitted with driveline infection. On warfarin PTA - PTA regimen is 2mg  daily except 3 mg MF. INR on admit therapeutic at 2.  INR 1.1- after holding warfarin and  FFP. - restart but keep INR at 1.5 while on going I&D and VAC  changes - cbc stable LDH stable 100s Given INR<1.8, started on heparin infusion at 500 units/hr - low fix rate heparin level 0.1 - no up titration.   Goal of Therapy:  Heparin level 0.2-0.3 units/ml Monitor platelets by anticoagulation protocol: Yes   Plan:  Continue heparin 500 units/hr - Warfarin 2mg  x1 today  Monitor daily CBC, protime and heparin level, and for s/sx of bleeding    Leota Sauers Pharm.D. CPP, BCPS Clinical Pharmacist [phone] 10/05/2023 3:15 PM    Please check AMION for all [facility] phone numbers After 10:00 PM, call Main Pharmacy [phone]

## 2023-10-05 NOTE — Progress Notes (Signed)
Advanced Heart Failure VAD Team Note  PCP-Cardiologist: None   Subjective:   09/29/23: admitted with DL infection. CT A/P with fluid collection around driveline suggestive of abscess. Wound Cx + Staph Aureus  09/30/23: developed significant bleeding from DL site>>given FFP 16/1/09: I/D with CTS with wound vac placement\ 10/03/23: VAC change  Surgical wound cultures 10/9 with MSSA. On cefazolin   Afebrile. VAC working well but is having some leaking around dressing   Still with site pain. Requiring 2mg  dilaudid q2hr   Had BM last night.   No CP or SOB.  INR 1.1 on heparin 500u/hr  LVAD INTERROGATION:  HeartMate II LVAD:   Flow 3.7 liters/min, speed 5700, power 4.0, PI 5.2 VAD interrogated personally. Parameters stable.  Objective:    Vital Signs:   Temp:  [97.6 F (36.4 C)-97.9 F (36.6 C)] 97.9 F (36.6 C) (10/13 1107) Pulse Rate:  [71-80] 75 (10/13 1107) Resp:  [10-13] 10 (10/13 0500) BP: (81-107)/(56-90) 84/56 (10/13 1107) SpO2:  [94 %-98 %] 95 % (10/13 0355) Weight:  [63.5 kg] 63.5 kg (10/13 0500) Last BM Date : 09/30/23 VAD Mean arterial Pressure 60-70s  Intake/Output:   Intake/Output Summary (Last 24 hours) at 10/05/2023 1511 Last data filed at 10/05/2023 0634 Gross per 24 hour  Intake 554.16 ml  Output 1425 ml  Net -870.84 ml     Physical Exam   General:  NAD.  HEENT: normal  Neck: supple. JVP not elevated.  Carotids 2+ bilat; no bruits. No lymphadenopathy or thryomegaly appreciated. Cor: LVAD hum.  Lungs: Clear. Abdomen:  soft, nontender, non-distended. No hepatosplenomegaly. No bruits or masses. Good bowel sounds. Driveline site with wound vac Anchor in place.  Extremities: no cyanosis, clubbing, rash. Warm no edema  Neuro: alert & oriented x 3. No focal deficits. Moves all 4 without problem    Telemetry   NSR 70-80s (Personally reviewed)    Labs   Basic Metabolic Panel: Recent Labs  Lab 09/30/23 0221 09/30/23 0905 10/01/23 0545  10/02/23 0237 10/03/23 0232 10/04/23 0424 10/05/23 0609  NA 138 139 138 138 140 137 138  K 3.8 4.5 4.4 4.1 3.6 3.9 4.0  CL 98 98 101 99 98 100 99  CO2 31 27 29 29 29 27 31   GLUCOSE 97 85 96 99 94 149* 88  BUN 10 9 9 17 14 14 12   CREATININE 0.91 0.93 0.90 0.96 0.76 0.72 0.82  CALCIUM 8.9 9.2 9.4 9.4 9.0 9.1 9.1  MG 1.8 1.8  --   --   --   --   --     Liver Function Tests: Recent Labs  Lab 09/30/23 0905  AST 18  ALT 13  ALKPHOS 132*  BILITOT 0.3  PROT 6.8  ALBUMIN 3.3*   No results for input(s): "LIPASE", "AMYLASE" in the last 168 hours. No results for input(s): "AMMONIA" in the last 168 hours.  CBC: Recent Labs  Lab 10/01/23 0545 10/02/23 0237 10/03/23 0232 10/04/23 0424 10/05/23 0609  WBC 7.7 7.3 5.7 5.5 5.7  HGB 13.6 12.8* 12.5* 10.7* 11.9*  HCT 42.8 39.7 37.8* 33.5* 36.7*  MCV 86.5 87.3 87.5 86.6 88.9  PLT 275 265 282 293 270    INR: Recent Labs  Lab 10/01/23 0545 10/02/23 0237 10/03/23 0232 10/04/23 0424 10/05/23 0609  INR 1.4* 1.4* 1.1 1.1 1.1    Other results:    Imaging   No results found.   Medications:     Scheduled Medications:  digoxin  0.125 mg Oral  Daily   feeding supplement  237 mL Oral TID WC   ivabradine  5 mg Oral BID WC   losartan  25 mg Oral BID   mexiletine  200 mg Oral BID   pantoprazole  40 mg Oral Daily   polyethylene glycol  17 g Oral Daily   senna-docusate  1 tablet Oral QHS   sertraline  25 mg Oral Daily   sodium chloride flush  10-40 mL Intracatheter Q12H   traMADol  100 mg Oral TID   warfarin  2 mg Oral ONCE-1600   Warfarin - Pharmacist Dosing Inpatient   Does not apply q1600   zinc sulfate  220 mg Oral Daily    Infusions:   ceFAZolin (ANCEF) IV 2 g (10/05/23 1100)   heparin 500 Units/hr (10/05/23 0324)    PRN Medications: acetaminophen, albuterol, HYDROmorphone (DILAUDID) injection, mouth rinse, oxyCODONE, sodium chloride flush, sorbitol, traZODone  Assessment/Plan:   1. Acute Driveline  Infection - Prior clinic visit 9/30 DL site was noted by VAD RN to be not completely incorporated. Long discussion about strategies to protect DL and keep it immobilized. Reported mild itching and redness at the time. - Now admitted with driveline infection.  - CT chest/abd/pelvis showed abscess  - Wound Cx + Staph Aureus - BCx NGTD  - ID consulted: Dapto transitioned to Cefazolin IV with MSSA - 10/9 debridement + wound vac, 200cc output yesterday - 10/11 wound vac changed - Return to OR for VAC change 10/15 - Will drop dilaudid to q4hr add tramadol 100 tid for baseline control  - Continue cefazolin  2. 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 ? 2/2 PVCs vs genetically mediated +/- hypertension.  - LHC: normal coronaries.  - cMRI:  LV markedly dilated EF 10% RV 17% NICM - Underwent HM-III VAD on 07/28/25. Much improved NYHA I-II  - INR Goal 2.0-3.0. Warfarin held for OR but restarted 10/12. Continue Heparin gtt. No bleeding. Continue warfarin. Discussed warfarin dosing with PharmD personally. - Volume ok. MAPs a bit soft. Follow 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 Tobacco use - reports quit smoking 7/24 - Cotinine level at 30.1 09/22/23  5. Hx drug abuse - Continue suboxone as outpatient   I reviewed the LVAD parameters from today, and compared the results to the patient's prior recorded data.  No programming changes were made.  The LVAD is functioning within specified parameters.  The patient performs LVAD self-test daily.  LVAD interrogation was negative for any significant power changes, alarms or PI events/speed drops.  LVAD equipment check completed and is in good working order.  Back-up equipment present.   LVAD education done on  emergency procedures and precautions and reviewed exit site care.  Length of Stay: 6  [name], MD 10/05/2023, 3:11 PM  VAD Team --- VAD ISSUES ONLY--- Pager [phone] (7am - 7am)  Advanced Heart Failure Team  Pager [phone] (M-F; 7a - 5p)  Please contact [facility] for night-coverage after hours (5p -7a ) and weekends on [email or web address]

## 2023-10-05 NOTE — Plan of Care (Signed)
  Problem: Education: Goal: Understanding of CV disease, CV risk reduction, and recovery process will improve Outcome: Progressing Goal: Individualized Educational Video(s) Outcome: Progressing   Problem: Activity: Goal: Ability to return to baseline activity level will improve Outcome: Progressing   Problem: Cardiovascular: Goal: Ability to achieve and maintain adequate cardiovascular perfusion will improve Outcome: Progressing Goal: Vascular access site(s) Level 0-1 will be maintained Outcome: Progressing   Problem: Health Behavior/Discharge Planning: Goal: Ability to safely manage health-related needs after discharge will improve Outcome: Progressing   Problem: Education: Goal: Patient will understand all VAD equipment and how it functions Outcome: Progressing Goal: Patient will be able to verbalize current INR target range and antiplatelet therapy for discharge home Outcome: Progressing   Problem: Cardiac: Goal: LVAD will function as expected and patient will experience no clinical alarms Outcome: Progressing   Problem: Education: Goal: Knowledge of General Education information will improve Description: Including pain rating scale, medication(s)/side effects and non-pharmacologic comfort measures Outcome: Progressing   Problem: Health Behavior/Discharge Planning: Goal: Ability to manage health-related needs will improve Outcome: Progressing   Problem: Clinical Measurements: Goal: Ability to maintain clinical measurements within normal limits will improve Outcome: Progressing Goal: Will remain free from infection Outcome: Progressing Goal: Diagnostic test results will improve Outcome: Progressing Goal: Respiratory complications will improve Outcome: Progressing Goal: Cardiovascular complication will be avoided Outcome: Progressing   Problem: Activity: Goal: Risk for activity intolerance will decrease Outcome: Progressing   Problem: Nutrition: Goal: Adequate  nutrition will be maintained Outcome: Progressing   Problem: Coping: Goal: Level of anxiety will decrease Outcome: Progressing   Problem: Elimination: Goal: Will not experience complications related to bowel motility Outcome: Progressing Goal: Will not experience complications related to urinary retention Outcome: Progressing   Problem: Pain Managment: Goal: General experience of comfort will improve Outcome: Progressing   Problem: Safety: Goal: Ability to remain free from injury will improve Outcome: Progressing   Problem: Skin Integrity: Goal: Risk for impaired skin integrity will decrease Outcome: Progressing

## 2023-10-06 ENCOUNTER — Encounter (HOSPITAL_COMMUNITY): Payer: Self-pay | Admitting: Cardiothoracic Surgery

## 2023-10-06 DIAGNOSIS — Z95811 Presence of heart assist device: Secondary | ICD-10-CM | POA: Diagnosis not present

## 2023-10-06 DIAGNOSIS — T827XXA Infection and inflammatory reaction due to other cardiac and vascular devices, implants and grafts, initial encounter: Secondary | ICD-10-CM | POA: Diagnosis not present

## 2023-10-06 LAB — AEROBIC/ANAEROBIC CULTURE W GRAM STAIN (SURGICAL/DEEP WOUND)

## 2023-10-06 LAB — CBC
HCT: 36.5 % — ABNORMAL LOW (ref 39.0–52.0)
Hemoglobin: 11.8 g/dL — ABNORMAL LOW (ref 13.0–17.0)
MCH: 28.7 pg (ref 26.0–34.0)
MCHC: 32.3 g/dL (ref 30.0–36.0)
MCV: 88.8 fL (ref 80.0–100.0)
Platelets: 283 10*3/uL (ref 150–400)
RBC: 4.11 MIL/uL — ABNORMAL LOW (ref 4.22–5.81)
RDW: 13.8 % (ref 11.5–15.5)
WBC: 6.1 10*3/uL (ref 4.0–10.5)
nRBC: 0 % (ref 0.0–0.2)

## 2023-10-06 LAB — BASIC METABOLIC PANEL
Anion gap: 10 (ref 5–15)
BUN: 14 mg/dL (ref 6–20)
CO2: 28 mmol/L (ref 22–32)
Calcium: 8.8 mg/dL — ABNORMAL LOW (ref 8.9–10.3)
Chloride: 103 mmol/L (ref 98–111)
Creatinine, Ser: 1.04 mg/dL (ref 0.61–1.24)
GFR, Estimated: >60 mL/min (ref >60)
Glucose, Bld: 117 mg/dL — ABNORMAL HIGH (ref 70–99)
Potassium: 3.7 mmol/L (ref 3.5–5.1)
Sodium: 141 mmol/L (ref 135–145)

## 2023-10-06 LAB — LACTATE DEHYDROGENASE: LDH: 155 U/L (ref 98–192)

## 2023-10-06 LAB — HEPARIN LEVEL (UNFRACTIONATED): Heparin Unfractionated: <0.1 [IU]/mL — ABNORMAL LOW (ref 0.30–0.70)

## 2023-10-06 LAB — TYPE AND SCREEN
ABO/RH(D): AB POS
Antibody Screen: NEGATIVE

## 2023-10-06 LAB — PROTIME-INR
INR: 1.1 (ref 0.8–1.2)
Prothrombin Time: 14.8 s (ref 11.4–15.2)

## 2023-10-06 MED ORDER — WARFARIN SODIUM 2 MG PO TABS
2.0000 mg | ORAL_TABLET | Freq: Once | ORAL | Status: AC
  Administered 2023-10-06: 2 mg via ORAL
  Filled 2023-10-06: qty 1

## 2023-10-06 MED ORDER — POTASSIUM CHLORIDE CRYS ER 20 MEQ PO TBCR
20.0000 meq | EXTENDED_RELEASE_TABLET | Freq: Once | ORAL | Status: AC
  Administered 2023-10-06: 20 meq via ORAL
  Filled 2023-10-06 (×2): qty 1

## 2023-10-06 MED ORDER — SENNOSIDES-DOCUSATE SODIUM 8.6-50 MG PO TABS
1.0000 | ORAL_TABLET | Freq: Two times a day (BID) | ORAL | Status: DC
  Administered 2023-10-06 (×2): 1 via ORAL
  Filled 2023-10-06 (×4): qty 1

## 2023-10-06 NOTE — Progress Notes (Signed)
PHARMACY - ANTICOAGULATION CONSULT NOTE  Pharmacy Consult for Heparin infusion + warfarin  Indication:  LVAD  Allergies  Allergen Reactions   Other Anaphylaxis    Tree Nuts   Peanuts [Peanut Oil] Anaphylaxis   Chlorhexidine Rash    Patient Measurements: Height: 5\' 7"  (170.2 cm) Weight: 64.1 kg (141 lb 4.8 oz) IBW/kg (Calculated) : 66.1 Heparin Dosing Weight: 63.1 kg  Vital Signs: Temp: 98.2 F (36.8 C) (10/14 0742) Temp Source: Oral (10/14 0742) BP: 119/87 (10/14 0742) Pulse Rate: 73 (10/14 0742)  Labs: Recent Labs    10/04/23 0424 10/04/23 1809 10/05/23 0609 10/05/23 0611 10/06/23 0500  HGB 10.7*  --  11.9*  --  11.8*  HCT 33.5*  --  36.7*  --  36.5*  PLT 293  --  270  --  283  LABPROT 14.7  --  14.1  --  14.8  INR 1.1  --  1.1  --  1.1  HEPARINUNFRC 0.68 <0.10*  --  <0.10* <0.10*  CREATININE 0.72  --  0.82  --  1.04    Estimated Creatinine Clearance: 89 mL/min (by C-G formula based on SCr of 1.04 mg/dL).   Medical History: Past Medical History:  Diagnosis Date   Acid reflux    ADHD (attention deficit hyperactivity disorder)    Asthma    Back pain    Seizures (HCC)    Resolved    Medications:  Infusions:    ceFAZolin (ANCEF) IV 200 mL/hr at 10/06/23 0506   heparin 500 Units/hr (10/06/23 0506)    Assessment: 37 yo male with LVAD, admitted with driveline infection. On warfarin PTA - PTA regimen is 2mg  daily except 3 mg MF. INR on admit therapeutic at 2.  INR is low at 1.1, on heparin infusion at 500 units/hr (no titration). Hgb 11.8, plt 283, LDH 155. Aiming to keep INR at 1.5 while on going I&D and VAC changes.   Goal of Therapy: INR 2-2.5  Heparin level 0.2-0.3 units/ml Monitor platelets by anticoagulation protocol: Yes   Plan:  Continue heparin 500 units/hr  Warfarin 2mg  x1 tonight  Monitor daily CBC, protime and heparin level, and for s/sx of bleeding  Thank you for allowing pharmacy to participate in this patient's care,  Sherron Monday, PharmD, BCCCP Clinical Pharmacist  Phone: [phone] 10/06/2023 10:13 AM  Please check AMION for all [facility] Pharmacy phone numbers After 10:00 PM, call Main Pharmacy [phone]

## 2023-10-06 NOTE — Progress Notes (Signed)
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  Pt lying in bed on my arrival. Denies complaints. States pain is well controlled. Plan to return to OR tomorrow for washout/wound vac change with Dr Donata Clay.   Wound vac currently on Veraflow therapy with 6cc of Vashe every 2 hrs with dwell time of 3 minutes. Over the weekend experienced leak alarm. Bedside RN reinforced outer dressing with good seal achieved at -125. No further alarms noted.   Vital signs: Temp: 98.2 HR: 73 Doppler Pressure: 98 Auto BP: 119/87 (99) O2 Sat: 98% on RA Wt: 143.3>139.1>139.5>141.3 lbs    LVAD interrogation reveals:  Speed: 5600 Flow: 3.6 Power: 4.3 w PI: 5.2  Alarms: none Events: none today Hematocrit: 38  Fixed speed: 5600 Low speed limit: 5300  Drive Line: wound vac at -125. Veraflo on with 6 cc of Vashe every 2 hrs with dwell time of 3 minutes. Anchor secure. ~ 300 cc bloody drainage in wound vac canister.   Labs:  LDH trend: 129>142>136>139>155  INR trend: 2.2>1.4>1.1>1.1  WBC trend: 8.6>7.7>7.3>5.7>6.1  Anticoagulation Plan: -INR Goal: 2-2.5 -ASA Dose: off  Blood Products:  09/30/23: 2 FFP   Infection:  09/29/23>>blood cxs x 2>> no growth x 1 days 09/29/23>>driveline cx>>MODERATE STAPHYLOCOCCUS AUREUS  10/01/23>>driveline cx OR>FEW STAPHYLOCOCCUS AUREUS; final 10/01/23>> AFB cx OR> negative; final 10/01/23> Fungus cx OR> negative; final   Adverse Events on VAD: -  Plan/Recommendations:  1. Please page VAD coordinator for any alarms or VAD equipment issues. 2. Wound VAC set to [facility] therapy, page VAD coordinator for any issues 3. Pt to scheduled to return to OR tomorrow with Dr Donata Clay. VAD coordinator will accompany.   Alyce Pagan RN VAD Coordinator  Office: [phone]  24/7 Pager: [phone]

## 2023-10-06 NOTE — Progress Notes (Signed)
3 Days Post-Op Procedure(s) (LRB): Wound Vac Change (N/A) Wound Irrigation (N/A) Subjective: VAD tunnel wound effectively treated with wound VAC in Veroflow mode using Vashe wound solution. Patient states his surgical pain is slowly improving. He has been afebrile with stable hemoglobin level. Receiving IV Ancef for MSSA VAD tunnel infection. Improving his nutritional intake as appetite slowly improves.  VAD parameters are satisfactory. Objective: Vital signs in last 24 hours: Temp:  [97.6 F (36.4 C)-98.6 F (37 C)] 97.8 F (36.6 C) (10/14 1537) Pulse Rate:  [70-76] 73 (10/14 1537) Cardiac Rhythm: Normal sinus rhythm;Bundle branch block (10/14 0700) Resp:  [11-17] 11 (10/14 1141) BP: (96-119)/(70-87) 96/84 (10/14 1537) SpO2:  [95 %-98 %] 96 % (10/14 1537) Weight:  [64.1 kg] 64.1 kg (10/14 0800)  Hemodynamic parameters for last 24 hours:  Afebrile sinus rhythm  Intake/Output from previous day: 10/13 0701 - 10/14 0700 In: 446.1 [I.V.:124.9; IV Piggyback:321.2] Out: 1900 [Urine:1650; Drains:250] Intake/Output this shift: No intake/output data recorded.  Exam  Alert and appropriate Wound VAC sponge compressed, seal test normal Normal VAD hum Normal sinus rhythm Lungs clear  Lab Results: Recent Labs    10/05/23 0609 10/06/23 0500  WBC 5.7 6.1  HGB 11.9* 11.8*  HCT 36.7* 36.5*  PLT 270 283   BMET:  Recent Labs    10/05/23 0609 10/06/23 0500  NA 138 141  K 4.0 3.7  CL 99 103  CO2 31 28  GLUCOSE 88 117*  BUN 12 14  CREATININE 0.82 1.04  CALCIUM 9.1 8.8*    PT/INR:  Recent Labs    10/06/23 0500  LABPROT 14.8  INR 1.1   ABG    Component Value Date/Time   PHART 7.510 (H) 08/03/2023 0445   HCO3 27.3 08/03/2023 0445   TCO2 28 08/03/2023 0445   ACIDBASEDEF 2.0 07/31/2023 1704   O2SAT 68.1 08/08/2023 0535   CBG (last 3)  No results for input(s): "GLUCAP" in the last 72 hours.  Assessment/Plan: S/P Procedure(s) (LRB): Wound Vac Change  (N/A) Wound Irrigation (N/A)  Continue wound care with wound VAC and IV antibiotics Since further excisional debridement should not be needed we will slowly start the Coumadin and maintain low-dose heparin at 500 units/h.  Discussed with pharmacy.  Plan return to the OR in a.m. for wound washout with Vashe solution and wound VAC change.  If the wound appears to be clean may also apply biologic wound matrix material to enhance healing by secondary intent.  Procedure discussed with patient and he understands.  LOS: 7 days    Lovett Sox 10/06/2023

## 2023-10-06 NOTE — Progress Notes (Signed)
Wound vac site leaking vashe despite multiple attempts at reinforcing dressing.  Small amount of blood noted at driveline site estimated to be 5cc.  VAC irrigation turned off and VAC therapy continued at 125 suction.  Patient tolerating well.  Seal intact.  Jill Side, LVAD coordinator notified.  No new orders at this time.

## 2023-10-06 NOTE — Progress Notes (Addendum)
Advanced Heart Failure VAD Team Note  PCP-Cardiologist: None   Subjective:   09/29/23: admitted with DL infection. CT A/P with fluid collection around driveline suggestive of abscess. Wound Cx + Staph Aureus  09/30/23: developed significant bleeding from DL site>>given FFP 16/1/09: I/D with CTS with wound vac placement. Surgical wound cultures 10/9 with MSSA 10/03/23: VAC change  Remains on cefazolin. Bcx NG x 5 days. AF. WBC nl. Denies subjective f/c. C/w pain along DL site, 6/04.   No dyspnea or CP.   INR 1.1 today. On heparin gtt.   MAPs 80s overnight.   LVAD INTERROGATION:  HeartMate II LVAD:   Flow 3.7 liters/min, speed 5600, power 4.3, PI 5.1. 4 PI events.  VAD interrogated personally. Parameters stable.  Objective:    Vital Signs:   Temp:  [97.6 F (36.4 C)-98.6 F (37 C)] 97.8 F (36.6 C) (10/14 0324) Pulse Rate:  [71-80] 71 (10/14 0324) Resp:  [14-17] 14 (10/14 0500) BP: (81-104)/(56-83) 100/79 (10/14 0324) SpO2:  [95 %-98 %] 95 % (10/14 0324) Last BM Date : 09/30/23 VAD Mean arterial Pressure  80s   Intake/Output:   Intake/Output Summary (Last 24 hours) at 10/06/2023 0739 Last data filed at 10/06/2023 0506 Gross per 24 hour  Intake 446.06 ml  Output 1900 ml  Net -1453.94 ml     Physical Exam   General:  laying in bed. Looks slightly uncomfortable (pt reports to DL pain). No distress  HEENT: normal  Neck: supple. JVP not elevated.  Carotids 2+ bilat; no bruits. No lymphadenopathy or thryomegaly appreciated. Cor: LVAD hum.  Lungs: CTAB. No wheezing  Abdomen:  soft, nontender, non-distended. No hepatosplenomegaly. No bruits or masses. Good bowel sounds. Driveline site with wound vac Anchor in place.  Extremities: no cyanosis, clubbing, rash. Warm no edema  Neuro: alert & oriented x 3. No focal deficits. Moves all 4 without problem    Telemetry   NSR 65 bpm (Personally reviewed)    Labs   Basic Metabolic Panel: Recent Labs  Lab 09/30/23 0221  09/30/23 0905 10/01/23 0545 10/02/23 0237 10/03/23 0232 10/04/23 0424 10/05/23 0609 10/06/23 0500  NA 138 139   < > 138 140 137 138 141  K 3.8 4.5   < > 4.1 3.6 3.9 4.0 3.7  CL 98 98   < > 99 98 100 99 103  CO2 31 27   < > 29 29 27 31 28   GLUCOSE 97 85   < > 99 94 149* 88 117*  BUN 10 9   < > 17 14 14 12 14   CREATININE 0.91 0.93   < > 0.96 0.76 0.72 0.82 1.04  CALCIUM 8.9 9.2   < > 9.4 9.0 9.1 9.1 8.8*  MG 1.8 1.8  --   --   --   --   --   --    < > = values in this interval not displayed.    Liver Function Tests: Recent Labs  Lab 09/30/23 0905  AST 18  ALT 13  ALKPHOS 132*  BILITOT 0.3  PROT 6.8  ALBUMIN 3.3*   No results for input(s): "LIPASE", "AMYLASE" in the last 168 hours. No results for input(s): "AMMONIA" in the last 168 hours.  CBC: Recent Labs  Lab 10/02/23 0237 10/03/23 0232 10/04/23 0424 10/05/23 0609 10/06/23 0500  WBC 7.3 5.7 5.5 5.7 6.1  HGB 12.8* 12.5* 10.7* 11.9* 11.8*  HCT 39.7 37.8* 33.5* 36.7* 36.5*  MCV 87.3 87.5 86.6 88.9 88.8  PLT  265 282 293 270 283    INR: Recent Labs  Lab 10/02/23 0237 10/03/23 0232 10/04/23 0424 10/05/23 0609 10/06/23 0500  INR 1.4* 1.1 1.1 1.1 1.1    Other results:    Imaging   No results found.   Medications:     Scheduled Medications:  digoxin  0.125 mg Oral Daily   feeding supplement  237 mL Oral TID WC   ivabradine  5 mg Oral BID WC   losartan  25 mg Oral BID   mexiletine  200 mg Oral BID   pantoprazole  40 mg Oral Daily   polyethylene glycol  17 g Oral Daily   senna-docusate  1 tablet Oral QHS   sertraline  25 mg Oral Daily   sodium chloride flush  10-40 mL Intracatheter Q12H   traMADol  100 mg Oral TID   Warfarin - Pharmacist Dosing Inpatient   Does not apply q1600   zinc sulfate  220 mg Oral Daily    Infusions:   ceFAZolin (ANCEF) IV 200 mL/hr at 10/06/23 0506   heparin 500 Units/hr (10/06/23 0506)    PRN Medications: acetaminophen, albuterol, HYDROmorphone (DILAUDID)  injection, mouth rinse, oxyCODONE, sodium chloride flush, sorbitol, traZODone  Assessment/Plan:   1. Acute Driveline Infection - Prior clinic visit 9/30 DL site was noted by VAD RN to be not completely incorporated. Long discussion about strategies to protect DL and keep it immobilized. Reported mild itching and redness at the time. - Now admitted with driveline infection.  - CT chest/abd/pelvis showed abscess  - Wound Cx + Staph Aureus - 10/9 debridement + wound vac>>Surgical wound cultures 10/9 with MSSA - BCx NGTD  - ID consulted: Dapto transitioned to Cefazolin IV with MSSA - 10/11 wound vac changed - Return to OR for VAC change 10/15 - c/w DL pain. Continue w/ dilaudid q4hr + tramadol 100 tid for baseline control  - Continue cefazolin. Appreciate ID   2. 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 ? 2/2 PVCs vs genetically mediated +/- hypertension.  - LHC: normal coronaries.  - cMRI:  LV markedly dilated EF 10% RV 17% NICM - Underwent HM-III VAD on 07/28/25. Much improved NYHA I-II  - INR 1.1 today. Goal 2.0-3.0. Continue Heparin gtt. Going back to OR tomorrow  - Volume ok. 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 Tobacco use - reports quit smoking 7/24 - Cotinine level at 30.1 09/22/23  5. Hx drug abuse - Continue suboxone as outpatient   I reviewed the LVAD parameters from today, and compared the results to the patient's prior recorded data.  No programming changes were made.  The LVAD is functioning within specified parameters.  The patient performs LVAD self-test daily.  LVAD interrogation was negative for any significant power changes, alarms or PI events/speed drops.  LVAD equipment check completed and is in good working  order.  Back-up equipment present.   LVAD education done on emergency procedures and precautions and reviewed exit site care.  Length of Stay: [address] [place] 10/06/2023, 7:39 AM  VAD Team --- VAD ISSUES ONLY--- Pager [phone] (7am - 7am)  Advanced Heart Failure Team  Pager [phone] (M-F; 7a - 5p)  Please contact [facility] for  night-coverage after hours (5p -7a ) and weekends on [email or web address]   Patient seen and examined with the above-signed Advanced Practice Provider and/or Housestaff. I personally reviewed laboratory data, imaging studies and relevant notes. I independently examined the patient and formulated the important aspects of the plan. I have edited the note to reflect any of my changes or salient points. I have personally discussed the plan with the patient and/or family.  Remains on IV abx. Afebrile. Wound vac working well. Still with pain at DL site.   On heparin and warfarin   General:  NAD.  HEENT: normal  Neck: supple. JVP not elevated.  Carotids 2+ bilat; no bruits. No lymphadenopathy or thryomegaly appreciated. Cor: LVAD hum.  Lungs: Clear. Abdomen: obese soft, nontender, non-distended. No hepatosplenomegaly. No bruits or masses. Good bowel sounds. Driveline site with wound vac in place Extremities: no cyanosis, clubbing, rash. Warm no edema  Neuro: alert & oriented x 3. No focal deficits. Moves all 4 without problem   Continue IV abx. Back to OR tomorrow  Continue heparin and warfarin. Discussed warfarin dosing with PharmD personally.  Will continue to adjust pain med regimen.   VAD interrogated personally. Parameters stable.  [name], MD  8:35 AM

## 2023-10-06 NOTE — Plan of Care (Signed)
  Problem: Education: Goal: Understanding of CV disease, CV risk reduction, and recovery process will improve Outcome: Progressing Goal: Individualized Educational Video(s) Outcome: Progressing   Problem: Activity: Goal: Ability to return to baseline activity level will improve Outcome: Progressing   Problem: Cardiovascular: Goal: Ability to achieve and maintain adequate cardiovascular perfusion will improve Outcome: Progressing Goal: Vascular access site(s) Level 0-1 will be maintained Outcome: Progressing   Problem: Health Behavior/Discharge Planning: Goal: Ability to safely manage health-related needs after discharge will improve Outcome: Progressing   Problem: Education: Goal: Patient will understand all VAD equipment and how it functions Outcome: Progressing Goal: Patient will be able to verbalize current INR target range and antiplatelet therapy for discharge home Outcome: Progressing   Problem: Cardiac: Goal: LVAD will function as expected and patient will experience no clinical alarms Outcome: Progressing   Problem: Health Behavior/Discharge Planning: Goal: Ability to manage health-related needs will improve Outcome: Progressing   Problem: Clinical Measurements: Goal: Ability to maintain clinical measurements within normal limits will improve Outcome: Progressing Goal: Will remain free from infection Outcome: Progressing Goal: Diagnostic test results will improve Outcome: Progressing Goal: Respiratory complications will improve Outcome: Progressing Goal: Cardiovascular complication will be avoided Outcome: Progressing   Problem: Activity: Goal: Risk for activity intolerance will decrease Outcome: Progressing   Problem: Nutrition: Goal: Adequate nutrition will be maintained Outcome: Progressing   Problem: Elimination: Goal: Will not experience complications related to bowel motility Outcome: Progressing Goal: Will not experience complications related to  urinary retention Outcome: Progressing   Problem: Pain Managment: Goal: General experience of comfort will improve Outcome: Progressing   Problem: Safety: Goal: Ability to remain free from injury will improve Outcome: Progressing   Problem: Skin Integrity: Goal: Risk for impaired skin integrity will decrease Outcome: Progressing

## 2023-10-07 ENCOUNTER — Inpatient Hospital Stay (HOSPITAL_COMMUNITY): Payer: Medicaid Other | Admitting: Certified Registered Nurse Anesthetist

## 2023-10-07 ENCOUNTER — Inpatient Hospital Stay (HOSPITAL_COMMUNITY): Payer: Self-pay | Admitting: Certified Registered Nurse Anesthetist

## 2023-10-07 ENCOUNTER — Encounter (HOSPITAL_COMMUNITY): Payer: Self-pay | Admitting: Cardiology

## 2023-10-07 ENCOUNTER — Encounter (HOSPITAL_COMMUNITY)
Admission: AD | Disposition: A | Discharge status: Home Health | Payer: Self-pay | Source: Ambulatory Visit | Attending: Cardiology

## 2023-10-07 DIAGNOSIS — T827XXA Infection and inflammatory reaction due to other cardiac and vascular devices, implants and grafts, initial encounter: Secondary | ICD-10-CM | POA: Diagnosis not present

## 2023-10-07 DIAGNOSIS — Z95811 Presence of heart assist device: Secondary | ICD-10-CM | POA: Diagnosis not present

## 2023-10-07 DIAGNOSIS — Z4801 Encounter for change or removal of surgical wound dressing: Secondary | ICD-10-CM | POA: Diagnosis not present

## 2023-10-07 DIAGNOSIS — I509 Heart failure, unspecified: Secondary | ICD-10-CM | POA: Diagnosis not present

## 2023-10-07 DIAGNOSIS — A4901 Methicillin susceptible Staphylococcus aureus infection, unspecified site: Secondary | ICD-10-CM | POA: Diagnosis not present

## 2023-10-07 HISTORY — PX: APPLICATION OF WOUND VAC: SHX5189

## 2023-10-07 HISTORY — PX: STERNAL WOUND DEBRIDEMENT: SHX1058

## 2023-10-07 LAB — CBC
HCT: 36.1 % — ABNORMAL LOW (ref 39.0–52.0)
Hemoglobin: 11.7 g/dL — ABNORMAL LOW (ref 13.0–17.0)
MCH: 27.9 pg (ref 26.0–34.0)
MCHC: 32.4 g/dL (ref 30.0–36.0)
MCV: 86.2 fL (ref 80.0–100.0)
Platelets: 296 10*3/uL (ref 150–400)
RBC: 4.19 MIL/uL — ABNORMAL LOW (ref 4.22–5.81)
RDW: 13.8 % (ref 11.5–15.5)
WBC: 8.3 10*3/uL (ref 4.0–10.5)
nRBC: 0 % (ref 0.0–0.2)

## 2023-10-07 LAB — PROTIME-INR
INR: 1.1 (ref 0.8–1.2)
Prothrombin Time: 14.7 s (ref 11.4–15.2)

## 2023-10-07 LAB — BASIC METABOLIC PANEL
Anion gap: 9 (ref 5–15)
BUN: 14 mg/dL (ref 6–20)
CO2: 28 mmol/L (ref 22–32)
Calcium: 9.2 mg/dL (ref 8.9–10.3)
Chloride: 98 mmol/L (ref 98–111)
Creatinine, Ser: 0.83 mg/dL (ref 0.61–1.24)
GFR, Estimated: >60 mL/min (ref >60)
Glucose, Bld: 98 mg/dL (ref 70–99)
Potassium: 3.9 mmol/L (ref 3.5–5.1)
Sodium: 135 mmol/L (ref 135–145)

## 2023-10-07 LAB — HEPARIN LEVEL (UNFRACTIONATED): Heparin Unfractionated: <0.1 [IU]/mL — ABNORMAL LOW (ref 0.30–0.70)

## 2023-10-07 LAB — LACTATE DEHYDROGENASE: LDH: 194 U/L — ABNORMAL HIGH (ref 98–192)

## 2023-10-07 SURGERY — DEBRIDEMENT, WOUND, STERNUM
Anesthesia: General

## 2023-10-07 MED ORDER — SODIUM CHLORIDE 0.9 % IR SOLN
Status: DC | PRN
  Administered 2023-10-07: 1000 mL

## 2023-10-07 MED ORDER — ALBUMIN HUMAN 5 % IV SOLN
INTRAVENOUS | Status: DC | PRN
Start: 2023-10-07 — End: 2023-10-07

## 2023-10-07 MED ORDER — ROCURONIUM BROMIDE 10 MG/ML (PF) SYRINGE
PREFILLED_SYRINGE | INTRAVENOUS | Status: AC
  Filled 2023-10-07: qty 20

## 2023-10-07 MED ORDER — MIDAZOLAM HCL 2 MG/2ML IJ SOLN
INTRAMUSCULAR | Status: DC | PRN
  Administered 2023-10-07: 2 mg via INTRAVENOUS

## 2023-10-07 MED ORDER — VASHE WOUND IRRIGATION OPTIME
TOPICAL | Status: DC | PRN
Start: 2023-10-07 — End: 2023-10-07
  Administered 2023-10-07: 34 [oz_av]

## 2023-10-07 MED ORDER — WARFARIN SODIUM 2 MG PO TABS: 2.0000 mg | ORAL_TABLET | Freq: Once | ORAL | Status: DC

## 2023-10-07 MED ORDER — HEPARIN (PORCINE) 25000 UT/250ML-% IV SOLN
500.0000 [IU]/h | INTRAVENOUS | Status: DC
  Administered 2023-10-07 – 2023-10-13 (×3): 500 [IU]/h via INTRAVENOUS
  Filled 2023-10-07 (×5): qty 250

## 2023-10-07 MED ORDER — FENTANYL CITRATE (PF) 250 MCG/5ML IJ SOLN
INTRAMUSCULAR | Status: DC | PRN
  Administered 2023-10-07: 50 ug via INTRAVENOUS
  Administered 2023-10-07: 75 ug via INTRAVENOUS

## 2023-10-07 MED ORDER — TRAMADOL HCL 50 MG PO TABS
100.0000 mg | ORAL_TABLET | Freq: Four times a day (QID) | ORAL | Status: DC
  Administered 2023-10-07 – 2023-10-13 (×13): 100 mg via ORAL
  Filled 2023-10-07 (×16): qty 2

## 2023-10-07 MED ORDER — PHENYLEPHRINE 80 MCG/ML (10ML) SYRINGE FOR IV PUSH (FOR BLOOD PRESSURE SUPPORT)
PREFILLED_SYRINGE | INTRAVENOUS | Status: DC | PRN
  Administered 2023-10-07: 160 ug via INTRAVENOUS

## 2023-10-07 MED ORDER — PROPOFOL 10 MG/ML IV BOLUS
INTRAVENOUS | Status: DC | PRN
  Administered 2023-10-07: 50 mg via INTRAVENOUS

## 2023-10-07 MED ORDER — PHENYLEPHRINE HCL-NACL 20-0.9 MG/250ML-% IV SOLN
INTRAVENOUS | Status: DC | PRN
  Administered 2023-10-07: 40 ug/min via INTRAVENOUS

## 2023-10-07 MED ORDER — ORAL CARE MOUTH RINSE
15.0000 mL | Freq: Once | OROMUCOSAL | Status: AC
  Administered 2023-10-07: 15 mL via OROMUCOSAL

## 2023-10-07 MED ORDER — SODIUM CHLORIDE 0.9 % IV SOLN
INTRAVENOUS | Status: DC | PRN
Start: 2023-10-07 — End: 2023-10-07

## 2023-10-07 MED ORDER — MIDAZOLAM HCL 2 MG/2ML IJ SOLN
INTRAMUSCULAR | Status: AC
  Filled 2023-10-07: qty 2

## 2023-10-07 MED ORDER — WARFARIN SODIUM 3 MG PO TABS
3.0000 mg | ORAL_TABLET | Freq: Once | ORAL | Status: AC
  Administered 2023-10-07: 3 mg via ORAL
  Filled 2023-10-07 (×3): qty 1

## 2023-10-07 MED ORDER — FENTANYL CITRATE (PF) 100 MCG/2ML IJ SOLN
INTRAMUSCULAR | Status: AC
  Filled 2023-10-07: qty 2

## 2023-10-07 MED ORDER — CHLORHEXIDINE GLUCONATE 0.12 % MT SOLN: 15.0000 mL | Freq: Once | OROMUCOSAL | Status: DC

## 2023-10-07 MED ORDER — SUGAMMADEX SODIUM 200 MG/2ML IV SOLN
INTRAVENOUS | Status: DC | PRN
  Administered 2023-10-07: 127 mg via INTRAVENOUS

## 2023-10-07 MED ORDER — FENTANYL CITRATE (PF) 100 MCG/2ML IJ SOLN
25.0000 ug | INTRAMUSCULAR | Status: DC | PRN
  Administered 2023-10-07 (×2): 50 ug via INTRAVENOUS

## 2023-10-07 MED ORDER — FENTANYL CITRATE (PF) 250 MCG/5ML IJ SOLN
INTRAMUSCULAR | Status: AC
  Filled 2023-10-07: qty 5

## 2023-10-07 MED ORDER — ROCURONIUM BROMIDE 10 MG/ML (PF) SYRINGE
PREFILLED_SYRINGE | INTRAVENOUS | Status: DC | PRN
  Administered 2023-10-07: 50 mg via INTRAVENOUS

## 2023-10-07 MED ORDER — ONDANSETRON HCL 4 MG/2ML IJ SOLN
INTRAMUSCULAR | Status: DC | PRN
  Administered 2023-10-07 (×2): 4 mg via INTRAVENOUS

## 2023-10-07 MED ORDER — PHENYLEPHRINE 80 MCG/ML (10ML) SYRINGE FOR IV PUSH (FOR BLOOD PRESSURE SUPPORT)
PREFILLED_SYRINGE | INTRAVENOUS | Status: AC
  Filled 2023-10-07: qty 20

## 2023-10-07 SURGICAL SUPPLY — 75 items
APL SKNCLS STERI-STRIP NONHPOA (GAUZE/BANDAGES/DRESSINGS) ×4
ATTRACTOMAT 16X20 MAGNETIC DRP (DRAPES) ×1 IMPLANT
BAG DECANTER FOR FLEXI CONT (MISCELLANEOUS) ×1 IMPLANT
BENZOIN TINCTURE PRP APPL 2/3 (GAUZE/BANDAGES/DRESSINGS) IMPLANT
BLADE CLIPPER SURG (BLADE) ×1 IMPLANT
BLADE SURG 10 STRL SS (BLADE) ×1 IMPLANT
BLADE SURG 15 STRL LF DISP TIS (BLADE) IMPLANT
BLADE SURG 15 STRL SS (BLADE)
BNDG GAUZE DERMACEA FLUFF 4 (GAUZE/BANDAGES/DRESSINGS) IMPLANT
BNDG GZE DERMACEA 4 6PLY (GAUZE/BANDAGES/DRESSINGS)
CANISTER SUCT 3000ML PPV (MISCELLANEOUS) ×1 IMPLANT
CANISTER WOUND CARE 500ML ATS (WOUND CARE) ×1 IMPLANT
CANISTER WOUNDNEG PRESSURE 500 (CANNISTER) IMPLANT
CASSETTE VERAFLO VERALINK (MISCELLANEOUS) IMPLANT
CATH FOLEY 2WAY SLVR 5CC 16FR (CATHETERS) IMPLANT
CATH THORACIC 28FR RT ANG (CATHETERS) IMPLANT
CATH THORACIC 36FR (CATHETERS) IMPLANT
CLIP TI WIDE RED SMALL 24 (CLIP) IMPLANT
CNTNR URN SCR LID CUP LEK RST (MISCELLANEOUS) IMPLANT
CONN Y 3/8X3/8X3/8 BEN (MISCELLANEOUS) IMPLANT
CONT SPEC 4OZ STRL OR WHT (MISCELLANEOUS)
CONTAINER PROTECT SURGISLUSH (MISCELLANEOUS) ×2 IMPLANT
COVER SURGICAL LIGHT HANDLE (MISCELLANEOUS) ×2 IMPLANT
DRAPE LAPAROSCOPIC ABDOMINAL (DRAPES) ×1 IMPLANT
DRAPE SLUSH/WARMER DISC (DRAPES) IMPLANT
DRAPE WARM FLUID 44X44 (DRAPES) IMPLANT
DRESSING VERAFLO CLEANS CC MED (GAUZE/BANDAGES/DRESSINGS) IMPLANT
DRSG AQUACEL AG ADV 3.5X14 (GAUZE/BANDAGES/DRESSINGS) ×1 IMPLANT
DRSG CUTIMED SORBACT 7X9 (GAUZE/BANDAGES/DRESSINGS) IMPLANT
DRSG VAC GRANUFOAM LG (GAUZE/BANDAGES/DRESSINGS) ×1 IMPLANT
DRSG VAC GRANUFOAM MED (GAUZE/BANDAGES/DRESSINGS) ×1 IMPLANT
DRSG VAC GRANUFOAM SM (GAUZE/BANDAGES/DRESSINGS) ×1 IMPLANT
DRSG VERAFLO CLEANSE CC MED (GAUZE/BANDAGES/DRESSINGS) ×1
ELECT REM PT RETURN 9FT ADLT (ELECTROSURGICAL) ×1
ELECTRODE REM PT RTRN 9FT ADLT (ELECTROSURGICAL) ×1 IMPLANT
GAUZE 4X4 16PLY ~~LOC~~+RFID DBL (SPONGE) ×1 IMPLANT
GAUZE PAD ABD 8X10 STRL (GAUZE/BANDAGES/DRESSINGS) IMPLANT
GAUZE SPONGE 4X4 12PLY STRL (GAUZE/BANDAGES/DRESSINGS) ×1 IMPLANT
GAUZE XEROFORM 5X9 LF (GAUZE/BANDAGES/DRESSINGS) IMPLANT
GLOVE BIO SURGEON STRL SZ 6.5 (GLOVE) IMPLANT
GLOVE BIO SURGEON STRL SZ7.5 (GLOVE) ×2 IMPLANT
GOWN STRL REUS W/ TWL LRG LVL3 (GOWN DISPOSABLE) ×4 IMPLANT
GOWN STRL REUS W/TWL LRG LVL3 (GOWN DISPOSABLE) ×4
HANDPIECE INTERPULSE COAX TIP (DISPOSABLE) ×1
HEMOSTAT POWDER SURGIFOAM 1G (HEMOSTASIS) IMPLANT
HEMOSTAT SURGICEL 2X14 (HEMOSTASIS) IMPLANT
KIT BASIN OR (CUSTOM PROCEDURE TRAY) ×1 IMPLANT
KIT SUCTION CATH 14FR (SUCTIONS) IMPLANT
KIT TURNOVER KIT B (KITS) ×1 IMPLANT
NS IRRIG 1000ML POUR BTL (IV SOLUTION) ×1 IMPLANT
PACK CHEST (CUSTOM PROCEDURE TRAY) ×1 IMPLANT
PACK GENERAL/GYN (CUSTOM PROCEDURE TRAY) ×1 IMPLANT
PAD ARMBOARD 7.5X6 YLW CONV (MISCELLANEOUS) ×2 IMPLANT
POWDER MYRIAD MORCLLS FINE 500 (Miscellaneous) IMPLANT
PWDR MYRIAD MORCELLS FINE 500 (Miscellaneous) ×1 IMPLANT
SET HNDPC FAN SPRY TIP SCT (DISPOSABLE) ×1 IMPLANT
SOL PREP POV-IOD 4OZ 10% (MISCELLANEOUS) IMPLANT
SPONGE T-LAP 18X18 ~~LOC~~+RFID (SPONGE) ×5 IMPLANT
SPONGE T-LAP 4X18 ~~LOC~~+RFID (SPONGE) ×1 IMPLANT
STAPLER VISISTAT 35W (STAPLE) IMPLANT
SUT ETHILON 3 0 FSL (SUTURE) IMPLANT
SUT STEEL 6MS V (SUTURE) IMPLANT
SUT STEEL STERNAL CCS#1 18IN (SUTURE) IMPLANT
SUT STEEL SZ 6 DBL 3X14 BALL (SUTURE) IMPLANT
SUT VIC AB 1 CTX 36 (SUTURE) ×2
SUT VIC AB 1 CTX36XBRD ANBCTR (SUTURE) ×2 IMPLANT
SUT VIC AB 2-0 CTX 27 (SUTURE) ×2 IMPLANT
SUT VIC AB 3-0 X1 27 (SUTURE) ×2 IMPLANT
SWAB COLLECTION DEVICE MRSA (MISCELLANEOUS) IMPLANT
SWAB CULTURE ESWAB REG 1ML (MISCELLANEOUS) IMPLANT
SYR 5ML LL (SYRINGE) IMPLANT
TOWEL GREEN STERILE (TOWEL DISPOSABLE) ×1 IMPLANT
TOWEL GREEN STERILE FF (TOWEL DISPOSABLE) ×1 IMPLANT
TRAY FOLEY MTR SLVR 16FR STAT (SET/KITS/TRAYS/PACK) IMPLANT
WATER STERILE IRR 1000ML POUR (IV SOLUTION) ×1 IMPLANT

## 2023-10-07 NOTE — Plan of Care (Signed)
  Problem: Education: Goal: Patient will understand all VAD equipment and how it functions Outcome: Progressing   Problem: Education: Goal: Knowledge of General Education information will improve Description: Including pain rating scale, medication(s)/side effects and non-pharmacologic comfort measures Outcome: Progressing   Problem: Health Behavior/Discharge Planning: Goal: Ability to manage health-related needs will improve Outcome: Progressing   Problem: Clinical Measurements: Goal: Ability to maintain clinical measurements within normal limits will improve Outcome: Progressing Goal: Will remain free from infection Outcome: Progressing   Problem: Nutrition: Goal: Adequate nutrition will be maintained Outcome: Progressing   Problem: Skin Integrity: Goal: Risk for impaired skin integrity will decrease Outcome: Progressing

## 2023-10-07 NOTE — Progress Notes (Addendum)
Advanced Heart Failure VAD Team Note  PCP-Cardiologist: None   Subjective:   09/29/23: admitted with DL infection. CT A/P with fluid collection around driveline suggestive of abscess. Wound Cx + Staph Aureus  09/30/23: developed significant bleeding from DL site>>given FFP 16/1/09: I/D with CTS with wound vac placement. Surgical wound cultures 10/9 with MSSA 10/03/23: VAC change  1 low flow alarm 8 pm last night while having bowel movement. Flow dropped to 2.2 L/min.  Reports PI events with bowel movements in the past. Denies straining.  Pain control remains an issue. Requiring dilaudid q 4hrs. Back to OR today for VAC change.  LVAD INTERROGATION:  HeartMate II LVAD:   Flow 4.1 liters/min, speed 5600, power 4, PI 4.7. 11 PI events so far this am. 1 low flow alarm at 8:02 PM 10/06/23.  VAD interrogated personally. Parameters stable.  Objective:    Vital Signs:   Temp:  [97.8 F (36.6 C)-98.3 F (36.8 C)] 98.1 F (36.7 C) (10/15 0636) Pulse Rate:  [70-94] 94 (10/15 0636) Resp:  [11-18] 18 (10/15 0636) BP: (86-119)/(70-92) 86/73 (10/15 0632) SpO2:  [96 %-98 %] 97 % (10/15 0636) Weight:  [64.1 kg] 64.1 kg (10/14 0800) Last BM Date : 10/06/23 VAD Mean arterial Pressure  80s-90s  Intake/Output:   Intake/Output Summary (Last 24 hours) at 10/07/2023 0714 Last data filed at 10/07/2023 6045 Gross per 24 hour  Intake 738.07 ml  Output 400 ml  Net 338.07 ml     Physical Exam   Physical Exam: GENERAL: Resting in bed. No distress. HEENT: normal  NECK: Supple, no JVD.  2+ bilaterally, no bruits.  CARDIAC:  Mechanical heart sounds with LVAD hum present.  LUNGS:  Clear to auscultation bilaterally.  ABDOMEN:  Soft, round, nontender, positive bowel sounds x4.     LVAD exit site:   VAC present. Tenderness around driveline exit site/wound vac EXTREMITIES:  Warm and dry, no cyanosis, clubbing, rash or edema  NEUROLOGIC:  Alert and oriented x 4. Affect pleasant.       Telemetry    NSR 80s-90s  Labs   Basic Metabolic Panel: Recent Labs  Lab 09/30/23 0905 10/01/23 0545 10/03/23 0232 10/04/23 0424 10/05/23 0609 10/06/23 0500 10/07/23 0540  NA 139   < > 140 137 138 141 135  K 4.5   < > 3.6 3.9 4.0 3.7 3.9  CL 98   < > 98 100 99 103 98  CO2 27   < > 29 27 31 28 28   GLUCOSE 85   < > 94 149* 88 117* 98  BUN 9   < > 14 14 12 14 14   CREATININE 0.93   < > 0.76 0.72 0.82 1.04 0.83  CALCIUM 9.2   < > 9.0 9.1 9.1 8.8* 9.2  MG 1.8  --   --   --   --   --   --    < > = values in this interval not displayed.    Liver Function Tests: Recent Labs  Lab 09/30/23 0905  AST 18  ALT 13  ALKPHOS 132*  BILITOT 0.3  PROT 6.8  ALBUMIN 3.3*   No results for input(s): "LIPASE", "AMYLASE" in the last 168 hours. No results for input(s): "AMMONIA" in the last 168 hours.  CBC: Recent Labs  Lab 10/03/23 0232 10/04/23 0424 10/05/23 0609 10/06/23 0500 10/07/23 0540  WBC 5.7 5.5 5.7 6.1 8.3  HGB 12.5* 10.7* 11.9* 11.8* 11.7*  HCT 37.8* 33.5* 36.7* 36.5* 36.1*  MCV  87.5 86.6 88.9 88.8 86.2  PLT 282 293 270 283 296    INR: Recent Labs  Lab 10/03/23 0232 10/04/23 0424 10/05/23 0609 10/06/23 0500 10/07/23 0540  INR 1.1 1.1 1.1 1.1 1.1    Other results:    Imaging   No results found.   Medications:     Scheduled Medications:  digoxin  0.125 mg Oral Daily   feeding supplement  237 mL Oral TID WC   ivabradine  5 mg Oral BID WC   losartan  25 mg Oral BID   mexiletine  200 mg Oral BID   pantoprazole  40 mg Oral Daily   polyethylene glycol  17 g Oral Daily   senna-docusate  1 tablet Oral BID   sertraline  25 mg Oral Daily   sodium chloride flush  10-40 mL Intracatheter Q12H   traMADol  100 mg Oral TID   Warfarin - Pharmacist Dosing Inpatient   Does not apply q1600   zinc sulfate  220 mg Oral Daily    Infusions:   ceFAZolin (ANCEF) IV Stopped (10/07/23 0258)   heparin 500 Units/hr (10/07/23 0507)    PRN Medications: acetaminophen,  albuterol, HYDROmorphone (DILAUDID) injection, mouth rinse, oxyCODONE, sodium chloride flush, sorbitol, traZODone  Assessment/Plan:   1. Acute Driveline Infection - Prior clinic visit 9/30 DL site was noted by VAD RN to be not completely incorporated. Long discussion about strategies to protect DL and keep it immobilized. Reported mild itching and redness at the time. - Now admitted with driveline infection.  - CT chest/abd/pelvis showed abscess  - Wound Cx + Staph Aureus - 10/9 debridement + wound vac>>Surgical wound cultures 10/9 with MSSA - BCx NGTD  - ID consulted: Dapto transitioned to IV cefazolin with MSSA - 10/11 wound vac changed - Return to OR for VAC change today - c/w DL pain. On dilaudid q4hr + tramadol 100 tid. Discussed regimen with PharmD. Tramadol increased to 100 q 6 hrs to hopefully cut back need for dilaudid. - Continue cefazolin. Appreciate ID   2. 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 ? 2/2 PVCs vs genetically mediated +/- hypertension.  - LHC: normal coronaries.  - cMRI:  LV markedly dilated EF 10% RV 17% NICM - Underwent HM-III VAD on 07/28/25. Much improved NYHA I-II  - 1 low flow alarm overnight while having bowel movement. ? Vasovagal episode. Avoid constipation. - INR 1.1 today. Goal 2.0-3.0. Continue Heparin gtt. Going back to OR today. - Volume ok. MAP 80s-90s 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 Tobacco use - reports quit smoking 7/24 - Cotinine level at 30.1 09/22/23  5. Hx drug abuse - Continue suboxone as outpatient. Currently on hold.   I reviewed the LVAD parameters from today, and compared the results to the patient's prior recorded data.  No programming changes were made.  The LVAD is functioning  within specified parameters.  The patient performs LVAD self-test daily.  LVAD interrogation was negative for any significant power changes, alarms or PI events/speed drops.  LVAD equipment check completed and is in good working order.  Back-up equipment present.   LVAD education done on emergency procedures and precautions and reviewed exit site care.  Length of Stay: 8  [name], LINDSAY  N, PA-C 10/07/2023, 7:14 AM  VAD Team --- VAD ISSUES ONLY--- Pager [phone] (7am - 7am)  Advanced Heart Failure Team  Pager [phone] (M-F; 7a - 5p)  Please contact [facility] for night-coverage after hours (5p -7a ) and weekends on [email or web address]  Patient seen and examined with the above-signed Advanced Practice Provider and/or Housestaff. I personally reviewed laboratory data, imaging studies and relevant notes. I independently examined the patient and formulated the important aspects of the plan. I have edited the note to reflect any of my changes or salient points. I have personally discussed the plan with the patient and/or family.  Remains on IV abx. Afebrile. Wound vac reinforced. Still having pain at DL site.   On heparin/ warfarin. INR 1.1  General:  NAD.  HEENT: normal  Neck: supple. JVP not elevated.  Carotids 2+ bilat; no bruits. No lymphadenopathy or thryomegaly appreciated. Cor: LVAD hum.  Lungs: Clear. Abdomen: obese soft, nontender, non-distended. No hepatosplenomegaly. No bruits or masses. Good bowel sounds. Driveline site with wound vac. Anchor in place.  Extremities: no cyanosis, clubbing, rash. Warm no edema  Neuro: alert & oriented x 3. No focal deficits. Moves all 4 without problem   Continue IV abx. For OR today for washout.   Continue IV heparin/warfarin. Discussed warfarin dosing with PharmD personally.  Pain meds adjusted.   VAD interrogated personally. Parameters stable.  [name], MD  1:05 PM

## 2023-10-07 NOTE — Op Note (Unsigned)
NAMEJAQUAY, Joel York MEDICAL RECORD NO: [id number] ACCOUNT NO: [id number] DATE OF BIRTH: 10/05/1986 FACILITY: [facility] LOCATION: [facility] PHYSICIAN: Kerin Perna III, MD  Operative Report   DATE OF PROCEDURE: 10/07/2023  OPERATION:   1.  Wound VAC change of infected VAD tunnel. 2.  Application of Myriad Morcells 500 mg to the wound bed.  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  ANESTHESIA:  General.  DESCRIPTION OF PROCEDURE:  The patient was evaluated in preoperative holding where informed consent was documented and the details of the procedure were reviewed again with the patient including the expected benefits and the associated risks of surgical  pain, recurrent infection, and bleeding.  He demonstrated his understanding and agreed.  The patient was brought from the preoperative holding to the OR by the anesthesia team as well as by the VAD coordinator.  The VAD coordinator remained with the patient's entire procedure to monitor the VAD equipment and to help manage hemodynamics.  The patient was brought in the OR and placed supine on the operating table.  He was connected to the monitoring systems and then underwent induction of general anesthesia and was intubated.  He remained stable.  The previous wound VAC system including the sheets and sponge were removed.  The patient's abdomen was prepped and draped as a sterile field.  A proper timeout was performed.  The wound was inspected.  The main wound was 10 cm long x 3 cm wide.  The small separate wound was 3 cm long and a 1 cm wide.  There was 100% clean granulation tissue.  There is no purulence.  There was minimal bleeding.  A liter of Vashe wound solution  was then gently irrigated through the entire wound with the  Asepto syringe.  After that 500 mg of Myriad Morcells were applied to the wound at the proximal extent and then covered with Sorbact mesh and tacked down with interrupted 3-0 Vicryl sutures.   Next over the Sorbact a blue VeraFlo sponge was placed into the wound bed connecting the 2 openings.  Next, the sheath for the VeraFlo wound system were applied and openings were created for the infusion catheter and the drainage catheter.  The system  was then turned on the suction.  There was good compression of the sponges and the seal test was checked and was satisfactory.  The patient was reversed from anesthesia, extubated and returned to the recovery room, accompanied by the VAD coordinator.   PUS D: 10/07/2023 4:10:44 pm T: 10/07/2023 4:30:00 pm  JOB: [id number]/ [id number]

## 2023-10-07 NOTE — Transfer of Care (Signed)
Immediate Anesthesia Transfer of Care Note  Patient: Nainoa Woldt  Procedure(s) Performed: VAD TUNNEL DEBRIDEMENT USING MYRIAD MORCELLS FINE WOUND VAC CHANGE  Patient Location: PACU  Anesthesia Type:General  Level of Consciousness: awake, alert , and oriented  Airway & Oxygen Therapy: Patient Spontanous Breathing  Post-op Assessment: Report given to RN and Post -op Vital signs reviewed and stable  Post vital signs: Reviewed and stable  Last Vitals:  Vitals Value Taken Time  BP 112/89 10/07/23 1533  Temp 98   Pulse 81 10/07/23 1536  Resp 17 10/07/23 1536  SpO2 89 % 10/07/23 1536  Vitals shown include unfiled device data.  Last Pain:  Vitals:   10/07/23 1339  TempSrc:   PainSc: 3       Patients Stated Pain Goal: 1 (10/07/23 1339)  Complications: No notable events documented.

## 2023-10-07 NOTE — Progress Notes (Signed)
CCC Pre-op Review 1.Surgical orders: Consent orders and signed: order in place, asked RN to complete Is patient alert and oriented to sign consent: asked RN to complete Antibiotic: Ancef q8 due at 1200  2.  Pre-procedure checklist completed: complete  3.  NPO: 0001 10/15  4.  CHG Bath completed: asked RN to complete Belongings removed and placed in clean gown: asked RN to complete  5.  Labs Performed: CBC WNL CMP/BMP WNL PT/INR WNL HA1C NA Type and Screen 10/8 Surgical PCR: NA Pregnancy: NA EKG: 07/31/2023 Chest x-ray: 09/30/2023   6.  Recent H&P or progress note if inpatient: completed  7.  Language Barrier:  none  8.  Vital Signs: WNL, pt has VAD Oxygen: RA Tele: yes Can the patient travel without Tele  9.  Medications: Cardiac Drips: NA Pain Medications:dilaudid LD 8315 10/15 Beta Blocker: yes Anticoagulants: heparin infusion GLP1: NA  Pre-op Medications Ordered:   10. IV access: LFA 20G, Right PICC  11. Diabetic:  NA 12. Additional Information that short stay should be aware of? Will need VAD RN for transport

## 2023-10-07 NOTE — Progress Notes (Signed)
Pre Procedure note for inpatients:   Joel York has been scheduled for Procedure(s): Wound Vac Change (N/A) Wound Irrigation (N/A) today. The various methods of treatment have been discussed with the patient. After consideration of the risks, benefits and treatment options the patient has consented to the planned procedure.   The patient has been seen and labs reviewed. There are no changes in the patient's condition to prevent proceeding with the planned procedure today.  Recent labs:  Lab Results  Component Value Date   WBC 8.3 10/07/2023   HGB 11.7 (L) 10/07/2023   HCT 36.1 (L) 10/07/2023   PLT 296 10/07/2023   GLUCOSE 98 10/07/2023   CHOL 74 07/15/2023   TRIG 140 07/15/2023   HDL 16 (L) 07/15/2023   LDLCALC 30 07/15/2023   ALT 13 09/30/2023   AST 18 09/30/2023   NA 135 10/07/2023   K 3.9 10/07/2023   CL 98 10/07/2023   CREATININE 0.83 10/07/2023   BUN 14 10/07/2023   CO2 28 10/07/2023   TSH 4.982 (H) 07/14/2023   INR 1.1 10/07/2023   HGBA1C 6.0 (H) 07/30/2023   MICROALBUR <3.0 (H) 07/14/2023    Lovett Sox, MD 10/07/2023 8:25 AM

## 2023-10-07 NOTE — Plan of Care (Signed)
  Problem: Education: Goal: Understanding of CV disease, CV risk reduction, and recovery process will improve Outcome: Progressing Goal: Individualized Educational Video(s) Outcome: Progressing   Problem: Activity: Goal: Ability to return to baseline activity level will improve Outcome: Progressing   Problem: Cardiovascular: Goal: Ability to achieve and maintain adequate cardiovascular perfusion will improve Outcome: Progressing Goal: Vascular access site(s) Level 0-1 will be maintained Outcome: Progressing   Problem: Health Behavior/Discharge Planning: Goal: Ability to safely manage health-related needs after discharge will improve Outcome: Progressing   Problem: Education: Goal: Patient will understand all VAD equipment and how it functions Outcome: Progressing Goal: Patient will be able to verbalize current INR target range and antiplatelet therapy for discharge home Outcome: Progressing   Problem: Cardiac: Goal: LVAD will function as expected and patient will experience no clinical alarms Outcome: Progressing   Problem: Health Behavior/Discharge Planning: Goal: Ability to manage health-related needs will improve Outcome: Progressing   Problem: Clinical Measurements: Goal: Ability to maintain clinical measurements within normal limits will improve Outcome: Progressing Goal: Will remain free from infection Outcome: Progressing Goal: Diagnostic test results will improve Outcome: Progressing Goal: Respiratory complications will improve Outcome: Progressing Goal: Cardiovascular complication will be avoided Outcome: Progressing   Problem: Activity: Goal: Risk for activity intolerance will decrease Outcome: Progressing   Problem: Nutrition: Goal: Adequate nutrition will be maintained Outcome: Progressing   Problem: Coping: Goal: Level of anxiety will decrease Outcome: Progressing   Problem: Elimination: Goal: Will not experience complications related to bowel  motility Outcome: Progressing Goal: Will not experience complications related to urinary retention Outcome: Progressing   Problem: Pain Managment: Goal: General experience of comfort will improve Outcome: Progressing   Problem: Safety: Goal: Ability to remain free from injury will improve Outcome: Progressing   Problem: Skin Integrity: Goal: Risk for impaired skin integrity will decrease Outcome: Progressing

## 2023-10-07 NOTE — Progress Notes (Signed)
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  Pt lying in bed on my arrival. Denies complaints. States pain is well controlled. Plan to return to OR tomorrow for washout/wound vac change with Dr Donata Clay.   Received call from 2C last night due to wound vac leak alarm. Transitioned to [facility] therapy only with resolution of alarming. Bedside RN reinforced outer dressing with good seal achieved at -125. No further alarms noted.   1 asymptomatic Low Flow noted overnight while pt was having a bowel movement.   Vital signs: Temp: 97.4 HR: 74 Doppler Pressure: 88 Auto BP: 113/98 (105) O2 Sat: 98% on RA Wt: 143.3>139.1>139.5>141.3>140.4 lbs    LVAD interrogation reveals:  Speed: 5600 Flow: 3.4 Power: 4.2 w PI: 6.1  Alarms: 1 LOW FLOW overnight (see above) Events: 12 PI events so far today Hematocrit: 38  Fixed speed: 5600 Low speed limit: 5300  Drive Line: wound vac at -125.  Anchor secure. ~ 350 cc bloody drainage in wound vac canister.   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 Please page VAD coordinator for any alarms or VAD equipment issues. 2. Wound VAC set to [facility] therapy, page VAD coordinator for any issues 3. Pt to scheduled to return to OR today with Dr Donata Clay. VAD coordinator will accompany.   Alyce Pagan RN VAD Coordinator  Office: [phone]  24/7 Pager:  [phone]

## 2023-10-07 NOTE — Anesthesia Procedure Notes (Signed)
Procedure Name: Intubation Date/Time: 10/07/2023 2:17 PM  Performed by: Darryl Nestle, CRNAPre-anesthesia Checklist: Patient identified, Emergency Drugs available, Suction available and Patient being monitored Patient Re-evaluated:Patient Re-evaluated prior to induction Oxygen Delivery Method: Circle system utilized Preoxygenation: Pre-oxygenation with 100% oxygen Induction Type: IV induction Ventilation: Mask ventilation without difficulty and Oral airway inserted - appropriate to patient size Laryngoscope Size: Mac and 3 Grade View: Grade I Tube type: Oral Tube size: 7.0 mm Number of attempts: 1 Airway Equipment and Method: Stylet and Oral airway Placement Confirmation: ETT inserted through vocal cords under direct vision, positive ETCO2 and breath sounds checked- equal and bilateral Secured at: 23 cm Tube secured with: Tape Dental Injury: Teeth and Oropharynx as per pre-operative assessment

## 2023-10-07 NOTE — Anesthesia Preprocedure Evaluation (Signed)
Anesthesia Evaluation  Patient identified by MRN, date of birth, ID band Patient awake    Reviewed: Allergy & Precautions, NPO status , Patient's Chart, lab work & pertinent test results, reviewed documented beta blocker date and time   History of Anesthesia Complications Negative for: history of anesthetic complications  Airway Mallampati: III  TM Distance: >3 FB Neck ROM: Full    Dental no notable dental hx.    Pulmonary neg shortness of breath, asthma , neg COPD, Current Smoker and Patient abstained from smoking., neg PE   breath sounds clear to auscultation       Cardiovascular Exercise Tolerance: Poor +CHF  (-) Valvular Problems/Murmurs Rhythm:Regular Rate:Normal  S/p LVAD, with moderate RV dysfunction. Stable LVAD settings.    Neuro/Psych Seizures -, Well Controlled,  PSYCHIATRIC DISORDERS Anxiety        GI/Hepatic ,GERD  ,,(+) neg Cirrhosis        Endo/Other  neg diabetes    Renal/GU Renal disease  negative genitourinary   Musculoskeletal negative musculoskeletal ROS (+)    Abdominal Normal abdominal exam  (+)   Peds  Hematology Lab Results      Component                Value               Date                      WBC                      8.3                 10/07/2023                HGB                      11.7 (L)            10/07/2023                HCT                      36.1 (L)            10/07/2023                MCV                      86.2                10/07/2023                PLT                      296                 10/07/2023              Anesthesia Other Findings   Reproductive/Obstetrics                             Anesthesia Physical Anesthesia Plan  ASA: 4  Anesthesia Plan: General   Post-op Pain Management:    Induction: Intravenous  PONV Risk Score and Plan: 1 and Ondansetron, Dexamethasone, Midazolam and Treatment may vary due to age or  medical condition  Airway Management Planned: LMA and Mask  Additional Equipment: None  Intra-op Plan:   Post-operative Plan: Extubation in OR  Informed Consent: I have reviewed the patients History and Physical, chart, labs and discussed the procedure including the risks, benefits and alternatives for the proposed anesthesia with the patient or authorized representative who has indicated his/her understanding and acceptance.     Dental advisory given  Plan Discussed with: CRNA  Anesthesia Plan Comments: (Possible art line)        Anesthesia Quick Evaluation

## 2023-10-07 NOTE — Progress Notes (Signed)
VAD Coordinator Procedure Note:   VAD Coordinator met patient in OR. Pt undergoing wound irrigation and wound vac change per Dr. Donata Clay. Hemodynamics and VAD parameters monitored by myself and anesthesia throughout the procedure. Blood pressures were obtained with automatic cuff on left arm. BP maintained with Neo drip and Albumin x 1.     Time: Doppler Auto  BP Flow PI Power Speed  Pre-procedure:  1410  101/64 (68) 3.7 5.9 4.2 5600                    Sedation Induction: 1415  107/81 (91) 3.7 5.5 4.3 5600   1430  114/70 (81) 2.8 8.4 4.2 5600   1445  115/103 (108) 2.3 10.5 4.2 5600   1500  107/96 (101) 4.0 5.1 4.1 5600   1515  91/74 (80) 3.5 7.8 4.4 5600  Recovery Area: 1535  112/89 (97) 3.8 6.4 4.3 5600   1545  107/90 (98) 3.9 4.6 4.3 5600   1600  107/78 (89) 3.6 5.2 4.2 5600    Patient Disposition: Patient tolerated the procedure well. VAD Coordinator accompanied and remained with patient in recovery area.   1 LOW FLOW noted during case due to elevated BP. Quickly resolved.   Per Dr Zenaida Niece Maudie Flakes may restart Heparin 500 units/hr at 6 pm. May have Coumadin tonight. Will plan to start VASHE instillation via wound vac tomorrow morning.  Lower wound bed measures: 10L x 2W x 0.5D Upper wound bed measures: 3L x 1W x 0.5D  Plan to return to OR next Tuesday for wound vac change and washout per Dr Donata Clay.   Alyce Pagan RN VAD Coordinator  Office: [phone]  24/7 Pager: [phone]

## 2023-10-07 NOTE — Progress Notes (Addendum)
PHARMACY - ANTICOAGULATION CONSULT NOTE  Pharmacy Consult for Heparin infusion + warfarin  Indication:  LVAD  Allergies  Allergen Reactions   Other Anaphylaxis    Tree Nuts   Peanuts [Peanut Oil] Anaphylaxis   Chlorhexidine Rash    Patient Measurements: Height: 5\' 7"  (170.2 cm) Weight:  (refused weight/will weigh later when he gets up/in pain/RN aware) IBW/kg (Calculated) : 66.1 Heparin Dosing Weight: 63.1 kg  Vital Signs: Temp: 97.4 F (36.3 C) (10/15 0734) Temp Source: Oral (10/15 0734) BP: 108/90 (10/15 0734) Pulse Rate: 94 (10/15 0636)  Labs: Recent Labs    10/05/23 0609 10/05/23 0611 10/06/23 0500 10/07/23 0540  HGB 11.9*  --  11.8* 11.7*  HCT 36.7*  --  36.5* 36.1*  PLT 270  --  283 296  LABPROT 14.1  --  14.8 14.7  INR 1.1  --  1.1 1.1  HEPARINUNFRC  --  <0.10* <0.10* <0.10*  CREATININE 0.82  --  1.04 0.83    Estimated Creatinine Clearance: 111.6 mL/min (by C-G formula based on SCr of 0.83 mg/dL).   Medical History: Past Medical History:  Diagnosis Date   Acid reflux    ADHD (attention deficit hyperactivity disorder)    Asthma    Back pain    Seizures (HCC)    Resolved    Medications:  Infusions:    ceFAZolin (ANCEF) IV Stopped (10/07/23 0258)   heparin 500 Units/hr (10/07/23 0507)    Assessment: 37 yo male with LVAD, admitted with driveline infection. On warfarin PTA - PTA regimen is 2mg  daily except 3 mg MF. INR on admit therapeutic at 2.  INR remains low at 1.1, on heparin infusion at 500 units/hr (no titration). Hgb 11.7, plt 296, LDH 194. Aiming to keep INR at 1.5 while on going I&D and VAC changes.   Goal of Therapy: INR 2-2.5  Heparin level 0.2-0.3 units/ml Monitor platelets by anticoagulation protocol: Yes   Plan:  Continue heparin 500 units/hr  Warfarin 3 mg x1 tonight unless significant change in plan with VAC change today Monitor daily CBC, protime and heparin level, and for s/sx of bleeding Will follow up after OR    Thank you for allowing pharmacy to participate in this patient's care,  Sherron Monday, PharmD, BCCCP Clinical Pharmacist  Phone: [phone] 10/07/2023 9:01 AM  Please check AMION for all [facility] Pharmacy phone numbers After 10:00 PM, call Main Pharmacy [phone]  ADDENDUM S/p OR for wound VAC change - okay per Dr Maren Beach to restart heparin infusion at 500 units/hr at 6pm - will give previously listed warfarin 3 mg tonight also. Plan to return to OR on next Tuesday.  Thank you for allowing pharmacy to participate in this patient's care,  Sherron Monday, PharmD, BCCCP Clinical Pharmacist

## 2023-10-07 NOTE — Brief Op Note (Signed)
10/07/2023  4:00 PM  PATIENT:  Joel York  37 y.o. male  PRE-OPERATIVE DIAGNOSIS:  DRIVELINE INFECTION  POST-OPERATIVE DIAGNOSIS:  DRIVELINE INFECTIO  PROCEDURE:  Procedure(s): VAD TUNNEL DEBRIDEMENT USING MYRIAD MORCELLS FINE (N/A) WOUND VAC CHANGE (N/A)  SURGEON:  Surgeons and Role:    Lovett Sox, MD - Primary  PHYSICIAN ASSISTANT:   ASSISTANTS: none   ANESTHESIA:   general  EBL:  none  BLOOD ADMINISTERED:none  DRAINS: none   LOCAL MEDICATIONS USED:  NONE  SPECIMEN:  No Specimen  DISPOSITION OF SPECIMEN:  N/A  COUNTS:  YES  TOURNIQUET:  * No tourniquets in log *  DICTATION: .Dragon Dictation  PLAN OF CARE:  return to 2C  PATIENT DISPOSITION:  PACU - hemodynamically stable.   Delay start of Pharmacological VTE agent (>24hrs) due to surgical blood loss or risk of bleeding: yes Rtesume heparin 500 units/ hr at 1900 hrs

## 2023-10-07 NOTE — Progress Notes (Signed)
Patient refused to get up for weight this am, states he is in pain and will do it later.  Passed on to next shift.

## 2023-10-07 NOTE — Anesthesia Postprocedure Evaluation (Signed)
Anesthesia Post Note  Patient: Joel York  Procedure(s) Performed: VAD TUNNEL DEBRIDEMENT USING MYRIAD MORCELLS FINE WOUND VAC CHANGE     Patient location during evaluation: PACU Anesthesia Type: General Level of consciousness: awake and alert Pain management: pain level controlled Vital Signs Assessment: post-procedure vital signs reviewed and stable Respiratory status: spontaneous breathing, nonlabored ventilation, respiratory function stable and patient connected to nasal cannula oxygen Cardiovascular status: blood pressure returned to baseline and stable Postop Assessment: no apparent nausea or vomiting Anesthetic complications: no   No notable events documented.  Last Vitals:  Vitals:   10/07/23 1600 10/07/23 1615  BP: 107/78 105/72  Pulse: 72 74  Resp: 12 (!) 22  Temp:    SpO2: 96% 99%    Last Pain:  Vitals:   10/07/23 1543  TempSrc:   PainSc: 5                  Jame Morrell P Ichiro Chesnut

## 2023-10-07 NOTE — Progress Notes (Signed)
Subjective: No new complaints   Antibiotics:  Anti-infectives (From admission, onward)    Start     Dose/Rate Route Frequency Ordered Stop   10/02/23 1405  ceFAZolin (ANCEF) IVPB 2g/100 mL premix        2 g 200 mL/hr over 30 Minutes Intravenous 30 min pre-op 10/02/23 1405 10/03/23 1145   10/01/23 1500  ceFAZolin (ANCEF) IVPB 2g/100 mL premix        2 g 200 mL/hr over 30 Minutes Intravenous Every 8 hours 10/01/23 1455     09/30/23 1420  ceFAZolin (ANCEF) IVPB 2g/100 mL premix  Status:  Discontinued        2 g 200 mL/hr over 30 Minutes Intravenous 30 min pre-op 09/30/23 1420 09/30/23 1425   09/30/23 0800  DAPTOmycin (CUBICIN) IVPB 500 mg/62mL premix  Status:  Discontinued        8 mg/kg  68.3 kg 100 mL/hr over 30 Minutes Intravenous Daily 09/29/23 1938 09/29/23 1939   09/29/23 2300  DAPTOmycin (CUBICIN) IVPB 500 mg/57mL premix  Status:  Discontinued        8 mg/kg  68.3 kg 100 mL/hr over 30 Minutes Intravenous Daily 09/29/23 1939 10/01/23 1500   09/29/23 1645  cefTRIAXone (ROCEPHIN) 1 g in sodium chloride 0.9 % 100 mL IVPB  Status:  Discontinued        1 g 200 mL/hr over 30 Minutes Intravenous Every 24 hours 09/29/23 1553 09/29/23 1840       Medications: Scheduled Meds:  digoxin  0.125 mg Oral Daily   feeding supplement  237 mL Oral TID WC   ivabradine  5 mg Oral BID WC   losartan  25 mg Oral BID   mexiletine  200 mg Oral BID   pantoprazole  40 mg Oral Daily   polyethylene glycol  17 g Oral Daily   senna-docusate  1 tablet Oral BID   sertraline  25 mg Oral Daily   sodium chloride flush  10-40 mL Intracatheter Q12H   traMADol  100 mg Oral Q6H   Warfarin - Pharmacist Dosing Inpatient   Does not apply q1600   zinc sulfate  220 mg Oral Daily   Continuous Infusions:   ceFAZolin (ANCEF) IV Stopped (10/07/23 0258)   heparin 500 Units/hr (10/07/23 0507)   PRN Meds:.acetaminophen, albuterol, HYDROmorphone (DILAUDID) injection, mouth rinse, oxyCODONE, sodium  chloride flush, sorbitol, traZODone    Objective: Weight change:   Intake/Output Summary (Last 24 hours) at 10/07/2023 1242 Last data filed at 10/07/2023 1038 Gross per 24 hour  Intake 738.07 ml  Output 700 ml  Net 38.07 ml   Blood pressure 106/82, pulse 74, temperature 98 F (36.7 C), temperature source Oral, resp. rate 12, height 5\' 7"  (1.702 m), weight 63.7 kg, SpO2 95%. Temp:  [97.4 F (36.3 C)-98.3 F (36.8 C)] 98 F (36.7 C) (10/15 1144) Pulse Rate:  [73-94] 74 (10/15 0921) Resp:  [12-18] 12 (10/15 1144) BP: (86-113)/(73-98) 106/82 (10/15 1144) SpO2:  [95 %-98 %] 95 % (10/15 1144) Weight:  [63.7 kg] 63.7 kg (10/15 1039)  Physical Exam: Physical Exam Constitutional:      Appearance: He is well-developed.  HENT:     Head: Normocephalic and atraumatic.  Eyes:     Conjunctiva/sclera: Conjunctivae normal.  Cardiovascular:     Rate and Rhythm: Normal rate and regular rhythm.  Pulmonary:     Effort: Pulmonary effort is normal. No respiratory distress.     Breath sounds: No wheezing.  Abdominal:     General: There is no distension.     Palpations: Abdomen is soft.  Musculoskeletal:        General: Normal range of motion.     Cervical back: Normal range of motion and neck supple.  Skin:    General: Skin is warm and dry.     Findings: No erythema or rash.  Neurological:     General: No focal deficit present.     Mental Status: He is alert and oriented to person, place, and time.  Psychiatric:        Mood and Affect: Mood normal.        Behavior: Behavior normal.        Thought Content: Thought content normal.        Judgment: Judgment normal.     Abdomen with wound vacuum in place   CBC:    BMET Recent Labs    10/06/23 0500 10/07/23 0540  NA 141 135  K 3.7 3.9  CL 103 98  CO2 28 28  GLUCOSE 117* 98  BUN 14 14  CREATININE 1.04 0.83  CALCIUM 8.8* 9.2     Liver Panel  No results for input(s): "PROT", "ALBUMIN", "AST", "ALT", "ALKPHOS",  "BILITOT", "BILIDIR", "IBILI" in the last 72 hours.      Sedimentation Rate No results for input(s): "ESRSEDRATE" in the last 72 hours. C-Reactive Protein No results for input(s): "CRP" in the last 72 hours.  Micro Results: Recent Results (from the past 720 hour(s))  Aerobic Culture w Gram Stain (superficial specimen)     Status: None   Collection Time: 09/29/23  1:42 PM   Specimen: Abdomen  Result Value Ref Range Status   Specimen Description ABDOMEN  Final   Special Requests DRIVELINE  Final   Gram Stain   Final    RARE WBC SEEN FEW GRAM POSITIVE COCCI Performed at [facility], [address], [place], [place] [address]    Culture MODERATE STAPHYLOCOCCUS AUREUS  Final   Report Status 10/02/2023 FINAL  Final   Organism ID, Bacteria STAPHYLOCOCCUS AUREUS  Final      Susceptibility   Staphylococcus aureus - MIC*    CIPROFLOXACIN <=0.5 SENSITIVE Sensitive     ERYTHROMYCIN >=8 RESISTANT Resistant     GENTAMICIN <=0.5 SENSITIVE Sensitive     OXACILLIN 0.5 SENSITIVE Sensitive     TETRACYCLINE <=1 SENSITIVE Sensitive     VANCOMYCIN 1 SENSITIVE Sensitive     TRIMETH/SULFA <=10 SENSITIVE Sensitive     CLINDAMYCIN <=0.25 SENSITIVE Sensitive     RIFAMPIN <=0.5 SENSITIVE Sensitive     Inducible Clindamycin NEGATIVE Sensitive     LINEZOLID 2 SENSITIVE Sensitive     * MODERATE STAPHYLOCOCCUS AUREUS  Blood culture (routine single)     Status: None   Collection Time: 09/29/23  2:00 PM   Specimen: BLOOD RIGHT FOREARM  Result Value Ref Range Status   Specimen Description BLOOD RIGHT FOREARM  Final   Special Requests   Final    BOTTLES DRAWN AEROBIC AND ANAEROBIC Blood Culture adequate volume   Culture   Final    NO GROWTH 5 DAYS Performed at [facility], [address] [place], [place] [address]    Report Status 10/04/2023 FINAL  Final  Culture, blood (Routine X 2) w Reflex to ID Panel     Status: None   Collection Time: 09/29/23  2:01 PM   Specimen: BLOOD   Result Value Ref Range Status  Specimen Description BLOOD LEFT ANTECUBITAL  Final   Special Requests   Final    BOTTLES DRAWN AEROBIC AND ANAEROBIC Blood Culture adequate volume   Culture   Final    NO GROWTH 5 DAYS Performed at [facility], [address] [place], [place] [address]    Report Status 10/04/2023 FINAL  Final  MRSA Next Gen by PCR, Nasal     Status: None   Collection Time: 09/29/23 10:16 PM   Specimen: Nasal Mucosa; Nasal Swab  Result Value Ref Range Status   MRSA by PCR Next Gen NOT DETECTED NOT DETECTED Final    Comment: (NOTE) The GeneXpert MRSA Assay (FDA approved for NASAL specimens only), is one component of a comprehensive MRSA colonization surveillance program. It is not intended to diagnose MRSA infection nor to guide or monitor treatment for MRSA infections. Test performance is not FDA approved in patients less than 72 years old. Performed at [facility], [address] [place], [place] [address]   Fungus Culture With Stain     Status: None (Preliminary result)   Collection Time: 10/01/23  2:50 PM   Specimen: Path Tissue  Result Value Ref Range Status   Fungus Stain Final report  Final    Comment: (NOTE) Performed At: [facility] [address] [place], [place] [id number] Jolene Schimke MD WG:[id number]    Fungus (Mycology) Culture PENDING  Incomplete   Fungal Source WOUND  Final    Comment: ABDOMEN Performed at [facility], [address], [place], [place] [address]   Aerobic/Anaerobic Culture w Gram Stain (surgical/deep wound)     Status: None   Collection Time: 10/01/23  2:50 PM   Specimen: Path Tissue  Result Value Ref Range Status   Specimen Description WOUND ABDOMEN  Final   Special Requests PT ON DAPTOMYCIN  Final   Gram Stain   Final    ABUNDANT WBC PRESENT, PREDOMINANTLY PMN NO ORGANISMS SEEN    Culture   Final    FEW STAPHYLOCOCCUS AUREUS NO ANAEROBES ISOLATED Performed at [facility],  [address], [place], [place] [address]    Report Status 10/06/2023 FINAL  Final   Organism ID, Bacteria STAPHYLOCOCCUS AUREUS  Final      Susceptibility   Staphylococcus aureus - MIC*    CIPROFLOXACIN <=0.5 SENSITIVE Sensitive     ERYTHROMYCIN >=8 RESISTANT Resistant     GENTAMICIN <=0.5 SENSITIVE Sensitive     OXACILLIN 1 SENSITIVE Sensitive     TETRACYCLINE <=1 SENSITIVE Sensitive     VANCOMYCIN 1 SENSITIVE Sensitive     TRIMETH/SULFA <=10 SENSITIVE Sensitive     CLINDAMYCIN <=0.25 SENSITIVE Sensitive     RIFAMPIN <=0.5 SENSITIVE Sensitive     Inducible Clindamycin NEGATIVE Sensitive     LINEZOLID 2 SENSITIVE Sensitive     * FEW STAPHYLOCOCCUS AUREUS  Acid Fast Smear (AFB)     Status: None   Collection Time: 10/01/23  2:50 PM   Specimen: Path Tissue  Result Value Ref Range Status   AFB Specimen Processing Concentration  Final   Acid Fast Smear Negative  Final    Comment: (NOTE) Performed At: [facility] [address] [place], [place] [id number] Jolene Schimke MD UU:[id number]    Source (AFB) WOUND  Final    Comment: ABDOMEN Performed at [facility], [address], [place], [place] [address]   Fungus Culture Result     Status: None   Collection Time:  10/01/23  2:50 PM  Result Value Ref Range Status   Result 1 Comment  Final    Comment: (NOTE) KOH/Calcofluor preparation:  no fungus observed. Performed At: [facility] [address] [place], [place] [id number] Jolene Schimke MD ON:[id number]     Studies/Results: No results found.    Assessment/Plan:  INTERVAL HISTORY: MSSA growing on cultures he is going back to OR again  Principal Problem:   Infection associated with driveline of left ventricular assist device (LVAD) (HCC) Active Problems:   Staphylococcus aureus infection   MSSA (methicillin susceptible Staphylococcus aureus) infection    Joel York is a 37 y.o. male with LVAD and driveline infection due to MSSA sp wound  vacuum dressing change today  Has PICC in place we will plan on 6 weeks of postoperative IV cefazolin with follow-up in our clinic  Diagnosis: LVAD driveline infection  Culture Result: MSSA  Allergies  Allergen Reactions   Other Anaphylaxis    Tree Nuts   Peanuts [Peanut Oil] Anaphylaxis   Chlorhexidine Rash    OPAT Orders Discharge antibiotics to be given via PICC line Discharge antibiotics: Cefazolin 2 grams IV q 8 hours  Duration: 6 weeks End Date: 11/18/2023  [facility] Care Per Protocol:  Home health RN for IV administration and teaching; PICC line care and labs.    Labs weekly while on IV antibiotics: __x CBC with differential _x_ BMP _x__ Please pull PIC at completion of IV antibiotics __ Please leave PIC in place until doctor has seen patient or been notified  Fax weekly labs to [phone]  Clinic Follow Up Appt:    Angelo Prindle has an appointment on 11/12/2023 at 230PM with Rexene Alberts, NP  at  [facility], which  is located in the [facility] at  [address] in [place].  [address], which is located to the left of the elevators.  Phone: [phone]  Fax: [phone]  [email or web address]  The patient should arrive 30 minutes prior to their appoitment.   I have personally spent 36 minutes involved in face-to-face and non-face-to-face activities for this patient on the day of the visit. Professional time spent includes the following activities: Preparing to see the patient (review of tests), Obtaining and/or reviewing separately obtained history (admission/discharge record), Performing a medically appropriate examination and/or evaluation , Ordering medications/tests/procedures, referring and communicating with other health care professionals, Documenting clinical information in the EMR, Independently interpreting results (not separately reported), Communicating results to  the patient/family/caregiver, Counseling and educating the patient/family/caregiver and Care coordination (not separately reported).    I will sign off for now please call with further questions.   LOS: 8 days   Joel York 10/07/2023, 12:42 PM

## 2023-10-08 DIAGNOSIS — Z95811 Presence of heart assist device: Secondary | ICD-10-CM | POA: Diagnosis not present

## 2023-10-08 DIAGNOSIS — T827XXA Infection and inflammatory reaction due to other cardiac and vascular devices, implants and grafts, initial encounter: Secondary | ICD-10-CM | POA: Diagnosis not present

## 2023-10-08 LAB — BASIC METABOLIC PANEL
Anion gap: 9 (ref 5–15)
BUN: 14 mg/dL (ref 6–20)
CO2: 28 mmol/L (ref 22–32)
Calcium: 9.2 mg/dL (ref 8.9–10.3)
Chloride: 100 mmol/L (ref 98–111)
Creatinine, Ser: 0.67 mg/dL (ref 0.61–1.24)
GFR, Estimated: >60 mL/min (ref >60)
Glucose, Bld: 95 mg/dL (ref 70–99)
Potassium: 3.8 mmol/L (ref 3.5–5.1)
Sodium: 137 mmol/L (ref 135–145)

## 2023-10-08 LAB — CBC
HCT: 35.5 % — ABNORMAL LOW (ref 39.0–52.0)
Hemoglobin: 11.4 g/dL — ABNORMAL LOW (ref 13.0–17.0)
MCH: 27.7 pg (ref 26.0–34.0)
MCHC: 32.1 g/dL (ref 30.0–36.0)
MCV: 86.2 fL (ref 80.0–100.0)
Platelets: 269 10*3/uL (ref 150–400)
RBC: 4.12 MIL/uL — ABNORMAL LOW (ref 4.22–5.81)
RDW: 14 % (ref 11.5–15.5)
WBC: 7.9 10*3/uL (ref 4.0–10.5)
nRBC: 0 % (ref 0.0–0.2)

## 2023-10-08 LAB — PROTIME-INR
INR: 1.2 (ref 0.8–1.2)
Prothrombin Time: 15.7 s — ABNORMAL HIGH (ref 11.4–15.2)

## 2023-10-08 LAB — LACTATE DEHYDROGENASE: LDH: 210 U/L — ABNORMAL HIGH (ref 98–192)

## 2023-10-08 LAB — HEPARIN LEVEL (UNFRACTIONATED): Heparin Unfractionated: <0.1 [IU]/mL — ABNORMAL LOW (ref 0.30–0.70)

## 2023-10-08 LAB — CK: Total CK: 64 U/L (ref 49–397)

## 2023-10-08 MED ORDER — HYDROMORPHONE HCL 1 MG/ML IJ SOLN
2.0000 mg | INTRAMUSCULAR | Status: DC | PRN
  Administered 2023-10-08 – 2023-10-09 (×7): 2 mg via INTRAVENOUS
  Filled 2023-10-08 (×7): qty 2

## 2023-10-08 MED ORDER — WARFARIN SODIUM 3 MG PO TABS
3.0000 mg | ORAL_TABLET | Freq: Once | ORAL | Status: AC
  Administered 2023-10-08: 3 mg via ORAL
  Filled 2023-10-08: qty 1

## 2023-10-08 MED ORDER — CEFAZOLIN IV (FOR PTA / DISCHARGE USE ONLY): 2.0000 g | Freq: Three times a day (TID) | INTRAVENOUS | 0 refills | Status: DC

## 2023-10-08 NOTE — Plan of Care (Signed)
  Problem: Activity: Goal: Ability to return to baseline activity level will improve Outcome: Progressing   Problem: Education: Goal: Patient will understand all VAD equipment and how it functions Outcome: Progressing Goal: Patient will be able to verbalize current INR target range and antiplatelet therapy for discharge home Outcome: Progressing   Problem: Education: Goal: Knowledge of General Education information will improve Description: Including pain rating scale, medication(s)/side effects and non-pharmacologic comfort measures Outcome: Progressing   Problem: Health Behavior/Discharge Planning: Goal: Ability to manage health-related needs will improve Outcome: Progressing   Problem: Nutrition: Goal: Adequate nutrition will be maintained Outcome: Progressing   Problem: Elimination: Goal: Will not experience complications related to bowel motility Outcome: Progressing Goal: Will not experience complications related to urinary retention Outcome: Progressing   Problem: Safety: Goal: Ability to remain free from injury will improve Outcome: Progressing   Problem: Skin Integrity: Goal: Risk for impaired skin integrity will decrease Outcome: Progressing

## 2023-10-08 NOTE — Progress Notes (Signed)
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  Pt lying in bed on my arrival. Denies complaints. States pain is controlled.   This morning wound vac therapy transitioned to [facility] therapy with 6cc of Vashe every 2 hrs with dwell time of 3 minutes. Seal maintained. No leak alarms noted.   Plan to return to OR next Tuesday for washout/wound vac change with Dr Donata Clay.   Vital signs: Temp: 98.7 HR: 90 Doppler Pressure: 96 Auto BP: 114/87 (97) O2 Sat: 98% on RA Wt: 143.3>139.1>139.5>141.3>140.4 lbs    LVAD interrogation reveals:  Speed: 5600 Flow: 4.0 Power: 4.2 w PI: 4.2  Alarms: none Events: none Hematocrit: 38  Fixed speed: 5600 Low speed limit: 5300  Drive Line: wound vac at -125.  Anchor secure. ~ 100 cc in canister. No alarms noted.  Labs:  LDH trend: 129>142>136>139>155>194>210  INR trend: 2.2>1.4>1.1>1.1>1.1>1.2  WBC trend: 8.6>7.7>7.3>5.7>6.1>8.3>7.9  Anticoagulation Plan: -INR Goal: 2-2.5 -ASA Dose: off  Blood Products:  09/30/23: 2 FFP   Infection:  09/29/23>>blood cxs x 2>> no growth x 1 days 09/29/23>>driveline cx>>MODERATE STAPHYLOCOCCUS AUREUS  10/01/23>>driveline cx OR>FEW STAPHYLOCOCCUS AUREUS; final 10/01/23>> AFB cx OR> negative; final 10/01/23> Fungus cx OR> negative; final   Adverse Events on VAD: -  Plan/Recommendations:  1. Please page VAD coordinator for any alarms or VAD equipment issues. 2. Wound VAC set to [facility] therapy, page VAD coordinator for any issues 3. Pt to scheduled to return to OR next Tuesday with Dr Donata Clay. VAD coordinator will accompany.   Alyce Pagan RN VAD Coordinator  Office: [phone]  24/7 Pager: [phone]

## 2023-10-08 NOTE — Progress Notes (Signed)
PHARMACY - ANTICOAGULATION CONSULT NOTE  Pharmacy Consult for Heparin infusion + warfarin  Indication:  LVAD  Allergies  Allergen Reactions   Other Anaphylaxis    Tree Nuts   Peanuts [Peanut Oil] Anaphylaxis   Chlorhexidine Rash    Patient Measurements: Height: 5\' 7"  (170.2 cm) Weight: 63.7 kg (140 lb 6.9 oz) IBW/kg (Calculated) : 66.1 Heparin Dosing Weight: 63.1 kg  Vital Signs: Temp: 98.7 F (37.1 C) (10/16 1058) Temp Source: Oral (10/16 1058) BP: 93/76 (10/16 1147) Pulse Rate: 85 (10/16 0846)  Labs: Recent Labs    10/06/23 0500 10/07/23 0540 10/08/23 0415  HGB 11.8* 11.7* 11.4*  HCT 36.5* 36.1* 35.5*  PLT 283 296 269  LABPROT 14.8 14.7 15.7*  INR 1.1 1.1 1.2  HEPARINUNFRC <0.10* <0.10* <0.10*  CREATININE 1.04 0.83 0.67  CKTOTAL  --   --  64    Estimated Creatinine Clearance: 115 mL/min (by C-G formula based on SCr of 0.67 mg/dL).   Medical History: Past Medical History:  Diagnosis Date   Acid reflux    ADHD (attention deficit hyperactivity disorder)    Asthma    Back pain    Seizures (HCC)    Resolved    Medications:  Infusions:    ceFAZolin (ANCEF) IV 2 g (10/08/23 1150)   heparin 500 Units/hr (10/08/23 0527)    Assessment: 37 yo male with LVAD, admitted with driveline infection. On warfarin PTA - PTA regimen is 2mg  daily except 3 mg MF. INR on admit therapeutic at 2.  INR remains low at 1.2 on low dose warfarin  - with goal about 1.5 while in/out OR for I&D/washout/VAC changes.   On heparin infusion at 500 units/hr (no titration) while INR < 1,8. Hgb 11 stable, plt 200s stable LDH 100s stable.  Goal of Therapy: INR 2-2.5  Heparin level 0.2-0.3 units/ml Monitor platelets by anticoagulation protocol: Yes   Plan:  Continue heparin 500 units/hr  Warfarin 3 mg x1 tonight  Monitor daily CBC, protime and heparin level, and for s/sx of bleeding  Leota Sauers Pharm.D. CPP, BCPS Clinical Pharmacist [phone] 10/08/2023 1:38 PM    Please  check AMION for all [facility] Pharmacy phone numbers After 10:00 PM, call Main Pharmacy [phone]

## 2023-10-08 NOTE — Progress Notes (Addendum)
Advanced Heart Failure VAD Team Note  PCP-Cardiologist: None   Subjective:   09/29/23: admitted with DL infection. CT A/P with fluid collection around driveline suggestive of abscess. Wound Cx + Staph Aureus  09/30/23: developed significant bleeding from DL site>>given FFP 82/9/56: I/D with CTS with wound vac placement. Surgical wound cultures 10/9 with MSSA 10/03/23: VAC change 10/15: I/D with CTS + VAC change  Remains on IV cefazolin. ID recommending 6 weeks IV antibiotics.   Pain control a little better today. Still having quite a bit of pain with activity. He is afebrile, no leukocytosis.  LVAD INTERROGATION:  HeartMate II LVAD:   Flow 4.1 liters/min, speed 5600, power 4, PI 3.2. 6 PI events.  VAD interrogated personally. Parameters stable.  Objective:    Vital Signs:   Temp:  [97.3 F (36.3 C)-98.7 F (37.1 C)] 98.7 F (37.1 C) (10/16 0720) Pulse Rate:  [72-90] 85 (10/16 0448) Resp:  [6-22] 14 (10/16 0720) BP: (97-113)/(72-98) 107/86 (10/16 0720) SpO2:  [93 %-100 %] 98 % (10/16 0720) Weight:  [63.5 kg-63.7 kg] 63.7 kg (10/16 0448) Last BM Date : 10/07/23 VAD Mean arterial Pressure  80s-90s  Intake/Output:   Intake/Output Summary (Last 24 hours) at 10/08/2023 0758 Last data filed at 10/08/2023 0527 Gross per 24 hour  Intake 1070.18 ml  Output 1675 ml  Net -604.82 ml     Physical Exam   Physical Exam: GENERAL: Lying comfortably in bed.  HEENT: normal  NECK: Supple, JVP not elevated CARDIAC:  Mechanical heart sounds with LVAD hum present.  LUNGS:  Clear to auscultation bilaterally.  ABDOMEN:  Soft, round, positive bowel sounds x4.     LVAD exit site:  + wound vac with good seal.   EXTREMITIES:  Warm and dry, no cyanosis, clubbing, rash or edema  NEUROLOGIC:  Alert and oriented x 4.  Affect pleasant.        Telemetry   NSR 80s  Labs   Basic Metabolic Panel: Recent Labs  Lab 10/04/23 0424 10/05/23 0609 10/06/23 0500 10/07/23 0540 10/08/23 0415   NA 137 138 141 135 137  K 3.9 4.0 3.7 3.9 3.8  CL 100 99 103 98 100  CO2 27 31 28 28 28   GLUCOSE 149* 88 117* 98 95  BUN 14 12 14 14 14   CREATININE 0.72 0.82 1.04 0.83 0.67  CALCIUM 9.1 9.1 8.8* 9.2 9.2    Liver Function Tests: No results for input(s): "AST", "ALT", "ALKPHOS", "BILITOT", "PROT", "ALBUMIN" in the last 168 hours.  No results for input(s): "LIPASE", "AMYLASE" in the last 168 hours. No results for input(s): "AMMONIA" in the last 168 hours.  CBC: Recent Labs  Lab 10/04/23 0424 10/05/23 0609 10/06/23 0500 10/07/23 0540 10/08/23 0415  WBC 5.5 5.7 6.1 8.3 7.9  HGB 10.7* 11.9* 11.8* 11.7* 11.4*  HCT 33.5* 36.7* 36.5* 36.1* 35.5*  MCV 86.6 88.9 88.8 86.2 86.2  PLT 293 270 283 296 269    INR: Recent Labs  Lab 10/04/23 0424 10/05/23 0609 10/06/23 0500 10/07/23 0540 10/08/23 0415  INR 1.1 1.1 1.1 1.1 1.2    Other results:    Imaging   No results found.   Medications:     Scheduled Medications:  digoxin  0.125 mg Oral Daily   feeding supplement  237 mL Oral TID WC   ivabradine  5 mg Oral BID WC   losartan  25 mg Oral BID   mexiletine  200 mg Oral BID   pantoprazole  40 mg Oral  Daily   polyethylene glycol  17 g Oral Daily   senna-docusate  1 tablet Oral BID   sertraline  25 mg Oral Daily   sodium chloride flush  10-40 mL Intracatheter Q12H   traMADol  100 mg Oral Q6H   Warfarin - Pharmacist Dosing Inpatient   Does not apply q1600   zinc sulfate  220 mg Oral Daily    Infusions:   ceFAZolin (ANCEF) IV Stopped (10/08/23 0443)   heparin 500 Units/hr (10/08/23 0527)    PRN Medications: acetaminophen, albuterol, HYDROmorphone (DILAUDID) injection, mouth rinse, oxyCODONE, sodium chloride flush, sorbitol, traZODone  Assessment/Plan:   1. Acute Driveline Infection - Prior clinic visit 9/30 DL site was noted by VAD RN to be not completely incorporated. Long discussion about strategies to protect DL and keep it immobilized. Reported mild  itching and redness at the time. - Now admitted with driveline infection.  - CT chest/abd/pelvis showed abscess  - Wound Cx + Staph Aureus - 10/9 debridement + wound vac>>Surgical wound cultures 10/9 with MSSA - BCx NGTD  - ID consulted: Dapto transitioned to IV cefazolin with MSSA - 10/11 wound vac changed - 10/15 I&D + VAC change - Back to OR on 10/22 - Appreciate ID. Continue cefazolin X 6 weeks (end date 11/18/23). Home infusion rep, Jeri Modena, contacted. Will need to exchange PICC for single lumen prior to discharge. - Discussed pain regimen with RN. Try to stagger tramadol and dilaudid in effort to decrease opioid requirement. Also getting 5-10 mg oxycodone every 4 hrs PRN.   2. 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 ? 2/2 PVCs vs genetically mediated +/- hypertension.  - LHC: normal coronaries.  - cMRI:  LV markedly dilated EF 10% RV 17% NICM - Underwent HM-III VAD on 07/28/25. Much improved NYHA I-II  - INR 1.2. today. Goal 2.0-3.0. Continue Warfarin + Heparin gtt.  - Volume ok. MAP 80s-90s. May need to add amlodipine if MAP continues to trend up. 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 Tobacco use - reports quit smoking 7/24 - Cotinine level at 30.1 09/22/23  5. Hx drug abuse - Continue suboxone as outpatient. Currently on hold.     I reviewed the LVAD parameters from today, and compared the results to the patient's prior recorded data.  No programming changes were made.  The LVAD is functioning within specified parameters.  The patient performs LVAD self-test daily.  LVAD interrogation was negative for any significant power changes, alarms or PI events/speed drops.  LVAD equipment check completed and is in good working order.   Back-up equipment present.   LVAD education done on emergency procedures and precautions and reviewed exit site care.  Length of Stay: [address], [name], PA-C 10/08/2023, 7:58 AM  VAD Team --- VAD ISSUES ONLY--- Pager [phone] (7am - 7am)  Advanced Heart Failure Team  Pager [phone] (M-F; 7a - 5p)  Please contact [facility] for night-coverage after hours (5p -7a ) and weekends on [email or web address]  Patient seen and examined with the above-signed Advanced Practice Provider and/or Housestaff. I personally reviewed laboratory data, imaging studies and relevant notes. I independently examined the patient and formulated the important aspects of the plan. I have edited the note to reflect  any of my changes or salient points. I have personally discussed the plan with the patient and/or family.  Went to OR yesterday for wound clean out and vac replacement.  Remains on IV abx. Now AF. Still having pain at site.   On heparin/warfarin. INR 1.2  General:  NAD.  HEENT: normal  Neck: supple. JVP not elevated.  Carotids 2+ bilat; no bruits. No lymphadenopathy or thryomegaly appreciated. Cor: LVAD hum.  Lungs: Clear. Abdomen: obese soft, nontender, non-distended. No hepatosplenomegaly. No bruits or masses. Good bowel sounds. Driveline site with wound vac. Clean Anchor in place.  Extremities: no cyanosis, clubbing, rash. Warm no edema  Neuro: alert & oriented x 3. No focal deficits. Moves all 4 without problem   Continue IV abx and wound vac.   Continue to adjust pain meds.   Continue heparin/warfarin. Discussed warfarin dosing with PharmD personally.  D/w Dr. Donata Clay  VAD interrogated personally. Parameters stable.  [name], MD  9:52 AM

## 2023-10-08 NOTE — Plan of Care (Signed)
Problem: Education: Goal: Understanding of CV disease, CV risk reduction, and recovery process will improve Outcome: Progressing Goal: Individualized Educational Video(s) Outcome: Progressing   Problem: Activity: Goal: Ability to return to baseline activity level will improve Outcome: Progressing   Problem: Cardiovascular: Goal: Ability to achieve and maintain adequate cardiovascular perfusion will improve Outcome: Progressing Goal: Vascular access site(s) Level 0-1 will be maintained Outcome: Progressing   Problem: Health Behavior/Discharge Planning: Goal: Ability to safely manage health-related needs after discharge will improve Outcome: Progressing   Problem: Education: Goal: Patient will understand all VAD equipment and how it functions Outcome: Progressing Goal: Patient will be able to verbalize current INR target range and antiplatelet therapy for discharge home Outcome: Progressing   Problem: Cardiac: Goal: LVAD will function as expected and patient will experience no clinical alarms Outcome: Progressing   Problem: Education: Goal: Knowledge of General Education information will improve Description: Including pain rating scale, medication(s)/side effects and non-pharmacologic comfort measures Outcome: Progressing   Problem: Health Behavior/Discharge Planning: Goal: Ability to manage health-related needs will improve Outcome: Progressing   Problem: Clinical Measurements: Goal: Ability to maintain clinical measurements within normal limits will improve Outcome: Progressing Goal: Will remain free from infection Outcome: Progressing Goal: Diagnostic test results will improve Outcome: Progressing Goal: Respiratory complications will improve Outcome: Progressing Goal: Cardiovascular complication will be avoided Outcome: Progressing   Problem: Activity: Goal: Risk for activity intolerance will decrease Outcome: Progressing   Problem: Nutrition: Goal: Adequate  nutrition will be maintained Outcome: Progressing   Problem: Coping: Goal: Level of anxiety will decrease Outcome: Progressing   Problem: Elimination: Goal: Will not experience complications related to bowel motility Outcome: Progressing Goal: Will not experience complications related to urinary retention Outcome: Progressing   Problem: Pain Managment: Goal: General experience of comfort will improve Outcome: Progressing   Problem: Safety: Goal: Ability to remain free from injury will improve Outcome: Progressing   Problem: Skin Integrity: Goal: Risk for impaired skin integrity will decrease Outcome: Progressing

## 2023-10-08 NOTE — Progress Notes (Signed)
1 Day Post-Op Procedure(s) (LRB): VAD TUNNEL DEBRIDEMENT USING MYRIAD MORCELLS FINE (N/A) WOUND VAC CHANGE (N/A) Subjective: Patient stable after VAD tunnel washout and wound VAC change yesterday.  The wound is clean with 100% granulation tissue and the power cord is incorporated into the abdominal wall at the proximal extent of the wound.  Today infiltration of Vashe wound solution is started through the wound VAC system.  Plan return to the OR on Tuesday October 22 for wound care and possible transition from wound VAC to daily dressing changes.  Coumadin is being slowly reloaded while maintaining low-dose heparin 500 units/h.  Patient has increased surgical pain following yesterday's procedure and IV Dilaudid dosing will be adjusted and then weaned off.  Importance of good oral intake and nutrition reviewed with patient.  Objective: Vital signs in last 24 hours: Temp:  [97.3 F (36.3 C)-98.7 F (37.1 C)] 98.7 F (37.1 C) (10/16 0720) Pulse Rate:  [72-90] 85 (10/16 0846) Cardiac Rhythm: Normal sinus rhythm (10/16 0849) Resp:  [6-22] 14 (10/16 0720) BP: (97-112)/(72-95) 107/86 (10/16 0720) SpO2:  [93 %-100 %] 98 % (10/16 0720) Weight:  [63.5 kg-63.7 kg] 63.7 kg (10/16 0448)  Hemodynamic parameters for last 24 hours:    Intake/Output from previous day: 10/15 0701 - 10/16 0700 In: 1070.2 [P.O.:240; I.V.:305.3; IV Piggyback:524.9] Out: 1675 [Urine:1650; Drains:25] Intake/Output this shift: Total I/O In: 240 [P.O.:240] Out: 525 [Urine:525]       Exam    General- alert and comfortable.  Wound VAC compressed and functioning with normal seal test.    Neck- no JVD, no cervical adenopathy palpable, no carotid bruit   Lungs- clear without rales, wheezes   Cor- regular rate and rhythm, normal VAD hum     Abdomen- soft, non-tender   Extremities - warm, non-tender, minimal edema   Neuro- oriented, appropriate, no focal weakness   Lab Results: Recent Labs    10/07/23 0540  10/08/23 0415  WBC 8.3 7.9  HGB 11.7* 11.4*  HCT 36.1* 35.5*  PLT 296 269   BMET:  Recent Labs    10/07/23 0540 10/08/23 0415  NA 135 137  K 3.9 3.8  CL 98 100  CO2 28 28  GLUCOSE 98 95  BUN 14 14  CREATININE 0.83 0.67  CALCIUM 9.2 9.2    PT/INR:  Recent Labs    10/08/23 0415  LABPROT 15.7*  INR 1.2   ABG    Component Value Date/Time   PHART 7.510 (H) 08/03/2023 0445   HCO3 27.3 08/03/2023 0445   TCO2 28 08/03/2023 0445   ACIDBASEDEF 2.0 07/31/2023 1704   O2SAT 68.1 08/08/2023 0535   CBG (last 3)  No results for input(s): "GLUCAP" in the last 72 hours.  Assessment/Plan: S/P Procedure(s) (LRB): VAD TUNNEL DEBRIDEMENT USING MYRIAD MORCELLS FINE (N/A) WOUND VAC CHANGE (N/A) Continue wound VAC / Veroflow with Vashe instillation Continue IV Ancef Adjust pain medications and transition to oral medication Continue with Coumadin reload Return to the OR next week Tuesday, October 22.  LOS: 9 days    Lovett Sox 10/08/2023

## 2023-10-09 ENCOUNTER — Encounter (HOSPITAL_COMMUNITY): Payer: Self-pay | Admitting: Cardiothoracic Surgery

## 2023-10-09 DIAGNOSIS — Z95811 Presence of heart assist device: Secondary | ICD-10-CM | POA: Diagnosis not present

## 2023-10-09 DIAGNOSIS — T827XXA Infection and inflammatory reaction due to other cardiac and vascular devices, implants and grafts, initial encounter: Secondary | ICD-10-CM | POA: Diagnosis not present

## 2023-10-09 LAB — CBC
HCT: 37.5 % — ABNORMAL LOW (ref 39.0–52.0)
Hemoglobin: 12.2 g/dL — ABNORMAL LOW (ref 13.0–17.0)
MCH: 28.2 pg (ref 26.0–34.0)
MCHC: 32.5 g/dL (ref 30.0–36.0)
MCV: 86.6 fL (ref 80.0–100.0)
Platelets: 282 10*3/uL (ref 150–400)
RBC: 4.33 MIL/uL (ref 4.22–5.81)
RDW: 14.3 % (ref 11.5–15.5)
WBC: 7.8 10*3/uL (ref 4.0–10.5)
nRBC: 0 % (ref 0.0–0.2)

## 2023-10-09 LAB — BASIC METABOLIC PANEL
Anion gap: 12 (ref 5–15)
BUN: 10 mg/dL (ref 6–20)
CO2: 28 mmol/L (ref 22–32)
Calcium: 9.3 mg/dL (ref 8.9–10.3)
Chloride: 96 mmol/L — ABNORMAL LOW (ref 98–111)
Creatinine, Ser: 0.72 mg/dL (ref 0.61–1.24)
GFR, Estimated: >60 mL/min (ref >60)
Glucose, Bld: 92 mg/dL (ref 70–99)
Potassium: 3.7 mmol/L (ref 3.5–5.1)
Sodium: 136 mmol/L (ref 135–145)

## 2023-10-09 LAB — PROTIME-INR
INR: 1.4 — ABNORMAL HIGH (ref 0.8–1.2)
Prothrombin Time: 17.7 s — ABNORMAL HIGH (ref 11.4–15.2)

## 2023-10-09 LAB — HEPARIN LEVEL (UNFRACTIONATED): Heparin Unfractionated: <0.1 [IU]/mL — ABNORMAL LOW (ref 0.30–0.70)

## 2023-10-09 LAB — LACTATE DEHYDROGENASE: LDH: 181 U/L (ref 98–192)

## 2023-10-09 MED ORDER — HYDROMORPHONE HCL 1 MG/ML IJ SOLN: 2.0000 mg | INTRAMUSCULAR | Status: DC | PRN

## 2023-10-09 MED ORDER — HYDROMORPHONE HCL 1 MG/ML IJ SOLN
2.0000 mg | INTRAMUSCULAR | Status: DC | PRN
  Administered 2023-10-09 – 2023-10-10 (×6): 2 mg via INTRAVENOUS
  Filled 2023-10-09 (×6): qty 2

## 2023-10-09 MED ORDER — POLYETHYLENE GLYCOL 3350 17 G PO PACK
17.0000 g | PACK | Freq: Two times a day (BID) | ORAL | Status: DC
  Administered 2023-10-14: 17 g via ORAL
  Filled 2023-10-09 (×6): qty 1

## 2023-10-09 MED ORDER — WARFARIN SODIUM 2 MG PO TABS
2.0000 mg | ORAL_TABLET | Freq: Once | ORAL | Status: AC
  Administered 2023-10-09: 2 mg via ORAL
  Filled 2023-10-09: qty 1

## 2023-10-09 MED ORDER — HYDROMORPHONE HCL 1 MG/ML IJ SOLN
2.0000 mg | INTRAMUSCULAR | Status: DC | PRN
  Administered 2023-10-09: 2 mg via INTRAVENOUS
  Filled 2023-10-09: qty 2

## 2023-10-09 MED ORDER — SENNOSIDES-DOCUSATE SODIUM 8.6-50 MG PO TABS
2.0000 | ORAL_TABLET | Freq: Two times a day (BID) | ORAL | Status: DC
  Administered 2023-10-09 – 2023-10-16 (×6): 2 via ORAL
  Filled 2023-10-09 (×9): qty 2

## 2023-10-09 NOTE — Progress Notes (Signed)
2 Days Post-Op Procedure(s) (LRB): VAD TUNNEL DEBRIDEMENT USING MYRIAD MORCELLS FINE (N/A) WOUND VAC CHANGE (N/A) Subjective: Patient complains of surgical pain following wound VAC change of his VAD tunnel wound infection 48 hours ago. Wound VAC now in the installation phase with Vashe wound solution (6 mL/h) Seal check is satisfactory, no alarms in wound VAC system Patient on low-dose heparin while Coumadin is being reloaded Objective: Vital signs in last 24 hours: Temp:  [98.3 F (36.8 C)-98.7 F (37.1 C)] 98.6 F (37 C) (10/17 1108) Pulse Rate:  [81-98] 84 (10/17 1149) Cardiac Rhythm: Normal sinus rhythm (10/17 0725) Resp:  [12-40] 12 (10/17 1149) BP: (82-112)/(39-88) 100/82 (10/17 1149) SpO2:  [94 %-100 %] 100 % (10/17 1149) Weight:  [63.4 kg] 63.4 kg (10/17 1131)  Hemodynamic parameters for last 24 hours:  Afebrile  Intake/Output from previous day: 10/16 0701 - 10/17 0700 In: 675.4 [P.O.:240; I.V.:123.4; IV Piggyback:300] Out: 2025 [Urine:2000; Drains:25] Intake/Output this shift: Total I/O In: 28.4 [I.V.:28.4] Out: -   Alert and appropriate Normal VAD hum Wound VAC sponge compressed Lungs clear No epigastric tenderness over proximal VAD tunnel  Lab Results: Recent Labs    10/08/23 0415 10/09/23 0445  WBC 7.9 7.8  HGB 11.4* 12.2*  HCT 35.5* 37.5*  PLT 269 282   BMET:  Recent Labs    10/08/23 0415 10/09/23 0445  NA 137 136  K 3.8 3.7  CL 100 96*  CO2 28 28  GLUCOSE 95 92  BUN 14 10  CREATININE 0.67 0.72  CALCIUM 9.2 9.3    PT/INR:  Recent Labs    10/09/23 0445  LABPROT 17.7*  INR 1.4*   ABG    Component Value Date/Time   PHART 7.510 (H) 08/03/2023 0445   HCO3 27.3 08/03/2023 0445   TCO2 28 08/03/2023 0445   ACIDBASEDEF 2.0 07/31/2023 1704   O2SAT 68.1 08/08/2023 0535   CBG (last 3)  No results for input(s): "GLUCAP" in the last 72 hours.  Assessment/Plan: S/P Procedure(s) (LRB): VAD TUNNEL DEBRIDEMENT USING MYRIAD MORCELLS FINE  (N/A) WOUND VAC CHANGE (N/A) Continue IV Ancef for MSSA wound VAC tunnel infection Continue wound VAC/Veroflow instillation of Vashe Wound VAC change out planned for Tuesday, October 22 Plan to wean IV Dilaudid off over the next 48 hours-reduce to 2 mg IV every 4 hours tomorrow a.m.   LOS: 10 days    Lovett Sox 10/09/2023

## 2023-10-09 NOTE — Progress Notes (Addendum)
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  Pt lying in bed on my arrival. Denies complaints. Continues to have pain at wound site. Discussed with Dr. Maren Beach. Plan to wean Dilaudid to q3h today and q4h tomorrow. Discussed with Anna Genre PA and pt at bedside who verbalized understanding of plan.  This morning wound vac therapy transitioned to [facility] therapy with 6cc of Vashe every 2 hrs with dwell time of 3 minutes. Seal maintained. No leak alarms noted.   Plan to return to OR next Tuesday for washout/wound vac change with Dr Donata Clay.   Vital signs: Temp: 98.3 HR: 91 Doppler Pressure: 96 Auto BP: 82/58 (65) O2 Sat: 100% on RA Wt: 143.3>139.1>139.5>141.3>140.4>139.7 lbs    LVAD interrogation reveals:  Speed: 5600 Flow: 4.4 Power: 4.3 w PI: 4.4  Alarms: none Events: none Hematocrit: 38  Fixed speed: 5600 Low speed limit: 5300  Drive Line: wound vac at -125.  Anchor secure. ~ 100 cc in canister. No alarms noted.  Labs:  LDH trend: 129>142>136>139>155>194>210>181  INR trend: 2.2>1.4>1.1>1.1>1.1>1.2>1.4  WBC trend: 8.6>7.7>7.3>5.7>6.1>8.3>7.9>7.8  Anticoagulation Plan: -INR Goal: 2-2.5 -ASA Dose: off  Blood Products:  09/30/23: 2 FFP  Infection:  09/29/23>>blood cxs x 2>> no growth x 1 days 09/29/23>>driveline cx>>MODERATE STAPHYLOCOCCUS AUREUS  10/01/23>>driveline cx OR>FEW STAPHYLOCOCCUS AUREUS; final 10/01/23>> AFB cx OR> negative; final 10/01/23> Fungus cx OR> negative; final   Adverse Events on VAD: -  Plan/Recommendations:  1. Please page VAD coordinator for any alarms or VAD equipment issues. 2. Wound VAC set to [facility] therapy, page VAD coordinator for any issues 3. Pt to scheduled to return to OR next Tuesday with Dr Donata Clay. VAD coordinator will accompany.   Simmie Davies RN,BSN VAD Coordinator  Office:  [phone]  24/7 Pager: [phone]

## 2023-10-09 NOTE — Progress Notes (Signed)
PHARMACY - ANTICOAGULATION CONSULT NOTE  Pharmacy Consult for Heparin infusion + warfarin  Indication:  LVAD  Allergies  Allergen Reactions   Other Anaphylaxis    Tree Nuts   Peanuts [Peanut Oil] Anaphylaxis   Chlorhexidine Rash    Patient Measurements: Height: 5\' 7"  (170.2 cm) Weight:  (does not want to stand on scale right now/will call when he gets up next) IBW/kg (Calculated) : 66.1 Heparin Dosing Weight: 63.1 kg  Vital Signs: Temp: 98.3 F (36.8 C) (10/17 0723) Temp Source: Oral (10/17 0723) BP: 112/88 (10/17 0844) Pulse Rate: 86 (10/17 0844)  Labs: Recent Labs    10/07/23 0540 10/08/23 0415 10/09/23 0445  HGB 11.7* 11.4* 12.2*  HCT 36.1* 35.5* 37.5*  PLT 296 269 282  LABPROT 14.7 15.7* 17.7*  INR 1.1 1.2 1.4*  HEPARINUNFRC <0.10* <0.10* <0.10*  CREATININE 0.83 0.67 0.72  CKTOTAL  --  64  --     Estimated Creatinine Clearance: 115 mL/min (by C-G formula based on SCr of 0.72 mg/dL).   Medical History: Past Medical History:  Diagnosis Date   Acid reflux    ADHD (attention deficit hyperactivity disorder)    Asthma    Back pain    Seizures (HCC)    Resolved    Medications:  Infusions:    ceFAZolin (ANCEF) IV Stopped (10/09/23 6213)   heparin 500 Units/hr (10/09/23 0865)    Assessment: 37 yo male with LVAD, admitted with driveline infection. On warfarin PTA - PTA regimen is 2mg  daily except 3 mg MF. INR on admit therapeutic at 2.  INR remains low at 1.4 on low dose warfarin  - with goal about 1.5 while in/out OR for I&D/washout/VAC changes   On heparin infusion at 500 units/hr (no titration) while INR < 1,8. Hgb 11 -12 stable, plt 200s stable LDH 100s stable.  Goal of Therapy: INR 2-2.5  Heparin level 0.2-0.3 units/ml Monitor platelets by anticoagulation protocol: Yes   Plan:  Continue heparin 500 units/hr  Warfarin 2 mg x1 tonight  Monitor daily CBC, protime and heparin level, and for s/sx of bleeding  Leota Sauers Pharm.D. CPP,  BCPS Clinical Pharmacist [phone] 10/09/2023 9:10 AM    Please check AMION for all [facility] Pharmacy phone numbers After 10:00 PM, call Main Pharmacy [phone]

## 2023-10-09 NOTE — Anesthesia Preprocedure Evaluation (Signed)
Anesthesia Evaluation  Patient identified by MRN, date of birth, ID band Patient awake    Reviewed: Allergy & Precautions, NPO status , Patient's Chart, lab work & pertinent test results, reviewed documented beta blocker date and time   History of Anesthesia Complications Negative for: history of anesthetic complications  Airway Mallampati: III  TM Distance: >3 FB Neck ROM: Full    Dental  (+) Dental Advisory Given   Pulmonary neg shortness of breath, asthma , neg COPD, Current Smoker and Patient abstained from smoking., neg PE   breath sounds clear to auscultation       Cardiovascular Exercise Tolerance: Poor +CHF  (-) Valvular Problems/Murmurs Rhythm:Regular  S/p LVAD, with moderate RV dysfunction. Stable LVAD settings.    Neuro/Psych Seizures -, Well Controlled,  PSYCHIATRIC DISORDERS Anxiety        GI/Hepatic ,GERD  ,,(+) neg Cirrhosis        Endo/Other  neg diabetes    Renal/GU Renal disease  negative genitourinary   Musculoskeletal negative musculoskeletal ROS (+)    Abdominal Normal abdominal exam  (+)   Peds  Hematology   Anesthesia Other Findings   Reproductive/Obstetrics                              Anesthesia Physical Anesthesia Plan  ASA: 4  Anesthesia Plan: General   Post-op Pain Management:    Induction: Intravenous  PONV Risk Score and Plan: 1 and Ondansetron, Dexamethasone, Midazolam and Treatment may vary due to age or medical condition  Airway Management Planned: LMA, Mask and Oral ETT  Additional Equipment: None  Intra-op Plan:   Post-operative Plan: Extubation in OR  Informed Consent: I have reviewed the patients History and Physical, chart, labs and discussed the procedure including the risks, benefits and alternatives for the proposed anesthesia with the patient or authorized representative who has indicated his/her understanding and acceptance.      Dental advisory given  Plan Discussed with: CRNA  Anesthesia Plan Comments: (Possible art line)         Anesthesia Quick Evaluation

## 2023-10-09 NOTE — Progress Notes (Addendum)
Advanced Heart Failure VAD Team Note  PCP-Cardiologist: None   Subjective:   09/29/23: admitted with DL infection. CT A/P with fluid collection around driveline suggestive of abscess. Wound Cx + Staph Aureus  09/30/23: developed significant bleeding from DL site>>given FFP 40/9/81: I/D with CTS with wound vac placement. Surgical wound cultures 10/9 with MSSA 10/03/23: VAC change 10/15: I/D with CTS + VAC change  Remains on IV cefazolin. ID recommending 6 weeks IV antibiotics.   Receiving dilaudid q 2hrs overnight for surgical pain, tramadol has not been effective.    LVAD INTERROGATION:  HeartMate II LVAD:   Flow 4.3 liters/min, speed 5600, power 4, PI 3.5. 27 PI events.  VAD interrogated personally. Parameters stable.  Objective:    Vital Signs:   Temp:  [98.3 F (36.8 C)-98.7 F (37.1 C)] 98.3 F (36.8 C) (10/17 0723) Pulse Rate:  [81-98] 82 (10/17 0520) Resp:  [14-40] 16 (10/17 0723) BP: (82-112)/(56-90) 82/58 (10/17 0723) SpO2:  [94 %-100 %] 100 % (10/17 0723) Last BM Date : 10/08/23 VAD Mean arterial Pressure  80s-90s  Intake/Output:   Intake/Output Summary (Last 24 hours) at 10/09/2023 0750 Last data filed at 10/09/2023 0608 Gross per 24 hour  Intake 667.56 ml  Output 2025 ml  Net -1357.44 ml     Physical Exam   Physical Exam: GENERAL: Sitting up on side of bed. No distress HEENT: normal  NECK: Supple, JVP not elevated  CARDIAC:  Mechanical heart sounds with LVAD hum present.  LUNGS:  Clear to auscultation bilaterally.  ABDOMEN:  Soft, round, nondistended LVAD exit site:   VAC with good seal. EXTREMITIES:  Warm and dry, no cyanosis, clubbing, rash or edema  NEUROLOGIC:  Alert and oriented x 4.  Affect pleasant.      Telemetry   SR 80s  Labs   Basic Metabolic Panel: Recent Labs  Lab 10/05/23 0609 10/06/23 0500 10/07/23 0540 10/08/23 0415 10/09/23 0445  NA 138 141 135 137 136  K 4.0 3.7 3.9 3.8 3.7  CL 99 103 98 100 96*  CO2 31 28 28 28  28   GLUCOSE 88 117* 98 95 92  BUN 12 14 14 14 10   CREATININE 0.82 1.04 0.83 0.67 0.72  CALCIUM 9.1 8.8* 9.2 9.2 9.3    Liver Function Tests: No results for input(s): "AST", "ALT", "ALKPHOS", "BILITOT", "PROT", "ALBUMIN" in the last 168 hours.  No results for input(s): "LIPASE", "AMYLASE" in the last 168 hours. No results for input(s): "AMMONIA" in the last 168 hours.  CBC: Recent Labs  Lab 10/05/23 0609 10/06/23 0500 10/07/23 0540 10/08/23 0415 10/09/23 0445  WBC 5.7 6.1 8.3 7.9 7.8  HGB 11.9* 11.8* 11.7* 11.4* 12.2*  HCT 36.7* 36.5* 36.1* 35.5* 37.5*  MCV 88.9 88.8 86.2 86.2 86.6  PLT 270 283 296 269 282    INR: Recent Labs  Lab 10/05/23 0609 10/06/23 0500 10/07/23 0540 10/08/23 0415 10/09/23 0445  INR 1.1 1.1 1.1 1.2 1.4*    Other results:    Imaging   No results found.   Medications:     Scheduled Medications:  digoxin  0.125 mg Oral Daily   feeding supplement  237 mL Oral TID WC   ivabradine  5 mg Oral BID WC   losartan  25 mg Oral BID   mexiletine  200 mg Oral BID   pantoprazole  40 mg Oral Daily   polyethylene glycol  17 g Oral Daily   senna-docusate  1 tablet Oral BID   sertraline  25 mg Oral Daily   sodium chloride flush  10-40 mL Intracatheter Q12H   traMADol  100 mg Oral Q6H   Warfarin - Pharmacist Dosing Inpatient   Does not apply q1600   zinc sulfate  220 mg Oral Daily    Infusions:   ceFAZolin (ANCEF) IV Stopped (10/09/23 0508)   heparin 500 Units/hr (10/09/23 1610)    PRN Medications: acetaminophen, albuterol, HYDROmorphone (DILAUDID) injection, mouth rinse, oxyCODONE, sodium chloride flush, sorbitol, traZODone  Assessment/Plan:   1. Acute Driveline Infection - Prior clinic visit 9/30 DL site was noted by VAD RN to be not completely incorporated. Long discussion about strategies to protect DL and keep it immobilized. Reported mild itching and redness at the time. - Now admitted with driveline infection.  - CT  chest/abd/pelvis showed abscess  - Wound Cx + Staph Aureus - 10/9 debridement + wound vac>>Surgical wound cultures 10/9 with MSSA - BCx NGTD  - ID consulted: Dapto transitioned to IV cefazolin with MSSA - 10/11 wound vac changed - 10/15 I&D + VAC change - Back to OR on 10/22 - Appreciate ID. Continue cefazolin X 6 weeks (end date 11/18/23). Home infusion rep contacted. Will need to exchange PICC for single lumen prior to discharge. - Receiving 2 mg dilaudid q 2hrs overnight d/t surgical pain. Regimen reviewed with Dr. Maren Beach. Will reduce frequency of dilaudid to q 3hrs for today, then q 4hrs tomorrow. Tramadol has been ineffective.    2. 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 ? 2/2 PVCs vs genetically mediated +/- hypertension.  - LHC: normal coronaries.  - cMRI:  LV markedly dilated EF 10% RV 17% NICM - Underwent HM-III VAD on 07/28/25. Much improved NYHA I-II  - INR 1.4. today. Goal 2.0-3.0. Continue Warfarin + Heparin gtt.  - Volume ok. MAP 80s-90s. May need to add amlodipine if MAP continues to trend up. 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 Tobacco use - reports quit smoking 7/24 - Cotinine level at 30.1 09/22/23  5. Hx drug abuse - Continue suboxone as outpatient. Currently on hold. Resume prior to discharge   I reviewed the LVAD parameters from today, and compared the results to the patient's prior recorded data.  No programming changes were made.  The LVAD is functioning within specified parameters.  The patient performs LVAD self-test daily.  LVAD interrogation was negative for any significant power changes, alarms or PI events/speed drops.  LVAD equipment check completed and is in good working order.  Back-up equipment present.    LVAD education done on emergency procedures and precautions and reviewed exit site care.  Length of Stay: [address] PA-C 10/09/2023, 7:50 AM  VAD Team --- VAD ISSUES ONLY--- Pager [phone] (7am - 7am)  Advanced Heart Failure Team  Pager [phone] (M-F; 7a - 5p)  Please contact [facility] for night-coverage after hours (5p -7a ) and weekends on [email or web address]  Patient seen and examined with the above-signed Advanced Practice Provider and/or Housestaff. I personally reviewed laboratory data, imaging studies and relevant notes. I independently examined the patient and formulated the important aspects of the plan. I have edited the note to reflect any of my changes or salient points. I have personally discussed the plan with  the patient and/or family.  Continues on IV abx. Afebrile. Wound vac working well. Still c/o pain at wound site and asking for dilaudid q2hr. Says tramadol doesn't work.   General:  NAD.  HEENT: normal  Neck: supple. JVP not elevated.  Carotids 2+ bilat; no bruits. No lymphadenopathy or thryomegaly appreciated. Cor: LVAD hum.  Lungs: Clear. Abdomen: soft, nontender, non-distended. No hepatosplenomegaly. No bruits or masses. Good bowel sounds. Wound vac sites ok Driveline site clean. Anchor in place.  Extremities: no cyanosis, clubbing, rash. Warm no edema  Neuro: alert & oriented x 3. No focal deficits. Moves all 4 without problem   Infection seems under good control. VAC working well. Continue IV abx.   Continue heparin/warfarin. Continue to push to get INR closer to 1.5-1.8  Discussed warfarin dosing with PharmD personally.  I am concerned that the amount of pain medication he is requesting is far out proportion to what other patients have required in this situation and likely significant drug-seeking behavior is present. Need to wean pain meds significantly. Would suggest we get him back on suboxone and stop dialudid  VAD interrogated personally.  Parameters stable.  [name], MD  6:27 PM

## 2023-10-09 NOTE — Anesthesia Postprocedure Evaluation (Signed)
Anesthesia Post Note  Patient: Joel York  Procedure(s) Performed: Wound Vac Change Wound Irrigation     Patient location during evaluation: PACU Anesthesia Type: General Level of consciousness: awake and alert Pain management: pain level controlled Vital Signs Assessment: post-procedure vital signs reviewed and stable Respiratory status: spontaneous breathing, nonlabored ventilation, respiratory function stable and patient connected to nasal cannula oxygen Cardiovascular status: blood pressure returned to baseline and stable Postop Assessment: no apparent nausea or vomiting Anesthetic complications: no   No notable events documented.                 Shaunte Weissinger

## 2023-10-10 DIAGNOSIS — T827XXA Infection and inflammatory reaction due to other cardiac and vascular devices, implants and grafts, initial encounter: Secondary | ICD-10-CM | POA: Diagnosis not present

## 2023-10-10 DIAGNOSIS — Z95811 Presence of heart assist device: Secondary | ICD-10-CM | POA: Diagnosis not present

## 2023-10-10 LAB — BASIC METABOLIC PANEL
Anion gap: 10 (ref 5–15)
BUN: 14 mg/dL (ref 6–20)
CO2: 29 mmol/L (ref 22–32)
Calcium: 9 mg/dL (ref 8.9–10.3)
Chloride: 96 mmol/L — ABNORMAL LOW (ref 98–111)
Creatinine, Ser: 0.76 mg/dL (ref 0.61–1.24)
GFR, Estimated: >60 mL/min (ref >60)
Glucose, Bld: 109 mg/dL — ABNORMAL HIGH (ref 70–99)
Potassium: 3.7 mmol/L (ref 3.5–5.1)
Sodium: 135 mmol/L (ref 135–145)

## 2023-10-10 LAB — PROTIME-INR
INR: 1.5 — ABNORMAL HIGH (ref 0.8–1.2)
Prothrombin Time: 18.6 s — ABNORMAL HIGH (ref 11.4–15.2)

## 2023-10-10 LAB — HEPARIN LEVEL (UNFRACTIONATED): Heparin Unfractionated: <0.1 [IU]/mL — ABNORMAL LOW (ref 0.30–0.70)

## 2023-10-10 LAB — LACTATE DEHYDROGENASE: LDH: 171 U/L (ref 98–192)

## 2023-10-10 MED ORDER — BUPRENORPHINE HCL-NALOXONE HCL 2-0.5 MG SL SUBL
1.0000 | SUBLINGUAL_TABLET | Freq: Every day | SUBLINGUAL | Status: DC
  Administered 2023-10-11 – 2023-10-13 (×3): 1 via SUBLINGUAL
  Filled 2023-10-10 (×3): qty 1

## 2023-10-10 MED ORDER — OXYCODONE HCL 5 MG PO TABS
5.0000 mg | ORAL_TABLET | Freq: Four times a day (QID) | ORAL | Status: DC | PRN
  Administered 2023-10-10 – 2023-10-12 (×6): 5 mg via ORAL
  Filled 2023-10-10 (×6): qty 1

## 2023-10-10 MED ORDER — METOCLOPRAMIDE HCL 5 MG/ML IJ SOLN: 10.0000 mg | Freq: Three times a day (TID) | INTRAMUSCULAR | Status: DC

## 2023-10-10 MED ORDER — WARFARIN SODIUM 2 MG PO TABS
2.0000 mg | ORAL_TABLET | Freq: Every day | ORAL | Status: DC
  Administered 2023-10-10 – 2023-10-12 (×3): 2 mg via ORAL
  Filled 2023-10-10 (×3): qty 1

## 2023-10-10 MED ORDER — LACTATED RINGERS IV BOLUS: 1000.0000 mL | Freq: Once | INTRAVENOUS | Status: DC

## 2023-10-10 MED ORDER — HYDROMORPHONE HCL 1 MG/ML IJ SOLN
2.0000 mg | INTRAMUSCULAR | Status: DC | PRN
  Administered 2023-10-10 – 2023-10-11 (×5): 2 mg via INTRAVENOUS
  Filled 2023-10-10 (×5): qty 2

## 2023-10-10 MED ORDER — ACETAMINOPHEN 500 MG PO TABS
1000.0000 mg | ORAL_TABLET | Freq: Four times a day (QID) | ORAL | Status: DC
  Administered 2023-10-10 – 2023-10-16 (×21): 1000 mg via ORAL
  Filled 2023-10-10 (×23): qty 2

## 2023-10-10 NOTE — Progress Notes (Addendum)
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  Pt lying in bed on my arrival. Denies complaints. Continues to have pain at wound site. Taking PRN Dilaudid.   This morning wound vac therapy transitioned to negative pressure therapy only per Dr Donata Clay. Seal maintained. No leak alarms noted.   Plan to return to OR next Tuesday for washout/wound vac change with Dr Donata Clay.   Vital signs: Temp: 98.7 HR: 95 Doppler Pressure: 86 Auto BP: 100/85 (91) O2 Sat: 98% on RA Wt: 143.3>139.1>139.5>141.3>140.4>139.7>137.7 lbs    LVAD interrogation reveals:  Speed: 5600 Flow: 4.2 Power: 4.3 w PI: 4.2  Alarms: none Events: none Hematocrit: 38  Fixed speed: 5600 Low speed limit: 5300  Drive Line: wound vac at -125.  Anchor secure. ~ 100 cc in canister. No alarms noted.  Labs:  LDH trend: 129>142>136>139>155>194>210>181>171  INR trend: 2.2>1.4>1.1>1.1>1.1>1.2>1.4>1.5  WBC trend: 8.6>7.7>7.3>5.7>6.1>8.3>7.9>7.8  Anticoagulation Plan: -INR Goal: 2-2.5 -ASA Dose: off  Blood Products:  09/30/23: 2 FFP  Infection:  09/29/23>>blood cxs x 2>> no growth x 1 days 09/29/23>>driveline cx>>MODERATE STAPHYLOCOCCUS AUREUS  10/01/23>>driveline cx OR>FEW STAPHYLOCOCCUS AUREUS; final 10/01/23>> AFB cx OR> negative; final 10/01/23> Fungus cx OR> negative; final   Adverse Events on VAD: -  Plan/Recommendations:  1. Please page VAD coordinator for any alarms or VAD equipment issues. 2. Wound VAC set to negative pressure therapy, page VAD coordinator for any issues 3. Pt to scheduled to return to OR next Tuesday with Dr Donata Clay. VAD coordinator will accompany.   Alyce Pagan RN VAD Coordinator  Office: [phone]  24/7 Pager: [phone]

## 2023-10-10 NOTE — Progress Notes (Addendum)
Advanced Heart Failure VAD Team Note  PCP-Cardiologist: None   Subjective:   09/29/23: admitted with DL infection. CT A/P with fluid collection around driveline suggestive of abscess. Wound Cx + Staph Aureus  09/30/23: developed significant bleeding from DL site>>given FFP 16/1/09: I/D with CTS with wound vac placement. Surgical wound cultures 10/9 with MSSA 10/03/23: VAC change 10/15: I/D with CTS + VAC change  Remains on IV cefazolin. ID recommending 6 weeks IV antibiotics. Afebrile. WBC nl.   INR trending up, 1.5 today   Continues to complain of abdominal pain. Per MAR, asked for and received PRN dilaudid q3hr through the night. Defensive about order being changed to Q4 this morning.   No other complaints. Denies f/c. Wife and son at bedside.    LVAD INTERROGATION:  HeartMate II LVAD:   Flow 4.2 liters/min, speed 5600, power 4.0 PI 3.2.  VAD interrogated personally. Parameters stable.  Objective:    Vital Signs:   Temp:  [97.8 F (36.6 C)-98.7 F (37.1 C)] 98.7 F (37.1 C) (10/18 0721) Pulse Rate:  [84-102] 102 (10/18 0721) Resp:  [12-20] 20 (10/18 0721) BP: (93-126)/(39-91) 93/75 (10/18 0722) SpO2:  [98 %-100 %] 98 % (10/18 0721) Weight:  [62.5 kg-63.4 kg] 62.5 kg (10/18 0548) Last BM Date : 10/08/23 VAD Mean arterial Pressure  80  Intake/Output:   Intake/Output Summary (Last 24 hours) at 10/10/2023 0803 Last data filed at 10/10/2023 0500 Gross per 24 hour  Intake 611.18 ml  Output 405 ml  Net 206.18 ml     Physical Exam   GENERAL: well appearing. No distress  HEENT: normal  NECK: Supple, no JVD elevation  CARDIAC:  Mechanical heart sounds with LVAD hum present.  LUNGS:  CTAG ABDOMEN:  Soft, round, non-distended + BS  LVAD exit site:   VAC with good seal. EXTREMITIES:  Warm and dry, no cyanosis, clubbing, rash or edema  NEUROLOGIC:  Alert and oriented x 4.  Affect pleasant.      Telemetry   NSR 80s   Labs   Basic Metabolic Panel: Recent Labs   Lab 10/06/23 0500 10/07/23 0540 10/08/23 0415 10/09/23 0445 10/10/23 0450  NA 141 135 137 136 135  K 3.7 3.9 3.8 3.7 3.7  CL 103 98 100 96* 96*  CO2 28 28 28 28 29   GLUCOSE 117* 98 95 92 109*  BUN 14 14 14 10 14   CREATININE 1.04 0.83 0.67 0.72 0.76  CALCIUM 8.8* 9.2 9.2 9.3 9.0    Liver Function Tests: No results for input(s): "AST", "ALT", "ALKPHOS", "BILITOT", "PROT", "ALBUMIN" in the last 168 hours.  No results for input(s): "LIPASE", "AMYLASE" in the last 168 hours. No results for input(s): "AMMONIA" in the last 168 hours.  CBC: Recent Labs  Lab 10/05/23 0609 10/06/23 0500 10/07/23 0540 10/08/23 0415 10/09/23 0445  WBC 5.7 6.1 8.3 7.9 7.8  HGB 11.9* 11.8* 11.7* 11.4* 12.2*  HCT 36.7* 36.5* 36.1* 35.5* 37.5*  MCV 88.9 88.8 86.2 86.2 86.6  PLT 270 283 296 269 282    INR: Recent Labs  Lab 10/06/23 0500 10/07/23 0540 10/08/23 0415 10/09/23 0445 10/10/23 0450  INR 1.1 1.1 1.2 1.4* 1.5*    Other results:    Imaging   No results found.   Medications:     Scheduled Medications:  digoxin  0.125 mg Oral Daily   feeding supplement  237 mL Oral TID WC   ivabradine  5 mg Oral BID WC   losartan  25 mg Oral BID  mexiletine  200 mg Oral BID   pantoprazole  40 mg Oral Daily   polyethylene glycol  17 g Oral BID   senna-docusate  2 tablet Oral BID   sertraline  25 mg Oral Daily   sodium chloride flush  10-40 mL Intracatheter Q12H   traMADol  100 mg Oral Q6H   Warfarin - Pharmacist Dosing Inpatient   Does not apply q1600   zinc sulfate  220 mg Oral Daily    Infusions:   ceFAZolin (ANCEF) IV 2 g (10/10/23 0440)   heparin 500 Units/hr (10/09/23 1621)    PRN Medications: acetaminophen, albuterol, HYDROmorphone (DILAUDID) injection, mouth rinse, oxyCODONE, sodium chloride flush, sorbitol, traZODone  Assessment/Plan:   1. Acute Driveline Infection - Prior clinic visit 9/30 DL site was noted by VAD RN to be not completely incorporated. Long  discussion about strategies to protect DL and keep it immobilized. Reported mild itching and redness at the time. - Now admitted with driveline infection.  - CT chest/abd/pelvis showed abscess  - Wound Cx + Staph Aureus - 10/9 debridement + wound vac>>Surgical wound cultures 10/9 with MSSA - BCx NGTD  - ID consulted: Dapto transitioned to IV cefazolin with MSSA - 10/11 wound vac changed - 10/15 I&D + VAC change - Plan back to OR on 10/22 - Appreciate ID. Continue cefazolin X 6 weeks (end date 11/18/23). Home infusion rep contacted. Will need to exchange PICC for single lumen prior to discharge. - Receiving 2 mg dilaudid q 3hrs overnight d/t surgical pain. Regimen reviewed with Dr. Maren Beach. Plan to wean dilaudid off over the next 24 hrs. Reduce to 2 mg IV q4h today then off     2. 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 ? 2/2 PVCs vs genetically mediated +/- hypertension.  - LHC: normal coronaries.  - cMRI:  LV markedly dilated EF 10% RV 17% NICM - Underwent HM-III VAD on 07/28/25. Much improved NYHA I-II  - INR 1.5. today. Goal 2.0-3.0. Continue Warfarin + Heparin gtt.  - Volume ok. 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 Tobacco use - reports quit smoking 7/24 - Cotinine level at 30.1 09/22/23  5. Hx drug abuse - Continue suboxone as outpatient. Currently on hold. Resume prior to discharge   I reviewed the LVAD parameters from today, and compared the results to the patient's prior recorded data.  No programming changes were made.  The LVAD is functioning within specified parameters.  The patient performs LVAD self-test daily.  LVAD interrogation was negative for any significant power changes, alarms or PI events/speed drops.  LVAD  equipment check completed and is in good working order.  Back-up equipment present.   LVAD education done on emergency procedures and precautions and reviewed exit site care.  Length of Stay: [address] PA-C 10/10/2023, 8:03 AM  VAD Team --- VAD ISSUES ONLY--- Pager [phone] (7am - 7am)  Advanced Heart Failure Team  Pager [phone] (M-F; 7a - 5p)  Please contact [facility] for night-coverage after hours (5p -7a ) and weekends on [email or web address]   Patient seen and examined with the above-signed Advanced Practice Provider and/or Housestaff. I personally reviewed laboratory data, imaging studies and relevant notes. I independently examined the patient  and formulated the important aspects of the plan. I have edited the note to reflect any of my changes or salient points. I have personally discussed the plan with the patient and/or family.  Remains on IV abx. No fevers or chills. C/o pain at DL site despite frequent IV narcotic dosing.   On heparin/warfarin INR 1.5. No bleeding   General:  Sitting up on side of bed. NAD.  HEENT: normal  Neck: supple. JVP not elevated.  Carotids 2+ bilat; no bruits. No lymphadenopathy or thryomegaly appreciated. Cor: LVAD hum.  Lungs: Clear. Abdomen: soft, nontender, non-distended. No hepatosplenomegaly. No bruits or masses. Good bowel sounds. Driveline site with wound vac x2 with good seal . Anchor in place.  Extremities: no cyanosis, clubbing, rash. Warm no edema  Neuro: alert & oriented x 3. No focal deficits. Moves all 4 without problem    Continue IV abx and wound vac. Continue heparin/warfarin. Push toward INR 2.0. For OR next Tuesday for repeat washout.   We discussed need to taper down IV pain meds and get him back on home suboxone. He currently has his home suboxone in the room and this was discussed with PharmD and that is against hospital policy. Will remove meds from the room and have Pharmacy dose.   VAD interrogated personally.  Parameters stable.  [name], MD  5:50 PM

## 2023-10-10 NOTE — Plan of Care (Signed)
[facility] Tonsillectomy/Adenoidectomy, Postoperative PEDS care plan entered in error.

## 2023-10-10 NOTE — Progress Notes (Signed)
PHARMACY - ANTICOAGULATION CONSULT NOTE  Pharmacy Consult for Heparin infusion + warfarin  Indication:  LVAD  Allergies  Allergen Reactions   Other Anaphylaxis    Tree Nuts   Peanuts [Peanut Oil] Anaphylaxis   Chlorhexidine Rash    Patient Measurements: Height: 5\' 7"  (170.2 cm) Weight: 62.5 kg (137 lb 12.6 oz) IBW/kg (Calculated) : 66.1 Heparin Dosing Weight: 63.1 kg  Vital Signs: Temp: 98.7 F (37.1 C) (10/18 1112) Temp Source: Oral (10/18 1112) BP: 127/116 (10/18 1112) Pulse Rate: 100 (10/18 1112)  Labs: Recent Labs    10/08/23 0415 10/09/23 0445 10/10/23 0450  HGB 11.4* 12.2*  --   HCT 35.5* 37.5*  --   PLT 269 282  --   LABPROT 15.7* 17.7* 18.6*  INR 1.2 1.4* 1.5*  HEPARINUNFRC <0.10* <0.10* <0.10*  CREATININE 0.67 0.72 0.76  CKTOTAL 64  --   --     Estimated Creatinine Clearance: 112.8 mL/min (by C-G formula based on SCr of 0.76 mg/dL).   Medical History: Past Medical History:  Diagnosis Date   Acid reflux    ADHD (attention deficit hyperactivity disorder)    Asthma    Back pain    Seizures (HCC)    Resolved    Medications:  Infusions:    ceFAZolin (ANCEF) IV 200 mL/hr at 10/10/23 1254   heparin 500 Units/hr (10/10/23 1254)    Assessment: 37 yo male with LVAD, admitted with driveline infection. On warfarin PTA - PTA regimen is 2mg  daily except 3 mg MF. INR on admit therapeutic at 2.  INR remains low at 1.5 on low dose warfarin  - with goal about 1.5 while in/out OR for I&D/washout/VAC changes   On heparin infusion at 500 units/hr (no titration) while INR < 1.8. Hgb 11 -12 stable, plt 200s stable LDH 100s stable.  Goal of Therapy: INR 2-2.5  Heparin level 0.2-0.3 units/ml Monitor platelets by anticoagulation protocol: Yes   Plan:  Continue heparin 500 units/hr  Warfarin 2 mg daily Monitor daily CBC, protime and heparin level, and for s/sx of bleeding  Leota Sauers Pharm.D. CPP, BCPS Clinical Pharmacist [phone] 10/10/2023 2:45  PM    Please check AMION for all [facility] phone numbers After 10:00 PM, call Main Pharmacy [phone]

## 2023-10-10 NOTE — Progress Notes (Signed)
3 Days Post-Op Procedure(s) (LRB): VAD TUNNEL DEBRIDEMENT USING MYRIAD MORCELLS FINE (N/A) WOUND VAC CHANGE (N/A) Subjective: Patient complaining of pain and having his scheduled IV Dilaudid dosing interrupted Wound VAC installation circuit appears to be obstructing drainage and will be transitioned to the full suction VAC mode off the  Veroflow mode. Plan for pain control is to increase the IV Dilaudid to every 4 hours as needed for 24 hours then transition to his baseline Suboxone schedule he was on at home.  Patient will have wound washout and wound VAC removal in the OR Tuesday, October 22.  Hope to transition to daily wet-to-dry Vashe dressing changes as the wound has started to heal and contract.  Objective: Vital signs in last 24 hours: Temp:  [97.8 F (36.6 C)-98.7 F (37.1 C)] 98.7 F (37.1 C) (10/18 1112) Pulse Rate:  [84-102] 100 (10/18 1112) Cardiac Rhythm: Normal sinus rhythm (10/18 0704) Resp:  [12-20] 13 (10/18 1112) BP: (93-127)/(75-116) 127/116 (10/18 1112) SpO2:  [97 %-100 %] 97 % (10/18 1112) Weight:  [62.5 kg-63.4 kg] 62.5 kg (10/18 0548)  Hemodynamic parameters for last 24 hours:    Intake/Output from previous day: 10/17 0701 - 10/18 0700 In: 617.6 [P.O.:240; I.V.:114.3; IV Piggyback:263.3] Out: 405 [Urine:400; Drains:5] Intake/Output this shift: Total I/O In: 256.8 [P.O.:240; I.V.:16.8] Out: -   Exam  Alert up walking in the room Lungs clear Wound VAC sponges compressed but minimal if any drainage is being expressed \\Normal  VAD hum  Lab Results: Recent Labs    10/08/23 0415 10/09/23 0445  WBC 7.9 7.8  HGB 11.4* 12.2*  HCT 35.5* 37.5*  PLT 269 282   BMET:  Recent Labs    10/09/23 0445 10/10/23 0450  NA 136 135  K 3.7 3.7  CL 96* 96*  CO2 28 29  GLUCOSE 92 109*  BUN 10 14  CREATININE 0.72 0.76  CALCIUM 9.3 9.0    PT/INR:  Recent Labs    10/10/23 0450  LABPROT 18.6*  INR 1.5*   ABG    Component Value Date/Time   PHART  7.510 (H) 08/03/2023 0445   HCO3 27.3 08/03/2023 0445   TCO2 28 08/03/2023 0445   ACIDBASEDEF 2.0 07/31/2023 1704   O2SAT 68.1 08/08/2023 0535   CBG (last 3)  No results for input(s): "GLUCAP" in the last 72 hours.  Assessment/Plan: S/P Procedure(s) (LRB): VAD TUNNEL DEBRIDEMENT USING MYRIAD MORCELLS FINE (N/A) WOUND VAC CHANGE (N/A) 1 more return to the OR for wound care  10/22 then transition to daily dressing changes as an outpatient. Patient's wife will need to be educated on how to perform sterile dressing changes. Continue IV Ancef through PICC line at home Pain control has been an issue after surgical debridements but should improve as the wound heals and becomes more superficial. Continue with Coumadin loading with transition low-dose IV heparin 500 units/h to be stopped when INR is therapeutic.   LOS: 11 days    Lovett Sox 10/10/2023

## 2023-10-10 NOTE — Plan of Care (Signed)
  Problem: Education: Goal: Patient will understand all VAD equipment and how it functions Outcome: Progressing Goal: Patient will be able to verbalize current INR target range and antiplatelet therapy for discharge home Outcome: Progressing   Problem: Cardiac: Goal: LVAD will function as expected and patient will experience no clinical alarms Outcome: Progressing   Problem: Education: Goal: Knowledge of General Education information will improve Description: Including pain rating scale, medication(s)/side effects and non-pharmacologic comfort measures Outcome: Progressing   Problem: Health Behavior/Discharge Planning: Goal: Ability to manage health-related needs will improve Outcome: Progressing   Problem: Clinical Measurements: Goal: Ability to maintain clinical measurements within normal limits will improve Outcome: Progressing Goal: Will remain free from infection Outcome: Progressing Goal: Diagnostic test results will improve Outcome: Progressing Goal: Respiratory complications will improve Outcome: Progressing Goal: Cardiovascular complication will be avoided Outcome: Progressing   Problem: Activity: Goal: Risk for activity intolerance will decrease Outcome: Progressing   Problem: Nutrition: Goal: Adequate nutrition will be maintained Outcome: Progressing   Problem: Coping: Goal: Level of anxiety will decrease Outcome: Progressing   Problem: Elimination: Goal: Will not experience complications related to bowel motility Outcome: Progressing Goal: Will not experience complications related to urinary retention Outcome: Progressing   Problem: Pain Managment: Goal: General experience of comfort will improve Outcome: Progressing   Problem: Safety: Goal: Ability to remain free from injury will improve Outcome: Progressing   Problem: Skin Integrity: Goal: Risk for impaired skin integrity will decrease Outcome: Progressing   Problem: Education: Goal:  Understanding of post-operative needs will improve Outcome: Progressing Goal: Individualized Educational Video(s) Outcome: Progressing   Problem: Clinical Measurements: Goal: Postoperative complications will be avoided or minimized Outcome: Progressing   Problem: Respiratory: Goal: Will regain and/or maintain adequate ventilation Outcome: Progressing

## 2023-10-11 DIAGNOSIS — T827XXA Infection and inflammatory reaction due to other cardiac and vascular devices, implants and grafts, initial encounter: Secondary | ICD-10-CM | POA: Diagnosis not present

## 2023-10-11 DIAGNOSIS — Z95811 Presence of heart assist device: Secondary | ICD-10-CM | POA: Diagnosis not present

## 2023-10-11 LAB — PROTIME-INR
INR: 1.5 — ABNORMAL HIGH (ref 0.8–1.2)
Prothrombin Time: 18.2 s — ABNORMAL HIGH (ref 11.4–15.2)

## 2023-10-11 LAB — BASIC METABOLIC PANEL
Anion gap: 11 (ref 5–15)
BUN: 11 mg/dL (ref 6–20)
CO2: 28 mmol/L (ref 22–32)
Calcium: 9.5 mg/dL (ref 8.9–10.3)
Chloride: 98 mmol/L (ref 98–111)
Creatinine, Ser: 0.77 mg/dL (ref 0.61–1.24)
GFR, Estimated: >60 mL/min (ref >60)
Glucose, Bld: 84 mg/dL (ref 70–99)
Potassium: 3.8 mmol/L (ref 3.5–5.1)
Sodium: 137 mmol/L (ref 135–145)

## 2023-10-11 LAB — LACTATE DEHYDROGENASE: LDH: 168 U/L (ref 98–192)

## 2023-10-11 LAB — HEPARIN LEVEL (UNFRACTIONATED): Heparin Unfractionated: <0.1 [IU]/mL — ABNORMAL LOW (ref 0.30–0.70)

## 2023-10-11 NOTE — Progress Notes (Signed)
Advanced Heart Failure VAD Team Note  PCP-Cardiologist: None   Subjective:   09/29/23: admitted with DL infection. CT A/P with fluid collection around driveline suggestive of abscess. Wound Cx + Staph Aureus  09/30/23: developed significant bleeding from DL site>>given FFP 84/1/32: I/D with CTS with wound vac placement. Surgical wound cultures 10/9 with MSSA 10/03/23: VAC change 10/15: I/D with CTS + VAC change  Remains on IV cefazolin. ID recommending 6 weeks IV antibiotics.   Denies fevers or chills. Wound vac working well   Remains on heparin/warfarin. INR stable at 1.5  Dilaudid being weaned. Site pain improved on suboxone.   No other complaints. Denies f/c. Wife and son at bedside.    LVAD INTERROGATION:  HeartMate II LVAD:   Flow 3.8 liters/min, speed 5600, power 4.0  PI 5.5  VAD interrogated personally. Parameters stable.  Objective:    Vital Signs:   Temp:  [97.7 F (36.5 C)-98.7 F (37.1 C)] 97.8 F (36.6 C) (10/19 0830) Pulse Rate:  [60-101] 98 (10/19 0830) Resp:  [13-20] 16 (10/19 0830) BP: (81-127)/(62-116) 85/69 (10/19 0830) SpO2:  [96 %-98 %] 96 % (10/19 0830) Weight:  [62.4 kg] 62.4 kg (10/19 0437) Last BM Date : 10/09/23 VAD Mean arterial Pressure  70-90  Intake/Output:   Intake/Output Summary (Last 24 hours) at 10/11/2023 1045 Last data filed at 10/11/2023 0437 Gross per 24 hour  Intake 688.02 ml  Output --  Net 688.02 ml     Physical Exam   General:  NAD.  HEENT: normal  Neck: supple. JVP not elevated.  Carotids 2+ bilat; no bruits. No lymphadenopathy or thryomegaly appreciated. Cor: LVAD hum.  Lungs: Clear. Abdomen: soft, nontender, non-distended. No hepatosplenomegaly. No bruits or masses. Good bowel sounds. Driveline site with wound vac - good seal Anchor in place.  Extremities: no cyanosis, clubbing, rash. Warm no edema  Neuro: alert & oriented x 3. No focal deficits. Moves all 4 without problem    Telemetry   NSR 80-90s  Personally reviewed  Labs   Basic Metabolic Panel: Recent Labs  Lab 10/07/23 0540 10/08/23 0415 10/09/23 0445 10/10/23 0450 10/11/23 0454  NA 135 137 136 135 137  K 3.9 3.8 3.7 3.7 3.8  CL 98 100 96* 96* 98  CO2 28 28 28 29 28   GLUCOSE 98 95 92 109* 84  BUN 14 14 10 14 11   CREATININE 0.83 0.67 0.72 0.76 0.77  CALCIUM 9.2 9.2 9.3 9.0 9.5    Liver Function Tests: No results for input(s): "AST", "ALT", "ALKPHOS", "BILITOT", "PROT", "ALBUMIN" in the last 168 hours.  No results for input(s): "LIPASE", "AMYLASE" in the last 168 hours. No results for input(s): "AMMONIA" in the last 168 hours.  CBC: Recent Labs  Lab 10/05/23 0609 10/06/23 0500 10/07/23 0540 10/08/23 0415 10/09/23 0445  WBC 5.7 6.1 8.3 7.9 7.8  HGB 11.9* 11.8* 11.7* 11.4* 12.2*  HCT 36.7* 36.5* 36.1* 35.5* 37.5*  MCV 88.9 88.8 86.2 86.2 86.6  PLT 270 283 296 269 282    INR: Recent Labs  Lab 10/07/23 0540 10/08/23 0415 10/09/23 0445 10/10/23 0450 10/11/23 0454  INR 1.1 1.2 1.4* 1.5* 1.5*    Other results:    Imaging   No results found.   Medications:     Scheduled Medications:  acetaminophen  1,000 mg Oral Q6H   buprenorphine-naloxone  1 tablet Sublingual Daily   digoxin  0.125 mg Oral Daily   feeding supplement  237 mL Oral TID WC   ivabradine  5 mg Oral BID WC   losartan  25 mg Oral BID   mexiletine  200 mg Oral BID   pantoprazole  40 mg Oral Daily   polyethylene glycol  17 g Oral BID   senna-docusate  2 tablet Oral BID   sertraline  25 mg Oral Daily   sodium chloride flush  10-40 mL Intracatheter Q12H   traMADol  100 mg Oral Q6H   warfarin  2 mg Oral q1600   Warfarin - Pharmacist Dosing Inpatient   Does not apply q1600   zinc sulfate  220 mg Oral Daily    Infusions:   ceFAZolin (ANCEF) IV 2 g (10/11/23 0452)   heparin 500 Units/hr (10/11/23 0437)    PRN Medications: albuterol, HYDROmorphone (DILAUDID) injection, mouth rinse, oxyCODONE, sodium chloride flush,  sorbitol, traZODone  Assessment/Plan:    1. Acute Driveline Infection - Prior clinic visit 9/30 DL site was noted by VAD RN to be not completely incorporated. Long discussion about strategies to protect DL and keep it immobilized. Reported mild itching and redness at the time. - Now admitted with driveline infection.  - CT chest/abd/pelvis showed abscess  - Wound Cx + Staph Aureus - 10/9 debridement + wound vac>>Surgical wound cultures 10/9 with MSSA - BCx NGTD  - ID consulted: Dapto transitioned to IV cefazolin with MSSA - 10/11 wound vac changed - 10/15 I&D + VAC change - Remains on IV abx. No f/c. Wound vac working well.  - Plan back to OR on 10/22 - Appreciate ID. Continue cefazolin X 6 weeks (end date 11/18/23). Home infusion rep contacted. Will need to exchange PICC for single lumen prior to discharge. - Pain regimen being managed by Dr. Donata Clay - Suboxone restarted 10/18. Site pain ok   2. 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 ? 2/2 PVCs vs genetically mediated +/- hypertension.  - LHC: normal coronaries.  - cMRI:  LV markedly dilated EF 10% RV 17% NICM - Underwent HM-III VAD on 07/28/25. Much improved NYHA I-II  - INR stable at 1.5. today. Goal 2.0-3.0. Continue Warfarin + Heparin gtt. Discussed warfarin dosing with PharmD personally. - Volume ok. MAP 70-90s  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 Tobacco use - reports quit smoking 7/24 - Cotinine level at 30.1 09/22/23  5. Hx drug abuse - Plan as above   I reviewed the LVAD parameters from today, and compared the results to the patient's prior recorded data.  No programming changes were made.  The LVAD is functioning within specified parameters.  The patient performs LVAD  self-test daily.  LVAD interrogation was negative for any significant power changes, alarms or PI events/speed drops.  LVAD equipment check completed and is in good working order.  Back-up equipment present.   LVAD education done on emergency procedures and precautions and reviewed exit site care.  Length of Stay: 12  [name], MD 10/11/2023, 10:45 AM  VAD Team --- VAD ISSUES ONLY--- Pager [phone] (7am - 7am)  Advanced Heart Failure Team  Pager [phone] (M-F; 7a - 5p)  Please contact [facility] for night-coverage after hours (5p -7a ) and weekends on [email or web address]

## 2023-10-11 NOTE — Progress Notes (Signed)
PHARMACY - ANTICOAGULATION CONSULT NOTE  Pharmacy Consult for Heparin infusion + warfarin  Indication:  LVAD  Allergies  Allergen Reactions   Other Anaphylaxis    Tree Nuts   Peanuts [Peanut Oil] Anaphylaxis   Chlorhexidine Rash    Patient Measurements: Height: 5\' 7"  (170.2 cm) Weight: 62.4 kg (137 lb 9.1 oz) IBW/kg (Calculated) : 66.1 Heparin Dosing Weight: 63.1 kg  Vital Signs: Temp: 97.6 F (36.4 C) (10/19 1117) Temp Source: Oral (10/19 1117) BP: 90/69 (10/19 1117) Pulse Rate: 93 (10/19 1117)  Labs: Recent Labs    10/09/23 0445 10/10/23 0450 10/11/23 0454  HGB 12.2*  --   --   HCT 37.5*  --   --   PLT 282  --   --   LABPROT 17.7* 18.6* 18.2*  INR 1.4* 1.5* 1.5*  HEPARINUNFRC <0.10* <0.10* <0.10*  CREATININE 0.72 0.76 0.77    Estimated Creatinine Clearance: 112.7 mL/min (by C-G formula based on SCr of 0.77 mg/dL).   Medical History: Past Medical History:  Diagnosis Date   Acid reflux    ADHD (attention deficit hyperactivity disorder)    Asthma    Back pain    Seizures (HCC)    Resolved    Medications:  Infusions:    ceFAZolin (ANCEF) IV 2 g (10/11/23 0452)   heparin 500 Units/hr (10/11/23 0437)    Assessment: 37 yo male with LVAD, admitted with driveline infection. On warfarin PTA - PTA regimen is 2mg  daily except 3 mg MF. INR on admit therapeutic at 2.  INR remains low at 1.5 on low dose warfarin  - with goal about 1.5 while in/out OR for I&D/washout/VAC changes   On heparin infusion at 500 units/hr (no titration) while INR < 1.8. Hgb 11 -12 stable, plt 200s stable LDH 100s stable.  Goal of Therapy: INR 2-2.5  Heparin level 0.2-0.3 units/ml Monitor platelets by anticoagulation protocol: Yes   Plan:  Continue heparin 500 units/hr  Warfarin 2 mg daily Monitor daily CBC, protime and heparin level, and for s/sx of bleeding  Wilmer Floor, PharmD PGY2 Cardiology Pharmacy Resident 10/11/2023 11:49 AM    Please check AMION for all [facility]  Pharmacy phone numbers After 10:00 PM, call Main Pharmacy [phone]

## 2023-10-11 NOTE — Progress Notes (Signed)
Patient had part of his Picc Line dressing loose around the hub. Patient stated it itches so bad and he scratches around the line. He has an allergy to the Bio Patch so one was not placed on his site. His Stat lock was changed and site ws cleaned with antiseptic wipes. Patient tolerated the procedure well. New dressing was applied and dated 10/11/23.

## 2023-10-12 DIAGNOSIS — Z95811 Presence of heart assist device: Secondary | ICD-10-CM | POA: Diagnosis not present

## 2023-10-12 DIAGNOSIS — T827XXA Infection and inflammatory reaction due to other cardiac and vascular devices, implants and grafts, initial encounter: Secondary | ICD-10-CM | POA: Diagnosis not present

## 2023-10-12 LAB — BASIC METABOLIC PANEL
Anion gap: 9 (ref 5–15)
BUN: 10 mg/dL (ref 6–20)
CO2: 27 mmol/L (ref 22–32)
Calcium: 9.5 mg/dL (ref 8.9–10.3)
Chloride: 103 mmol/L (ref 98–111)
Creatinine, Ser: 0.82 mg/dL (ref 0.61–1.24)
GFR, Estimated: >60 mL/min (ref >60)
Glucose, Bld: 123 mg/dL — ABNORMAL HIGH (ref 70–99)
Potassium: 4 mmol/L (ref 3.5–5.1)
Sodium: 139 mmol/L (ref 135–145)

## 2023-10-12 LAB — HEPARIN LEVEL (UNFRACTIONATED): Heparin Unfractionated: <0.1 [IU]/mL — ABNORMAL LOW (ref 0.30–0.70)

## 2023-10-12 LAB — LACTATE DEHYDROGENASE: LDH: 165 U/L (ref 98–192)

## 2023-10-12 LAB — PROTIME-INR
INR: 1.5 — ABNORMAL HIGH (ref 0.8–1.2)
Prothrombin Time: 18.7 s — ABNORMAL HIGH (ref 11.4–15.2)

## 2023-10-12 MED ORDER — OXYCODONE HCL 5 MG PO TABS
5.0000 mg | ORAL_TABLET | Freq: Four times a day (QID) | ORAL | Status: DC | PRN
  Administered 2023-10-12: 10 mg via ORAL
  Administered 2023-10-12: 5 mg via ORAL
  Filled 2023-10-12: qty 1
  Filled 2023-10-12: qty 2

## 2023-10-12 NOTE — Progress Notes (Addendum)
Patient ID: Joel York, male   DOB: 1986/03/19, 37 y.o.   MRN: [id number]   Advanced Heart Failure VAD Team Note  PCP-Cardiologist: None   Subjective:   09/29/23: admitted with DL infection. CT A/P with fluid collection around driveline suggestive of abscess. Wound Cx + Staph Aureus  09/30/23: developed significant bleeding from DL site>>given FFP 95/6/21: I/D with CTS with wound vac placement. Surgical wound cultures 10/9 with MSSA 10/03/23: VAC change 10/15: I/D with CTS + VAC change  Remains on IV cefazolin. ID recommending 6 weeks IV antibiotics.   Denies fevers or chills. Wound vac obstructed this morning, per nursing it is very positional.   Remains on heparin/warfarin. INR stable at 1.5  Off Dilaudid, getting oxycodone prn and Tylenol as well as Suboxone.  Still with a lot of driveline site pain.   MAP 80s-90s.   LVAD INTERROGATION:  HeartMate II LVAD:   Flow 4.3 liters/min, speed 5600, power 4.0  PI 2.6  VAD interrogated personally. Parameters stable.  Objective:    Vital Signs:   Temp:  [97.3 F (36.3 C)-98 F (36.7 C)] 98 F (36.7 C) (10/20 0723) Pulse Rate:  [72-108] 108 (10/20 0723) Resp:  [16-18] 16 (10/20 0723) BP: (89-132)/(68-106) 90/78 (10/20 0748) SpO2:  [96 %-97 %] 96 % (10/20 0723) Weight:  [62.6 kg] 62.6 kg (10/20 0600) Last BM Date : 10/10/23 VAD Mean arterial Pressure  80s-90s.   Intake/Output:   Intake/Output Summary (Last 24 hours) at 10/12/2023 1008 Last data filed at 10/12/2023 0748 Gross per 24 hour  Intake 126.8 ml  Output --  Net 126.8 ml     Physical Exam   General: Well appearing this am. NAD.  HEENT: Normal. Neck: Supple, JVP 7-8 cm. Carotids OK.  Cardiac:  Mechanical heart sounds with LVAD hum present.  Lungs:  CTAB, normal effort.  Abdomen:  NT, ND, no HSM. No bruits or masses. +BS  LVAD exit site: Wound vac in place, site stable.  Extremities:  Warm and dry. No cyanosis, clubbing, rash, or edema.  Neuro:  Alert & oriented  x 3. Cranial nerves grossly intact. Moves all 4 extremities w/o difficulty. Affect pleasant    Telemetry   NSR 80-90s Personally reviewed  Labs   Basic Metabolic Panel: Recent Labs  Lab 10/08/23 0415 10/09/23 0445 10/10/23 0450 10/11/23 0454 10/12/23 0418  NA 137 136 135 137 139  K 3.8 3.7 3.7 3.8 4.0  CL 100 96* 96* 98 103  CO2 28 28 29 28 27   GLUCOSE 95 92 109* 84 123*  BUN 14 10 14 11 10   CREATININE 0.67 0.72 0.76 0.77 0.82  CALCIUM 9.2 9.3 9.0 9.5 9.5    Liver Function Tests: No results for input(s): "AST", "ALT", "ALKPHOS", "BILITOT", "PROT", "ALBUMIN" in the last 168 hours.  No results for input(s): "LIPASE", "AMYLASE" in the last 168 hours. No results for input(s): "AMMONIA" in the last 168 hours.  CBC: Recent Labs  Lab 10/06/23 0500 10/07/23 0540 10/08/23 0415 10/09/23 0445  WBC 6.1 8.3 7.9 7.8  HGB 11.8* 11.7* 11.4* 12.2*  HCT 36.5* 36.1* 35.5* 37.5*  MCV 88.8 86.2 86.2 86.6  PLT 283 296 269 282    INR: Recent Labs  Lab 10/08/23 0415 10/09/23 0445 10/10/23 0450 10/11/23 0454 10/12/23 0418  INR 1.2 1.4* 1.5* 1.5* 1.5*    Other results:    Imaging   No results found.   Medications:     Scheduled Medications:  acetaminophen  1,000 mg Oral Q6H  buprenorphine-naloxone  1 tablet Sublingual Daily   digoxin  0.125 mg Oral Daily   feeding supplement  237 mL Oral TID WC   ivabradine  5 mg Oral BID WC   losartan  25 mg Oral BID   mexiletine  200 mg Oral BID   pantoprazole  40 mg Oral Daily   polyethylene glycol  17 g Oral BID   senna-docusate  2 tablet Oral BID   sertraline  25 mg Oral Daily   sodium chloride flush  10-40 mL Intracatheter Q12H   traMADol  100 mg Oral Q6H   warfarin  2 mg Oral q1600   Warfarin - Pharmacist Dosing Inpatient   Does not apply q1600   zinc sulfate  220 mg Oral Daily    Infusions:   ceFAZolin (ANCEF) IV 2 g (10/12/23 0424)   heparin 500 Units/hr (10/12/23 0748)    PRN Medications: albuterol,  HYDROmorphone (DILAUDID) injection, mouth rinse, oxyCODONE, sodium chloride flush, sorbitol, traZODone  Assessment/Plan:    1. Acute Driveline Infection - Prior clinic visit 9/30 DL site was noted by VAD RN to be not completely incorporated. Long discussion about strategies to protect DL and keep it immobilized. Reported mild itching and redness at the time. - Now admitted with driveline infection.  - CT chest/abd/pelvis showed abscess  - Wound Cx + Staph Aureus - 10/9 debridement + wound vac>>Surgical wound cultures 10/9 with MSSA - BCx NGTD  - ID consulted: Dapto transitioned to IV cefazolin with MSSA - 10/11 wound vac changed - 10/15 I&D + VAC change - Remains on IV abx. No fever. Wound vac obstructed and beeping this morning, apparently very positional.  Nursing to adjust.  - Plan back to OR on 10/22 for debridement, vac change.  - Appreciate ID. Continue cefazolin X 6 weeks (end date 11/18/23). Home infusion rep contacted. Will need to exchange PICC for single lumen prior to discharge. - Pain regimen being managed by Dr. Donata Clay - Suboxone restarted 10/18. Still with significant site pain, getting prn oxycodone and Tylenol.   2. 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 ? 2/2 PVCs vs genetically mediated +/- hypertension.  - LHC: normal coronaries.  - cMRI:  LV markedly dilated EF 10% RV 17% NICM - Underwent HM-III VAD on 07/28/25. Much improved NYHA I-II  - INR stable at 1.5. today. Goal 2.0-3.0. Continue Warfarin + Heparin gtt. Discussed warfarin dosing with PharmD personally. - Volume ok. MAP 70-90s  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 Tobacco use - reports quit smoking 7/24 - Cotinine level at 30.1 09/22/23  5. Hx drug  abuse - Plan as above   I reviewed the LVAD parameters from today, and compared the results to the patient's prior recorded data.  No programming changes were made.  The LVAD is functioning within specified parameters.  The patient performs LVAD self-test daily.  LVAD interrogation was negative for any significant power changes, alarms or PI events/speed drops.  LVAD equipment check completed and is in good working order.  Back-up equipment present.   LVAD education done on emergency procedures and precautions and reviewed exit site care.  Length of Stay: 41  Marca Ancona, MD 10/12/2023, 10:08 AM  VAD Team --- VAD ISSUES  ONLY--- Pager [phone] (7am - 7am)  Advanced Heart Failure Team  Pager [phone] (M-F; 7a - 5p)  Please contact [facility] for night-coverage after hours (5p -7a ) and weekends on [email or web address]

## 2023-10-12 NOTE — Plan of Care (Signed)
  Problem: Education: Goal: Understanding of CV disease, CV risk reduction, and recovery process will improve Outcome: Progressing Goal: Individualized Educational Video(s) Outcome: Progressing   Problem: Activity: Goal: Ability to return to baseline activity level will improve Outcome: Progressing   Problem: Cardiovascular: Goal: Ability to achieve and maintain adequate cardiovascular perfusion will improve Outcome: Progressing Goal: Vascular access site(s) Level 0-1 will be maintained Outcome: Progressing   Problem: Health Behavior/Discharge Planning: Goal: Ability to safely manage health-related needs after discharge will improve Outcome: Progressing   Problem: Education: Goal: Patient will understand all VAD equipment and how it functions Outcome: Progressing Goal: Patient will be able to verbalize current INR target range and antiplatelet therapy for discharge home Outcome: Progressing   Problem: Cardiac: Goal: LVAD will function as expected and patient will experience no clinical alarms Outcome: Progressing   Problem: Education: Goal: Knowledge of General Education information will improve Description: Including pain rating scale, medication(s)/side effects and non-pharmacologic comfort measures Outcome: Progressing   Problem: Health Behavior/Discharge Planning: Goal: Ability to manage health-related needs will improve Outcome: Progressing   Problem: Clinical Measurements: Goal: Ability to maintain clinical measurements within normal limits will improve Outcome: Progressing Goal: Will remain free from infection Outcome: Progressing Goal: Diagnostic test results will improve Outcome: Progressing Goal: Respiratory complications will improve Outcome: Progressing Goal: Cardiovascular complication will be avoided Outcome: Progressing   Problem: Activity: Goal: Risk for activity intolerance will decrease Outcome: Progressing   Problem: Nutrition: Goal: Adequate  nutrition will be maintained Outcome: Progressing   Problem: Coping: Goal: Level of anxiety will decrease Outcome: Progressing   Problem: Elimination: Goal: Will not experience complications related to bowel motility Outcome: Progressing Goal: Will not experience complications related to urinary retention Outcome: Progressing   Problem: Pain Managment: Goal: General experience of comfort will improve Outcome: Progressing   Problem: Safety: Goal: Ability to remain free from injury will improve Outcome: Progressing   Problem: Skin Integrity: Goal: Risk for impaired skin integrity will decrease Outcome: Progressing

## 2023-10-12 NOTE — Progress Notes (Signed)
PHARMACY - ANTICOAGULATION CONSULT NOTE  Pharmacy Consult for Heparin infusion + warfarin  Indication:  LVAD  Allergies  Allergen Reactions   Other Anaphylaxis    Tree Nuts   Peanuts [Peanut Oil] Anaphylaxis   Chlorhexidine Rash    Patient Measurements: Height: 5\' 7"  (170.2 cm) Weight: 62.6 kg (138 lb 0.1 oz) IBW/kg (Calculated) : 66.1 Heparin Dosing Weight: 63.1 kg  Vital Signs: Temp: 98 F (36.7 C) (10/20 0723) Temp Source: Oral (10/20 0723) BP: 90/78 (10/20 0748) Pulse Rate: 108 (10/20 0723)  Labs: Recent Labs    10/10/23 0450 10/11/23 0454 10/12/23 0418  LABPROT 18.6* 18.2* 18.7*  INR 1.5* 1.5* 1.5*  HEPARINUNFRC <0.10* <0.10* <0.10*  CREATININE 0.76 0.77 0.82    Estimated Creatinine Clearance: 110.3 mL/min (by C-G formula based on SCr of 0.82 mg/dL).   Medical History: Past Medical History:  Diagnosis Date   Acid reflux    ADHD (attention deficit hyperactivity disorder)    Asthma    Back pain    Seizures (HCC)    Resolved    Medications:  Infusions:    ceFAZolin (ANCEF) IV 2 g (10/12/23 0424)   heparin 500 Units/hr (10/12/23 0981)    Assessment: 37 yo male with LVAD, admitted with driveline infection. On warfarin PTA - PTA regimen is 2mg  daily except 3 mg MF. INR on admit therapeutic at 2.  INR remains low at 1.5 on low dose warfarin  - with goal about 1.5 while in/out OR for I&D/washout/VAC changes   On heparin infusion at 500 units/hr (no titration) while INR < 1.8. Hgb 11 -12 stable, plt 200s stable LDH 100s stable.  Goal of Therapy: INR 2-2.5  Heparin level 0.2-0.3 units/ml Monitor platelets by anticoagulation protocol: Yes   Plan:  Continue heparin 500 units/hr  Warfarin 2 mg daily Monitor daily CBC, protime and heparin level, and for s/sx of bleeding  Wilmer Floor, PharmD PGY2 Cardiology Pharmacy Resident 10/12/2023 8:52 AM    Please check AMION for all [facility] Pharmacy phone numbers After 10:00 PM, call Main Pharmacy  [phone]

## 2023-10-12 NOTE — Progress Notes (Signed)
Received page from bedside RN reporting blockage alarm from wound vac. Clot material noted in track pad tubing.   VAD coordinator came to bedside. Under sterile technique replaced clotted off track pad, and removed previous VASHE instillation track pad from secondary sponge site and covered with clear VAC dressing. Turned vac back on- leak alarm occurred despite reinforced clear dressing. Discussed with Dr Donata Clay. Order received to turn off VAC for now and will address wound care tomorrow. Discussed plan with bedside RN who verbalized understanding.   Pt complaining of severe abdominal pain. Last received Oxy IR 5 mg at noon. Discussed with Dr Shirlee Latch. Will increase Oxy IR to 5-10 mg q 6 hr PRN. If pain unable to be managed, may increase to q 4 hr PRN. Order updated in EPIC. Bedside RN aware of plan, and verbalized understanding. Pt aware of the above.   Alyce Pagan RN VAD Coordinator  Office: [phone]  24/7 Pager: [phone]

## 2023-10-13 DIAGNOSIS — I509 Heart failure, unspecified: Secondary | ICD-10-CM | POA: Diagnosis not present

## 2023-10-13 DIAGNOSIS — T827XXA Infection and inflammatory reaction due to other cardiac and vascular devices, implants and grafts, initial encounter: Secondary | ICD-10-CM | POA: Diagnosis not present

## 2023-10-13 DIAGNOSIS — I4891 Unspecified atrial fibrillation: Secondary | ICD-10-CM

## 2023-10-13 DIAGNOSIS — Z95811 Presence of heart assist device: Secondary | ICD-10-CM | POA: Diagnosis not present

## 2023-10-13 LAB — CBC
HCT: 37.7 % — ABNORMAL LOW (ref 39.0–52.0)
Hemoglobin: 12 g/dL — ABNORMAL LOW (ref 13.0–17.0)
MCH: 28.1 pg (ref 26.0–34.0)
MCHC: 31.8 g/dL (ref 30.0–36.0)
MCV: 88.3 fL (ref 80.0–100.0)
Platelets: 253 10*3/uL (ref 150–400)
RBC: 4.27 MIL/uL (ref 4.22–5.81)
RDW: 14.5 % (ref 11.5–15.5)
WBC: 5.8 10*3/uL (ref 4.0–10.5)
nRBC: 0 % (ref 0.0–0.2)

## 2023-10-13 LAB — HEPARIN LEVEL (UNFRACTIONATED): Heparin Unfractionated: <0.1 [IU]/mL — ABNORMAL LOW (ref 0.30–0.70)

## 2023-10-13 LAB — BASIC METABOLIC PANEL
Anion gap: 9 (ref 5–15)
BUN: 8 mg/dL (ref 6–20)
CO2: 25 mmol/L (ref 22–32)
Calcium: 8.9 mg/dL (ref 8.9–10.3)
Chloride: 101 mmol/L (ref 98–111)
Creatinine, Ser: 1.06 mg/dL (ref 0.61–1.24)
GFR, Estimated: >60 mL/min (ref >60)
Glucose, Bld: 136 mg/dL — ABNORMAL HIGH (ref 70–99)
Potassium: 3.3 mmol/L — ABNORMAL LOW (ref 3.5–5.1)
Sodium: 135 mmol/L (ref 135–145)

## 2023-10-13 LAB — PROTIME-INR
INR: 1.6 — ABNORMAL HIGH (ref 0.8–1.2)
Prothrombin Time: 19.2 s — ABNORMAL HIGH (ref 11.4–15.2)

## 2023-10-13 LAB — MAGNESIUM: Magnesium: 1.8 mg/dL (ref 1.7–2.4)

## 2023-10-13 LAB — LACTATE DEHYDROGENASE: LDH: 156 U/L (ref 98–192)

## 2023-10-13 MED ORDER — HYDROMORPHONE HCL 1 MG/ML IJ SOLN
INTRAMUSCULAR | Status: AC
  Administered 2023-10-13: 3 mg
  Filled 2023-10-13: qty 4

## 2023-10-13 MED ORDER — HYDROMORPHONE HCL 1 MG/ML IJ SOLN
2.0000 mg | Freq: Once | INTRAMUSCULAR | Status: AC
  Administered 2023-10-13: 2 mg via INTRAVENOUS
  Filled 2023-10-13: qty 2

## 2023-10-13 MED ORDER — POTASSIUM CHLORIDE CRYS ER 20 MEQ PO TBCR
40.0000 meq | EXTENDED_RELEASE_TABLET | Freq: Once | ORAL | Status: AC
  Administered 2023-10-13: 40 meq via ORAL
  Filled 2023-10-13: qty 2

## 2023-10-13 MED ORDER — MAGNESIUM SULFATE 2 GM/50ML IV SOLN
2.0000 g | Freq: Once | INTRAVENOUS | Status: AC
  Administered 2023-10-13: 2 g via INTRAVENOUS
  Filled 2023-10-13: qty 50

## 2023-10-13 MED ORDER — WARFARIN SODIUM 3 MG PO TABS
3.0000 mg | ORAL_TABLET | Freq: Once | ORAL | Status: AC
  Administered 2023-10-13: 3 mg via ORAL
  Filled 2023-10-13: qty 1

## 2023-10-13 MED ORDER — HYDROMORPHONE HCL 2 MG/ML IJ SOLN: 4.0000 mg | Freq: Once | INTRAMUSCULAR | Status: DC

## 2023-10-13 MED ORDER — HYDROMORPHONE HCL 2 MG/ML IJ SOLN
4.0000 mg | Freq: Once | INTRAMUSCULAR | Status: DC
Start: 2023-10-13 — End: 2023-10-13

## 2023-10-13 MED ORDER — HYDROMORPHONE HCL PF 2 MG/ML IJ SOLN
4.0000 mg | Freq: Once | INTRAMUSCULAR | Status: AC
  Filled 2023-10-13: qty 2

## 2023-10-13 MED ORDER — HYDROMORPHONE HCL 1 MG/ML IJ SOLN
1.0000 mg | Freq: Every day | INTRAMUSCULAR | Status: DC | PRN
  Administered 2023-10-14: 1 mg via INTRAVENOUS
  Filled 2023-10-13: qty 1

## 2023-10-13 MED ORDER — KETOROLAC TROMETHAMINE 30 MG/ML IJ SOLN
30.0000 mg | Freq: Four times a day (QID) | INTRAMUSCULAR | Status: AC
  Administered 2023-10-13 – 2023-10-14 (×4): 30 mg via INTRAVENOUS
  Filled 2023-10-13 (×4): qty 1

## 2023-10-13 MED ORDER — SPIRONOLACTONE 12.5 MG HALF TABLET
12.5000 mg | ORAL_TABLET | Freq: Every day | ORAL | Status: DC
  Administered 2023-10-13 – 2023-10-15 (×3): 12.5 mg via ORAL
  Filled 2023-10-13 (×3): qty 1

## 2023-10-13 MED ORDER — OXYCODONE HCL 5 MG PO TABS
5.0000 mg | ORAL_TABLET | ORAL | Status: DC | PRN
  Administered 2023-10-13 (×4): 10 mg via ORAL
  Administered 2023-10-13: 5 mg via ORAL
  Administered 2023-10-13 – 2023-10-14 (×4): 10 mg via ORAL
  Filled 2023-10-13 (×9): qty 2

## 2023-10-13 MED ORDER — BUPRENORPHINE HCL-NALOXONE HCL 8-2 MG SL SUBL
1.0000 | SUBLINGUAL_TABLET | Freq: Every day | SUBLINGUAL | Status: DC
  Administered 2023-10-14 – 2023-10-16 (×3): 1 via SUBLINGUAL
  Filled 2023-10-13 (×3): qty 1

## 2023-10-13 NOTE — Progress Notes (Signed)
Patient ID: Joel York, male   DOB: 1986/01/05, 37 y.o.   MRN: [id number]   Advanced Heart Failure VAD Team Note  PCP-Cardiologist: None   Subjective:   09/29/23: admitted with DL infection. CT A/P with fluid collection around driveline suggestive of abscess. Wound Cx + Staph Aureus  09/30/23: developed significant bleeding from DL site>>given FFP 40/9/81: I/D with CTS with wound vac placement. Surgical wound cultures 10/9 with MSSA 10/03/23: VAC change 10/15: I/D with CTS + VAC change  Remains on IV cefazolin. ID recommending 6 weeks IV antibiotics.   Denies fevers or chills. Wound vac clotted off yesterday.   Remains on heparin/warfarin. INR stable at 1.6  Off Dilaudid, getting oxycodone prn and Tylenol as well as Suboxone.  Driveline pain slightly improved.   MAP 90s-100s (2/2 pain)   LVAD INTERROGATION:  HeartMate II LVAD:   Flow 4.1 liters/min, speed 5600, power 4.3  PI 3.9  Unable to fully interrogate VAD as he's off the wall monitor.   Objective:    Vital Signs:   Temp:  [97.7 F (36.5 C)-98.8 F (37.1 C)] 97.9 F (36.6 C) (10/21 0415) Pulse Rate:  [71-80] 71 (10/21 0415) Resp:  [16-18] 16 (10/21 0415) BP: (89-109)/(74-88) 96/78 (10/21 0415) SpO2:  [97 %-98 %] 97 % (10/21 0415) Weight:  [62.3 kg] 62.3 kg (10/21 0500) Last BM Date : 10/10/23 VAD Mean arterial Pressure  90s-100.   Intake/Output:   Intake/Output Summary (Last 24 hours) at 10/13/2023 0734 Last data filed at 10/13/2023 0615 Gross per 24 hour  Intake 960.26 ml  Output --  Net 960.26 ml     Physical Exam  General:  Well appearing. No resp difficulty HEENT: Normal Neck: supple. JVP ~6. Carotids 2+ bilat; no bruits. No lymphadenopathy or thyromegaly appreciated. Cor: Mechanical heart sounds with LVAD hum present. Lungs: Clear Abdomen: soft, nontender, nondistended. No hepatosplenomegaly. No bruits or masses. Good bowel sounds. Driveline: C/D/I; securement device intact and driveline  incorporated Extremities: no cyanosis, clubbing, rash, edema. PICC RUE Neuro: alert & orientedx3, cranial nerves grossly intact. moves all 4 extremities w/o difficulty. Affect pleasant   Telemetry   Refusing tele   Labs   Basic Metabolic Panel: Recent Labs  Lab 10/09/23 0445 10/10/23 0450 10/11/23 0454 10/12/23 0418 10/13/23 0415  NA 136 135 137 139 135  K 3.7 3.7 3.8 4.0 3.3*  CL 96* 96* 98 103 101  CO2 28 29 28 27 25   GLUCOSE 92 109* 84 123* 136*  BUN 10 14 11 10 8   CREATININE 0.72 0.76 0.77 0.82 1.06  CALCIUM 9.3 9.0 9.5 9.5 8.9    Liver Function Tests: No results for input(s): "AST", "ALT", "ALKPHOS", "BILITOT", "PROT", "ALBUMIN" in the last 168 hours.  No results for input(s): "LIPASE", "AMYLASE" in the last 168 hours. No results for input(s): "AMMONIA" in the last 168 hours.  CBC: Recent Labs  Lab 10/07/23 0540 10/08/23 0415 10/09/23 0445 10/13/23 0415  WBC 8.3 7.9 7.8 5.8  HGB 11.7* 11.4* 12.2* 12.0*  HCT 36.1* 35.5* 37.5* 37.7*  MCV 86.2 86.2 86.6 88.3  PLT 296 269 282 253    INR: Recent Labs  Lab 10/09/23 0445 10/10/23 0450 10/11/23 0454 10/12/23 0418 10/13/23 0415  INR 1.4* 1.5* 1.5* 1.5* 1.6*    Other results:    Imaging   No results found.   Medications:     Scheduled Medications:  acetaminophen  1,000 mg Oral Q6H   buprenorphine-naloxone  1 tablet Sublingual Daily   digoxin  0.125 mg Oral Daily   feeding supplement  237 mL Oral TID WC   ivabradine  5 mg Oral BID WC   losartan  25 mg Oral BID   mexiletine  200 mg Oral BID   pantoprazole  40 mg Oral Daily   polyethylene glycol  17 g Oral BID   senna-docusate  2 tablet Oral BID   sertraline  25 mg Oral Daily   sodium chloride flush  10-40 mL Intracatheter Q12H   traMADol  100 mg Oral Q6H   warfarin  2 mg Oral q1600   Warfarin - Pharmacist Dosing Inpatient   Does not apply q1600   zinc sulfate  220 mg Oral Daily    Infusions:   ceFAZolin (ANCEF) IV Stopped (10/13/23  0436)   heparin 500 Units/hr (10/13/23 0615)    PRN Medications: albuterol, mouth rinse, oxyCODONE, sodium chloride flush, sorbitol, traZODone  Assessment/Plan:   1. Acute Driveline Infection - Prior clinic visit 9/30 DL site was noted by VAD RN to be not completely incorporated. Long discussion about strategies to protect DL and keep it immobilized. Reported mild itching and redness at the time. - Now admitted with driveline infection.  - CT chest/abd/pelvis showed abscess  - Wound Cx + Staph Aureus - 10/9 debridement + wound vac>>Surgical wound cultures 10/9 with MSSA - BCx NGTD  - ID consulted: Dapto transitioned to IV cefazolin with MSSA - 10/11 wound vac changed - 10/15 I&D + VAC change - Remains on IV abx. No fever. Wound vac clotted off, plan to reassess later this morning.  - Plan back to OR on 10/22 for debridement, vac change.  - Appreciate ID. Continue cefazolin X 6 weeks (end date 11/18/23). Home infusion rep contacted. Will need to exchange PICC for single lumen prior to discharge. - Suboxone restarted 10/18. Still with significant site pain, getting prn oxycodone and Tylenol.   2. 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 ? 2/2 PVCs vs genetically mediated +/- hypertension.  - LHC: normal coronaries.  - cMRI:  LV markedly dilated EF 10% RV 17% NICM - Underwent HM-III VAD on 07/28/25. Much improved NYHA I-II  - INR stable at 1.6. today. Goal 2.0-3.0. Continue Warfarin + Heparin gtt. Discussed warfarin dosing with PharmD personally. - Volume stable. MAP slightly elevated, suspect 2/2 pain. 90s-100s  - Continue digoxin 0.125 mg daily - Continue losartan 25 mg BID - Restart spiro 12.5 mg daily, previously held 7/24 with soft BP. BP stable, will retrial.  - Continue Ivabradine 5 BID  3. PVCs - Suppressed with Mexiletine 200 mg BID - likely can stop soon  4.  Tobacco use - reports quit smoking 7/24 - Cotinine level at 30.1 09/22/23  5. Hx drug abuse - Plan as above   I reviewed the LVAD parameters from today, and compared the results to the patient's prior recorded data.  No programming changes were made.  The LVAD is functioning within specified parameters.  The patient performs LVAD self-test daily.  LVAD interrogation was negative for any significant power changes, alarms or PI events/speed drops.  LVAD equipment check completed and is in good working order.  Back-up equipment present.   LVAD education done on emergency procedures and precautions and reviewed exit site care.  Length of Stay: 14  Alen Bleacher, NP 10/13/2023, 7:34 AM  VAD Team --- VAD ISSUES ONLY--- Pager [phone] (7am -  7am)  Advanced Heart Failure Team  Pager [phone] (M-F; 7a - 5p)  Please contact [facility] for night-coverage after hours (5p -7a ) and weekends on [email or web address]

## 2023-10-13 NOTE — TOC Progression Note (Signed)
Transition of Care [facility]) - Progression Note    Patient Details  Name: Joel York MRN: [id number] Date of Birth: 11-03-1986  [facility]) CM/SW Contact  Elliot Cousin, RN Phone Number: [phone] 10/13/2023, 3:51 PM  Clinical Narrative:   CM spoke to pt and states he is already set with clinic for Suboxone. Pt will need Home IV abx. Offered choice to patient and agreeable to [facility]. Contacted [employer] rep, Pam with referral. Possible dc home Friday.     Expected Discharge Plan: Home w Home Health Services Barriers to Discharge: Continued Medical Work up  Expected Discharge Plan and Services   Discharge Planning Services: CM Consult Post Acute Care Choice: Home Health    HH Arranged: RN [facility] Agency: [employer] Date HH Agency Contacted: 10/13/23 Time HH Agency Contacted: 1551 Representative spoke with at [facility] Agency: Pam RN, [facility]   Social Determinants of Health (SDOH) Interventions SDOH Screenings   Food Insecurity: No Food Insecurity (09/29/2023)  Housing: Low Risk  (09/29/2023)  Transportation Needs: No Transportation Needs (09/29/2023)  Utilities: Not At Risk (09/29/2023)  Alcohol Screen: Low Risk  (07/14/2023)  Financial Resource Strain: Medium Risk (08/12/2023)  Physical Activity: Sufficiently Active (07/14/2023)  Stress: Stress Concern Present (07/14/2023)  Tobacco Use: High Risk (10/07/2023)    Readmission Risk Interventions     No data to display

## 2023-10-13 NOTE — TOC Progression Note (Signed)
Transition of Care [facility]) - Progression Note    Patient Details  Name: Joel York MRN: [id number] Date of Birth: Nov 15, 1986  Transition of Care [facility]) CM/SW Contact  Nicanor Bake Phone Number: [phone] 10/13/2023, 2:24 PM  Clinical Narrative:  HF CSW met with pt at bedside. Pt appeared to be in a irritable mood. CSW attempted to speak with pt. Pt was short worded and stated that he did not feel that good. CSW stated that she will follow up with pt at a later time.   TOC will continue following.     Expected Discharge Plan: Home w Home Health Services Barriers to Discharge: Continued Medical Work up  Expected Discharge Plan and Services   Discharge Planning Services: CM Consult Post Acute Care Choice: Home Health                                         Social Determinants of Health (SDOH) Interventions SDOH Screenings   Food Insecurity: No Food Insecurity (09/29/2023)  Housing: Low Risk  (09/29/2023)  Transportation Needs: No Transportation Needs (09/29/2023)  Utilities: Not At Risk (09/29/2023)  Alcohol Screen: Low Risk  (07/14/2023)  Financial Resource Strain: Medium Risk (08/12/2023)  Physical Activity: Sufficiently Active (07/14/2023)  Stress: Stress Concern Present (07/14/2023)  Tobacco Use: High Risk (10/07/2023)    Readmission Risk Interventions     No data to display

## 2023-10-13 NOTE — Progress Notes (Signed)
6 Days Post-Op Procedure(s) (LRB): VAD TUNNEL DEBRIDEMENT USING MYRIAD MORCELLS FINE (N/A) WOUND VAC CHANGE (N/A) Subjective: Having pain from abdominal wound Wound VAC system non- functional and the sponge abd Sorbact mesh were removed at the bedside with sterile technique and premed 2 mg iv dilaudid, another 1 mg during procedure  The wound is clean and no tunneling around power cord in the proximal end of the wound 4x4 gauze with Vashe placed in lower wound, 2x2 gauze with vash in the upper wound.  Wound will need to establish a good base and contract and shallow out before it is safe to transition to outpatient care. Daily wet/dry dressings now- OR cancelled for tomorrow.  Objective: Vital signs in last 24 hours: Temp:  [97.7 F (36.5 C)-98.8 F (37.1 C)] 97.7 F (36.5 C) (10/21 0805) Pulse Rate:  [71-88] 88 (10/21 0805) Cardiac Rhythm: Normal sinus rhythm (10/20 2000) Resp:  [16-18] 18 (10/21 0805) BP: (89-109)/(74-88) 95/80 (10/21 0805) SpO2:  [95 %-98 %] 95 % (10/21 0805) Weight:  [62.3 kg] 62.3 kg (10/21 0500)  Hemodynamic parameters for last 24 hours:    Intake/Output from previous day: 10/20 0701 - 10/21 0700 In: 960.3 [I.V.:131.2; IV Piggyback:829] Out: -  Intake/Output this shift: Total I/O In: -  Out: 1200 [Urine:1200]      Exam    General- alert and comfortable, Wound clean, 95 % clean granulation tissue    Neck- no JVD, no cervical adenopathy palpable, no carotid bruit   Lungs- clear without rales, wheezes   Cor- regular rate and rhythm, normal VAD hum   Abdomen- soft, non-tender   Extremities - warm, non-tender, minimal edema   Neuro- oriented, appropriate, no focal weakness   Lab Results: Recent Labs    10/13/23 0415  WBC 5.8  HGB 12.0*  HCT 37.7*  PLT 253   BMET:  Recent Labs    10/12/23 0418 10/13/23 0415  NA 139 135  K 4.0 3.3*  CL 103 101  CO2 27 25  GLUCOSE 123* 136*  BUN 10 8  CREATININE 0.82 1.06  CALCIUM 9.5 8.9     PT/INR:  Recent Labs    10/13/23 0415  LABPROT 19.2*  INR 1.6*   ABG    Component Value Date/Time   PHART 7.510 (H) 08/03/2023 0445   HCO3 27.3 08/03/2023 0445   TCO2 28 08/03/2023 0445   ACIDBASEDEF 2.0 07/31/2023 1704   O2SAT 68.1 08/08/2023 0535   CBG (last 3)  No results for input(s): "GLUCAP" in the last 72 hours.  Assessment/Plan: S/P Procedure(s) (LRB): VAD TUNNEL DEBRIDEMENT USING MYRIAD MORCELLS FINE (N/A) WOUND VAC CHANGE (N/A) Cont wound care, IV antibiotics for VAD tunnel wound Cont with coumadin for goal 2.0   LOS: 14 days    Lovett Sox 10/13/2023

## 2023-10-13 NOTE — Progress Notes (Signed)
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  Pt lying in bed on my arrival. Continues to have pain at wound site. Taking PRN Dilaudid.   VAD coordinator came in yesterday to trouble shoot the wound vac as it was alarming blockage. VAD coordinator unable to get a seal on vac after changing bell. Per Dr Zenaida Niece Trigt left wound vac dressing on wound.  Dr Donata Clay and VAD coordinator removed sponge and repacked wound at bedside this morning. See below for full documentation.    Vital signs: Temp: 97.7 HR: 88 Doppler Pressure: not documented Auto BP: 95/80 (87) O2 Sat: 95% on RA Wt: 143.3>139.1>139.5>141.3>140.4>139.7>137.7>137.3 lbs    LVAD interrogation reveals:  Speed: 5600 Flow: 4.2 Power: 4.3 w PI: 3.7  Alarms: none Events: 30 yest; 30 today Hematocrit: 37  Fixed speed: 5600 Low speed limit: 5300  Drive Line: pt premedicated with Dilaudid per bedside nurse. Dressing, sponge and Sorbact removed. There is wound product visible. Lightly debride by Dr Maren Beach. Cleaned with betadine x 2. Vashe moistened 4 x 4 packed in large wound. 2 Vashe moistened 2 x 2 packed in top portion of wound. Covered with 2 large tegaderms. Anchor secure. Continue daily dressing changes by VAD coordinator or bedside nurse. Next dressing change due 10/14/23.    Labs:  LDH trend: 129>142>136>139>155>194>210>181>171>156  INR trend: 2.2>1.4>1.1>1.1>1.1>1.2>1.4>1.5>1.6  WBC trend: 8.6>7.7>7.3>5.7>6.1>8.3>7.9>7.8>5.8  Anticoagulation Plan: -INR Goal: 2-2.5 -ASA Dose: off  Blood Products:  09/30/23: 2 FFP  Infection:  09/29/23>>blood cxs x 2>> no growth x 1 days 09/29/23>>driveline cx>>MODERATE STAPHYLOCOCCUS AUREUS  10/01/23>>driveline cx OR>FEW STAPHYLOCOCCUS AUREUS; final 10/01/23>> AFB cx OR> negative; final 10/01/23> Fungus cx OR> negative; final   Adverse Events on  VAD: -  Plan/Recommendations:  1. Please page VAD coordinator for any alarms or VAD equipment issues. 2. Daily dressing changes using Vashe wet/dry by VAD coordinator or bedside nurse.  Carlton Adam RN VAD Coordinator  Office: [phone]  24/7 Pager: [phone]

## 2023-10-13 NOTE — Progress Notes (Signed)
PHARMACY - ANTICOAGULATION CONSULT NOTE  Pharmacy Consult for Heparin infusion + warfarin  Indication:  LVAD  Allergies  Allergen Reactions   Other Anaphylaxis    Tree Nuts   Peanuts [Peanut Oil] Anaphylaxis   Chlorhexidine Rash    Patient Measurements: Height: 5\' 7"  (170.2 cm) Weight: 62.3 kg (137 lb 5.6 oz) IBW/kg (Calculated) : 66.1 Heparin Dosing Weight: 63.1 kg  Vital Signs: Temp: 97.7 F (36.5 C) (10/21 0805) Temp Source: Oral (10/21 0805) BP: 95/80 (10/21 0805) Pulse Rate: 88 (10/21 0805)  Labs: Recent Labs    10/11/23 0454 10/12/23 0418 10/13/23 0415  HGB  --   --  12.0*  HCT  --   --  37.7*  PLT  --   --  253  LABPROT 18.2* 18.7* 19.2*  INR 1.5* 1.5* 1.6*  HEPARINUNFRC <0.10* <0.10* <0.10*  CREATININE 0.77 0.82 1.06    Estimated Creatinine Clearance: 84.9 mL/min (by C-G formula based on SCr of 1.06 mg/dL).   Medical History: Past Medical History:  Diagnosis Date   Acid reflux    ADHD (attention deficit hyperactivity disorder)    Asthma    Back pain    Seizures (HCC)    Resolved    Medications:  Infusions:    ceFAZolin (ANCEF) IV Stopped (10/13/23 0436)   heparin 500 Units/hr (10/13/23 0615)    Assessment: 37 yo male with LVAD, admitted with driveline infection. On warfarin PTA - PTA regimen is 2mg  daily except 3 mg MF. INR on admit therapeutic at 2.  INR is 1.6 - was keeping INR around 1.5 while in/out of OR for I&D/washout/VAC changes but no longer planning to return to OR so will target regular goal range. Hgb 12, plt 253, LDH 156. Heparin level came back undetectable as expected on heparin infusion at 500 units/hr - will continue without titration while INR<1.8.   Goal of Therapy: INR 2-2.5  Heparin level 0.2-0.3 units/ml Monitor platelets by anticoagulation protocol: Yes   Plan:  Continue heparin infusion at 500 units/hr  Warfarin 3 mg tonight Monitor daily CBC, protime and heparin level, and for s/sx of bleeding  Thank you for  allowing pharmacy to participate in this patient's care,  Sherron Monday, PharmD, BCCCP Clinical Pharmacist  Phone: [phone] 10/13/2023 10:55 AM  Please check AMION for all [facility] phone numbers After 10:00 PM, call Main Pharmacy [phone]

## 2023-10-13 NOTE — Plan of Care (Signed)
  Problem: Education: Goal: Understanding of CV disease, CV risk reduction, and recovery process will improve Outcome: Progressing Goal: Individualized Educational Video(s) Outcome: Progressing   Problem: Activity: Goal: Ability to return to baseline activity level will improve Outcome: Progressing   Problem: Cardiovascular: Goal: Ability to achieve and maintain adequate cardiovascular perfusion will improve Outcome: Progressing Goal: Vascular access site(s) Level 0-1 will be maintained Outcome: Progressing   Problem: Health Behavior/Discharge Planning: Goal: Ability to safely manage health-related needs after discharge will improve Outcome: Progressing   Problem: Education: Goal: Patient will understand all VAD equipment and how it functions Outcome: Progressing Goal: Patient will be able to verbalize current INR target range and antiplatelet therapy for discharge home Outcome: Progressing   Problem: Cardiac: Goal: LVAD will function as expected and patient will experience no clinical alarms Outcome: Progressing   Problem: Health Behavior/Discharge Planning: Goal: Ability to manage health-related needs will improve Outcome: Progressing   Problem: Clinical Measurements: Goal: Ability to maintain clinical measurements within normal limits will improve Outcome: Progressing Goal: Will remain free from infection Outcome: Progressing Goal: Diagnostic test results will improve Outcome: Progressing Goal: Respiratory complications will improve Outcome: Progressing Goal: Cardiovascular complication will be avoided Outcome: Progressing   Problem: Activity: Goal: Risk for activity intolerance will decrease Outcome: Progressing   Problem: Nutrition: Goal: Adequate nutrition will be maintained Outcome: Progressing   Problem: Coping: Goal: Level of anxiety will decrease Outcome: Progressing   Problem: Elimination: Goal: Will not experience complications related to bowel  motility Outcome: Progressing Goal: Will not experience complications related to urinary retention Outcome: Progressing   Problem: Pain Managment: Goal: General experience of comfort will improve Outcome: Progressing   Problem: Safety: Goal: Ability to remain free from injury will improve Outcome: Progressing   Problem: Skin Integrity: Goal: Risk for impaired skin integrity will decrease Outcome: Progressing

## 2023-10-14 ENCOUNTER — Other Ambulatory Visit: Payer: Self-pay

## 2023-10-14 ENCOUNTER — Encounter (HOSPITAL_COMMUNITY)
Admission: AD | Disposition: A | Discharge status: Home Health | Payer: Self-pay | Source: Ambulatory Visit | Attending: Cardiology

## 2023-10-14 DIAGNOSIS — Z95811 Presence of heart assist device: Secondary | ICD-10-CM | POA: Diagnosis not present

## 2023-10-14 DIAGNOSIS — I509 Heart failure, unspecified: Secondary | ICD-10-CM | POA: Diagnosis not present

## 2023-10-14 DIAGNOSIS — T827XXA Infection and inflammatory reaction due to other cardiac and vascular devices, implants and grafts, initial encounter: Secondary | ICD-10-CM | POA: Diagnosis not present

## 2023-10-14 DIAGNOSIS — I4891 Unspecified atrial fibrillation: Secondary | ICD-10-CM | POA: Diagnosis not present

## 2023-10-14 LAB — BASIC METABOLIC PANEL
Anion gap: 10 (ref 5–15)
BUN: 11 mg/dL (ref 6–20)
CO2: 26 mmol/L (ref 22–32)
Calcium: 9 mg/dL (ref 8.9–10.3)
Chloride: 102 mmol/L (ref 98–111)
Creatinine, Ser: 0.82 mg/dL (ref 0.61–1.24)
GFR, Estimated: >60 mL/min (ref >60)
Glucose, Bld: 130 mg/dL — ABNORMAL HIGH (ref 70–99)
Potassium: 3.9 mmol/L (ref 3.5–5.1)
Sodium: 138 mmol/L (ref 135–145)

## 2023-10-14 LAB — LACTATE DEHYDROGENASE: LDH: 174 U/L (ref 98–192)

## 2023-10-14 LAB — CBC
HCT: 38.7 % — ABNORMAL LOW (ref 39.0–52.0)
Hemoglobin: 12.5 g/dL — ABNORMAL LOW (ref 13.0–17.0)
MCH: 28.4 pg (ref 26.0–34.0)
MCHC: 32.3 g/dL (ref 30.0–36.0)
MCV: 88 fL (ref 80.0–100.0)
Platelets: 268 10*3/uL (ref 150–400)
RBC: 4.4 MIL/uL (ref 4.22–5.81)
RDW: 14.4 % (ref 11.5–15.5)
WBC: 6.3 10*3/uL (ref 4.0–10.5)
nRBC: 0 % (ref 0.0–0.2)

## 2023-10-14 LAB — PROTIME-INR
INR: 1.8 — ABNORMAL HIGH (ref 0.8–1.2)
Prothrombin Time: 20.9 s — ABNORMAL HIGH (ref 11.4–15.2)

## 2023-10-14 LAB — HEPARIN LEVEL (UNFRACTIONATED): Heparin Unfractionated: <0.1 [IU]/mL — ABNORMAL LOW (ref 0.30–0.70)

## 2023-10-14 SURGERY — APPLICATION, WOUND VAC
Anesthesia: General

## 2023-10-14 MED ORDER — KETOROLAC TROMETHAMINE 10 MG PO TABS: 10.0000 mg | ORAL_TABLET | Freq: Four times a day (QID) | ORAL | Status: DC | PRN

## 2023-10-14 MED ORDER — WARFARIN SODIUM 2 MG PO TABS
2.0000 mg | ORAL_TABLET | ORAL | Status: DC
  Administered 2023-10-14 – 2023-10-15 (×2): 2 mg via ORAL
  Filled 2023-10-14 (×2): qty 1

## 2023-10-14 MED ORDER — OXYCODONE HCL 5 MG PO TABS
5.0000 mg | ORAL_TABLET | Freq: Four times a day (QID) | ORAL | Status: DC | PRN
  Administered 2023-10-14 – 2023-10-15 (×3): 5 mg via ORAL
  Filled 2023-10-14 (×3): qty 1

## 2023-10-14 MED ORDER — WARFARIN SODIUM 3 MG PO TABS: 3.0000 mg | ORAL_TABLET | ORAL | Status: DC

## 2023-10-14 NOTE — Progress Notes (Signed)
Patient ID: Joel York, male   DOB: Jan 27, 1986, 37 y.o.   MRN: [id number]   Advanced Heart Failure VAD Team Note  PCP-Cardiologist: None   Subjective:   09/29/23: admitted with DL infection. CT A/P with fluid collection around driveline suggestive of abscess. Wound Cx + Staph Aureus  09/30/23: developed significant bleeding from DL site>>given FFP 44/0/34: I/D with CTS with wound vac placement. Surgical wound cultures 10/9 with MSSA 10/03/23: VAC change 10/15: I/D with CTS + VAC change  Remains on IV cefazolin. ID recommending 6 weeks IV antibiotics.   Wound vac removed yesterday, plan for wet to dry dressing changes.   Remains on heparin/warfarin. INR stable at 1.8  Off Dilaudid, getting oxycodone prn and Tylenol as well as Suboxone.  Still with pain at DL site  LVAD INTERROGATION:  HeartMate II LVAD:   Flow 3.9 liters/min, speed 5600, power 4.2  PI 5.3  x38 PI events this morning  Objective:    Vital Signs:   Temp:  [97.6 F (36.4 C)-97.9 F (36.6 C)] 97.8 F (36.6 C) (10/22 0734) Pulse Rate:  [70-88] 70 (10/22 0645) Resp:  [14-18] 18 (10/22 0734) BP: (80-106)/(65-83) 85/65 (10/22 0734) SpO2:  [97 %-98 %] 98 % (10/22 0734) Last BM Date : 10/10/23 VAD Mean arterial Pressure 80s-90s  Intake/Output:   Intake/Output Summary (Last 24 hours) at 10/14/2023 0822 Last data filed at 10/14/2023 7425 Gross per 24 hour  Intake 780.73 ml  Output 1200 ml  Net -419.27 ml     Physical Exam  General:  well appearing.  No respiratory difficulty HEENT: normal Neck: supple. JVD ~6 cm. Carotids 2+ bilat; no bruits. No lymphadenopathy or thyromegaly appreciated. Cor: PMI nondisplaced. Regular rate & rhythm. No rubs, gallops or murmurs. Lungs: clear Abdomen: soft, nontender, nondistended. No hepatosplenomegaly. No bruits or masses. Good bowel sounds. Extremities: no cyanosis, clubbing, rash, edema. RUE PICC Neuro: alert & oriented x 3, cranial nerves grossly intact. moves all 4  extremities w/o difficulty. Affect pleasant.   Telemetry   Refusing tele   Labs   Basic Metabolic Panel: Recent Labs  Lab 10/10/23 0450 10/11/23 0454 10/12/23 0418 10/13/23 0415 10/13/23 0500 10/14/23 0510  NA 135 137 139 135  --  138  K 3.7 3.8 4.0 3.3*  --  3.9  CL 96* 98 103 101  --  102  CO2 29 28 27 25   --  26  GLUCOSE 109* 84 123* 136*  --  130*  BUN 14 11 10 8   --  11  CREATININE 0.76 0.77 0.82 1.06  --  0.82  CALCIUM 9.0 9.5 9.5 8.9  --  9.0  MG  --   --   --   --  1.8  --     Liver Function Tests: No results for input(s): "AST", "ALT", "ALKPHOS", "BILITOT", "PROT", "ALBUMIN" in the last 168 hours.  No results for input(s): "LIPASE", "AMYLASE" in the last 168 hours. No results for input(s): "AMMONIA" in the last 168 hours.  CBC: Recent Labs  Lab 10/08/23 0415 10/09/23 0445 10/13/23 0415 10/14/23 0510  WBC 7.9 7.8 5.8 6.3  HGB 11.4* 12.2* 12.0* 12.5*  HCT 35.5* 37.5* 37.7* 38.7*  MCV 86.2 86.6 88.3 88.0  PLT 269 282 253 268    INR: Recent Labs  Lab 10/10/23 0450 10/11/23 0454 10/12/23 0418 10/13/23 0415 10/14/23 0510  INR 1.5* 1.5* 1.5* 1.6* 1.8*    Other results:    Imaging   No results found.   Medications:  Scheduled Medications:  acetaminophen  1,000 mg Oral Q6H   buprenorphine-naloxone  1 tablet Sublingual Daily   digoxin  0.125 mg Oral Daily   feeding supplement  237 mL Oral TID WC   ivabradine  5 mg Oral BID WC   ketorolac  30 mg Intravenous Q6H   losartan  25 mg Oral BID   pantoprazole  40 mg Oral Daily   polyethylene glycol  17 g Oral BID   senna-docusate  2 tablet Oral BID   sertraline  25 mg Oral Daily   sodium chloride flush  10-40 mL Intracatheter Q12H   spironolactone  12.5 mg Oral Daily   Warfarin - Pharmacist Dosing Inpatient   Does not apply q1600   zinc sulfate  220 mg Oral Daily    Infusions:   ceFAZolin (ANCEF) IV Stopped (10/14/23 0546)   heparin 500 Units/hr (10/14/23 0628)    PRN  Medications: albuterol, HYDROmorphone, mouth rinse, oxyCODONE, sodium chloride flush, sorbitol, traZODone  Assessment/Plan:   1. Acute Driveline Infection - Prior clinic visit 9/30 DL site was noted by VAD RN to be not completely incorporated. Long discussion about strategies to protect DL and keep it immobilized. Reported mild itching and redness at the time. - Now admitted with driveline infection. CT chest/abd/pelvis showed abscess. Wound Cx + Staph Aureus - 10/9 debridement + wound vac>>Surgical wound cultures 10/9 with MSSA - BCx NGTD  - ID consulted: Dapto transitioned to IV cefazolin with MSSA - 10/11 wound vac changed - 10/15 I&D + VAC change - Remains on IV abx. No fever.  - Now plan for wet to dry dressing changes, no plans to return to OR - Appreciate ID. Continue cefazolin X 6 weeks (end date 11/18/23). Home infusion rep contacted. Will need to exchange PICC for single lumen prior to discharge. - Suboxone restarted 10/18. Still with significant site pain, getting prn oxycodone and Tylenol.   2. 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 ? 2/2 PVCs vs genetically mediated +/- hypertension.  - LHC: normal coronaries.  - cMRI:  LV markedly dilated EF 10% RV 17% NICM - Underwent HM-III VAD on 07/28/25. Much improved NYHA I-II  - INR stable at 1.8. today. Goal 2.0-3.0. Continue Warfarin + Heparin gtt. Discussed warfarin dosing with PharmD personally. - Volume stable. MAP stable 80s-90s - Continue digoxin 0.125 mg daily - Continue losartan 25 mg BID - Continue spiro 12.5 mg daily - Continue Ivabradine 5 BID  3. PVCs - Suppressed with Mexiletine 200 mg BID, now stopped 10/21  4. Tobacco use - reports quit smoking 7/24 - Cotinine level at 30.1 09/22/23  5. Hx drug abuse - Plan as above  Plan for discharge in the next upcoming days. Will switch PICC for single  lumen PICC.   I reviewed the LVAD parameters from today, and compared the results to the patient's prior recorded data.  No programming changes were made.  The LVAD is functioning within specified parameters.  The patient performs LVAD self-test daily.  LVAD interrogation was negative for any significant power changes, alarms or PI events/speed drops.  LVAD equipment check completed and is in good working order.  Back-up equipment present.   LVAD education done on emergency procedures and precautions and reviewed exit site care.  Length of Stay: 15  Alen Bleacher, NP 10/14/2023, 8:22 AM  VAD Team --- VAD ISSUES ONLY--- Pager [phone] (7am -  7am)  Advanced Heart Failure Team  Pager [phone] (M-F; 7a - 5p)  Please contact [facility] for night-coverage after hours (5p -7a ) and weekends on [email or web address]

## 2023-10-14 NOTE — Progress Notes (Signed)
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  Pt lying in bed on my arrival. Continues to have pain at wound site but states it has improved with scheduled Toradol. Pt endorses a good appetite. Will continue daily wet to dry dressing changes and begin education with wife Penni Bombard) tomorrow.  Vital signs: Temp: 97.8 HR: 70 Doppler Pressure: 86 Auto BP: 85/65 (72) O2 Sat: 98% on RA Wt: 143.3>139.1>139.5>141.3>140.4>139.7>137.7>137.3>139.6 lbs    LVAD interrogation reveals:  Speed: 5600 Flow: 3.9 Power: 4.0 w PI: 4.5  Alarms: none Events: 30 PI events Hematocrit: 37  Fixed speed: 5600 Low speed limit: 5300  Drive Line: Pt premedicated with 1 mg of Dilaudid prior to dressing change. Existing VAD dressing removed and site care performed using sterile technique. Surrounding edge of wound bed cleansed with betadine x 3. Wound product visible. Vashe moistened 4 x 4 packed in large wound. 2 Vashe moistened 2 x 2 packed in top portion of wound. Covered with 2 large tegaderms. Anchor applied. Continue daily dressing changes by VAD coordinator or bedside nurse. Next dressing change due 10/15/23.    Labs:  LDH trend: 129>142>136>139>155>194>210>181>171>156>174  INR trend: 2.2>1.4>1.1>1.1>1.1>1.2>1.4>1.5>1.6>1.8  WBC trend: 8.6>7.7>7.3>5.7>6.1>8.3>7.9>7.8>5.8>6.3  Anticoagulation Plan: -INR Goal: 2-2.5 -ASA Dose: off  Blood Products:  09/30/23: 2 FFP  Infection:  09/29/23>>blood cxs x 2>> no growth x 1 days 09/29/23>>driveline cx>>MODERATE STAPHYLOCOCCUS AUREUS  10/01/23>>driveline cx OR>FEW STAPHYLOCOCCUS AUREUS; final 10/01/23>> AFB cx OR> negative; final 10/01/23> Fungus cx OR> negative; final  Adverse Events on VAD:  Plan/Recommendations:  1. Please page VAD coordinator for any alarms or VAD equipment issues. 2. Daily dressing changes using Vashe  wet/dry by VAD coordinator or bedside nurse.  Simmie Davies RN VAD Coordinator  Office: [phone]  24/7 Pager: [phone]

## 2023-10-14 NOTE — Discharge Summary (Incomplete)
Advanced Heart Failure Team  Discharge Summary   Patient ID: Joel York MRN: [id number], DOB/AGE: 1986/03/08 37 y.o. 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 male with HTN, ADD, asthma, hx drug abuse (now on suboxone x5 yrs), and seizures. Admitted in 7/27 with acute systolic heart failure -> shock. EF 10%. 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    Joel York was recently seen in LVAD clinic 09/22/23 with itching and redness at DL site and no other symptoms. On exam DL site was clean at that time with no drainage. Unfortunately presented back 09/29/23 with increased redness and new drainage around DL site, concern for DL infection. Admitted for further workup. CTA showed: fluid collection around driveline suggestive of abscess. Wound Cx + Staph Aureus. 10/8 he developed significant bleeding around DL site and was given FFP, resolved. Underwent I&D/wound vac change 10/9, 10/11 and 10/15 with wound vac placement. Wound vac clotted off multiple times. Now stable with wet to dry dressings at discharge. ID saw during admission, plan is for 6 weeks of IV cefazolin, end date 11/18/23. Will follow up with ID. Discharged home with single lumen PICC.   Pam chandler to provide abx teaching prior to discharge. VAD coordinator to go over wound dressing changes again with wife prior to discharge.   Pt will continue to be followed closely in the VAD/HF clinic. Dr Elwyn Lade evaluated and deemed appropriate for discharge.   See below for detailed problem list: 1. Acute Driveline Infection - Prior clinic visit 9/30 DL site was noted by VAD RN to be not completely incorporated. Long discussion about strategies to protect DL and keep it immobilized. Reported mild itching and redness  at the time. - Now admitted with driveline infection. CT chest/abd/pelvis showed abscess. Wound Cx + Staph Aureus - 10/9 debridement + wound vac>>Surgical wound cultures 10/9 with MSSA - BCx NGTD  - ID consulted: Dapto transitioned to IV cefazolin with MSSA - 10/11 wound vac changed - 10/15 I&D + VAC change - Remains on IV abx. No fever.  - Now plan for daily wet to dry dressing changes, no plans to return to OR - Appreciate ID. Continue cefazolin X 6 weeks (end date 11/18/23). Home infusion rep contacted. Now with single lumen PICC - Suboxone restarted 10/18. Now with f/u in pain clinic on Monday. Discharging with Suboxone and PRN dilaudid.  2. 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 ? 2/2 PVCs vs genetically mediated +/- hypertension.  - LHC: normal coronaries.  - cMRI:  LV markedly dilated EF 10% RV 17% NICM - Underwent HM-III VAD on 07/28/25. Much improved NYHA I-II  - INR stable at 1.8 . Goal 2.0-3.0. Continue Warfarin.  Discussed warfarin dosing with PharmD personally. - Volume stable. MAP stable 80s-90s - Continue digoxin 0.125 mg daily - Continue losartan 25 mg BID - Continue spiro 25 mg daily - Continue Ivabradine 5 BID 3. PVCs - Suppressed with Mexiletine 200 mg BID, now stopped 10/13/23 4. Tobacco use - reports quit smoking 7/24 - Cotinine level at 30.1 09/22/23 5. Hx drug abuse - Discharging home with Suboxone and PRN dilaudid, discussed with PharmD and MD. He has f/u  scheduled with pain clinic 10/28 who will manage pain regimen after that.   LVAD Interrogation HM II:   Speed: 5600  Flow: 4.1  PI:4.9   Power: 4.3   Back-up speed:  5300  Discharge Weight Range: 139.7 lbs Discharge Vitals: Blood pressure 93/73, pulse 74, temperature 98 F (36.7 C), temperature source Oral, resp. rate 15, height 5\' 7"  (1.702 m), weight 63.4 kg, SpO2 97%.  Labs: Lab Results   Component Value Date   WBC 6.6 10/16/2023   HGB 12.9 (L) 10/16/2023   HCT 39.2 10/16/2023   MCV 86.5 10/16/2023   PLT 287 10/16/2023    Recent Labs  Lab 10/16/23 0443  NA 139  K 4.1  CL 104  CO2 28  BUN 17  CREATININE 0.88  CALCIUM 9.0  GLUCOSE 88   Lab Results  Component Value Date   CHOL 74 07/15/2023   HDL 16 (L) 07/15/2023   LDLCALC 30 07/15/2023   TRIG 140 07/15/2023   BNP (last 3 results) Recent Labs    07/14/23 0101 07/31/23 0428 08/05/23 2320  BNP 1,810.8*  2,494.6* 487.2* 483.2*    ProBNP (last 3 results) No results for input(s): "PROBNP" in the last 8760 hours.   Diagnostic Studies/Procedures  09/29/23: CT A/P with fluid collection around driveline suggestive of abscess.  10/01/23: I/D with wound vac placement.  10/03/23: VAC change 10/15: I/D + VAC change  Discharge Medications   Allergies as of 10/16/2023       Reactions   Other Anaphylaxis   Tree Nuts   Peanuts [peanut Oil] Anaphylaxis   Chlorhexidine Rash        Medication List     STOP taking these medications    mexiletine 200 MG capsule Commonly known as: MEXITIL       TAKE these medications    acetaminophen 325 MG tablet Commonly known as: TYLENOL Take 2 tablets (650 mg total) by mouth every 4 (four) hours as needed for headache or mild pain.   albuterol 108 (90 Base) MCG/ACT inhaler Commonly known as: VENTOLIN HFA Inhale 2 puffs into the lungs every 6 (six) hours as needed for wheezing or shortness of breath. Shortness of breath   ceFAZolin IVPB Commonly known as: ANCEF Inject 2 g into the vein every 8 (eight) hours. Indication:  MSSA LVAD DLI First Dose: Yes Last Day of Therapy:  11/18/23 Labs - Once weekly:  CBC/D and BMP, Labs - Once weekly: ESR and CRP Method of administration: IV Push Method of administration may be changed at the discretion of home infusion pharmacist based upon assessment of the patient and/or caregiver's ability to self-administer the  medication ordered.   cyclobenzaprine 5 MG tablet Commonly known as: FLEXERIL Take 1 tablet (5 mg total) by mouth 2 (two) times daily as needed for muscle spasms.   digoxin 0.125 MG tablet Commonly known as: LANOXIN Take 1 tablet (0.125 mg total) by mouth daily.   gabapentin 300 MG capsule Commonly known as: NEURONTIN Take 1 capsule (300 mg total) by mouth 3 (three) times daily.   HYDROmorphone 2 MG tablet Commonly known as: Dilaudid Take 1 tablet (2 mg total) by mouth every 6 (six) hours as needed for up to 5 days for severe pain (pain score 7-10).   ivabradine 5 MG Tabs tablet Commonly known as: CORLANOR Take 1 tablet (5 mg total) by mouth 2 (two) times daily with a meal.   losartan 25 MG tablet Commonly known as: COZAAR Take 1 tablet (25 mg  total) by mouth 2 (two) times daily.   pantoprazole 40 MG tablet Commonly known as: PROTONIX Take 1 tablet (40 mg total) by mouth daily.   sertraline 25 MG tablet Commonly known as: ZOLOFT Take 1 tablet (25 mg total) by mouth daily.   spironolactone 25 MG tablet Commonly known as: ALDACTONE Take 1 tablet (25 mg total) by mouth daily. Start taking on: October 17, 2023   Suboxone 8-2 MG Film Generic drug: Buprenorphine HCl-Naloxone HCl Place 1 Film under the tongue daily.   traZODone 50 MG tablet Commonly known as: DESYREL Take 1 tablet (50 mg total) by mouth at bedtime as needed for sleep.   warfarin 1 MG tablet Commonly known as: COUMADIN Take as directed. If you are unsure how to take this medication, talk to your nurse or doctor. Original instructions: Take 3 mg (3tabs) on Monday and Friday and 2 mg (2 tablets) all other days  or as directed by the HF Clinic What changed: additional instructions   Zinc Sulfate 220 (50 Zn) MG Tabs Take 1 tablet (220 mg total) by mouth daily.               Durable Medical Equipment  (From admission, onward)           Start     Ordered   10/16/23 0704  Heart failure home  health orders  (Heart failure home health orders / Face to face)  Once       Comments: Heart Failure Follow-up Care:  Verify follow-up appointments per Patient Discharge Instructions. Confirm transportation arranged. Reconcile home medications with discharge medication list. Remove discontinued medications from use. Assist patient/caregiver to manage medications using pill box. Reinforce low sodium food selection Assessments: Vital signs and oxygen saturation at each visit. Assess home environment for safety concerns, caregiver support and availability of low-sodium foods. Consult Child psychotherapist, PT/OT, Dietitian, and CNA based on assessments. Perform comprehensive cardiopulmonary assessment. Notify MD for any change in condition or weight gain of 3 pounds in one day or 5 pounds in one week with symptoms. Daily Weights and Symptom Monitoring: Ensure patient has access to scales. Teach patient/caregiver to weigh daily before breakfast and after voiding using same scale and record.    Teach patient/caregiver to track weight and symptoms and when to notify Provider. Activity: Develop individualized activity plan with patient/caregiver.   Question Answer Comment  Heart Failure Follow-up Care [facility] at [phone]   Obtain the following labs Basic Metabolic Panel   Lab frequency Weekly   Fax lab results to [facility] Clinic at [phone]   Diet Low Sodium Heart Healthy   Fluid restrictions: 1800 mL Fluid      10/16/23 0707              Discharge Care Instructions  (From admission, onward)           Start     Ordered   10/16/23 0000  Discharge wound care:       Comments: Per VAD coordinators (wet to dry daily dressing changes)   10/16/23 1250   10/08/23 0000  Change dressing on IV access line weekly and PRN  (Home infusion instructions - [facility] )        10/08/23 0803            Disposition   The patient will be discharged in  stable condition to home. Discharge Instructions     (HEART FAILURE PATIENTS) Call MD:  Anytime you  have any of the following symptoms: 1) 3 pound weight gain in 24 hours or 5 pounds in 1 week 2) shortness of breath, with or without a dry hacking cough 3) swelling in the hands, feet or stomach 4) if you have to sleep on extra pillows at night in order to breathe.   Complete by: As directed    [facility] pharmacist to adjust dose for Vancomycin, Aminoglycosides and other anti-infective therapies as requested by physician.   Complete by: As directed    [facility] to provide Cath Flo 2mg    Complete by: As directed    Administer for PICC line occlusion and as ordered by physician for other access device issues.   Anaphylaxis Kit: Provided to treat any anaphylactic reaction to the medication being provided to the patient if First Dose or when requested by physician   Complete by: As directed    Epinephrine 1mg /ml vial / amp: Administer 0.3mg  (0.51ml) subcutaneously once for moderate to severe anaphylaxis, nurse to call physician and pharmacy when reaction occurs and call 911 if needed for immediate care   Diphenhydramine 50mg /ml IV vial: Administer 25-50mg  IV/IM PRN for first dose reaction, rash, itching, mild reaction, nurse to call physician and pharmacy when reaction occurs   Sodium Chloride 0.9% NS IV: Administer if needed for hypovolemic blood pressure drop or as ordered by physician after call to physician with anaphylactic reaction   Change dressing on IV access line weekly and PRN   Complete by: As directed    Diet - low sodium heart healthy   Complete by: As directed    Discharge wound care:   Complete by: As directed    Per VAD coordinators (wet to dry daily dressing changes)   Flush IV access with Sodium Chloride 0.9% and Heparin 10 units/ml or 100 units/ml   Complete by: As directed    Home infusion instructions - [facility]   Complete by: As  directed    Instructions: Flush IV access with Sodium Chloride 0.9% and Heparin 10units/ml or 100units/ml   Change dressing on IV access line: Weekly and PRN   Instructions Cath Flo 2mg : Administer for PICC Line occlusion and as ordered by physician for other access device   [facility] pharmacist to adjust dose for: Vancomycin, Aminoglycosides and other anti-infective therapies as requested by physician   Increase activity slowly   Complete by: As directed    Method of administration may be changed at the discretion of home infusion pharmacist based upon assessment of the patient and/or caregiver's ability to self-administer the medication ordered   Complete by: As directed        Follow-up Information     [facility]. Go in 10 day(s).   Specialty: Family Medicine Why: Hospital follow up appointment scheduled for Monday, October 20, 2023 at 10:30 AM.  PLEASE ARRIVE 10-15 minutes early.  PLEASE call and cancel/reschedule if you CANNOT make appointment. Contact information: [address] [place] [address] [phone]        [facility] Follow up.   Why: Home Infusion will arrange delivery of IV antibiotics to your home Contact information: [phone]                  Duration of Discharge Encounter: Greater than 35 minutes   Signed, Alen Bleacher AGACNP-BC  10/16/2023, 12:50 PM

## 2023-10-14 NOTE — Progress Notes (Signed)
7 Days Post-Op Procedure(s) (LRB): VAD TUNNEL DEBRIDEMENT USING MYRIAD MORCELLS FINE (N/A) WOUND VAC CHANGE (N/A) Subjective: Patient improved today following removal of wound VAC sponge yesterday.  He tolerated a abdominal wall wet-to-dry Vashe dressing change.  The wound is with 90% granulation tissue.  He is receiving IV Ancef and PICC line will be exchanged to a single-lumen.  Arrangements are being made to coordinate dressing change so that his wife can be instructed on the sterile Vashe wet-to-dry daily wound care.  VAD parameters remained stable Patient remains afebrile with normal WBC Pain control is improving with addition of Toradol to as needed oxycodone and his  Suboxone dosing.  Objective: Vital signs in last 24 hours: Temp:  [97.6 F (36.4 C)-97.9 F (36.6 C)] 97.8 F (36.6 C) (10/22 1110) Pulse Rate:  [70-88] 70 (10/22 0645) Cardiac Rhythm: Normal sinus rhythm (10/22 0800) Resp:  [14-19] 19 (10/22 1110) BP: (80-106)/(65-83) 98/78 (10/22 1110) SpO2:  [97 %-98 %] 97 % (10/22 1110) Weight:  [63.3 kg] 63.3 kg (10/22 1110)  Hemodynamic parameters for last 24 hours:    Intake/Output from previous day: 10/21 0701 - 10/22 0700 In: 780.7 [P.O.:360; I.V.:120.7; IV Piggyback:300] Out: 1200 [Urine:1200] Intake/Output this shift: No intake/output data recorded.  Exam Patient feeling more positive today more relaxed with less pain Lungs clear Normal VAD hum Surgical dressing of the abdominal VAD tunnel wound clean and dry Abdomen nontender  Lab Results: Recent Labs    10/13/23 0415 10/14/23 0510  WBC 5.8 6.3  HGB 12.0* 12.5*  HCT 37.7* 38.7*  PLT 253 268   BMET:  Recent Labs    10/13/23 0415 10/14/23 0510  NA 135 138  K 3.3* 3.9  CL 101 102  CO2 25 26  GLUCOSE 136* 130*  BUN 8 11  CREATININE 1.06 0.82  CALCIUM 8.9 9.0    PT/INR:  Recent Labs    10/14/23 0510  LABPROT 20.9*  INR 1.8*   ABG    Component Value Date/Time   PHART 7.510 (H)  08/03/2023 0445   HCO3 27.3 08/03/2023 0445   TCO2 28 08/03/2023 0445   ACIDBASEDEF 2.0 07/31/2023 1704   O2SAT 68.1 08/08/2023 0535   CBG (last 3)  No results for input(s): "GLUCAP" in the last 72 hours.  Assessment/Plan: S/P Procedure(s) (LRB): VAD TUNNEL DEBRIDEMENT USING MYRIAD MORCELLS FINE (N/A) WOUND VAC CHANGE (N/A) Continue daily wet-to-dry dressing changes with Vashe wound solution.  The wound will need to form a solid clean base of granulation tissue before transition to home care with VAD clinic visits and home IV antibiotic.   LOS: 15 days    Lovett Sox 10/14/2023

## 2023-10-14 NOTE — Progress Notes (Signed)
Patient refused to get up for weight at this time.  Unable to do bed weight since patient son in bed with him.

## 2023-10-14 NOTE — Progress Notes (Signed)
PHARMACY - ANTICOAGULATION CONSULT NOTE  Pharmacy Consult for Heparin > warfarin  Indication:  LVAD  Allergies  Allergen Reactions   Other Anaphylaxis    Tree Nuts   Peanuts [Peanut Oil] Anaphylaxis   Chlorhexidine Rash    Patient Measurements: Height: 5\' 7"  (170.2 cm) Weight: 62.3 kg (137 lb 5.6 oz) IBW/kg (Calculated) : 66.1 Heparin Dosing Weight: 63.1 kg  Vital Signs: Temp: 97.8 F (36.6 C) (10/22 0734) Temp Source: Oral (10/22 0734) BP: 85/65 (10/22 0734) Pulse Rate: 70 (10/22 0645)  Labs: Recent Labs    10/12/23 0418 10/13/23 0415 10/14/23 0510  HGB  --  12.0* 12.5*  HCT  --  37.7* 38.7*  PLT  --  253 268  LABPROT 18.7* 19.2* 20.9*  INR 1.5* 1.6* 1.8*  HEPARINUNFRC <0.10* <0.10* <0.10*  CREATININE 0.82 1.06 0.82    Estimated Creatinine Clearance: 109.7 mL/min (by C-G formula based on SCr of 0.82 mg/dL).   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 On warfarin PTA - PTA regimen is 2mg  daily except 3 mg MF. INR on admit therapeutic at 2.  INR up 1.8 - was keeping INR around 1.5 while in/out of OR for I&D/washout/VAC changes but now wound vac removed and dressings in place - no longer in need of OR. Will target regular goal range. Hgb 12, plt 253, LDH 156. Heparin level came back undetectable as expected on heparin infusion at 500 units/hr - will stop today with INR 1.8.   Goal of Therapy: INR 2-2.5  Heparin level 0.2-0.3 units/ml Monitor platelets by anticoagulation protocol: Yes   Plan:  Stop heparin infusion Warfarin 3 mg MF/2mg  AOD - as pta  Monitor daily CBC, protime and heparin level, and for s/sx of bleeding   Leota Sauers Pharm.D. CPP, BCPS Clinical  Pharmacist [phone] 10/14/2023 11:10 AM    Please check AMION for all [facility] phone numbers After 10:00 PM, call Main Pharmacy [phone]

## 2023-10-14 NOTE — Progress Notes (Signed)
Peripherally Inserted Central Catheter Exchange  The IV Nurse has discussed with the patient and/or persons authorized to consent for the patient, the purpose of this procedure and the potential benefits and risks involved with this procedure.  The benefits include less needle sticks, lab draws from the catheter, and the patient may be discharged home with the catheter. Risks include, but not limited to, infection, bleeding, blood clot (thrombus formation), and puncture of an artery; nerve damage and irregular heartbeat and possibility to perform a PICC exchange if needed/ordered by physician.  Alternatives to this procedure were also discussed.  Bard Power PICC patient education guide, fact sheet on infection prevention and patient information card has been provided to patient /or left at bedside.   Upon removal of dressing, the skin surrounding the insertion site noted to have redness where old dressing was placed. Insertion site clean, dry and intact. No s/s infection noted. Pt states allergy to CHG. Site cleaned with betadine and sterile saline prior to exchange. Site allowed to dry completely before applying tegaderm. No biopatch placed. Pt denies itching or discomfort. Primary RN made aware. RN stated she may have cleaned site with CHG at dressing change yesterday.  PICC Placement Documentation  PICC Single Lumen 10/14/23 Right Brachial 39 cm 0 cm (Active)  Indication for Insertion or Continuance of Line Chronic illness with exacerbations (CF, Sickle Cell, etc.) 10/14/23 1300  Exposed Catheter (cm) 0 cm 10/14/23 1300  Site Assessment Clean, Dry, Intact;Other (Comment) 10/14/23 1300  Line Status Flushed;Saline locked;Blood return noted 10/14/23 1300  Dressing Type Transparent;Securing device 10/14/23 1300  Dressing Status Allergy to antimicrobial disc 10/14/23 1300  Line Care Connections checked and tightened 10/14/23 1300  Line Adjustment (NICU/IV Team Only) No 10/14/23 1300  Dressing  Intervention New dressing 10/14/23 1300  Dressing Change Due 10/21/23 10/14/23 1300       Vernona Rieger  Bolden Hagerman 10/14/2023, 1:57 PM

## 2023-10-15 DIAGNOSIS — Z95811 Presence of heart assist device: Secondary | ICD-10-CM | POA: Diagnosis not present

## 2023-10-15 DIAGNOSIS — T827XXA Infection and inflammatory reaction due to other cardiac and vascular devices, implants and grafts, initial encounter: Secondary | ICD-10-CM | POA: Diagnosis not present

## 2023-10-15 DIAGNOSIS — I4891 Unspecified atrial fibrillation: Secondary | ICD-10-CM | POA: Diagnosis not present

## 2023-10-15 DIAGNOSIS — I509 Heart failure, unspecified: Secondary | ICD-10-CM | POA: Diagnosis not present

## 2023-10-15 LAB — CBC
HCT: 38.7 % — ABNORMAL LOW (ref 39.0–52.0)
Hemoglobin: 12.8 g/dL — ABNORMAL LOW (ref 13.0–17.0)
MCH: 28.6 pg (ref 26.0–34.0)
MCHC: 33.1 g/dL (ref 30.0–36.0)
MCV: 86.4 fL (ref 80.0–100.0)
Platelets: 282 10*3/uL (ref 150–400)
RBC: 4.48 MIL/uL (ref 4.22–5.81)
RDW: 14 % (ref 11.5–15.5)
WBC: 6.8 10*3/uL (ref 4.0–10.5)
nRBC: 0 % (ref 0.0–0.2)

## 2023-10-15 LAB — BASIC METABOLIC PANEL
Anion gap: 8 (ref 5–15)
BUN: 13 mg/dL (ref 6–20)
CO2: 27 mmol/L (ref 22–32)
Calcium: 8.9 mg/dL (ref 8.9–10.3)
Chloride: 101 mmol/L (ref 98–111)
Creatinine, Ser: 0.77 mg/dL (ref 0.61–1.24)
GFR, Estimated: >60 mL/min (ref >60)
Glucose, Bld: 91 mg/dL (ref 70–99)
Potassium: 3.8 mmol/L (ref 3.5–5.1)
Sodium: 136 mmol/L (ref 135–145)

## 2023-10-15 LAB — PROTIME-INR
INR: 1.9 — ABNORMAL HIGH (ref 0.8–1.2)
Prothrombin Time: 21.8 s — ABNORMAL HIGH (ref 11.4–15.2)

## 2023-10-15 LAB — LACTATE DEHYDROGENASE: LDH: 142 U/L (ref 98–192)

## 2023-10-15 MED ORDER — OXYCODONE HCL 5 MG PO TABS: 5.0000 mg | ORAL_TABLET | Freq: Three times a day (TID) | ORAL | Status: DC | PRN

## 2023-10-15 MED ORDER — GABAPENTIN 100 MG PO CAPS: 200.0000 mg | ORAL_CAPSULE | Freq: Every day | ORAL | Status: DC

## 2023-10-15 MED ORDER — SPIRONOLACTONE 25 MG PO TABS
25.0000 mg | ORAL_TABLET | Freq: Every day | ORAL | Status: DC
  Administered 2023-10-16: 25 mg via ORAL
  Filled 2023-10-15: qty 1

## 2023-10-15 MED ORDER — HYDROMORPHONE HCL 2 MG PO TABS
2.0000 mg | ORAL_TABLET | ORAL | Status: DC | PRN
  Administered 2023-10-15 – 2023-10-16 (×7): 2 mg via ORAL
  Filled 2023-10-15 (×7): qty 1

## 2023-10-15 MED ORDER — KETOROLAC TROMETHAMINE 10 MG PO TABS
10.0000 mg | ORAL_TABLET | Freq: Four times a day (QID) | ORAL | Status: DC | PRN
  Administered 2023-10-15 – 2023-10-16 (×4): 10 mg via ORAL
  Filled 2023-10-15 (×5): qty 1

## 2023-10-15 MED ORDER — GABAPENTIN 300 MG PO CAPS
300.0000 mg | ORAL_CAPSULE | Freq: Three times a day (TID) | ORAL | Status: DC
  Administered 2023-10-15 – 2023-10-16 (×5): 300 mg via ORAL
  Filled 2023-10-15 (×5): qty 1

## 2023-10-15 MED ORDER — OXYCODONE HCL 5 MG PO TABS
5.0000 mg | ORAL_TABLET | Freq: Four times a day (QID) | ORAL | Status: DC | PRN
  Administered 2023-10-15: 5 mg via ORAL
  Filled 2023-10-15: qty 1

## 2023-10-15 MED ORDER — OXYCODONE HCL 5 MG PO TABS
5.0000 mg | ORAL_TABLET | Freq: Once | ORAL | Status: AC
  Administered 2023-10-15: 5 mg via ORAL
  Filled 2023-10-15: qty 1

## 2023-10-15 NOTE — Progress Notes (Addendum)
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  Pt lying in bed on my arrival. States he has a headache this morning. Just received pain medication per bedside nurse.   VAD Coordinator to begin dressing change instructions with Penni Bombard this afternoon.   Vital signs: Temp: 98.3 HR: none documented Doppler Pressure: 86 Auto BP: 87/75 (80) O2 Sat: 96% on RA Wt: 143.3>139.1>139.5>141.3>140.4>139.7>137.7>137.3>139.6 lbs    LVAD interrogation reveals:  Speed: 5600 Flow: 3.9 Power: 4.0 w PI: 4.5  Alarms: none Events: 30 PI events Hematocrit: 37  Fixed speed: 5600 Low speed limit: 5300  Drive Line: VAD Coordinator provided supervision and verbal cues as caregiver Penni Bombard performed sterile dressing change. Existing VAD dressing removed and site care performed using sterile technique. Surrounding edge of wound bed cleansed with betadine x 4. Wound product visible. Vashe moistened 4 x 4 packed in large wound. 2 Vashe moistened 2 x 2 packed in top portion of wound. Covered with 2 large tegaderms. Anchor applied. Continue daily dressing changes by VAD coordinator or bedside nurse. Next dressing change due 10/16/23.     Labs:  LDH trend: 129>142>136>139>155>194>210>181>171>156>174>142  INR trend: 2.2>1.4>1.1>1.1>1.1>1.2>1.4>1.5>1.6>1.8>1.9  WBC trend: 8.6>7.7>7.3>5.7>6.1>8.3>7.9>7.8>5.8>6.3>6.8  Anticoagulation Plan: -INR Goal: 2-2.5 -ASA Dose: off  Blood Products:  09/30/23: 2 FFP  Infection:  09/29/23>>blood cxs x 2>> no growth x 1 days 09/29/23>>driveline cx>>MODERATE STAPHYLOCOCCUS AUREUS  10/01/23>>driveline cx OR>FEW STAPHYLOCOCCUS AUREUS; final 10/01/23>> AFB cx OR> negative; final 10/01/23> Fungus cx OR> negative; final  Adverse Events on VAD:  Plan/Recommendations:  1. Please page VAD coordinator for any alarms or VAD equipment issues. 2.  Daily dressing changes using Vashe wet/dry by VAD coordinator or bedside nurse.  Addendum: Pt complaining of irritation around PICC line. Sterile dressing change performed by this VAD Coordinator. Existing dressing removed. Site cleansed with Betadine x 2 and allowed to dry. Site red and weeping serosanguinous drainage at stat lock site. Suresite tegaderm placed below new stat lock to prevent irritation. VASHE soaked gauze placed around PICC exit site x 2. Covered with Suresite tegaderm x 2 to secure. Bedside nurse made aware.    Simmie Davies RN VAD Coordinator  Office: [phone]  24/7 Pager: [phone]

## 2023-10-15 NOTE — Progress Notes (Addendum)
Patient ID: Joel York, male   DOB: [birth year], 37 y.o.   MRN: [id number]   Advanced Heart Failure VAD Team Note  PCP-Cardiologist: None   Subjective:   09/29/23: admitted with DL infection. CT A/P with fluid collection around driveline suggestive of abscess. Wound Cx + Staph Aureus  09/30/23: developed significant bleeding from DL site>>given FFP 34/7/42: I/D with CTS with wound vac placement. Surgical wound cultures 10/9 with MSSA 10/03/23: VAC change 10/15: I/D with CTS + VAC change  Remains on IV cefazolin. ID recommending 6 weeks IV antibiotics.   Remains on heparin/warfarin. INR stable at 1.9  Pain controlled with toradol, tylenol, oxy IR, and IV dialuadid.  Denies SOB. Walked around the unit. Wants to go home.    LVAD INTERROGATION:  HeartMate II LVAD:   Flow 3.9 liters/min, speed 5600, power 4  PI 5.1 Having ~ 30 pI events  Objective:    Vital Signs:   Temp:  [97.8 F (36.6 C)-98.3 F (36.8 C)] 98.3 F (36.8 C) (10/23 0733) Pulse Rate:  [58-82] 58 (10/22 2311) Resp:  [19-20] 20 (10/23 0733) BP: (84-102)/(52-87) 87/75 (10/23 0733) SpO2:  [96 %-98 %] 96 % (10/23 0733) Weight:  [63.3 kg] 63.3 kg (10/22 1110) Last BM Date : 10/13/23 VAD Mean arterial Pressure 80-90s   Intake/Output:  No intake or output data in the 24 hours ending 10/15/23 0741    Physical Exam  Physical Exam: GENERAL: No acute distress. HEENT: normal  NECK: Supple, JVP flat  .  2+ bilaterally, no bruits.  No lymphadenopathy or thyromegaly appreciated.   CARDIAC:  Mechanical heart sounds with LVAD hum present.  LUNGS:  Clear to auscultation bilaterally.  ABDOMEN:  Soft, round, nontender, positive bowel sounds x4.     LVAD exit site: Dressing intact.  EXTREMITIES:  Warm and dry, no cyanosis, clubbing, rash or edema . RUE PICC  NEUROLOGIC:  Alert and oriented x 3.    No aphasia.  No dysarthria.  Affect pleasant.     Telemetry   Refusing tele   Labs   Basic Metabolic Panel: Recent Labs   Lab 10/11/23 0454 10/12/23 0418 10/13/23 0415 10/13/23 0500 10/14/23 0510 10/15/23 0619  NA 137 139 135  --  138 136  K 3.8 4.0 3.3*  --  3.9 3.8  CL 98 103 101  --  102 101  CO2 28 27 25   --  26 27  GLUCOSE 84 123* 136*  --  130* 91  BUN 11 10 8   --  11 13  CREATININE 0.77 0.82 1.06  --  0.82 0.77  CALCIUM 9.5 9.5 8.9  --  9.0 8.9  MG  --   --   --  1.8  --   --     Liver Function Tests: No results for input(s): "AST", "ALT", "ALKPHOS", "BILITOT", "PROT", "ALBUMIN" in the last 168 hours.  No results for input(s): "LIPASE", "AMYLASE" in the last 168 hours. No results for input(s): "AMMONIA" in the last 168 hours.  CBC: Recent Labs  Lab 10/09/23 0445 10/13/23 0415 10/14/23 0510 10/15/23 0619  WBC 7.8 5.8 6.3 6.8  HGB 12.2* 12.0* 12.5* 12.8*  HCT 37.5* 37.7* 38.7* 38.7*  MCV 86.6 88.3 88.0 86.4  PLT 282 253 268 282    INR: Recent Labs  Lab 10/11/23 0454 10/12/23 0418 10/13/23 0415 10/14/23 0510 10/15/23 0619  INR 1.5* 1.5* 1.6* 1.8* 1.9*    Other results:    Imaging   [place] EKG SITE RITE  Result Date: 10/14/2023 If [facility] image not attached, placement could not be confirmed due to current cardiac rhythm.    Medications:     Scheduled Medications:  acetaminophen  1,000 mg Oral Q6H   buprenorphine-naloxone  1 tablet Sublingual Daily   digoxin  0.125 mg Oral Daily   feeding supplement  237 mL Oral TID WC   ivabradine  5 mg Oral BID WC   losartan  25 mg Oral BID   pantoprazole  40 mg Oral Daily   polyethylene glycol  17 g Oral BID   senna-docusate  2 tablet Oral BID   sertraline  25 mg Oral Daily   sodium chloride flush  10-40 mL Intracatheter Q12H   spironolactone  12.5 mg Oral Daily   warfarin  2 mg Oral Once per day on Sunday Tuesday Wednesday Thursday Saturday   [START ON 10/17/2023] warfarin  3 mg Oral Once per day on Monday Friday   Warfarin - Pharmacist Dosing Inpatient   Does not apply q1600   zinc sulfate  220 mg Oral Daily     Infusions:   ceFAZolin (ANCEF) IV 2 g (10/15/23 0425)    PRN Medications: albuterol, HYDROmorphone, ketorolac, mouth rinse, oxyCODONE, sodium chloride flush, sorbitol, traZODone  Assessment/Plan:   1. Acute Driveline Infection - Prior clinic visit 9/30 DL site was noted by VAD RN to be not completely incorporated. Long discussion about strategies to protect DL and keep it immobilized. Reported mild itching and redness at the time. - Now admitted with driveline infection. CT chest/abd/pelvis showed abscess. Wound Cx + Staph Aureus - 10/9 debridement + wound vac>>Surgical wound cultures 10/9 with MSSA - BCx NGTD  - ID consulted: Dapto transitioned to IV cefazolin with MSSA - 10/11 wound vac changed - 10/15 I&D + VAC change - Remains on IV abx. No fever.  - Now plan for wet to dry dressing changes, no plans to return to OR - Appreciate ID. Continue cefazolin X 6 weeks (end date 11/18/23). Home infusion rep contacted. Will need to exchange PICC for single lumen prior to discharge. - Suboxone restarted 10/18. Still with site pain, getting prn oxycodone, dilaudid, tylenol, and suboxone. Stop dilaudid.  He will need follow up at a Pain Clinic.   2. 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 ? 2/2 PVCs vs genetically mediated +/- hypertension.  - LHC: normal coronaries.  - cMRI:  LV markedly dilated EF 10% RV 17% NICM - Underwent HM-III VAD on 07/28/25. Much improved NYHA I-II  - INR stable at 1.9 . Goal 2.0-3.0. Continue Warfarin.  Discussed warfarin dosing with PharmD personally. - Volume stable. MAP stable 80s-90s - Continue digoxin 0.125 mg daily - Continue losartan 25 mg BID - Continue spiro 12.5 mg daily - Continue Ivabradine 5 BID  3. PVCs - Suppressed with Mexiletine 200 mg BID, now stopped 10/13/23  4. Tobacco use - reports quit smoking 7/24 - Cotinine level at  30.1 09/22/23  5. Hx drug abuse - Plan as above  Home once pain controlled.   I reviewed the LVAD parameters from today, and compared the results to the patient's prior recorded data.  No programming changes were made.  The LVAD is functioning within specified parameters.  The patient performs LVAD self-test daily.  LVAD interrogation was negative for any significant power changes, alarms or PI events/speed drops.  LVAD equipment check completed and is  in good working order.  Back-up equipment present.   LVAD education done on emergency procedures and precautions and reviewed exit site care.  Length of Stay: 16  Tonye Becket, NP 10/15/2023, 7:41 AM  VAD Team --- VAD ISSUES ONLY--- Pager [phone] (7am - 7am)  Advanced Heart Failure Team  Pager [phone] (M-F; 7a - 5p)  Please contact [facility] for night-coverage after hours (5p -7a ) and weekends on [email or web address]

## 2023-10-15 NOTE — Plan of Care (Signed)
  Problem: Education: Goal: Understanding of CV disease, CV risk reduction, and recovery process will improve Outcome: Progressing Goal: Individualized Educational Video(s) Outcome: Progressing   Problem: Activity: Goal: Ability to return to baseline activity level will improve Outcome: Progressing   Problem: Cardiovascular: Goal: Ability to achieve and maintain adequate cardiovascular perfusion will improve Outcome: Progressing Goal: Vascular access site(s) Level 0-1 will be maintained Outcome: Progressing   Problem: Health Behavior/Discharge Planning: Goal: Ability to safely manage health-related needs after discharge will improve Outcome: Progressing   Problem: Education: Goal: Patient will understand all VAD equipment and how it functions Outcome: Progressing Goal: Patient will be able to verbalize current INR target range and antiplatelet therapy for discharge home Outcome: Progressing   Problem: Cardiac: Goal: LVAD will function as expected and patient will experience no clinical alarms Outcome: Progressing   Problem: Education: Goal: Knowledge of General Education information will improve Description: Including pain rating scale, medication(s)/side effects and non-pharmacologic comfort measures Outcome: Progressing   Problem: Health Behavior/Discharge Planning: Goal: Ability to manage health-related needs will improve Outcome: Progressing   Problem: Clinical Measurements: Goal: Ability to maintain clinical measurements within normal limits will improve Outcome: Progressing Goal: Will remain free from infection Outcome: Progressing Goal: Diagnostic test results will improve Outcome: Progressing Goal: Respiratory complications will improve Outcome: Progressing Goal: Cardiovascular complication will be avoided Outcome: Progressing   Problem: Activity: Goal: Risk for activity intolerance will decrease Outcome: Progressing   Problem: Nutrition: Goal: Adequate  nutrition will be maintained Outcome: Progressing   Problem: Coping: Goal: Level of anxiety will decrease Outcome: Progressing   Problem: Elimination: Goal: Will not experience complications related to bowel motility Outcome: Progressing Goal: Will not experience complications related to urinary retention Outcome: Progressing   Problem: Pain Managment: Goal: General experience of comfort will improve Outcome: Progressing   Problem: Safety: Goal: Ability to remain free from injury will improve Outcome: Progressing   Problem: Skin Integrity: Goal: Risk for impaired skin integrity will decrease Outcome: Progressing

## 2023-10-15 NOTE — Plan of Care (Signed)
?  Problem: Education: ?Goal: Knowledge of General Education information will improve ?Description: Including pain rating scale, medication(s)/side effects and non-pharmacologic comfort measures ?Outcome: Progressing ?  ?Problem: Health Behavior/Discharge Planning: ?Goal: Ability to manage health-related needs will improve ?Outcome: Progressing ?  ?Problem: Clinical Measurements: ?Goal: Cardiovascular complication will be avoided ?Outcome: Progressing ?  ?Problem: Pain Managment: ?Goal: General experience of comfort will improve ?Outcome: Progressing ?  ?Problem: Skin Integrity: ?Goal: Risk for impaired skin integrity will decrease ?Outcome: Progressing ?  ?

## 2023-10-15 NOTE — Progress Notes (Signed)
Patient requesting no vital signs until after 6 a.m. He says he feels he is being interrupted every night every 1 to 2 hours through the night and is frustrated by this.  Did let the patient know that even through we will not wake for vitals this night, per his request, nurse would still be in room a few times in the night to get LVAD numbers off machine and hang antibiotics. Patient was in agreement.

## 2023-10-15 NOTE — Progress Notes (Addendum)
   Called by VAD Coordinator for uncontrolled pain  Joel York is complaining of 6/10 pain. He has been getting Oxy IR 5 mg but says that isnt working at all.   Discussed with pharmacy/VAD Coordinator/Dr Elwyn Lade. Stop Oxy IR. Start po dilaudid 2 mg every 4 hours. Needs pain controlled prior to discharge.   Will need Pain Management appointment at discharge. VAD Coordinators are working on getting this set up.   Kapena Hamme NP-C  3:58 PM'

## 2023-10-15 NOTE — Progress Notes (Signed)
8 Days Post-Op Procedure(s) (LRB): VAD TUNNEL DEBRIDEMENT USING MYRIAD MORCELLS FINE (N/A) WOUND VAC CHANGE (N/A) Subjective: Pain control is improved- off iv dilaudid and now on suboxone, 5mg  oxycodone, po toradol, and preop dose of gabapentin  Daily Vashe wet/dry VAD tunnel wound dressings started - patients wife will be instructed on wound care by VAD coordinators.  New single lumen PICC in place for home iv Ancef for MSSA  Objective: Vital signs in last 24 hours: Temp:  [97.8 F (36.6 C)-98.6 F (37 C)] 98.6 F (37 C) (10/23 1054) Pulse Rate:  [58-82] 58 (10/22 2311) Resp:  [20] 20 (10/23 1054) BP: (84-109)/(52-87) 109/62 (10/23 1054) SpO2:  [96 %-98 %] 97 % (10/23 1054)  Hemodynamic parameters for last 24 hours:    Intake/Output from previous day: No intake/output data recorded. Intake/Output this shift: No intake/output data recorded.       Exam    General- alert and comfortable, mild headache this am    Neck- no JVD, no cervical adenopathy palpable, no carotid bruit   Lungs- clear without rales, wheezes   Cor- regular rate and rhythm, normal VAD hum   Abdomen- soft, non-tender, Surgical dressing clean, dry   Extremities - warm, non-tender, minimal edema   Neuro- oriented, appropriate, no focal weakness   Lab Results: Recent Labs    10/14/23 0510 10/15/23 0619  WBC 6.3 6.8  HGB 12.5* 12.8*  HCT 38.7* 38.7*  PLT 268 282   BMET:  Recent Labs    10/14/23 0510 10/15/23 0619  NA 138 136  K 3.9 3.8  CL 102 101  CO2 26 27  GLUCOSE 130* 91  BUN 11 13  CREATININE 0.82 0.77  CALCIUM 9.0 8.9    PT/INR:  Recent Labs    10/15/23 0619  LABPROT 21.8*  INR 1.9*   ABG    Component Value Date/Time   PHART 7.510 (H) 08/03/2023 0445   HCO3 27.3 08/03/2023 0445   TCO2 28 08/03/2023 0445   ACIDBASEDEF 2.0 07/31/2023 1704   O2SAT 68.1 08/08/2023 0535   CBG (last 3)  No results for input(s): "GLUCAP" in the last 72 hours.  Assessment/Plan: S/P  Procedure(s) (LRB): VAD TUNNEL DEBRIDEMENT USING MYRIAD MORCELLS FINE (N/A) WOUND VAC CHANGE (N/A) INR now therapeutic Wound care education in progress Pain control is improving Should be ready for DC home in 2-3 days   LOS: 16 days    Lovett Sox 10/15/2023

## 2023-10-15 NOTE — Progress Notes (Signed)
PHARMACY - ANTICOAGULATION CONSULT NOTE  Pharmacy Consult for Heparin > warfarin  Indication:  LVAD  Allergies  Allergen Reactions   Other Anaphylaxis    Tree Nuts   Peanuts [Peanut Oil] Anaphylaxis   Chlorhexidine Rash    Patient Measurements: Height: 5\' 7"  (170.2 cm) Weight: 63.3 kg (139 lb 9.6 oz) IBW/kg (Calculated) : 66.1 Heparin Dosing Weight: 63.1 kg  Vital Signs: Temp: 98.6 F (37 C) (10/23 1054) Temp Source: Oral (10/23 1054) BP: 109/62 (10/23 1054) Pulse Rate: 58 (10/22 2311)  Labs: Recent Labs    10/13/23 0415 10/14/23 0510 10/15/23 0619  HGB 12.0* 12.5* 12.8*  HCT 37.7* 38.7* 38.7*  PLT 253 268 282  LABPROT 19.2* 20.9* 21.8*  INR 1.6* 1.8* 1.9*  HEPARINUNFRC <0.10* <0.10*  --   CREATININE 1.06 0.82 0.77    Estimated Creatinine Clearance: 114.3 mL/min (by C-G formula based on SCr of 0.77 mg/dL).   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 On warfarin PTA - PTA regimen is 2mg  daily except 3 mg MF. INR on admit therapeutic at 2.  INR up 1.9 - was keeping INR around 1.5 while in/out of OR for I&D/washout/VAC changes but now wound vac removed and dressings in place - no longer in need of OR. Will target regular goal range. Hgb 12 stable, plt 200s stable, LDH 156. Now off heparin with INR > 1.8  Goal of Therapy: INR 2-2.5  Heparin level 0.2-0.3 units/ml Monitor platelets by anticoagulation protocol: Yes   Plan:  Continue Warfarin 3 mg MF/2mg  AOD - as pta  Monitor daily CBC, protime and heparin level, and for s/sx of bleeding   Leota Sauers Pharm.D. CPP, BCPS Clinical Pharmacist [phone] 10/15/2023 10:58 AM    Please check AMION for all [facility]  phone numbers After 10:00 PM, call Main Pharmacy [phone]

## 2023-10-16 ENCOUNTER — Other Ambulatory Visit (HOSPITAL_COMMUNITY): Payer: Self-pay

## 2023-10-16 DIAGNOSIS — T827XXS Infection and inflammatory reaction due to other cardiac and vascular devices, implants and grafts, sequela: Secondary | ICD-10-CM | POA: Diagnosis not present

## 2023-10-16 DIAGNOSIS — I509 Heart failure, unspecified: Secondary | ICD-10-CM | POA: Diagnosis not present

## 2023-10-16 DIAGNOSIS — T827XXA Infection and inflammatory reaction due to other cardiac and vascular devices, implants and grafts, initial encounter: Secondary | ICD-10-CM | POA: Diagnosis not present

## 2023-10-16 DIAGNOSIS — Z95811 Presence of heart assist device: Secondary | ICD-10-CM | POA: Diagnosis not present

## 2023-10-16 DIAGNOSIS — I4891 Unspecified atrial fibrillation: Secondary | ICD-10-CM | POA: Diagnosis not present

## 2023-10-16 LAB — CBC
HCT: 39.2 % (ref 39.0–52.0)
Hemoglobin: 12.9 g/dL — ABNORMAL LOW (ref 13.0–17.0)
MCH: 28.5 pg (ref 26.0–34.0)
MCHC: 32.9 g/dL (ref 30.0–36.0)
MCV: 86.5 fL (ref 80.0–100.0)
Platelets: 287 10*3/uL (ref 150–400)
RBC: 4.53 MIL/uL (ref 4.22–5.81)
RDW: 14.4 % (ref 11.5–15.5)
WBC: 6.6 10*3/uL (ref 4.0–10.5)
nRBC: 0 % (ref 0.0–0.2)

## 2023-10-16 LAB — PROTIME-INR
INR: 1.8 — ABNORMAL HIGH (ref 0.8–1.2)
Prothrombin Time: 20.8 s — ABNORMAL HIGH (ref 11.4–15.2)

## 2023-10-16 LAB — BASIC METABOLIC PANEL
Anion gap: 7 (ref 5–15)
BUN: 17 mg/dL (ref 6–20)
CO2: 28 mmol/L (ref 22–32)
Calcium: 9 mg/dL (ref 8.9–10.3)
Chloride: 104 mmol/L (ref 98–111)
Creatinine, Ser: 0.88 mg/dL (ref 0.61–1.24)
GFR, Estimated: >60 mL/min (ref >60)
Glucose, Bld: 88 mg/dL (ref 70–99)
Potassium: 4.1 mmol/L (ref 3.5–5.1)
Sodium: 139 mmol/L (ref 135–145)

## 2023-10-16 LAB — LACTATE DEHYDROGENASE: LDH: 165 U/L (ref 98–192)

## 2023-10-16 MED ORDER — WARFARIN SODIUM 4 MG PO TABS
4.0000 mg | ORAL_TABLET | Freq: Once | ORAL | Status: AC
  Administered 2023-10-16: 4 mg via ORAL
  Filled 2023-10-16: qty 1

## 2023-10-16 MED ORDER — WARFARIN SODIUM 1 MG PO TABS
ORAL_TABLET | ORAL | 6 refills | Status: DC
  Filled 2023-10-16: qty 75, 6d supply, fill #0
  Filled 2023-10-20: qty 75, 6d supply, fill #1

## 2023-10-16 MED ORDER — SPIRONOLACTONE 25 MG PO TABS
25.0000 mg | ORAL_TABLET | Freq: Every day | ORAL | 5 refills | Status: DC
  Filled 2023-10-16: qty 30, 30d supply, fill #0
  Filled 2023-11-22: qty 30, 30d supply, fill #1
  Filled 2024-01-15: qty 30, 30d supply, fill #2
  Filled 2024-04-17: qty 30, 30d supply, fill #3
  Filled 2024-06-16: qty 30, 30d supply, fill #4
  Filled 2024-07-27: qty 30, 30d supply, fill #5

## 2023-10-16 MED ORDER — HYDROMORPHONE HCL 1 MG/ML IJ SOLN
1.0000 mg | Freq: Once | INTRAMUSCULAR | Status: AC
  Administered 2023-10-16: 1 mg via INTRAVENOUS
  Filled 2023-10-16 (×2): qty 1

## 2023-10-16 MED ORDER — HYDROMORPHONE HCL 2 MG PO TABS
2.0000 mg | ORAL_TABLET | Freq: Four times a day (QID) | ORAL | 0 refills | Status: DC | PRN
  Filled 2023-10-16: qty 20, 5d supply, fill #0

## 2023-10-16 MED ORDER — HYDROMORPHONE HCL 2 MG PO TABS
2.0000 mg | ORAL_TABLET | ORAL | 0 refills | Status: DC | PRN
  Filled 2023-10-16: qty 30, 5d supply, fill #0

## 2023-10-16 MED ORDER — ZINC SULFATE 220 (50 ZN) MG PO TABS
220.0000 mg | ORAL_TABLET | Freq: Every day | ORAL | 5 refills | Status: DC
  Filled 2023-10-16: qty 30, 30d supply, fill #0
  Filled 2023-11-22: qty 30, 30d supply, fill #1
  Filled 2024-01-15: qty 30, 30d supply, fill #2
  Filled 2024-04-17: qty 30, 30d supply, fill #3
  Filled 2024-06-16: qty 30, 30d supply, fill #4
  Filled 2024-07-27 (×2): qty 30, 30d supply, fill #5

## 2023-10-16 NOTE — Progress Notes (Signed)
PHARMACY - ANTICOAGULATION CONSULT NOTE  Pharmacy Consult for Heparin > warfarin  Indication:  LVAD  Allergies  Allergen Reactions   Other Anaphylaxis    Tree Nuts   Peanuts [Peanut Oil] Anaphylaxis   Chlorhexidine Rash    Patient Measurements: Height: 5\' 7"  (170.2 cm) Weight: 63.4 kg (139 lb 12.4 oz) IBW/kg (Calculated) : 66.1 Heparin Dosing Weight: 63.1 kg  Vital Signs: Temp: 98 F (36.7 C) (10/24 0437) Temp Source: Oral (10/24 0437) BP: 93/73 (10/24 0437)  Labs: Recent Labs    10/14/23 0510 10/15/23 0619 10/16/23 0443  HGB 12.5* 12.8* 12.9*  HCT 38.7* 38.7* 39.2  PLT 268 282 287  LABPROT 20.9* 21.8* 20.8*  INR 1.8* 1.9* 1.8*  HEPARINUNFRC <0.10*  --   --   CREATININE 0.82 0.77 0.88    Estimated Creatinine Clearance: 104.1 mL/min (by C-G formula based on SCr of 0.88 mg/dL).   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 On warfarin PTA - PTA regimen is 2mg  daily except 3 mg MF. INR on admit therapeutic at 2.  INR 1.8 - was keeping INR around 1.5 while in/out of OR for I&D/washout/VAC changes but now wound vac removed and dressings in place - no longer in need of OR. Will target regular goal range. Hgb 12 stable, plt 200s stable, LDH 156. Now off heparin with INR > 1.8  Goal of Therapy: INR 2-2.5  Heparin level 0.2-0.3 units/ml Monitor platelets by anticoagulation protocol: Yes   Plan:  Warfrin 4mg  x1 today then  Warfarin 3 mg MF/2mg  AOD - as pta  Dc planning  Monitor daily CBC, protime and for s/sx of bleeding   Leota Sauers Pharm.D. CPP, BCPS Clinical Pharmacist [phone] 10/16/2023 11:04 AM    Please check AMION for all [facility] phone  numbers After 10:00 PM, call Main Pharmacy [phone]

## 2023-10-16 NOTE — Plan of Care (Signed)
  Problem: Education: Goal: Patient will understand all VAD equipment and how it functions Outcome: Progressing   Problem: Cardiac: Goal: LVAD will function as expected and patient will experience no clinical alarms Outcome: Progressing   Problem: Education: Goal: Knowledge of General Education information will improve Description: Including pain rating scale, medication(s)/side effects and non-pharmacologic comfort measures Outcome: Progressing   Problem: Activity: Goal: Risk for activity intolerance will decrease Outcome: Progressing   Problem: Nutrition: Goal: Adequate nutrition will be maintained Outcome: Progressing

## 2023-10-16 NOTE — Progress Notes (Signed)
Patient ID: Joel York, male   DOB: 1986-01-01, 37 y.o.   MRN: [id number]   Advanced Heart Failure VAD Team Note  PCP-Cardiologist: None   Subjective:   09/29/23: admitted with DL infection. CT A/P with fluid collection around driveline suggestive of abscess. Wound Cx + Staph Aureus  09/30/23: developed significant bleeding from DL site>>given FFP 87/5/64: I/D with CTS with wound vac placement. Surgical wound cultures 10/9 with MSSA 10/03/23: VAC change 10/15: I/D with CTS + VAC change  Remains on IV cefazolin. ID recommending 6 weeks IV antibiotics. Now with single lumen PICC  Remains on heparin/warfarin. INR stable at 1.8  Pain better controlled with toradol, tylenol, and IV dilaudid. Ready to go home.   LVAD INTERROGATION:  HeartMate II LVAD:   Flow 4.1 liters/min, speed 5600, power 4.3  PI 4.9. x13 PI events today Objective:    Vital Signs:   Temp:  [98 F (36.7 C)-98.6 F (37 C)] 98 F (36.7 C) (10/24 0437) Pulse Rate:  [74] 74 (10/23 2009) Resp:  [14-20] 15 (10/24 0437) BP: (93-109)/(62-83) 93/73 (10/24 0437) SpO2:  [95 %-97 %] 97 % (10/24 0437) Weight:  [63.4 kg] 63.4 kg (10/24 0437) Last BM Date : 10/15/23 VAD Mean arterial Pressure 80-90s   Intake/Output:   Intake/Output Summary (Last 24 hours) at 10/16/2023 0812 Last data filed at 10/16/2023 0451 Gross per 24 hour  Intake 509.78 ml  Output 700 ml  Net -190.22 ml   Physical Exam  General:  Well appearing. No resp difficulty HEENT: Normal Neck: supple. JVP ~6. Carotids 2+ bilat; no bruits. No lymphadenopathy or thyromegaly appreciated. Cor: Mechanical heart sounds with LVAD hum present. Lungs: Clear Abdomen: soft, nontender, nondistended. No hepatosplenomegaly. No bruits or masses. Good bowel sounds. Driveline: C/D/I; securement device intact and driveline incorporated Extremities: no cyanosis, clubbing, rash, edema. RUE PICC Neuro: alert & orientedx3, cranial nerves grossly intact. moves all 4 extremities  w/o difficulty. Affect pleasant    Telemetry   Refusing tele   Labs   Basic Metabolic Panel: Recent Labs  Lab 10/12/23 0418 10/13/23 0415 10/13/23 0500 10/14/23 0510 10/15/23 0619 10/16/23 0443  NA 139 135  --  138 136 139  K 4.0 3.3*  --  3.9 3.8 4.1  CL 103 101  --  102 101 104  CO2 27 25  --  26 27 28   GLUCOSE 123* 136*  --  130* 91 88  BUN 10 8  --  11 13 17   CREATININE 0.82 1.06  --  0.82 0.77 0.88  CALCIUM 9.5 8.9  --  9.0 8.9 9.0  MG  --   --  1.8  --   --   --     Liver Function Tests: No results for input(s): "AST", "ALT", "ALKPHOS", "BILITOT", "PROT", "ALBUMIN" in the last 168 hours.  No results for input(s): "LIPASE", "AMYLASE" in the last 168 hours. No results for input(s): "AMMONIA" in the last 168 hours.  CBC: Recent Labs  Lab 10/13/23 0415 10/14/23 0510 10/15/23 0619 10/16/23 0443  WBC 5.8 6.3 6.8 6.6  HGB 12.0* 12.5* 12.8* 12.9*  HCT 37.7* 38.7* 38.7* 39.2  MCV 88.3 88.0 86.4 86.5  PLT 253 268 282 287   INR: Recent Labs  Lab 10/12/23 0418 10/13/23 0415 10/14/23 0510 10/15/23 0619 10/16/23 0443  INR 1.5* 1.6* 1.8* 1.9* 1.8*   Other results:  Imaging   [place] EKG SITE RITE  Result Date: 10/14/2023 If Site Rite image not attached, placement could not be  confirmed due to current cardiac rhythm.   Medications:   Scheduled Medications:  acetaminophen  1,000 mg Oral Q6H   buprenorphine-naloxone  1 tablet Sublingual Daily   digoxin  0.125 mg Oral Daily   gabapentin  300 mg Oral TID   ivabradine  5 mg Oral BID WC   losartan  25 mg Oral BID   pantoprazole  40 mg Oral Daily   polyethylene glycol  17 g Oral BID   senna-docusate  2 tablet Oral BID   sertraline  25 mg Oral Daily   sodium chloride flush  10-40 mL Intracatheter Q12H   spironolactone  25 mg Oral Daily   warfarin  2 mg Oral Once per day on Sunday Tuesday Wednesday Thursday Saturday   [START ON 10/17/2023] warfarin  3 mg Oral Once per day on Monday Friday   Warfarin -  Pharmacist Dosing Inpatient   Does not apply q1600   zinc sulfate  220 mg Oral Daily    Infusions:   ceFAZolin (ANCEF) IV 200 mL/hr at 10/16/23 0451    PRN Medications: albuterol, HYDROmorphone, ketorolac, mouth rinse, sodium chloride flush, sorbitol, traZODone  Assessment/Plan:   1. Acute Driveline Infection - Prior clinic visit 9/30 DL site was noted by VAD RN to be not completely incorporated. Long discussion about strategies to protect DL and keep it immobilized. Reported mild itching and redness at the time. - Now admitted with driveline infection. CT chest/abd/pelvis showed abscess. Wound Cx + Staph Aureus - 10/9 debridement + wound vac>>Surgical wound cultures 10/9 with MSSA - BCx NGTD  - ID consulted: Dapto transitioned to IV cefazolin with MSSA - 10/11 wound vac changed - 10/15 I&D + VAC change - Remains on IV abx. No fever.  - Now plan for wet to dry dressing changes, no plans to return to OR - Appreciate ID. Continue cefazolin X 6 weeks (end date 11/18/23). Home infusion rep contacted. Now with single lumen PICC - Suboxone restarted 10/18. Still with site pain, getting prn dilaudid, tylenol, and suboxone.  He will need follow up at a Pain Clinic.   2. 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 ? 2/2 PVCs vs genetically mediated +/- hypertension.  - LHC: normal coronaries.  - cMRI:  LV markedly dilated EF 10% RV 17% NICM - Underwent HM-III VAD on 07/28/25. Much improved NYHA I-II  - INR stable at 1.8 . Goal 2.0-3.0. Continue Warfarin.  Discussed warfarin dosing with PharmD personally. - Volume stable. MAP stable 80s-90s - Continue digoxin 0.125 mg daily - Continue losartan 25 mg BID - Increase spiro 12.5>25 mg daily - Continue Ivabradine 5 BID  3. PVCs - Suppressed with Mexiletine 200 mg BID, now stopped 10/13/23  4. Tobacco use - reports quit smoking  7/24 - Cotinine level at 30.1 09/22/23  5. Hx drug abuse - Plan as above  Pain better controlled today. Will discuss with team about discharge timing.   I reviewed the LVAD parameters from today, and compared the results to the patient's prior recorded data.  No programming changes were made.  The LVAD is functioning within specified parameters.  The patient performs LVAD self-test daily.  LVAD interrogation was negative for any significant power changes, alarms or PI events/speed drops.  LVAD equipment check completed and is in good working order.  Back-up equipment present.   LVAD education done on emergency procedures and precautions and reviewed  exit site care.  Length of Stay: 17  Alen Bleacher, NP 10/16/2023, 8:12 AM  VAD Team --- VAD ISSUES ONLY--- Pager [phone] (7am - 7am)  Advanced Heart Failure Team  Pager [phone] (M-F; 7a - 5p)  Please contact [facility] for night-coverage after hours (5p -7a ) and weekends on [email or web address]

## 2023-10-16 NOTE — Progress Notes (Signed)
9 Days Post-Op Procedure(s) (LRB): VAD TUNNEL DEBRIDEMENT USING MYRIAD MORCELLS FINE (N/A) WOUND VAC CHANGE (N/A) Subjective: Incisional pain better controlled with po dilaudid and gabapentin. Wound is > 90% clean granulation tissue. His wife Penni Bombard is being trained on the sterile dressing change procedure with Vashe. INR is therapeutic Objective: Vital signs in last 24 hours: Temp:  [98 F (36.7 C)-98.6 F (37 C)] 98 F (36.7 C) (10/24 0437) Pulse Rate:  [74] 74 (10/23 2009) Cardiac Rhythm: Normal sinus rhythm (10/23 2009) Resp:  [14-15] 15 (10/24 0437) BP: (93-108)/(73-83) 93/73 (10/24 0437) SpO2:  [95 %-97 %] 97 % (10/24 0437) Weight:  [63.4 kg] 63.4 kg (10/24 0437)  Hemodynamic parameters for last 24 hours:  nsr  Intake/Output from previous day: 10/23 0701 - 10/24 0700 In: 509.8 [IV Piggyback:509.8] Out: 700 [Urine:700] Intake/Output this shift: Total I/O In: 120 [P.O.:120] Out: 550 [Urine:550]  EXAM Wound personally inspected and observed Wife pack with Vashe wet/dry gauze  Lab Results: Recent Labs    10/15/23 0619 10/16/23 0443  WBC 6.8 6.6  HGB 12.8* 12.9*  HCT 38.7* 39.2  PLT 282 287   BMET:  Recent Labs    10/15/23 0619 10/16/23 0443  NA 136 139  K 3.8 4.1  CL 101 104  CO2 27 28  GLUCOSE 91 88  BUN 13 17  CREATININE 0.77 0.88  CALCIUM 8.9 9.0    PT/INR:  Recent Labs    10/16/23 0443  LABPROT 20.8*  INR 1.8*   ABG    Component Value Date/Time   PHART 7.510 (H) 08/03/2023 0445   HCO3 27.3 08/03/2023 0445   TCO2 28 08/03/2023 0445   ACIDBASEDEF 2.0 07/31/2023 1704   O2SAT 68.1 08/08/2023 0535   CBG (last 3)  No results for input(s): "GLUCAP" in the last 72 hours.  Assessment/Plan: S/P Procedure(s) (LRB): VAD TUNNEL DEBRIDEMENT USING MYRIAD MORCELLS FINE (N/A) WOUND VAC CHANGE (N/A) Plan DC tomorrow Clinic visit Monday 10-28   LOS: 17 days    Lovett Sox 10/16/2023

## 2023-10-16 NOTE — Discharge Instructions (Signed)
Information on my medicine - Coumadin   (Warfarin)  This medication education was reviewed with me or my healthcare representative as part of my discharge preparation.   Why was Coumadin prescribed for you? Coumadin was prescribed for you because you have a blood clot or a medical condition that can cause an increased risk of forming blood clots. Blood clots can cause serious health problems by blocking the flow of blood to the heart, lung, or brain. Coumadin can prevent harmful blood clots from forming. As a reminder your indication for Coumadin is:  Blood Clot Prevention after Heart Pump Surgery  What test will check on my response to Coumadin? While on Coumadin (warfarin) you will need to have an INR test regularly to ensure that your dose is keeping you in the desired range. The INR (international normalized ratio) number is calculated from the result of the laboratory test called prothrombin time (PT).  If an INR APPOINTMENT HAS NOT ALREADY BEEN MADE FOR YOU please schedule an appointment to have this lab work done by your health care provider within 7 days. Your INR goal is a number between:  2 - 2.5  What  do you need to  know  About  COUMADIN? Take Coumadin (warfarin) exactly as prescribed by your healthcare provider about the same time each day.  DO NOT stop taking without talking to the doctor who prescribed the medication.  Stopping without other blood clot prevention medication to take the place of Coumadin may increase your risk of developing a new clot or stroke.  Get refills before you run out.  What do you do if you miss a dose? If you miss a dose, take it as soon as you remember on the same day then continue your regularly scheduled regimen the next day.  Do not take two doses of Coumadin at the same time.  Important Safety Information A possible side effect of Coumadin (Warfarin) is an increased risk of bleeding. You should call your healthcare provider right away if you  experience any of the following: Bleeding from an injury or your nose that does not stop. Unusual colored urine (red or dark brown) or unusual colored stools (red or black). Unusual bruising for unknown reasons. A serious fall or if you hit your head (even if there is no bleeding).  Some foods or medicines interact with Coumadin (warfarin) and might alter your response to warfarin. To help avoid this: Eat a balanced diet, maintaining a consistent amount of Vitamin K. Notify your provider about major diet changes you plan to make. Avoid alcohol or limit your intake to 1 drink for women and 2 drinks for men per day. (1 drink is 5 oz. wine, 12 oz. beer, or 1.5 oz. liquor.)  Make sure that ANY health care provider who prescribes medication for you knows that you are taking Coumadin (warfarin).  Also make sure the healthcare provider who is monitoring your Coumadin knows when you have started a new medication including herbals and non-prescription products.  Coumadin (Warfarin)  Major Drug Interactions  Increased Warfarin Effect Decreased Warfarin Effect  Alcohol (large quantities) Antibiotics (esp. Septra/Bactrim, Flagyl, Cipro) Amiodarone (Cordarone) Aspirin (ASA) Cimetidine (Tagamet) Megestrol (Megace) NSAIDs (ibuprofen, naproxen, etc.) Piroxicam (Feldene) Propafenone (Rythmol SR) Propranolol (Inderal) Isoniazid (INH) Posaconazole (Noxafil) Barbiturates (Phenobarbital) Carbamazepine (Tegretol) Chlordiazepoxide (Librium) Cholestyramine (Questran) Griseofulvin Oral Contraceptives Rifampin Sucralfate (Carafate) Vitamin K   Coumadin (Warfarin) Major Herbal Interactions  Increased Warfarin Effect Decreased Warfarin Effect  Garlic Ginseng Ginkgo biloba Coenzyme Q10 Green   tea St. John's wort    Coumadin (Warfarin) FOOD Interactions  Eat a consistent number of servings per week of foods HIGH in Vitamin K (1 serving =  cup)  Collards (cooked, or boiled & drained) Kale  (cooked, or boiled & drained) Mustard greens (cooked, or boiled & drained) Parsley *serving size only =  cup Spinach (cooked, or boiled & drained) Swiss chard (cooked, or boiled & drained) Turnip greens (cooked, or boiled & drained)  Eat a consistent number of servings per week of foods MEDIUM-HIGH in Vitamin K (1 serving = 1 cup)  Asparagus (cooked, or boiled & drained) Broccoli (cooked, boiled & drained, or raw & chopped) Brussel sprouts (cooked, or boiled & drained) *serving size only =  cup Lettuce, raw (green leaf, endive, romaine) Spinach, raw Turnip greens, raw & chopped   These websites have more information on Coumadin (warfarin):  www.coumadin.com; www.ahrq.gov/consumer/coumadin.htm;   

## 2023-10-16 NOTE — TOC Progression Note (Addendum)
Transition of Care [facility]) - Progression Note    Patient Details  Name: Joel York MRN: [id number] Date of Birth: Apr 08, 1986  [facility] of [facility]) CM/SW Contact  Adewale Pucillo, Adria Devon, RN Phone Number: 10/16/2023, 12:02 PM  Clinical Narrative:     Zollie Scale with VAD team will provide wound care education with patient and wife today at 4 pm.   Elita Quick with [facility] will provide IV ABX education today in hospital room around 5 pm. Pam will provide a HHRN through [facility] for IV education.   Patient aware and voiced understanding   Expected Discharge Plan: Home w Home Health Services Barriers to Discharge: Continued Medical Work up  Expected Discharge Plan and Services   Discharge Planning Services: CM Consult Post Acute Care Choice: Home Health                             HH Arranged: RN [facility] Agency: [facility] Date HH Agency Contacted: 10/13/23 Time HH Agency Contacted: 1551 Representative spoke with at [facility] Agency: Pam RN, [facility]   Social Determinants of Health (SDOH) Interventions SDOH Screenings   Food Insecurity: No Food Insecurity (09/29/2023)  Housing: Low Risk  (09/29/2023)  Transportation Needs: No Transportation Needs (09/29/2023)  Utilities: Not At Risk (09/29/2023)  Alcohol Screen: Low Risk  (07/14/2023)  Financial Resource Strain: Medium Risk (08/12/2023)  Physical Activity: Sufficiently Active (07/14/2023)  Stress: Stress Concern Present (07/14/2023)  Tobacco Use: High Risk (10/07/2023)    Readmission Risk Interventions     No data to display

## 2023-10-16 NOTE — Progress Notes (Signed)
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  Cultures positive for MSSA. ID recommending 6 weeks IV Cefazolin 2g IV q8h. End date 11/18/23.  Pt lying in bed on my arrival. States pain seems to be much more controlled with medication changes made yesterday afternoon. Plan for discharge this afternoon. VAD Coordinator to watch pt's wife Joel York perform dressing change again this evening prior to discharge. Follow up appointment made in VAD Clinic 11/28 at 11:00am.  VAD Coordinator spoke with pt's pain management provider Dr. Alvie Heidelberg with [facility] to coordinate care. MD made an appointment for pt to be seen Monday afternoon following appointment in VAD Clinic to assess need for temporary need for an increase dose in Suboxone given extent of abdominal wound.   Vital signs: Temp: 98 HR: none documented Doppler Pressure: 86 Auto BP: 93/73 (80) O2 Sat: 97% on RA Wt: 143.3>139.1>139.5>141.3>140.4>139.7>137.7>137.3>139.6>139.8 lbs    LVAD interrogation reveals:  Speed: 5600 Flow: 3.6 Power: 4.3 w PI: 3.6  Alarms: none Events: 30 PI events Hematocrit: 37  Fixed speed: 5600 Low speed limit: 5300  Drive Line: VAD Coordinator provided supervision and verbal cues as caregiver Joel York performed sterile dressing change. Existing VAD dressing removed and site care performed using sterile technique. Surrounding edge of wound bed cleansed with betadine x 4. Wound product visible. Vashe moistened 4 x 4 packed in large wound. 2 Vashe moistened 2 x 2 packed in top portion of wound. Covered with 2 large tegaderms. Anchor applied. Continue daily dressing changes at home with caregiver Joel York. Pt given 8 daily dressing kits, betadine, sterile 2x2s, sterile 4x4's, anchors and VASHE for home use.   Labs:  LDH trend:  129>142>136>139>155>194>210>181>171>156>174>142>165  INR trend: 2.2>1.4>1.1>1.1>1.1>1.2>1.4>1.5>1.6>1.8>1.9>1.8  WBC trend: 8.6>7.7>7.3>5.7>6.1>8.3>7.9>7.8>5.8>6.3>6.8>6.6  Anticoagulation Plan: -INR Goal: 2-2.5 -ASA Dose: off  Blood Products:  09/30/23: 2 FFP  Infection:  09/29/23>>blood cxs x 2>> no growth x 1 days 09/29/23>>driveline cx>>MODERATE STAPHYLOCOCCUS AUREUS  10/01/23>>driveline cx OR>FEW STAPHYLOCOCCUS AUREUS; final 10/01/23>> AFB cx OR> negative; final 10/01/23> Fungus cx OR> negative; final  Adverse Events on VAD: PICC:Sterile dressing change performed by this VAD Coordinator. Existing dressing removed. Site cleansed with Betadine x 2 and allowed to dry. Site red and weeping serosanguinous drainage at stat lock site; however, improved from yesterday. Suresite tegaderm placed below new stat lock to prevent irritation. VASHE soaked gauze placed around PICC exit site x 2. Covered with Suresite tegaderm x 2 to secure. Bedside nurse made aware.   VAD Coordinator spoke with [facility] and informed him about pt's sensitivity to adhesive and allergy to CHG. HHRN plans to see pt tomorrow to further assess.  Plan/Recommendations:  1. Please page VAD coordinator for any alarms or VAD equipment issues. 2. Daily dressing changes using Vashe wet/dry by VAD coordinator or bedside nurse.  Joel Davies RN VAD Coordinator  Office: [phone]  24/7 Pager: [phone]

## 2023-10-16 NOTE — TOC Progression Note (Addendum)
Transition of Care [facility]) - Progression Note    Patient Details  Name: Joel York MRN: [id number] Date of Birth: 1986-03-22  [facility] of [facility] CM/SW Contact  Nicanor Bake Phone Number: [phone] 10/16/2023, 11:35 AM  Clinical Narrative:   HF CSW met with pt at bedside. Pt appeared to be feeling much better today and engaged positively. CSW explained that the pts PCP hospital follow up appointment will be printed on dc summary. Pt stated that he has arranged transportation home.    Expected Discharge Plan: Home w Home Health Services Barriers to Discharge: Continued Medical Work up  Expected Discharge Plan and Services   Discharge Planning Services: CM Consult Post Acute Care Choice: Home Health                             HH Arranged: RN [facility] Agency: [facility] Date HH Agency Contacted: 10/13/23 Time HH Agency Contacted: 1551 Representative spoke with at [facility] Agency: Pam RN, [facility]   Social Determinants of Health (SDOH) Interventions SDOH Screenings   Food Insecurity: No Food Insecurity (09/29/2023)  Housing: Low Risk  (09/29/2023)  Transportation Needs: No Transportation Needs (09/29/2023)  Utilities: Not At Risk (09/29/2023)  Alcohol Screen: Low Risk  (07/14/2023)  Financial Resource Strain: Medium Risk (08/12/2023)  Physical Activity: Sufficiently Active (07/14/2023)  Stress: Stress Concern Present (07/14/2023)  Tobacco Use: High Risk (10/07/2023)    Readmission Risk Interventions     No data to display

## 2023-10-16 NOTE — Progress Notes (Signed)
Went over AVS paperwork with patient and patients' wife, gave TOC meds, antibiotic to be delivered to mothers house this evening, education on PICC and LVAD given, patients' belongings gathered and patient was rolled out to family vehicle.

## 2023-10-17 ENCOUNTER — Other Ambulatory Visit (HOSPITAL_COMMUNITY): Payer: Self-pay | Admitting: *Deleted

## 2023-10-17 DIAGNOSIS — I5023 Acute on chronic systolic (congestive) heart failure: Secondary | ICD-10-CM | POA: Diagnosis not present

## 2023-10-17 DIAGNOSIS — A4901 Methicillin susceptible Staphylococcus aureus infection, unspecified site: Secondary | ICD-10-CM | POA: Diagnosis not present

## 2023-10-17 DIAGNOSIS — T827XXA Infection and inflammatory reaction due to other cardiac and vascular devices, implants and grafts, initial encounter: Secondary | ICD-10-CM

## 2023-10-17 DIAGNOSIS — Z95811 Presence of heart assist device: Secondary | ICD-10-CM

## 2023-10-17 DIAGNOSIS — Z7901 Long term (current) use of anticoagulants: Secondary | ICD-10-CM

## 2023-10-20 ENCOUNTER — Other Ambulatory Visit (HOSPITAL_BASED_OUTPATIENT_CLINIC_OR_DEPARTMENT_OTHER): Payer: Self-pay

## 2023-10-20 ENCOUNTER — Ambulatory Visit (HOSPITAL_COMMUNITY): Payer: Self-pay | Admitting: Pharmacist

## 2023-10-20 ENCOUNTER — Other Ambulatory Visit: Payer: Self-pay | Admitting: Infectious Diseases

## 2023-10-20 ENCOUNTER — Telehealth: Payer: Self-pay

## 2023-10-20 ENCOUNTER — Other Ambulatory Visit: Payer: Self-pay | Admitting: Cardiothoracic Surgery

## 2023-10-20 ENCOUNTER — Other Ambulatory Visit (HOSPITAL_COMMUNITY): Payer: Self-pay

## 2023-10-20 ENCOUNTER — Inpatient Hospital Stay (INDEPENDENT_AMBULATORY_CARE_PROVIDER_SITE_OTHER): Payer: Medicaid Other | Admitting: Primary Care

## 2023-10-20 ENCOUNTER — Ambulatory Visit (HOSPITAL_COMMUNITY)
Admit: 2023-10-20 | Discharge: 2023-10-20 | Disposition: A | Discharge status: Home / Self Care | Payer: Medicaid Other | Attending: Internal Medicine | Admitting: Internal Medicine

## 2023-10-20 DIAGNOSIS — Z95811 Presence of heart assist device: Secondary | ICD-10-CM | POA: Diagnosis not present

## 2023-10-20 DIAGNOSIS — T827XXA Infection and inflammatory reaction due to other cardiac and vascular devices, implants and grafts, initial encounter: Secondary | ICD-10-CM | POA: Diagnosis not present

## 2023-10-20 DIAGNOSIS — Z7901 Long term (current) use of anticoagulants: Secondary | ICD-10-CM | POA: Diagnosis not present

## 2023-10-20 DIAGNOSIS — B9561 Methicillin susceptible Staphylococcus aureus infection as the cause of diseases classified elsewhere: Secondary | ICD-10-CM | POA: Insufficient documentation

## 2023-10-20 DIAGNOSIS — Z4509 Encounter for adjustment and management of other cardiac device: Secondary | ICD-10-CM | POA: Insufficient documentation

## 2023-10-20 LAB — PROTIME-INR
INR: 1.3 — ABNORMAL HIGH (ref 0.8–1.2)
Prothrombin Time: 16.1 s — ABNORMAL HIGH (ref 11.4–15.2)

## 2023-10-20 MED ORDER — HYDROMORPHONE HCL 2 MG PO TABS: 2.0000 mg | ORAL_TABLET | Freq: Four times a day (QID) | ORAL | 0 refills | Status: AC | PRN

## 2023-10-20 MED ORDER — HYDROXYZINE HCL 25 MG PO TABS: 25.0000 mg | ORAL_TABLET | Freq: Three times a day (TID) | ORAL | 0 refills | Status: DC | PRN

## 2023-10-20 MED ORDER — PREDNISONE 10 MG PO TABS: ORAL_TABLET | ORAL | 0 refills | Status: DC

## 2023-10-20 MED ORDER — FLUCONAZOLE 50 MG PO TABS: 50.0000 mg | ORAL_TABLET | Freq: Every day | ORAL | 0 refills | Status: DC

## 2023-10-20 MED ORDER — FLUCONAZOLE 50 MG PO TABS: 150.0000 mg | ORAL_TABLET | Freq: Every day | ORAL | 0 refills | Status: AC

## 2023-10-20 MED ORDER — TRIAMCINOLONE ACETONIDE 0.5 % EX OINT: 1.0000 | TOPICAL_OINTMENT | Freq: Two times a day (BID) | CUTANEOUS | 0 refills | Status: DC

## 2023-10-20 NOTE — Telephone Encounter (Signed)
Pt called to ensure understanding of new prescription orders and instructions.  Refill sent by Dr. Maren Beach for Diluadid as needed for pain at wound site.  New prescription for Fluconazole (Diflucan) 150mg  x 3 days and Triamcinolone ointment to be applied to rash. Pt instructed NOT to put ointment under sterile dressing or near open wound.   Rexene Alberts NP added Hydroxyzine 25mg  three times a day for itching. Pt may also take over the counter Benadryl or other antihistamines for itching as well. Pt told to HOLD on prednisone taper per Dr. Maren Beach. Will reassess need in VAD Clinic Friday.  All prescriptions sent to [facility] on [address]. Pt verbalized understanding of new prescriptions instructions.  Simmie Davies RN, BSN VAD Coordinator 24/7 Pager [phone]

## 2023-10-20 NOTE — Progress Notes (Signed)
LVAD INR 

## 2023-10-20 NOTE — Addendum Note (Signed)
Addended by: Blanchard Kelch on: 10/20/2023 02:15 PM   Modules accepted: Orders

## 2023-10-20 NOTE — Progress Notes (Addendum)
Severe itching with papular rash in shape of tape / dressing material. He is intolerant of adhesives and CHG. Also occurring on arm with PICC line.   Will reach out to see if home health can stop the CHG on the line care and defer the biopatch if it is clear he has allergy there too.   If OK with surgery team - can start prednisone quick taper to help given surface area of rash on abdomen seen.  40 mg x 2d, 30 mg x 2d, 20 mg x 2d then 10 mg to complete 10 d.   Would try allevyn foam boarder over gauze next opportunity to see if he can better tolerate silicone dressing.   Diflucan ok but would optimize dose to 150 mg tab daily x 3d.   Hydroxyzine 25 mg TID PRN itching also sent in   D/W Olivia.   Will follow up Friday with VAD team

## 2023-10-20 NOTE — Transitions of Care (Post Inpatient/ED Visit) (Signed)
10/20/2023  Name: Joel York MRN: [id number] DOB: [birth year]  Today's TOC FU Call Status: Today's TOC FU Call Status:: Successful TOC FU Call Completed TOC FU Call Complete Date: 10/20/23 Patient's Name and Date of Birth confirmed.  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 How have you been since you were released from the hospital?: Better Any questions or concerns?: No  Items Reviewed: Did you receive and understand the discharge instructions provided?: Yes Medications obtained,verified, and reconciled?: Yes (Medications Reviewed) Any new allergies since your discharge?: No Dietary orders reviewed?: No Do you have support at home?: Yes People in Home: spouse Name of Support/Comfort Primary Source: wife, Penni Bombard  Medications Reviewed Today: Medications Reviewed Today     Reviewed by Marcos Eke, RN (Registered Nurse) on 10/20/23 at 1546  Med List Status: <None>   Medication Order Taking? Sig Documenting Provider Last Dose Status Informant  acetaminophen (TYLENOL) 325 MG tablet [id number] No Take 2 tablets (650 mg total) by mouth every 4 (four) hours as needed for headache or mild pain. Tonye Becket D, NP 09/29/2023 Active Self  albuterol (VENTOLIN HFA) 108 (90 Base) MCG/ACT inhaler [id number] No Inhale 2 puffs into the lungs every 6 (six) hours as needed for wheezing or shortness of breath. Shortness of breath Bensimhon, Bevelyn Buckles, MD Past Week Active Self  ceFAZolin (ANCEF) IVPB [id number]  Inject 2 g into the vein every 8 (eight) hours. Indication:  MSSA LVAD DLI First Dose: Yes Last Day of Therapy:  11/18/23 Labs - Once weekly:  CBC/D and BMP, Labs - Once weekly: ESR and CRP Method of administration: IV Push Method of administration may be changed at the discretion of home  infusion pharmacist based upon assessment of the patient and/or caregiver's ability to self-administer the medication ordered. Andrey Farmer, PA-C  Active   cyclobenzaprine (FLEXERIL) 5 MG tablet [id number] No Take 1 tablet (5 mg total) by mouth 2 (two) times daily as needed for muscle spasms. Lovett Sox, MD Past Week Active Self  digoxin (LANOXIN) 0.125 MG tablet [id number] No Take 1 tablet (0.125 mg total) by mouth daily. Tonye Becket D, NP 09/29/2023 Active Self  fluconazole (DIFLUCAN) 50 MG tablet [id number]  Take 3 tablets (150 mg total) by mouth daily for 3 days. Blanchard Kelch, NP  Active   gabapentin (NEURONTIN) 300 MG capsule [id number] No Take 1 capsule (300 mg total) by mouth 3 (three) times daily. Tonye Becket D, NP 09/29/2023 Active Self  HYDROmorphone (DILAUDID) 2 MG tablet [id number]  Take 1 tablet (2 mg total) by mouth every 6 (six) hours as needed for up to 5 days for severe pain (pain score 7-10). Lovett Sox, MD  Active   hydrOXYzine (ATARAX) 25 MG tablet [id number]  Take 1 tablet (25 mg total) by mouth 3 (three) times daily as needed for itching. Blanchard Kelch, NP  Active   ivabradine (CORLANOR) 5 MG TABS tablet [id number] No Take 1 tablet (5 mg total) by mouth 2 (two) times daily with a meal. Bensimhon, Bevelyn Buckles, MD 09/29/2023 Active Self  losartan (COZAAR) 25 MG tablet [id number] No Take 1 tablet (25 mg total) by mouth 2 (two) times daily. Tonye Becket D, NP 09/29/2023 Active Self  pantoprazole (PROTONIX) 40 MG tablet [id number] No Take 1 tablet (40 mg total) by mouth daily. Tonye Becket D, NP 09/28/2023 Active Self  predniSONE (DELTASONE)  10 MG tablet [id number]  Take 4 tablets (40 mg total) by mouth daily with breakfast for 2 days, THEN 3 tablets (30 mg total) daily with breakfast for 2 days, THEN 2 tablets (20 mg total) daily with breakfast for 2 days, THEN 1 tablet (10 mg total) daily with breakfast for 4 days. Blanchard Kelch, NP  Active   sertraline (ZOLOFT) 25 MG tablet  [id number] No Take 1 tablet (25 mg total) by mouth daily. Tonye Becket D, NP 09/28/2023 Active Self  spironolactone (ALDACTONE) 25 MG tablet [id number]  Take 1 tablet (25 mg total) by mouth daily. Alen Bleacher, NP  Active   SUBOXONE 8-2 MG FILM [id number] No Place 1 Film under the tongue daily. [provider] 09/29/2023 Active Self  traZODone (DESYREL) 50 MG tablet [id number] No Take 1 tablet (50 mg total) by mouth at bedtime as needed for sleep. Tonye Becket D, NP 09/28/2023 Active Self  triamcinolone ointment (KENALOG) 0.5 % [id number]  Apply 1 Application topically 2 (two) times daily. Lovett Sox, MD  Active   warfarin (COUMADIN) 1 MG tablet [id number]  Take 3 mg (3tabs) on Monday and Friday and 2 mg (2 tablets) all other days  or as directed by the [facility] Clinic Alen Bleacher, NP  Active   Zinc Sulfate 220 (50 Zn) MG TABS [id number]  Take 1 tablet (220 mg total) by mouth daily. Alen Bleacher, NP  Active             Home Care and Equipment/Supplies: Were Home Health Services Ordered?: No Any new equipment or medical supplies ordered?: No  Functional Questionnaire: Do you need assistance with bathing/showering or dressing?: No Do you need assistance with meal preparation?: No Do you need assistance with eating?: No Do you have difficulty maintaining continence: No Do you need assistance with getting out of bed/getting out of a chair/moving?: No Do you have difficulty managing or taking your medications?: No  Follow up appointments reviewed: PCP Follow-up appointment confirmed?: Yes Date of PCP follow-up appointment?: 10/20/23 Follow-up Provider: F/U is primarily @ [facility]; has additional f/u @ [facility] on 11/1 and 11/19 Specialist Hospital Follow-up appointment confirmed?: Yes Date of Specialist follow-up appointment?: 11/11/23 Follow-Up Specialty Provider:: Dr. Gala Romney Do you need transportation to your follow-up appointment?: No Do you understand care options  if your condition(s) worsen?: Yes-patient verbalized understanding    Alyse Low, RN, BA, [facility], CRRN [facility] Population Health Care Management Coordinator, Transition of Care Ph # [phone]

## 2023-10-20 NOTE — Progress Notes (Addendum)
Pt presents to V[facility] Clinic for dressing change and INR alone for dressing change with Dr. Maren Beach.  Cultures positive for MSSA 09/29/23. ID recommending 6 weeks IV Cefazolin 2g IV q8h. End date 11/18/23.   Pt states dressing changes have been going well at home since discharge. He is continuing to have pain at wound site and severe itching. Pt does have a rash that extends across his abdomen, left arm and neck. 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 Dr. Maren Beach assessed rash in clinic and will send prescription of topical and oral antifungal. Pt told he can take oral Benadryl or Zyrtec for itching. VAD Coordinator will notify ID of rash.    Drive Line: Existing VAD dressing removed and site care performed using sterile technique by Dr. Maren Beach. Surrounding edge of wound bed cleansed with betadine x 4. Wound product visible. 3 Vashe moistened 2 x 2 packed in large wound. 2 Vashe moistened 2 x 2 packed in top portion of wound. Covered with dry sterile gauze and 2 large tegaderms. Cathgrip anchor re-applied. Pt educated that wife Penni Bombard can transition to using 2 x 2's in large wound bed ensuring that it is packed up tunneling portion. Pt has adequate supplies at home.      Plan: Return to VAD Clinic Friday at 11:30 for dressing change with Dr. Maren Beach Coumadin dosing per Leot[facility] Sh[facility]. 3.  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 Patient is receiving IV Ancef through PICC line. He is changing the VAD tunnel wound with Vashe wet-to-dry gauze daily.  He complains of persistent abdominal wall pain despite current medications prescribed.  I examined the patient and changed the VAD tunnel wound personally using sterile technique.  There is a good granulation base on the lower transverse wound and this was repacked with 2 x 2 gauze and Vashe covered by dry 4 x  4.  The smaller upper wound has some unhealthy granulation tissue which was debrided and the wound was irrigated with Vashe and then packed with 2 x 2 wet-to-dry gauze with Vashe solution.  Patient has a macular papular rash over the abdomen right shoulder posteriorly and left arm. This is probably a contact dermatitis which will respond to triamcinolone but since he has been on extended antibiotics we will give a short course of oral Diflucan and recheck his INR soon.   Lungs are clear No tenderness over the lower sternal incision Normal VAD hum  Plan-return to the VAD clinic for wound care November 1.  P Donata Clay MD

## 2023-10-21 ENCOUNTER — Other Ambulatory Visit: Payer: Self-pay

## 2023-10-21 ENCOUNTER — Other Ambulatory Visit: Payer: Self-pay | Admitting: Cardiothoracic Surgery

## 2023-10-21 MED ORDER — KETOROLAC TROMETHAMINE 10 MG PO TABS: 10.0000 mg | ORAL_TABLET | Freq: Four times a day (QID) | ORAL | Status: DC | PRN

## 2023-10-21 NOTE — Telephone Encounter (Signed)
CT Surgery  Patient called with persistent pain issues and requested oral toradol to supplement other meds as this worked well in hospital. Will call in Rx for toradol 10 mg PRN 6 hrs and refill as needed  P Donata Clay MD

## 2023-10-22 DIAGNOSIS — A4901 Methicillin susceptible Staphylococcus aureus infection, unspecified site: Secondary | ICD-10-CM | POA: Diagnosis not present

## 2023-10-22 DIAGNOSIS — T827XXA Infection and inflammatory reaction due to other cardiac and vascular devices, implants and grafts, initial encounter: Secondary | ICD-10-CM | POA: Diagnosis not present

## 2023-10-23 ENCOUNTER — Other Ambulatory Visit (HOSPITAL_COMMUNITY): Payer: Self-pay | Admitting: Unknown Physician Specialty

## 2023-10-23 MED ORDER — KETOROLAC TROMETHAMINE 10 MG PO TABS: 10.0000 mg | ORAL_TABLET | Freq: Four times a day (QID) | ORAL | 0 refills | Status: DC | PRN

## 2023-10-23 NOTE — Addendum Note (Signed)
Encounter addended by: Lebron Quam, RN on: 10/23/2023 3:54 PM  Actions taken: Care Plan modified, Pharmacy for encounter modified, Order list changed

## 2023-10-24 ENCOUNTER — Ambulatory Visit (HOSPITAL_COMMUNITY)
Admission: RE | Admit: 2023-10-24 | Discharge: 2023-10-24 | Disposition: A | Discharge status: Home / Self Care | Payer: Medicaid Other | Source: Ambulatory Visit | Attending: Internal Medicine | Admitting: Internal Medicine

## 2023-10-24 ENCOUNTER — Ambulatory Visit (HOSPITAL_COMMUNITY): Payer: Self-pay | Admitting: Student-PharmD

## 2023-10-24 ENCOUNTER — Other Ambulatory Visit (HOSPITAL_COMMUNITY): Payer: Self-pay | Admitting: Unknown Physician Specialty

## 2023-10-24 ENCOUNTER — Other Ambulatory Visit (HOSPITAL_COMMUNITY): Payer: Self-pay | Admitting: *Deleted

## 2023-10-24 ENCOUNTER — Encounter (HOSPITAL_COMMUNITY): Payer: Self-pay | Admitting: Unknown Physician Specialty

## 2023-10-24 DIAGNOSIS — Q8719 Other congenital malformation syndromes predominantly associated with short stature: Secondary | ICD-10-CM | POA: Insufficient documentation

## 2023-10-24 DIAGNOSIS — Z452 Encounter for adjustment and management of vascular access device: Secondary | ICD-10-CM | POA: Insufficient documentation

## 2023-10-24 DIAGNOSIS — T82528A Displacement of other cardiac and vascular devices and implants, initial encounter: Secondary | ICD-10-CM | POA: Diagnosis not present

## 2023-10-24 DIAGNOSIS — Z95811 Presence of heart assist device: Secondary | ICD-10-CM

## 2023-10-24 DIAGNOSIS — Z7901 Long term (current) use of anticoagulants: Secondary | ICD-10-CM

## 2023-10-24 DIAGNOSIS — T827XXA Infection and inflammatory reaction due to other cardiac and vascular devices, implants and grafts, initial encounter: Secondary | ICD-10-CM | POA: Diagnosis not present

## 2023-10-24 DIAGNOSIS — A4901 Methicillin susceptible Staphylococcus aureus infection, unspecified site: Secondary | ICD-10-CM

## 2023-10-24 DIAGNOSIS — I5023 Acute on chronic systolic (congestive) heart failure: Secondary | ICD-10-CM | POA: Diagnosis not present

## 2023-10-24 DIAGNOSIS — X58XXXA Exposure to other specified factors, initial encounter: Secondary | ICD-10-CM | POA: Insufficient documentation

## 2023-10-24 LAB — PROTIME-INR
INR: 1.6 — ABNORMAL HIGH (ref 0.8–1.2)
Prothrombin Time: 19.5 s — ABNORMAL HIGH (ref 11.4–15.2)

## 2023-10-24 MED ORDER — LIDOCAINE HCL 1 % IJ SOLN
INTRAMUSCULAR | Status: AC
  Filled 2023-10-24: qty 20

## 2023-10-24 MED ORDER — HEPARIN SOD (PORK) LOCK FLUSH 100 UNIT/ML IV SOLN
INTRAVENOUS | Status: AC
  Filled 2023-10-24: qty 5

## 2023-10-24 NOTE — Progress Notes (Addendum)
Pt presents to [facility] Clinic for dressing change and INR alone for dressing change with Dr. Maren Beach.  Cultures positive for MSSA 09/29/23. ID recommending 6 weeks IV Cefazolin 2g IV q8h. End date 11/18/23. Wound cultured today 11/1.   Rash improving with Diflucan, Zyrtec and Kenalog cream. Rash scabbed over on arms, neck, and back. Rash still present on abdomen but pt states this is less painful/itchy. Will hold off on starting Prednisone taper for now.   Pt called VAD coordinators this morning and reported PICC line was pulled out a significant amount. Coordinator arranged for PICC replacement in IR this afternoon.    Drive Line: Existing VAD dressing removed and site care performed using sterile technique by Dr. Maren Beach. Surrounding edge of wound bed cleansed with betadine x 2. 1 Vashe moistened 4 x 4 packed in large wound. 2 Vashe moistened 2 x 2 packed in top portion of wound. Covered with dry sterile gauze and 2 large tegaderms. Cathgrip anchor re-applied. Tenderness noted with cleansing/wound packing. Redness/rash present but improving. Moderate amount of serosanguinous drainage with scant thick green drainage present on previous dressing and wound bed. Pt educated that wife Penni Bombard can transition to using 2 x 2's in large wound bed ensuring that it is packed up tunneling portion. Provided with 7 daily kits, sterile gloves, a box of 2 x 2s, a box of 4 x 4s, and betadine swabs for home use.      Plan: Return to VAD Clinic Monday at 1:30 PM for dressing change with Dr. Donata Clay [facility]madin dosing per [facility] Pharm[facility]  Alyce Pagan RN VAD Coordinator  Office: [phone]  24/7 Pager: [phone]

## 2023-10-27 ENCOUNTER — Ambulatory Visit (HOSPITAL_COMMUNITY)
Admission: RE | Admit: 2023-10-27 | Discharge: 2023-10-27 | Disposition: A | Discharge status: Home / Self Care | Payer: Medicaid Other | Source: Ambulatory Visit | Attending: Cardiology | Admitting: Cardiology

## 2023-10-27 ENCOUNTER — Encounter (HOSPITAL_COMMUNITY): Payer: Self-pay

## 2023-10-27 DIAGNOSIS — Z4801 Encounter for change or removal of surgical wound dressing: Secondary | ICD-10-CM | POA: Diagnosis not present

## 2023-10-27 DIAGNOSIS — Z95811 Presence of heart assist device: Secondary | ICD-10-CM | POA: Insufficient documentation

## 2023-10-27 DIAGNOSIS — Z7901 Long term (current) use of anticoagulants: Secondary | ICD-10-CM | POA: Insufficient documentation

## 2023-10-27 NOTE — Progress Notes (Signed)
Pt presents to V[facility] Clinic for dressing change and INR alone for dressing change with Dr. Maren Beach.  Cultures positive for MSSA 10/24/23. ID recommending 6 weeks IV Cefazolin 2g IV q8h. End date 11/18/23.    Rash scabbed over on arms, neck, and back. Rash still present on abdomen but pt states this is less painful/itchy.   Drive Line: Existing VAD dressing removed and site care performed using sterile technique by Dr. Maren Beach. Surrounding edge of wound bed cleansed with betadine x 2. 1 Vashe moistened 4 x 4 packed in large wound. 2 Vashe moistened 2 x 2 packed in top portion of wound. Covered with dry sterile gauze and 2 large tegaderms. Cathgrip anchor re-applied. Tenderness noted with cleansing/wound packing. Redness/rash present. Moderate amount of serosanguinous drainage with scant thick yellow/brown drainage present on previous dressing and wound bed. Wound worse today then Friday. Will direct admit pt to hospital tomorrow for IV Vancomycin and surgical debridement.     On assessment of picc line there is brown drainage and redness under picc line. Picc line dressing removed. Slimy brown drainage present along with redness and induration. D/w Dr Maren Beach. Picc line d/c by Simmie Davies per Dr Donata Clay. Vashe moistened 2 x 2 placed over picc site and then covered with dry gauze and tape.     20g LUA PIV started for pts IV antitbiotic dose this evening. Pt will be direct admitted in the morning to 2c.  Plan: Admit tomorrow 11/5 for IV Vanc and surgical debridement on Thursday  Carlton Adam RN VAD Coordinator  Office: [phone]  24/7 Pager: [phone]

## 2023-10-27 NOTE — H&P (Incomplete)
Advanced Heart Failure VAD History and Physical Note   PCP-Cardiologist: None   Reason for Admission: Driveline infection, PICC line infection  HPI:    Joel York is a 37 y.o. male with HTN, ADD, asthma, hx drug abuse (now on suboxone x5 yrs), and seizures. Admitted in 07/19/23 with acute systolic heart failure -> shock. EF 10%. 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   - LHC with normal coronaries. RHC 07/24: Low CO (Fick CO/CI 3.3/1.9) and PAPi 3.0 on milrinone 0.375 . Off NE - cMRI:  LV markedly dilated EF 10% RV 17% NICM  Admitted 09/29/23 with DL infection. Wound cx + staph aureus. Underwent I&D/wound vac change x3. ID saw, discharged home on 6 weeks of IV cefazolin (end date 11/18/23).   Joel York presented to LVAD clinic yesterday and there were concerns for persistent DL infection as well as PICC line infection. PICC line was recently exchanged, now line pulled in clinic. Planned admission today for I&D with Dr. Maren Beach on Thursday. Of note he has been having issues with adhesives around these sites, has been treated with Diflucan, Zyrtec, Kenalog cream, and PRN Hydroxyzine. Steroid course considered but not started. Discussed no longer using CHG and using biopatch instead.  Resting comfortably in bed. Denies CP/SOB. Just feels tired.   LVAD INTERROGATION:  HeartMate II LVAD:  Flow 4.3 liters/min, speed 5600, power 4.3, PI 2.8.  Unable to fully interrogate device, not yet hooked to wall monitor.     Home Medications Prior to Admission medications   Medication Sig Start Date End Date Taking? Authorizing Provider  acetaminophen (TYLENOL) 325 MG tablet Take 2 tablets (650 mg total) by mouth every 4 (four) hours as needed for headache or mild pain. 08/08/23   Clegg, Amy D, NP  albuterol (VENTOLIN HFA) 108 (90 Base) MCG/ACT inhaler Inhale 2 puffs into the lungs every 6 (six) hours as needed for wheezing or shortness of breath. Shortness of breath 09/22/23   Shams Fill, Bevelyn Buckles, MD   ceFAZolin (ANCEF) IVPB Inject 2 g into the vein every 8 (eight) hours. Indication:  MSSA LVAD DLI First Dose: Yes Last Day of Therapy:  11/18/23 Labs - Once weekly:  CBC/D and BMP, Labs - Once weekly: ESR and CRP Method of administration: IV Push Method of administration may be changed at the discretion of home infusion pharmacist based upon assessment of the patient and/or caregiver's ability to self-administer the medication ordered. 10/08/23 11/19/23  Andrey Farmer, PA-C  cyclobenzaprine (FLEXERIL) 5 MG tablet Take 1 tablet (5 mg total) by mouth 2 (two) times daily as needed for muscle spasms. 08/14/23   Lovett Sox, MD  digoxin (LANOXIN) 0.125 MG tablet Take 1 tablet (0.125 mg total) by mouth daily. 08/08/23   Clegg, Amy D, NP  gabapentin (NEURONTIN) 300 MG capsule Take 1 capsule (300 mg total) by mouth 3 (three) times daily. 08/08/23   Clegg, Amy D, NP  hydrOXYzine (ATARAX) 25 MG tablet Take 1 tablet (25 mg total) by mouth 3 (three) times daily as needed for itching. 10/20/23   Blanchard Kelch, NP  ivabradine (CORLANOR) 5 MG TABS tablet Take 1 tablet (5 mg total) by mouth 2 (two) times daily with a meal. 09/22/23   Adyen Bifulco, Bevelyn Buckles, MD  ketorolac (TORADOL) 10 MG tablet Take 1 tablet (10 mg total) by mouth every 6 (six) hours as needed for up to 5 days. 10/23/23 10/28/23  Lovett Sox, MD  losartan (COZAAR) 25 MG tablet Take 1 tablet (25 mg total) by  mouth 2 (two) times daily. 08/08/23   Clegg, Amy D, NP  pantoprazole (PROTONIX) 40 MG tablet Take 1 tablet (40 mg total) by mouth daily. 08/08/23   Clegg, Amy D, NP  predniSONE (DELTASONE) 10 MG tablet Take 4 tablets (40 mg total) by mouth daily with breakfast for 2 days, THEN 3 tablets (30 mg total) daily with breakfast for 2 days, THEN 2 tablets (20 mg total) daily with breakfast for 2 days, THEN 1 tablet (10 mg total) daily with breakfast for 4 days. 10/20/23 10/30/23  Blanchard Kelch, NP  sertraline (ZOLOFT) 25 MG tablet Take 1  tablet (25 mg total) by mouth daily. 08/08/23   Clegg, Amy D, NP  spironolactone (ALDACTONE) 25 MG tablet Take 1 tablet (25 mg total) by mouth daily. 10/17/23   Alen Bleacher, NP  SUBOXONE 8-2 MG FILM Place 1 Film under the tongue daily. 07/07/23   [provider]  traZODone (DESYREL) 50 MG tablet Take 1 tablet (50 mg total) by mouth at bedtime as needed for sleep. 08/08/23   Clegg, Amy D, NP  triamcinolone ointment (KENALOG) 0.5 % Apply 1 Application topically 2 (two) times daily. 10/20/23   Lovett Sox, MD  warfarin (COUMADIN) 1 MG tablet Take 3 mg (3tabs) on Monday and Friday and 2 mg (2 tablets) all other days  or as directed by the HF Clinic 10/16/23   Alen Bleacher, NP  Zinc Sulfate 220 (50 Zn) MG TABS Take 1 tablet (220 mg total) by mouth daily. 10/16/23   Alen Bleacher, NP   Past Medical History: Past Medical History:  Diagnosis Date   Acid reflux    ADHD (attention deficit hyperactivity disorder)    Asthma    Back pain    Seizures (HCC)    Resolved   Past Surgical History: Past Surgical History:  Procedure Laterality Date   APPLICATION OF WOUND VAC N/A 10/01/2023   Procedure: APPLICATION OF WOUND VAC;  Surgeon: Lovett Sox, MD;  Location: [facility] OR;  Service: Thoracic;  Laterality: N/A;   APPLICATION OF WOUND VAC N/A 10/03/2023   Procedure: Wound Vac Change;  Surgeon: Lovett Sox, MD;  Location: [facility] OR;  Service: Thoracic;  Laterality: N/A;   APPLICATION OF WOUND VAC N/A 10/07/2023   Procedure: WOUND VAC CHANGE;  Surgeon: Lovett Sox, MD;  Location: [facility] OR;  Service: Thoracic;  Laterality: N/A;   CHOLECYSTECTOMY     INSERTION OF IMPLANTABLE LEFT VENTRICULAR ASSIST DEVICE N/A 07/30/2023   Procedure: INSERTION OF IMPLANTABLE LEFT VENTRICULAR ASSIST DEVICE;  Surgeon: Lovett Sox, MD;  Location: [facility] OR;  Service: Open Heart Surgery;  Laterality: N/A;   IR THORACENTESIS ASP PLEURAL SPACE W/IMG GUIDE  08/08/2023   RIGHT HEART CATH N/A 07/28/2023   Procedure: RIGHT HEART  CATH;  Surgeon: Dolores Patty, MD;  Location: [facility] INVASIVE CV LAB;  Service: Cardiovascular;  Laterality: N/A;   RIGHT/LEFT HEART CATH AND CORONARY ANGIOGRAPHY N/A 07/16/2023   Procedure: RIGHT/LEFT HEART CATH AND CORONARY ANGIOGRAPHY;  Surgeon: Dolores Patty, MD;  Location: [facility];  Service: Cardiovascular;  Laterality: N/A;   STERNAL WOUND DEBRIDEMENT N/A 10/01/2023   Procedure: VAD TUNNEL DEBRIDEMENT;  Surgeon: Lovett Sox, MD;  Location: [facility] OR;  Service: Thoracic;  Laterality: N/A;   STERNAL WOUND DEBRIDEMENT N/A 10/03/2023   Procedure: Wound Irrigation;  Surgeon: Lovett Sox, MD;  Location: [facility] OR;  Service: Thoracic;  Laterality: N/A;   STERNAL WOUND DEBRIDEMENT N/A 10/07/2023   Procedure: VAD TUNNEL DEBRIDEMENT  USING MYRIAD MORCELLS FINE;  Surgeon: Lovett Sox, MD;  Location: [facility] OR;  Service: Thoracic;  Laterality: N/A;   TEE WITHOUT CARDIOVERSION N/A 07/30/2023   Procedure: TRANSESOPHAGEAL ECHOCARDIOGRAM;  Surgeon: Lovett Sox, MD;  Location: [facility] OR;  Service: Open Heart Surgery;  Laterality: N/A;   TOOTH EXTRACTION N/A 07/23/2023   Procedure: DENTAL RESTORATION/EXTRACTIONS;  Surgeon: Ocie Doyne, DMD;  Location: [facility] OR;  Service: Oral Surgery;  Laterality: N/A;   WISDOM TOOTH EXTRACTION     Family History: Family History  Problem Relation Age of Onset   Crohn's disease Maternal Grandmother    Crohn's disease Maternal Uncle    Hypertension Mother        Living   Congestive Heart Failure Maternal Grandmother    Hypertension Other        Maternal Aunts & Uncles   Social History: Social History   Socioeconomic History   Marital status: Married    Spouse name: Not on file   Number of children: Not on file   Years of education: Not on file   Highest education level: Not on file  Occupational History   Not on file  Tobacco Use   Smoking status: Every Day    Types: Cigarettes   Smokeless tobacco: Current    Types: Chew  Vaping Use   Vaping  status: Never Used  Substance and Sexual Activity   Alcohol use: Not Currently   Drug use: Not Currently    Comment: last use "many years ago"   Sexual activity: Yes  Other Topics Concern   Not on file  Social History Narrative   Not on file   Social Determinants of Health   Financial Resource Strain: Medium Risk (08/12/2023)   Overall Financial Resource Strain (CARDIA)    Difficulty of Paying Living Expenses: Somewhat hard  Food Insecurity: No Food Insecurity (10/20/2023)   Hunger Vital Sign    Worried About Running Out of Food in the Last Year: Never true    Ran Out of Food in the Last Year: Never true  Transportation Needs: No Transportation Needs (10/20/2023)   PRAPARE - Administrator, Civil Service (Medical): No    Lack of Transportation (Non-Medical): No  Physical Activity: Sufficiently Active (07/14/2023)   Exercise Vital Sign    Days of Exercise per Week: 3 days    Minutes of Exercise per Session: 60 min  Stress: Stress Concern Present (07/14/2023)   [employer] of Occupational Health - Occupational Stress Questionnaire    Feeling of Stress : To some extent  Social Connections: Not on file   Allergies:  Allergies  Allergen Reactions   Chlorhexidine Dermatitis and Rash   Other Anaphylaxis    Tree Nuts   Peanuts [Peanut Oil] Anaphylaxis   Objective:    Vital Signs:   Temp:  [97.5 F (36.4 C)] 97.5 F (36.4 C) (11/05 1630) Pulse Rate:  [109] 109 (11/05 1630) Resp:  [14] 14 (11/05 1630) BP: (105)/(79) 105/79 (11/05 1643) SpO2:  [94 %] 94 % (11/05 1630) Weight:  [64 kg] 64 kg (11/05 1643)   Filed Weights   10/28/23 1643  Weight: 64 kg    Mean arterial Pressure pending  Physical Exam    General:  Well appearing. No resp difficulty HEENT: Normal Neck: supple. JVP flat. Carotids 2+ bilat; no bruits. No lymphadenopathy or thyromegaly appreciated. Cor: Mechanical heart sounds with LVAD hum present. Lungs: Clear Abdomen: soft, nontender,  nondistended. No hepatosplenomegaly. No bruits or masses. Good bowel  sounds. Driveline: C/D/I; securement device intact. Splotchy erythema around area. Only tender to touch at direct site.  Extremities: no cyanosis, clubbing, rash, edema. Redness and irritation around LUE from PICC line site.  Neuro: alert & orientedx3, cranial nerves grossly intact. moves all 4 extremities w/o difficulty. Affect pleasant   Telemetry   Not yet on tele  EKG   No new EKG to review  Labs    Basic Metabolic Panel: No results for input(s): "NA", "K", "CL", "CO2", "GLUCOSE", "BUN", "CREATININE", "CALCIUM", "MG", "PHOS" in the last 168 hours.  Liver Function Tests: No results for input(s): "AST", "ALT", "ALKPHOS", "BILITOT", "PROT", "ALBUMIN" in the last 168 hours. No results for input(s): "LIPASE", "AMYLASE" in the last 168 hours. No results for input(s): "AMMONIA" in the last 168 hours.  CBC: No results for input(s): "WBC", "NEUTROABS", "HGB", "HCT", "MCV", "PLT" in the last 168 hours.  Cardiac Enzymes: No results for input(s): "CKTOTAL", "CKMB", "CKMBINDEX", "TROPONINI" in the last 168 hours.  BNP: BNP (last 3 results) Recent Labs    07/14/23 0101 07/31/23 0428 08/05/23 2320  BNP 1,810.8*  2,494.6* 487.2* 483.2*    ProBNP (last 3 results) No results for input(s): "PROBNP" in the last 8760 hours.   CBG: No results for input(s): "GLUCAP" in the last 168 hours.  Coagulation Studies: No results for input(s): "LABPROT", "INR" in the last 72 hours.  Other results:   Imaging    No results found.  Patient Profile:  Joel York is a 37 yo male with PMH of 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 Now presenting with driveline and PICC line infection.  Assessment/Plan:   1. 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 Went for I&D/wound vac change x3. Discharged home with 6  weeks of IV abx.  - Now re-admitted with persistent driveline infection.  - Holding warfarin. Plans for OR 11/7. Start hep gtt - Wound cultures in clinic with staph aureus - Plan for Vanc and cefepime, ID to see tomorrow - ID consulted - CT C/A/P ordered  2. PICC line infection - PICC pulled in clinic 10/27/23 - line holiday? Will discuss timing of PICC placement with ID - ID consulted   3. 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 Echo EF <20%,  RV mildly reduced, - Etiology uncertain. ? 2/2 PVCs vs genetically mediated +/- hypertension.  - LHC: normal coronaries.  - cMRI:  LV markedly dilated EF 10% RV 17% NICM - Underwent HM-III VAD on 07/28/25. Much improved NYHA I-II  - INR low at 1.6 (11/1). Repeat INR. Goal 2.0-3.0. Hold Warfarin with OR plans.  Plan to start heparin gtt.  - Volume stable.  - Continue digoxin 0.125 mg daily - Continue losartan 25 mg BID - Continue spiro 25 mg daily - Continue Ivabradine 5 BID  4. Hx PVCs - Mexiletine stopped 10/13/23  5. Hx drug abuse/Tobacco abuse - reports quit smoking 7/24, cotinine level at 30.1 09/22/23 - Continue suboxone and PRN dilaudid  - Follows with pain clinic   I reviewed the LVAD parameters from today, and compared the results to the patient's prior recorded data.  No programming changes were made.  The LVAD is functioning within specified parameters.  The patient performs LVAD self-test daily.  LVAD interrogation was negative for any significant power changes, alarms or PI events/speed drops.  LVAD equipment check completed and  is in good working order.  Back-up equipment present.   LVAD education done on emergency procedures and precautions and reviewed exit site care.  Length of Stay: 0  Alen Bleacher, NP 10/28/2023, 4:54 PM  VAD Team Pager [phone] (7am - 7am) +++VAD ISSUES ONLY+++   Advanced Heart Failure Team Pager [phone] (M-F; 7a - 5p)  Please contact  [facility] for night-coverage after hours (5p -7a ) and weekends on [email or web address] for all non- LVAD Issues   Patient seen and examined with the above-signed Advanced Practice Provider and/or Housestaff. I personally reviewed laboratory data, imaging studies and relevant notes. I independently examined the patient and formulated the important aspects of the plan. I have edited the note to reflect any of my changes or salient points. I have personally discussed the plan with the patient and/or family.  37 y/o male as above with recent VAD implant c/b DL infection now being readmitted for recurrent DL infection with MSSA.   Seen by Dr. Donata Clay earlier today. Plan for IV antibiotics with wide debridement in the OR  Otherwise stable from HF perspective.  General:  NAD.  HEENT: normal  Neck: supple. JVP not elevated.  Carotids 2+ bilat; no bruits. No lymphadenopathy or thryomegaly appreciated. Cor: LVAD hum.  Lungs: Clear. Abdomen: soft, non-distended. No hepatosplenomegaly. No bruits or masses. Good bowel sounds. Driveline site with erythema and mild tenderness. Not incorporated Extremities: no cyanosis, clubbing, rash. Warm no edema  Neuro: alert & oriented x 3. No focal deficits. Moves all 4 without problem   Will admits for recurrent DL infection. Start vanc/cefepime per Dr. Donata Clay will consult ID. Suspect can likely narrow to possible ancef.   INR 1.6. Start low-dose heparin. Stop warfarin. Discussed warfarin dosing with PharmD personally.  VAD interrogated personally. Parameters stable.  Pain control as above.   [name], MD  6:50 PM

## 2023-10-28 ENCOUNTER — Encounter (HOSPITAL_COMMUNITY): Payer: Self-pay | Admitting: Cardiology

## 2023-10-28 ENCOUNTER — Other Ambulatory Visit: Payer: Self-pay

## 2023-10-28 ENCOUNTER — Inpatient Hospital Stay (HOSPITAL_COMMUNITY)
Admission: AD | Admit: 2023-10-28 | Discharge: 2023-11-13 | DRG: 264 | Disposition: A | Discharge status: Home / Self Care | Payer: Medicaid Other | Attending: Internal Medicine | Admitting: Internal Medicine

## 2023-10-28 DIAGNOSIS — Z95811 Presence of heart assist device: Secondary | ICD-10-CM | POA: Diagnosis not present

## 2023-10-28 DIAGNOSIS — T82897A Other specified complication of cardiac prosthetic devices, implants and grafts, initial encounter: Secondary | ICD-10-CM | POA: Diagnosis not present

## 2023-10-28 DIAGNOSIS — Z91018 Allergy to other foods: Secondary | ICD-10-CM | POA: Diagnosis not present

## 2023-10-28 DIAGNOSIS — Z79899 Other long term (current) drug therapy: Secondary | ICD-10-CM

## 2023-10-28 DIAGNOSIS — Z5986 Financial insecurity: Secondary | ICD-10-CM

## 2023-10-28 DIAGNOSIS — Z883 Allergy status to other anti-infective agents status: Secondary | ICD-10-CM | POA: Diagnosis not present

## 2023-10-28 DIAGNOSIS — Z8249 Family history of ischemic heart disease and other diseases of the circulatory system: Secondary | ICD-10-CM | POA: Diagnosis not present

## 2023-10-28 DIAGNOSIS — I509 Heart failure, unspecified: Secondary | ICD-10-CM | POA: Diagnosis not present

## 2023-10-28 DIAGNOSIS — T827XXA Infection and inflammatory reaction due to other cardiac and vascular devices, implants and grafts, initial encounter: Principal | ICD-10-CM

## 2023-10-28 DIAGNOSIS — I110 Hypertensive heart disease with heart failure: Secondary | ICD-10-CM | POA: Diagnosis present

## 2023-10-28 DIAGNOSIS — F17220 Nicotine dependence, chewing tobacco, uncomplicated: Secondary | ICD-10-CM | POA: Diagnosis not present

## 2023-10-28 DIAGNOSIS — F1120 Opioid dependence, uncomplicated: Secondary | ICD-10-CM | POA: Diagnosis present

## 2023-10-28 DIAGNOSIS — T827XXD Infection and inflammatory reaction due to other cardiac and vascular devices, implants and grafts, subsequent encounter: Secondary | ICD-10-CM | POA: Diagnosis not present

## 2023-10-28 DIAGNOSIS — F17210 Nicotine dependence, cigarettes, uncomplicated: Secondary | ICD-10-CM | POA: Diagnosis present

## 2023-10-28 DIAGNOSIS — G8918 Other acute postprocedural pain: Secondary | ICD-10-CM | POA: Diagnosis not present

## 2023-10-28 DIAGNOSIS — I428 Other cardiomyopathies: Secondary | ICD-10-CM | POA: Diagnosis present

## 2023-10-28 DIAGNOSIS — I5022 Chronic systolic (congestive) heart failure: Secondary | ICD-10-CM | POA: Diagnosis not present

## 2023-10-28 DIAGNOSIS — L03113 Cellulitis of right upper limb: Secondary | ICD-10-CM | POA: Diagnosis present

## 2023-10-28 DIAGNOSIS — J45909 Unspecified asthma, uncomplicated: Secondary | ICD-10-CM | POA: Diagnosis not present

## 2023-10-28 DIAGNOSIS — Y831 Surgical operation with implant of artificial internal device as the cause of abnormal reaction of the patient, or of later complication, without mention of misadventure at the time of the procedure: Secondary | ICD-10-CM | POA: Diagnosis present

## 2023-10-28 DIAGNOSIS — I5082 Biventricular heart failure: Secondary | ICD-10-CM | POA: Diagnosis present

## 2023-10-28 DIAGNOSIS — T80212D Local infection due to central venous catheter, subsequent encounter: Secondary | ICD-10-CM

## 2023-10-28 DIAGNOSIS — E876 Hypokalemia: Secondary | ICD-10-CM | POA: Diagnosis not present

## 2023-10-28 DIAGNOSIS — Z91010 Allergy to peanuts: Secondary | ICD-10-CM | POA: Diagnosis not present

## 2023-10-28 DIAGNOSIS — I472 Ventricular tachycardia, unspecified: Secondary | ICD-10-CM | POA: Diagnosis not present

## 2023-10-28 DIAGNOSIS — Z765 Malingerer [conscious simulation]: Secondary | ICD-10-CM

## 2023-10-28 DIAGNOSIS — B952 Enterococcus as the cause of diseases classified elsewhere: Secondary | ICD-10-CM | POA: Diagnosis present

## 2023-10-28 DIAGNOSIS — B9561 Methicillin susceptible Staphylococcus aureus infection as the cause of diseases classified elsewhere: Secondary | ICD-10-CM | POA: Diagnosis present

## 2023-10-28 HISTORY — DX: Presence of heart assist device: Z95.811

## 2023-10-28 LAB — COMPREHENSIVE METABOLIC PANEL
ALT: 8 U/L (ref 0–44)
AST: 16 U/L (ref 15–41)
Albumin: 3.5 g/dL (ref 3.5–5.0)
Alkaline Phosphatase: 150 U/L — ABNORMAL HIGH (ref 38–126)
Anion gap: 11 (ref 5–15)
BUN: 11 mg/dL (ref 6–20)
CO2: 27 mmol/L (ref 22–32)
Calcium: 9 mg/dL (ref 8.9–10.3)
Chloride: 103 mmol/L (ref 98–111)
Creatinine, Ser: 0.76 mg/dL (ref 0.61–1.24)
GFR, Estimated: >60 mL/min (ref >60)
Glucose, Bld: 94 mg/dL (ref 70–99)
Potassium: 3.4 mmol/L — ABNORMAL LOW (ref 3.5–5.1)
Sodium: 141 mmol/L (ref 135–145)
Total Bilirubin: 0.5 mg/dL (ref <1.2)
Total Protein: 7.2 g/dL (ref 6.5–8.1)

## 2023-10-28 LAB — CBC WITH DIFFERENTIAL/PLATELET
Abs Immature Granulocytes: 0.02 10*3/uL (ref 0.00–0.07)
Basophils Absolute: 0 10*3/uL (ref 0.0–0.1)
Basophils Relative: 1 %
Eosinophils Absolute: 0.7 10*3/uL — ABNORMAL HIGH (ref 0.0–0.5)
Eosinophils Relative: 11 %
HCT: 38.7 % — ABNORMAL LOW (ref 39.0–52.0)
Hemoglobin: 12.1 g/dL — ABNORMAL LOW (ref 13.0–17.0)
Immature Granulocytes: 0 %
Lymphocytes Relative: 23 %
Lymphs Abs: 1.6 10*3/uL (ref 0.7–4.0)
MCH: 27.7 pg (ref 26.0–34.0)
MCHC: 31.3 g/dL (ref 30.0–36.0)
MCV: 88.6 fL (ref 80.0–100.0)
Monocytes Absolute: 0.8 10*3/uL (ref 0.1–1.0)
Monocytes Relative: 12 %
Neutro Abs: 3.8 10*3/uL (ref 1.7–7.7)
Neutrophils Relative %: 53 %
Platelets: 279 10*3/uL (ref 150–400)
RBC: 4.37 MIL/uL (ref 4.22–5.81)
RDW: 14.3 % (ref 11.5–15.5)
WBC: 6.9 10*3/uL (ref 4.0–10.5)
nRBC: 0 % (ref 0.0–0.2)

## 2023-10-28 LAB — PROTIME-INR
INR: 2.1 — ABNORMAL HIGH (ref 0.8–1.2)
Prothrombin Time: 23.8 s — ABNORMAL HIGH (ref 11.4–15.2)

## 2023-10-28 LAB — MAGNESIUM: Magnesium: 1.9 mg/dL (ref 1.7–2.4)

## 2023-10-28 LAB — LACTATE DEHYDROGENASE: LDH: 153 U/L (ref 98–192)

## 2023-10-28 MED ORDER — HYDROXYZINE HCL 25 MG PO TABS
25.0000 mg | ORAL_TABLET | Freq: Three times a day (TID) | ORAL | Status: DC | PRN
  Administered 2023-10-28 – 2023-11-11 (×14): 25 mg via ORAL
  Filled 2023-10-28 (×14): qty 1

## 2023-10-28 MED ORDER — VANCOMYCIN HCL 1250 MG/250ML IV SOLN
1250.0000 mg | Freq: Two times a day (BID) | INTRAVENOUS | Status: DC
  Administered 2023-10-29: 1250 mg via INTRAVENOUS
  Filled 2023-10-28: qty 250

## 2023-10-28 MED ORDER — SODIUM CHLORIDE 0.9 % IV SOLN
2.0000 g | Freq: Three times a day (TID) | INTRAVENOUS | Status: DC
  Administered 2023-10-28 – 2023-10-29 (×3): 2 g via INTRAVENOUS
  Filled 2023-10-28 (×3): qty 12.5

## 2023-10-28 MED ORDER — PANTOPRAZOLE SODIUM 40 MG PO TBEC
40.0000 mg | DELAYED_RELEASE_TABLET | Freq: Every day | ORAL | Status: DC
  Administered 2023-10-29 – 2023-11-13 (×16): 40 mg via ORAL
  Filled 2023-10-28 (×16): qty 1

## 2023-10-28 MED ORDER — DIGOXIN 125 MCG PO TABS
0.1250 mg | ORAL_TABLET | Freq: Every day | ORAL | Status: DC
  Administered 2023-10-29 – 2023-11-13 (×16): 0.125 mg via ORAL
  Filled 2023-10-28 (×17): qty 1

## 2023-10-28 MED ORDER — SPIRONOLACTONE 25 MG PO TABS
25.0000 mg | ORAL_TABLET | Freq: Every day | ORAL | Status: DC
  Administered 2023-10-29 – 2023-11-13 (×16): 25 mg via ORAL
  Filled 2023-10-28 (×16): qty 1

## 2023-10-28 MED ORDER — VANCOMYCIN HCL 1250 MG/250ML IV SOLN
1250.0000 mg | Freq: Once | INTRAVENOUS | Status: AC
  Administered 2023-10-28: 1250 mg via INTRAVENOUS
  Filled 2023-10-28: qty 250

## 2023-10-28 MED ORDER — ZINC SULFATE 220 (50 ZN) MG PO CAPS
220.0000 mg | ORAL_CAPSULE | Freq: Every day | ORAL | Status: DC
  Administered 2023-10-29 – 2023-11-13 (×16): 220 mg via ORAL
  Filled 2023-10-28 (×16): qty 1

## 2023-10-28 MED ORDER — TRAZODONE HCL 50 MG PO TABS
50.0000 mg | ORAL_TABLET | Freq: Every evening | ORAL | Status: DC | PRN
  Administered 2023-10-28 – 2023-11-12 (×12): 50 mg via ORAL
  Filled 2023-10-28 (×12): qty 1

## 2023-10-28 MED ORDER — IVABRADINE HCL 5 MG PO TABS
5.0000 mg | ORAL_TABLET | Freq: Two times a day (BID) | ORAL | Status: DC
  Administered 2023-10-28 – 2023-11-13 (×31): 5 mg via ORAL
  Filled 2023-10-28 (×34): qty 1

## 2023-10-28 MED ORDER — HYDROMORPHONE HCL 1 MG/ML IJ SOLN
INTRAMUSCULAR | Status: AC
  Administered 2023-10-28: 2 mg
  Filled 2023-10-28: qty 2

## 2023-10-28 MED ORDER — LOSARTAN POTASSIUM 25 MG PO TABS: 25.0000 mg | ORAL_TABLET | Freq: Every day | ORAL | Status: DC

## 2023-10-28 MED ORDER — LOSARTAN POTASSIUM 25 MG PO TABS
25.0000 mg | ORAL_TABLET | Freq: Two times a day (BID) | ORAL | Status: DC
  Administered 2023-10-28 – 2023-11-02 (×9): 25 mg via ORAL
  Filled 2023-10-28 (×9): qty 1

## 2023-10-28 MED ORDER — GABAPENTIN 300 MG PO CAPS
300.0000 mg | ORAL_CAPSULE | Freq: Three times a day (TID) | ORAL | Status: DC
  Administered 2023-10-28 – 2023-11-13 (×47): 300 mg via ORAL
  Filled 2023-10-28 (×47): qty 1

## 2023-10-28 MED ORDER — ACETAMINOPHEN 325 MG PO TABS
650.0000 mg | ORAL_TABLET | ORAL | Status: DC | PRN
  Administered 2023-10-28 – 2023-11-09 (×12): 650 mg via ORAL
  Filled 2023-10-28 (×12): qty 2

## 2023-10-28 MED ORDER — BUPRENORPHINE HCL-NALOXONE HCL 8-2 MG SL SUBL
1.0000 | SUBLINGUAL_TABLET | Freq: Every day | SUBLINGUAL | Status: DC
  Administered 2023-10-29 – 2023-11-13 (×16): 1 via SUBLINGUAL
  Filled 2023-10-28 (×16): qty 1

## 2023-10-28 MED ORDER — SERTRALINE HCL 25 MG PO TABS
25.0000 mg | ORAL_TABLET | Freq: Every day | ORAL | Status: DC
  Administered 2023-10-29 – 2023-11-13 (×16): 25 mg via ORAL
  Filled 2023-10-28 (×16): qty 1

## 2023-10-28 MED ORDER — HYDROMORPHONE HCL 1 MG/ML IJ SOLN: 2.0000 mg | Freq: Once | INTRAMUSCULAR | Status: DC

## 2023-10-28 MED ORDER — HYDROMORPHONE HCL 2 MG PO TABS
2.0000 mg | ORAL_TABLET | Freq: Four times a day (QID) | ORAL | Status: DC | PRN
  Administered 2023-10-28 – 2023-11-04 (×12): 2 mg via ORAL
  Filled 2023-10-28 (×12): qty 1

## 2023-10-28 NOTE — Progress Notes (Signed)
LVAD Coordinator Rounding Note:  Admitted 10/28/23 to heart failure service due to worsening driveline infection. Pt seen in VAD clinic 11/4- wound looked progressively worse. Decision was made to admit for further IV antibiotics and debridement.   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  Cultures positive for MSSA 10/24/23. ID recommending 6 weeks IV Cefazolin 2g IV q8h. End date 11/18/23.   Met pt at bedside. Denies complaints at this time.   Rash scabbed over on arms, neck, and back. Rash still present on abdomen but pt states this is less painful/itchy.   Plan for wound debridement and wound vac placement in OR Thursday afternoon per Dr Donata Clay. Until then VAD coordinator will perform daily dressing changes with Dr Donata Clay.   Vital signs: Temp: 97.5  HR: 111 Doppler Pressure: 82 Auto BP: 105/79 (88) O2 Sat: 94% on RA Wt: 141.1 lbs    LVAD interrogation reveals:  Speed: 5600 Flow: 3.6 Power: 4.3 w PI: 3.6  Alarms: none Events: 30 PI events Hematocrit: 37  Fixed speed: 5600 Low speed limit: 5300  Drive Line: Existing VAD dressing removed and site care performed using sterile technique by Dr. Maren Beach. Surrounding edge of wound bed cleansed with betadine x 2. 1 Vashe moistened 4 x 4 packed in large wound. 2 Vashe moistened 2 x 2 packed in top portion of wound. Covered with dry sterile gauze and 2 large tegaderms. Cathgrip anchor re-applied. Tenderness noted with cleansing/wound packing. Redness/rash present. Moderate amount of serosanguinous drainage with scant thick yellow/brown drainage present on previous dressing and wound bed. Continue daily wet to dry VASHE dressing changes per VAD coordinator. Next dressing change due: 10/29/23.   Labs:  LDH trend:   INR trend:   WBC trend:   Anticoagulation Plan: -INR Goal: 2-2.5 -ASA Dose: off  Blood Products:    Infection:  10/24/23>>wound cx>>STAPH AUREUS  Adverse Events on VAD: - Admitted 09/29/23  with DL infection. Wound cx + staph aureus. Underwent I&D/wound vac change x3. ID saw, discharged home on 6 weeks of IV cefazolin (end date 11/18/23).   Plan/Recommendations:  1. Please page VAD coordinator for any alarms or VAD equipment issues. 2. Daily dressing changes using Vashe wet/dry by VAD coordinator.  Alyce Pagan RN VAD Coordinator  Office: [phone]  24/7 Pager: [phone]

## 2023-10-28 NOTE — Progress Notes (Signed)
PHARMACY - ANTICOAGULATION CONSULT NOTE  Pharmacy Consult for heparin and warfarin Indication: LVAD  Allergies  Allergen Reactions   Chlorhexidine Dermatitis and Rash   Other Anaphylaxis    Tree Nuts   Peanuts [Peanut Oil] Anaphylaxis    Patient Measurements: Height: 5\' 7"  (170.2 cm) Weight: 64 kg (141 lb 1.5 oz) IBW/kg (Calculated) : 66.1 Heparin Dosing Weight: 64 kg  Vital Signs: Temp: 97.7 F (36.5 C) (11/05 1913) Temp Source: Oral (11/05 1913) BP: 99/75 (11/05 1913) Pulse Rate: 90 (11/05 1913)  Labs: Recent Labs    10/28/23 2135  HGB 12.1*  HCT 38.7*  PLT 279  LABPROT 23.8*  INR 2.1*    Estimated Creatinine Clearance: 105.1 mL/min (by C-G formula based on SCr of 0.88 mg/dL).   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 Patient had significant bleeding at driveline site on 09/30/23 requiring FFP. PTA Warfarin is on hold. Pharmacy consulted for heparin.   INR 2.1. Hold heparin for now.   Warfarin PTA: 3mg  MWF, 2mg  TTSS but INRs have been subtherapeutic   Goal of Therapy:  Heparin level 0.3-0.5 units/ml INR 2- 2.5  Monitor platelets by anticoagulation protocol: Yes   Plan:  F/u INR, start heparin with INR < 2  Monitor daily INR, heparin level, CBC, signs/symptoms of bleeding  F/u restart warfarin after OR   Alphia Moh, PharmD, BCPS, BCCP Clinical Pharmacist  Please check AMION for all [facility] Pharmacy phone numbers After 10:00 PM, call Main Pharmacy [phone]

## 2023-10-28 NOTE — Progress Notes (Signed)
Procedure(s) (LRB): DEBRIDEMENT OF VAD TUNNEL (N/A) POSSIBLE APPLICATION OF VERAFLO WOUND VAC (N/A) Subjective: Patient examined and VAD tunnel wound personally performed. Patient being readmitted for failure of outpatient wound care with associated PICC line failure and new evidence of MRSA involvement of the wound. He is planned for IV antibiotics and further surgical debridement in the OR 11-7. CT scan planned later today  The distal aspect of the abdominal wound is almost closed. The proximal aspect of the wound at the power cord has unhealthy granulation tissue which will be debrided. Will hold coumadin before OR in 36 hrs Objective: Vital signs in last 24 hours: Temp:  [97.5 F (36.4 C)] 97.5 F (36.4 C) (11/05 1630) Pulse Rate:  [109] 109 (11/05 1630) Cardiac Rhythm: Sinus tachycardia;Bundle branch block (11/05 1640) Resp:  [14] 14 (11/05 1630) BP: (105)/(79) 105/79 (11/05 1643) SpO2:  [94 %] 94 % (11/05 1630) Weight:  [64 kg] 64 kg (11/05 1643)  Hemodynamic parameters for last 24 hours:  nsr  Intake/Output from previous day: No intake/output data recorded. Intake/Output this shift: No intake/output data recorded.  EXAM  Alert and comfortable Lungs clear Normal VAD hum No pedal edema  Lab Results: No results for input(s): "WBC", "HGB", "HCT", "PLT" in the last 72 hours. BMET: No results for input(s): "NA", "K", "CL", "CO2", "GLUCOSE", "BUN", "CREATININE", "CALCIUM" in the last 72 hours.  PT/INR: No results for input(s): "LABPROT", "INR" in the last 72 hours. ABG    Component Value Date/Time   PHART 7.510 (H) 08/03/2023 0445   HCO3 27.3 08/03/2023 0445   TCO2 28 08/03/2023 0445   ACIDBASEDEF 2.0 07/31/2023 1704   O2SAT 68.1 08/08/2023 0535   CBG (last 3)  No results for input(s): "GLUCAP" in the last 72 hours.  Assessment/Plan: S/P Procedure(s) (LRB): DEBRIDEMENT OF VAD TUNNEL (N/A) POSSIBLE APPLICATION OF VERAFLO WOUND VAC (N/A) Admit for wound care,  iv antibiotics, surgical debridement of the VAD tunnel in upper abdominal wall.   LOS: 0 days    Joel York 10/28/2023

## 2023-10-28 NOTE — Progress Notes (Signed)
Pharmacy Antibiotic Note  Joel York is a 37 y.o. male admitted on 10/28/2023 with VAD driveline infection.  Pharmacy has been consulted for vancomycin dosing. Patient is also on cefepime.   11/5 Vancomycin 1250 mg Q 12 hr with Est AUC: 524 Scr used: 0.8 mg/dL; Vd coeff: 0.72 L/kg  Plan: Vancomycin 1250mg  q12hr Cefepime 2g q8hr  Monitor cultures, clinical status, renal function, vancomycin level Narrow abx as able and f/u duration    Height: 5\' 7"  (170.2 cm) Weight: 64 kg (141 lb 1.5 oz) IBW/kg (Calculated) : 66.1  Temp (24hrs), Avg:97.6 F (36.4 C), Min:97.5 F (36.4 C), Max:97.7 F (36.5 C)  Recent Labs  Lab 10/28/23 2135  WBC 6.9  CREATININE 0.76    Estimated Creatinine Clearance: 115.6 mL/min (by C-G formula based on SCr of 0.76 mg/dL).    Allergies  Allergen Reactions   Chlorhexidine Dermatitis and Rash   Other Anaphylaxis    Tree Nuts   Peanuts [Peanut Oil] Anaphylaxis    Antimicrobials this admission: cefe 11/5 >>  Vanc 11/5 >>   Dose adjustments this admission: N/a  Microbiology results: None ordered at this time   Thank you for allowing pharmacy to be a part of this patient's care.   Alphia Moh, PharmD, BCPS, BCCP Clinical Pharmacist  Please check AMION for all [facility] Pharmacy phone numbers After 10:00 PM, call Main Pharmacy [phone]

## 2023-10-28 NOTE — Plan of Care (Signed)
  Problem: Cardiac: Goal: LVAD will function as expected and patient will experience no clinical alarms Outcome: Progressing   Problem: Activity: Goal: Risk for activity intolerance will decrease Outcome: Progressing   Problem: Nutrition: Goal: Adequate nutrition will be maintained Outcome: Progressing   Problem: Coping: Goal: Level of anxiety will decrease Outcome: Progressing   Problem: Elimination: Goal: Will not experience complications related to bowel motility Outcome: Progressing Goal: Will not experience complications related to urinary retention Outcome: Progressing

## 2023-10-29 ENCOUNTER — Inpatient Hospital Stay (HOSPITAL_COMMUNITY): Payer: Medicaid Other

## 2023-10-29 DIAGNOSIS — R161 Splenomegaly, not elsewhere classified: Secondary | ICD-10-CM | POA: Diagnosis not present

## 2023-10-29 DIAGNOSIS — Z9049 Acquired absence of other specified parts of digestive tract: Secondary | ICD-10-CM | POA: Diagnosis not present

## 2023-10-29 DIAGNOSIS — J90 Pleural effusion, not elsewhere classified: Secondary | ICD-10-CM | POA: Diagnosis not present

## 2023-10-29 DIAGNOSIS — T827XXA Infection and inflammatory reaction due to other cardiac and vascular devices, implants and grafts, initial encounter: Secondary | ICD-10-CM | POA: Diagnosis not present

## 2023-10-29 DIAGNOSIS — A419 Sepsis, unspecified organism: Secondary | ICD-10-CM | POA: Diagnosis not present

## 2023-10-29 LAB — BASIC METABOLIC PANEL
Anion gap: 8 (ref 5–15)
BUN: 11 mg/dL (ref 6–20)
CO2: 28 mmol/L (ref 22–32)
Calcium: 8.6 mg/dL — ABNORMAL LOW (ref 8.9–10.3)
Chloride: 103 mmol/L (ref 98–111)
Creatinine, Ser: 0.79 mg/dL (ref 0.61–1.24)
GFR, Estimated: >60 mL/min (ref >60)
Glucose, Bld: 96 mg/dL (ref 70–99)
Potassium: 4 mmol/L (ref 3.5–5.1)
Sodium: 139 mmol/L (ref 135–145)

## 2023-10-29 LAB — CBC
HCT: 34.4 % — ABNORMAL LOW (ref 39.0–52.0)
Hemoglobin: 11.1 g/dL — ABNORMAL LOW (ref 13.0–17.0)
MCH: 28.2 pg (ref 26.0–34.0)
MCHC: 32.3 g/dL (ref 30.0–36.0)
MCV: 87.3 fL (ref 80.0–100.0)
Platelets: 237 10*3/uL (ref 150–400)
RBC: 3.94 MIL/uL — ABNORMAL LOW (ref 4.22–5.81)
RDW: 14.3 % (ref 11.5–15.5)
WBC: 6.2 10*3/uL (ref 4.0–10.5)
nRBC: 0 % (ref 0.0–0.2)

## 2023-10-29 LAB — FUNGUS CULTURE WITH STAIN

## 2023-10-29 LAB — FUNGUS CULTURE RESULT

## 2023-10-29 LAB — SARS CORONAVIRUS 2 BY RT PCR: SARS Coronavirus 2 by RT PCR: NEGATIVE

## 2023-10-29 LAB — LACTATE DEHYDROGENASE: LDH: 133 U/L (ref 98–192)

## 2023-10-29 LAB — PROTIME-INR
INR: 2.4 — ABNORMAL HIGH (ref 0.8–1.2)
Prothrombin Time: 26.1 s — ABNORMAL HIGH (ref 11.4–15.2)

## 2023-10-29 LAB — FUNGAL ORGANISM REFLEX

## 2023-10-29 MED ORDER — VANCOMYCIN HCL 1250 MG/250ML IV SOLN: 1250.0000 mg | Freq: Once | INTRAVENOUS | Status: DC

## 2023-10-29 MED ORDER — HYDROMORPHONE HCL 1 MG/ML IJ SOLN
2.0000 mg | Freq: Once | INTRAMUSCULAR | Status: AC
  Administered 2023-10-29: 2 mg via INTRAVENOUS
  Filled 2023-10-29: qty 2

## 2023-10-29 MED ORDER — DAPTOMYCIN-SODIUM CHLORIDE 500-0.9 MG/50ML-% IV SOLN
8.0000 mg/kg | Freq: Every day | INTRAVENOUS | Status: DC
  Administered 2023-10-29 – 2023-11-02 (×5): 500 mg via INTRAVENOUS
  Filled 2023-10-29 (×5): qty 50

## 2023-10-29 MED ORDER — SODIUM CHLORIDE 0.9% IV SOLUTION: Freq: Once | INTRAVENOUS | Status: AC

## 2023-10-29 MED ORDER — IOHEXOL 350 MG/ML SOLN
75.0000 mL | Freq: Once | INTRAVENOUS | Status: AC | PRN
  Administered 2023-10-29: 75 mL via INTRAVENOUS

## 2023-10-29 NOTE — Plan of Care (Signed)
  Problem: Education: Goal: Patient will understand all VAD equipment and how it functions Outcome: Progressing Goal: Patient will be able to verbalize current INR target range and antiplatelet therapy for discharge home Outcome: Progressing   Problem: Cardiac: Goal: LVAD will function as expected and patient will experience no clinical alarms Outcome: Progressing   Problem: Education: Goal: Knowledge of General Education information will improve Description: Including pain rating scale, medication(s)/side effects and non-pharmacologic comfort measures Outcome: Progressing   Problem: Health Behavior/Discharge Planning: Goal: Ability to manage health-related needs will improve Outcome: Progressing   Problem: Clinical Measurements: Goal: Ability to maintain clinical measurements within normal limits will improve Outcome: Progressing Goal: Will remain free from infection Outcome: Progressing Goal: Diagnostic test results will improve Outcome: Progressing Goal: Respiratory complications will improve Outcome: Progressing Goal: Cardiovascular complication will be avoided Outcome: Progressing   Problem: Activity: Goal: Risk for activity intolerance will decrease Outcome: Progressing   Problem: Nutrition: Goal: Adequate nutrition will be maintained Outcome: Progressing   Problem: Coping: Goal: Level of anxiety will decrease Outcome: Progressing   Problem: Elimination: Goal: Will not experience complications related to bowel motility Outcome: Progressing Goal: Will not experience complications related to urinary retention Outcome: Progressing   Problem: Pain Management: Goal: General experience of comfort will improve Outcome: Progressing   Problem: Safety: Goal: Ability to remain free from injury will improve Outcome: Progressing   Problem: Skin Integrity: Goal: Risk for impaired skin integrity will decrease Outcome: Progressing

## 2023-10-29 NOTE — Progress Notes (Addendum)
LVAD Coordinator Rounding Note:  Admitted 10/28/23 to heart failure service due to worsening driveline infection. Pt seen in VAD clinic 11/4- wound looked progressively worse. Decision was made to admit for further IV antibiotics and debridement.   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  Cultures positive for MSSA 10/24/23. ID recommending 6 weeks IV Cefazolin 2g IV q8h. End date 11/18/23.   Pt laying in bed. Reports pain at exit site. Receiving PRN Dilaudid.    Rash scabbed over on arms, neck, and back. Rash still present on abdomen but pt states this is less painful/itchy.   ID consulted. Stopped Maxipime and Vancomycin. Transitioned to Daptomycin 500 mg daily.   Plan for wound debridement and wound vac placement in OR Thursday afternoon per Dr Donata Clay. Until then VAD coordinator will perform daily dressing changes with Dr Donata Clay.   Vital signs: Temp: 97.6  HR: 90 Doppler Pressure: 86 Auto BP: 87/75 (80) O2 Sat: 95% on RA Wt: 141.1>141.5 lbs    LVAD interrogation reveals:  Speed: 5600 Flow: 4.4 Power: 4.3 w PI: 2.9  Alarms: few low voltage advisories this morning Events: 30 PI events Hematocrit: 37  Fixed speed: 5600 Low speed limit: 5300  Drive Line: Existing VAD dressing removed and site care performed using sterile technique by Dr. Maren Beach. Surrounding edge of wound bed cleansed with betadine x 2. 1 Vashe moistened 4 x 4 packed in large wound. 2 Vashe moistened 2 x 2 packed in top portion of wound. Covered with dry sterile gauze and 2 large tegaderms. Cathgrip anchor re-applied. Tenderness noted with cleansing/wound packing. Redness/rash present. Moderate amount of serosanguinous drainage with scant thick yellow/brown drainage present on previous dressing and wound bed. Continue daily wet to dry VASHE dressing changes per VAD coordinator. Next dressing change due: 10/30/23 in OR.      Labs:  LDH trend: 133  INR trend: 2.4  WBC trend:  6.2  Anticoagulation Plan: -INR Goal: 2-2.5 -ASA Dose: off  Blood Products:  10/29/23>> 2 FFP  Infection:  10/24/23>>wound cx>>STAPH AUREUS; final  Adverse Events on VAD: - Admitted 09/29/23 with DL infection. Wound cx + staph aureus. Underwent I&D/wound vac change x3. ID saw, discharged home on 6 weeks of IV cefazolin (end date 11/18/23).   Plan/Recommendations:  1. Please page VAD coordinator for any alarms or VAD equipment issues. 2. Daily dressing changes using Vashe wet/dry by VAD coordinator. 3. VAD coordinator will accompany pt to OR tomorrow for drive line debridement  Alyce Pagan RN VAD Coordinator  Office: [phone]  24/7 Pager: [phone]

## 2023-10-29 NOTE — Progress Notes (Signed)
Informed floor RN of need for Covid test prior to tomorrow's procedure. Order placed in EPIC. RN verbalized understanding of need to collect and send at this time.

## 2023-10-29 NOTE — Progress Notes (Addendum)
Advanced Heart Failure VAD Team Note  PCP-Cardiologist: None   Subjective:   Admitted with recurrent driveline infection.    Having some pain.    LVAD INTERROGATION:  HeartMate III LVAD:   Flow 4.2  liters/min, speed 5600, power 4, PI 2.9 .    Objective:    Vital Signs:   Temp:  [97.3 F (36.3 C)-97.7 F (36.5 C)] 97.6 F (36.4 C) (11/06 0711) Pulse Rate:  [80-109] 90 (11/06 0711) Resp:  [14-19] 14 (11/06 0711) BP: (87-105)/(73-79) 87/75 (11/06 0711) SpO2:  [94 %-95 %] 95 % (11/06 0711) Weight:  [64 kg-64.2 kg] 64.2 kg (11/06 0700)   Mean arterial Pressure 80s   Intake/Output:   Intake/Output Summary (Last 24 hours) at 10/29/2023 0915 Last data filed at 10/29/2023 0450 Gross per 24 hour  Intake 459.17 ml  Output --  Net 459.17 ml     Physical Exam    General:   No resp difficulty HEENT: normal Neck: supple. JVP flat . Carotids 2+ bilat; no bruits. No lymphadenopathy or thyromegaly appreciated. Cor: Mechanical heart sounds with LVAD hum present. Lungs: clear Abdomen: soft, nontender, nondistended. No hepatosplenomegaly. No bruits or masses. Good bowel sounds. Driveline: Dressing in place.  Extremities: no cyanosis, clubbing, rash, edema Neuro: alert & orientedx3, cranial nerves grossly intact. moves all 4 extremities w/o difficulty. Affect pleasant   Telemetry   ST 100s   EKG    N/A  Labs   Basic Metabolic Panel: Recent Labs  Lab 10/28/23 2135 10/29/23 0237  NA 141 139  K 3.4* 4.0  CL 103 103  CO2 27 28  GLUCOSE 94 96  BUN 11 11  CREATININE 0.76 0.79  CALCIUM 9.0 8.6*  MG 1.9  --     Liver Function Tests: Recent Labs  Lab 10/28/23 2135  AST 16  ALT 8  ALKPHOS 150*  BILITOT 0.5  PROT 7.2  ALBUMIN 3.5   No results for input(s): "LIPASE", "AMYLASE" in the last 168 hours. No results for input(s): "AMMONIA" in the last 168 hours.  CBC: Recent Labs  Lab 10/28/23 2135 10/29/23 0237  WBC 6.9 6.2  NEUTROABS 3.8  --   HGB 12.1*  11.1*  HCT 38.7* 34.4*  MCV 88.6 87.3  PLT 279 237    INR: Recent Labs  Lab 10/24/23 1524 10/28/23 2135 10/29/23 0237  INR 1.6* 2.1* 2.4*    Other results: EKG:    Imaging   No results found.   Medications:     Scheduled Medications:  sodium chloride   Intravenous Once   buprenorphine-naloxone  1 tablet Sublingual Daily   digoxin  0.125 mg Oral Daily   gabapentin  300 mg Oral TID    HYDROmorphone (DILAUDID) injection  2 mg Intravenous Once   ivabradine  5 mg Oral BID WC   losartan  25 mg Oral BID   pantoprazole  40 mg Oral Daily   sertraline  25 mg Oral Daily   spironolactone  25 mg Oral Daily   zinc sulfate  220 mg Oral Daily    Infusions:  ceFEPime (MAXIPIME) IV 2 g (10/29/23 0142)   vancomycin 1,250 mg (10/29/23 0612)    PRN Medications: acetaminophen, HYDROmorphone, hydrOXYzine, traZODone   Patient Profile  Joel York is a 37 yo male with PMH of 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 Now presenting with driveline and PICC line infection.    Assessment/Plan:   1. 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 Went for I&D/wound vac change x3. Discharged home with 6 weeks of IV abx.  - Now re-admitted with persistent driveline infection.  - Holding warfarin. INR >2.  Getting FFP per CT surgery  - Wound cultures - staph aureus  - On  Vanc and cefepime - ID consulted  - CT C/A/P ordered   2. PICC line infection - PICC pulled in clinic 10/27/23 - line holiday? Will discuss timing of PICC placement with ID - ID consulted    3. 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 Echo EF <20%,  RV mildly reduced, - Etiology uncertain. ? 2/2 PVCs vs genetically mediated +/- hypertension.  - LHC: normal coronaries.  - cMRI:  LV markedly dilated EF 10% RV 17% NICM - Underwent HM-III VAD on  07/28/25. Much improved NYHA I-II  - INR 2.4  - Volume status stable.   - Continue digoxin 0.125 mg daily - Continue losartan 25 mg BID - Continue spiro 25 mg daily - Continue Ivabradine 5 BID   4. 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 Hx drug abuse/Tobacco abuse - reports quit smoking 7/24, cotinine level at 30.1 09/22/23 - Continue suboxone and PRN dilaudid  - Follows with pain clinic   I reviewed the LVAD parameters from today, and compared the results to the patient's prior recorded data.  No programming changes were made.  The LVAD is functioning within specified parameters.  The patient performs LVAD self-test daily.  LVAD interrogation was negative for any significant power changes, alarms or PI events/speed drops.  LVAD equipment check completed and is in good working order.  Back-up equipment present.   LVAD education done on emergency procedures and precautions and reviewed exit site care.  Length of Stay: 1  Tonye Becket, NP 10/29/2023, 9:15 AM  VAD Team --- VAD ISSUES ONLY--- Pager [phone] (7am - 7am)  Advanced Heart Failure Team  Pager [phone] (M-F; 7a - 5p)  Please contact [facility] for night-coverage after hours (5p -7a ) and weekends on [email or web address]  Patient seen with NP, agree with the above note.   Admitted with recurrent driveline infection.  Wound culture from 11/1 grew MRSA.  He is currently on vancomycin/cefepime.   Main complaint today is pain at driveline exit site.   General: Well appearing this am. NAD.  HEENT: Normal. Neck: Supple, JVP 7-8 cm. Carotids OK.  Cardiac:  Mechanical heart sounds with LVAD hum present.  Lungs:  CTAB, normal effort.  Abdomen:  NT, ND, no HSM. No bruits or masses. +BS  LVAD exit site: Site is dressed.  Extremities:  Warm and dry. No cyanosis, clubbing, rash, or edema.  Neuro:  Alert & oriented x 3. Cranial nerves grossly intact. Moves all 4 extremities w/o difficulty. Affect pleasant    Recurrent driveline  infection, wound culture with MRSA.  - ID consult.  - Vancomycin/cefepime for now, will narrow after ID has seen.  - CT chest/abdomen/pelvis today to assess for abscess.  - Debridement/wound vac placement in OR on 11/7.   LVAD parameters reviewed and stable.  MAP stable.   Marca Ancona 10/29/2023 .nwo

## 2023-10-29 NOTE — Progress Notes (Signed)
PHARMACY - ANTICOAGULATION CONSULT NOTE  Pharmacy Consult for heparin and warfarin Indication: LVAD  Allergies  Allergen Reactions   Chlorhexidine Dermatitis and Rash   Other Anaphylaxis    Tree Nuts   Peanuts [Peanut Oil] Anaphylaxis    Patient Measurements: Height: 5\' 7"  (170.2 cm) Weight: 64.2 kg (141 lb 8.6 oz) IBW/kg (Calculated) : 66.1 Heparin Dosing Weight: 64 kg  Vital Signs: Temp: 97.6 F (36.4 C) (11/06 0711) Temp Source: Oral (11/06 0711) BP: 87/75 (11/06 0711) Pulse Rate: 90 (11/06 0711)  Labs: Recent Labs    10/28/23 2135 10/29/23 0237  HGB 12.1* 11.1*  HCT 38.7* 34.4*  PLT 279 237  LABPROT 23.8* 26.1*  INR 2.1* 2.4*  CREATININE 0.76 0.79    Estimated Creatinine Clearance: 115.9 mL/min (by C-G formula based on SCr of 0.79 mg/dL).   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 Patient had significant bleeding at driveline site on 09/30/23 requiring FFP. PTA Warfarin is on hold. Pharmacy consulted for heparin.   INR today increased to 2.4 - no warfarin. Last warfarin dose 11/4. Hgb 11.1, plt 237. LDH 133. No s/sx of bleeding.   Warfarin PTA: 3mg  MWF, 2mg  TTSS but INRs have been subtherapeutic.  Goal of Therapy:  Heparin level 0.3-0.5 units/ml INR 2- 2.5  Monitor platelets by anticoagulation protocol: Yes   Plan:  F/u INR on 11/7, plan for 2 units FFP, start heparin with INR < 2  Monitor daily INR, heparin level, CBC, signs/symptoms of bleeding   Thank you for allowing pharmacy to participate in this patient's care,  Sherron Monday, PharmD, BCCCP Clinical Pharmacist  Phone: [phone] 10/29/2023 9:20 AM  Please check AMION for all [facility] Pharmacy phone numbers After 10:00 PM, call Main  Pharmacy [phone]

## 2023-10-29 NOTE — Plan of Care (Signed)
  Problem: Activity: Goal: Risk for activity intolerance will decrease Outcome: Progressing   Problem: Nutrition: Goal: Adequate nutrition will be maintained Outcome: Progressing   Problem: Coping: Goal: Level of anxiety will decrease Outcome: Progressing   Problem: Elimination: Goal: Will not experience complications related to bowel motility Outcome: Progressing Goal: Will not experience complications related to urinary retention Outcome: Progressing   

## 2023-10-29 NOTE — TOC Initial Note (Signed)
Transition of Care [facility]) - Initial/Assessment Note    Patient Details  Name: Joel York MRN: [id number] Date of Birth: Dec 02, 1986  [facility]) CM/SW Contact:    Nicanor Bake Phone Number: [phone] 10/29/2023, 1:46 PM  Clinical Narrative:   HF CSW met with pt at bedside. Pt stated that he lives at home with spouse and minor children. Pt stated that he is driving.  Pt stated that he has current [facility] services. Pt stated that he has a scale. Pt stated that he does not use any equipment just things for his LVAD. Pt stated that he did not make it to previous PCP appointments due to recent hospitalizations. CSW explained that a follow up hospital appointment will scheduled closer to dc. Pt agrees and asked if he could be seen at [facility]. CSW explained that she will call to see if they accept insurance and time of intake for pts.   TOC will continue following.                Expected Discharge Plan: Home w Home Health Services Barriers to Discharge: Continued Medical Work up   Patient Goals and CMS Choice            Expected Discharge Plan and Services       Living arrangements for the past 2 months: Single Family Home                                      Prior Living Arrangements/Services Living arrangements for the past 2 months: Single Family Home Lives with:: Spouse, Minor Children Patient language and need for interpreter reviewed:: Yes Do you feel safe going back to the place where you live?: Yes      Need for Family Participation in Patient Care: Yes (Comment) Care giver support system in place?: Yes (comment)   Criminal Activity/Legal Involvement Pertinent to Current Situation/Hospitalization: No - Comment as needed  Activities of Daily Living   ADL Screening (condition at time of admission) Independently performs ADLs?: Yes (appropriate for developmental age) (Simultaneous filing. User may not have seen previous data.) Is the  patient deaf or have difficulty hearing?: No (Simultaneous filing. User may not have seen previous data.) Does the patient have difficulty seeing, even when wearing glasses/contacts?: No (Simultaneous filing. User may not have seen previous data.) Does the patient have difficulty concentrating, remembering, or making decisions?: No (Simultaneous filing. User may not have seen previous data.)  Permission Sought/Granted                  Emotional Assessment Appearance:: Appears stated age Attitude/Demeanor/Rapport: Engaged Affect (typically observed): Appropriate Orientation: : Oriented to Self, Oriented to Place, Oriented to  Time, Oriented to Situation Alcohol / Substance Use: Not Applicable Psych Involvement: No (comment)  Admission diagnosis:  Infection associated with driveline of left ventricular assist device (LVAD) (HCC) [U44.7XXA] Patient Active Problem List   Diagnosis Date Noted   MSSA (methicillin susceptible Staphylococcus aureus) infection 10/02/2023   Staphylococcus aureus infection 09/30/2023   Infection associated with driveline of left ventricular assist device (LVAD) (HCC) 09/29/2023   AKI (acute kidney injury) (HCC) 07/14/2023   Acute on chronic systolic CHF (congestive heart failure) (HCC) 07/14/2023   Elevated troponin 07/14/2023   Sinus tachycardia 07/14/2023   PVC (premature ventricular contraction) 07/14/2023   Acute decompensated heart failure (HCC) 07/14/2023   History of asthma 07/14/2023  Myocardial injury 07/14/2023   Polycythemia 07/14/2023   Chest pain 07/14/2023   Transaminitis 07/14/2023   Elevated bilirubin 07/14/2023   CHF exacerbation (HCC) 07/14/2023   ADD (attention deficit disorder) 08/29/2015   Visit for preventive health examination 02/21/2015   Mild persistent asthma 02/14/2015   Attention deficit disorder 01/24/2011   SEIZURE DISORDER 09/14/2010   ANXIETY STATE, UNSPECIFIED 11/06/2009   BICEPS TENDINITIS, LEFT 11/22/2008    ASTHMA 09/26/2008   PCP:  Oneita Hurt, No Pharmacy:   [id number] [place] Otto, [place] - [address] AT [facility] [address] & [address] [place] [address] Phone: [phone] Fax: [phone]  [facility] [address] [place] [address] Phone: [phone] Fax: [phone]     Social Determinants of Health (SDOH) Social History: SDOH Screenings   Food Insecurity: No Food Insecurity (10/28/2023)  Housing: Low Risk  (10/28/2023)  Transportation Needs: No Transportation Needs (10/28/2023)  Utilities: Not At Risk (10/28/2023)  Alcohol Screen: Low Risk  (07/14/2023)  Financial Resource Strain: Medium Risk (08/12/2023)  Physical Activity: Sufficiently Active (07/14/2023)  Stress: Stress Concern Present (07/14/2023)  Tobacco Use: High Risk (10/28/2023)   SDOH Interventions:     Readmission Risk Interventions     No data to display

## 2023-10-29 NOTE — Progress Notes (Signed)
Procedure(s) (LRB): DEBRIDEMENT OF VAD TUNNEL (N/A) POSSIBLE APPLICATION OF VERAFLO WOUND VAC (N/A) Subjective: No new complaints PICC line out- cellulitis R upper arm Receiving daptomycin for MSSA,MRSA Plan OR debridement tomorrow , getting FFP for INR rising to 2.4  Objective: Vital signs in last 24 hours: Temp:  [97.3 F (36.3 C)-97.8 F (36.6 C)] 97.7 F (36.5 C) (11/06 1345) Pulse Rate:  [78-109] 86 (11/06 1345) Cardiac Rhythm: Normal sinus rhythm;Bundle branch block (11/06 0700) Resp:  [14-19] 18 (11/06 1345) BP: (87-105)/(61-80) 104/80 (11/06 1345) SpO2:  [94 %-96 %] 95 % (11/06 1345) Weight:  [64 kg-64.2 kg] 64.2 kg (11/06 0700)  Hemodynamic parameters for last 24 hours:  Nsr, afebrile  Intake/Output from previous day: 11/05 0701 - 11/06 0700 In: 459.2 [P.O.:240; IV Piggyback:219.2] Out: -  Intake/Output this shift: Total I/O In: 1416.1 [P.O.:360; I.V.:16.5; Blood:689.2; IV Piggyback:350.4] Out: 1350 [Urine:1350]  EXAM  Neuro intact Normal VAD hum  I inspected the VAD tunnel wound and performed a sterile bedside dressing change with Vashe wet/dry gauze.  Lab Results: Recent Labs    10/28/23 2135 10/29/23 0237  WBC 6.9 6.2  HGB 12.1* 11.1*  HCT 38.7* 34.4*  PLT 279 237   BMET:  Recent Labs    10/28/23 2135 10/29/23 0237  NA 141 139  K 3.4* 4.0  CL 103 103  CO2 27 28  GLUCOSE 94 96  BUN 11 11  CREATININE 0.76 0.79  CALCIUM 9.0 8.6*    PT/INR:  Recent Labs    10/29/23 0237  LABPROT 26.1*  INR 2.4*   ABG    Component Value Date/Time   PHART 7.510 (H) 08/03/2023 0445   HCO3 27.3 08/03/2023 0445   TCO2 28 08/03/2023 0445   ACIDBASEDEF 2.0 07/31/2023 1704   O2SAT 68.1 08/08/2023 0535   CBG (last 3)  No results for input(s): "GLUCAP" in the last 72 hours.  Assessment/Plan: S/P Procedure(s) (LRB): DEBRIDEMENT OF VAD TUNNEL (N/A) POSSIBLE APPLICATION OF VERAFLO WOUND VAC (N/A) Plan surgical debridement in OR tomorrow with VAC  placement.   LOS: 1 day    Lovett Sox 10/29/2023

## 2023-10-29 NOTE — Consult Note (Signed)
[facility]                                                                                       Patient Identification: Patient Name: Joel York MRN: [id number] Admit Date: 10/28/2023  4:25 PM Today's Date: 10/29/2023 Reason for consult: lvad infection  Requesting provider: Dr Freida Busman  Principal Problem:   Infection associated with driveline of left ventricular assist device (LVAD) [facility])   Antibiotics:  Vancomycin 11/6 Cefepime 11/6  Lines/Hardware:  Assessment # Persistent driveline infection ( MSSA, MRSA) - CHF team and CVTS following, planned for debridement tomorrow  Recommendations  - Will DC cefepime prior to OR cx, no prior growth of PsA and switch Vancomycin to daptomycin to cover MSSA and MRSA pending OR/cultures  - Fu CT CAP - 2 sets of blood cx - Monitor CBC, BMP and CPK  - Following surgical plans and cultures   Rest of the management as per the primary team. Please call with questions or concerns.  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 PICC was pulled in the clinic. He was having issues with adhesives around these sites and was treated with fluconazole. Zyrtec, kenalog cream, hydroxyzine prn. Seen by CVTS Dr Maren Beach " The distal aspect of the abdominal wound is almost closed. The proximal aspect of the wound at the power cord has unhealthy granulation tissue which will be debrided "   He remains afebrile since admission. Admission Labs with no leukocytosis, k 3.4,  cr wnl  Reports he was receiving IV cefazolin without missing doses or any concerns.  He reported some pain, tenderness and erythema at the PICC line site prior to removal.   ROS: General- Denies fever, chills, loss of appetite and loss of weight HEENT - Denies headache, blurry vision, neck pain, sinus pain Chest - Denies any chest pain, SOB or cough CVS- Denies any dizziness/lightheadedness, syncopal attacks, palpitations Abdomen- Denies any nausea, vomiting, abdominal pain, hematochezia and diarrhea Neuro - Denies any weakness, numbness, tingling sensation Psych - Denies any changes in mood irritability or depressive symptoms GU- Denies any burning, dysuria, hematuria or increased frequency of urination Skin - denies any rashes/lesions MSK - denies any joint pain/swelling or restricted ROM   Past Medical History:  Diagnosis Date   Acid reflux    ADHD (attention deficit hyperactivity disorder)    Asthma    Back pain    Seizures (HCC)    Resolved   Past Surgical History:  Procedure Laterality Date   APPLICATION OF WOUND VAC N/A 10/01/2023   Procedure: APPLICATION OF WOUND VAC;  Surgeon: Lovett Sox, MD;  Location: [facility] OR;  Service: Thoracic;  Laterality: N/A;   APPLICATION OF WOUND VAC N/A 10/03/2023   Procedure: Wound Vac Change;  Surgeon: Lovett Sox, MD;  Location:  [facility] OR;  Service: Thoracic;  Laterality: N/A;   APPLICATION OF WOUND VAC N/A 10/07/2023   Procedure: WOUND VAC CHANGE;  Surgeon: Lovett Sox, MD;  Location: [facility] OR;  Service: Thoracic;  Laterality: N/A;   CHOLECYSTECTOMY     INSERTION OF IMPLANTABLE LEFT VENTRICULAR ASSIST DEVICE N/A 07/30/2023   Procedure: INSERTION OF IMPLANTABLE LEFT VENTRICULAR ASSIST DEVICE;  Surgeon: Lovett Sox, MD;  Location: [facility] OR;  Service: Open Heart Surgery;  Laterality: N/A;   IR THORACENTESIS ASP PLEURAL SPACE W/IMG GUIDE  08/08/2023   RIGHT HEART CATH N/A 07/28/2023   Procedure: RIGHT HEART CATH;  Surgeon: Dolores Patty,  MD;  Location: [facility] INVASIVE CV LAB;  Service: Cardiovascular;  Laterality: N/A;   RIGHT/LEFT HEART CATH AND CORONARY ANGIOGRAPHY N/A 07/16/2023   Procedure: RIGHT/LEFT HEART CATH AND CORONARY ANGIOGRAPHY;  Surgeon: Dolores Patty, MD;  Location: [facility] INVASIVE CV LAB;  Service: Cardiovascular;  Laterality: N/A;   STERNAL WOUND DEBRIDEMENT N/A 10/01/2023   Procedure: VAD TUNNEL DEBRIDEMENT;  Surgeon: Lovett Sox, MD;  Location: [facility] OR;  Service: Thoracic;  Laterality: N/A;   STERNAL WOUND DEBRIDEMENT N/A 10/03/2023   Procedure: Wound Irrigation;  Surgeon: Lovett Sox, MD;  Location: [facility] OR;  Service: Thoracic;  Laterality: N/A;   STERNAL WOUND DEBRIDEMENT N/A 10/07/2023   Procedure: VAD TUNNEL DEBRIDEMENT USING MYRIAD MORCELLS FINE;  Surgeon: Lovett Sox, MD;  Location: [facility] OR;  Service: Thoracic;  Laterality: N/A;   TEE WITHOUT CARDIOVERSION N/A 07/30/2023   Procedure: TRANSESOPHAGEAL ECHOCARDIOGRAM;  Surgeon: Lovett Sox, MD;  Location: [facility] OR;  Service: Open Heart Surgery;  Laterality: N/A;   TOOTH EXTRACTION N/A 07/23/2023   Procedure: DENTAL RESTORATION/EXTRACTIONS;  Surgeon: Ocie Doyne, DMD;  Location: [facility] OR;  Service: Oral Surgery;  Laterality: N/A;   WISDOM TOOTH EXTRACTION     Scheduled Meds:  buprenorphine-naloxone  1 tablet Sublingual Daily   digoxin  0.125 mg Oral Daily   gabapentin  300 mg Oral TID    HYDROmorphone (DILAUDID) injection  2 mg Intravenous Once   ivabradine  5 mg Oral BID WC   losartan  25 mg Oral BID   pantoprazole  40 mg Oral Daily   sertraline  25 mg Oral Daily   spironolactone  25 mg Oral Daily   zinc sulfate  220 mg Oral Daily   Continuous Infusions:  ceFEPime (MAXIPIME) IV 2 g (10/29/23 0142)   vancomycin     PRN Meds:.acetaminophen, HYDROmorphone, hydrOXYzine, traZODone  Allergies  Allergen Reactions   Chlorhexidine Dermatitis and Rash   Other Anaphylaxis    Tree Nuts   Peanuts [Peanut Oil] Anaphylaxis   Social History    Socioeconomic History   Marital status: Married    Spouse name: Not on file   Number of children: Not on file   Years of education: Not on file   Highest education level: Not on file  Occupational History   Not on file  Tobacco Use   Smoking status: Every Day    Types: Cigarettes   Smokeless tobacco: Current    Types: Chew  Vaping Use   Vaping status: Never Used  Substance and Sexual Activity   Alcohol use: Not Currently   Drug use: Not Currently    Comment: last use "many years ago"   Sexual activity: Yes  Other Topics Concern   Not on file  Social History Narrative   Not on file   Social Determinants of Health   Financial Resource Strain: Medium Risk (08/12/2023)  Overall Financial Resource Strain (CARDIA)    Difficulty of Paying Living Expenses: Somewhat hard  Food Insecurity: No Food Insecurity (10/28/2023)   Hunger Vital Sign    Worried About Running Out of Food in the Last Year: Never true    Ran Out of Food in the Last Year: Never true  Transportation Needs: No Transportation Needs (10/28/2023)   PRAPARE - Administrator, Civil Service (Medical): No    Lack of Transportation (Non-Medical): No  Physical Activity: Sufficiently Active (07/14/2023)   Exercise Vital Sign    Days of Exercise per Week: 3 days    Minutes of Exercise per Session: 60 min  Stress: Stress Concern Present (07/14/2023)   [employer] of Occupational Health - Occupational Stress Questionnaire    Feeling of Stress : To some extent  Social Connections: Not on file  Intimate Partner Violence: Not At Risk (10/28/2023)   Humiliation, Afraid, Rape, and Kick questionnaire    Fear of Current or Ex-Partner: No    Emotionally Abused: No    Physically Abused: No    Sexually Abused: No   Family History  Problem Relation Age of Onset   Crohn's disease Maternal Grandmother    Crohn's disease Maternal Uncle    Hypertension Mother        Living   Congestive Heart Failure Maternal  Grandmother    Hypertension Other        Maternal Aunts & Uncles   Vitals BP (!) 87/73 (BP Location: Right Arm)   Pulse 80   Temp 97.7 F (36.5 C) (Oral)   Resp 19   Ht 5\' 7"  (1.702 m)   Wt 64 kg   SpO2 95%   BMI 22.10 kg/m    Physical Exam Constitutional: Adult male lying in the bed and just waking up from sleeping, nontoxic-appearing    Comments: HEENT WNL  Cardiovascular:     Rate and Rhythm: Mechanical heart sounds    Heart sounds: LVAD hum  Pulmonary:     Effort: Pulmonary effort is normal.     Comments: Normal breath sounds  Abdominal:     Palpations: Abdomen is soft.     Tenderness: Nondistended, bandages over LVAD site C/D/I  Musculoskeletal:        General: No swelling or tenderness in peripheral joints  Skin:    Comments: No rashes, right arm PICC line removal site with faint resolving erythema, with no tenderness, fluctuance or crepitus or swelling.  Unlikely DVT  Neurological:     General: Awake, alert and oriented, following commands  Psychiatric:        Mood and Affect: Mood normal.    Pertinent Microbiology Results for orders placed or performed during the hospital encounter of 10/24/23  Aerobic Culture w Gram Stain (superficial specimen)     Status: None   Collection Time: 10/24/23  3:45 PM   Specimen: Abdomen; Wound  Result Value Ref Range Status   Specimen Description ABDOMEN  Final   Special Requests NONE  Final   Gram Stain   Final    ABUNDANT WBC PRESENT, PREDOMINANTLY PMN NO ORGANISMS SEEN Performed at [facility], [address], [place], [place] [address]    Culture FEW STAPHYLOCOCCUS AUREUS  Final   Report Status 10/27/2023 FINAL  Final   Organism ID, Bacteria STAPHYLOCOCCUS AUREUS  Final      Susceptibility   Staphylococcus aureus - MIC*    CIPROFLOXACIN <=0.5 SENSITIVE Sensitive     ERYTHROMYCIN >=8  RESISTANT Resistant     GENTAMICIN <=0.5 SENSITIVE Sensitive     OXACILLIN RESISTANT Resistant     TETRACYCLINE <=1  SENSITIVE Sensitive     VANCOMYCIN 1 SENSITIVE Sensitive     TRIMETH/SULFA <=10 SENSITIVE Sensitive     CLINDAMYCIN <=0.25 SENSITIVE Sensitive     RIFAMPIN <=0.5 SENSITIVE Sensitive     Inducible Clindamycin NEGATIVE Sensitive     LINEZOLID 2 SENSITIVE Sensitive     * FEW STAPHYLOCOCCUS AUREUS   Pertinent Lab seen by me:    Latest Ref Rng & Units 10/29/2023    2:37 AM 10/28/2023    9:35 PM 10/16/2023    4:43 AM  CBC  WBC 4.0 - 10.5 K/uL 6.2  6.9  6.6   Hemoglobin 13.0 - 17.0 g/dL 40.9  81.1  91.4   Hematocrit 39.0 - 52.0 % 34.4  38.7  39.2   Platelets 150 - 400 K/uL 237  279  287       Latest Ref Rng & Units 10/29/2023    2:37 AM 10/28/2023    9:35 PM 10/16/2023    4:43 AM  CMP  Glucose 70 - 99 mg/dL 96  94  88   BUN 6 - 20 mg/dL 11  11  17    Creatinine 0.61 - 1.24 mg/dL 7.82  9.56  2.13   Sodium 135 - 145 mmol/L 139  141  139   Potassium 3.5 - 5.1 mmol/L 4.0  3.4  4.1   Chloride 98 - 111 mmol/L 103  103  104   CO2 22 - 32 mmol/L 28  27  28    Calcium 8.9 - 10.3 mg/dL 8.6  9.0  9.0   Total Protein 6.5 - 8.1 g/dL  7.2    Total Bilirubin <1.2 mg/dL  0.5    Alkaline Phos 38 - 126 U/L  150    AST 15 - 41 U/L  16    ALT 0 - 44 U/L  8       Pertinent Imagings/Other Imagings Plain films and CT images have been personally visualized and interpreted; radiology reports have been reviewed. Decision making incorporated into the Impression / Recommendations.  IR PICC PLACEMENT RIGHT >5 YRS INC IMG GUIDE  Result Date: 10/24/2023 INDICATION: 37 year old male with malpositioned peripherally inserted central catheter. Request made for exchange. EXAM: FLUOROSCOPIC GUIDED PICC LINE EXCHANGE MEDICATIONS: None. CONTRAST:  None ANESTHESIA/SEDATION: None FLUOROSCOPY TIME:  Radiation Exposure Index (as provided by the fluoroscopic device): 0.2 mGy Kerma COMPLICATIONS: None immediate. TECHNIQUE: The procedure, risks, benefits, and alternatives were explained to the patient and informed written  consent was obtained. The right upper extremity and external portion of the existing PICC line was prepped with chlorhexidine in a sterile fashion, and a sterile drape was applied covering the operative field. Maximum barrier sterile technique with sterile gowns and gloves were used for the procedure. A timeout was performed prior to the initiation of the procedure. Local anesthesia was provided with 1% lidocaine. The existing PICC line was cannulated with an 0.0018 wire which was advanced through the catheter. The catheter was exchanged for a peel-away sheath, ultimately allowing advancement of a 39 -cm, 5 - [place], single lumen PICC line to the level of the superior caval atrial junction. A post procedure spot fluoroscopic image was obtained. The catheter easily aspirated and flushed and was sutured in place. A dressing was placed. The patient tolerated the procedure well without immediate post procedural complication. FINDINGS: After catheter exchange, the  tip lies within the superior cavoatrial junction. The catheter aspirates and flushes normally and is ready for immediate use. IMPRESSION: Successful fluoroscopic guided exchange of right upper extremity approach 39 cm, 5 - [place], single lumen PICC with tip overlying the superior caval atrial junction. The PICC line is ready for immediate use. Performed by: Loyce Dys PA-C Electronically Signed   By: Gilmer Mor D.O.   On: 10/24/2023 15:57   [place] EKG SITE RITE  Result Date: 10/14/2023 If Site Rite image not attached, placement could not be confirmed due to current cardiac rhythm.  [place] EKG SITE RITE  Result Date: 10/03/2023 If Site Rite image not attached, placement could not be confirmed due to current cardiac rhythm.  ECHOCARDIOGRAM LIMITED  Result Date: 09/30/2023    ECHOCARDIOGRAM LIMITED REPORT   Patient Name:   ISAYAH IGNASIAK Date of Exam: 09/30/2023 Medical Rec #:  [id number]    Height:       67.0 in Accession #:    [id number]   Weight:        143.3 lb Date of Birth:  1986-10-02    BSA:          1.755 m Patient Age:    37 years     BP:           111/91 mmHg Patient Gender: M            HR:           74 bpm. Exam Location:  Inpatient Procedure: Limited Echo, Color Doppler and Cardiac Doppler Indications:    LVAD  History:        Patient has prior history of Echocardiogram examinations, most                 recent 08/06/2023. LVAD and CHF; Acute MI.  Sonographer:    Milbert Coulter Referring Phys: [id number] PETER VANTRIGT IMPRESSIONS  1. Heartmate III     At 5600 RPM, septum is midline, aortic valves opens every other beat, no aortic regurgitation, no mitral regurgitation. Inflow cannula visualized no apparent clot. Left ventricular ejection fraction, by estimation, is <20%. The left ventricle has severely decreased function.  2. Right ventricular systolic function is moderately reduced.  3. The inferior vena cava is normal in size with <50% respiratory variability, suggesting right atrial pressure of 8 mmHg. FINDINGS  Left Ventricle: Heartmate III At 5600 RPM, septum is midline, aortic valves opens every other beat, no aortic regurgitation, no mitral regurgitation. Inflow cannula visualized no apparent clot. Left ventricular ejection fraction, by estimation, is <20%. The left ventricle has severely decreased function. Right Ventricle: Right ventricular systolic function is moderately reduced. Tricuspid Valve: Tricuspid valve regurgitation is mild. Venous: The inferior vena cava is normal in size with less than 50% respiratory variability, suggesting right atrial pressure of 8 mmHg. Additional Comments: Spectral Doppler performed. Color Doppler performed.  Thomasene Ripple DO Electronically signed by Thomasene Ripple DO Signature Date/Time: 09/30/2023/5:38:43 PM    Final    DG Chest Port 1 View  Result Date: 09/30/2023 CLINICAL DATA:  [id number] LVAD (left ventricular assist device) present [facility]) [id number] EXAM: PORTABLE CHEST 1 VIEW COMPARISON:  08/21/2023 FINDINGS: Previous  median sternotomy and LVAD noted. Stable cardiomegaly. Lungs remain clear. No CHF pattern. No large effusion or pneumothorax. Trachea midline. Overall stable exam. No osseous abnormality. IMPRESSION: Stable chest exam. No interval change. Electronically Signed   By: Judie Petit.  Shick M.D.   On: 09/30/2023 13:32   CT CHEST ABDOMEN PELVIS W CONTRAST  Result Date: 09/30/2023 CLINICAL DATA:  Suspected LVAD drive line infection EXAM: CT CHEST, ABDOMEN, AND PELVIS WITH CONTRAST TECHNIQUE: Multidetector CT imaging of the chest, abdomen and pelvis was performed following the standard protocol during bolus administration of intravenous contrast. RADIATION DOSE REDUCTION: This exam was performed according to the departmental dose-optimization program which includes automated exposure control, adjustment of the mA and/or kV according to patient size and/or use of iterative reconstruction technique. CONTRAST:  75mL OMNIPAQUE IOHEXOL 350 MG/ML SOLN COMPARISON:  CT chest abdomen pelvis, 07/18/2023 FINDINGS: CT CHEST FINDINGS Cardiovascular: Interval placement of LVAD. Outflow cannula appears patent. No pericardial effusion. Mediastinum/Nodes: No enlarged mediastinal, hilar, or axillary lymph nodes. Postoperative soft tissue stranding in the anterior mediastinum (series 3, image 24). Thyroid gland, trachea, and esophagus demonstrate no significant findings. Lungs/Pleura: Diffuse bilateral bronchial wall thickening. Bibasilar scarring or atelectasis. No pleural effusion or pneumothorax. Musculoskeletal: Status post interval median sternotomy. No acute osseous findings. CT ABDOMEN PELVIS FINDINGS Hepatobiliary: No focal liver abnormality is seen. Status post cholecystectomy. No biliary dilatation. Pancreas: Unremarkable. No pancreatic ductal dilatation or surrounding inflammatory changes. Spleen: Splenomegaly, maximum coronal span 14.8 cm Adrenals/Urinary Tract: Adrenal glands are unremarkable. Kidneys are normal, without renal calculi,  solid lesion, or hydronephrosis. Bladder is unremarkable. Stomach/Bowel: Stomach is within normal limits. Appendix appears normal. No evidence of bowel wall thickening, distention, or inflammatory changes. Vascular/Lymphatic: No significant vascular findings are present. No enlarged abdominal or pelvic lymph nodes. Reproductive: No mass or other abnormality. Other: No abdominal wall hernia. Elongated rim enhancing fluid collection at the inferior aspect of the LVAD driveline in the superficial soft tissues of the left hemiabdomen measuring 5.0 x 1.6 x 2.0 cm (series 3, image 79, series 6, image 101). No ascites. Musculoskeletal: No acute osseous findings. IMPRESSION: 1. Interval placement of LVAD. Outflow cannula appears patent. 2. Elongated rim enhancing fluid collection at the inferior aspect of the LVAD driveline in the superficial soft tissues of the left hemiabdomen measuring 5.0 x 1.6 x 2.0 cm, concerning for drive line infection. Note however that the presence or absence of infection within this fluid is not established by imaging. 3. Diffuse bilateral bronchial wall thickening, consistent with nonspecific infectious or inflammatory bronchitis. 4. Splenomegaly. Electronically Signed   By: Jearld Lesch M.D.   On: 09/30/2023 12:07     I have personally spent 83 minutes involved in face-to-face and non-face-to-face activities for this patient on the day of the visit. Professional time spent includes the following activities: Preparing to see the patient (review of tests), Obtaining and/or reviewing separately obtained history (admission/discharge record), Performing a medically appropriate examination and/or evaluation , Ordering medications/tests/procedures, referring and communicating with other health care professionals, Documenting clinical information in the EMR, Independently interpreting results (not separately reported), Communicating results to the patient/family/caregiver, Counseling and educating  the patient/family/caregiver and Care coordination (not separately reported).  Electronically signed by:   Plan d/w requesting provider as well as ID pharm D  Of note, portions of this note may have been created with voice recognition software. While this note has been edited for accuracy, occasional wrong-word or 'sound-a-like' substitutions may have occurred due to the inherent limitations of voice recognition software.   Odette Fraction, MD Infectious Disease Physician [facility] Pager: [phone]

## 2023-10-30 ENCOUNTER — Encounter (HOSPITAL_COMMUNITY)
Admission: AD | Disposition: A | Discharge status: Home / Self Care | Payer: Self-pay | Source: Home / Self Care | Attending: Cardiology

## 2023-10-30 ENCOUNTER — Inpatient Hospital Stay (HOSPITAL_COMMUNITY): Payer: Medicaid Other | Admitting: Anesthesiology

## 2023-10-30 ENCOUNTER — Encounter (HOSPITAL_COMMUNITY): Payer: Self-pay | Admitting: Cardiology

## 2023-10-30 ENCOUNTER — Inpatient Hospital Stay (HOSPITAL_COMMUNITY): Admission: RE | Admit: 2023-10-30 | Payer: Medicaid Other | Source: Home / Self Care | Admitting: Cardiothoracic Surgery

## 2023-10-30 DIAGNOSIS — I110 Hypertensive heart disease with heart failure: Secondary | ICD-10-CM | POA: Diagnosis not present

## 2023-10-30 DIAGNOSIS — T827XXA Infection and inflammatory reaction due to other cardiac and vascular devices, implants and grafts, initial encounter: Secondary | ICD-10-CM

## 2023-10-30 DIAGNOSIS — I5023 Acute on chronic systolic (congestive) heart failure: Secondary | ICD-10-CM | POA: Diagnosis not present

## 2023-10-30 DIAGNOSIS — I509 Heart failure, unspecified: Secondary | ICD-10-CM

## 2023-10-30 DIAGNOSIS — T80212A Local infection due to central venous catheter, initial encounter: Secondary | ICD-10-CM | POA: Diagnosis not present

## 2023-10-30 HISTORY — PX: APPLICATION OF WOUND VAC: SHX5189

## 2023-10-30 HISTORY — PX: INCISION AND DRAINAGE OF WOUND: SHX1803

## 2023-10-30 LAB — CBC
HCT: 35.4 % — ABNORMAL LOW (ref 39.0–52.0)
HCT: 35.3 % — ABNORMAL LOW (ref 39.0–52.0)
Hemoglobin: 11.4 g/dL — ABNORMAL LOW (ref 13.0–17.0)
Hemoglobin: 11.3 g/dL — ABNORMAL LOW (ref 13.0–17.0)
MCH: 27.7 pg (ref 26.0–34.0)
MCH: 27.6 pg (ref 26.0–34.0)
MCHC: 32.2 g/dL (ref 30.0–36.0)
MCHC: 32 g/dL (ref 30.0–36.0)
MCV: 86.1 fL (ref 80.0–100.0)
MCV: 86.1 fL (ref 80.0–100.0)
Platelets: 252 10*3/uL (ref 150–400)
Platelets: 265 10*3/uL (ref 150–400)
RBC: 4.11 MIL/uL — ABNORMAL LOW (ref 4.22–5.81)
RBC: 4.1 MIL/uL — ABNORMAL LOW (ref 4.22–5.81)
RDW: 14.1 % (ref 11.5–15.5)
RDW: 14 % (ref 11.5–15.5)
WBC: 5.6 10*3/uL (ref 4.0–10.5)
WBC: 7.2 10*3/uL (ref 4.0–10.5)
nRBC: 0 % (ref 0.0–0.2)
nRBC: 0 % (ref 0.0–0.2)

## 2023-10-30 LAB — PREPARE FRESH FROZEN PLASMA
Unit division: 0
Unit division: 0

## 2023-10-30 LAB — BPAM FFP
Blood Product Expiration Date: 202411112359
Blood Product Expiration Date: 202411112359
ISSUE DATE / TIME: 202411061025
ISSUE DATE / TIME: 202411061200
Unit Type and Rh: 8400
Unit Type and Rh: 8400

## 2023-10-30 LAB — TYPE AND SCREEN
ABO/RH(D): AB POS
Antibody Screen: NEGATIVE

## 2023-10-30 LAB — BASIC METABOLIC PANEL
Anion gap: 10 (ref 5–15)
Anion gap: 8 (ref 5–15)
BUN: 10 mg/dL (ref 6–20)
BUN: 11 mg/dL (ref 6–20)
CO2: 26 mmol/L (ref 22–32)
CO2: 27 mmol/L (ref 22–32)
Calcium: 8.8 mg/dL — ABNORMAL LOW (ref 8.9–10.3)
Calcium: 8.9 mg/dL (ref 8.9–10.3)
Chloride: 102 mmol/L (ref 98–111)
Chloride: 102 mmol/L (ref 98–111)
Creatinine, Ser: 0.66 mg/dL (ref 0.61–1.24)
Creatinine, Ser: 0.83 mg/dL (ref 0.61–1.24)
GFR, Estimated: >60 mL/min (ref >60)
GFR, Estimated: >60 mL/min (ref >60)
Glucose, Bld: 127 mg/dL — ABNORMAL HIGH (ref 70–99)
Glucose, Bld: 154 mg/dL — ABNORMAL HIGH (ref 70–99)
Potassium: 3.8 mmol/L (ref 3.5–5.1)
Potassium: 3.8 mmol/L (ref 3.5–5.1)
Sodium: 138 mmol/L (ref 135–145)
Sodium: 137 mmol/L (ref 135–145)

## 2023-10-30 LAB — PROTIME-INR
INR: 1.7 — ABNORMAL HIGH (ref 0.8–1.2)
Prothrombin Time: 20 s — ABNORMAL HIGH (ref 11.4–15.2)

## 2023-10-30 LAB — LACTATE DEHYDROGENASE: LDH: 134 U/L (ref 98–192)

## 2023-10-30 LAB — SURGICAL PCR SCREEN
MRSA, PCR: NEGATIVE
Staphylococcus aureus: POSITIVE — AB

## 2023-10-30 SURGERY — IRRIGATION AND DEBRIDEMENT WOUND
Anesthesia: General | Site: Chest

## 2023-10-30 MED ORDER — OXYCODONE HCL 5 MG PO TABS: 5.0000 mg | ORAL_TABLET | Freq: Once | ORAL | Status: DC | PRN

## 2023-10-30 MED ORDER — PROPOFOL 10 MG/ML IV BOLUS
INTRAVENOUS | Status: DC | PRN
  Administered 2023-10-30: 100 mg via INTRAVENOUS

## 2023-10-30 MED ORDER — ROCURONIUM BROMIDE 10 MG/ML (PF) SYRINGE
PREFILLED_SYRINGE | INTRAVENOUS | Status: AC
  Filled 2023-10-30: qty 10

## 2023-10-30 MED ORDER — LIDOCAINE 2% (20 MG/ML) 5 ML SYRINGE
INTRAMUSCULAR | Status: DC | PRN
  Administered 2023-10-30: 40 mg via INTRAVENOUS

## 2023-10-30 MED ORDER — CHLORHEXIDINE GLUCONATE 0.12 % MT SOLN: 15.0000 mL | Freq: Once | OROMUCOSAL | Status: AC

## 2023-10-30 MED ORDER — ONDANSETRON HCL 4 MG/2ML IJ SOLN
INTRAMUSCULAR | Status: DC | PRN
  Administered 2023-10-30: 4 mg via INTRAVENOUS

## 2023-10-30 MED ORDER — HYDROMORPHONE HCL 1 MG/ML IJ SOLN
2.0000 mg | INTRAMUSCULAR | Status: DC | PRN
  Administered 2023-10-30 – 2023-10-31 (×5): 2 mg via INTRAVENOUS
  Filled 2023-10-30 (×5): qty 2

## 2023-10-30 MED ORDER — FENTANYL CITRATE (PF) 100 MCG/2ML IJ SOLN
25.0000 ug | INTRAMUSCULAR | Status: DC | PRN
  Administered 2023-10-30 (×3): 50 ug via INTRAVENOUS

## 2023-10-30 MED ORDER — KETOROLAC TROMETHAMINE 30 MG/ML IJ SOLN
30.0000 mg | Freq: Four times a day (QID) | INTRAMUSCULAR | Status: AC | PRN
Start: 2023-10-30 — End: 2023-11-01
  Administered 2023-10-30: 30 mg via INTRAVENOUS
  Filled 2023-10-30: qty 1

## 2023-10-30 MED ORDER — FENTANYL CITRATE (PF) 250 MCG/5ML IJ SOLN
INTRAMUSCULAR | Status: AC
  Filled 2023-10-30: qty 5

## 2023-10-30 MED ORDER — SUGAMMADEX SODIUM 200 MG/2ML IV SOLN
INTRAVENOUS | Status: DC | PRN
  Administered 2023-10-30: 150 mg via INTRAVENOUS

## 2023-10-30 MED ORDER — FENTANYL CITRATE (PF) 100 MCG/2ML IJ SOLN
INTRAMUSCULAR | Status: AC
  Filled 2023-10-30: qty 2

## 2023-10-30 MED ORDER — MIDAZOLAM HCL 2 MG/2ML IJ SOLN
INTRAMUSCULAR | Status: DC | PRN
  Administered 2023-10-30: 2 mg via INTRAVENOUS

## 2023-10-30 MED ORDER — FENTANYL CITRATE (PF) 250 MCG/5ML IJ SOLN
INTRAMUSCULAR | Status: DC | PRN
  Administered 2023-10-30 (×2): 50 ug via INTRAVENOUS
  Administered 2023-10-30: 25 ug via INTRAVENOUS

## 2023-10-30 MED ORDER — CHLORHEXIDINE GLUCONATE 0.12 % MT SOLN
OROMUCOSAL | Status: AC
  Administered 2023-10-30: 15 mL via OROMUCOSAL
  Filled 2023-10-30: qty 15

## 2023-10-30 MED ORDER — ORAL CARE MOUTH RINSE: 15.0000 mL | Freq: Once | OROMUCOSAL | Status: AC

## 2023-10-30 MED ORDER — OXYCODONE HCL 5 MG/5ML PO SOLN: 5.0000 mg | Freq: Once | ORAL | Status: DC | PRN

## 2023-10-30 MED ORDER — MAGNESIUM SULFATE 2 GM/50ML IV SOLN
2.0000 g | Freq: Once | INTRAVENOUS | Status: AC
  Administered 2023-10-30: 2 g via INTRAVENOUS
  Filled 2023-10-30: qty 50

## 2023-10-30 MED ORDER — HYDROMORPHONE HCL 1 MG/ML IJ SOLN
INTRAMUSCULAR | Status: AC
  Filled 2023-10-30: qty 1

## 2023-10-30 MED ORDER — MUPIROCIN 2 % EX OINT
1.0000 | TOPICAL_OINTMENT | Freq: Two times a day (BID) | CUTANEOUS | Status: AC
  Administered 2023-10-30 – 2023-11-04 (×9): 1 via NASAL
  Filled 2023-10-30 (×3): qty 22

## 2023-10-30 MED ORDER — PHENYLEPHRINE 80 MCG/ML (10ML) SYRINGE FOR IV PUSH (FOR BLOOD PRESSURE SUPPORT)
PREFILLED_SYRINGE | INTRAVENOUS | Status: DC | PRN
  Administered 2023-10-30: 80 ug via INTRAVENOUS
  Administered 2023-10-30: 160 ug via INTRAVENOUS
  Administered 2023-10-30 (×2): 80 ug via INTRAVENOUS

## 2023-10-30 MED ORDER — PHENYLEPHRINE 80 MCG/ML (10ML) SYRINGE FOR IV PUSH (FOR BLOOD PRESSURE SUPPORT)
PREFILLED_SYRINGE | INTRAVENOUS | Status: AC
  Filled 2023-10-30: qty 10

## 2023-10-30 MED ORDER — ONDANSETRON HCL 4 MG/2ML IJ SOLN
INTRAMUSCULAR | Status: AC
  Filled 2023-10-30: qty 2

## 2023-10-30 MED ORDER — ONDANSETRON HCL 4 MG/2ML IJ SOLN: 4.0000 mg | Freq: Four times a day (QID) | INTRAMUSCULAR | Status: DC | PRN

## 2023-10-30 MED ORDER — PHENYLEPHRINE HCL-NACL 20-0.9 MG/250ML-% IV SOLN
INTRAVENOUS | Status: DC | PRN
  Administered 2023-10-30: 50 ug/min via INTRAVENOUS

## 2023-10-30 MED ORDER — ROCURONIUM BROMIDE 10 MG/ML (PF) SYRINGE
PREFILLED_SYRINGE | INTRAVENOUS | Status: DC | PRN
  Administered 2023-10-30: 50 mg via INTRAVENOUS

## 2023-10-30 MED ORDER — SODIUM CHLORIDE 0.9 % IR SOLN
Status: DC | PRN
  Administered 2023-10-30: 1000 mL

## 2023-10-30 MED ORDER — ALBUMIN HUMAN 5 % IV SOLN: INTRAVENOUS | Status: DC | PRN

## 2023-10-30 MED ORDER — PROPOFOL 10 MG/ML IV BOLUS
INTRAVENOUS | Status: AC
  Filled 2023-10-30: qty 20

## 2023-10-30 MED ORDER — MIDAZOLAM HCL 2 MG/2ML IJ SOLN
INTRAMUSCULAR | Status: AC
  Filled 2023-10-30: qty 2

## 2023-10-30 MED ORDER — LACTATED RINGERS IV SOLN
INTRAVENOUS | Status: DC
Start: 2023-10-30 — End: 2023-10-30

## 2023-10-30 MED ORDER — HYDROMORPHONE HCL 1 MG/ML IJ SOLN
0.5000 mg | INTRAMUSCULAR | Status: AC
  Administered 2023-10-30: 0.5 mg via INTRAVENOUS

## 2023-10-30 MED ORDER — LIDOCAINE 2% (20 MG/ML) 5 ML SYRINGE
INTRAMUSCULAR | Status: AC
  Filled 2023-10-30: qty 5

## 2023-10-30 MED ORDER — DEXAMETHASONE SODIUM PHOSPHATE 10 MG/ML IJ SOLN
INTRAMUSCULAR | Status: DC | PRN
  Administered 2023-10-30: 10 mg via INTRAVENOUS

## 2023-10-30 MED ORDER — DEXAMETHASONE SODIUM PHOSPHATE 10 MG/ML IJ SOLN
INTRAMUSCULAR | Status: AC
  Filled 2023-10-30: qty 1

## 2023-10-30 MED ORDER — DEXTROSE IN LACTATED RINGERS 5 % IV SOLN: INTRAVENOUS | Status: AC

## 2023-10-30 SURGICAL SUPPLY — 61 items
APL SKNCLS STERI-STRIP NONHPOA (GAUZE/BANDAGES/DRESSINGS) ×4
BENZOIN TINCTURE PRP APPL 2/3 (GAUZE/BANDAGES/DRESSINGS) IMPLANT
BLADE CLIPPER SURG (BLADE) ×2 IMPLANT
BLADE SURG 10 STRL SS (BLADE) IMPLANT
BLADE SURG 15 STRL LF DISP TIS (BLADE) IMPLANT
BLADE SURG 15 STRL SS (BLADE)
BNDG GAUZE DERMACEA FLUFF 4 (GAUZE/BANDAGES/DRESSINGS) IMPLANT
BNDG GZE DERMACEA 4 6PLY (GAUZE/BANDAGES/DRESSINGS)
CANISTER SUCT 3000ML PPV (MISCELLANEOUS) ×2 IMPLANT
CANISTER WOUND CARE 500ML ATS (WOUND CARE) ×2 IMPLANT
CANISTER WOUNDNEG PRESSURE 500 (CANNISTER) IMPLANT
CLIP TI WIDE RED SMALL 24 (CLIP) IMPLANT
CNTNR URN SCR LID CUP LEK RST (MISCELLANEOUS) IMPLANT
CONT SPEC 4OZ STRL OR WHT (MISCELLANEOUS)
CONTAINER PROTECT SURGISLUSH (MISCELLANEOUS) ×4 IMPLANT
DRAPE DERMATAC (DRAPES) IMPLANT
DRAPE LAPAROSCOPIC ABDOMINAL (DRAPES) ×2 IMPLANT
DRAPE SLUSH/WARMER DISC (DRAPES) IMPLANT
DRSG CUTIMED SORBACT 7X9 (GAUZE/BANDAGES/DRESSINGS) IMPLANT
DRSG VAC GRANUFOAM LG (GAUZE/BANDAGES/DRESSINGS) ×2 IMPLANT
DRSG VAC GRANUFOAM MED (GAUZE/BANDAGES/DRESSINGS) ×2 IMPLANT
DRSG VAC GRANUFOAM SM (GAUZE/BANDAGES/DRESSINGS) ×2 IMPLANT
ELECT REM PT RETURN 9FT ADLT (ELECTROSURGICAL) ×2
ELECTRODE REM PT RTRN 9FT ADLT (ELECTROSURGICAL) ×2 IMPLANT
GAUZE 4X4 16PLY ~~LOC~~+RFID DBL (SPONGE) ×2 IMPLANT
GAUZE PAD ABD 8X10 STRL (GAUZE/BANDAGES/DRESSINGS) IMPLANT
GAUZE SPONGE 4X4 12PLY STRL (GAUZE/BANDAGES/DRESSINGS) IMPLANT
GAUZE XEROFORM 5X9 LF (GAUZE/BANDAGES/DRESSINGS) IMPLANT
GLOVE BIO SURGEON STRL SZ 6 (GLOVE) IMPLANT
GLOVE BIO SURGEON STRL SZ7.5 (GLOVE) ×4 IMPLANT
GLOVE BIOGEL PI IND STRL 6.5 (GLOVE) IMPLANT
GOWN STRL REUS W/ TWL LRG LVL3 (GOWN DISPOSABLE) ×4 IMPLANT
GOWN STRL REUS W/TWL LRG LVL3 (GOWN DISPOSABLE) ×4
HANDPIECE INTERPULSE COAX TIP (DISPOSABLE) ×2
HEMOSTAT POWDER SURGIFOAM 1G (HEMOSTASIS) IMPLANT
HEMOSTAT SURGICEL 2X14 (HEMOSTASIS) IMPLANT
KIT BASIN OR (CUSTOM PROCEDURE TRAY) ×2 IMPLANT
KIT SUCTION CATH 14FR (SUCTIONS) IMPLANT
KIT TURNOVER KIT B (KITS) ×2 IMPLANT
NS IRRIG 1000ML POUR BTL (IV SOLUTION) ×2 IMPLANT
PACK GENERAL/GYN (CUSTOM PROCEDURE TRAY) ×2 IMPLANT
PAD ARMBOARD 7.5X6 YLW CONV (MISCELLANEOUS) ×4 IMPLANT
POWDER MYRIAD MORCLLS FINE 500 (Miscellaneous) IMPLANT
PWDR MYRIAD MORCELLS FINE 500 (Miscellaneous) ×2 IMPLANT
SET HNDPC FAN SPRY TIP SCT (DISPOSABLE) ×2 IMPLANT
SPONGE T-LAP 18X18 ~~LOC~~+RFID (SPONGE) ×8 IMPLANT
SPONGE T-LAP 4X18 ~~LOC~~+RFID (SPONGE) ×2 IMPLANT
STAPLER VISISTAT 35W (STAPLE) IMPLANT
SURGILUBE 2OZ TUBE FLIPTOP (MISCELLANEOUS) IMPLANT
SUT ETHILON 3 0 FSL (SUTURE) IMPLANT
SUT VIC AB 1 CTX 36 (SUTURE)
SUT VIC AB 1 CTX36XBRD ANBCTR (SUTURE) IMPLANT
SUT VIC AB 2-0 CTX 27 (SUTURE) IMPLANT
SUT VIC AB 3-0 SH 18 (SUTURE) IMPLANT
SUT VIC AB 3-0 X1 27 (SUTURE) IMPLANT
SWAB COLLECTION DEVICE MRSA (MISCELLANEOUS) IMPLANT
SWAB CULTURE ESWAB REG 1ML (MISCELLANEOUS) IMPLANT
TOWEL GREEN STERILE (TOWEL DISPOSABLE) ×2 IMPLANT
TOWEL GREEN STERILE FF (TOWEL DISPOSABLE) ×2 IMPLANT
TRAY FOLEY MTR SLVR 16FR STAT (SET/KITS/TRAYS/PACK) IMPLANT
WATER STERILE IRR 1000ML POUR (IV SOLUTION) ×2 IMPLANT

## 2023-10-30 NOTE — Progress Notes (Signed)
VAD Coordinator Procedure Note:   VAD Coordinator met patient in short stay. Pt undergoing debridement of drive line with possible placement of wound vac per Dr. Maren Beach. Hemodynamics and VAD parameters monitored by myself and anesthesia throughout the procedure. Blood pressures were obtained with automatic cuff on right arm.    Time: Doppler Auto  BP Flow PI Power Speed  Pre-procedure:  1530  99/69(75) 4.2 3.2 4.3 5600  Sedation Induction: 1400  95/79(86) 4.2 3.0 4.3 5650   1415  121/90(102) 4.4 2.9 4.2 5650   1430  104/92(98) 3.6 6.0 4.3 5600   1445  98/66(77) 4.0 3.9 4.2 5600   1500  94/65(75) 4.1 3.6 4.2 5600   1515  93/58(70) 3.6 2.7 4.1 5600  Recovery Area: 1530  118/84(94) 4.0 3.6 4.3 5600   1545  104/84(90) 4.0 3.2 3.3 5600   1600  104/92(98) 4.0 3.3 4.3 5650   Patient tolerated the procedure well. VAD Coordinator accompanied and remained with patient in recovery area. Heparin on hold tonight per Dr.Vantrigt. Pharmacy aware. Report given to Creschenda RN.   Patient Disposition: 2C02

## 2023-10-30 NOTE — Progress Notes (Signed)
RN at bedside pain in 10/10 tele monitor began alarming [facility].  Pt asymptomatic. LVAD coordinator paged. Several runs of VTACH. Longest run 25 beats. LVAD coordinator at bedside. Mag IV and labs ordered

## 2023-10-30 NOTE — Progress Notes (Signed)
Attempted to get his standing wt. He requested to come after an Hour. Will retry.

## 2023-10-30 NOTE — TOC Progression Note (Signed)
Transition of Care [facility]) - Progression Note    Patient Details  Name: Joel York MRN: [id number] Date of Birth: 12/15/1986  [facility] CM/SW Contact  Nicanor Bake Phone Number: [phone] 10/30/2023, 10:13 AM  Clinical Narrative:   [facility] CSW called to schedule pts appointment at [facility] follow up appointment scheduled for Friday, November 28, 2023 at 2:00 PM.   [facility] will continue following.     Expected Discharge Plan: Home w Home Health Services Barriers to Discharge: Continued Medical Work up  Expected Discharge Plan and Services       Living arrangements for the past 2 months: Single Family Home                                       Social Determinants of Health (SDOH) Interventions SDOH Screenings   Food Insecurity: No Food Insecurity (10/28/2023)  Housing: Low Risk  (10/28/2023)  Transportation Needs: No Transportation Needs (10/28/2023)  Utilities: Not At Risk (10/28/2023)  Alcohol Screen: Low Risk  (07/14/2023)  Financial Resource Strain: Medium Risk (08/12/2023)  Physical Activity: Sufficiently Active (07/14/2023)  Stress: Stress Concern Present (07/14/2023)  Tobacco Use: High Risk (10/28/2023)    Readmission Risk Interventions     No data to display

## 2023-10-30 NOTE — Brief Op Note (Signed)
10/30/2023  3:33 PM  PATIENT:  Joel York  37 y.o. male  PRE-OPERATIVE DIAGNOSIS:  VAD tunnel infection  POST-OPERATIVE DIAGNOSIS:  VAD tunnel infection  PROCEDURE:  Procedure(s): DEBRIDEMENT OF VAD TUNNEL (N/A) APPLICATION OF VERAFLO WOUND VAC (N/A)  SURGEON:  Surgeons and Role:    Lovett Sox, MD - Primary  PHYSICIAN ASSISTANT:na  ASSISTANTS: RN   ANESTHESIA:   general  EBL:  25 mL   BLOOD ADMINISTERED:none  DRAINS: none   LOCAL MEDICATIONS USED:  NONE  SPECIMEN:  Scraping  DISPOSITION OF SPECIMEN:   culture  COUNTS:  YES  TOURNIQUET:  * No tourniquets in log *  DICTATION: .Dragon Dictation  PLAN OF CARE:  retrurn to [facility] 02  PATIENT DISPOSITION:  PACU - hemodynamically stable.   Delay start of Pharmacological VTE agent (>24hrs) due to surgical blood loss or risk of bleeding: yes Resume heparin at 6am low dose

## 2023-10-30 NOTE — Progress Notes (Addendum)
PHARMACY - ANTICOAGULATION CONSULT NOTE  Pharmacy Consult for heparin and warfarin Indication: LVAD  Allergies  Allergen Reactions   Chlorhexidine Dermatitis and Rash   Other Anaphylaxis    Tree Nuts   Peanuts [Peanut Oil] Anaphylaxis    Patient Measurements: Height: 5\' 7"  (170.2 cm) Weight: 66.2 kg (145 lb 15.1 oz) IBW/kg (Calculated) : 66.1 Heparin Dosing Weight: 64 kg  Vital Signs: Temp: 97.8 F (36.6 C) (11/07 0749) Temp Source: Oral (11/07 0749) BP: 100/66 (11/07 0749) Pulse Rate: 82 (11/07 0941)  Labs: Recent Labs    10/28/23 2135 10/29/23 0237 10/30/23 0245  HGB 12.1* 11.1* 11.4*  HCT 38.7* 34.4* 35.4*  PLT 279 237 252  LABPROT 23.8* 26.1* 20.0*  INR 2.1* 2.4* 1.7*  CREATININE 0.76 0.79 0.66    Estimated Creatinine Clearance: 119.3 mL/min (by C-G formula based on SCr of 0.66 mg/dL).   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 Patient had significant bleeding at driveline site on 09/30/23 requiring FFP. PTA Warfarin is on hold. Pharmacy consulted for heparin.   INR today down to 1.7. Last warfarin dose 11/4. Hgb 11.4, plt 252. LDH 134. No s/sx of bleeding.   Warfarin PTA: 3mg  MWF, 2mg  TTSS but INRs have been subtherapeutic.  Goal of Therapy:  Heparin level 0.3-0.5 units/ml INR 2- 2.5  Monitor platelets by anticoagulation protocol: Yes   Plan:  Planning to start low dose heparin gtt at 500 units/hr once INR less than 2, but per discussion with Dr. Shirlee Latch and Dr. Donata Clay, will hold off on anticoagulation for this evening, and reassess in the morning.  Reece Leader, Colon Flattery, BCCP Clinical Pharmacist  10/30/2023 3:20 PM   [facility] pharmacy phone numbers are listed on [email or web address]

## 2023-10-30 NOTE — Op Note (Signed)
NAMEATIF, CHAPPLE MEDICAL RECORD NO: [id number] ACCOUNT NO: [id number] DATE OF BIRTH: 10-Dec-1986 FACILITY: [facility] LOCATION: [facility] PHYSICIAN: Kerin Perna III, MD  Operative Report   DATE OF PROCEDURE: 10/30/2023  OPERATION: 1.  Excisional debridement of VAD tunnel wound (excision of skin, subcutaneous fat, fascia). 2.  Pulse lavage irrigation of wound with Vashe wound solution. 3.  Placement of 500 mg of Myriad Morcells. 4.  Placement of wound VAC.  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  SURGEON:  Kathlee Nations Trigt III, MD  ANESTHESIA:  General.  DESCRIPTION OF PROCEDURE:  The patient was evaluated in the preoperative holding where informed consent was documented and the procedure discussed again in detail regarding the expected benefits, the alternatives, and the risks of bleeding, recurrent  infection, pain and organ injury.  The patient demonstrated his understanding and agreed to proceed.  The patient was brought from preoperative holding to the OR by the anesthesia team as well as by the VAD coordinator who accompanied the patient and was present for the entire procedure to monitor the VAD equipment and to assist with hemodynamic  management of the patient during the procedure and anesthesia.  The patient was placed supine on the operating room table and placed under monitoring.  General anesthesia was induced and the patient was intubated.  The patient remained stable.  The previous wound was exposed and all the packing removed.  The abdomen  and lower chest were prepped and draped as a sterile field.  A proper timeout was performed.  The wound was cultured.  The wound was inspected.  There was some infected poor quality granulation tissue at the upper extent of the wound where the power cord  was  located.  The skin was excised and the subcutaneous tissue was excised to open up this entire area and the cord tunnel was sharply debrided also with curettes.  The wound was irrigated with a liter of Vashe wound solution.  Hemostasis was achieved.   500 mg of Myriad Morcells were placed in the bed of the upper wound.  A wound VAC was then placed and the sponge cut to the appropriate size and orientation to accommodate the power cord.  Prior to placing the sheaths over the wound VAC, the lower aspect of the incision, which had healed except for 4 mm of the skin was closed with interrupted nylon suture.  The main wound measured 11 cm long x 3 cm wide x 1 cm deep and this was covered with the wound VAC sponge.  The wound VAC sheets were placed over the sponge and secured around the power cord.  An opening was created for the sponge and the suction line  was attached and connected to the wound VAC pump and compression of the sponge was good and then the seal test was satisfactory.  The patient was then reversed from anesthesia and taken to the recovery room for further observation accompanied by the VAD  coordinator.   PUS D: 10/30/2023 3:51:19 pm T: 10/30/2023 7:29:00 pm  JOB: [id number]/ [id number]

## 2023-10-30 NOTE — Anesthesia Procedure Notes (Signed)
Procedure Name: Intubation Date/Time: 10/30/2023 2:04 PM  Performed by: Marena Chancy, CRNAPre-anesthesia Checklist: Patient identified, Emergency Drugs available, Suction available and Patient being monitored Patient Re-evaluated:Patient Re-evaluated prior to induction Oxygen Delivery Method: Circle system utilized Preoxygenation: Pre-oxygenation with 100% oxygen Induction Type: IV induction Ventilation: Mask ventilation without difficulty Laryngoscope Size: Miller and 2 Grade View: Grade I Tube size: 7.5 mm Number of attempts: 1 Airway Equipment and Method: Stylet and Oral airway Placement Confirmation: ETT inserted through vocal cords under direct vision, positive ETCO2 and breath sounds checked- equal and bilateral Secured at: 22 cm Tube secured with: Tape Dental Injury: Teeth and Oropharynx as per pre-operative assessment

## 2023-10-30 NOTE — Progress Notes (Signed)
LVAD Coordinator Rounding Note:  Admitted 10/28/23 to heart failure service due to worsening driveline infection. Pt seen in VAD clinic 11/4- wound looked progressively worse. Decision was made to admit for further IV antibiotics and debridement.   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  Cultures positive for MSSA 10/24/23. ID recommending 6 weeks IV Cefazolin 2g IV q8h. End date 11/18/23.   Pt laying in bed. Reports pain controlled at wound site at the moment.   Rash scabbed over on arms, neck, and back. Rash still present on abdomen but pt states this is less painful/itchy.   ID consulted. Stopped Maxipime and Vancomycin. Transitioned to Daptomycin 500 mg daily.   Plan for wound debridement and wound vac placement in OR this afternoon per Dr Donata Clay.   Vital signs: Temp: 987.8 HR: 91 Doppler Pressure: 78 Auto BP: 100/66 (75) O2 Sat: 98% on RA Wt: 141.1>141.5>145.9 lbs    LVAD interrogation reveals:  Speed: 5600 Flow: 4.1 Power: 4.3 w PI: 3.7  Alarms: none Events: rare Hematocrit: 37  Fixed speed: 5600 Low speed limit: 5300  Drive Line: Existing VAD dressing CDI. Anchor secure. Plan for drive line wound debridement with placement of wound vac this afternoon with Dr. Maren Beach.  Labs:  LDH trend: 133>134  INR trend: 2.4>1.7  WBC trend: 6.2>5.6  Anticoagulation Plan: -INR Goal: 2-2.5 -ASA Dose: off  Blood Products:  10/29/23>> 2 FFP  Infection:  10/24/23>>wound cx>>STAPH AUREUS; final  Adverse Events on VAD: - Admitted 09/29/23 with DL infection. Wound cx + staph aureus. Underwent I&D/wound vac change x3. ID saw, discharged home on 6 weeks of IV cefazolin (end date 11/18/23).   Plan/Recommendations:  1. Please page VAD coordinator for any alarms or VAD equipment issues. 3. VAD coordinator will accompany pt to OR tomorrow for drive line debridement  Simmie Davies RN,BSN VAD Coordinator  Office: [phone]  24/7 Pager: [phone]

## 2023-10-30 NOTE — Plan of Care (Signed)
  Problem: Education: Goal: Patient will understand all VAD equipment and how it functions Outcome: Progressing Goal: Patient will be able to verbalize current INR target range and antiplatelet therapy for discharge home Outcome: Progressing   Problem: Cardiac: Goal: LVAD will function as expected and patient will experience no clinical alarms Outcome: Progressing   Problem: Education: Goal: Knowledge of General Education information will improve Description: Including pain rating scale, medication(s)/side effects and non-pharmacologic comfort measures Outcome: Progressing   Problem: Health Behavior/Discharge Planning: Goal: Ability to manage health-related needs will improve Outcome: Progressing   Problem: Clinical Measurements: Goal: Ability to maintain clinical measurements within normal limits will improve Outcome: Progressing Goal: Diagnostic test results will improve Outcome: Progressing Goal: Respiratory complications will improve Outcome: Progressing Goal: Cardiovascular complication will be avoided Outcome: Progressing   Problem: Activity: Goal: Risk for activity intolerance will decrease Outcome: Progressing   Problem: Nutrition: Goal: Adequate nutrition will be maintained Outcome: Progressing   Problem: Coping: Goal: Level of anxiety will decrease Outcome: Progressing   Problem: Elimination: Goal: Will not experience complications related to bowel motility Outcome: Progressing Goal: Will not experience complications related to urinary retention Outcome: Progressing   Problem: Pain Management: Goal: General experience of comfort will improve Outcome: Progressing   Problem: Safety: Goal: Ability to remain free from injury will improve Outcome: Progressing   Problem: Skin Integrity: Goal: Risk for impaired skin integrity will decrease Outcome: Progressing

## 2023-10-30 NOTE — Progress Notes (Signed)
Pre Procedure note for inpatients:   Joel York has been scheduled for Procedure(s): DEBRIDEMENT OF VAD TUNNEL (N/A) POSSIBLE APPLICATION OF VERAFLO WOUND VAC (N/A) today. The various methods of treatment have been discussed with the patient. After consideration of the risks, benefits and treatment options the patient has consented to the planned procedure.   The patient has been seen and labs reviewed. There are no changes in the patient's condition to prevent proceeding with the planned procedure today.  Recent labs:  Lab Results  Component Value Date   WBC 5.6 10/30/2023   HGB 11.4 (L) 10/30/2023   HCT 35.4 (L) 10/30/2023   PLT 252 10/30/2023   GLUCOSE 127 (H) 10/30/2023   CHOL 74 07/15/2023   TRIG 140 07/15/2023   HDL 16 (L) 07/15/2023   LDLCALC 30 07/15/2023   ALT 8 10/28/2023   AST 16 10/28/2023   NA 138 10/30/2023   K 3.8 10/30/2023   CL 102 10/30/2023   CREATININE 0.66 10/30/2023   BUN 10 10/30/2023   CO2 26 10/30/2023   TSH 4.982 (H) 07/14/2023   INR 1.7 (H) 10/30/2023   HGBA1C 6.0 (H) 07/30/2023   MICROALBUR <3.0 (H) 07/14/2023    I have reviewed the preop CT scan images. The INR is down to 1.7 The wound cultures show MRSA and  MSSA and patient is on daptomycin Plan repeat surgical debridement of VAD tunnel today DC iv heparin on call to OR  Lovett Sox, MD 10/30/2023 9:17 AM

## 2023-10-30 NOTE — Progress Notes (Signed)
VAD Coordinator paged regarding pt having multiple runs of VTACH. Longest run 25 beats. Pt asymptomatic. Discussed with Dr. Shirlee Latch. Orders placed for STAT BMET and CBC along with 2g of Mg IVPB. VAD personally interrogated no alarms or events noted. Discussed plan with Creschenda RN.  Simmie Davies RN, BSN VAD Coordinator 24/7 Pager [phone]

## 2023-10-30 NOTE — Transfer of Care (Signed)
Immediate Anesthesia Transfer of Care Note  Patient: Joel York  Procedure(s) Performed: DEBRIDEMENT OF VAD TUNNEL APPLICATION OF VERAFLO WOUND VAC (Chest)  Patient Location: PACU  Anesthesia Type:General  Level of Consciousness: awake, alert , and oriented  Airway & Oxygen Therapy: Patient Spontanous Breathing and Patient connected to face mask oxygen  Post-op Assessment: Report given to RN and Post -op Vital signs reviewed and stable  Post vital signs: Reviewed and stable  Last Vitals:  Vitals Value Taken Time  BP 118/84 10/30/23 1530  Temp    Pulse 85 10/30/23 1533  Resp 16 10/30/23 1533  SpO2 98 % 10/30/23 1533  Vitals shown include unfiled device data.  Last Pain:  Vitals:   10/30/23 1307  TempSrc: Oral  PainSc:       Patients Stated Pain Goal: 0 (10/29/23 0435)  Complications: No notable events documented.

## 2023-10-30 NOTE — Anesthesia Preprocedure Evaluation (Addendum)
Anesthesia Evaluation  Patient identified by MRN, date of birth, ID band Patient awake    Reviewed: Allergy & Precautions, H&P , NPO status , Patient's Chart, lab work & pertinent test results  Airway Mallampati: II   Neck ROM: full    Dental   Pulmonary asthma , Current Smoker   breath sounds clear to auscultation       Cardiovascular +CHF   Rhythm:regular Rate:Normal  LVAD placed 07/30/23. Now with driveline infection.  Dilated cardiomyopathy. EF 20%   Neuro/Psych Seizures -,  PSYCHIATRIC DISORDERS Anxiety        GI/Hepatic ,GERD  ,,  Endo/Other    Renal/GU      Musculoskeletal   Abdominal   Peds  Hematology   Anesthesia Other Findings   Reproductive/Obstetrics                             Anesthesia Physical Anesthesia Plan  ASA: 4  Anesthesia Plan: General   Post-op Pain Management:    Induction: Intravenous  PONV Risk Score and Plan: 1 and Ondansetron, Dexamethasone, Midazolam and Treatment may vary due to age or medical condition  Airway Management Planned: Oral ETT  Additional Equipment:   Intra-op Plan:   Post-operative Plan: Extubation in OR  Informed Consent: I have reviewed the patients History and Physical, chart, labs and discussed the procedure including the risks, benefits and alternatives for the proposed anesthesia with the patient or authorized representative who has indicated his/her understanding and acceptance.     Dental advisory given  Plan Discussed with: CRNA, Anesthesiologist and Surgeon  Anesthesia Plan Comments:        Anesthesia Quick Evaluation

## 2023-10-30 NOTE — Progress Notes (Signed)
Advanced Heart Failure VAD Team Note  PCP-Cardiologist: None   Subjective:   Admitted with recurrent driveline infection and PICC site infection w/ RUE cellulitis. Wound culture from 11/1 grew MRSA.    CT of C/A/P w/ small amount of periprosthetic fluid within the left pleural space adjacent to the proximal portion of the power cord. No other periprostatic fluid collection identified  ID now following, Abx switched to Daptomycin.   Afebrile. WBC nl. C/w drive-line site pain. Going back to OR today. INR 1.7  Denies CP. No dyspnea.    Flow 4.5  liters/min, speed 5600, power 4.1 PI 3.0 .  7 PI events.   Objective:    Vital Signs:   Temp:  [97.5 F (36.4 C)-97.8 F (36.6 C)] 97.6 F (36.4 C) (11/07 0246) Pulse Rate:  [75-94] 92 (11/07 0246) Resp:  [11-18] 11 (11/07 0500) BP: (87-116)/(61-80) 116/75 (11/07 0246) SpO2:  [95 %-97 %] 97 % (11/07 0246) Last BM Date : 11/15/23 Mean arterial Pressure 82  Intake/Output:   Intake/Output Summary (Last 24 hours) at 10/30/2023 0709 Last data filed at 10/30/2023 0250 Gross per 24 hour  Intake 1466.1 ml  Output 2950 ml  Net -1483.9 ml     Physical Exam    General:   well appearing, laying in bed. No distress.  HEENT: normal Neck: supple. JVD not elevated . Carotids 2+ bilat; no bruits. No lymphadenopathy or thyromegaly appreciated. Cor: Mechanical heart sounds with LVAD hum present. Lungs: CTAB. No wheezing.  Abdomen: soft, nontender, nondistended. No hepatosplenomegaly. No bruits or masses. Good bowel sounds. Driveline: Dressing in place.  Extremities: no cyanosis, clubbing, rash, edema Neuro: alert & orientedx3, cranial nerves grossly intact. moves all 4 extremities w/o difficulty. Affect pleasant   Telemetry   N/A currently off Tele   EKG    N/A  Labs   Basic Metabolic Panel: Recent Labs  Lab 10/28/23 2135 10/29/23 0237 10/30/23 0245  NA 141 139 138  K 3.4* 4.0 3.8  CL 103 103 102  CO2 27 28 26   GLUCOSE 94  96 127*  BUN 11 11 10   CREATININE 0.76 0.79 0.66  CALCIUM 9.0 8.6* 8.8*  MG 1.9  --   --     Liver Function Tests: Recent Labs  Lab 10/28/23 2135  AST 16  ALT 8  ALKPHOS 150*  BILITOT 0.5  PROT 7.2  ALBUMIN 3.5   No results for input(s): "LIPASE", "AMYLASE" in the last 168 hours. No results for input(s): "AMMONIA" in the last 168 hours.  CBC: Recent Labs  Lab 10/28/23 2135 10/29/23 0237 10/30/23 0245  WBC 6.9 6.2 5.6  NEUTROABS 3.8  --   --   HGB 12.1* 11.1* 11.4*  HCT 38.7* 34.4* 35.4*  MCV 88.6 87.3 86.1  PLT 279 237 252    INR: Recent Labs  Lab 10/24/23 1524 10/28/23 2135 10/29/23 0237 10/30/23 0245  INR 1.6* 2.1* 2.4* 1.7*    Other results: EKG:    Imaging   CT CHEST ABDOMEN PELVIS W CONTRAST  Result Date: 10/29/2023 CLINICAL DATA:  Sepsis EXAM: CT CHEST, ABDOMEN, AND PELVIS WITH CONTRAST TECHNIQUE: Multidetector CT imaging of the chest, abdomen and pelvis was performed following the standard protocol during bolus administration of intravenous contrast. RADIATION DOSE REDUCTION: This exam was performed according to the departmental dose-optimization program which includes automated exposure control, adjustment of the mA and/or kV according to patient size and/or use of iterative reconstruction technique. CONTRAST:  75mL OMNIPAQUE IOHEXOL 350 MG/ML SOLN COMPARISON:  09/30/2023 FINDINGS: CT CHEST FINDINGS Cardiovascular: Left ventricular assist device is in place. Outflow cannula appears patent without periprostatic fluid collection identified. Wide patency of the outflow cannula-ascending aorta anastomosis. Power cord exits the left parasagittal mid abdomen with a small amount of periprosthetic fluid within the left pleural space adjacent to the proximal portion of the cord. There is probable packing material seen at the cord exit site as well as just proximal to this on image # 68/3 where the cord appears at the base of a shallow ulcer. This is best seen on  image # 102/4. Cardiac size is within normal limits. No pericardial effusion. Central pulmonary arteries are of normal caliber. Thoracic aorta is otherwise unremarkable. Mediastinum/Nodes: No enlarged mediastinal, hilar, or axillary lymph nodes. Thyroid gland, trachea, and esophagus demonstrate no significant findings. Lungs/Pleura: Bibasilar atelectasis. No confluent pulmonary infiltrate. No pneumothorax or significant pleural effusion. Central airways are widely patent. Musculoskeletal: No chest wall mass or suspicious bone lesions identified. CT ABDOMEN PELVIS FINDINGS Hepatobiliary: No focal liver abnormality is seen. Status post cholecystectomy. No biliary dilatation. Pancreas: Unremarkable Spleen: Mild splenomegaly with the spleen measuring 15.2 cm in greatest dimension. No intrasplenic lesions are seen. Splenic vein is patent. Adrenals/Urinary Tract: The adrenal glands are unremarkable. The kidneys are normal in size and position. Punctate cortical calcifications seen within the interpolar region of the left kidney likely relates to remote trauma or inflammation. The kidneys are otherwise unremarkable. Bladder unremarkable. Stomach/Bowel: Stomach is within normal limits. Appendix appears normal. No evidence of bowel wall thickening, distention, or inflammatory changes. Vascular/Lymphatic: No significant vascular findings are present. No enlarged abdominal or pelvic lymph nodes. Reproductive: Prostate is unremarkable. Other: No abdominal wall hernia.  No abdominopelvic ascites. Musculoskeletal: No acute or significant osseous findings. IMPRESSION: 1. Left ventricular assist device in place. Outflow cannula appears patent without periprostatic fluid collection identified. Wide patency of the outflow cannula-ascending aorta anastomosis. 2. Power cord exits the left parasagittal mid abdomen with a small amount of periprosthetic fluid within the left pleural space adjacent to the proximal portion of the cord.  Subjacent to an anterior abdominal wall dressing, there is probable packing material seen at the cord exit site as well as just proximal to this where the cord appears at the base of a shallow ulcer. No associated drainable fluid collection identified. 3. Mild splenomegaly. Electronically Signed   By: Helyn Numbers M.D.   On: 10/29/2023 18:54     Medications:     Scheduled Medications:  buprenorphine-naloxone  1 tablet Sublingual Daily   digoxin  0.125 mg Oral Daily   gabapentin  300 mg Oral TID   ivabradine  5 mg Oral BID WC   losartan  25 mg Oral BID   pantoprazole  40 mg Oral Daily   sertraline  25 mg Oral Daily   spironolactone  25 mg Oral Daily   zinc sulfate  220 mg Oral Daily    Infusions:  DAPTOmycin 500 mg (10/29/23 1728)    PRN Medications: acetaminophen, HYDROmorphone, hydrOXYzine, traZODone   Patient Profile  Joel York is a 37 yo male with PMH of 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 Now presenting with driveline and PICC line infection.    Assessment/Plan:   1. 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 Went for I&D/wound vac change x3. Discharged home with 6 weeks of IV abx.  - Now re-admitted with persistent driveline  infection.  - Wound cultures - staph aureus  - Wound culture from 11/1 grew MRSA.   - CT of C/A/P w/ small amount of periprosthetic fluid within the left pleural space adjacent to the proximal portion of the power cord. No other periprostatic fluid collection identified - ID now following, Abx switched to Daptomycin.  - to OR today for I&D + wound vac placement    2. PICC line infection - PICC pulled in clinic 10/27/23 - abx per above. ID following  - line holiday. Will discuss timing of PICC placement with ID   3. 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 Echo EF  <20%,  RV mildly reduced, - Etiology uncertain. ? 2/2 PVCs vs genetically mediated +/- hypertension.  - LHC: normal coronaries.  - cMRI:  LV markedly dilated EF 10% RV 17% NICM - Underwent HM-III VAD on 07/28/25. Much improved NYHA I-II  - INR 1.7 - Volume status stable.   - Continue digoxin 0.125 mg daily - Continue losartan 25 mg BID - Continue spiro 25 mg daily - Continue Ivabradine 5 BID   4. 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 Hx drug abuse/Tobacco abuse - reports quit smoking 7/24, cotinine level at 30.1 09/22/23 - Continue suboxone and PRN dilaudid  - Follows with pain clinic   I reviewed the LVAD parameters from today, and compared the results to the patient's prior recorded data.  No programming changes were made.  The LVAD is functioning within specified parameters.  The patient performs LVAD self-test daily.  LVAD interrogation was negative for any significant power changes, alarms or PI events/speed drops.  LVAD equipment check completed and is in good working order.  Back-up equipment present.   LVAD education done on emergency procedures and precautions and reviewed exit site care.  Length of Stay: 2  Robbie Lis, PA-C 10/30/2023, 7:09 AM  VAD Team --- VAD ISSUES ONLY--- Pager [phone] (7am - 7am)  Advanced Heart Failure Team  Pager [phone] (M-F; 7a - 5p)  Please contact [facility] for night-coverage after hours (5p -7a ) and weekends on [email or web address]

## 2023-10-31 ENCOUNTER — Encounter (HOSPITAL_COMMUNITY): Payer: Self-pay | Admitting: Cardiothoracic Surgery

## 2023-10-31 ENCOUNTER — Telehealth (HOSPITAL_COMMUNITY): Payer: Self-pay | Admitting: Pharmacy Technician

## 2023-10-31 ENCOUNTER — Other Ambulatory Visit (HOSPITAL_BASED_OUTPATIENT_CLINIC_OR_DEPARTMENT_OTHER): Payer: Self-pay

## 2023-10-31 ENCOUNTER — Other Ambulatory Visit (HOSPITAL_COMMUNITY): Payer: Self-pay

## 2023-10-31 DIAGNOSIS — T827XXA Infection and inflammatory reaction due to other cardiac and vascular devices, implants and grafts, initial encounter: Secondary | ICD-10-CM | POA: Diagnosis not present

## 2023-10-31 LAB — BASIC METABOLIC PANEL
Anion gap: 12 (ref 5–15)
BUN: 12 mg/dL (ref 6–20)
CO2: 27 mmol/L (ref 22–32)
Calcium: 8.3 mg/dL — ABNORMAL LOW (ref 8.9–10.3)
Chloride: 98 mmol/L (ref 98–111)
Creatinine, Ser: 0.71 mg/dL (ref 0.61–1.24)
GFR, Estimated: >60 mL/min (ref >60)
Glucose, Bld: 160 mg/dL — ABNORMAL HIGH (ref 70–99)
Potassium: 4.4 mmol/L (ref 3.5–5.1)
Sodium: 137 mmol/L (ref 135–145)

## 2023-10-31 LAB — CBC
HCT: 34.8 % — ABNORMAL LOW (ref 39.0–52.0)
Hemoglobin: 11 g/dL — ABNORMAL LOW (ref 13.0–17.0)
MCH: 27.2 pg (ref 26.0–34.0)
MCHC: 31.6 g/dL (ref 30.0–36.0)
MCV: 85.9 fL (ref 80.0–100.0)
Platelets: 310 10*3/uL (ref 150–400)
RBC: 4.05 MIL/uL — ABNORMAL LOW (ref 4.22–5.81)
RDW: 13.9 % (ref 11.5–15.5)
WBC: 7.6 10*3/uL (ref 4.0–10.5)
nRBC: 0 % (ref 0.0–0.2)

## 2023-10-31 LAB — LACTATE DEHYDROGENASE: LDH: 124 U/L (ref 98–192)

## 2023-10-31 LAB — HEPARIN LEVEL (UNFRACTIONATED): Heparin Unfractionated: <0.1 [IU]/mL — ABNORMAL LOW (ref 0.30–0.70)

## 2023-10-31 LAB — CK: Total CK: 50 U/L (ref 49–397)

## 2023-10-31 LAB — PROTIME-INR
INR: 1.3 — ABNORMAL HIGH (ref 0.8–1.2)
Prothrombin Time: 16.7 s — ABNORMAL HIGH (ref 11.4–15.2)

## 2023-10-31 MED ORDER — ORAL CARE MOUTH RINSE: 15.0000 mL | OROMUCOSAL | Status: DC | PRN

## 2023-10-31 MED ORDER — HYDROMORPHONE HCL 1 MG/ML IJ SOLN
2.0000 mg | INTRAMUSCULAR | Status: DC | PRN
  Filled 2023-10-31: qty 2

## 2023-10-31 MED ORDER — HYDROMORPHONE HCL 1 MG/ML IJ SOLN
2.0000 mg | INTRAMUSCULAR | Status: AC | PRN
  Administered 2023-10-31 – 2023-11-01 (×8): 2 mg via INTRAVENOUS
  Filled 2023-10-31 (×7): qty 2

## 2023-10-31 MED ORDER — HEPARIN (PORCINE) 25000 UT/250ML-% IV SOLN
500.0000 [IU]/h | INTRAVENOUS | Status: DC
  Administered 2023-10-31 – 2023-11-12 (×7): 500 [IU]/h via INTRAVENOUS
  Filled 2023-10-31 (×6): qty 250

## 2023-10-31 MED ORDER — WARFARIN - PHARMACIST DOSING INPATIENT: Freq: Every day | Status: DC

## 2023-10-31 MED ORDER — WARFARIN SODIUM 2 MG PO TABS
2.0000 mg | ORAL_TABLET | Freq: Once | ORAL | Status: AC
  Administered 2023-10-31: 2 mg via ORAL
  Filled 2023-10-31: qty 1

## 2023-10-31 NOTE — Progress Notes (Addendum)
LVAD Coordinator Rounding Note:  Admitted 10/28/23 to heart failure service due to worsening driveline infection. Pt seen in VAD clinic 11/4- wound looked progressively worse. Decision was made to admit for further IV antibiotics and debridement.   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  Cultures positive for MSSA 10/24/23. ID recommending 6 weeks IV Cefazolin 2g IV q8h. End date 11/18/23.   Pt laying in bed. Reports pain at wound vac site.    Rash scabbed over on arms, neck, and back. Rash still present on abdomen but pt states this is less painful/itchy.   ID consulted. Stopped Maxipime and Vancomycin. Transitioned to Daptomycin 500 mg daily.   Plan for wound debridement and wound vac placement in OR next Friday 11/15 per Dr Donata Clay.   Vital signs: Temp: 97.7 HR: 86 Doppler Pressure: 82 Auto BP: 93/69 (78) O2 Sat: 96% on RA Wt: 141.1>141.5>145.9>150.2 lbs    LVAD interrogation reveals:  Speed: 5600 Flow: 4.1 Power: 4.4 w PI: 2.9  Alarms: none Events: rare Hematocrit: 37  Fixed speed: 5600 Low speed limit: 5300  Drive Line: Wound vac dressing clean, dry, intact. Negative pressure -125. Minimal serosanguinous drainage in canister. Plan for wound vac change in OR Friday 11/15 with Dr Donata Clay.   Labs:  LDH trend: 098>119>147  INR trend: 2.4>1.7>1.3  WBC trend: 6.2>5.6>7.6  Anticoagulation Plan: -INR Goal: 2-2.5 -ASA Dose: off  Blood Products:  10/29/23>> 2 FFP  Infection:  10/24/23>>wound cx>>STAPH AUREUS; final  Drips:  Heparin 500 units/hr  Adverse Events on VAD: - Admitted 09/29/23 with DL infection. Wound cx + staph aureus. Underwent I&D/wound vac change x3. ID saw, discharged home on 6 weeks of IV cefazolin (end date 11/18/23).   Plan/Recommendations:  1. Please page VAD coordinator for any alarms or VAD equipment issues. 2. Page VAD coordinator for wound vac issues 3. VAD coordinator will accompany pt to OR Friday 11/15  Simmie Davies RN,BSN VAD Coordinator  Office: [phone]  24/7 Pager: [phone]

## 2023-10-31 NOTE — Anesthesia Postprocedure Evaluation (Signed)
Anesthesia Post Note  Patient: Joel York  Procedure(s) Performed: DEBRIDEMENT OF VAD TUNNEL APPLICATION OF VERAFLO WOUND VAC (Chest)     Patient location during evaluation: PACU Anesthesia Type: General Level of consciousness: awake and alert Pain management: pain level controlled Vital Signs Assessment: post-procedure vital signs reviewed and stable Respiratory status: spontaneous breathing, nonlabored ventilation, respiratory function stable and patient connected to nasal cannula oxygen Cardiovascular status: blood pressure returned to baseline and stable Postop Assessment: no apparent nausea or vomiting Anesthetic complications: no   No notable events documented.  Last Vitals:  Vitals:   10/31/23 0933 10/31/23 0943  BP:  93/69  Pulse: 86   Resp:    Temp:    SpO2:      Last Pain:  Vitals:   10/31/23 0926  TempSrc:   PainSc: 7                  Elaisha Zahniser S

## 2023-10-31 NOTE — Progress Notes (Signed)
1 Day Post-Op Procedure(s) (LRB): DEBRIDEMENT OF VAD TUNNEL (N/A) APPLICATION OF VERAFLO WOUND VAC (N/A) Subjective: Patient stable but complaining of persistent surgical pain after debridement of VAD tunnel yesterday. Minimal blood loss through the wound VAC system, patient will start on low-dose heparin and Coumadin will be gradually resumed by Pharm.D. Cultures from the OR yesterday show gram-positive cocci.  Continue with IV daptomycin.  Wound VAC sponge is compressed. Seal check on the [facility] system is normal.  Plan continue wound VAC therapy until next Friday 11-15 for wound VAC change in OR.  Objective: Vital signs in last 24 hours: Temp:  [97.2 F (36.2 C)-98.3 F (36.8 C)] 97.7 F (36.5 C) (11/08 0717) Pulse Rate:  [80-97] 87 (11/08 0324) Cardiac Rhythm: Normal sinus rhythm (11/08 0717) Resp:  [13-18] 15 (11/08 0324) BP: (89-118)/(61-92) 95/61 (11/08 0717) SpO2:  [93 %-100 %] 95 % (11/08 0324) Weight:  [66.2 kg-68.1 kg] 68.1 kg (11/08 0644)  Hemodynamic parameters for last 24 hours:  Afebrile sinus rhythm  Intake/Output from previous day: 11/07 0701 - 11/08 0700 In: 1030 [P.O.:480; IV Piggyback:550] Out: 875 [Urine:800; Drains:50; Blood:25] Intake/Output this shift: No intake/output data recorded.  Exam Alert and appropriate but complaining of surgical pain Normal sinus rhythm, normal VAD hum Lungs clear Surgical dressing intact.  Lab Results: Recent Labs    10/30/23 1957 10/31/23 0641  WBC 7.2 7.6  HGB 11.3* 11.0*  HCT 35.3* 34.8*  PLT 265 310   BMET:  Recent Labs    10/30/23 1957 10/31/23 0641  NA 137 137  K 3.8 4.4  CL 102 98  CO2 27 27  GLUCOSE 154* 160*  BUN 11 12  CREATININE 0.83 0.71  CALCIUM 8.9 8.3*    PT/INR:  Recent Labs    10/31/23 0641  LABPROT 16.7*  INR 1.3*   ABG    Component Value Date/Time   PHART 7.510 (H) 08/03/2023 0445   HCO3 27.3 08/03/2023 0445   TCO2 28 08/03/2023 0445   ACIDBASEDEF 2.0 07/31/2023 1704    O2SAT 68.1 08/08/2023 0535   CBG (last 3)  No results for input(s): "GLUCAP" in the last 72 hours.  Assessment/Plan: S/P Procedure(s) (LRB): DEBRIDEMENT OF VAD TUNNEL (N/A) APPLICATION OF VERAFLO WOUND VAC (N/A) Continue IV antibiotic and wound VAC therapy for persistent VAD tunnel wound. We will increase his intravenous pain medicine interval today, postop day 1 then hopefully oral dosing will be adequate. Plan return to the OR 11-15.  LOS: 3 days    Lovett Sox 10/31/2023

## 2023-10-31 NOTE — Telephone Encounter (Signed)
Pharmacy Patient Advocate Encounter  Received notification from [facility] that Prior Authorization for Sivextro 200 mg tablets has been APPROVED from 10/31/2023 to 10/30/2024. Ran test claim, Copay is $4.00. This test claim was processed through [facility]- copay amounts may vary at other pharmacies due to pharmacy/plan contracts, or as the patient moves through the different stages of their insurance plan.   PA #/Case ID/Reference #: [id number] Key: Danielle Dess

## 2023-10-31 NOTE — TOC Benefit Eligibility Note (Signed)
Patient Product/process development scientist completed.    The patient is insured through [facility]     Ran test claim for Sivextro 200 mg and Requires Prior Authorization   This test claim was processed through [employer]- copay amounts may vary at other pharmacies due to [employer], or as the patient moves through the different stages of their insurance plan.     Roland Earl, CPHT Pharmacy Technician III Certified Patient Advocate [facility] Pharmacy Patient Advocate Team Direct Number: [phone]  Fax: [phone]

## 2023-10-31 NOTE — Progress Notes (Signed)
Brief ID Note -   Too young to read on cultures but gram stain c/w previous staphylococcus infection. Susceptibilities are pending.   In thinking about discharge planning - it may be reasonable plan to switch to oral linezolid at discharge to avoid PICC line given he has such a hard time with the dressing / adhesive material and irritant dermatitis. This is highly bioavailable and will treat tunnel infection well.   Will review with ID pharmacy team about potential DDIs' with other psych/OUD medications to ensure he is not at high risk for serotonin syndrome in consideration of this plan.   Will follow up with Joselyn Glassman again next week.  Planned return to OR scheduled 11/15 - greatly appreciate Dr. Zenaida Niece Trigt's aggressive surgical care    Rexene Alberts, MSN, NP-C [facility]  [email or web address] Pager: [phone] Office: [phone] [facility] Main Line: [phone] *Secure Chat Communication Welcome

## 2023-10-31 NOTE — Progress Notes (Signed)
PHARMACY - ANTICOAGULATION CONSULT NOTE  Pharmacy Consult for heparin and warfarin Indication: LVAD  Allergies  Allergen Reactions   Chlorhexidine Dermatitis and Rash   Other Anaphylaxis    Tree Nuts   Peanuts [Peanut Oil] Anaphylaxis    Patient Measurements: Height: 5\' 7"  (170.2 cm) Weight: 68.1 kg (150 lb 3.2 oz) IBW/kg (Calculated) : 66.1 Heparin Dosing Weight: 64 kg  Vital Signs: Temp: 97.7 F (36.5 C) (11/08 1521) Temp Source: Oral (11/08 1521) BP: 106/71 (11/08 1521) Pulse Rate: 86 (11/08 0933)  Labs: Recent Labs    10/29/23 0237 10/30/23 0245 10/30/23 1957 10/31/23 0641 10/31/23 1542  HGB 11.1* 11.4* 11.3* 11.0*  --   HCT 34.4* 35.4* 35.3* 34.8*  --   PLT 237 252 265 310  --   LABPROT 26.1* 20.0*  --  16.7*  --   INR 2.4* 1.7*  --  1.3*  --   HEPARINUNFRC  --   --   --   --  <0.10*  CREATININE 0.79 0.66 0.83 0.71  --   CKTOTAL  --   --   --  50  --     Estimated Creatinine Clearance: 119.3 mL/min (by C-G formula based on SCr of 0.71 mg/dL).   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 Patient had significant bleeding at driveline site on 09/30/23 requiring FFP. PTA Warfarin is on hold. Pharmacy consulted for heparin.   INR is down to 1.3. Last warfarin dose 11/4. Hgb 11, plt 310. LDH 124. No s/sx of bleeding. Discussed with Dr Donata Clay, will start heparin infusion at low dose and plan for warfarin restart - aim for goal 1.5-2 until no longer planning for debridements.   Warfarin PTA: 3mg  MWF, 2mg  TTSS but INRs have been subtherapeutic.  PM f/u -  heparin level undetectable on 500 units/hr as expected  Goal of Therapy:  Heparin level <0.3 units/ml - no titration unless directed by MD INR 2- 2.5  long term >> plan for 1.5-2 while undergoing debridements  Monitor platelets by anticoagulation protocol: Yes   Plan:  Continue IV heparin 500 units/hr. No plans for heparin uptitration  Thank you for allowing pharmacy to participate in this patient's care,  Jenetta Downer, [facility] Clinical Pharmacist  10/31/2023 5:24 PM   [facility] pharmacy phone numbers are listed on [email or web address]

## 2023-10-31 NOTE — Plan of Care (Signed)
  Problem: Education: Goal: Patient will understand all VAD equipment and how it functions Outcome: Progressing   Problem: Cardiac: Goal: LVAD will function as expected and patient will experience no clinical alarms Outcome: Progressing   Problem: Education: Goal: Knowledge of General Education information will improve Description: Including pain rating scale, medication(s)/side effects and non-pharmacologic comfort measures Outcome: Progressing   Problem: Health Behavior/Discharge Planning: Goal: Ability to manage health-related needs will improve Outcome: Progressing   Problem: Clinical Measurements: Goal: Ability to maintain clinical measurements within normal limits will improve Outcome: Progressing Goal: Will remain free from infection Outcome: Progressing Goal: Diagnostic test results will improve Outcome: Progressing   Problem: Nutrition: Goal: Adequate nutrition will be maintained Outcome: Progressing   Problem: Elimination: Goal: Will not experience complications related to urinary retention Outcome: Progressing   Problem: Pain Management: Goal: General experience of comfort will improve Outcome: Progressing

## 2023-10-31 NOTE — Progress Notes (Signed)
Advanced Heart Failure VAD Team Note  PCP-Cardiologist: None   Subjective:   Admitted with recurrent driveline infection and PICC site infection w/ RUE cellulitis. Wound culture from 11/1 grew MRSA.    CT of C/A/P w/ small amount of periprosthetic fluid within the left pleural space adjacent to the proximal portion of the power cord. No other periprostatic fluid collection identified  ID now following, Abx switched to Daptomycin.   Afebrile. WBC nl. Some driveline site pain after he was taken back to the OR yesterday, controlled on pain medications.   Flow 4.1  liters/min, speed 5600, power 4.1 PI 3.1 .  Occasional PI events.   Objective:    Vital Signs:   Temp:  [97.2 F (36.2 C)-98.1 F (36.7 C)] 97.7 F (36.5 C) (11/08 1521) Pulse Rate:  [83-97] 86 (11/08 0933) Resp:  [13-18] 15 (11/08 0324) BP: (89-111)/(61-92) 106/71 (11/08 1521) SpO2:  [93 %-98 %] 95 % (11/08 0324) Weight:  [68.1 kg] 68.1 kg (11/08 0644) Last BM Date : 11/15/23 Mean arterial Pressure 80  Intake/Output:   Intake/Output Summary (Last 24 hours) at 10/31/2023 1553 Last data filed at 10/31/2023 1158 Gross per 24 hour  Intake 530 ml  Output 900 ml  Net -370 ml     Physical Exam    General:   well appearing, laying in bed. No distress.  HEENT: normal Neck: supple. JVD not elevated . Carotids 2+ bilat; no bruits. No lymphadenopathy or thyromegaly appreciated. Cor: Mechanical heart sounds with LVAD hum present. Lungs: CTAB. No wheezing.  Abdomen: soft, nontender, nondistended. No hepatosplenomegaly. No bruits or masses. Good bowel sounds. Driveline: Dressing in place.  Extremities: no cyanosis, clubbing, rash, edema Neuro: alert & orientedx3, cranial nerves grossly intact. moves all 4 extremities w/o difficulty. Affect pleasant   Telemetry   N/A currently off Tele   EKG    N/A  Labs   Basic Metabolic Panel: Recent Labs  Lab 10/28/23 2135 10/29/23 0237 10/30/23 0245 10/30/23 1957  10/31/23 0641  NA 141 139 138 137 137  K 3.4* 4.0 3.8 3.8 4.4  CL 103 103 102 102 98  CO2 27 28 26 27 27   GLUCOSE 94 96 127* 154* 160*  BUN 11 11 10 11 12   CREATININE 0.76 0.79 0.66 0.83 0.71  CALCIUM 9.0 8.6* 8.8* 8.9 8.3*  MG 1.9  --   --   --   --     Liver Function Tests: Recent Labs  Lab 10/28/23 2135  AST 16  ALT 8  ALKPHOS 150*  BILITOT 0.5  PROT 7.2  ALBUMIN 3.5   No results for input(s): "LIPASE", "AMYLASE" in the last 168 hours. No results for input(s): "AMMONIA" in the last 168 hours.  CBC: Recent Labs  Lab 10/28/23 2135 10/29/23 0237 10/30/23 0245 10/30/23 1957 10/31/23 0641  WBC 6.9 6.2 5.6 7.2 7.6  NEUTROABS 3.8  --   --   --   --   HGB 12.1* 11.1* 11.4* 11.3* 11.0*  HCT 38.7* 34.4* 35.4* 35.3* 34.8*  MCV 88.6 87.3 86.1 86.1 85.9  PLT 279 237 252 265 310    INR: Recent Labs  Lab 10/28/23 2135 10/29/23 0237 10/30/23 0245 10/31/23 0641  INR 2.1* 2.4* 1.7* 1.3*    Other results: EKG:    Imaging   No results found.   Medications:     Scheduled Medications:  buprenorphine-naloxone  1 tablet Sublingual Daily   digoxin  0.125 mg Oral Daily   gabapentin  300 mg  Oral TID   ivabradine  5 mg Oral BID WC   losartan  25 mg Oral BID   mupirocin ointment  1 Application Nasal BID   pantoprazole  40 mg Oral Daily   sertraline  25 mg Oral Daily   spironolactone  25 mg Oral Daily   Warfarin - Pharmacist Dosing Inpatient   Does not apply q1600   zinc sulfate  220 mg Oral Daily    Infusions:  DAPTOmycin 500 mg (10/30/23 1743)   heparin 500 Units/hr (10/31/23 0943)    PRN Medications: acetaminophen, HYDROmorphone (DILAUDID) injection, HYDROmorphone, hydrOXYzine, ketorolac, mouth rinse, traZODone   Patient Profile  Joel York is a 37 yo male with PMH of 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 Now presenting with driveline and PICC line infection.     Assessment/Plan:   1. 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 Went for I&D/wound vac change x3. Discharged home with 6 weeks of IV abx.  - Now re-admitted with persistent driveline infection.  - Wound cultures - staph aureus  - Wound culture from 11/1 grew MRSA.   - CT of C/A/P w/ small amount of periprosthetic fluid within the left pleural space adjacent to the proximal portion of the power cord. No other periprostatic fluid collection identified - ID now following, Abx switched to Daptomycin.  - to OR 11/7 for I&D + wound vac placement  - Tentative plan to return next Friday   2. PICC line infection - PICC pulled in clinic 10/27/23 - abx per above. ID following  - line holiday. Will discuss timing of PICC placement with ID   3. 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 Echo EF <20%,  RV mildly reduced, - Etiology uncertain. ? 2/2 PVCs vs genetically mediated +/- hypertension.  - LHC: normal coronaries.  - cMRI:  LV markedly dilated EF 10% RV 17% NICM - Underwent HM-III VAD on 07/28/25. Much improved NYHA I-II  - INR 1.7 - Volume status stable.   - Continue digoxin 0.125 mg daily - Continue losartan 25 mg BID - Continue spiro 25 mg daily - Continue Ivabradine 5 BID   4. 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 Hx drug abuse/Tobacco abuse - reports quit smoking 7/24, cotinine level at 30.1 09/22/23 - Continue suboxone and PRN dilaudid  - Follows with pain clinic   I reviewed the LVAD parameters from today, and compared the results to the patient's prior recorded data.  No programming changes were made.  The LVAD is functioning within specified parameters.  The patient performs LVAD self-test daily.  LVAD interrogation was negative for any significant power changes, alarms or PI events/speed drops.  LVAD equipment check completed and is in good working order.  Back-up  equipment present.   LVAD education done on emergency procedures and precautions and reviewed exit site care.  Length of Stay: 3  Romie Minus, MD 10/31/2023, 3:53 PM  VAD Team --- VAD ISSUES ONLY--- Pager [phone] (7am - 7am)  Advanced Heart Failure Team  Pager [phone] (M-F; 7a - 5p)  Please contact [facility] for night-coverage after hours (5p -7a ) and weekends on [email or web address]

## 2023-10-31 NOTE — Progress Notes (Signed)
PHARMACY - ANTICOAGULATION CONSULT NOTE  Pharmacy Consult for heparin and warfarin Indication: LVAD  Allergies  Allergen Reactions   Chlorhexidine Dermatitis and Rash   Other Anaphylaxis    Tree Nuts   Peanuts [Peanut Oil] Anaphylaxis    Patient Measurements: Height: 5\' 7"  (170.2 cm) Weight: 68.1 kg (150 lb 3.2 oz) IBW/kg (Calculated) : 66.1 Heparin Dosing Weight: 64 kg  Vital Signs: Temp: 97.7 F (36.5 C) (11/08 0717) Temp Source: Oral (11/08 0717) BP: 93/69 (11/08 0943) Pulse Rate: 86 (11/08 0933)  Labs: Recent Labs    10/29/23 0237 10/30/23 0245 10/30/23 1957 10/31/23 0641  HGB 11.1* 11.4* 11.3* 11.0*  HCT 34.4* 35.4* 35.3* 34.8*  PLT 237 252 265 310  LABPROT 26.1* 20.0*  --  16.7*  INR 2.4* 1.7*  --  1.3*  CREATININE 0.79 0.66 0.83 0.71  CKTOTAL  --   --   --  50    Estimated Creatinine Clearance: 119.3 mL/min (by C-G formula based on SCr of 0.71 mg/dL).   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 Patient had significant bleeding at driveline site on 09/30/23 requiring FFP. PTA Warfarin is on hold. Pharmacy consulted for heparin.   INR is down to 1.3. Last warfarin dose 11/4. Hgb 11, plt 310. LDH 124. No s/sx of bleeding. Discussed with Dr Donata Clay, will start heparin infusion at low dose and plan for warfarin restart - aim for goal 1.5-2 until no longer planning for debridements.   Warfarin PTA: 3mg  MWF, 2mg  TTSS but INRs have been subtherapeutic.  Goal of Therapy:  Heparin level <0.3 units/ml - no titration unless directed by MD INR 2- 2.5 long term >> plan for 1.5-2 while undergoing debridements  Monitor platelets by anticoagulation protocol: Yes   Plan:  Restart heparin gtt at 500 units/hr >>  6 hr HL Order warfarin 2 mg tonight  Monitor daily HL/INR, CBC, and for s/sx of bleeding   Thank you for allowing pharmacy to participate in this patient's care,  Sherron Monday, PharmD, BCCCP Clinical Pharmacist  Phone: [phone] 10/31/2023 10:03 AM  Please check AMION for all [facility] Pharmacy phone numbers After 10:00 PM, call Main Pharmacy [phone]

## 2023-10-31 NOTE — Progress Notes (Signed)
ID brief note   Remains afebrile 11/6 blood cx NGTD S/p I and D 11/7 by CTVS. Cx with NG, gram stain with GPC in pairs  On daptomycin ro cover MSSA and MRSA  Plan for next OR on 11/15 noted   Will follow cultures peripherally pending next OR for recs on antibiotics   Dr Drue Second covering this weekend and will fu cultures   Odette Fraction, MD Infectious Disease Physician [facility] [address] [place], [place] [address] Phone: [phone]  Fax: [phone]

## 2023-10-31 NOTE — TOC Progression Note (Signed)
Transition of Care [facility]) - Progression Note    Patient Details  Name: Joel York MRN: [id number] Date of Birth: May 18, 1986  [facility]) CM/SW Contact  Nicanor Bake Phone Number: [phone] 10/31/2023, 1:50 PM  Clinical Narrative:   HF CSW called the pts room, but there was no answer. CSW contacted the pts wife to share updates about his PCP hospital follow up appointment at [facility]. CSW left a VM. Information will also be located on dc summary paperwork.   TOC will continue following.     Expected Discharge Plan: Home w Home Health Services Barriers to Discharge: Continued Medical Work up  Expected Discharge Plan and Services       Living arrangements for the past 2 months: Single Family Home                                       Social Determinants of Health (SDOH) Interventions SDOH Screenings   Food Insecurity: No Food Insecurity (10/28/2023)  Housing: Low Risk  (10/28/2023)  Transportation Needs: No Transportation Needs (10/28/2023)  Utilities: Not At Risk (10/28/2023)  Alcohol Screen: Low Risk  (07/14/2023)  Financial Resource Strain: Medium Risk (08/12/2023)  Physical Activity: Sufficiently Active (07/14/2023)  Stress: Stress Concern Present (07/14/2023)  Tobacco Use: High Risk (10/30/2023)    Readmission Risk Interventions     No data to display

## 2023-10-31 NOTE — Progress Notes (Signed)
Arrived to patient room. Notified that patient in restroom. Notified nurse VAST would return. Tomasita Morrow, RN VAST

## 2023-11-01 DIAGNOSIS — Z95811 Presence of heart assist device: Secondary | ICD-10-CM | POA: Diagnosis not present

## 2023-11-01 DIAGNOSIS — T827XXA Infection and inflammatory reaction due to other cardiac and vascular devices, implants and grafts, initial encounter: Secondary | ICD-10-CM | POA: Diagnosis not present

## 2023-11-01 LAB — CBC
HCT: 35.9 % — ABNORMAL LOW (ref 39.0–52.0)
Hemoglobin: 11.5 g/dL — ABNORMAL LOW (ref 13.0–17.0)
MCH: 27.8 pg (ref 26.0–34.0)
MCHC: 32 g/dL (ref 30.0–36.0)
MCV: 86.9 fL (ref 80.0–100.0)
Platelets: 283 10*3/uL (ref 150–400)
RBC: 4.13 MIL/uL — ABNORMAL LOW (ref 4.22–5.81)
RDW: 14 % (ref 11.5–15.5)
WBC: 7.2 10*3/uL (ref 4.0–10.5)
nRBC: 0 % (ref 0.0–0.2)

## 2023-11-01 LAB — HEPARIN LEVEL (UNFRACTIONATED): Heparin Unfractionated: <0.1 [IU]/mL — ABNORMAL LOW (ref 0.30–0.70)

## 2023-11-01 LAB — PROTIME-INR
INR: 1.5 — ABNORMAL HIGH (ref 0.8–1.2)
Prothrombin Time: 17.8 s — ABNORMAL HIGH (ref 11.4–15.2)

## 2023-11-01 MED ORDER — WARFARIN SODIUM 1 MG PO TABS
1.0000 mg | ORAL_TABLET | Freq: Once | ORAL | Status: AC
  Administered 2023-11-01: 1 mg via ORAL
  Filled 2023-11-01: qty 1

## 2023-11-01 MED ORDER — HYDROMORPHONE HCL 1 MG/ML IJ SOLN
2.0000 mg | INTRAMUSCULAR | Status: AC | PRN
  Administered 2023-11-01 – 2023-11-03 (×11): 2 mg via INTRAVENOUS
  Filled 2023-11-01 (×11): qty 2

## 2023-11-01 NOTE — Progress Notes (Signed)
Patient ID: Joel York, male   DOB: March 09, 1986, 37 y.o.   MRN: [id number]   Advanced Heart Failure VAD Team Note  PCP-Cardiologist: None   Subjective:    Admitted with recurrent driveline infection and PICC site infection w/ RUE cellulitis. Wound culture from 11/1 grew MRSA.  11/7 I&D driveline exit site.   CT of C/A/P w/ small amount of periprosthetic fluid within the left pleural space adjacent to the proximal portion of the power cord. No other periprosthetic fluid collection identified  ID now following, on Daptomycin.   Afebrile. WBC nl. Ongoing driveline site pain, controlled with Dilaudid.    Flow 4.1  liters/min, speed 5600, power 4,  PI 3.1.  Occasional PI events.   Objective:    Vital Signs:   Temp:  [97.7 F (36.5 C)-98.5 F (36.9 C)] 97.8 F (36.6 C) (11/09 0821) Pulse Rate:  [78-95] 78 (11/09 0606) Resp:  [8-20] 20 (11/09 0821) BP: (102-120)/(67-94) 120/94 (11/09 0821) SpO2:  [93 %-99 %] 99 % (11/09 0821) Weight:  [69.9 kg] 69.9 kg (11/09 0606) Last BM Date : 10/31/23 Mean arterial Pressure 100  Intake/Output:   Intake/Output Summary (Last 24 hours) at 11/01/2023 1149 Last data filed at 11/01/2023 0800 Gross per 24 hour  Intake 240 ml  Output 1250 ml  Net -1010 ml     Physical Exam    General: Well appearing this am. NAD.  HEENT: Normal. Neck: Supple, JVP 7-8 cm. Carotids OK.  Cardiac:  Mechanical heart sounds with LVAD hum present.  Lungs:  CTAB, normal effort.  Abdomen:  NT, ND, no HSM. No bruits or masses. +BS  LVAD exit site: Wound vac Extremities:  Warm and dry. No cyanosis, clubbing, rash, or edema.  Neuro:  Alert & oriented x 3. Cranial nerves grossly intact. Moves all 4 extremities w/o difficulty. Affect pleasant     Telemetry   NSR, personally reviewed  EKG    N/A  Labs   Basic Metabolic Panel: Recent Labs  Lab 10/28/23 2135 10/29/23 0237 10/30/23 0245 10/30/23 1957 10/31/23 0641  NA 141 139 138 137 137  K 3.4* 4.0 3.8  3.8 4.4  CL 103 103 102 102 98  CO2 27 28 26 27 27   GLUCOSE 94 96 127* 154* 160*  BUN 11 11 10 11 12   CREATININE 0.76 0.79 0.66 0.83 0.71  CALCIUM 9.0 8.6* 8.8* 8.9 8.3*  MG 1.9  --   --   --   --     Liver Function Tests: Recent Labs  Lab 10/28/23 2135  AST 16  ALT 8  ALKPHOS 150*  BILITOT 0.5  PROT 7.2  ALBUMIN 3.5   No results for input(s): "LIPASE", "AMYLASE" in the last 168 hours. No results for input(s): "AMMONIA" in the last 168 hours.  CBC: Recent Labs  Lab 10/28/23 2135 10/29/23 0237 10/30/23 0245 10/30/23 1957 10/31/23 0641 11/01/23 0710  WBC 6.9 6.2 5.6 7.2 7.6 7.2  NEUTROABS 3.8  --   --   --   --   --   HGB 12.1* 11.1* 11.4* 11.3* 11.0* 11.5*  HCT 38.7* 34.4* 35.4* 35.3* 34.8* 35.9*  MCV 88.6 87.3 86.1 86.1 85.9 86.9  PLT 279 237 252 265 310 283    INR: Recent Labs  Lab 10/28/23 2135 10/29/23 0237 10/30/23 0245 10/31/23 0641 11/01/23 0710  INR 2.1* 2.4* 1.7* 1.3* 1.5*    Other results: EKG:    Imaging   No results found.   Medications:  Scheduled Medications:  buprenorphine-naloxone  1 tablet Sublingual Daily   digoxin  0.125 mg Oral Daily   gabapentin  300 mg Oral TID   ivabradine  5 mg Oral BID WC   losartan  25 mg Oral BID   mupirocin ointment  1 Application Nasal BID   pantoprazole  40 mg Oral Daily   sertraline  25 mg Oral Daily   spironolactone  25 mg Oral Daily   warfarin  1 mg Oral ONCE-1600   Warfarin - Pharmacist Dosing Inpatient   Does not apply q1600   zinc sulfate  220 mg Oral Daily    Infusions:  DAPTOmycin 500 mg (10/31/23 1906)   heparin 500 Units/hr (10/31/23 0943)    PRN Medications: acetaminophen, HYDROmorphone (DILAUDID) injection, HYDROmorphone, hydrOXYzine, ketorolac, mouth rinse, traZODone   Patient Profile  Joel York is a 37 yo male with PMH of 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 Now presenting with driveline and  PICC line infection.    Assessment/Plan:   1. 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 Went for I&D/wound vac change x3. Discharged home with 6 weeks of IV abx.  - Now re-admitted with persistent driveline infection.  - Wound cultures - staph aureus  - Wound culture from 11/1 grew MRSA.   - CT of C/A/P w/ small amount of periprosthetic fluid within the left pleural space adjacent to the proximal portion of the power cord. No other periprosthetic fluid collection identified - ID now following, Abx switched to Daptomycin.  - to OR 11/7 for I&D + wound vac placement  - Tentative plan to return to OR for I&D next Friday   2. PICC line infection - PICC pulled in clinic 10/27/23 - abx per above. ID following  - line holiday. Will discuss timing of PICC placement with ID   3. 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 Echo EF <20%,  RV mildly reduced, - Etiology uncertain. ? 2/2 PVCs vs genetically mediated +/- hypertension.  - LHC: normal coronaries.  - cMRI:  LV markedly dilated EF 10% RV 17% NICM - Underwent HM-III VAD on 07/28/25. Much improved NYHA I-II  - INR 1.5, goal INR 1.5 -2 on warfarin while awaiting driveline site debridement, also low dose heparin.  - Volume status stable.   - Continue digoxin 0.125 mg daily - Continue losartan 25 mg BID - Continue spiro 25 mg daily - Continue Ivabradine 5 BID - MAP elevated this morning, suspect due to pain.    4. 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 Hx drug abuse/Tobacco abuse - reports quit smoking 7/24, cotinine level at 30.1 09/22/23 - Continue suboxone and PRN dilaudid  - Follows with pain clinic   I reviewed the LVAD parameters from today, and compared the results to the patient's prior recorded data.  No programming changes were made.  The LVAD is functioning within specified parameters.  The patient performs LVAD self-test  daily.  LVAD interrogation was negative for any significant power changes, alarms or PI events/speed drops.  LVAD equipment check completed and is in good working order.  Back-up equipment present.   LVAD education done on emergency procedures and precautions and reviewed exit site care.  Length of Stay: 4  Marca Ancona, MD 11/01/2023, 11:49 AM  VAD Team --- VAD ISSUES ONLY--- Pager [phone] (7am - 7am)  Advanced Heart Failure Team  Pager [phone] (M-F; 7a - 5p)  Please contact [facility] for night-coverage after hours (5p -7a ) and weekends on [email or web address]

## 2023-11-01 NOTE — Progress Notes (Signed)
PHARMACY - ANTICOAGULATION CONSULT NOTE  Pharmacy Consult for heparin and warfarin Indication: LVAD  Allergies  Allergen Reactions   Chlorhexidine Dermatitis and Rash   Other Anaphylaxis    Tree Nuts   Peanuts [Peanut Oil] Anaphylaxis    Patient Measurements: Height: 5\' 7"  (170.2 cm) Weight: 69.9 kg (154 lb) IBW/kg (Calculated) : 66.1 Heparin Dosing Weight: 64 kg  Vital Signs: Temp: 97.8 F (36.6 C) (11/09 1224) Temp Source: Oral (11/09 1224) BP: 125/75 (11/09 1224) Pulse Rate: 78 (11/09 0606)  Labs: Recent Labs    10/30/23 0245 10/30/23 1957 10/31/23 0641 10/31/23 1542 11/01/23 0710  HGB 11.4* 11.3* 11.0*  --  11.5*  HCT 35.4* 35.3* 34.8*  --  35.9*  PLT 252 265 310  --  283  LABPROT 20.0*  --  16.7*  --  17.8*  INR 1.7*  --  1.3*  --  1.5*  HEPARINUNFRC  --   --   --  <0.10* <0.10*  CREATININE 0.66 0.83 0.71  --   --   CKTOTAL  --   --  50  --   --     Estimated Creatinine Clearance: 119.3 mL/min (by C-G formula based on SCr of 0.71 mg/dL).   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 Patient had significant bleeding at driveline site on 09/30/23 requiring FFP. PTA Warfarin is on hold. Pharmacy consulted for heparin.   INR is up to 1.3 >1.5. Hgb 11 >11.5, plt 310 >283. LDH stable. No s/sx of bleeding. Discussed with Dr Donata Clay, will start heparin infusion at low dose and plan for warfarin restart - aim for goal 1.5-2 until no longer planning for debridements. Heparin level remains undetectable, <0.1.  Warfarin PTA: 3mg  MWF, 2mg  TTSS but INRs have been subtherapeutic.  Goal of Therapy:  Heparin level <0.3 units/ml - no titration unless directed by MD INR 2- 2.5 long term >> plan for 1.5-2 while  undergoing debridements  Monitor platelets by anticoagulation protocol: Yes   Plan:  Restart heparin gtt at 500 units/hr Order warfarin 1 mg tonight  Monitor daily HL/INR, CBC, and for s/sx of bleeding   Thank you for allowing pharmacy to participate in this patient's care,  Wilmer Floor, PharmD PGY2 Cardiology Pharmacy Resident Phone: [phone] 11/01/2023 12:52 PM  Please check AMION for all [facility] Pharmacy phone numbers After 10:00 PM, call Main Pharmacy [phone]

## 2023-11-01 NOTE — Plan of Care (Signed)
  Problem: Education: Goal: Patient will understand all VAD equipment and how it functions Outcome: Progressing Goal: Patient will be able to verbalize current INR target range and antiplatelet therapy for discharge home Outcome: Progressing

## 2023-11-02 ENCOUNTER — Other Ambulatory Visit: Payer: Self-pay

## 2023-11-02 DIAGNOSIS — T827XXA Infection and inflammatory reaction due to other cardiac and vascular devices, implants and grafts, initial encounter: Secondary | ICD-10-CM | POA: Diagnosis not present

## 2023-11-02 DIAGNOSIS — Z95811 Presence of heart assist device: Secondary | ICD-10-CM | POA: Diagnosis not present

## 2023-11-02 LAB — CBC
HCT: 35.5 % — ABNORMAL LOW (ref 39.0–52.0)
Hemoglobin: 11.6 g/dL — ABNORMAL LOW (ref 13.0–17.0)
MCH: 28.3 pg (ref 26.0–34.0)
MCHC: 32.7 g/dL (ref 30.0–36.0)
MCV: 86.6 fL (ref 80.0–100.0)
Platelets: 340 10*3/uL (ref 150–400)
RBC: 4.1 MIL/uL — ABNORMAL LOW (ref 4.22–5.81)
RDW: 13.7 % (ref 11.5–15.5)
WBC: 8 10*3/uL (ref 4.0–10.5)
nRBC: 0 % (ref 0.0–0.2)

## 2023-11-02 LAB — PROTIME-INR
INR: 1.4 — ABNORMAL HIGH (ref 0.8–1.2)
Prothrombin Time: 16.9 s — ABNORMAL HIGH (ref 11.4–15.2)

## 2023-11-02 LAB — HEPARIN LEVEL (UNFRACTIONATED): Heparin Unfractionated: <0.1 [IU]/mL — ABNORMAL LOW (ref 0.30–0.70)

## 2023-11-02 MED ORDER — WARFARIN SODIUM 1 MG PO TABS
1.0000 mg | ORAL_TABLET | Freq: Once | ORAL | Status: AC
  Administered 2023-11-02: 1 mg via ORAL
  Filled 2023-11-02: qty 1

## 2023-11-02 MED ORDER — SODIUM CHLORIDE 0.9% FLUSH
10.0000 mL | Freq: Two times a day (BID) | INTRAVENOUS | Status: DC
  Administered 2023-11-03 – 2023-11-11 (×17): 10 mL
  Administered 2023-11-12 (×2): 20 mL
  Administered 2023-11-13: 30 mL

## 2023-11-02 MED ORDER — SODIUM CHLORIDE 0.9% FLUSH: 10.0000 mL | INTRAVENOUS | Status: DC | PRN

## 2023-11-02 MED ORDER — SODIUM CHLORIDE 0.9 % IV SOLN
2.0000 g | Freq: Three times a day (TID) | INTRAVENOUS | Status: DC
  Administered 2023-11-02 – 2023-11-11 (×27): 2 g via INTRAVENOUS
  Filled 2023-11-02 (×27): qty 12.5

## 2023-11-02 MED ORDER — LOSARTAN POTASSIUM 50 MG PO TABS
50.0000 mg | ORAL_TABLET | Freq: Two times a day (BID) | ORAL | Status: DC
  Administered 2023-11-02 – 2023-11-04 (×4): 50 mg via ORAL
  Filled 2023-11-02 (×4): qty 1

## 2023-11-02 NOTE — Progress Notes (Signed)
Peripherally Inserted Central Catheter Placement  The IV Nurse has discussed with the patient and/or persons authorized to consent for the patient, the purpose of this procedure and the potential benefits and risks involved with this procedure.  The benefits include less needle sticks, lab draws from the catheter, and the patient may be discharged home with the catheter. Risks include, but not limited to, infection, bleeding, blood clot (thrombus formation), and puncture of an artery; nerve damage and irregular heartbeat and possibility to perform a PICC exchange if needed/ordered by physician.  Alternatives to this procedure were also discussed.  Bard Power PICC patient education guide, fact sheet on infection prevention and patient information card has been provided to patient /or left at bedside.    PICC Placement Documentation  PICC Double Lumen 11/02/23 Left Brachial 41 cm 1 cm (Active)  Indication for Insertion or Continuance of Line Chronic illness with exacerbations (CF, Sickle Cell, etc.) 11/02/23 1814  Exposed Catheter (cm) 1 cm 11/02/23 1814  Site Assessment Clean, Dry, Intact 11/02/23 1814  Lumen #1 Status Flushed;Saline locked;Blood return noted 11/02/23 1814  Lumen #2 Status Flushed;Saline locked;Blood return noted 11/02/23 1814  Dressing Type Transparent;Securing device 11/02/23 1814  Dressing Status Antimicrobial disc in place;Clean, Dry, Intact 11/02/23 1814  Line Care Connections checked and tightened 11/02/23 1814  Line Adjustment (NICU/IV Team Only) No 11/02/23 1814  Dressing Intervention New dressing;Adhesive placed at insertion site (IV team only);Adhesive placed around edges of dressing (IV team/ICU RN only) 11/02/23 1814  Dressing Change Due 11/09/23 11/02/23 1814       Joel York 11/02/2023, 6:14 PM

## 2023-11-02 NOTE — Progress Notes (Signed)
Pt refused am labs. Will attempt again later in the am.

## 2023-11-02 NOTE — Progress Notes (Signed)
3 Days Post-Op Procedure(s) (LRB): DEBRIDEMENT OF VAD TUNNEL (N/A) APPLICATION OF VERAFLO WOUND VAC (N/A) Subjective: Patient examined and initial results of operative cultures reviewed- Staph aureus and enterobacter Wound VAC working well with min drainage and normal seal check Objective: Vital signs in last 24 hours: Temp:  [97.6 F (36.4 C)-98.4 F (36.9 C)] 97.8 F (36.6 C) (11/10 1134) Pulse Rate:  [76-96] 76 (11/10 1134) Cardiac Rhythm: Normal sinus rhythm (11/10 0812) Resp:  [11-12] 11 (11/10 0403) BP: (114-127)/(76-98) 115/98 (11/10 1134) SpO2:  [97 %-100 %] 98 % (11/10 1134) Weight:  [69.2 kg] 69.2 kg (11/10 0408)  Hemodynamic parameters for last 24 hours:    Intake/Output from previous day: No intake/output data recorded. Intake/Output this shift: Total I/O In: -  Out: 800 [Urine:800]  EXAM  Up in chair Pain still an issue but better VAC dressing intact Lab Results: Recent Labs    11/01/23 0710 11/02/23 1211  WBC 7.2 8.0  HGB 11.5* 11.6*  HCT 35.9* 35.5*  PLT 283 340   BMET:  Recent Labs    10/30/23 1957 10/31/23 0641  NA 137 137  K 3.8 4.4  CL 102 98  CO2 27 27  GLUCOSE 154* 160*  BUN 11 12  CREATININE 0.83 0.71  CALCIUM 8.9 8.3*    PT/INR:  Recent Labs    11/02/23 1211  LABPROT 16.9*  INR 1.4*   ABG    Component Value Date/Time   PHART 7.510 (H) 08/03/2023 0445   HCO3 27.3 08/03/2023 0445   TCO2 28 08/03/2023 0445   ACIDBASEDEF 2.0 07/31/2023 1704   O2SAT 68.1 08/08/2023 0535   CBG (last 3)  No results for input(s): "GLUCAP" in the last 72 hours.  Assessment/Plan: S/P Procedure(s) (LRB): DEBRIDEMENT OF VAD TUNNEL (N/A) APPLICATION OF VERAFLO WOUND VAC (N/A) Add iv Maxepine Patient needs a new PICC  Cont slow coumadin load  LOS: 5 days    Lovett Sox 11/02/2023

## 2023-11-02 NOTE — Progress Notes (Signed)
Patient refusing doppler at this time, patient wanting to rest. VSS bp 121/87 map 99. Will try again this morning.

## 2023-11-02 NOTE — Progress Notes (Signed)
BRIEF ID NOTE   Afebrile, WBC WNL. Last wash out showing rare staph aureus ( hx of MRSA) on daptomycin and also rare GNR (to be identified)  Agree with plan to start cefepime in addition to daptomycin Will follow culture results to see if can narrow  Plan for repeat debridement next week Will likely need long course of IV abtx and repeat CT to follow periprosthetic fluid collectin within the left pleural space.  Duke Salvia Drue Second MD MPH [facility] [phone]

## 2023-11-02 NOTE — Progress Notes (Signed)
Patient ID: Joel York, male   DOB: 15-Aug-1986, 37 y.o.   MRN: [id number]   Advanced Heart Failure VAD Team Note  PCP-Cardiologist: None   Subjective:    Admitted with recurrent driveline infection and PICC site infection w/ RUE cellulitis. Wound culture from 11/1 grew MRSA.  11/7 I&D driveline exit site, wound culture with S aureus and GNRs (not yet speciated).   CT of C/A/P w/ small amount of periprosthetic fluid within the left pleural space adjacent to the proximal portion of the power cord. No other periprosthetic fluid collection identified  ID now following, on Daptomycin.   Afebrile. Ongoing driveline site pain, controlled with Dilaudid.   Refused am labs.    Flow 3.6  liters/min, speed 5600, power 4,  PI 5.6.    Objective:    Vital Signs:   Temp:  [97.6 F (36.4 C)-98.4 F (36.9 C)] 98 F (36.7 C) (11/10 0739) Pulse Rate:  [92-96] 96 (11/10 0739) Resp:  [11-12] 11 (11/10 0403) BP: (114-127)/(75-97) 121/87 (11/10 0739) SpO2:  [97 %-100 %] 100 % (11/10 0739) Weight:  [69.2 kg] 69.2 kg (11/10 0408) Last BM Date : 10/31/23 Mean arterial Pressure 90s-100s  Intake/Output:   Intake/Output Summary (Last 24 hours) at 11/02/2023 1127 Last data filed at 11/02/2023 0800 Gross per 24 hour  Intake --  Output 0 ml  Net 0 ml     Physical Exam    General: Well appearing this am. NAD.  HEENT: Normal. Neck: Supple, JVP 7-8 cm. Carotids OK.  Cardiac:  Mechanical heart sounds with LVAD hum present.  Lungs:  CTAB, normal effort.  Abdomen:  NT, ND, no HSM. No bruits or masses. +BS  LVAD exit site: Wound vac at LVAD driveline site.  Extremities:  Warm and dry. No cyanosis, clubbing, rash, or edema.  Neuro:  Alert & oriented x 3. Cranial nerves grossly intact. Moves all 4 extremities w/o difficulty. Affect pleasant     Telemetry   NSR, personally reviewed  EKG    N/A  Labs   Basic Metabolic Panel: Recent Labs  Lab 10/28/23 2135 10/29/23 0237 10/30/23 0245  10/30/23 1957 10/31/23 0641  NA 141 139 138 137 137  K 3.4* 4.0 3.8 3.8 4.4  CL 103 103 102 102 98  CO2 27 28 26 27 27   GLUCOSE 94 96 127* 154* 160*  BUN 11 11 10 11 12   CREATININE 0.76 0.79 0.66 0.83 0.71  CALCIUM 9.0 8.6* 8.8* 8.9 8.3*  MG 1.9  --   --   --   --     Liver Function Tests: Recent Labs  Lab 10/28/23 2135  AST 16  ALT 8  ALKPHOS 150*  BILITOT 0.5  PROT 7.2  ALBUMIN 3.5   No results for input(s): "LIPASE", "AMYLASE" in the last 168 hours. No results for input(s): "AMMONIA" in the last 168 hours.  CBC: Recent Labs  Lab 10/28/23 2135 10/29/23 0237 10/30/23 0245 10/30/23 1957 10/31/23 0641 11/01/23 0710  WBC 6.9 6.2 5.6 7.2 7.6 7.2  NEUTROABS 3.8  --   --   --   --   --   HGB 12.1* 11.1* 11.4* 11.3* 11.0* 11.5*  HCT 38.7* 34.4* 35.4* 35.3* 34.8* 35.9*  MCV 88.6 87.3 86.1 86.1 85.9 86.9  PLT 279 237 252 265 310 283    INR: Recent Labs  Lab 10/28/23 2135 10/29/23 0237 10/30/23 0245 10/31/23 0641 11/01/23 0710  INR 2.1* 2.4* 1.7* 1.3* 1.5*    Other results: EKG:  Imaging   No results found.   Medications:     Scheduled Medications:  buprenorphine-naloxone  1 tablet Sublingual Daily   digoxin  0.125 mg Oral Daily   gabapentin  300 mg Oral TID   ivabradine  5 mg Oral BID WC   losartan  50 mg Oral BID   mupirocin ointment  1 Application Nasal BID   pantoprazole  40 mg Oral Daily   sertraline  25 mg Oral Daily   spironolactone  25 mg Oral Daily   warfarin  1 mg Oral ONCE-1600   Warfarin - Pharmacist Dosing Inpatient   Does not apply q1600   zinc sulfate  220 mg Oral Daily    Infusions:  DAPTOmycin 500 mg (11/01/23 1731)   heparin 500 Units/hr (11/02/23 0840)    PRN Medications: acetaminophen, HYDROmorphone (DILAUDID) injection, HYDROmorphone, hydrOXYzine, mouth rinse, traZODone   Patient Profile  Joel York is a 37 yo male with PMH of 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 Now presenting with driveline and PICC line infection.    Assessment/Plan:   1. 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 Went for I&D/wound vac change x3. Discharged home with 6 weeks of IV abx.  - Now re-admitted with persistent driveline infection.  - Wound culture from 11/1 grew MRSA.   - Wound culture from 11/7 with S aureus and GNRs (not yet speciated). - CT of C/A/P w/ small amount of periprosthetic fluid within the left pleural space adjacent to the proximal portion of the power cord. No other periprosthetic fluid collection identified - ID now following, on Daptomycin.  - Will add cefepime to cover GNR from 11/7 wound culture until speciated.  - Tentative plan to return to OR for I&D Friday   2. PICC line infection - PICC pulled in clinic 10/27/23 - abx per above. ID following  - line holiday. Will discuss timing of PICC placement with ID   3. 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 Echo EF <20%,  RV mildly reduced, - Etiology uncertain. ? 2/2 PVCs vs genetically mediated +/- hypertension.  - LHC: normal coronaries.  - cMRI:  LV markedly dilated EF 10% RV 17% NICM - Underwent HM-III VAD on 07/28/25. Much improved NYHA I-II  - INR pending today (refused labs), goal INR 1.5 -2 on warfarin while awaiting driveline site debridement, also low dose heparin.  - Volume status stable.   - Continue digoxin 0.125 mg daily - Increase losartan to 50 mg bid with elevated MAP.  BMET tomorrow.  - Continue spiro 25 mg daily - Continue Ivabradine 5 BID   4. 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 Hx drug abuse/Tobacco abuse - reports quit smoking 7/24, cotinine level at 30.1 09/22/23 - Continue suboxone and PRN dilaudid  - Follows with pain clinic   I reviewed the LVAD parameters from today, and compared the results to the patient's prior recorded data.  No  programming changes were made.  The LVAD is functioning within specified parameters.  The patient performs LVAD self-test daily.  LVAD interrogation was negative for any significant power changes, alarms or PI events/speed drops.  LVAD equipment check completed and is in good working order.  Back-up equipment present.   LVAD education done on emergency procedures and precautions and reviewed exit site care.  Length of Stay: 5  Marca Ancona, MD 11/02/2023, 11:27 AM  VAD Team --- VAD ISSUES ONLY--- Pager [phone] (7am - 7am)  Advanced Heart Failure Team  Pager [phone] (M-F; 7a - 5p)  Please contact [facility] for night-coverage after hours (5p -7a ) and weekends on [email or web address]

## 2023-11-02 NOTE — Progress Notes (Signed)
PHARMACY - ANTICOAGULATION CONSULT NOTE  Pharmacy Consult for heparin and warfarin Indication: LVAD  Allergies  Allergen Reactions   Chlorhexidine Dermatitis and Rash   Other Anaphylaxis    Tree Nuts   Peanuts [Peanut Oil] Anaphylaxis    Patient Measurements: Height: 5\' 7"  (170.2 cm) Weight: 69.2 kg (152 lb 9.6 oz) IBW/kg (Calculated) : 66.1 Heparin Dosing Weight: 64 kg  Vital Signs: Temp: 98.4 F (36.9 C) (11/10 0403) Temp Source: Oral (11/09 2341) BP: 126/90 (11/10 0403) Pulse Rate: 92 (11/10 0403)  Labs: Recent Labs    10/30/23 1957 10/31/23 0641 10/31/23 1542 11/01/23 0710  HGB 11.3* 11.0*  --  11.5*  HCT 35.3* 34.8*  --  35.9*  PLT 265 310  --  283  LABPROT  --  16.7*  --  17.8*  INR  --  1.3*  --  1.5*  HEPARINUNFRC  --   --  <0.10* <0.10*  CREATININE 0.83 0.71  --   --   CKTOTAL  --  50  --   --     Estimated Creatinine Clearance: 119.3 mL/min (by C-G formula based on SCr of 0.71 mg/dL).   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 Patient had significant bleeding at driveline site on 09/30/23 requiring FFP. PTA Warfarin is on hold. Pharmacy consulted for heparin.   INR is up to 1.3 >1.5. Hgb 11 >11.5, plt 310 >283. LDH stable. No s/sx of bleeding. Discussed with Dr Donata Clay, will start heparin infusion at low dose and plan for warfarin restart - aim for goal 1.5-2 until no longer planning for debridements. Heparin level remains undetectable, <0.1.  11/10 AM: patient refused labs. Attempting to get them later today.  Warfarin PTA: 3mg  MWF, 2mg  TTSS but INRs have been subtherapeutic.  Goal of Therapy:  Heparin level <0.3 units/ml - no titration unless directed by MD INR 2- 2.5 long term >> plan  for 1.5-2 while undergoing debridements  Monitor platelets by anticoagulation protocol: Yes   Plan:  Continue heparin gtt at 500 units/hr Will order warfarin 1 mg tonight  Monitor daily HL/INR, CBC, and for s/sx of bleeding   Thank you for allowing pharmacy to participate in this patient's care,  Wilmer Floor, PharmD PGY2 Cardiology Pharmacy Resident Phone: [phone] 11/02/2023 6:17 AM  Please check AMION for all [facility] Pharmacy phone numbers After 10:00 PM, call Main Pharmacy [phone]

## 2023-11-03 DIAGNOSIS — T827XXA Infection and inflammatory reaction due to other cardiac and vascular devices, implants and grafts, initial encounter: Secondary | ICD-10-CM | POA: Diagnosis not present

## 2023-11-03 LAB — PROTIME-INR
INR: 1.5 — ABNORMAL HIGH (ref 0.8–1.2)
Prothrombin Time: 18.1 s — ABNORMAL HIGH (ref 11.4–15.2)

## 2023-11-03 LAB — HEPARIN LEVEL (UNFRACTIONATED): Heparin Unfractionated: <0.1 [IU]/mL — ABNORMAL LOW (ref 0.30–0.70)

## 2023-11-03 LAB — LACTATE DEHYDROGENASE: LDH: 146 U/L (ref 98–192)

## 2023-11-03 LAB — CBC
HCT: 32.3 % — ABNORMAL LOW (ref 39.0–52.0)
Hemoglobin: 10.1 g/dL — ABNORMAL LOW (ref 13.0–17.0)
MCH: 27.1 pg (ref 26.0–34.0)
MCHC: 31.3 g/dL (ref 30.0–36.0)
MCV: 86.6 fL (ref 80.0–100.0)
Platelets: 247 10*3/uL (ref 150–400)
RBC: 3.73 MIL/uL — ABNORMAL LOW (ref 4.22–5.81)
RDW: 13.9 % (ref 11.5–15.5)
WBC: 5.7 10*3/uL (ref 4.0–10.5)
nRBC: 0 % (ref 0.0–0.2)

## 2023-11-03 LAB — AEROBIC CULTURE W GRAM STAIN (SUPERFICIAL SPECIMEN)

## 2023-11-03 LAB — CULTURE, BLOOD (ROUTINE X 2)
Culture: NO GROWTH
Culture: NO GROWTH
Special Requests: ADEQUATE

## 2023-11-03 LAB — BASIC METABOLIC PANEL
Anion gap: 2 — ABNORMAL LOW (ref 5–15)
BUN: 8 mg/dL (ref 6–20)
CO2: 23 mmol/L (ref 22–32)
Calcium: 7.5 mg/dL — ABNORMAL LOW (ref 8.9–10.3)
Chloride: 112 mmol/L — ABNORMAL HIGH (ref 98–111)
Creatinine, Ser: 0.62 mg/dL (ref 0.61–1.24)
GFR, Estimated: >60 mL/min (ref >60)
Glucose, Bld: 114 mg/dL — ABNORMAL HIGH (ref 70–99)
Potassium: 3.1 mmol/L — ABNORMAL LOW (ref 3.5–5.1)
Sodium: 137 mmol/L (ref 135–145)

## 2023-11-03 MED ORDER — WARFARIN SODIUM 2 MG PO TABS
2.0000 mg | ORAL_TABLET | Freq: Once | ORAL | Status: AC
Start: 2023-11-03 — End: 2023-11-03
  Administered 2023-11-03: 2 mg via ORAL
  Filled 2023-11-03: qty 1

## 2023-11-03 MED ORDER — POTASSIUM CHLORIDE CRYS ER 20 MEQ PO TBCR
40.0000 meq | EXTENDED_RELEASE_TABLET | Freq: Once | ORAL | Status: AC
  Administered 2023-11-03: 40 meq via ORAL
  Filled 2023-11-03: qty 2

## 2023-11-03 MED ORDER — KETOROLAC TROMETHAMINE 10 MG PO TABS
10.0000 mg | ORAL_TABLET | Freq: Four times a day (QID) | ORAL | Status: DC | PRN
  Administered 2023-11-03: 10 mg via ORAL
  Filled 2023-11-03 (×2): qty 1

## 2023-11-03 NOTE — Progress Notes (Signed)
PHARMACY - ANTICOAGULATION CONSULT NOTE  Pharmacy Consult for heparin and warfarin Indication: LVAD  Allergies  Allergen Reactions   Chlorhexidine Dermatitis and Rash   Other Anaphylaxis    Tree Nuts   Peanuts [Peanut Oil] Anaphylaxis    Patient Measurements: Height: 5\' 7"  (170.2 cm) Weight: 68.5 kg (151 lb 0.2 oz) IBW/kg (Calculated) : 66.1 Heparin Dosing Weight: 64 kg  Vital Signs: Temp: 97.9 F (36.6 C) (11/11 1134) Temp Source: Oral (11/11 1134) BP: 104/78 (11/11 1134) Pulse Rate: 76 (11/11 1134)  Labs: Recent Labs    11/01/23 0710 11/02/23 1211 11/03/23 0523 11/03/23 0525  HGB 11.5* 11.6* 10.1*  --   HCT 35.9* 35.5* 32.3*  --   PLT 283 340 247  --   LABPROT 17.8* 16.9* 18.1*  --   INR 1.5* 1.4* 1.5*  --   HEPARINUNFRC <0.10* <0.10*  --  <0.10*  CREATININE  --   --  0.62  --     Estimated Creatinine Clearance: 119.3 mL/min (by C-G formula based on SCr of 0.62 mg/dL).   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 Patient had significant bleeding at driveline site on 09/30/23 requiring FFP. PTA Warfarin is on hold. Pharmacy consulted for heparin.   INR is up to 1.5. Hgb 10.1, plt 310 >247. LDH stable. No s/sx of bleeding. Discussed with Dr Donata Clay, will start heparin infusion at low dose and plan for warfarin restart - aim for goal 1.5-2 until no longer planning for debridements. Heparin level remains undetectable, <0.1 as intended.  Warfarin PTA: 3mg  MWF, 2mg  TTSS but INRs have been subtherapeutic.  Goal of Therapy:  Heparin level <0.3 units/ml - no titration unless directed by MD INR 2- 2.5 long term >> plan for 1.5-2 while undergoing debridements  Monitor platelets by anticoagulation protocol: Yes    Plan:  Continue heparin gtt at 500 units/hr Will order warfarin 2 mg tonight  Monitor daily HL/INR, CBC, and for s/sx of bleeding   Thank you for allowing pharmacy to participate in this patient's care.  Reece Leader, Colon Flattery, BCCP Clinical Pharmacist  11/03/2023 1:18 PM   [facility] pharmacy phone numbers are listed on [email or web address]

## 2023-11-03 NOTE — Progress Notes (Signed)
LVAD Coordinator Rounding Note:  Admitted 10/28/23 to heart failure service due to worsening driveline infection. Pt seen in VAD clinic 11/4- wound looked progressively worse. Decision was made to admit for further IV antibiotics and debridement.   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  Pt laying in bed. Reports pain at wound vac site. Pt states it has improved over the weekend. IV pain medication weaned over the weekend to transition to po. Discussed with pt at bedside.    Rash scabbed over on arms, neck, and back. Rash still present on abdomen but pt states this is less painful/itchy.   ID consulted. Cultures positive for Staph Aureus and Enterobacter Cloacae as of 10/30/23. Currently receiving Cefepime 2g q8h along with Cefazolin 2g q8h.  Plan for wound debridement and wound vac placement in OR next Friday 11/22 per Dr Donata Clay.   Vital signs: Temp: 97.8 HR: 89 Doppler Pressure: 90 Auto BP: 109/94 (101) O2 Sat: 98% on RA Wt: 141.1>141.5>145.9>150.2>154>152.6 >151lbs    LVAD interrogation reveals:  Speed: 5600 Flow: 3.7 Power: 4.4 w PI: 5.3  Alarms: none Events: rare Hematocrit: 37  Fixed speed: 5600 Low speed limit: 5300  Drive Line: Wound vac dressing clean, dry, intact. Negative pressure -125. Minimal serosanguinous drainage in canister. Plan for wound vac change in OR Friday 11/15 with Dr Donata Clay.   Labs:  LDH trend: 133>134>124>146  INR trend: 2.4>1.7>1.3>1.5>1.4>1.5  WBC trend: 6.2>5.6>7.6>7.2>8.0>5.7  Anticoagulation Plan: -INR Goal: 2-2.5 -ASA Dose: off  Blood Products:  10/29/23>> 2 FFP  Infection:  10/24/23>>wound cx>>STAPH AUREUS; final 10/29/23>>blood cultures>> no growth 5 days 10/30/23>> wound cx>>RARE STAPHYLOCOCCUS AUREUS  RARE ENTEROBACTER CLOACAE; pending   Drips:  Heparin 500 units/hr  Adverse Events on VAD: - Admitted 09/29/23 with DL infection. Wound cx + staph aureus. Underwent I&D/wound vac change x3. ID saw,  discharged home on 6 weeks of IV cefazolin (end date 11/18/23).   Plan/Recommendations:  1. Please page VAD coordinator for any alarms or VAD equipment issues. 2. Page VAD coordinator for wound vac issues 3. VAD coordinator will accompany pt to OR Friday 11/22  Simmie Davies RN,BSN VAD Coordinator  Office: [phone]  24/7 Pager: [phone]

## 2023-11-03 NOTE — Progress Notes (Addendum)
Patient ID: Joel York, male   DOB: 1986-10-27, 37 y.o.   MRN: [id number]   Advanced Heart Failure VAD Team Note  PCP-Cardiologist: None   Subjective:    Admitted 11/05 w/ recurrent driveline infection and PICC site infection w/ RUE cellulitis. Wound culture 11/1 grew MRSA.  11/7 I&D driveline exit site, wound culture with S aureus and Enterobacter cloacae.   CT C/A/P w/ small amount of periprosthetic fluid within the left pleural space adjacent to the proximal portion of the power cord.   Remains on IV Daptomycin and IV Cefepime (added 11/10).   MAP better this am after increasing losartan yesterday.   Pain controlled. No fevers or chills. Good appetite.   VAD interrogation: Flow 4.0  liters/min, speed 5600, power 4,  PI 2.9.  No alarms.  Objective:    Vital Signs:   Temp:  [97.6 F (36.4 C)-98.5 F (36.9 C)] 97.6 F (36.4 C) (11/10 2312) Pulse Rate:  [76-96] 85 (11/10 2312) Resp:  [17] 17 (11/10 2312) BP: (97-122)/(76-98) 97/76 (11/11 0511) SpO2:  [98 %-100 %] 99 % (11/10 2312) Last BM Date : 10/31/23 Mean arterial Pressure 100s >> improving to 80s-90s  Intake/Output:   Intake/Output Summary (Last 24 hours) at 11/03/2023 0706 Last data filed at 11/03/2023 0514 Gross per 24 hour  Intake 440 ml  Output 1675 ml  Net -1235 ml     Physical Exam    Physical Exam: GENERAL: Well appearing. HEENT: normal  NECK: Supple, JVP not elevated.   CARDIAC:  Mechanical heart sounds with LVAD hum present.  LUNGS:  Clear to auscultation bilaterally.  ABDOMEN:  Soft, round, nontender, positive bowel sounds x4.     LVAD exit site:   Wound VAC at driveline stie.  EXTREMITIES:  Warm and dry, no cyanosis, clubbing, rash or edema, LUE PICC line NEUROLOGIC:  Alert and oriented x 4.  No aphasia.  No dysarthria.  Affect pleasant.       Telemetry   NSR 70s-80s  EKG    N/A  Labs   Basic Metabolic Panel: Recent Labs  Lab 10/28/23 2135 10/29/23 0237 10/30/23 0245  10/30/23 1957 10/31/23 0641 11/03/23 0523  NA 141 139 138 137 137 137  K 3.4* 4.0 3.8 3.8 4.4 3.1*  CL 103 103 102 102 98 112*  CO2 27 28 26 27 27 23   GLUCOSE 94 96 127* 154* 160* 114*  BUN 11 11 10 11 12 8   CREATININE 0.76 0.79 0.66 0.83 0.71 0.62  CALCIUM 9.0 8.6* 8.8* 8.9 8.3* 7.5*  MG 1.9  --   --   --   --   --     Liver Function Tests: Recent Labs  Lab 10/28/23 2135  AST 16  ALT 8  ALKPHOS 150*  BILITOT 0.5  PROT 7.2  ALBUMIN 3.5   No results for input(s): "LIPASE", "AMYLASE" in the last 168 hours. No results for input(s): "AMMONIA" in the last 168 hours.  CBC: Recent Labs  Lab 10/28/23 2135 10/29/23 0237 10/30/23 1957 10/31/23 0641 11/01/23 0710 11/02/23 1211 11/03/23 0523  WBC 6.9   < > 7.2 7.6 7.2 8.0 5.7  NEUTROABS 3.8  --   --   --   --   --   --   HGB 12.1*   < > 11.3* 11.0* 11.5* 11.6* 10.1*  HCT 38.7*   < > 35.3* 34.8* 35.9* 35.5* 32.3*  MCV 88.6   < > 86.1 85.9 86.9 86.6 86.6  PLT 279   < >  265 310 283 340 247   < > = values in this interval not displayed.    INR: Recent Labs  Lab 10/30/23 0245 10/31/23 0641 11/01/23 0710 11/02/23 1211 11/03/23 0523  INR 1.7* 1.3* 1.5* 1.4* 1.5*    Other results: EKG:    Imaging   [place] EKG SITE RITE  Result Date: 11/02/2023 If Site Rite image not attached, placement could not be confirmed due to current cardiac rhythm.    Medications:     Scheduled Medications:  buprenorphine-naloxone  1 tablet Sublingual Daily   digoxin  0.125 mg Oral Daily   gabapentin  300 mg Oral TID   ivabradine  5 mg Oral BID WC   losartan  50 mg Oral BID   mupirocin ointment  1 Application Nasal BID   pantoprazole  40 mg Oral Daily   sertraline  25 mg Oral Daily   sodium chloride flush  10-40 mL Intracatheter Q12H   spironolactone  25 mg Oral Daily   Warfarin - Pharmacist Dosing Inpatient   Does not apply q1600   zinc sulfate  220 mg Oral Daily    Infusions:  ceFEPime (MAXIPIME) IV 2 g (11/03/23 0458)    DAPTOmycin 500 mg (11/02/23 1933)   heparin 500 Units/hr (11/02/23 0840)    PRN Medications: acetaminophen, HYDROmorphone (DILAUDID) injection, HYDROmorphone, hydrOXYzine, mouth rinse, sodium chloride flush, traZODone   Patient Profile  Joel York is a 37 yo male with PMH of 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 Now presenting with driveline and PICC line infection.    Assessment/Plan:   1. 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 Went for I&D/wound vac change x3. Discharged home with 6 weeks of IV abx.  - Now re-admitted with persistent driveline infection.  - Wound culture from 11/1 grew MRSA.   - Wound culture from 11/7 with S. aureus and Enterobacter cloacae - CT of C/A/P w/ small amount of periprosthetic fluid within the left pleural space adjacent to the proximal portion of the power cord.  - ID following. On IV Daptomycin + IV cefepime. PICC replaced 11/02/23. Will likely require long duration of IV antibiotics. - Tentative plan to return to OR for I&D 11/15.   2. PICC line infection - PICC pulled in clinic 10/27/23 - abx per above. ID following  - PICC replaced 11/02/23   3. 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 Echo EF <20%,  RV mildly reduced, - Etiology uncertain. ? 2/2 PVCs vs genetically mediated +/- hypertension.  - LHC: normal coronaries.  - cMRI:  LV markedly dilated EF 10% RV 17% NICM - Underwent HM-III VAD on 07/28/25. Much improved NYHA I-II  - INR 1.5, goal INR 1.5 -2 on warfarin while awaiting driveline site debridement, also low dose heparin.  - Volume status stable.   - Continue digoxin 0.125 mg daily - BP improving after increasing Losartan yesterday, continue 50 mg BID.  - Continue spiro 25 mg daily - Continue Ivabradine 5 BID   4. 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  Hx drug abuse/Tobacco abuse - reports quit smoking 7/24, cotinine level at 30.1 09/22/23 - Continue suboxone and PRN dilaudid  - Follows with pain clinic  6. Hypokalemia - K 3.1 - Supp today   I reviewed the LVAD parameters from today, and compared the results to the patient's  prior recorded data.  No programming changes were made.  The LVAD is functioning within specified parameters.  The patient performs LVAD self-test daily.  LVAD interrogation was negative for any significant power changes, alarms or PI events/speed drops.  LVAD equipment check completed and is in good working order.  Back-up equipment present.   LVAD education done on emergency procedures and precautions and reviewed exit site care.  Length of Stay: 6  [name], [name], PA-C 11/03/2023, 7:06 AM  VAD Team --- VAD ISSUES ONLY--- Pager [phone] (7am - 7am)  Advanced Heart Failure Team  Pager [phone] (M-F; 7a - 5p)  Please contact [facility] for night-coverage after hours (5p -7a ) and weekends on [email or web address]  Patient seen and examined with the above-signed Advanced Practice Provider and/or Housestaff. I personally reviewed laboratory data, imaging studies and relevant notes. I independently examined the patient and formulated the important aspects of the plan. I have edited the note to reflect any of my changes or salient points. I have personally discussed the plan with the patient and/or family.  Remains on IV abx. Afebrile. Wound vac working well. C/o pain at wound site.  INR 1.5 on heparin. No bleeding  General:  NAD.  HEENT: normal  Neck: supple. JVP not elevated.  Carotids 2+ bilat; no bruits. No lymphadenopathy or thryomegaly appreciated. Cor: LVAD hum.  Lungs: Clear. Abdomen: soft, nontender, non-distended. No hepatosplenomegaly. No bruits or masses. Good bowel sounds. Driveline site with wound vac . Anchor in place.  Extremities: no cyanosis, clubbing, rash. Warm no edema  Neuro: alert & oriented x  3. No focal deficits. Moves all 4 without problem   Remains afebrile. Wound vac working well. Continue IV abx. Continue heparin. Discussed warfarin dosing with PharmD personally.  VAD interrogated personally. Parameters stable.  Pain regimen discussed with PharmD.   Back to OR on Friday.   [name], MD  4:24 PM

## 2023-11-03 NOTE — Progress Notes (Signed)
Pt refused weight this am. Will hand off to day shift.

## 2023-11-04 DIAGNOSIS — T827XXA Infection and inflammatory reaction due to other cardiac and vascular devices, implants and grafts, initial encounter: Secondary | ICD-10-CM | POA: Diagnosis not present

## 2023-11-04 LAB — CBC
HCT: 32 % — ABNORMAL LOW (ref 39.0–52.0)
Hemoglobin: 10.2 g/dL — ABNORMAL LOW (ref 13.0–17.0)
MCH: 27.8 pg (ref 26.0–34.0)
MCHC: 31.9 g/dL (ref 30.0–36.0)
MCV: 87.2 fL (ref 80.0–100.0)
Platelets: 228 10*3/uL (ref 150–400)
RBC: 3.67 MIL/uL — ABNORMAL LOW (ref 4.22–5.81)
RDW: 14 % (ref 11.5–15.5)
WBC: 5.6 10*3/uL (ref 4.0–10.5)
nRBC: 0 % (ref 0.0–0.2)

## 2023-11-04 LAB — BASIC METABOLIC PANEL
Anion gap: <0 — ABNORMAL LOW (ref 5–15)
BUN: 9 mg/dL (ref 6–20)
CO2: 23 mmol/L (ref 22–32)
Calcium: 7.7 mg/dL — ABNORMAL LOW (ref 8.9–10.3)
Chloride: 113 mmol/L — ABNORMAL HIGH (ref 98–111)
Creatinine, Ser: 0.69 mg/dL (ref 0.61–1.24)
GFR, Estimated: >60 mL/min (ref >60)
Glucose, Bld: 107 mg/dL — ABNORMAL HIGH (ref 70–99)
Potassium: 3.4 mmol/L — ABNORMAL LOW (ref 3.5–5.1)
Sodium: 135 mmol/L (ref 135–145)

## 2023-11-04 LAB — AEROBIC/ANAEROBIC CULTURE W GRAM STAIN (SURGICAL/DEEP WOUND): Gram Stain: NONE SEEN

## 2023-11-04 LAB — HEPARIN LEVEL (UNFRACTIONATED): Heparin Unfractionated: <0.1 [IU]/mL — ABNORMAL LOW (ref 0.30–0.70)

## 2023-11-04 LAB — PROTIME-INR
INR: 1.5 — ABNORMAL HIGH (ref 0.8–1.2)
Prothrombin Time: 18.7 s — ABNORMAL HIGH (ref 11.4–15.2)

## 2023-11-04 MED ORDER — POTASSIUM CHLORIDE CRYS ER 20 MEQ PO TBCR
40.0000 meq | EXTENDED_RELEASE_TABLET | Freq: Once | ORAL | Status: AC
  Administered 2023-11-04: 40 meq via ORAL
  Filled 2023-11-04: qty 2

## 2023-11-04 MED ORDER — WARFARIN SODIUM 2 MG PO TABS
2.0000 mg | ORAL_TABLET | Freq: Once | ORAL | Status: AC
  Administered 2023-11-04: 2 mg via ORAL
  Filled 2023-11-04: qty 1

## 2023-11-04 MED ORDER — HYDROMORPHONE HCL 2 MG PO TABS
4.0000 mg | ORAL_TABLET | Freq: Four times a day (QID) | ORAL | Status: DC | PRN
  Administered 2023-11-04 – 2023-11-13 (×26): 4 mg via ORAL
  Filled 2023-11-04 (×27): qty 2

## 2023-11-04 MED ORDER — LOSARTAN POTASSIUM 25 MG PO TABS
25.0000 mg | ORAL_TABLET | Freq: Two times a day (BID) | ORAL | Status: DC
  Administered 2023-11-04 – 2023-11-13 (×18): 25 mg via ORAL
  Filled 2023-11-04 (×18): qty 1

## 2023-11-04 MED ORDER — KETOROLAC TROMETHAMINE 10 MG PO TABS
10.0000 mg | ORAL_TABLET | Freq: Four times a day (QID) | ORAL | Status: AC | PRN
  Administered 2023-11-04 – 2023-11-08 (×9): 10 mg via ORAL
  Filled 2023-11-04 (×9): qty 1

## 2023-11-04 NOTE — Progress Notes (Signed)
Patient requesting for wound vac canister to be changed. New canister ordered and installed.

## 2023-11-04 NOTE — Progress Notes (Addendum)
Patient ID: Joel York, male   DOB: Aug 12, 1986, 37 y.o.   MRN: [id number]   Advanced Heart Failure VAD Team Note  PCP-Cardiologist: None   Subjective:    11/1: wound culture grew MRSA 11/05 Recurrent driveline infection and PICC site infection w/ RUE cellulitis.    11/7 Wound culture with S aureus and Enterobacter cloacae.  11/12: Wound culture 11/1 MRSA corrected to MSSA on culture from. Dapto stopped. Continue IV cefepime.  CT C/A/P w/ small amount of periprosthetic fluid within the left pleural space adjacent to the proximal portion of the power cord.   MAPs in 80s.  Resting comfortably in bed. Complains of pain at DL site. No SOB. No CP  Objective:    Vital Signs:   Temp:  [97.8 F (36.6 C)-98.8 F (37.1 C)] 98.1 F (36.7 C) (11/11 2312) Pulse Rate:  [76-89] 85 (11/11 1909) Resp:  [11-19] 16 (11/12 0554) BP: (91-112)/(57-95) 91/78 (11/12 0315) SpO2:  [98 %-99 %] 98 % (11/12 0315) Weight:  [67.4 kg-68.5 kg] 67.4 kg (11/12 0525) Last BM Date : 10/31/23 Mean arterial Pressure 100s >> improving to 80s-90s  Intake/Output:   Intake/Output Summary (Last 24 hours) at 11/04/2023 0722 Last data filed at 11/03/2023 1503 Gross per 24 hour  Intake 554.19 ml  Output 525 ml  Net 29.19 ml    Physical Exam    General: Well appearing. No distress on RA HEENT: neck supple.   Cardiac: JVP not visible. Mechanical heart sounds with LVAD hum present.  Resp: Lung sounds clear and equal B/L Abdomen: Soft, non-tender, non-distended. + BS. Driveline: Dressing C/D/I. Wound vac in place. No drainage or redness. Anchor in place. Extremities: Warm and dry. No rash, cyanosis, or edema. LUE PICC Neuro: Alert and oriented x3. Affect pleasant. Moves all extremities without difficulty.  LVAD Interrogation HM III: Speed: 5600 Flow: 4.2 PI: 3.6 Power: 4.3. 8 PI events overnight w 1 speed drop.  Telemetry   NSR 80s (Personally reviewed)  EKG    N/A  Labs   Basic Metabolic  Panel: Recent Labs  Lab 10/28/23 2135 10/29/23 0237 10/30/23 0245 10/30/23 1957 10/31/23 0641 11/03/23 0523 11/04/23 0334  NA 141   < > 138 137 137 137 135  K 3.4*   < > 3.8 3.8 4.4 3.1* 3.4*  CL 103   < > 102 102 98 112* 113*  CO2 27   < > 26 27 27 23 23   GLUCOSE 94   < > 127* 154* 160* 114* 107*  BUN 11   < > 10 11 12 8 9   CREATININE 0.76   < > 0.66 0.83 0.71 0.62 0.69  CALCIUM 9.0   < > 8.8* 8.9 8.3* 7.5* 7.7*  MG 1.9  --   --   --   --   --   --    < > = values in this interval not displayed.    Liver Function Tests: Recent Labs  Lab 10/28/23 2135  AST 16  ALT 8  ALKPHOS 150*  BILITOT 0.5  PROT 7.2  ALBUMIN 3.5   No results for input(s): "LIPASE", "AMYLASE" in the last 168 hours. No results for input(s): "AMMONIA" in the last 168 hours.  CBC: Recent Labs  Lab 10/28/23 2135 10/29/23 0237 10/31/23 0641 11/01/23 0710 11/02/23 1211 11/03/23 0523 11/04/23 0334  WBC 6.9   < > 7.6 7.2 8.0 5.7 5.6  NEUTROABS 3.8  --   --   --   --   --   --  HGB 12.1*   < > 11.0* 11.5* 11.6* 10.1* 10.2*  HCT 38.7*   < > 34.8* 35.9* 35.5* 32.3* 32.0*  MCV 88.6   < > 85.9 86.9 86.6 86.6 87.2  PLT 279   < > 310 283 340 247 228   < > = values in this interval not displayed.    INR: Recent Labs  Lab 10/31/23 0641 11/01/23 0710 11/02/23 1211 11/03/23 0523 11/04/23 0334  INR 1.3* 1.5* 1.4* 1.5* 1.5*    Other results: EKG:    Imaging   [place] EKG SITE RITE  Result Date: 11/02/2023 If Site Rite image not attached, placement could not be confirmed due to current cardiac rhythm.    Medications:     Scheduled Medications:  buprenorphine-naloxone  1 tablet Sublingual Daily   digoxin  0.125 mg Oral Daily   gabapentin  300 mg Oral TID   ivabradine  5 mg Oral BID WC   losartan  50 mg Oral BID   mupirocin ointment  1 Application Nasal BID   pantoprazole  40 mg Oral Daily   sertraline  25 mg Oral Daily   sodium chloride flush  10-40 mL Intracatheter Q12H    spironolactone  25 mg Oral Daily   Warfarin - Pharmacist Dosing Inpatient   Does not apply q1600   zinc sulfate (50mg  elemental zinc)  220 mg Oral Daily    Infusions:  ceFEPime (MAXIPIME) IV 2 g (11/04/23 0313)   heparin 500 Units/hr (11/04/23 0444)    PRN Medications: acetaminophen, HYDROmorphone, hydrOXYzine, ketorolac, mouth rinse, sodium chloride flush, traZODone   Patient Profile   Joel York is a 37 yo male with PMH of 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 Now presenting with driveline and PICC line infection.   Assessment/Plan:   1. 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 Went for I&D/wound vac change x3. Discharged home with 6 weeks of IV abx.  - Now re-admitted with persistent driveline infection.  - Wound culture from 11/1 grew MRSA. Corrected to MSSA on 11/12. Dapto stopped. - Wound culture from 11/7 with S. aureus and Enterobacter cloacae - CT of C/A/P w/ small amount of periprosthetic fluid within the left pleural space adjacent to the proximal portion of the power cord.  - ID following. IV cefepime. PICC replaced 11/02/23. Will likely require long duration of IV antibiotics. - Tentative plan to return to OR for I&D 11/15.   2. PICC line infection - PICC pulled in clinic 10/27/23 - abx per above. ID following  - PICC replaced 11/02/23   3. 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 Echo EF <20%,  RV mildly reduced, - Etiology uncertain. ? 2/2 PVCs vs genetically mediated +/- hypertension.  - LHC: normal coronaries.  - cMRI:  LV markedly dilated EF 10% RV 17% NICM - Underwent HM-III VAD on 07/28/25. Much improved NYHA I-II  - INR 1.5, goal INR 1.5 -2 on warfarin while awaiting driveline site debridement, also low dose heparin.  - Volume status stable.   - Continue digoxin 0.125 mg daily - BP  improving after increasing Losartan yesterday, continue 50 mg BID.  - Continue spiro 25 mg daily - Continue Ivabradine 5 BID   4. 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 Hx drug abuse/Tobacco abuse - reports quit smoking 7/24, cotinine level at 30.1 09/22/23 -  Continue suboxone and PRN dilaudid  - Follows with pain clinic  6. Hypokalemia - K 3.4 - Supp today  Length of Stay: 7  [place] Lee, NP 11/04/2023, 7:22 AM  VAD Team --- VAD ISSUES ONLY--- Pager [phone] (7am - 7am)  Advanced Heart Failure Team  Pager [phone] (M-F; 7a - 5p)  Please contact [facility] for night-coverage after hours (5p -7a ) and weekends on [email or web address]   Patient seen and examined with the above-signed Advanced Practice Provider and/or Housestaff. I personally reviewed laboratory data, imaging studies and relevant notes. I independently examined the patient and formulated the important aspects of the plan. I have edited the note to reflect any of my changes or salient points. I have personally discussed the plan with the patient and/or family.  Remains on IV abx. Wound vac in place. Afebrile.   Remains on heparin. No bleeding  Continue to c/o pain at wound vac site.  General:  NAD.  HEENT: normal  Neck: supple. JVP not elevated.  Carotids 2+ bilat; no bruits. No lymphadenopathy or thryomegaly appreciated. Cor: LVAD hum.  Lungs: Clear. Abdomen:soft,non-distended. No hepatosplenomegaly. No bruits or masses. Good bowel sounds. DL site with wound vac. Good seal mildly tender Extremities: no cyanosis, clubbing, rash. Warm no edema  Neuro: alert & oriented x 3. No focal deficits. Moves all 4 without problem   Continue IV abx. Continue heparin/warfarin (dosing d/w PharmD)   For OR on Friday.   Supp K  VAD interrogated personally. Parameters stable.  [name], MD  10:27 AM

## 2023-11-04 NOTE — Progress Notes (Signed)
PHARMACY - ANTICOAGULATION CONSULT NOTE  Pharmacy Consult for heparin and warfarin Indication: LVAD  Allergies  Allergen Reactions   Chlorhexidine Dermatitis and Rash   Other Anaphylaxis    Tree Nuts   Peanuts [Peanut Oil] Anaphylaxis    Patient Measurements: Height: 5\' 7"  (170.2 cm) Weight: 67.4 kg (148 lb 9.4 oz) IBW/kg (Calculated) : 66.1 Heparin Dosing Weight: 64 kg  Vital Signs: Temp: 97.8 F (36.6 C) (11/12 1154) Temp Source: Oral (11/12 1154) BP: 106/94 (11/12 1154) Pulse Rate: 72 (11/12 1154)  Labs: Recent Labs    11/02/23 1211 11/03/23 0523 11/03/23 0525 11/04/23 0334  HGB 11.6* 10.1*  --  10.2*  HCT 35.5* 32.3*  --  32.0*  PLT 340 247  --  228  LABPROT 16.9* 18.1*  --  18.7*  INR 1.4* 1.5*  --  1.5*  HEPARINUNFRC <0.10*  --  <0.10* <0.10*  CREATININE  --  0.62  --  0.69    Estimated Creatinine Clearance: 119.3 mL/min (by C-G formula based on SCr of 0.69 mg/dL).   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 Patient had significant bleeding at driveline site on 09/30/23 requiring FFP. PTA Warfarin is on hold. Pharmacy consulted for heparin.   INR is up to 1.5. Hgb 10.2, plt 310 >228. LDH stable. No s/sx of bleeding. Discussed with Dr Donata Clay, will start heparin infusion at low dose and plan for warfarin restart - aim for goal 1.5-2 until no longer planning for debridements. Heparin level remains undetectable, <0.1 as intended.  Warfarin PTA: 3mg  MWF, 2mg  TTSS but INRs have been subtherapeutic.  Goal of Therapy:  Heparin level <0.3 units/ml - no titration unless directed by MD INR 2- 2.5 long term >> plan for 1.5-2 while undergoing debridements  Monitor platelets by anticoagulation protocol: Yes    Plan:  Continue heparin gtt at 500 units/hr Warfarin 2 mg again tonight  Monitor daily heparin level, INR, CBC, and for s/sx of bleeding   Thank you for allowing pharmacy to participate in this patient's care.  Reece Leader, Colon Flattery, BCCP Clinical Pharmacist  11/04/2023 12:31 PM   [facility] pharmacy phone numbers are listed on [email or web address]

## 2023-11-04 NOTE — Progress Notes (Addendum)
LVAD Coordinator Rounding Note:  Admitted 10/28/23 to heart failure service due to worsening driveline infection. Pt seen in VAD clinic 11/4- wound looked progressively worse. Decision was made to admit for further IV antibiotics and debridement.   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  Pt laying in bed. Reports pain at wound vac site. Pt states it has improved over the weekend. IV pain medication weaned over the weekend to transition to po.    Rash scabbed over on arms, neck, and back. Rash still present on abdomen but pt states this is less painful/itchy.   ID consulted. Cultures positive for Staph Aureus and Enterobacter Cloacae as of 10/30/23. Currently receiving Cefepime 2g q8h.  Plan for wound debridement and wound vac placement in OR next Friday 11/15 per Dr Donata Clay.   Vital signs: Temp: 97.7 HR: 96 Doppler Pressure: 82 Auto BP: 92/81 (86) O2 Sat: 98% on RA Wt: 141.1>141.5>145.9>150.2>154>152.6 >151>148.5lbs    LVAD interrogation reveals:  Speed: 5600 Flow: 4.1 Power: 4.2 w PI: 3.8  Alarms: none Events: 10 today; 25 yesterday Hematocrit: 32  Fixed speed: 5600 Low speed limit: 5300  Drive Line: Wound vac dressing clean, dry, intact. Negative pressure -125. Minimal serosanguinous drainage in canister. Plan for wound vac change in OR Friday 11/15 with Dr Donata Clay.   Labs:  LDH trend: 133>134>124>146  INR trend: 2.4>1.7>1.3>1.5>1.4>1.5  WBC trend: 6.2>5.6>7.6>7.2>8.0>5.7>5.6  Anticoagulation Plan: -INR Goal: 2-2.5 -ASA Dose: off  Blood Products:  10/29/23>> 2 FFP  Infection:  10/24/23>>wound cx>>STAPH AUREUS; final 10/29/23>>blood cultures>> no growth 5 days 10/30/23>> wound cx>>RARE STAPHYLOCOCCUS AUREUS  RARE ENTEROBACTER CLOACAE; pending   Drips:  Heparin 500 units/hr  Adverse Events on VAD: - Admitted 09/29/23 with DL infection. Wound cx + staph aureus. Underwent I&D/wound vac change x3. ID saw, discharged home on 6 weeks of IV  cefazolin (end date 11/18/23).   Plan/Recommendations:  1. Please page VAD coordinator for any alarms or VAD equipment issues. 2. Page VAD coordinator for wound vac issues 3. VAD coordinator will accompany pt to OR Friday 11/15  Carlton Adam RN,BSN VAD Coordinator  Office: [phone]  24/7 Pager: [phone]

## 2023-11-04 NOTE — Progress Notes (Addendum)
ID Brief note   Remains afebrile Labs remarkable for no leukocytosis  New PICC on 11/10, old removed 11/11  Per micro, 11/1 wound cx has been CORRECTED as MSSA ( previously reported as  MRSA)   11/7 wound cx MSSA and Enterobacter cloacae.  Results for orders placed or performed during the hospital encounter of 10/28/23  Culture, blood (Routine X 2) w Reflex to ID Panel     Status: None   Collection Time: 10/29/23  3:32 PM   Specimen: BLOOD  Result Value Ref Range Status   Specimen Description BLOOD SITE NOT SPECIFIED  Final   Special Requests   Final    BOTTLES DRAWN AEROBIC AND ANAEROBIC Blood Culture adequate volume   Culture   Final    NO GROWTH 5 DAYS Performed at [facility], [address], [place], [place] [address]    Report Status 11/03/2023 FINAL  Final  Culture, blood (Routine X 2) w Reflex to ID Panel     Status: None   Collection Time: 10/29/23  3:32 PM   Specimen: BLOOD  Result Value Ref Range Status   Specimen Description BLOOD SITE NOT SPECIFIED  Final   Special Requests   Final    BOTTLES DRAWN AEROBIC AND ANAEROBIC Blood Culture results may not be optimal due to an inadequate volume of blood received in culture bottles   Culture   Final    NO GROWTH 5 DAYS Performed at [facility], [address], [place], [place] [address]    Report Status 11/03/2023 FINAL  Final  SARS Coronavirus 2 by RT PCR (hospital order, performed in [facility] hospital lab) *cepheid single result test* Anterior Nasal Swab     Status: None   Collection Time: 10/29/23  5:30 PM   Specimen: Anterior Nasal Swab  Result Value Ref Range Status   SARS Coronavirus 2 by RT PCR NEGATIVE NEGATIVE Final    Comment: Performed at [facility], [address] [place], [place] [address]  Surgical pcr screen     Status: Abnormal   Collection Time: 10/30/23  6:08 AM   Specimen: Nasal Mucosa; Nasal Swab  Result Value Ref Range Status   MRSA, PCR NEGATIVE NEGATIVE Final    Staphylococcus aureus POSITIVE (A) NEGATIVE Final    Comment: (NOTE) The Xpert SA Assay (FDA approved for NASAL specimens in patients 36 years of age and older), is one component of a comprehensive surveillance program. It is not intended to diagnose infection nor to guide or monitor treatment. Performed at [facility], [address], [place], [place] [address]   Aerobic/Anaerobic Culture w Gram Stain (surgical/deep wound)     Status: None (Preliminary result)   Collection Time: 10/30/23  2:21 PM   Specimen: Wound  Result Value Ref Range Status   Specimen Description WOUND ABDOMEN  Final   Special Requests NONE  Final   Gram Stain   Final    NO WBC SEEN RARE GRAM POSITIVE COCCI IN PAIRS Performed at [facility], [address] [place], [place] [address]    Culture   Final    RARE STAPHYLOCOCCUS AUREUS RARE ENTEROBACTER CLOACAE NO ANAEROBES ISOLATED; CULTURE IN PROGRESS FOR 5 DAYS    Report Status PENDING  Incomplete   Organism ID, Bacteria STAPHYLOCOCCUS AUREUS  Final   Organism ID, Bacteria ENTEROBACTER CLOACAE  Final      Susceptibility   Enterobacter cloacae - MIC*    CEFEPIME <=0.12  SENSITIVE Sensitive     CEFTAZIDIME <=1 SENSITIVE Sensitive     CIPROFLOXACIN <=0.25 SENSITIVE Sensitive     GENTAMICIN <=1 SENSITIVE Sensitive     IMIPENEM 0.5 SENSITIVE Sensitive     TRIMETH/SULFA <=20 SENSITIVE Sensitive     PIP/TAZO <=4 SENSITIVE Sensitive ug/mL    * RARE ENTEROBACTER CLOACAE   Staphylococcus aureus - MIC*    CIPROFLOXACIN <=0.5 SENSITIVE Sensitive     ERYTHROMYCIN >=8 RESISTANT Resistant     GENTAMICIN <=0.5 SENSITIVE Sensitive     OXACILLIN 0.5 SENSITIVE Sensitive     TETRACYCLINE <=1 SENSITIVE Sensitive     VANCOMYCIN 1 SENSITIVE Sensitive     TRIMETH/SULFA <=10 SENSITIVE Sensitive     CLINDAMYCIN <=0.25 SENSITIVE Sensitive     RIFAMPIN <=0.5 SENSITIVE Sensitive     Inducible Clindamycin NEGATIVE Sensitive     LINEZOLID 2 SENSITIVE Sensitive      * RARE STAPHYLOCOCCUS AUREUS     Plan -Daptomycin dc'ed yesterday as no MRSA +.  - Continue cefepime pending next OR on 11/15 - Monitor CBC and BMP on abtx  - ID available as needed until next OR. Please call with active questions.    Odette Fraction, MD Infectious Disease Physician [facility] [address] [place], [place] [address] Phone: [phone]  Fax: [phone]

## 2023-11-04 NOTE — Progress Notes (Signed)
This RN got in report that pt's 94 month old was left with patient by his wife while she went to pick up their other child. This RN went into patients room for morning assessment. Pt's 37 month old was asleep in the bed with pt. Nurse Manager made aware.

## 2023-11-04 NOTE — Plan of Care (Signed)
?  Problem: Activity: ?Goal: Risk for activity intolerance will decrease ?Outcome: Progressing ?  ?Problem: Nutrition: ?Goal: Adequate nutrition will be maintained ?Outcome: Progressing ?  ?Problem: Coping: ?Goal: Level of anxiety will decrease ?Outcome: Progressing ?  ?Problem: Elimination: ?Goal: Will not experience complications related to bowel motility ?Outcome: Progressing ?Goal: Will not experience complications related to urinary retention ?Outcome: Progressing ?  ?Problem: Safety: ?Goal: Ability to remain free from injury will improve ?Outcome: Progressing ?  ?

## 2023-11-05 DIAGNOSIS — T827XXA Infection and inflammatory reaction due to other cardiac and vascular devices, implants and grafts, initial encounter: Secondary | ICD-10-CM | POA: Diagnosis not present

## 2023-11-05 LAB — BASIC METABOLIC PANEL
Anion gap: 8 (ref 5–15)
BUN: 14 mg/dL (ref 6–20)
CO2: 25 mmol/L (ref 22–32)
Calcium: 9.1 mg/dL (ref 8.9–10.3)
Chloride: 102 mmol/L (ref 98–111)
Creatinine, Ser: 0.69 mg/dL (ref 0.61–1.24)
GFR, Estimated: >60 mL/min (ref >60)
Glucose, Bld: 122 mg/dL — ABNORMAL HIGH (ref 70–99)
Potassium: 4.2 mmol/L (ref 3.5–5.1)
Sodium: 135 mmol/L (ref 135–145)

## 2023-11-05 LAB — CBC
HCT: 36.9 % — ABNORMAL LOW (ref 39.0–52.0)
Hemoglobin: 11.9 g/dL — ABNORMAL LOW (ref 13.0–17.0)
MCH: 27.9 pg (ref 26.0–34.0)
MCHC: 32.2 g/dL (ref 30.0–36.0)
MCV: 86.6 fL (ref 80.0–100.0)
Platelets: 282 10*3/uL (ref 150–400)
RBC: 4.26 MIL/uL (ref 4.22–5.81)
RDW: 14.1 % (ref 11.5–15.5)
WBC: 6.8 10*3/uL (ref 4.0–10.5)
nRBC: 0 % (ref 0.0–0.2)

## 2023-11-05 LAB — LACTATE DEHYDROGENASE: LDH: 145 U/L (ref 98–192)

## 2023-11-05 LAB — PROTIME-INR
INR: 1.2 (ref 0.8–1.2)
Prothrombin Time: 15.6 s — ABNORMAL HIGH (ref 11.4–15.2)

## 2023-11-05 LAB — HEPARIN LEVEL (UNFRACTIONATED): Heparin Unfractionated: <0.1 [IU]/mL — ABNORMAL LOW (ref 0.30–0.70)

## 2023-11-05 MED ORDER — WARFARIN SODIUM 2.5 MG PO TABS
2.5000 mg | ORAL_TABLET | Freq: Once | ORAL | Status: AC
  Administered 2023-11-05: 2.5 mg via ORAL
  Filled 2023-11-05: qty 1

## 2023-11-05 NOTE — Plan of Care (Signed)
  Problem: Coping: Goal: Level of anxiety will decrease Outcome: Progressing   Problem: Elimination: Goal: Will not experience complications related to urinary retention Outcome: Progressing   Problem: Safety: Goal: Ability to remain free from injury will improve Outcome: Progressing   Problem: Skin Integrity: Goal: Risk for impaired skin integrity will decrease Outcome: Progressing   

## 2023-11-05 NOTE — TOC Initial Note (Signed)
Transition of Care [facility]) - Initial/Assessment Note    Patient Details  Name: Joel York MRN: [id number] Date of Birth: 1986-12-18  Transition [facility]) CM/SW Contact:    Elliot Cousin, RN Phone Number:[phone] 11/05/2023, 11:00 AM  Clinical Narrative:    Readmission reviewed. Patient was dc home with [facility] with IV abx. [facility] provided [facility] for PICC line care. Pt lives at home with wife. Was independent pta. Has scale at home for daily weight. CM following for dc needs home with IV abx, possible wound vac.               Expected Discharge Plan: Home w Home Health Services Barriers to Discharge: Continued Medical Work up   Patient Goals and CMS Choice Patient states their goals for this hospitalization and ongoing recovery are:: wants to return home CMS Medicare.gov Compare Post Acute Care list provided to:: [name]      Expected Discharge Plan and Services   Discharge Planning Services: CM Consult Post Acute Care Choice: Home Health Living arrangements for the past 2 months: Single Family Home                           HH Arranged: RN HH Agency: [facility] Date HH Agency Contacted: 11/05/23 Time HH Agency Contacted: 1058 Representative spoke with at [facility] Agency: [facility], Pam RN  Prior Living Arrangements/Services Living arrangements for the past 2 months: Single Family Home Lives with:: Spouse, Minor Children Patient language and need for interpreter reviewed:: Yes Do you feel safe going back to the place where you live?: Yes      Need for Family Participation in Patient Care: No (Comment) Care giver support system in place?: Yes (comment) Current home services: DME (scale, LVAD) Criminal Activity/Legal Involvement Pertinent to Current Situation/Hospitalization: No - Comment as needed  Activities of Daily Living   ADL Screening (condition at time of  admission) Independently performs ADLs?: Yes (appropriate for developmental age) (Simultaneous filing. User may not have seen previous data.) Is the patient deaf or have difficulty hearing?: No (Simultaneous filing. User may not have seen previous data.) Does the patient have difficulty seeing, even when wearing glasses/contacts?: No (Simultaneous filing. User may not have seen previous data.) Does the patient have difficulty concentrating, remembering, or making decisions?: No (Simultaneous filing. User may not have seen previous data.)  Permission Sought/Granted Permission sought to share information with : Case Manager, Family Supports, PCP Permission granted to share information with : Yes, Verbal Permission Granted  Share Information with NAME: Kiefer Sickles  Permission granted to share info w AGENCY: Home Health  Permission granted to share info w Relationship: wife  Permission granted to share info w Contact Information: [phone]  Emotional Assessment Appearance:: Appears stated age Attitude/Demeanor/Rapport: Engaged Affect (typically observed): Accepting Orientation: : Oriented to Self, Oriented to Place, Oriented to  Time, Oriented to Situation Alcohol / Substance Use: Not Applicable Psych Involvement: No (comment)  Admission diagnosis:  Infection associated with driveline of left ventricular assist device (LVAD) (HCC) [P29.7XXA] Patient Active Problem List   Diagnosis Date Noted   MSSA (methicillin susceptible Staphylococcus aureus) infection 10/02/2023   Staphylococcus aureus infection 09/30/2023   Infection associated with driveline of left ventricular assist device (LVAD) (HCC) 09/29/2023   AKI (acute kidney injury) (HCC) 07/14/2023   Acute on chronic systolic CHF (congestive heart failure) (HCC) 07/14/2023  Elevated troponin 07/14/2023   Sinus tachycardia 07/14/2023   PVC (premature ventricular contraction) 07/14/2023   Acute decompensated heart failure (HCC)  07/14/2023   History of asthma 07/14/2023   Myocardial injury 07/14/2023   Polycythemia 07/14/2023   Chest pain 07/14/2023   Transaminitis 07/14/2023   Elevated bilirubin 07/14/2023   CHF exacerbation (HCC) 07/14/2023   ADD (attention deficit disorder) 08/29/2015   Visit for preventive health examination 02/21/2015   Mild persistent asthma 02/14/2015   Attention deficit disorder 01/24/2011   SEIZURE DISORDER 09/14/2010   ANXIETY STATE, UNSPECIFIED 11/06/2009   BICEPS TENDINITIS, LEFT 11/22/2008   ASTHMA 09/26/2008   PCP:  No primary care provider on file. Pharmacy:   [id number] - [place], [place] - [address] AT [facility] [address] [place] [address] Phone: [phone] Fax: [phone]  [facility] [address] [place] [address] Phone: [phone] Fax: [phone]     Social Determinants of Health (SDOH) Social History: SDOH Screenings   Food Insecurity: No Food Insecurity (10/28/2023)  Housing: Low Risk  (10/28/2023)  Transportation Needs: No Transportation Needs (10/28/2023)  Utilities: Not At Risk (10/28/2023)  Alcohol Screen: Low Risk  (07/14/2023)  Financial Resource Strain: Medium Risk (08/12/2023)  Physical Activity: Sufficiently Active (07/14/2023)  Stress: Stress Concern Present (07/14/2023)  Tobacco Use: High Risk (10/30/2023)   SDOH Interventions:     Readmission Risk Interventions     No data to display

## 2023-11-05 NOTE — Progress Notes (Addendum)
LVAD Coordinator Rounding Note:  Admitted 10/28/23 to heart failure service due to worsening driveline infection. Pt seen in VAD clinic 11/4- wound looked progressively worse. Decision was made to admit for further IV antibiotics and debridement.   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  Pt laying in bed. Reports worsening pain at wound vac site. IV pain medication weaned over the weekend to transition to po. Pt states he is unable to get out of bed due to increased pain. VAD Coordinator reduced suction to -100 on wound vac to attempt to assist with pain. Pt reports "it feels like it's suctioning harder" with change. Suction left at -125. AHF rounding team made aware.   Rash scabbed over on arms, neck, and back. Rash still present on abdomen but pt states this is less painful/itchy.   ID consulted. Cultures positive for Staph Aureus and Enterobacter Cloacae as of 10/30/23. Currently receiving Cefepime 2g q8h.  Plan for wound debridement and wound vac placement in OR next Friday 11/15 per Dr Donata Clay.   Vital signs: Temp: 97.6 HR: 71 Doppler Pressure: 88 Auto BP: 97/74 (82) O2 Sat: 98% on RA Wt: 141.1>141.5>145.9>150.2>154>152.6 >151>148.5>147.3lbs    LVAD interrogation reveals:  Speed: 5600 Flow: 4.1 Power: 4.2 w PI: 3.8  Alarms: none Events: 3 today Hematocrit: 32  Fixed speed: 5600 Low speed limit: 5300  Drive Line: Wound vac dressing clean, dry, intact. Negative pressure -125. Minimal serosanguinous drainage in canister. Plan for wound vac change in OR Friday 11/15 with Dr Donata Clay.   Labs:  LDH trend: 133>134>124>146>145  INR trend: 2.4>1.7>1.3>1.5>1.4>1.5>1.2  WBC trend: 6.2>5.6>7.6>7.2>8.0>5.7>5.6>6.8  Anticoagulation Plan: -INR Goal: 2-2.5 -ASA Dose: off  Blood Products:  10/29/23>> 2 FFP  Infection:  10/24/23>>wound cx>>STAPH AUREUS; final 10/29/23>>blood cultures>> no growth 5 days 10/30/23>> wound cx>>RARE STAPHYLOCOCCUS AUREUS  RARE  ENTEROBACTER CLOACAE; final   Drips:  Heparin 500 units/hr  Adverse Events on VAD: - Admitted 09/29/23 with DL infection. Wound cx + staph aureus. Underwent I&D/wound vac change x3. ID saw, discharged home on 6 weeks of IV cefazolin (end date 11/18/23).   Plan/Recommendations:  1. Please page VAD coordinator for any alarms or VAD equipment issues. 2. Page VAD coordinator for wound vac issues 3. VAD coordinator will accompany pt to OR Friday 11/15  Simmie Davies RN,BSN VAD Coordinator  Office: [phone]  24/7 Pager: [phone]

## 2023-11-05 NOTE — Progress Notes (Addendum)
Patient ID: Joel York, male   DOB: 07-17-1986, 37 y.o.   MRN: [id number]   Advanced Heart Failure VAD Team Note  PCP-Cardiologist: None   Subjective:    11/1: wound culture grew MRSA  11/05 Recurrent driveline infection and PICC site infection w/ RUE cellulitis.    11/06: CT C/A/P: small amount of periprosthetic fluid within the left pleural space adjacent to the proximal portion of the power cord.  11/7: Wound culture with S aureus and Enterobacter cloacae.  11/10: New PICC 11/12: Wound culture 11/1 MRSA corrected to MSSA. Dapto stopped. Continued IV cefepime.  Back to OR on 11/15.   INR 1.2.  Losartan decreased yesterday evening d/t run of NSVT and soft MAPs. MAP now 70s-80s.  Minimal drainage from wound VAC. Reporting pain from VAC suction.  VAD Interrogation: Flow 4.7 L/min, Speed 5600 RPM, Power 4 Watts, PI 2.4. 4 PI events so far this am with one speed drop.   Objective:    Vital Signs:   Temp:  [97.6 F (36.4 C)-98.1 F (36.7 C)] 97.6 F (36.4 C) (11/13 0425) Pulse Rate:  [72-101] 88 (11/12 2347) Resp:  [10-23] 23 (11/13 0625) BP: (83-106)/(52-94) 101/52 (11/13 0425) SpO2:  [97 %-98 %] 97 % (11/12 2347) Weight:  [66.8 kg] 66.8 kg (11/13 0625) Last BM Date : 11/04/23 Mean arterial Pressure 70s-80s  Intake/Output:   Intake/Output Summary (Last 24 hours) at 11/05/2023 0659 Last data filed at 11/05/2023 0000 Gross per 24 hour  Intake 1404.62 ml  Output 1800 ml  Net -395.38 ml    Physical Exam    Physical Exam: GENERAL: Well appearing. HEENT: normal  NECK: Supple, JVP not elevated .  2+ bilaterally, no bruits.   CARDIAC:  Mechanical heart sounds with LVAD hum present.  LUNGS:  Clear to auscultation bilaterally.  ABDOMEN:  Soft, round, nontender, positive bowel sounds x4.     LVAD exit site:   VAC present. EXTREMITIES:  Warm and dry, no cyanosis, clubbing, rash or edema  NEUROLOGIC:  Alert and oriented x 4.  No aphasia.  No dysarthria.  Affect pleasant.       Telemetry   SR 70s, 6 beat run NSVT this am  EKG    N/A  Labs   Basic Metabolic Panel: Recent Labs  Lab 10/30/23 1957 10/31/23 0641 11/03/23 0523 11/04/23 0334 11/05/23 0505  NA 137 137 137 135 135  K 3.8 4.4 3.1* 3.4* 4.2  CL 102 98 112* 113* 102  CO2 27 27 23 23 25   GLUCOSE 154* 160* 114* 107* 122*  BUN 11 12 8 9 14   CREATININE 0.83 0.71 0.62 0.69 0.69  CALCIUM 8.9 8.3* 7.5* 7.7* 9.1    Liver Function Tests: No results for input(s): "AST", "ALT", "ALKPHOS", "BILITOT", "PROT", "ALBUMIN" in the last 168 hours.  No results for input(s): "LIPASE", "AMYLASE" in the last 168 hours. No results for input(s): "AMMONIA" in the last 168 hours.  CBC: Recent Labs  Lab 11/01/23 0710 11/02/23 1211 11/03/23 0523 11/04/23 0334 11/05/23 0505  WBC 7.2 8.0 5.7 5.6 6.8  HGB 11.5* 11.6* 10.1* 10.2* 11.9*  HCT 35.9* 35.5* 32.3* 32.0* 36.9*  MCV 86.9 86.6 86.6 87.2 86.6  PLT 283 340 247 228 282    INR: Recent Labs  Lab 11/01/23 0710 11/02/23 1211 11/03/23 0523 11/04/23 0334 11/05/23 0505  INR 1.5* 1.4* 1.5* 1.5* 1.2    Other results: EKG:    Imaging   No results found.   Medications:  Scheduled Medications:  buprenorphine-naloxone  1 tablet Sublingual Daily   digoxin  0.125 mg Oral Daily   gabapentin  300 mg Oral TID   ivabradine  5 mg Oral BID WC   losartan  25 mg Oral BID   pantoprazole  40 mg Oral Daily   sertraline  25 mg Oral Daily   sodium chloride flush  10-40 mL Intracatheter Q12H   spironolactone  25 mg Oral Daily   Warfarin - Pharmacist Dosing Inpatient   Does not apply q1600   zinc sulfate (50mg  elemental zinc)  220 mg Oral Daily    Infusions:  ceFEPime (MAXIPIME) IV 2 g (11/05/23 0530)   heparin 500 Units/hr (11/04/23 1637)    PRN Medications: acetaminophen, HYDROmorphone, hydrOXYzine, ketorolac, mouth rinse, sodium chloride flush, traZODone   Patient Profile   Joel York is a 37 yo male with PMH of chronic  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 Now presenting with driveline and PICC line infection.   Assessment/Plan:   1. 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 Went for I&D/wound vac change x3. Discharged home with 6 weeks of IV abx.  - Now re-admitted with persistent driveline infection.  - Wound culture from 11/1 grew MRSA. Corrected to MSSA on 11/12. Dapto stopped. - CT C/A/P 11/06 w/ small amount of periprosthetic fluid within the left pleural space adjacent to the proximal portion of the power cord.  - Wound culture from 11/7 with S. aureus and Enterobacter cloacae - ID following. IV cefepime. PICC replaced 11/02/23. Will likely require long duration of IV antibiotics. - Tentative plan to return to OR for I&D 11/15 - Noting increased pain at [facility] site, "feels like it is pulling too hard". Minimal drainage noted. Discussed with VAD coordinators. May need to reduce suction.   2. PICC line infection - PICC pulled in clinic 10/27/23 - abx per above. ID following  - PICC replaced 11/02/23   3. 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 Echo EF <20%,  RV mildly reduced, - Etiology uncertain. ? 2/2 PVCs vs genetically mediated +/- hypertension.  - LHC: normal coronaries.  - cMRI:  LV markedly dilated EF 10% RV 17% NICM - Underwent HM-III VAD on 07/28/25. Much improved NYHA I-II  - INR 1.2, goal INR 1.5 -2 on warfarin while awaiting driveline site debridement, also low dose heparin.  - Volume status stable.   - Continue digoxin 0.125 mg daily - Continue Losartan 25 mg BID (dose reduced 11/12 d/t soft MAP) - Continue spiro 25 mg daily - Continue Ivabradine 5 BID   4. 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 Hx drug abuse/Tobacco abuse - reports quit smoking 7/24, cotinine level at 30.1 09/22/23 - Continue suboxone and PRN  dilaudid  - Follows with pain clinic    Length of Stay: 8  [name], [name], PA-C 11/05/2023, 6:59 AM  VAD Team --- VAD ISSUES ONLY--- Pager [phone] (7am - 7am)  Advanced Heart Failure Team  Pager [phone] (M-F; 7a - 5p)  Please contact [facility] for night-coverage after hours (5p -7a ) and weekends on [email or web address]   Patient seen and examined with the above-signed Advanced Practice Provider and/or Housestaff. I personally reviewed laboratory data, imaging studies and relevant notes. I independently examined the patient and formulated the important aspects of the plan. I have edited the note to  reflect any of my changes or salient points. I have personally discussed the plan with the patient and/or family.  Remains on IV abx and wound vac. Afebrile.   On heparin/warfarin. No bleeding  INR 1.2  Continues to complain of pain at wound vac site and was apparently yelling at staff last night  General:  NAD.  HEENT: normal  Neck: supple. JVP not elevated.  Carotids 2+ bilat; no bruits. No lymphadenopathy or thryomegaly appreciated. Cor: LVAD hum.  Lungs: Clear. Abdomen: soft, +tender, non-distended. No hepatosplenomegaly. No bruits or masses. Good bowel sounds. Driveline site with wound vac. Good seal. Anchor in place.  Extremities: no cyanosis, clubbing, rash. Warm no edema  Neuro: alert & oriented x 3. No focal deficits. Moves all 4 without problem   Continue IV abx. For OR on Friday for repeat washout.   Continue heparin/warfarin. Discussed warfarin dosing with PharmD personally.  Long talk about degree of pain meds he is requiring and that it is far above the range that people usually require for DL infections/wounds and that I am concerned that his addiction is flaring. Discussed need to limit narcotics and find other methods to help with pain control.   VAD interrogated personally. Parameters stable.  [name], MD  7:55 PM

## 2023-11-05 NOTE — Progress Notes (Addendum)
PHARMACY - ANTICOAGULATION CONSULT NOTE  Pharmacy Consult for heparin and warfarin Indication: LVAD  Allergies  Allergen Reactions   Chlorhexidine Dermatitis and Rash   Other Anaphylaxis    Tree Nuts   Peanuts [Peanut Oil] Anaphylaxis    Patient Measurements: Height: 5\' 7"  (170.2 cm) Weight: 66.8 kg (147 lb 4.3 oz) IBW/kg (Calculated) : 66.1 Heparin Dosing Weight: 64 kg  Vital Signs: Temp: 97.6 F (36.4 C) (11/13 0852) Temp Source: Oral (11/13 0852) BP: 97/74 (11/13 0852) Pulse Rate: 89 (11/13 0904)  Labs: Recent Labs    11/03/23 0523 11/03/23 0525 11/04/23 0334 11/05/23 0505  HGB 10.1*  --  10.2* 11.9*  HCT 32.3*  --  32.0* 36.9*  PLT 247  --  228 282  LABPROT 18.1*  --  18.7* 15.6*  INR 1.5*  --  1.5* 1.2  HEPARINUNFRC  --  <0.10* <0.10* <0.10*  CREATININE 0.62  --  0.69 0.69    Estimated Creatinine Clearance: 119.3 mL/min (by C-G formula based on SCr of 0.69 mg/dL).   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 Patient had significant bleeding at driveline site on 09/30/23 requiring FFP. PTA Warfarin is on hold. Pharmacy consulted for heparin.   INR down to 1.2. Hgb 11.9, plt 282. LDH stable at 145. No s/sx of bleeding. Discussed with Dr Donata Clay, will continue heparin infusion at low dose and plan for warfarin with goal goal 1.5-2 until no longer planning for debridements. Heparin level remains undetectable, <0.1 as expected.  Warfarin PTA: 3mg  MWF, 2mg  TTSS but INRs have been subtherapeutic.  Goal of Therapy:  Heparin level <0.3 units/ml - no titration unless directed by MD INR 2- 2.5 long term >> plan for 1.5-2 while undergoing debridements  Monitor platelets by anticoagulation protocol: Yes    Plan:  Continue heparin gtt at 500 units/hr Warfarin 2.5 mg tonight  Monitor daily heparin level, INR, CBC, and for s/sx of bleeding   Thank you for allowing pharmacy to participate in this patient's care,  Sherron Monday, PharmD, BCCCP Clinical Pharmacist  Phone: [phone] 11/05/2023 10:04 AM  Please check AMION for all [facility] Pharmacy phone numbers After 10:00 PM, call Main Pharmacy [phone]

## 2023-11-05 NOTE — TOC Progression Note (Signed)
Transition of Care [facility]) - Progression Note    Patient Details  Name: Joel York MRN: [id number] Date of Birth: [birth year]  [facility] CM/SW Contact  Nicanor Bake Phone Number: [phone] 11/05/2023, 10:25 AM  Clinical Narrative:  HF CSW met with pt at bedside. Pt stated that he was feeling alright. CSW asked if the pt has spoken to his wife about recent call. Pt stated that his wife did sare updates. CSW discussed the importance of the pt establishing care with a PCP.  Pt stated that he was not interested in really seeing one, but will go. CSW encouraged the pt to attend the appointment since that is where he requested to be seen. CSW stressed to call and reschedule if he cannot make the appointment. A no show appointment will make it difficult to establish care there.   TOC will continue following.     Expected Discharge Plan: Home w Home Health Services Barriers to Discharge: Continued Medical Work up  Expected Discharge Plan and Services       Living arrangements for the past 2 months: Single Family Home                                       Social Determinants of Health (SDOH) Interventions SDOH Screenings   Food Insecurity: No Food Insecurity (10/28/2023)  Housing: Low Risk  (10/28/2023)  Transportation Needs: No Transportation Needs (10/28/2023)  Utilities: Not At Risk (10/28/2023)  Alcohol Screen: Low Risk  (07/14/2023)  Financial Resource Strain: Medium Risk (08/12/2023)  Physical Activity: Sufficiently Active (07/14/2023)  Stress: Stress Concern Present (07/14/2023)  Tobacco Use: High Risk (10/30/2023)    Readmission Risk Interventions     No data to display

## 2023-11-06 DIAGNOSIS — T827XXA Infection and inflammatory reaction due to other cardiac and vascular devices, implants and grafts, initial encounter: Secondary | ICD-10-CM | POA: Diagnosis not present

## 2023-11-06 LAB — CBC
HCT: 39.5 % (ref 39.0–52.0)
Hemoglobin: 12.7 g/dL — ABNORMAL LOW (ref 13.0–17.0)
MCH: 27.3 pg (ref 26.0–34.0)
MCHC: 32.2 g/dL (ref 30.0–36.0)
MCV: 84.9 fL (ref 80.0–100.0)
Platelets: 291 10*3/uL (ref 150–400)
RBC: 4.65 MIL/uL (ref 4.22–5.81)
RDW: 13.8 % (ref 11.5–15.5)
WBC: 7.6 10*3/uL (ref 4.0–10.5)
nRBC: 0 % (ref 0.0–0.2)

## 2023-11-06 LAB — PROTIME-INR
INR: 1.3 — ABNORMAL HIGH (ref 0.8–1.2)
Prothrombin Time: 16.8 s — ABNORMAL HIGH (ref 11.4–15.2)

## 2023-11-06 LAB — BASIC METABOLIC PANEL
Anion gap: 7 (ref 5–15)
BUN: 13 mg/dL (ref 6–20)
CO2: 28 mmol/L (ref 22–32)
Calcium: 9.1 mg/dL (ref 8.9–10.3)
Chloride: 99 mmol/L (ref 98–111)
Creatinine, Ser: 0.99 mg/dL (ref 0.61–1.24)
GFR, Estimated: >60 mL/min (ref >60)
Glucose, Bld: 93 mg/dL (ref 70–99)
Potassium: 4.3 mmol/L (ref 3.5–5.1)
Sodium: 134 mmol/L — ABNORMAL LOW (ref 135–145)

## 2023-11-06 LAB — HEPARIN LEVEL (UNFRACTIONATED): Heparin Unfractionated: <0.1 [IU]/mL — ABNORMAL LOW (ref 0.30–0.70)

## 2023-11-06 MED ORDER — WARFARIN SODIUM 2.5 MG PO TABS
2.5000 mg | ORAL_TABLET | Freq: Once | ORAL | Status: AC
  Administered 2023-11-06: 2.5 mg via ORAL
  Filled 2023-11-06: qty 1

## 2023-11-06 NOTE — Progress Notes (Signed)
LVAD Coordinator Rounding Note:  Admitted 10/28/23 to heart failure service due to worsening driveline infection. Pt seen in VAD clinic 11/4- wound looked progressively worse. Decision was made to admit for further IV antibiotics and debridement.   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  Pt laying in bed. Continued pain at wound vac site.    Rash scabbed over on arms, neck, and back. Rash still present on abdomen but pt states this is less painful/itchy.   ID consulted. Cultures positive for Staph Aureus and Enterobacter Cloacae as of 10/30/23. Currently receiving Cefepime 2g q8h.  Plan for wound debridement and wound vac placement in OR next Friday 11/15 per Dr Donata Clay.   Vital signs: Temp: 97.6 HR: 71 Doppler Pressure: 92 Auto BP: 97/67 (82) O2 Sat: 98% on RA Wt: 141.1>141.5>145.9>150.2>154>152.6 >151>148.5>147.3>144.4lbs    LVAD interrogation reveals:  Speed: 5650 Flow: 4.1 Power: 4.3 w PI: 3.8  Alarms: none Events: 50 PI events Hematocrit: 32  Fixed speed: 5600 Low speed limit: 5300  Drive Line: Wound vac dressing clean, dry, intact. Negative pressure -125. Minimal serosanguinous drainage in canister. Plan for wound vac change in OR Friday 11/15 with Dr Donata Clay.   Labs:  LDH trend: 133>134>124>146>145  INR trend: 2.4>1.7>1.3>1.5>1.4>1.5>1.2>1.3  WBC trend: 6.2>5.6>7.6>7.2>8.0>5.7>5.6>6.8>7.6  Anticoagulation Plan: -INR Goal: 2-2.5 -ASA Dose: off  Blood Products:  10/29/23>> 2 FFP  Infection:  10/24/23>>wound cx>>STAPH AUREUS; final 10/29/23>>blood cultures>> no growth 5 days 10/30/23>> wound cx>>RARE STAPHYLOCOCCUS AUREUS  RARE ENTEROBACTER CLOACAE; final   Drips:  Heparin 500 units/hr  Adverse Events on VAD: - Admitted 09/29/23 with DL infection. Wound cx + staph aureus. Underwent I&D/wound vac change x3. ID saw, discharged home on 6 weeks of IV cefazolin (end date 11/18/23).   Plan/Recommendations:  1. Please page VAD coordinator for  any alarms or VAD equipment issues. 2. Page VAD coordinator for wound vac issues 3. VAD coordinator will accompany pt to OR Friday 11/15  Simmie Davies RN,BSN VAD Coordinator  Office: [phone]  24/7 Pager: [phone]

## 2023-11-06 NOTE — Progress Notes (Addendum)
.Patient ID: Joel York, male   DOB: August 16, 1986, 37 y.o.   MRN: [id number]   Advanced Heart Failure VAD Team Note  PCP-Cardiologist: None   Subjective:    11/1: wound culture grew MRSA  11/05 Recurrent driveline infection and PICC site infection w/ RUE cellulitis.    11/06: CT C/A/P: small amount of periprosthetic fluid within the left pleural space adjacent to the proximal portion of the power cord.  11/7: Wound culture with S aureus and Enterobacter cloacae.  11/10: New PICC 11/12: Wound culture 11/1 MRSA corrected to MSSA. Dapto stopped. Continued IV cefepime.  Back to OR on 11/15.   INR 1.3.  MAP 70s-80s.   No complaints this am other than pain at driveline site.   VAD Interrogation: Flow 3.9 L/min, Speed 5600 RPM, Power 4 Watts, PI 5.3. 55 PI events so far this am, multiple elevated PIs up to 9. No alarms.   Objective:    Vital Signs:   Temp:  [97 F (36.1 C)-98.4 F (36.9 C)] 97.4 F (36.3 C) (11/14 0525) Pulse Rate:  [74-89] 74 (11/13 1144) Resp:  [10-20] 19 (11/14 0525) BP: (94-121)/(67-91) 94/72 (11/14 0525) Weight:  [65.5 kg] 65.5 kg (11/14 0525) Last BM Date : 11/04/23 Mean arterial Pressure 70s-80s  Intake/Output:   Intake/Output Summary (Last 24 hours) at 11/06/2023 0701 Last data filed at 11/06/2023 0525 Gross per 24 hour  Intake 260 ml  Output 1525 ml  Net -1265 ml    Physical Exam   Physical Exam: GENERAL: Well appearing.  HEENT: normal  NECK: Supple, JVP not elevated.  2+ bilaterally, no bruits.   CARDIAC:  Mechanical heart sounds with LVAD hum present.  LUNGS:  Clear to auscultation bilaterally.  ABDOMEN:  Soft, round, nontender, positive bowel sounds x4.     LVAD exit site:   VAC present with good seal.  EXTREMITIES:  Warm and dry, no cyanosis, clubbing, rash or edema  NEUROLOGIC:  Alert and oriented x 4.  No aphasia.  No dysarthria.  Affect pleasant.       Telemetry   SR 70s  EKG    N/A  Labs   Basic Metabolic  Panel: Recent Labs  Lab 10/30/23 1957 10/31/23 0641 11/03/23 0523 11/04/23 0334 11/05/23 0505  NA 137 137 137 135 135  K 3.8 4.4 3.1* 3.4* 4.2  CL 102 98 112* 113* 102  CO2 27 27 23 23 25   GLUCOSE 154* 160* 114* 107* 122*  BUN 11 12 8 9 14   CREATININE 0.83 0.71 0.62 0.69 0.69  CALCIUM 8.9 8.3* 7.5* 7.7* 9.1    Liver Function Tests: No results for input(s): "AST", "ALT", "ALKPHOS", "BILITOT", "PROT", "ALBUMIN" in the last 168 hours.  No results for input(s): "LIPASE", "AMYLASE" in the last 168 hours. No results for input(s): "AMMONIA" in the last 168 hours.  CBC: Recent Labs  Lab 11/02/23 1211 11/03/23 0523 11/04/23 0334 11/05/23 0505 11/06/23 0530  WBC 8.0 5.7 5.6 6.8 7.6  HGB 11.6* 10.1* 10.2* 11.9* 12.7*  HCT 35.5* 32.3* 32.0* 36.9* 39.5  MCV 86.6 86.6 87.2 86.6 84.9  PLT 340 247 228 282 291    INR: Recent Labs  Lab 11/02/23 1211 11/03/23 0523 11/04/23 0334 11/05/23 0505 11/06/23 0530  INR 1.4* 1.5* 1.5* 1.2 1.3*    Other results: EKG:    Imaging   No results found.   Medications:     Scheduled Medications:  buprenorphine-naloxone  1 tablet Sublingual Daily   digoxin  0.125 mg Oral Daily  gabapentin  300 mg Oral TID   ivabradine  5 mg Oral BID WC   losartan  25 mg Oral BID   pantoprazole  40 mg Oral Daily   sertraline  25 mg Oral Daily   sodium chloride flush  10-40 mL Intracatheter Q12H   spironolactone  25 mg Oral Daily   Warfarin - Pharmacist Dosing Inpatient   Does not apply q1600   zinc sulfate (50mg  elemental zinc)  220 mg Oral Daily    Infusions:  ceFEPime (MAXIPIME) IV 2 g (11/06/23 0528)   heparin 500 Units/hr (11/04/23 1637)    PRN Medications: acetaminophen, HYDROmorphone, hydrOXYzine, ketorolac, mouth rinse, sodium chloride flush, traZODone   Patient Profile   Joel York is a 37 yo male with PMH of 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 Now  presenting with driveline and PICC line infection.   Assessment/Plan:   1. 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 Went for I&D/wound vac change x3. Discharged home with 6 weeks of IV abx.  - Now re-admitted with persistent driveline infection.  - Wound culture from 11/1 grew MRSA. Corrected to MSSA on 11/12. Dapto stopped. - CT C/A/P 11/06 w/ small amount of periprosthetic fluid within the left pleural space adjacent to the proximal portion of the power cord.  - Wound culture from 11/7 with S. aureus and Enterobacter cloacae - ID following. IV cefepime. PICC replaced 11/02/23. Will likely require long duration of IV antibiotics. - Tentative plan to return to OR for I&D 11/15   2. PICC line infection - PICC pulled in clinic 10/27/23 - abx per above. ID following  - PICC replaced 11/02/23   3. 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 Echo EF <20%,  RV mildly reduced, - Etiology uncertain. ? 2/2 PVCs vs genetically mediated +/- hypertension.  - LHC: normal coronaries.  - cMRI:  LV markedly dilated EF 10% RV 17% NICM - Underwent HM-III VAD on 07/28/25. Much improved NYHA I-II  - INR 1.3, goal INR 1.5 -2 on warfarin while awaiting driveline site debridement, also low dose heparin.  - More PI events than his normal overnight. PI up to 9 at times. Flows okay and MAPs 70s-80s. Some events possibly correlate with episodes of pain. Discussing with VAD coordinators.  - Continue digoxin 0.125 mg daily - Continue Losartan 25 mg BID (dose reduced 11/12 d/t soft MAP) - Continue spiro 25 mg daily - Continue Ivabradine 5 BID   4. 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 Hx drug abuse/Tobacco abuse - reports quit smoking 7/24, cotinine level at 30.1 09/22/23 - Continue suboxone and PRN dilaudid q 6hrs - Follows with pain clinic    Length of Stay: 9  [name], [name], PA-C 11/06/2023, 7:01  AM  VAD Team --- VAD ISSUES ONLY--- Pager [phone] (7am - 7am)  Advanced Heart Failure Team  Pager [phone] (M-F; 7a - 5p)  Please contact [facility] for night-coverage after hours (5p -7a ) and weekends on [email or web address]   Patient seen and examined with the above-signed Advanced Practice Provider and/or Housestaff. I personally reviewed laboratory data, imaging studies and relevant notes. I independently examined the patient and formulated the important aspects of the plan. I have edited the note to reflect any of my changes or salient points. I have personally discussed the plan with the patient and/or  family.  Remains on IV abx. Wound vac. Afebrile.   On heparin/warfarin. INR 1.3. No bleeding  Still c/o about site pain.   General:  NAD.  HEENT: normal  Neck: supple. JVP not elevated.  Carotids 2+ bilat; no bruits. No lymphadenopathy or thryomegaly appreciated. Cor: LVAD hum.  Lungs: Clear. Abdomen:  soft, + tender, non-distended. No hepatosplenomegaly. No bruits or masses. Good bowel sounds. Driveline site wih wound vac  Anchor in place.  Extremities: no cyanosis, clubbing, rash. Warm no edema  Neuro: alert & oriented x 3. No focal deficits. Moves all 4 without problem   Continue IV abx and wound vac. For OR washout tomorrow (d/w Dr. Donata Clay)  Continue heparin/warfarin   VAD interrogated personally. Parameters stable.   [name], MD  5:20 PM

## 2023-11-06 NOTE — Plan of Care (Signed)
  Problem: Education: Goal: Knowledge of General Education information will improve Description Including pain rating scale, medication(s)/side effects and non-pharmacologic comfort measures Outcome: Progressing   Problem: Nutrition: Goal: Adequate nutrition will be maintained Outcome: Progressing   Problem: Coping: Goal: Level of anxiety will decrease Outcome: Progressing   Problem: Elimination: Goal: Will not experience complications related to urinary retention Outcome: Progressing   Problem: Safety: Goal: Ability to remain free from injury will improve Outcome: Progressing   

## 2023-11-06 NOTE — Progress Notes (Signed)
PHARMACY - ANTICOAGULATION CONSULT NOTE  Pharmacy Consult for heparin and warfarin Indication: LVAD  Allergies  Allergen Reactions   Chlorhexidine Dermatitis and Rash   Other Anaphylaxis    Tree Nuts   Peanuts [Peanut Oil] Anaphylaxis    Patient Measurements: Height: 5\' 7"  (170.2 cm) Weight: 65.5 kg (144 lb 6.4 oz) (Scale A) IBW/kg (Calculated) : 66.1 Heparin Dosing Weight: 64 kg  Vital Signs: Temp: 97.6 F (36.4 C) (11/14 1146) Temp Source: Oral (11/14 1146) BP: 102/78 (11/14 1146) Pulse Rate: 74 (11/14 0845)  Labs: Recent Labs    11/04/23 0334 11/05/23 0505 11/06/23 0530 11/06/23 0614  HGB 10.2* 11.9* 12.7*  --   HCT 32.0* 36.9* 39.5  --   PLT 228 282 291  --   LABPROT 18.7* 15.6* 16.8*  --   INR 1.5* 1.2 1.3*  --   HEPARINUNFRC <0.10* <0.10*  --  <0.10*  CREATININE 0.69 0.69 0.99  --     Estimated Creatinine Clearance: 95.6 mL/min (by C-G formula based on SCr of 0.99 mg/dL).   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 Patient had significant bleeding at driveline site on 09/30/23 requiring FFP. PTA Warfarin is on hold. Pharmacy consulted for heparin.   INR 1.3 Hgb 11.9, plt 282. LDH stable at 145. No s/sx of bleeding. Discussed with Dr Donata Clay, will continue heparin infusion 500uts/hr low fixed rate,  warfarin with low goal 1.5-2 until no longer planning for debridements. Heparin level remains undetectable, <0.1 as expected.  Warfarin PTA: 3mg  MWF, 2mg  TTSS but INRs have been subtherapeutic.  Goal of Therapy:  Heparin level <0.3 units/ml - no titration unless directed by MD INR 2- 2.5 long term >> plan for 1.5-2 while undergoing debridements  Monitor platelets by anticoagulation protocol: Yes    Plan:  Continue heparin gtt at 500 units/hr Warfarin 2.5 mg tonight - repeat  Monitor daily heparin level, INR, CBC, and for s/sx of bleeding     Leota Sauers Pharm.D. CPP, BCPS Clinical Pharmacist [phone] 11/06/2023 3:50 PM   Please check AMION for all [facility] Pharmacy phone numbers After 10:00 PM, call Main Pharmacy [phone]

## 2023-11-06 NOTE — Plan of Care (Signed)
  Problem: Education: Goal: Patient will understand all VAD equipment and how it functions Outcome: Progressing Goal: Patient will be able to verbalize current INR target range and antiplatelet therapy for discharge home Outcome: Progressing   Problem: Cardiac: Goal: LVAD will function as expected and patient will experience no clinical alarms Outcome: Progressing   Problem: Education: Goal: Knowledge of General Education information will improve Description: Including pain rating scale, medication(s)/side effects and non-pharmacologic comfort measures Outcome: Progressing   Problem: Health Behavior/Discharge Planning: Goal: Ability to manage health-related needs will improve Outcome: Progressing   Problem: Clinical Measurements: Goal: Ability to maintain clinical measurements within normal limits will improve Outcome: Progressing Goal: Will remain free from infection Outcome: Progressing Goal: Diagnostic test results will improve Outcome: Progressing Goal: Respiratory complications will improve Outcome: Progressing Goal: Cardiovascular complication will be avoided Outcome: Progressing   Problem: Activity: Goal: Risk for activity intolerance will decrease Outcome: Progressing   Problem: Nutrition: Goal: Adequate nutrition will be maintained Outcome: Progressing   Problem: Coping: Goal: Level of anxiety will decrease Outcome: Progressing   Problem: Elimination: Goal: Will not experience complications related to bowel motility Outcome: Progressing Goal: Will not experience complications related to urinary retention Outcome: Progressing   Problem: Pain Management: Goal: General experience of comfort will improve Outcome: Progressing   Problem: Safety: Goal: Ability to remain free from injury will improve Outcome: Progressing   Problem: Skin Integrity: Goal: Risk for impaired skin integrity will decrease Outcome: Progressing

## 2023-11-07 ENCOUNTER — Encounter (HOSPITAL_COMMUNITY)
Admission: AD | Disposition: A | Discharge status: Home / Self Care | Payer: Self-pay | Source: Home / Self Care | Attending: Cardiology

## 2023-11-07 ENCOUNTER — Other Ambulatory Visit: Payer: Self-pay

## 2023-11-07 ENCOUNTER — Inpatient Hospital Stay (HOSPITAL_COMMUNITY): Payer: Medicaid Other | Admitting: Certified Registered Nurse Anesthetist

## 2023-11-07 ENCOUNTER — Encounter (HOSPITAL_COMMUNITY): Payer: Self-pay | Admitting: Cardiology

## 2023-11-07 DIAGNOSIS — I509 Heart failure, unspecified: Secondary | ICD-10-CM | POA: Diagnosis not present

## 2023-11-07 DIAGNOSIS — I5023 Acute on chronic systolic (congestive) heart failure: Secondary | ICD-10-CM | POA: Diagnosis not present

## 2023-11-07 DIAGNOSIS — T80212A Local infection due to central venous catheter, initial encounter: Secondary | ICD-10-CM | POA: Diagnosis not present

## 2023-11-07 DIAGNOSIS — T827XXA Infection and inflammatory reaction due to other cardiac and vascular devices, implants and grafts, initial encounter: Secondary | ICD-10-CM | POA: Diagnosis not present

## 2023-11-07 DIAGNOSIS — J45909 Unspecified asthma, uncomplicated: Secondary | ICD-10-CM | POA: Diagnosis not present

## 2023-11-07 HISTORY — PX: APPLICATION OF WOUND VAC: SHX5189

## 2023-11-07 HISTORY — PX: STERNAL WOUND DEBRIDEMENT: SHX1058

## 2023-11-07 LAB — CBC
HCT: 38.6 % — ABNORMAL LOW (ref 39.0–52.0)
Hemoglobin: 12.2 g/dL — ABNORMAL LOW (ref 13.0–17.0)
MCH: 26.7 pg (ref 26.0–34.0)
MCHC: 31.6 g/dL (ref 30.0–36.0)
MCV: 84.5 fL (ref 80.0–100.0)
Platelets: 273 10*3/uL (ref 150–400)
RBC: 4.57 MIL/uL (ref 4.22–5.81)
RDW: 13.8 % (ref 11.5–15.5)
WBC: 6.3 10*3/uL (ref 4.0–10.5)
nRBC: 0 % (ref 0.0–0.2)

## 2023-11-07 LAB — HEPARIN LEVEL (UNFRACTIONATED): Heparin Unfractionated: <0.1 [IU]/mL — ABNORMAL LOW (ref 0.30–0.70)

## 2023-11-07 LAB — BASIC METABOLIC PANEL
Anion gap: 8 (ref 5–15)
BUN: 24 mg/dL — ABNORMAL HIGH (ref 6–20)
CO2: 26 mmol/L (ref 22–32)
Calcium: 8.8 mg/dL — ABNORMAL LOW (ref 8.9–10.3)
Chloride: 100 mmol/L (ref 98–111)
Creatinine, Ser: 0.72 mg/dL (ref 0.61–1.24)
GFR, Estimated: >60 mL/min (ref >60)
Glucose, Bld: 104 mg/dL — ABNORMAL HIGH (ref 70–99)
Potassium: 4.1 mmol/L (ref 3.5–5.1)
Sodium: 134 mmol/L — ABNORMAL LOW (ref 135–145)

## 2023-11-07 LAB — PROTIME-INR
INR: 1.5 — ABNORMAL HIGH (ref 0.8–1.2)
Prothrombin Time: 18.1 s — ABNORMAL HIGH (ref 11.4–15.2)

## 2023-11-07 LAB — LACTATE DEHYDROGENASE: LDH: 132 U/L (ref 98–192)

## 2023-11-07 SURGERY — DEBRIDEMENT, WOUND, STERNUM
Anesthesia: General | Site: Abdomen

## 2023-11-07 MED ORDER — HYDROMORPHONE HCL 1 MG/ML IJ SOLN
INTRAMUSCULAR | Status: DC | PRN
  Administered 2023-11-07: .5 mg via INTRAVENOUS

## 2023-11-07 MED ORDER — OXYCODONE HCL 5 MG PO TABS: 5.0000 mg | ORAL_TABLET | Freq: Once | ORAL | Status: DC | PRN

## 2023-11-07 MED ORDER — CHLORHEXIDINE GLUCONATE 0.12 % MT SOLN
15.0000 mL | Freq: Once | OROMUCOSAL | Status: AC
  Administered 2023-11-07: 15 mL via OROMUCOSAL

## 2023-11-07 MED ORDER — ACETAMINOPHEN 10 MG/ML IV SOLN
1000.0000 mg | Freq: Once | INTRAVENOUS | Status: DC | PRN
Start: 2023-11-07 — End: 2023-11-07

## 2023-11-07 MED ORDER — PROPOFOL 10 MG/ML IV BOLUS
INTRAVENOUS | Status: DC | PRN
  Administered 2023-11-07: 30 mg via INTRAVENOUS

## 2023-11-07 MED ORDER — ORAL CARE MOUTH RINSE: 15.0000 mL | Freq: Once | OROMUCOSAL | Status: AC

## 2023-11-07 MED ORDER — SUGAMMADEX SODIUM 200 MG/2ML IV SOLN
INTRAVENOUS | Status: DC | PRN
  Administered 2023-11-07: 200 mg via INTRAVENOUS

## 2023-11-07 MED ORDER — ONDANSETRON HCL 4 MG/2ML IJ SOLN
INTRAMUSCULAR | Status: DC | PRN
  Administered 2023-11-07: 4 mg via INTRAVENOUS

## 2023-11-07 MED ORDER — ACETAMINOPHEN 325 MG PO TABS: 325.0000 mg | ORAL_TABLET | ORAL | Status: DC | PRN

## 2023-11-07 MED ORDER — MIDAZOLAM HCL 2 MG/2ML IJ SOLN
INTRAMUSCULAR | Status: AC
  Filled 2023-11-07: qty 2

## 2023-11-07 MED ORDER — VASOPRESSIN 20 UNIT/ML IV SOLN
INTRAVENOUS | Status: AC
  Filled 2023-11-07: qty 1

## 2023-11-07 MED ORDER — ROCURONIUM BROMIDE 10 MG/ML (PF) SYRINGE
PREFILLED_SYRINGE | INTRAVENOUS | Status: DC | PRN
  Administered 2023-11-07: 20 mg via INTRAVENOUS
  Administered 2023-11-07: 50 mg via INTRAVENOUS

## 2023-11-07 MED ORDER — DEXAMETHASONE SODIUM PHOSPHATE 10 MG/ML IJ SOLN
INTRAMUSCULAR | Status: DC | PRN
  Administered 2023-11-07: 10 mg via INTRAVENOUS

## 2023-11-07 MED ORDER — SODIUM CHLORIDE (PF) 0.9 % IJ SOLN
INTRAMUSCULAR | Status: AC
  Filled 2023-11-07: qty 30

## 2023-11-07 MED ORDER — LACTATED RINGERS IV SOLN
INTRAVENOUS | Status: DC
Start: 2023-11-07 — End: 2023-11-07

## 2023-11-07 MED ORDER — FENTANYL CITRATE (PF) 250 MCG/5ML IJ SOLN
INTRAMUSCULAR | Status: AC
  Filled 2023-11-07: qty 5

## 2023-11-07 MED ORDER — FENTANYL CITRATE (PF) 100 MCG/2ML IJ SOLN: 25.0000 ug | INTRAMUSCULAR | Status: DC | PRN

## 2023-11-07 MED ORDER — OXYCODONE HCL 5 MG/5ML PO SOLN: 5.0000 mg | Freq: Once | ORAL | Status: DC | PRN

## 2023-11-07 MED ORDER — WARFARIN SODIUM 2.5 MG PO TABS
2.5000 mg | ORAL_TABLET | Freq: Once | ORAL | Status: AC
  Administered 2023-11-07: 2.5 mg via ORAL
  Filled 2023-11-07: qty 1

## 2023-11-07 MED ORDER — MIDAZOLAM HCL 2 MG/2ML IJ SOLN
INTRAMUSCULAR | Status: DC | PRN
  Administered 2023-11-07: 2 mg via INTRAVENOUS

## 2023-11-07 MED ORDER — DEXAMETHASONE SODIUM PHOSPHATE 10 MG/ML IJ SOLN
INTRAMUSCULAR | Status: AC
  Filled 2023-11-07: qty 1

## 2023-11-07 MED ORDER — PROPOFOL 10 MG/ML IV BOLUS
INTRAVENOUS | Status: AC
  Filled 2023-11-07: qty 20

## 2023-11-07 MED ORDER — FENTANYL CITRATE (PF) 250 MCG/5ML IJ SOLN
INTRAMUSCULAR | Status: DC | PRN
  Administered 2023-11-07 (×5): 50 ug via INTRAVENOUS

## 2023-11-07 MED ORDER — ACETAMINOPHEN 160 MG/5ML PO SOLN: 325.0000 mg | ORAL | Status: DC | PRN

## 2023-11-07 MED ORDER — ONDANSETRON HCL 4 MG/2ML IJ SOLN
INTRAMUSCULAR | Status: AC
  Filled 2023-11-07: qty 2

## 2023-11-07 MED ORDER — ETOMIDATE 2 MG/ML IV SOLN
INTRAVENOUS | Status: DC | PRN
  Administered 2023-11-07: 8 mg via INTRAVENOUS

## 2023-11-07 MED ORDER — HYDROMORPHONE HCL 1 MG/ML IJ SOLN
2.0000 mg | INTRAMUSCULAR | Status: AC | PRN
  Administered 2023-11-07 – 2023-11-08 (×6): 2 mg via INTRAVENOUS
  Filled 2023-11-07 (×6): qty 2

## 2023-11-07 MED ORDER — HYDROMORPHONE HCL 1 MG/ML IJ SOLN
INTRAMUSCULAR | Status: AC
  Filled 2023-11-07: qty 0.5

## 2023-11-07 MED ORDER — 0.9 % SODIUM CHLORIDE (POUR BTL) OPTIME
TOPICAL | Status: DC | PRN
  Administered 2023-11-07: 2000 mL

## 2023-11-07 SURGICAL SUPPLY — 72 items
ATTRACTOMAT 16X20 MAGNETIC DRP (DRAPES) ×2 IMPLANT
BAG DECANTER FOR FLEXI CONT (MISCELLANEOUS) ×2 IMPLANT
BENZOIN TINCTURE PRP APPL 2/3 (GAUZE/BANDAGES/DRESSINGS) IMPLANT
BLADE CLIPPER SURG (BLADE) ×2 IMPLANT
BLADE SURG 10 STRL SS (BLADE) ×2 IMPLANT
BLADE SURG 15 STRL LF DISP TIS (BLADE) IMPLANT
BLADE SURG 15 STRL SS (BLADE) ×2
BNDG GAUZE DERMACEA FLUFF 4 (GAUZE/BANDAGES/DRESSINGS) IMPLANT
CANISTER SUCT 3000ML PPV (MISCELLANEOUS) ×2 IMPLANT
CANISTER WOUND CARE 500ML ATS (WOUND CARE) ×2 IMPLANT
CATH FOLEY 2WAY SLVR 5CC 16FR (CATHETERS) IMPLANT
CATH THORACIC 28FR RT ANG (CATHETERS) IMPLANT
CATH THORACIC 36FR (CATHETERS) IMPLANT
CLEANSER WND VASHE INSTL 34OZ (WOUND CARE) IMPLANT
CLIP TI WIDE RED SMALL 24 (CLIP) IMPLANT
CNTNR URN SCR LID CUP LEK RST (MISCELLANEOUS) IMPLANT
CONN Y 3/8X3/8X3/8 BEN (MISCELLANEOUS) IMPLANT
CONT SPEC 4OZ STRL OR WHT (MISCELLANEOUS)
CONTAINER PROTECT SURGISLUSH (MISCELLANEOUS) ×4 IMPLANT
COVER SURGICAL LIGHT HANDLE (MISCELLANEOUS) ×4 IMPLANT
DRAPE DERMATAC (DRAPES) IMPLANT
DRAPE LAPAROSCOPIC ABDOMINAL (DRAPES) ×2 IMPLANT
DRAPE SLUSH/WARMER DISC (DRAPES) IMPLANT
DRAPE WARM FLUID 44X44 (DRAPES) IMPLANT
DRSG AQUACEL AG ADV 3.5X14 (GAUZE/BANDAGES/DRESSINGS) ×2 IMPLANT
DRSG CUTIMED SORBACT 7X9 (GAUZE/BANDAGES/DRESSINGS) IMPLANT
DRSG VAC GRANUFOAM LG (GAUZE/BANDAGES/DRESSINGS) ×2 IMPLANT
DRSG VAC GRANUFOAM MED (GAUZE/BANDAGES/DRESSINGS) ×2 IMPLANT
DRSG VAC GRANUFOAM SM (GAUZE/BANDAGES/DRESSINGS) ×2 IMPLANT
ELECT REM PT RETURN 9FT ADLT (ELECTROSURGICAL) ×2
ELECTRODE REM PT RTRN 9FT ADLT (ELECTROSURGICAL) ×2 IMPLANT
GAUZE 4X4 16PLY ~~LOC~~+RFID DBL (SPONGE) ×2 IMPLANT
GAUZE PAD ABD 8X10 STRL (GAUZE/BANDAGES/DRESSINGS) IMPLANT
GAUZE SPONGE 4X4 12PLY STRL (GAUZE/BANDAGES/DRESSINGS) ×2 IMPLANT
GAUZE XEROFORM 5X9 LF (GAUZE/BANDAGES/DRESSINGS) IMPLANT
GLOVE BIO SURGEON STRL SZ7.5 (GLOVE) ×4 IMPLANT
GOWN STRL REUS W/ TWL LRG LVL3 (GOWN DISPOSABLE) ×8 IMPLANT
GOWN STRL REUS W/TWL LRG LVL3 (GOWN DISPOSABLE) ×8
GRAFT MYRIAD 7X10 (Graft) IMPLANT
HANDPIECE INTERPULSE COAX TIP (DISPOSABLE) ×2
HEMOSTAT POWDER SURGIFOAM 1G (HEMOSTASIS) IMPLANT
HEMOSTAT SURGICEL 2X14 (HEMOSTASIS) IMPLANT
KIT BASIN OR (CUSTOM PROCEDURE TRAY) ×2 IMPLANT
KIT SUCTION CATH 14FR (SUCTIONS) IMPLANT
KIT TURNOVER KIT B (KITS) ×2 IMPLANT
NS IRRIG 1000ML POUR BTL (IV SOLUTION) ×2 IMPLANT
PACK CHEST (CUSTOM PROCEDURE TRAY) ×2 IMPLANT
PACK GENERAL/GYN (CUSTOM PROCEDURE TRAY) ×2 IMPLANT
PAD ARMBOARD 7.5X6 YLW CONV (MISCELLANEOUS) ×4 IMPLANT
SET HNDPC FAN SPRY TIP SCT (DISPOSABLE) ×2 IMPLANT
SOL PREP POV-IOD 4OZ 10% (MISCELLANEOUS) IMPLANT
SPONGE T-LAP 18X18 ~~LOC~~+RFID (SPONGE) ×10 IMPLANT
SPONGE T-LAP 4X18 ~~LOC~~+RFID (SPONGE) ×2 IMPLANT
STAPLER VISISTAT 35W (STAPLE) IMPLANT
SUT ETHILON 3 0 FSL (SUTURE) IMPLANT
SUT STEEL 6MS V (SUTURE) IMPLANT
SUT STEEL STERNAL CCS#1 18IN (SUTURE) IMPLANT
SUT STEEL SZ 6 DBL 3X14 BALL (SUTURE) IMPLANT
SUT VIC AB 1 CTX 36 (SUTURE) ×4
SUT VIC AB 1 CTX36XBRD ANBCTR (SUTURE) ×4 IMPLANT
SUT VIC AB 2-0 CTX 27 (SUTURE) ×4 IMPLANT
SUT VIC AB 3-0 X1 27 (SUTURE) ×4 IMPLANT
SWAB COLLECTION DEVICE MRSA (MISCELLANEOUS) IMPLANT
SWAB CULTURE ESWAB REG 1ML (MISCELLANEOUS) IMPLANT
SYR 5ML LL (SYRINGE) IMPLANT
SYR BULB IRRIG 60ML STRL (SYRINGE) IMPLANT
TOWEL GREEN STERILE (TOWEL DISPOSABLE) ×2 IMPLANT
TOWEL GREEN STERILE FF (TOWEL DISPOSABLE) ×2 IMPLANT
TRAY FOLEY MTR SLVR 16FR STAT (SET/KITS/TRAYS/PACK) IMPLANT
TUBE CONNECTING 12X1/4 (SUCTIONS) IMPLANT
WATER STERILE IRR 1000ML POUR (IV SOLUTION) ×2 IMPLANT
YANKAUER SUCT BULB TIP NO VENT (SUCTIONS) IMPLANT

## 2023-11-07 NOTE — Progress Notes (Signed)
[facility] Infectious Diseases Follow Up Note  Patient Identification: Patient Name: Joel York MRN: [id number] Admit Date: 10/28/2023  4:25 PM Age: 37 y.o.Today's Date: 11/07/2023   Reason for Visit: LVAD infection  Principal Problem:   Infection associated with driveline of left ventricular assist device (LVAD) (HCC)  Antibiotics:  Vancomycin 11/5, daptomycin 11/6-c Cefepime 11/5-11/6, cefepime 11/10-c   Lines/Hardware: Left arm PICC   Interval Events: Remains afebrile, labs today with no significant abnormality in CBC and BMP   Assessment 63 Y O male with HTN, asthma, drug abuse ( now on suboxone* 5 years), seizures, 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 PIC pulled in clinic.  # Persistent driveline infection -11/1  wound cx MSSA - 11/6 CT  No associated drainable fluid collection identified. Mild splenomegaly  - 11/7 I&D, wound VAC placement. OR cx Enterobacter, MSSA   Recommendations - Continue cefepime - Plan for repeat OR later today  - Expect 4-6 weeks of IV cefepime to be followed by suppression  - Monitor CBC and BMP on abtx Dr Renold Don covering this weekend and will fu OR note as well as any cultures. New ID team on Monday .  Rest of the management as per the primary team. Thank you for the consult. Please page with pertinent questions or concerns.  ______________________________________________________________________ Subjective patient seen and examined at the bedside. NO complaints   Vitals BP 115/82 (BP Location: Right Arm)   Pulse 70   Temp 97.8 F (36.6 C) (Oral)   Resp 12   Ht 5\' 7"  (1.702 m)   Wt 65.2 kg   SpO2 98%   BMI 22.51 kg/m     Physical Exam Constitutional: Adult male lying in the bed, nontoxic-appearing and  comfortable    Comments: HEENT WNL  Cardiovascular:     Rate and Rhythm: Mechanical heart sounds, LVAD hum    heart sounds:   Pulmonary:     Effort: Pulmonary effort is normal.     Comments:   Abdominal:     Palpations: Abdomen is soft.     Tenderness: Driveline site securing a bandage C/D/I, wound VAC present  Musculoskeletal:        General: No swelling or tenderness in peripheral joints  Skin:    Comments: No rashes, PICC with no concerns  Neurological:     General: Awake, alert and oriented, following commands  Psychiatric:        Mood and Affect: Mood normal.    Pertinent Microbiology Results for orders placed or performed during the hospital encounter of 10/28/23  Culture, blood (Routine X 2) w Reflex to ID Panel     Status: None   Collection Time: 10/29/23  3:32 PM   Specimen: BLOOD  Result Value Ref Range Status   Specimen Description BLOOD SITE NOT SPECIFIED  Final   Special Requests   Final    BOTTLES DRAWN AEROBIC AND ANAEROBIC Blood Culture adequate volume   Culture   Final    NO GROWTH 5 DAYS Performed at [facility], [address] [place], [place] [address]    Report Status 11/03/2023 FINAL  Final  Culture, blood (Routine X 2) w Reflex to ID Panel     Status: None   Collection Time: 10/29/23  3:32 PM   Specimen: BLOOD  Result Value  Ref Range Status   Specimen Description BLOOD SITE NOT SPECIFIED  Final   Special Requests   Final    BOTTLES DRAWN AEROBIC AND ANAEROBIC Blood Culture results may not be optimal due to an inadequate volume of blood received in culture bottles   Culture   Final    NO GROWTH 5 DAYS Performed at [facility], [address] [place], [place] [address]    Report Status 11/03/2023 FINAL  Final  SARS Coronavirus 2 by RT PCR (hospital order, performed in [facility] hospital lab) *cepheid single result test* Anterior Nasal Swab     Status: None   Collection Time: 10/29/23  5:30 PM   Specimen: Anterior Nasal  Swab  Result Value Ref Range Status   SARS Coronavirus 2 by RT PCR NEGATIVE NEGATIVE Final    Comment: Performed at [facility], [address] [place], [place] [address]  Surgical pcr screen     Status: Abnormal   Collection Time: 10/30/23  6:08 AM   Specimen: Nasal Mucosa; Nasal Swab  Result Value Ref Range Status   MRSA, PCR NEGATIVE NEGATIVE Final   Staphylococcus aureus POSITIVE (A) NEGATIVE Final    Comment: (NOTE) The Xpert SA Assay (FDA approved for NASAL specimens in patients 33 years of age and older), is one component of a comprehensive surveillance program. It is not intended to diagnose infection nor to guide or monitor treatment. Performed at [facility], [address], [place], [place] [address]   Aerobic/Anaerobic Culture w Gram Stain (surgical/deep wound)     Status: None   Collection Time: 10/30/23  2:21 PM   Specimen: Wound  Result Value Ref Range Status   Specimen Description WOUND ABDOMEN  Final   Special Requests NONE  Final   Gram Stain NO WBC SEEN RARE GRAM POSITIVE COCCI IN PAIRS   Final   Culture   Final    RARE STAPHYLOCOCCUS AUREUS RARE ENTEROBACTER CLOACAE NO ANAEROBES ISOLATED Performed at [facility], [address], [place], [place] [address]    Report Status 11/04/2023 FINAL  Final   Organism ID, Bacteria STAPHYLOCOCCUS AUREUS  Final   Organism ID, Bacteria ENTEROBACTER CLOACAE  Final      Susceptibility   Enterobacter cloacae - MIC*    CEFEPIME <=0.12 SENSITIVE Sensitive     CEFTAZIDIME <=1 SENSITIVE Sensitive     CIPROFLOXACIN <=0.25 SENSITIVE Sensitive     GENTAMICIN <=1 SENSITIVE Sensitive     IMIPENEM 0.5 SENSITIVE Sensitive     TRIMETH/SULFA <=20 SENSITIVE Sensitive     PIP/TAZO <=4 SENSITIVE Sensitive ug/mL    * RARE ENTEROBACTER CLOACAE   Staphylococcus aureus - MIC*    CIPROFLOXACIN <=0.5 SENSITIVE Sensitive     ERYTHROMYCIN >=8 RESISTANT Resistant     GENTAMICIN <=0.5 SENSITIVE Sensitive     OXACILLIN  0.5 SENSITIVE Sensitive     TETRACYCLINE <=1 SENSITIVE Sensitive     VANCOMYCIN 1 SENSITIVE Sensitive     TRIMETH/SULFA <=10 SENSITIVE Sensitive     CLINDAMYCIN <=0.25 SENSITIVE Sensitive     RIFAMPIN <=0.5 SENSITIVE Sensitive     Inducible Clindamycin NEGATIVE Sensitive     LINEZOLID 2 SENSITIVE Sensitive     * RARE STAPHYLOCOCCUS AUREUS    Pertinent Lab.    Latest Ref Rng & Units 11/07/2023    5:12 AM 11/06/2023    5:30 AM 11/05/2023    5:05 AM  CBC  WBC 4.0 - 10.5 K/uL 6.3  7.6  6.8   Hemoglobin 13.0 - 17.0 g/dL 16.1  09.6  04.5   Hematocrit 39.0 - 52.0 % 38.6  39.5  36.9   Platelets 150 - 400 K/uL 273  291  282       Latest Ref Rng & Units 11/07/2023    5:12 AM 11/06/2023    5:30 AM 11/05/2023    5:05 AM  CMP  Glucose 70 - 99 mg/dL 409  93  811   BUN 6 - 20 mg/dL 24  13  14    Creatinine 0.61 - 1.24 mg/dL 9.14  7.82  9.56   Sodium 135 - 145 mmol/L 134  134  135   Potassium 3.5 - 5.1 mmol/L 4.1  4.3  4.2   Chloride 98 - 111 mmol/L 100  99  102   CO2 22 - 32 mmol/L 26  28  25    Calcium 8.9 - 10.3 mg/dL 8.8  9.1  9.1      Pertinent Imaging today Plain films and CT images have been personally visualized and interpreted; radiology reports have been reviewed. Decision making incorporated into the Impression /   [place] EKG SITE RITE  Result Date: 11/02/2023 If Site Rite image not attached, placement could not be confirmed due to current cardiac rhythm.  CT CHEST ABDOMEN PELVIS W CONTRAST  Result Date: 10/29/2023 CLINICAL DATA:  Sepsis EXAM: CT CHEST, ABDOMEN, AND PELVIS WITH CONTRAST TECHNIQUE: Multidetector CT imaging of the chest, abdomen and pelvis was performed following the standard protocol during bolus administration of intravenous contrast. RADIATION DOSE REDUCTION: This exam was performed according to the departmental dose-optimization program which includes automated exposure control, adjustment of the mA and/or kV according to patient size and/or use of iterative  reconstruction technique. CONTRAST:  75mL OMNIPAQUE IOHEXOL 350 MG/ML SOLN COMPARISON:  09/30/2023 FINDINGS: CT CHEST FINDINGS Cardiovascular: Left ventricular assist device is in place. Outflow cannula appears patent without periprostatic fluid collection identified. Wide patency of the outflow cannula-ascending aorta anastomosis. Power cord exits the left parasagittal mid abdomen with a small amount of periprosthetic fluid within the left pleural space adjacent to the proximal portion of the cord. There is probable packing material seen at the cord exit site as well as just proximal to this on image # 68/3 where the cord appears at the base of a shallow ulcer. This is best seen on image # 102/4. Cardiac size is within normal limits. No pericardial effusion. Central pulmonary arteries are of normal caliber. Thoracic aorta is otherwise unremarkable. Mediastinum/Nodes: No enlarged mediastinal, hilar, or axillary lymph nodes. Thyroid gland, trachea, and esophagus demonstrate no significant findings. Lungs/Pleura: Bibasilar atelectasis. No confluent pulmonary infiltrate. No pneumothorax or significant pleural effusion. Central airways are widely patent. Musculoskeletal: No chest wall mass or suspicious bone lesions identified. CT ABDOMEN PELVIS FINDINGS Hepatobiliary: No focal liver abnormality is seen. Status post cholecystectomy. No biliary dilatation. Pancreas: Unremarkable Spleen: Mild splenomegaly with the spleen measuring 15.2 cm in greatest dimension. No intrasplenic lesions are seen. Splenic vein is patent. Adrenals/Urinary Tract: The adrenal glands are unremarkable. The kidneys are normal in size and position. Punctate cortical calcifications seen within the interpolar region of the left kidney likely relates to remote trauma or inflammation. The kidneys are otherwise unremarkable. Bladder unremarkable. Stomach/Bowel: Stomach is within normal limits. Appendix appears normal. No evidence of bowel wall  thickening, distention, or inflammatory changes. Vascular/Lymphatic: No significant vascular findings are present. No enlarged abdominal or pelvic lymph nodes. Reproductive: Prostate is unremarkable. Other: No abdominal  wall hernia.  No abdominopelvic ascites. Musculoskeletal: No acute or significant osseous findings. IMPRESSION: 1. Left ventricular assist device in place. Outflow cannula appears patent without periprostatic fluid collection identified. Wide patency of the outflow cannula-ascending aorta anastomosis. 2. Power cord exits the left parasagittal mid abdomen with a small amount of periprosthetic fluid within the left pleural space adjacent to the proximal portion of the cord. Subjacent to an anterior abdominal wall dressing, there is probable packing material seen at the cord exit site as well as just proximal to this where the cord appears at the base of a shallow ulcer. No associated drainable fluid collection identified. 3. Mild splenomegaly. Electronically Signed   By: Helyn Numbers M.D.   On: 10/29/2023 18:54   IR PICC PLACEMENT RIGHT >5 YRS INC IMG GUIDE  Result Date: 10/24/2023 INDICATION: 37 year old male with malpositioned peripherally inserted central catheter. Request made for exchange. EXAM: FLUOROSCOPIC GUIDED PICC LINE EXCHANGE MEDICATIONS: None. CONTRAST:  None ANESTHESIA/SEDATION: None FLUOROSCOPY TIME:  Radiation Exposure Index (as provided by the fluoroscopic device): 0.2 mGy Kerma COMPLICATIONS: None immediate. TECHNIQUE: The procedure, risks, benefits, and alternatives were explained to the patient and informed written consent was obtained. The right upper extremity and external portion of the existing PICC line was prepped with chlorhexidine in a sterile fashion, and a sterile drape was applied covering the operative field. Maximum barrier sterile technique with sterile gowns and gloves were used for the procedure. A timeout was performed prior to the initiation of the  procedure. Local anesthesia was provided with 1% lidocaine. The existing PICC line was cannulated with an 0.0018 wire which was advanced through the catheter. The catheter was exchanged for a peel-away sheath, ultimately allowing advancement of a 39 -cm, 5 - [place], single lumen PICC line to the level of the superior caval atrial junction. A post procedure spot fluoroscopic image was obtained. The catheter easily aspirated and flushed and was sutured in place. A dressing was placed. The patient tolerated the procedure well without immediate post procedural complication. FINDINGS: After catheter exchange, the tip lies within the superior cavoatrial junction. The catheter aspirates and flushes normally and is ready for immediate use. IMPRESSION: Successful fluoroscopic guided exchange of right upper extremity approach 39 cm, 5 - [place], single lumen PICC with tip overlying the superior caval atrial junction. The PICC line is ready for immediate use. Performed by: Loyce Dys PA-C Electronically Signed   By: Gilmer Mor D.O.   On: 10/24/2023 15:57   [place] EKG SITE RITE  Result Date: 10/14/2023 If Site Rite image not attached, placement could not be confirmed due to current cardiac rhythm.   I have personally spent at least 50 minutes involved in face-to-face and non-face-to-face activities for this patient on the day of the visit. Professional time spent includes the following activities: Preparing to see the patient (review of tests), Obtaining and/or reviewing separately obtained history (admission/discharge record), Performing a medically appropriate examination and/or evaluation , Ordering medications/tests/procedures, referring and communicating with other health care professionals, Documenting clinical information in the EMR, Independently interpreting results (not separately reported), Communicating results to the patient/family/caregiver, Counseling and educating the patient/family/caregiver and Care  coordination (not separately reported).   Plan d/w requesting provider as well as ID pharm D  Of note, portions of this note may have been created with voice recognition software. While this note has been edited for accuracy, occasional wrong-word or 'sound-a-like' substitutions may have occurred due to the inherent limitations of voice recognition software.  Electronically signed by:   Odette Fraction, MD Infectious Disease Physician [facility] Pager: [phone]

## 2023-11-07 NOTE — Progress Notes (Signed)
VAD Coordinator Procedure Note:   VAD Coordinator met patient in SS 36. Pt undergoing Driveline debridement with wound vac change and washout per Dr. Maren Beach. Hemodynamics and VAD parameters monitored by myself and anesthesia throughout the procedure. Blood pressures were obtained with automatic cuff on right arm.    Time: Doppler Auto  BP Flow PI Power Speed  Pre-procedure:  1415  108/88(96) 3.9 5 4.3 5600                    Sedation Induction: 1452  92/50(58) 3.9 4.1 4.2 5600   1500  101/83(90) 4.3 3.4 4.2 5600   1515  113/83(93) 4.3 2.9 4.3 5600   1530  99/68(78) 4.5 2.5 4.3 5600   1545  83/57(66) 4.3 3.1 4.2 5600                    Recovery Area: 1556  112/83(92) 4.1 3.6 4.3 5600   1612  107/80(89) 4.1 3.4 4.2 5600    Patient tolerated the procedure well. VAD Coordinator accompanied and remained with patient in recovery area.    Patient Disposition: pt transported back to [facility] and handoff given to bedside nurse.  Carlton Adam RN, BSN VAD Coordinator 24/7 Pager [phone]

## 2023-11-07 NOTE — Progress Notes (Signed)
Pre Procedure note for inpatients:   Joel York has been scheduled for Procedure(s): VAD TUNNEL WOUND DEBRIDEMENT (N/A) WOUND VAC CHANGE (N/A) today. The various methods of treatment have been discussed with the patient. After consideration of the risks, benefits and treatment options the patient has consented to the planned procedure.   The patient has been seen and labs reviewed. There are no changes in the patient's condition to prevent proceeding with the planned procedure today.  Recent labs:  Lab Results  Component Value Date   WBC 6.3 11/07/2023   HGB 12.2 (L) 11/07/2023   HCT 38.6 (L) 11/07/2023   PLT 273 11/07/2023   GLUCOSE 104 (H) 11/07/2023   CHOL 74 07/15/2023   TRIG 140 07/15/2023   HDL 16 (L) 07/15/2023   LDLCALC 30 07/15/2023   ALT 8 10/28/2023   AST 16 10/28/2023   NA 134 (L) 11/07/2023   K 4.1 11/07/2023   CL 100 11/07/2023   CREATININE 0.72 11/07/2023   BUN 24 (H) 11/07/2023   CO2 26 11/07/2023   TSH 4.982 (H) 07/14/2023   INR 1.5 (H) 11/07/2023   HGBA1C 6.0 (H) 07/30/2023   MICROALBUR <3.0 (H) 07/14/2023    Lovett Sox, MD 11/07/2023 10:36 AM

## 2023-11-07 NOTE — Transfer of Care (Signed)
Immediate Anesthesia Transfer of Care Note  Patient: Joel York  Procedure(s) Performed: VAD TUNNEL WOUND DEBRIDEMENT (Abdomen) WOUND VAC CHANGE  Patient Location: PACU  Anesthesia Type:General  Level of Consciousness: awake, alert , and oriented  Airway & Oxygen Therapy: Patient Spontanous Breathing  Post-op Assessment: Report given to RN and Post -op Vital signs reviewed and stable  Post vital signs: Reviewed and stable  Last Vitals:  Vitals Value Taken Time  BP 112/83 11/07/23 1556  Temp 98.3   Pulse 73 11/07/23 1600  Resp 16 11/07/23 1600  SpO2 95 % 11/07/23 1600  Vitals shown include unfiled device data.  Last Pain:  Vitals:   11/07/23 1200  TempSrc:   PainSc: Asleep      Patients Stated Pain Goal: 2 (11/07/23 1127)  Complications: No notable events documented.

## 2023-11-07 NOTE — Plan of Care (Signed)
  Problem: Education: Goal: Patient will understand all VAD equipment and how it functions Outcome: Progressing Goal: Patient will be able to verbalize current INR target range and antiplatelet therapy for discharge home Outcome: Progressing   Problem: Cardiac: Goal: LVAD will function as expected and patient will experience no clinical alarms Outcome: Progressing   Problem: Education: Goal: Knowledge of General Education information will improve Description: Including pain rating scale, medication(s)/side effects and non-pharmacologic comfort measures Outcome: Progressing   Problem: Health Behavior/Discharge Planning: Goal: Ability to manage health-related needs will improve Outcome: Progressing   Problem: Clinical Measurements: Goal: Ability to maintain clinical measurements within normal limits will improve Outcome: Progressing Goal: Will remain free from infection Outcome: Progressing Goal: Diagnostic test results will improve Outcome: Progressing Goal: Respiratory complications will improve Outcome: Progressing Goal: Cardiovascular complication will be avoided Outcome: Progressing   Problem: Activity: Goal: Risk for activity intolerance will decrease Outcome: Progressing   Problem: Nutrition: Goal: Adequate nutrition will be maintained Outcome: Progressing   Problem: Coping: Goal: Level of anxiety will decrease Outcome: Progressing   Problem: Elimination: Goal: Will not experience complications related to bowel motility Outcome: Progressing Goal: Will not experience complications related to urinary retention Outcome: Progressing   Problem: Pain Management: Goal: General experience of comfort will improve Outcome: Progressing   Problem: Safety: Goal: Ability to remain free from injury will improve Outcome: Progressing   Problem: Skin Integrity: Goal: Risk for impaired skin integrity will decrease Outcome: Progressing

## 2023-11-07 NOTE — Anesthesia Procedure Notes (Signed)
Procedure Name: Intubation Date/Time: 11/07/2023 3:02 PM  Performed by: Randon Goldsmith, CRNAPre-anesthesia Checklist: Patient identified, Emergency Drugs available, Suction available and Patient being monitored Patient Re-evaluated:Patient Re-evaluated prior to induction Oxygen Delivery Method: Circle system utilized Preoxygenation: Pre-oxygenation with 100% oxygen Induction Type: IV induction Ventilation: Mask ventilation without difficulty Laryngoscope Size: Mac and 4 Grade View: Grade I Tube type: Oral Tube size: 7.5 mm Number of attempts: 1 Airway Equipment and Method: Stylet and Oral airway Placement Confirmation: ETT inserted through vocal cords under direct vision, positive ETCO2 and breath sounds checked- equal and bilateral Secured at: 22 cm Tube secured with: Tape Dental Injury: Teeth and Oropharynx as per pre-operative assessment

## 2023-11-07 NOTE — Op Note (Unsigned)
NAMEROCKLIN, OMURA MEDICAL RECORD NO: [id number] ACCOUNT NO: [id number] DATE OF BIRTH: 01/17/1986 FACILITY: [facility] LOCATION: [facility] PHYSICIAN: Kerin Perna III, MD  Operative Report   DATE OF PROCEDURE: 11/07/2023  OPERATION:   1.  Wound irrigation - washout of VAD tunnel wound.   2.  Wound VAC change out.   3.  Application of tissue Matrix Myriad sheet 7 x 2 cm.  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  ANESTHESIA:  General.  DESCRIPTION OF PROCEDURE:  The patient was checked in preop holding where informed consent was documented and the procedure was again reviewed with the patient including the expected benefits, risks, and alternatives.  The patient understood that the  procedure will help his abdominal wall heal and allow better quality of life and optimize the benefits from having the VAD for his chronic heart failure.  He understood the risks of persistent infection, bleeding, and pain.  He agreed to proceed with  surgery.    The patient was brought to the OR by anesthesia and the VAD coordinator who attended to the patient during the procedure, monitoring the VAD equipment, and assisting with hemodynamic management.  The patient was placed supine on the operating room table  and positioned.  He was induced for general anesthesia and intubated.  He remained stable.  The previously placed wound VAC sheet and sponge were removed.  The lower chest and abdomen were prepped with Betadine and draped as a sterile field.  A proper  timeout was performed.   The wound was examined.  It was 7 cm long x 1 cm deep x 3 cm wide.  There was no purulence and the power cord was incorporated into the subxiphoid tissue.  The wound was irrigated with a liter of Vashe irrigation solution.   Wound cultures had been taken  prior to the wound Vashe irrigation.  We then applied the Myriad tissue matrix sheath to the base of the wound underneath the power cord.  This was covered with Sorbact and tacked to the skin edges with #3-0 Vicryl.  Over this Sorbact mesh, the wound VAC  sponge was cut to the appropriate length and orientation and over that, the wound VAC sheets were placed and the system was secured.  The system was activated and there was good suction on the sponge and no leak from the seal test.  The patient was then  reversed from anesthesia and returned to the recovery room in stable condition.      SUJ D: 11/07/2023 4:20:28 pm T: 11/07/2023 8:55:00 pm  JOB: [id number]/ [id number]

## 2023-11-07 NOTE — Progress Notes (Addendum)
.Patient ID: Joel York, male   DOB: 02/18/1986, 37 y.o.   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    11/06: CT C/A/P: small amount of periprosthetic fluid within the left pleural space adjacent to the proximal portion of the power cord.  11/7: Wound culture with S aureus and Enterobacter cloacae.  11/10: New PICC 11/12: Wound culture 11/1 MRSA corrected to MSSA. Dapto stopped. Continued IV cefepime.  Back to OR today.   INR 1.5.  MAP 90s  Feels fine today. Apparently hasn't been getting out of bed much during the day 2/2 pain. Has been primarily sleeping.   VAD Interrogation: Flow 4 L/min, Speed 5600 RPM, Power 4.3 Watts, PI 3.6. 37 PI events  Objective:    Vital Signs:   Temp:  [97.6 F (36.4 C)-97.9 F (36.6 C)] 97.8 F (36.6 C) (11/15 0515) Pulse Rate:  [70-74] 70 (11/15 0515) Resp:  [10-15] 12 (11/15 0515) BP: (97-115)/(67-84) 115/82 (11/15 0515) SpO2:  [97 %-98 %] 98 % (11/15 0515) Weight:  [65.2 kg] 65.2 kg (11/15 0647) Last BM Date : 11/04/23 Mean arterial Pressure 90s  Intake/Output:   Intake/Output Summary (Last 24 hours) at 11/07/2023 0707 Last data filed at 11/07/2023 0529 Gross per 24 hour  Intake 1272.56 ml  Output 1300 ml  Net -27.44 ml    Physical Exam  General:  Well appearing. No resp difficulty HEENT: Normal Neck: supple. JVP flat. Carotids 2+ bilat; no bruits. No lymphadenopathy or thyromegaly appreciated. Cor: Mechanical heart sounds with LVAD hum present. Lungs: Clear Abdomen: soft, nontender, nondistended. No hepatosplenomegaly. No bruits or masses. Good bowel sounds. Driveline: C/D/I; securement device intact. +wound vac at DL exit site Extremities: no cyanosis, clubbing, rash, edema Neuro: alert & orientedx3, cranial nerves grossly intact. moves all 4 extremities w/o  difficulty. Affect pleasant   Telemetry   SR 70s (Personally reviewed)    EKG    N/A  Labs   Basic Metabolic Panel: Recent Labs  Lab 11/03/23 0523 11/04/23 0334 11/05/23 0505 11/06/23 0530 11/07/23 0512  NA 137 135 135 134* 134*  K 3.1* 3.4* 4.2 4.3 4.1  CL 112* 113* 102 99 100  CO2 23 23 25 28 26   GLUCOSE 114* 107* 122* 93 104*  BUN 8 9 14 13  24*  CREATININE 0.62 0.69 0.69 0.99 0.72  CALCIUM 7.5* 7.7* 9.1 9.1 8.8*    Liver Function Tests: No results for input(s): "AST", "ALT", "ALKPHOS", "BILITOT", "PROT", "ALBUMIN" in the last 168 hours.  No results for input(s): "LIPASE", "AMYLASE" in the last 168 hours. No results for input(s): "AMMONIA" in the last 168 hours.  CBC: Recent Labs  Lab 11/03/23 0523 11/04/23 0334 11/05/23 0505 11/06/23 0530 11/07/23 0512  WBC 5.7 5.6 6.8 7.6 6.3  HGB 10.1* 10.2* 11.9* 12.7* 12.2*  HCT 32.3* 32.0* 36.9* 39.5 38.6*  MCV 86.6 87.2 86.6 84.9 84.5  PLT 247 228 282 291 273    INR: Recent Labs  Lab 11/03/23 0523 11/04/23 0334 11/05/23 0505 11/06/23 0530 11/07/23 0512  INR 1.5* 1.5* 1.2 1.3* 1.5*    Other results: EKG:    Imaging   No results found.   Medications:     Scheduled Medications:  buprenorphine-naloxone  1 tablet Sublingual Daily   digoxin  0.125 mg Oral Daily   gabapentin  300 mg Oral TID  ivabradine  5 mg Oral BID WC   losartan  25 mg Oral BID   pantoprazole  40 mg Oral Daily   sertraline  25 mg Oral Daily   sodium chloride flush  10-40 mL Intracatheter Q12H   spironolactone  25 mg Oral Daily   Warfarin - Pharmacist Dosing Inpatient   Does not apply q1600   zinc sulfate (50mg  elemental zinc)  220 mg Oral Daily    Infusions:  ceFEPime (MAXIPIME) IV 2 g (11/07/23 0508)   heparin 500 Units/hr (11/06/23 1212)    PRN Medications: acetaminophen, HYDROmorphone, hydrOXYzine, ketorolac, mouth rinse, sodium chloride flush, traZODone   Patient Profile   Joel York is a 37 yo male with  PMH of 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 Now presenting with driveline and PICC line infection.   Assessment/Plan:   1. 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 Went for I&D/wound vac change x3. Discharged home with 6 weeks of IV abx.  - Now re-admitted with persistent driveline infection.  - Wound culture from 11/1 grew MRSA. Corrected to MSSA on 11/12. Dapto stopped. - CT C/A/P 11/06 w/ small amount of periprosthetic fluid within the left pleural space adjacent to the proximal portion of the power cord.  - Wound culture from 11/7 with S. aureus and Enterobacter cloacae - ID following. IV cefepime. PICC replaced 11/02/23. Will likely require long duration of IV antibiotics. - Plan to return to OR for I&D today   2. PICC line infection - PICC pulled in clinic 10/27/23 - abx per above. ID following  - PICC replaced 11/02/23   3. 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 Echo EF <20%,  RV mildly reduced, - Etiology uncertain. ? 2/2 PVCs vs genetically mediated +/- hypertension.  - LHC: normal coronaries.  - cMRI:  LV markedly dilated EF 10% RV 17% NICM - Underwent HM-III VAD on 07/28/25. Much improved NYHA I-II  - INR 1.5, goal INR 1.5 -2 on warfarin while awaiting driveline site debridement, also low dose heparin.  - Continue digoxin 0.125 mg daily - Continue Losartan 25 mg BID (dose reduced 11/12 d/t soft MAP) - Continue spiro 25 mg daily - Continue Ivabradine 5 BID   4. 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 Hx drug abuse/Tobacco abuse - reports quit smoking 7/24, cotinine level at 30.1 09/22/23 - Continue suboxone and PRN dilaudid q 6hrs - Follows with pain clinic   Length of Stay: 10  Alen Bleacher, NP 11/07/2023, 7:07 AM  VAD Team --- VAD ISSUES ONLY--- Pager [phone] (7am - 7am)  Advanced  Heart Failure Team  Pager [phone] (M-F; 7a - 5p)  Please contact [facility] for night-coverage after hours (5p -7a ) and weekends on [email or web address]  Patient seen and examined with the above-signed Advanced Practice Provider and/or Housestaff. I personally reviewed laboratory data, imaging studies and relevant notes. I independently examined the patient and formulated the important aspects of the plan. I have edited the note to reflect any of my changes or salient points. I have personally discussed the plan with the patient and/or family.  Remains on IV abx and wound vac. Afebrile  Also on heparin/warfarin. No bleeding INR 1.5  Still with pain at wound site  General:  NAD.  HEENT: normal  Neck: supple. JVP not elevated.  Carotids 2+ bilat; no  bruits. No lymphadenopathy or thryomegaly appreciated. Cor: LVAD hum.  Lungs: Clear. Abdomen: soft, + tender, non-distended. No hepatosplenomegaly. No bruits or masses. Good bowel sounds. Driveline site + wound vac with good seal  Anchor in place.  Extremities: no cyanosis, clubbing, rash. Warm no edema  Neuro: alert & oriented x 3. No focal deficits. Moves all 4 without problem   For OR washout today. Continue IV abx, Hopefully home soon.   VAD interrogated personally. Parameters stable.  Discussed warfarin dosing with PharmD personally.  Remains on high-dose narcotic regimen. Would not increase.   [name], MD  2:26 PM

## 2023-11-07 NOTE — Progress Notes (Signed)
Transported to short stay by bed awake and alert. LVAD coordinator made aware.

## 2023-11-07 NOTE — Anesthesia Preprocedure Evaluation (Addendum)
Anesthesia Evaluation  Patient identified by MRN, date of birth, ID band Patient awake    Reviewed: Allergy & Precautions, NPO status , Patient's Chart, lab work & pertinent test results  Airway Mallampati: II  TM Distance: >3 FB Neck ROM: Full    Dental  (+) Teeth Intact, Dental Advisory Given   Pulmonary asthma , Current Smoker   breath sounds clear to auscultation       Cardiovascular +CHF   Rhythm:Regular Rate:Normal     Neuro/Psych Seizures -,  PSYCHIATRIC DISORDERS Anxiety        GI/Hepatic Neg liver ROS,GERD  ,,  Endo/Other  negative endocrine ROS    Renal/GU Renal disease     Musculoskeletal negative musculoskeletal ROS (+)    Abdominal   Peds  Hematology negative hematology ROS (+)   Anesthesia Other Findings   Reproductive/Obstetrics                             Anesthesia Physical Anesthesia Plan  ASA: 4  Anesthesia Plan: General   Post-op Pain Management: Tylenol PO (pre-op)* and Gabapentin PO (pre-op)*   Induction: Intravenous  PONV Risk Score and Plan: 2 and Ondansetron, Dexamethasone and Midazolam  Airway Management Planned: Oral ETT  Additional Equipment: None  Intra-op Plan:   Post-operative Plan: Extubation in OR  Informed Consent: I have reviewed the patients History and Physical, chart, labs and discussed the procedure including the risks, benefits and alternatives for the proposed anesthesia with the patient or authorized representative who has indicated his/her understanding and acceptance.     Dental advisory given  Plan Discussed with: CRNA  Anesthesia Plan Comments: (No arterial line, BP cuff consistent.)       Anesthesia Quick Evaluation

## 2023-11-07 NOTE — Progress Notes (Signed)
Back from PACU awake and alert. 

## 2023-11-07 NOTE — TOC Progression Note (Signed)
Transition of Care [facility]) - Progression Note    Patient Details  Name: Joel York MRN: [id number] Date of Birth: 03-25-1986  [facility]) CM/SW Contact  Nicanor Bake Phone Number: [phone] 11/07/2023, 3:21 PM  Clinical Narrative: HF CSW attempted to meet with pt at bedside. Pt was out of the room for procedure. CSW will follow up with pt at a more appropriate time.   TOC will continue following.       Expected Discharge Plan: Home w Home Health Services Barriers to Discharge: Continued Medical Work up  Expected Discharge Plan and Services   Discharge Planning Services: CM Consult Post Acute Care Choice: Home Health Living arrangements for the past 2 months: Single Family Home                           HH Arranged: RN HH Agency: [facility] Date HH Agency Contacted: 11/05/23 Time HH Agency Contacted: 1058 Representative spoke with at [facility] Agency: [facility], Pam RN   Social Determinants of Health (SDOH) Interventions SDOH Screenings   Food Insecurity: No Food Insecurity (10/28/2023)  Housing: Low Risk  (10/28/2023)  Transportation Needs: No Transportation Needs (10/28/2023)  Utilities: Not At Risk (10/28/2023)  Alcohol Screen: Low Risk  (07/14/2023)  Financial Resource Strain: Medium Risk (08/12/2023)  Physical Activity: Sufficiently Active (07/14/2023)  Stress: Stress Concern Present (07/14/2023)  Tobacco Use: High Risk (11/07/2023)    Readmission Risk Interventions     No data to display

## 2023-11-07 NOTE — Progress Notes (Signed)
LVAD Coordinator Rounding Note:  Admitted 10/28/23 to heart failure service due to worsening driveline infection. Pt seen in VAD clinic 11/4- wound looked progressively worse. Decision was made to admit for further IV antibiotics and debridement.   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  Pt laying in bed. Continued pain at wound vac site.    Rash scabbed over on arms, neck, and back. Rash still present on abdomen but pt states this is less painful/itchy.   ID consulted. Cultures positive for Staph Aureus and Enterobacter Cloacae as of 10/30/23. Currently receiving Cefepime 2g q8h.  Pt had wound debridement and wound vac change today in OR. Will plan to take pt back to OR on Wednesday next week.  Vital signs: Temp: 98.3 HR: 68 Doppler Pressure: 80 Auto BP: 107/80 (89) O2 Sat: 96% on RA Wt: 141.1>141.5>145.9>150.2>154>152.6 >151>148.5>147.3>144.4>143.7lbs    LVAD interrogation reveals:  Speed: 5600 Flow: 4.2 Power: 4.3 w PI: 2.9  Alarms: none Events: 40 PI events today; 55 yesterday Hematocrit: 38  Fixed speed: 5600 Low speed limit: 5300  Drive Line: Wound vac dressing clean, dry, intact. Negative pressure -125. Anchor secure. Plan for wound vac change in OR Wednesday 11/20 with Dr Donata Clay.   Labs:  LDH trend: 133>134>124>146>145>132  INR trend: 2.4>1.7>1.3>1.5>1.4>1.5>1.2>1.3>1.5   WBC trend: 6.2>5.6>7.6>7.2>8.0>5.7>5.6>6.8>7.6>6.3  Anticoagulation Plan: -INR Goal: 2-2.5 -ASA Dose: off  Blood Products:  10/29/23>> 2 FFP  Infection:  10/24/23>>wound cx>>STAPH AUREUS; final 10/29/23>>blood cultures>> no growth 5 days 10/30/23>> wound cx>>RARE STAPHYLOCOCCUS AUREUS  RARE ENTEROBACTER CLOACAE; final  11/07/23>>driveline culture OR>>pending  Drips:  Heparin 500 units/hr  Adverse Events on VAD: - Admitted 09/29/23 with DL infection. Wound cx + staph aureus. Underwent I&D/wound vac change x3. ID saw, discharged home on 6 weeks of IV cefazolin (end  date 11/18/23).   Plan/Recommendations:  1. Please page VAD coordinator for any alarms or VAD equipment issues. 2. Page VAD coordinator for wound vac issues 3. VAD coordinator will accompany pt to OR Wednesday 11/20  Carlton Adam RN,BSN VAD Coordinator  Office: [phone]  24/7 Pager: [phone]

## 2023-11-07 NOTE — Progress Notes (Signed)
CCC Pre-op Review  Pre-op checklist: Completed   NPO: Yes  Labs: PCR + staph 10/30/23  Consent: Yes  H&P: Dr. Gala Romney  Vitals: WNL  O2 requirements: RA  MAR/PTA review: Order to shut off Heparin on call to OR. Maxipime due at 1245  IV: 22g, Double Lumen Picc  Floor nurse name:  Arvella Merles, RN  Additional info:

## 2023-11-07 NOTE — Progress Notes (Signed)
PHARMACY - ANTICOAGULATION CONSULT NOTE  Pharmacy Consult for heparin and warfarin Indication: LVAD  Allergies  Allergen Reactions   Chlorhexidine Dermatitis and Rash   Other Anaphylaxis    Tree Nuts   Peanuts [Peanut Oil] Anaphylaxis    Patient Measurements: Height: 5\' 7"  (170.2 cm) Weight: 65.2 kg (143 lb 11.8 oz) IBW/kg (Calculated) : 66.1 Heparin Dosing Weight: 64 kg  Vital Signs: Temp: 98.2 F (36.8 C) (11/15 1630) Temp Source: Oral (11/15 1112) BP: 98/88 (11/15 1630) Pulse Rate: 88 (11/15 1630)  Labs: Recent Labs    11/05/23 0505 11/06/23 0530 11/06/23 0614 11/07/23 0512  HGB 11.9* 12.7*  --  12.2*  HCT 36.9* 39.5  --  38.6*  PLT 282 291  --  273  LABPROT 15.6* 16.8*  --  18.1*  INR 1.2 1.3*  --  1.5*  HEPARINUNFRC <0.10*  --  <0.10* <0.10*  CREATININE 0.69 0.99  --  0.72    Estimated Creatinine Clearance: 117.7 mL/min (by C-G formula based on SCr of 0.72 mg/dL).   Medical History: Past Medical History:  Diagnosis Date   Acid reflux    ADHD (attention deficit hyperactivity disorder)    Asthma    Back pain    LVAD (left ventricular assist device) present (HCC)    Seizures (HCC)    Resolved     Assessment: 37 yo M with LVAD HMIII placed 07/29/23 c/b chronic driveline infection admitted for possible surgery 11/7. Patient had significant bleeding at driveline site on 09/30/23 requiring FFP. PTA Warfarin is on hold. Pharmacy consulted for heparin.   INR 1.3 Hgb 11.9, plt 282. LDH stable at 145. No s/sx of bleeding. Discussed with Dr Donata Clay, will continue heparin infusion 500uts/hr low fixed rate,  warfarin with low goal 1.5-2 until no longer planning for debridements. Heparin level remains undetectable, <0.1 as expected. S/p OR today for further debridement   Warfarin PTA: 3mg  MWF, 2mg  TTSS but INRs have been subtherapeutic.  Goal of Therapy:  Heparin level <0.3 units/ml - no titration unless directed by MD INR 2- 2.5 long term >> plan for 1.5-2  while undergoing debridements  Monitor platelets by anticoagulation protocol: Yes   Plan:  Continue heparin gtt at 500 units/hr Warfarin 2.5 mg tonight - repeat  Monitor daily heparin level, INR, CBC, and for s/sx of bleeding     Leota Sauers Pharm.D. CPP, BCPS Clinical Pharmacist [phone] 11/07/2023 5:43 PM   Please check AMION for all [facility] Pharmacy phone numbers After 10:00 PM, call Main Pharmacy [phone]

## 2023-11-07 NOTE — Brief Op Note (Signed)
11/07/2023  4:10 PM  PATIENT:  Joel York  37 y.o. male  PRE-OPERATIVE DIAGNOSIS:  DRIVELINE INFECTION  POST-OPERATIVE DIAGNOSIS:  DRIVELINE INFECTION  PROCEDURE:  Procedure(s): VAD TUNNEL WOUND DEBRIDEMENT (N/A) WOUND VAC CHANGE (N/A)  SURGEON:  Surgeons and Role:    Lovett Sox, MD - Primary  PHYSICIAN ASSISTANT:   ASSISTANTS: none   ANESTHESIA:   general  EBL:  5 mL   BLOOD ADMINISTERED:none  DRAINS: none   LOCAL MEDICATIONS USED:  NONE  SPECIMEN:  Scraping  DISPOSITION OF SPECIMEN:   Microbiology  COUNTS:  YES  TOURNIQUET:  * No tourniquets in log *  DICTATION: .Dragon Dictation  PLAN OF CARE:  Return to unit 2 Central  PATIENT DISPOSITION:  PACU - hemodynamically stable.   Delay start of Pharmacological VTE agent (>24hrs) due to surgical blood loss or risk of bleeding: yes Resume Coumadin dosing/low-dose heparin for planned discharge middle part of next week.

## 2023-11-08 DIAGNOSIS — T827XXA Infection and inflammatory reaction due to other cardiac and vascular devices, implants and grafts, initial encounter: Secondary | ICD-10-CM | POA: Diagnosis not present

## 2023-11-08 LAB — BASIC METABOLIC PANEL
Anion gap: 10 (ref 5–15)
BUN: 25 mg/dL — ABNORMAL HIGH (ref 6–20)
CO2: 24 mmol/L (ref 22–32)
Calcium: 9 mg/dL (ref 8.9–10.3)
Chloride: 99 mmol/L (ref 98–111)
Creatinine, Ser: 0.73 mg/dL (ref 0.61–1.24)
GFR, Estimated: >60 mL/min (ref >60)
Glucose, Bld: 191 mg/dL — ABNORMAL HIGH (ref 70–99)
Potassium: 4.3 mmol/L (ref 3.5–5.1)
Sodium: 133 mmol/L — ABNORMAL LOW (ref 135–145)

## 2023-11-08 LAB — PROTIME-INR
INR: 1.5 — ABNORMAL HIGH (ref 0.8–1.2)
Prothrombin Time: 18 s — ABNORMAL HIGH (ref 11.4–15.2)

## 2023-11-08 LAB — HEPARIN LEVEL (UNFRACTIONATED)
Heparin Unfractionated: 0.71 [IU]/mL — ABNORMAL HIGH (ref 0.30–0.70)
Heparin Unfractionated: <0.1 [IU]/mL — ABNORMAL LOW (ref 0.30–0.70)

## 2023-11-08 MED ORDER — KETOROLAC TROMETHAMINE 10 MG PO TABS
10.0000 mg | ORAL_TABLET | Freq: Four times a day (QID) | ORAL | Status: AC | PRN
  Administered 2023-11-08 – 2023-11-09 (×2): 10 mg via ORAL
  Filled 2023-11-08 (×3): qty 1

## 2023-11-08 MED ORDER — HYDROMORPHONE HCL 1 MG/ML IJ SOLN
1.0000 mg | INTRAMUSCULAR | Status: AC | PRN
  Administered 2023-11-08 – 2023-11-10 (×8): 1 mg via INTRAVENOUS
  Filled 2023-11-08 (×8): qty 1

## 2023-11-08 MED ORDER — WARFARIN SODIUM 2.5 MG PO TABS
2.5000 mg | ORAL_TABLET | Freq: Every day | ORAL | Status: DC
  Administered 2023-11-08 – 2023-11-11 (×4): 2.5 mg via ORAL
  Filled 2023-11-08 (×5): qty 1

## 2023-11-08 NOTE — Anesthesia Postprocedure Evaluation (Signed)
Anesthesia Post Note  Patient: Joel York  Procedure(s) Performed: VAD TUNNEL WOUND DEBRIDEMENT (Abdomen) WOUND VAC CHANGE     Patient location during evaluation: PACU Anesthesia Type: General Level of consciousness: awake and alert Pain management: pain level controlled Vital Signs Assessment: post-procedure vital signs reviewed and stable Respiratory status: spontaneous breathing, nonlabored ventilation, respiratory function stable and patient connected to nasal cannula oxygen Cardiovascular status: blood pressure returned to baseline and stable Postop Assessment: no apparent nausea or vomiting Anesthetic complications: no   No notable events documented.              Shelton Silvas

## 2023-11-08 NOTE — Plan of Care (Signed)
  Problem: Cardiac: Goal: LVAD will function as expected and patient will experience no clinical alarms Outcome: Progressing   Problem: Education: Goal: Knowledge of General Education information will improve Description: Including pain rating scale, medication(s)/side effects and non-pharmacologic comfort measures Outcome: Progressing   Problem: Clinical Measurements: Goal: Ability to maintain clinical measurements within normal limits will improve Outcome: Progressing Goal: Cardiovascular complication will be avoided Outcome: Progressing   Problem: Pain Management: Goal: General experience of comfort will improve Outcome: Progressing   Problem: Skin Integrity: Goal: Risk for impaired skin integrity will decrease Outcome: Progressing

## 2023-11-08 NOTE — Progress Notes (Signed)
Received IV consult to start PIV for Heparin drip, therefore Heparin levels can be drawn from left upper arm DL PICC. Patient refused to get PIV in right arm. He states," I had nerve pain from previous IV in that arm." Explained to patient that per protocol, no venipunctures should be performed in arm with PICC but patient still insisted to get PIV in left arm.I placed PIV 20g 1" in left distal posterior forearm for Heparin drip. Primary RN Aminatou notified.

## 2023-11-08 NOTE — Progress Notes (Signed)
.Patient ID: Joel York, male   DOB: August 19, 1986, 37 y.o.   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    11/06: CT C/A/P: small amount of periprosthetic fluid within the left pleural space adjacent to the proximal portion of the power cord.  11/7: Wound culture with S aureus and Enterobacter cloacae.  11/10: New PICC 11/12: Wound culture 11/1 MRSA corrected to MSSA. Dapto stopped. Continued IV cefepime. 11/15 OR debridement with wound vac change  Had repeat debridement in OR yesterday with wound vac change  Still c/o site pain   Remains on IV abx, heparin and warfarin   Getting po dilaudid 4mg  tid. Now back on IV dilaudid post-op per PVT   VAD Interrogation: Flow 4.3 L/min, Speed 5600 RPM, Power 4.0  Watts, PI 2.9    Objective:    Vital Signs:   Temp:  [97.8 F (36.6 C)-98.3 F (36.8 C)] 98 F (36.7 C) (11/16 1103) Pulse Rate:  [68-96] 92 (11/16 0348) Resp:  [11-27] 15 (11/16 1103) BP: (91-107)/(60-88) 93/73 (11/16 1103) SpO2:  [90 %-97 %] 97 % (11/16 1103) Weight:  [66 kg] 66 kg (11/16 0644) Last BM Date : 11/04/23 Mean arterial Pressure 80s  Intake/Output:   Intake/Output Summary (Last 24 hours) at 11/08/2023 1435 Last data filed at 11/08/2023 1107 Gross per 24 hour  Intake 240 ml  Output 1455 ml  Net -1215 ml    Physical Exam   General:  NAD.  HEENT: normal  Neck: supple. JVP not elevated.  Carotids 2+ bilat; no bruits. No lymphadenopathy or thryomegaly appreciated. Cor: LVAD hum.  Lungs: Clear. Abdomen: obese soft, + tender, non-distended. No hepatosplenomegaly. No bruits or masses. Good bowel sounds. Driveline site with wound vac  Anchor in place.  Extremities: no cyanosis, clubbing, rash. Warm no edema  Neuro: alert & oriented x 3. No focal deficits. Moves all 4 without problem     Telemetry   SR 80-90s (Personally reviewed)    Labs   Basic Metabolic Panel: Recent Labs  Lab 11/04/23 0334 11/05/23 0505 11/06/23 0530 11/07/23 0512 11/08/23 0354  NA 135 135 134* 134* 133*  K 3.4* 4.2 4.3 4.1 4.3  CL 113* 102 99 100 99  CO2 23 25 28 26 24   GLUCOSE 107* 122* 93 104* 191*  BUN 9 14 13  24* 25*  CREATININE 0.69 0.69 0.99 0.72 0.73  CALCIUM 7.7* 9.1 9.1 8.8* 9.0    Liver Function Tests: No results for input(s): "AST", "ALT", "ALKPHOS", "BILITOT", "PROT", "ALBUMIN" in the last 168 hours.  No results for input(s): "LIPASE", "AMYLASE" in the last 168 hours. No results for input(s): "AMMONIA" in the last 168 hours.  CBC: Recent Labs  Lab 11/03/23 0523 11/04/23 0334 11/05/23 0505 11/06/23 0530 11/07/23 0512  WBC 5.7 5.6 6.8 7.6 6.3  HGB 10.1* 10.2* 11.9* 12.7* 12.2*  HCT 32.3* 32.0* 36.9* 39.5 38.6*  MCV 86.6 87.2 86.6 84.9 84.5  PLT 247 228 282 291 273    INR: Recent Labs  Lab 11/04/23 0334 11/05/23 0505 11/06/23 0530 11/07/23 0512 11/08/23 0354  INR 1.5* 1.2 1.3* 1.5* 1.5*      Imaging   No results found.   Medications:     Scheduled Medications:  buprenorphine-naloxone  1 tablet Sublingual Daily   digoxin  0.125 mg Oral Daily   gabapentin  300 mg Oral TID   ivabradine  5 mg Oral BID WC   losartan  25 mg Oral BID   pantoprazole  40 mg Oral Daily   sertraline  25 mg Oral Daily   sodium chloride flush  10-40 mL Intracatheter Q12H   spironolactone  25 mg Oral Daily   warfarin  2.5 mg Oral q1600   Warfarin - Pharmacist Dosing Inpatient   Does not apply q1600   zinc sulfate (50mg  elemental zinc)  220 mg Oral Daily    Infusions:  ceFEPime (MAXIPIME) IV 2 g (11/08/23 1233)   heparin 500 Units/hr (11/07/23 1825)    PRN Medications: acetaminophen, HYDROmorphone (DILAUDID) injection, HYDROmorphone, hydrOXYzine, ketorolac, mouth rinse, sodium chloride flush, traZODone   Patient Profile   Joel York is a 37 yo male  with PMH of 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 Now presenting with driveline and PICC line infection.   Assessment/Plan:   1. 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 Went for I&D/wound vac change x3. Discharged home with 6 weeks of IV abx.  - Now re-admitted with persistent driveline infection.  - Wound culture from 11/1 grew MRSA. Corrected to MSSA on 11/12. Dapto stopped. - CT C/A/P 11/06 w/ small amount of periprosthetic fluid within the left pleural space adjacent to the proximal portion of the power cord.  - Wound culture from 11/7 with S. aureus and Enterobacter cloacae - s/p repeat debridement and wound vac change 11/15 - site looks good Continue IV abx - Scheduled for return to OR on Wednesday   2. PICC line infection - PICC pulled in clinic 10/27/23 - abx per above. ID following  - PICC replaced 11/02/23   3. 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 Echo EF <20%,  RV mildly reduced, - Etiology uncertain. ? 2/2 PVCs vs genetically mediated +/- hypertension.  - LHC: normal coronaries.  - cMRI:  LV markedly dilated EF 10% RV 17% NICM - Underwent HM-III VAD on 07/28/25. Much improved NYHA I-II  - INR 1.5, goal INR 1.5 -2 on warfarin/low dose heparin. Will continue heparin until INR 1.8 Discussed warfarin dosing with PharmD personally. - Continue digoxin 0.125 mg daily - Continue Losartan 25 mg BID (dose reduced 11/12 d/t soft MAP) - Continue spiro 25 mg daily - Continue Ivabradine 5 BID   4. 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 Hx drug abuse/Tobacco abuse - reports quit smoking 7/24, cotinine level at 30.1 09/22/23 - Now on subaxone with oral and IV dilaudid - Narcotic requirements/request far out of proportion to typical DL wounds. Suspect significant drug-seeking behavior  - Will need to wean  IV  Length of Stay: 40  [name], MD 11/08/2023, 2:35 PM  VAD Team --- VAD ISSUES ONLY--- Pager [phone] (7am - 7am)  Advanced Heart Failure Team  Pager [phone] (M-F; 7a - 5p)  Please contact [facility] for night-coverage after hours (5p -7a ) and weekends on [email or web address]

## 2023-11-08 NOTE — Progress Notes (Signed)
PHARMACY - ANTICOAGULATION CONSULT NOTE  Pharmacy Consult for heparin and warfarin Indication: LVAD  Allergies  Allergen Reactions   Chlorhexidine Dermatitis and Rash   Other Anaphylaxis    Tree Nuts   Peanuts [Peanut Oil] Anaphylaxis    Patient Measurements: Height: 5\' 7"  (170.2 cm) Weight: 66 kg (145 lb 8.1 oz) IBW/kg (Calculated) : 66.1 Heparin Dosing Weight: 64 kg  Vital Signs: Temp: 98 F (36.7 C) (11/16 0740) Temp Source: Oral (11/16 0740) BP: 98/77 (11/16 0740) Pulse Rate: 92 (11/16 0348)  Labs: Recent Labs    11/06/23 0530 11/06/23 0614 11/07/23 0512 11/08/23 0354 11/08/23 0915  HGB 12.7*  --  12.2*  --   --   HCT 39.5  --  38.6*  --   --   PLT 291  --  273  --   --   LABPROT 16.8*  --  18.1* 18.0*  --   INR 1.3*  --  1.5* 1.5*  --   HEPARINUNFRC  --    < > <0.10* 0.71* <0.10*  CREATININE 0.99  --  0.72 0.73  --    < > = values in this interval not displayed.    Estimated Creatinine Clearance: 119.2 mL/min (by C-G formula based on SCr of 0.73 mg/dL).   Medical History: Past Medical History:  Diagnosis Date   Acid reflux    ADHD (attention deficit hyperactivity disorder)    Asthma    Back pain    LVAD (left ventricular assist device) present (HCC)    Seizures (HCC)    Resolved     Assessment: 37 yo M with LVAD HMIII placed 07/29/23 c/b chronic driveline infection admitted for possible surgery 11/7. Patient had significant bleeding at driveline site on 09/30/23 requiring FFP. PTA Warfarin is on hold. Pharmacy consulted for heparin.   INR 1.5 Hgb 12 stable  plt 200s stable. LDH stable at 10-140 stable. No s/sx of bleeding. Discussed with Dr Donata Clay, while INR <1.8 will continue heparin infusion 500uts/hr low fixed rate - was running in PIV and labs from PICC - changed overnight hence heparin level elevated this am - drawn from PICC line with heparin - peripheral stick as expected heparin level < 0.1 -RN will move heparin drip to PIV so moving forward  labs can be drawn from PICC - also patient preference INR 1.5 at low end of goal until OR complete - VAC in place   Warfarin PTA: 3mg  MWF, 2mg  TTSS but INRs have been subtherapeutic.  Goal of Therapy:  Heparin level <0.3 units/ml - no titration unless directed by MD INR 2- 2.5 long term >> plan for 1.5-2 while undergoing debridements  Monitor platelets by anticoagulation protocol: Yes   Plan:  Continue heparin gtt at 500 units/hr Warfarin 2.5 mg daily Monitor daily heparin level, INR, CBC, and for s/sx of bleeding     Leota Sauers Pharm.D. CPP, BCPS Clinical Pharmacist [phone] 11/08/2023 10:10 AM   Please check AMION for all [facility] Pharmacy phone numbers After 10:00 PM, call Main Pharmacy [phone]

## 2023-11-09 DIAGNOSIS — T827XXA Infection and inflammatory reaction due to other cardiac and vascular devices, implants and grafts, initial encounter: Secondary | ICD-10-CM | POA: Diagnosis not present

## 2023-11-09 LAB — HEPARIN LEVEL (UNFRACTIONATED): Heparin Unfractionated: <0.1 [IU]/mL — ABNORMAL LOW (ref 0.30–0.70)

## 2023-11-09 LAB — PROTIME-INR
INR: 1.6 — ABNORMAL HIGH (ref 0.8–1.2)
Prothrombin Time: 19.6 s — ABNORMAL HIGH (ref 11.4–15.2)

## 2023-11-09 NOTE — Progress Notes (Signed)
PHARMACY - ANTICOAGULATION CONSULT NOTE  Pharmacy Consult for heparin and warfarin Indication: LVAD  Allergies  Allergen Reactions   Chlorhexidine Dermatitis and Rash   Other Anaphylaxis    Tree Nuts   Peanuts [Peanut Oil] Anaphylaxis    Patient Measurements: Height: 5\' 7"  (170.2 cm) Weight: 67.4 kg (148 lb 9.6 oz) IBW/kg (Calculated) : 66.1 Heparin Dosing Weight: 64 kg  Vital Signs: Temp: 98.7 F (37.1 C) (11/17 0823) Temp Source: Oral (11/17 0823) BP: 97/66 (11/17 0823) Pulse Rate: 100 (11/17 0309)  Labs: Recent Labs    11/07/23 0512 11/08/23 0354 11/08/23 0915 11/09/23 0524  HGB 12.2*  --   --   --   HCT 38.6*  --   --   --   PLT 273  --   --   --   LABPROT 18.1* 18.0*  --  19.6*  INR 1.5* 1.5*  --  1.6*  HEPARINUNFRC <0.10* 0.71* <0.10* <0.10*  CREATININE 0.72 0.73  --   --     Estimated Creatinine Clearance: 119.3 mL/min (by C-G formula based on SCr of 0.73 mg/dL).   Medical History: Past Medical History:  Diagnosis Date   Acid reflux    ADHD (attention deficit hyperactivity disorder)    Asthma    Back pain    LVAD (left ventricular assist device) present (HCC)    Seizures (HCC)    Resolved     Assessment: 37 yo M with LVAD HMIII placed 07/29/23 c/b chronic driveline infection admitted for possible surgery 11/7. Patient had significant bleeding at driveline site on 09/30/23 requiring FFP. PTA Warfarin is on hold. Pharmacy consulted for heparin.   INR 1.6 Hgb 12 stable  plt 200s stable. LDH stable at 100s stable. No s/sx of bleeding. Discussed with Dr Donata Clay, while INR <1.8 will continue heparin infusion 500uts/hr low fixed rate - was running in PIV and labs from PICC - changed overnight hence heparin level elevated this am - drawn from PICC line with heparin - peripheral stick as expected heparin level < 0.1 -RN will move heparin drip to PIV so moving forward labs can be drawn from PICC - also patient preference  Warfarin PTA: 3mg  MWF, 2mg  TTSS but  INRs have been subtherapeutic.  Goal of Therapy:  Heparin level <0.3 units/ml - no titration unless directed by MD INR 2- 2.5 long term >> plan for 1.5-2 while undergoing debridements  Monitor platelets by anticoagulation protocol: Yes   Plan:  Continue heparin gtt at 500 units/hr Warfarin 2.5 mg daily Monitor daily heparin level, INR, CBC, and for s/sx of bleeding     Leota Sauers Pharm.D. CPP, BCPS Clinical Pharmacist [phone] 11/09/2023 9:19 AM   Please check AMION for all [facility] Pharmacy phone numbers After 10:00 PM, call Main Pharmacy [phone]

## 2023-11-09 NOTE — Plan of Care (Signed)
  Problem: Education: Goal: Patient will be able to verbalize current INR target range and antiplatelet therapy for discharge home Outcome: Not Progressing   Problem: Cardiac: Goal: LVAD will function as expected and patient will experience no clinical alarms Outcome: Not Progressing   Problem: Education: Goal: Knowledge of General Education information will improve Description: Including pain rating scale, medication(s)/side effects and non-pharmacologic comfort measures Outcome: Not Progressing

## 2023-11-09 NOTE — Plan of Care (Signed)
  Problem: Cardiac: Goal: LVAD will function as expected and patient will experience no clinical alarms Outcome: Progressing   Problem: Clinical Measurements: Goal: Cardiovascular complication will be avoided Outcome: Progressing   Problem: Pain Management: Goal: General experience of comfort will improve Outcome: Progressing   Problem: Skin Integrity: Goal: Risk for impaired skin integrity will decrease Outcome: Progressing

## 2023-11-09 NOTE — Progress Notes (Signed)
.Patient ID: Joel York, male   DOB: 01/11/1986, 37 y.o.   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    11/06: CT C/A/P: small amount of periprosthetic fluid within the left pleural space adjacent to the proximal portion of the power cord.  11/7: Wound culture with S aureus and Enterobacter cloacae.  11/10: New PICC 11/12: Wound culture 11/1 MRSA corrected to MSSA. Dapto stopped. Continued IV cefepime. 11/15 OR debridement with wound vac change  Remains on IV abx. Wound vac had leak last night but resealed Working well now  Afebrile. C/o site pain and called last night for more IV pain meds.   On heparin. No bleeding   VAD Interrogation: Flow 4.2 L/min, Speed 5600 RPM, Power 3.5  Watts, PI 4.3   Objective:    Vital Signs:   Temp:  [97.8 F (36.6 C)-98.7 F (37.1 C)] 98.7 F (37.1 C) (11/17 0823) Pulse Rate:  [93-100] 100 (11/17 0309) Resp:  [14-32] 32 (11/17 0823) BP: (93-132)/(65-104) 97/66 (11/17 0823) SpO2:  [97 %-98 %] 98 % (11/17 0823) Weight:  [67.4 kg] 67.4 kg (11/17 0309) Last BM Date : 11/04/23 Mean arterial Pressure 70-80s  Intake/Output:   Intake/Output Summary (Last 24 hours) at 11/09/2023 0851 Last data filed at 11/08/2023 1935 Gross per 24 hour  Intake 240 ml  Output 800 ml  Net -560 ml    Physical Exam   General:  NAD.  HEENT: normal  Neck: supple. JVP not elevated.  Carotids 2+ bilat; no bruits. No lymphadenopathy or thryomegaly appreciated. Cor: LVAD hum.  Lungs: Clear. Abdomen:  soft, tender, non-distended. No hepatosplenomegaly. No bruits or masses. Good bowel sounds. Driveline site with wound vac in place. Anchor in place.  Extremities: no cyanosis, clubbing, rash. Warm no edema  Neuro: alert & oriented x 3. No focal deficits. Moves all 4 without problem   Telemetry   SR  90-100 (Personally reviewed)    Labs   Basic Metabolic Panel: Recent Labs  Lab 11/04/23 0334 11/05/23 0505 11/06/23 0530 11/07/23 0512 11/08/23 0354  NA 135 135 134* 134* 133*  K 3.4* 4.2 4.3 4.1 4.3  CL 113* 102 99 100 99  CO2 23 25 28 26 24   GLUCOSE 107* 122* 93 104* 191*  BUN 9 14 13  24* 25*  CREATININE 0.69 0.69 0.99 0.72 0.73  CALCIUM 7.7* 9.1 9.1 8.8* 9.0    Liver Function Tests: No results for input(s): "AST", "ALT", "ALKPHOS", "BILITOT", "PROT", "ALBUMIN" in the last 168 hours.  No results for input(s): "LIPASE", "AMYLASE" in the last 168 hours. No results for input(s): "AMMONIA" in the last 168 hours.  CBC: Recent Labs  Lab 11/03/23 0523 11/04/23 0334 11/05/23 0505 11/06/23 0530 11/07/23 0512  WBC 5.7 5.6 6.8 7.6 6.3  HGB 10.1* 10.2* 11.9* 12.7* 12.2*  HCT 32.3* 32.0* 36.9* 39.5 38.6*  MCV 86.6 87.2 86.6 84.9 84.5  PLT 247 228 282 291 273    INR: Recent Labs  Lab 11/05/23 0505 11/06/23 0530 11/07/23 0512 11/08/23 0354 11/09/23 0524  INR 1.2 1.3* 1.5* 1.5* 1.6*      Imaging   No results found.   Medications:     Scheduled Medications:  buprenorphine-naloxone  1 tablet Sublingual Daily   digoxin  0.125 mg Oral Daily   gabapentin  300 mg Oral TID  ivabradine  5 mg Oral BID WC   losartan  25 mg Oral BID   pantoprazole  40 mg Oral Daily   sertraline  25 mg Oral Daily   sodium chloride flush  10-40 mL Intracatheter Q12H   spironolactone  25 mg Oral Daily   warfarin  2.5 mg Oral q1600   Warfarin - Pharmacist Dosing Inpatient   Does not apply q1600   zinc sulfate (50mg  elemental zinc)  220 mg Oral Daily    Infusions:  ceFEPime (MAXIPIME) IV 2 g (11/09/23 0531)   heparin 500 Units/hr (11/07/23 1825)    PRN Medications: acetaminophen, HYDROmorphone (DILAUDID) injection, HYDROmorphone, hydrOXYzine, ketorolac, mouth rinse, sodium chloride flush, traZODone   Patient Profile   Joel York is a 37 yo male with PMH of chronic  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 Now presenting with driveline and PICC line infection.   Assessment/Plan:   1. 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 Went for I&D/wound vac change x3. Discharged home with 6 weeks of IV abx.  - Now re-admitted with persistent driveline infection.  - Wound culture from 11/1 grew MRSA. Corrected to MSSA on 11/12. Dapto stopped. - CT C/A/P 11/06 w/ small amount of periprosthetic fluid within the left pleural space adjacent to the proximal portion of the power cord.  - Wound culture from 11/7 with S. aureus and Enterobacter cloacae - s/p repeat debridement and wound vac change 11/15 - site OK. Continue IV abx  - Scheduled for return to OR on Wednesday   2. PICC line infection - PICC pulled in clinic 10/27/23 - PICC replaced 11/02/23 - site ok   3. 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 Echo EF <20%,  RV mildly reduced, - Etiology uncertain. ? 2/2 PVCs vs genetically mediated +/- hypertension.  - LHC: normal coronaries.  - cMRI:  LV markedly dilated EF 10% RV 17% NICM - Underwent HM-III VAD on 07/28/25. Much improved NYHA I-II  - INR 1.6, goal INR 1.5 -2 on warfarin/low dose heparin. Will continue heparin until INR 1.8 Discussed warfarin dosing with PharmD personally. - Continue digoxin 0.125 mg daily - Continue Losartan 25 mg BID (dose reduced 11/12 d/t soft MAP) - Continue spiro 25 mg daily - Continue Ivabradine 5 BID   4. 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 Hx drug abuse/Tobacco abuse - reports quit smoking 7/24, cotinine level at 30.1 09/22/23 - Now on subaxone with oral and IV dilaudid - Narcotic requirements/request far out of proportion to typical DL wounds. Suspect significant drug-seeking behavior  - Will need to wean IV discussed with Dr. Donata Clay and VAD  coordinator  Length of Stay: 12  [name], MD 11/09/2023, 8:51 AM  VAD Team --- VAD ISSUES ONLY--- Pager [phone] (7am - 7am)  Advanced Heart Failure Team  Pager [phone] (M-F; 7a - 5p)  Please contact [facility] for night-coverage after hours (5p -7a ) and weekends on [email or web address]

## 2023-11-10 DIAGNOSIS — I5022 Chronic systolic (congestive) heart failure: Secondary | ICD-10-CM

## 2023-11-10 DIAGNOSIS — I5082 Biventricular heart failure: Secondary | ICD-10-CM

## 2023-11-10 DIAGNOSIS — B9561 Methicillin susceptible Staphylococcus aureus infection as the cause of diseases classified elsewhere: Secondary | ICD-10-CM

## 2023-11-10 DIAGNOSIS — T827XXA Infection and inflammatory reaction due to other cardiac and vascular devices, implants and grafts, initial encounter: Secondary | ICD-10-CM | POA: Diagnosis not present

## 2023-11-10 LAB — CBC
HCT: 37.7 % — ABNORMAL LOW (ref 39.0–52.0)
Hemoglobin: 11.9 g/dL — ABNORMAL LOW (ref 13.0–17.0)
MCH: 27.5 pg (ref 26.0–34.0)
MCHC: 31.6 g/dL (ref 30.0–36.0)
MCV: 87.1 fL (ref 80.0–100.0)
Platelets: 301 10*3/uL (ref 150–400)
RBC: 4.33 MIL/uL (ref 4.22–5.81)
RDW: 14.1 % (ref 11.5–15.5)
WBC: 7.9 10*3/uL (ref 4.0–10.5)
nRBC: 0 % (ref 0.0–0.2)

## 2023-11-10 LAB — BASIC METABOLIC PANEL
Anion gap: 8 (ref 5–15)
BUN: 15 mg/dL (ref 6–20)
CO2: 26 mmol/L (ref 22–32)
Calcium: 9 mg/dL (ref 8.9–10.3)
Chloride: 104 mmol/L (ref 98–111)
Creatinine, Ser: 0.88 mg/dL (ref 0.61–1.24)
GFR, Estimated: >60 mL/min (ref >60)
Glucose, Bld: 110 mg/dL — ABNORMAL HIGH (ref 70–99)
Potassium: 3.8 mmol/L (ref 3.5–5.1)
Sodium: 138 mmol/L (ref 135–145)

## 2023-11-10 LAB — HEPARIN LEVEL (UNFRACTIONATED): Heparin Unfractionated: <0.1 [IU]/mL — ABNORMAL LOW (ref 0.30–0.70)

## 2023-11-10 LAB — PROTIME-INR
INR: 1.7 — ABNORMAL HIGH (ref 0.8–1.2)
Prothrombin Time: 20.2 s — ABNORMAL HIGH (ref 11.4–15.2)

## 2023-11-10 LAB — GLUCOSE, CAPILLARY: Glucose-Capillary: 134 mg/dL — ABNORMAL HIGH (ref 70–99)

## 2023-11-10 LAB — LACTATE DEHYDROGENASE: LDH: 160 U/L (ref 98–192)

## 2023-11-10 MED ORDER — HYDROMORPHONE HCL 1 MG/ML IJ SOLN
1.0000 mg | INTRAMUSCULAR | Status: AC | PRN
  Administered 2023-11-10 – 2023-11-11 (×6): 1 mg via INTRAVENOUS
  Administered 2023-11-12: .5 mg via INTRAVENOUS
  Administered 2023-11-12: 1 mg via INTRAVENOUS
  Administered 2023-11-12: .5 mg via INTRAVENOUS
  Filled 2023-11-10 (×7): qty 1

## 2023-11-10 NOTE — Progress Notes (Signed)
PHARMACY - ANTICOAGULATION CONSULT NOTE  Pharmacy Consult for heparin and warfarin Indication: LVAD  Allergies  Allergen Reactions   Chlorhexidine Dermatitis and Rash   Other Anaphylaxis    Tree Nuts   Peanuts [Peanut Oil] Anaphylaxis    Patient Measurements: Height: 5\' 7"  (170.2 cm) Weight: 66.9 kg (147 lb 7.8 oz) IBW/kg (Calculated) : 66.1 Heparin Dosing Weight: 64 kg  Vital Signs: Temp: 97.6 F (36.4 C) (11/18 1118) Temp Source: Oral (11/18 1118) BP: 98/67 (11/18 1118) Pulse Rate: 76 (11/18 1118)  Labs: Recent Labs    11/08/23 0354 11/08/23 0915 11/09/23 0524 11/10/23 0800  HGB  --   --   --  11.9*  HCT  --   --   --  37.7*  PLT  --   --   --  301  LABPROT 18.0*  --  19.6* 20.2*  INR 1.5*  --  1.6* 1.7*  HEPARINUNFRC 0.71* <0.10* <0.10* <0.10*  CREATININE 0.73  --   --  0.88    Estimated Creatinine Clearance: 108.5 mL/min (by C-G formula based on SCr of 0.88 mg/dL).   Medical History: Past Medical History:  Diagnosis Date   Acid reflux    ADHD (attention deficit hyperactivity disorder)    Asthma    Back pain    LVAD (left ventricular assist device) present (HCC)    Seizures (HCC)    Resolved     Assessment: 38 yo M with LVAD HMIII placed 07/29/23 c/b chronic driveline infection admitted for possible surgery 11/7. Patient had significant bleeding at driveline site on 09/30/23 requiring FFP. Pharmacy consulted for heparin and warfarin.   INR 1.7 Hgb 11.9 stable  platelet count stable. LDH stable at 100s stable. No s/sx of bleeding.   Heparin level undetectable as expected on low, fixed dosage.  -Heparin drip running in PIV so moving forward labs can be drawn from PICC - also patient preference  Warfarin PTA: 3mg  MWF, 2mg  TTSS but INRs have been subtherapeutic outpatient.  Goal of Therapy:  Heparin level <0.3 units/ml - no titration unless directed by MD INR 2- 2.5 long term >> plan for 1.5-2 while undergoing debridements  Monitor platelets by  anticoagulation protocol: Yes   Plan:  Continue heparin gtt at 500 units/hr Warfarin 2.5 mg daily Monitor daily heparin level, INR, CBC, and for s/sx of bleeding   Jenetta Downer, [facility] Clinical Pharmacist  11/10/2023 2:41 PM   [facility] pharmacy phone numbers are listed on [email or web address]

## 2023-11-10 NOTE — Progress Notes (Signed)
LVAD Coordinator Rounding Note:  Admitted 10/28/23 to heart failure service due to worsening driveline infection. Pt seen in VAD clinic 11/4- wound looked progressively worse. Decision was made to admit for further IV antibiotics and debridement.   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  Pt laying in bed. Continued pain at wound vac site. Wound VAC alarming blockage when I entered the room. Changed bell and added sterile saline to sponge. Alarms resolved.    Rash scabbed over on arms, neck, and back. Rash still present on abdomen but pt states this is less painful/itchy.   ID consulted. Cultures positive for Staph Aureus and Enterobacter Cloacae as of 10/30/23. Currently receiving Cefepime 2g q8h.  Pt had wound debridement and wound vac change in the OR this past Friday. Will plan to take pt back to OR on Wednesday this week to remove wound VAC.  Vital signs: Temp: 97.6 HR: 76 Doppler Pressure: 84 Auto BP: 98/67 (78) O2 Sat: 97% on RA Wt: 141.1>141.5>145.9>150.2>154>152.6 >151>148.5>147.3>144.4>143.7>147.4lbs    LVAD interrogation reveals:  Speed: 5600 Flow: 3.9 Power: 4.2 w PI: 3.9  Alarms: 2 LF yesterday around 1500-pt tells me that he didn't hear these Events: 40 PI events today; 55 yesterday Hematocrit: 38  Fixed speed: 5600 Low speed limit: 5300  Drive Line: Wound vac dressing clean, dry, intact. Negative pressure -125. Anchor secure. Plan for wound vac change in OR Wednesday 11/20 with Dr Donata Clay.   Labs:  LDH trend: 133>134>124>146>145>132>160  INR trend: 2.4>1.7>1.3>1.5>1.4>1.5>1.2>1.3>1.5>1.7   WBC trend: 6.2>5.6>7.6>7.2>8.0>5.7>5.6>6.8>7.6>6.3>7.9  Anticoagulation Plan: -INR Goal: 2-2.5 -ASA Dose: off  Blood Products:  10/29/23>> 2 FFP  Infection:  10/24/23>>wound cx>>STAPH AUREUS; final 10/29/23>>blood cultures>> no growth 5 days 10/30/23>> wound cx>>RARE STAPHYLOCOCCUS AUREUS  RARE ENTEROBACTER CLOACAE; final  11/07/23>>driveline  culture OR>>RARE ENTEROBACTER CLOACAE  RARE STAPHYLOCOCCUS AUREUS  RARE STENOTROPHOMONAS MALTOPHILIA   Drips:  Heparin 500 units/hr  Adverse Events on VAD: - Admitted 09/29/23 with DL infection. Wound cx + staph aureus. Underwent I&D/wound vac change x3. ID saw, discharged home on 6 weeks of IV cefazolin (end date 11/18/23).   Plan/Recommendations:  1. Please page VAD coordinator for any alarms or VAD equipment issues. 2. Page VAD coordinator for wound vac issues 3. VAD coordinator will accompany pt to OR Wednesday 11/20  Carlton Adam RN,BSN VAD Coordinator  Office: [phone]  24/7 Pager: [phone]

## 2023-11-10 NOTE — Progress Notes (Signed)
.Patient ID: Joel York, male   DOB: 12-20-1986, 37 y.o.   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    11/06: CT C/A/P: small amount of periprosthetic fluid within the left pleural space adjacent to the proximal portion of the power cord.  11/7: Wound culture with S aureus and Enterobacter cloacae.  11/10: New PICC 11/12: Wound culture 11/1 MRSA corrected to MSSA. Dapto stopped. Continued IV cefepime. 11/15 OR debridement with wound vac change  Remains on IV abx. Afebrile.   Pain improving. Has been ambulating.   VAD Interrogation: Flow 3.9 L/min, Speed 5600 RPM, Power 4.2 watts, PI 3.5  x2 PI events  Objective:    Vital Signs:   Temp:  [97.6 F (36.4 C)-98.7 F (37.1 C)] 97.6 F (36.4 C) (11/18 0308) Resp:  [10-32] 15 (11/18 0308) BP: (95-109)/(57-86) 96/61 (11/18 0308) SpO2:  [97 %-99 %] 97 % (11/18 0308) Weight:  [66.9 kg] 66.9 kg (11/18 0553) Last BM Date : 11/09/23 Mean arterial Pressure 80s  Intake/Output:   Intake/Output Summary (Last 24 hours) at 11/10/2023 0707 Last data filed at 11/10/2023 0500 Gross per 24 hour  Intake 240 ml  Output 625 ml  Net -385 ml    Physical Exam  General:  Well appearing. No resp difficulty HEENT: Normal Neck: supple. JVP ~7. Carotids 2+ bilat; no bruits. No lymphadenopathy or thyromegaly appreciated. Cor: Mechanical heart sounds with LVAD hum present. Lungs: Clear Abdomen: soft, nontender, nondistended. No hepatosplenomegaly. No bruits or masses. Good bowel sounds. Driveline: C/D/I; securement device intact. Wound vac with good seal around DL site  Extremities: no cyanosis, clubbing, rash, edema. PICC LUE Neuro: alert & orientedx3, cranial nerves grossly intact. moves all 4 extremities w/o difficulty. Affect pleasant   Telemetry   SR 70s (Personally reviewed)     Labs   Basic Metabolic Panel: Recent Labs  Lab 11/04/23 0334 11/05/23 0505 11/06/23 0530 11/07/23 0512 11/08/23 0354  NA 135 135 134* 134* 133*  K 3.4* 4.2 4.3 4.1 4.3  CL 113* 102 99 100 99  CO2 23 25 28 26 24   GLUCOSE 107* 122* 93 104* 191*  BUN 9 14 13  24* 25*  CREATININE 0.69 0.69 0.99 0.72 0.73  CALCIUM 7.7* 9.1 9.1 8.8* 9.0    Liver Function Tests: No results for input(s): "AST", "ALT", "ALKPHOS", "BILITOT", "PROT", "ALBUMIN" in the last 168 hours.  No results for input(s): "LIPASE", "AMYLASE" in the last 168 hours. No results for input(s): "AMMONIA" in the last 168 hours.  CBC: Recent Labs  Lab 11/04/23 0334 11/05/23 0505 11/06/23 0530 11/07/23 0512  WBC 5.6 6.8 7.6 6.3  HGB 10.2* 11.9* 12.7* 12.2*  HCT 32.0* 36.9* 39.5 38.6*  MCV 87.2 86.6 84.9 84.5  PLT 228 282 291 273    INR: Recent Labs  Lab 11/05/23 0505 11/06/23 0530 11/07/23 0512 11/08/23 0354 11/09/23 0524  INR 1.2 1.3* 1.5* 1.5* 1.6*      Imaging   No results found.   Medications:     Scheduled Medications:  buprenorphine-naloxone  1 tablet Sublingual Daily   digoxin  0.125 mg Oral Daily   gabapentin  300 mg Oral TID   ivabradine  5 mg Oral BID WC   losartan  25 mg Oral BID   pantoprazole  40 mg Oral Daily   sertraline  25 mg  Oral Daily   sodium chloride flush  10-40 mL Intracatheter Q12H   spironolactone  25 mg Oral Daily   warfarin  2.5 mg Oral q1600   Warfarin - Pharmacist Dosing Inpatient   Does not apply q1600   zinc sulfate (50mg  elemental zinc)  220 mg Oral Daily    Infusions:  ceFEPime (MAXIPIME) IV 2 g (11/10/23 0636)   heparin 500 Units/hr (11/09/23 2121)    PRN Medications: acetaminophen, HYDROmorphone (DILAUDID) injection, HYDROmorphone, hydrOXYzine, ketorolac, mouth rinse, sodium chloride flush, traZODone   Patient Profile   Joel York is a 37 yo male with PMH of 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 Now presenting with driveline and PICC line infection.   Assessment/Plan:   1. 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 Went for I&D/wound vac change x3. Discharged home with 6 weeks of IV abx.  - Now re-admitted with persistent driveline infection.  - Wound culture from 11/1 grew MRSA. Corrected to MSSA on 11/12. Dapto stopped. - CT C/A/P 11/06 w/ small amount of periprosthetic fluid within the left pleural space adjacent to the proximal portion of the power cord.  - Wound culture from 11/7 with S. aureus and Enterobacter cloacae - s/p repeat debridement and wound vac change 11/15 - site OK. Continue IV abx  - Scheduled for return to OR on Wednesday   2. PICC line infection - PICC pulled in clinic 10/27/23 - PICC replaced 11/02/23 - site ok   3. 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 Echo EF <20%,  RV mildly reduced, - Etiology uncertain. ? 2/2 PVCs vs genetically mediated +/- hypertension.  - LHC: normal coronaries.  - cMRI:  LV markedly dilated EF 10% RV 17% NICM - Underwent HM-III VAD on 07/28/25. Much improved NYHA I-II  - INR 1.6, goal INR 1.5 -2 on warfarin/low dose heparin. Will continue heparin until INR 1.8 Discussed warfarin dosing with PharmD personally. - Continue digoxin 0.125 mg daily - Continue Losartan 25 mg BID (dose reduced 11/12 d/t soft MAP) - Continue spiro 25 mg daily - Continue Ivabradine 5 BID   4. 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 Hx drug abuse/Tobacco abuse - reports quit smoking 7/24, cotinine level at 30.1 09/22/23 - Now on subaxone with oral and IV dilaudid - Narcotic requirements/request far out of proportion to typical DL wounds. Suspect significant drug-seeking behavior  - Will need to wean IV discussed with Dr. Donata Clay and VAD coordinator  Discussed with RN, will collect labs this morning.   Length of  Stay: 13  Alen Bleacher, NP 11/10/2023, 7:07 AM  VAD Team --- VAD ISSUES ONLY--- Pager [phone] (7am - 7am)  Advanced Heart Failure Team  Pager [phone] (M-F; 7a - 5p)  Please contact [facility] for night-coverage after hours (5p -7a ) and weekends on [email or web address]

## 2023-11-10 NOTE — Progress Notes (Addendum)
3 Days Post-Op Procedure(s) (LRB): VAD TUNNEL WOUND DEBRIDEMENT (N/A) WOUND VAC CHANGE (N/A) Subjective: Patient with decreasing surgical pain Minimal output in wound VAC canister Operative cultures from 3 days ago showing persistent MSSA as well as gram-negative organism including Enterobacter.  Will need to change to cefepime. INR 1.6 also receiving low-dose heparin Objective: Vital signs in last 24 hours: Temp:  [97.6 F (36.4 C)-97.8 F (36.6 C)] 97.6 F (36.4 C) (11/18 1527) Pulse Rate:  [71-80] 80 (11/18 1527) Cardiac Rhythm: Normal sinus rhythm (11/18 0800) Resp:  [8-19] 8 (11/18 1416) BP: (85-112)/(57-91) 112/91 (11/18 1527) SpO2:  [97 %-99 %] 97 % (11/18 0308) Weight:  [66.9 kg] 66.9 kg (11/18 0553)  Hemodynamic parameters for last 24 hours:  Sinus rhythm afebrile  Intake/Output from previous day: 11/17 0701 - 11/18 0700 In: 240 [P.O.:240] Out: 625 [Urine:625] Intake/Output this shift: Total I/O In: 447.4 [I.V.:347.4; IV Piggyback:100] Out: 500 [Urine:500]       Exam    General- alert and comfortable    Neck- no JVD, no cervical adenopathy palpable, no carotid bruit   Lungs- clear without rales, wheezes   Cor- regular rate and rhythm, normal VAD hum   Abdomen- soft, non-tender.  Wound VAC sponge compressed.   Extremities - warm, non-tender, minimal edema   Neuro- oriented, appropriate, no focal weakness   Lab Results: Recent Labs    11/10/23 0800  WBC 7.9  HGB 11.9*  HCT 37.7*  PLT 301   BMET:  Recent Labs    11/08/23 0354 11/10/23 0800  NA 133* 138  K 4.3 3.8  CL 99 104  CO2 24 26  GLUCOSE 191* 110*  BUN 25* 15  CREATININE 0.73 0.88  CALCIUM 9.0 9.0    PT/INR:  Recent Labs    11/10/23 0800  LABPROT 20.2*  INR 1.7*   ABG    Component Value Date/Time   PHART 7.510 (H) 08/03/2023 0445   HCO3 27.3 08/03/2023 0445   TCO2 28 08/03/2023 0445   ACIDBASEDEF 2.0 07/31/2023 1704   O2SAT 68.1 08/08/2023 0535   CBG (last 3)  No results  for input(s): "GLUCAP" in the last 72 hours.  Assessment/Plan: S/P Procedure(s) (LRB): VAD TUNNEL WOUND DEBRIDEMENT (N/A) WOUND VAC CHANGE (N/A) Start cefepime and DC Ancef for better coverage of wound cultures. Continue wound VAC suction therapy. Plan return to the OR for removal of wound VAC system under monitored IV sedation.   LOS: 13 days    Lovett Sox 11/10/2023

## 2023-11-10 NOTE — Plan of Care (Signed)
  Problem: Education: Goal: Patient will understand all VAD equipment and how it functions Outcome: Progressing Goal: Patient will be able to verbalize current INR target range and antiplatelet therapy for discharge home Outcome: Progressing   Problem: Cardiac: Goal: LVAD will function as expected and patient will experience no clinical alarms Outcome: Progressing   Problem: Education: Goal: Knowledge of General Education information will improve Description: Including pain rating scale, medication(s)/side effects and non-pharmacologic comfort measures Outcome: Progressing   Problem: Health Behavior/Discharge Planning: Goal: Ability to manage health-related needs will improve Outcome: Progressing   Problem: Clinical Measurements: Goal: Ability to maintain clinical measurements within normal limits will improve Outcome: Progressing Goal: Will remain free from infection Outcome: Progressing Goal: Diagnostic test results will improve Outcome: Progressing Goal: Respiratory complications will improve Outcome: Progressing Goal: Cardiovascular complication will be avoided Outcome: Progressing

## 2023-11-10 NOTE — Progress Notes (Signed)
[facility]  Date of Admission:  10/28/2023   Total days of inpatient antibiotics 13  Principal Problem:   Infection associated with driveline of left ventricular assist device (LVAD) (HCC)          Assessment: 37 YM admitted with:  #Persistent driveline infection - On 11/1 wound culture grew MSSA - 11/6 CT no associated interval fluid collection, mild splenomegaly - 11/I&D and wound VAC placement or cultures growing Enterobacter and MSSA - Repeat OR on 11/15 growing Enterobacter and staph aureus of bilateral.  Wound was irrigated, wound VAC was exchanged.  Recommendations: -Continue cefepime - Follow OR cultures, growing Enterobacter cloacae and Staph aureus so far   Microbiology:   Antibiotics: Cefepime 11/5 - 11/6 Daptomycin 11/6 - 11/10 Cefepime 11/11-present  SUBJECTIVE: In bed.  Family at bedside. Interval: Afebrile overnight, wbc 7.9k  Review of Systems: Review of Systems  All other systems reviewed and are negative.    Scheduled Meds:  buprenorphine-naloxone  1 tablet Sublingual Daily   digoxin  0.125 mg Oral Daily   gabapentin  300 mg Oral TID   ivabradine  5 mg Oral BID WC   losartan  25 mg Oral BID   pantoprazole  40 mg Oral Daily   sertraline  25 mg Oral Daily   sodium chloride flush  10-40 mL Intracatheter Q12H   spironolactone  25 mg Oral Daily   warfarin  2.5 mg Oral q1600   Warfarin - Pharmacist Dosing Inpatient   Does not apply q1600   zinc sulfate (50mg  elemental zinc)  220 mg Oral Daily   Continuous Infusions:  ceFEPime (MAXIPIME) IV 2 g (11/10/23 0636)   heparin 500 Units/hr (11/09/23 2121)   PRN Meds:.acetaminophen, HYDROmorphone (DILAUDID) injection, HYDROmorphone, hydrOXYzine, mouth rinse, sodium chloride flush, traZODone Allergies  Allergen Reactions   Chlorhexidine Dermatitis and Rash   Other Anaphylaxis    Tree Nuts   Peanuts [Peanut Oil] Anaphylaxis    OBJECTIVE: Vitals:   11/10/23  0700 11/10/23 0737 11/10/23 0800 11/10/23 1118  BP:  (!) 85/67  98/67  Pulse:  71  76  Resp: 10  (!) 8   Temp:  97.6 F (36.4 C)  97.6 F (36.4 C)  TempSrc:  Oral  Oral  SpO2:      Weight:      Height:       Body mass index is 23.1 kg/m.  Physical Exam Constitutional:      General: He is not in acute distress.    Appearance: He is normal weight. He is not toxic-appearing.  HENT:     Head: Normocephalic and atraumatic.     Right Ear: External ear normal.     Left Ear: External ear normal.     Nose: No congestion or rhinorrhea.     Mouth/Throat:     Mouth: Mucous membranes are moist.     Pharynx: Oropharynx is clear.  Eyes:     Extraocular Movements: Extraocular movements intact.     Conjunctiva/sclera: Conjunctivae normal.     Pupils: Pupils are equal, round, and reactive to light.  Cardiovascular:     Rate and Rhythm: Normal rate and regular rhythm.     Heart sounds: No murmur heard.    No friction rub. No gallop.  Pulmonary:     Effort: Pulmonary effort is normal.     Breath sounds: Normal breath sounds.  Abdominal:     General: Abdomen is flat. Bowel  sounds are normal.     Palpations: Abdomen is soft.     Comments: Wound VAC in place.  Musculoskeletal:        General: No swelling.     Cervical back: Normal range of motion and neck supple.  Skin:    General: Skin is warm and dry.  Neurological:     General: No focal deficit present.     Mental Status: He is oriented to person, place, and time.  Psychiatric:        Mood and Affect: Mood normal.       Lab Results Lab Results  Component Value Date   WBC 7.9 11/10/2023   HGB 11.9 (L) 11/10/2023   HCT 37.7 (L) 11/10/2023   MCV 87.1 11/10/2023   PLT 301 11/10/2023    Lab Results  Component Value Date   CREATININE 0.88 11/10/2023   BUN 15 11/10/2023   NA 138 11/10/2023   K 3.8 11/10/2023   CL 104 11/10/2023   CO2 26 11/10/2023    Lab Results  Component Value Date   ALT 8 10/28/2023   AST 16  10/28/2023   ALKPHOS 150 (H) 10/28/2023   BILITOT 0.5 10/28/2023        [name], MD [facility] 11/10/2023, 11:56 AM I have personally spent 52 minutes involved in face-to-face and non-face-to-face activities for this patient on the day of the visit. Professional time spent includes the following activities: Preparing to see the patient (review of tests), Obtaining and/or reviewing separately obtained history (admission/discharge record), Performing a medically appropriate examination and/or evaluation , Ordering medications/tests/procedures, referring and communicating with other health care professionals, Documenting clinical information in the EMR, Independently interpreting results (not separately reported), Communicating results to the patient/family/caregiver, Counseling and educating the patient/family/caregiver and Care coordination (not separately reported).

## 2023-11-10 NOTE — TOC Progression Note (Signed)
Transition of Care [facility]) - Progression Note    Patient Details  Name: Joel York MRN: [id number] Date of Birth: 1986-04-07  [facility] CM/SW Contact  Nicanor Bake Phone Number: [phone] 11/10/2023, 11:31 AM  Clinical Narrative:   HF CSW met with pt, wife, and minor child at bedside. Pt appeared to be in a antsy mood. Pt stated that he was ready to go home and plans to leave by Thursday. Nurse was in the room and explained that we needed to see how the pts next procedure goes. Pt appeared to be adamant about wanting to leave. CSW encouraged the pt to be patient and wait for medical recommendations.   TOC will continue following.     Expected Discharge Plan: Home w Home Health Services Barriers to Discharge: Continued Medical Work up  Expected Discharge Plan and Services   Discharge Planning Services: CM Consult Post Acute Care Choice: Home Health Living arrangements for the past 2 months: Single Family Home                           HH Arranged: RN HH Agency: [facility] Date HH Agency Contacted: 11/05/23 Time HH Agency Contacted: 1058 Representative spoke with at [facility] Agency: [facility], Pam RN   Social Determinants of Health (SDOH) Interventions SDOH Screenings   Food Insecurity: No Food Insecurity (10/28/2023)  Housing: Low Risk  (10/28/2023)  Transportation Needs: No Transportation Needs (10/28/2023)  Utilities: Not At Risk (10/28/2023)  Alcohol Screen: Low Risk  (07/14/2023)  Financial Resource Strain: Medium Risk (08/12/2023)  Physical Activity: Sufficiently Active (07/14/2023)  Stress: Stress Concern Present (07/14/2023)  Tobacco Use: High Risk (11/07/2023)    Readmission Risk Interventions     No data to display

## 2023-11-11 ENCOUNTER — Encounter (HOSPITAL_COMMUNITY): Payer: Self-pay | Admitting: Cardiothoracic Surgery

## 2023-11-11 ENCOUNTER — Encounter (HOSPITAL_COMMUNITY): Payer: Medicaid Other | Admitting: Internal Medicine

## 2023-11-11 DIAGNOSIS — T827XXA Infection and inflammatory reaction due to other cardiac and vascular devices, implants and grafts, initial encounter: Secondary | ICD-10-CM | POA: Diagnosis not present

## 2023-11-11 DIAGNOSIS — B9561 Methicillin susceptible Staphylococcus aureus infection as the cause of diseases classified elsewhere: Secondary | ICD-10-CM | POA: Diagnosis not present

## 2023-11-11 LAB — CBC
HCT: 37.3 % — ABNORMAL LOW (ref 39.0–52.0)
Hemoglobin: 11.8 g/dL — ABNORMAL LOW (ref 13.0–17.0)
MCH: 27.3 pg (ref 26.0–34.0)
MCHC: 31.6 g/dL (ref 30.0–36.0)
MCV: 86.1 fL (ref 80.0–100.0)
Platelets: 270 10*3/uL (ref 150–400)
RBC: 4.33 MIL/uL (ref 4.22–5.81)
RDW: 13.6 % (ref 11.5–15.5)
WBC: 6.4 10*3/uL (ref 4.0–10.5)
nRBC: 0 % (ref 0.0–0.2)

## 2023-11-11 LAB — PROTIME-INR
INR: 1.7 — ABNORMAL HIGH (ref 0.8–1.2)
Prothrombin Time: 20.3 s — ABNORMAL HIGH (ref 11.4–15.2)

## 2023-11-11 LAB — HEPARIN LEVEL (UNFRACTIONATED): Heparin Unfractionated: <0.1 [IU]/mL — ABNORMAL LOW (ref 0.30–0.70)

## 2023-11-11 LAB — BASIC METABOLIC PANEL
Anion gap: 9 (ref 5–15)
BUN: 12 mg/dL (ref 6–20)
CO2: 27 mmol/L (ref 22–32)
Calcium: 8.9 mg/dL (ref 8.9–10.3)
Chloride: 101 mmol/L (ref 98–111)
Creatinine, Ser: 0.79 mg/dL (ref 0.61–1.24)
GFR, Estimated: >60 mL/min (ref >60)
Glucose, Bld: 99 mg/dL (ref 70–99)
Potassium: 3.8 mmol/L (ref 3.5–5.1)
Sodium: 137 mmol/L (ref 135–145)

## 2023-11-11 MED ORDER — LEVOFLOXACIN 750 MG PO TABS
750.0000 mg | ORAL_TABLET | Freq: Every day | ORAL | Status: DC
  Administered 2023-11-11 – 2023-11-13 (×3): 750 mg via ORAL
  Filled 2023-11-11 (×3): qty 1

## 2023-11-11 MED ORDER — MINOCYCLINE HCL 50 MG PO CAPS
200.0000 mg | ORAL_CAPSULE | Freq: Two times a day (BID) | ORAL | Status: DC
  Administered 2023-11-11 – 2023-11-13 (×5): 200 mg via ORAL
  Filled 2023-11-11 (×5): qty 4

## 2023-11-11 MED ORDER — SODIUM CHLORIDE 0.9 % IV SOLN
12.0000 g | INTRAVENOUS | Status: DC
  Administered 2023-11-11 – 2023-11-12 (×2): 12 g via INTRAVENOUS
  Filled 2023-11-11 (×3): qty 48

## 2023-11-11 NOTE — Progress Notes (Signed)
PHARMACY - ANTICOAGULATION CONSULT NOTE  Pharmacy Consult for heparin and warfarin Indication: LVAD  Allergies  Allergen Reactions   Chlorhexidine Dermatitis and Rash   Other Anaphylaxis    Tree Nuts   Peanuts [Peanut Oil] Anaphylaxis    Patient Measurements: Height: 5\' 7"  (170.2 cm) Weight: 66.9 kg (147 lb 7.8 oz) (Scale A) IBW/kg (Calculated) : 66.1 Heparin Dosing Weight: 64 kg  Vital Signs: Temp: 98.2 F (36.8 C) (11/19 0900) Temp Source: Oral (11/19 0434) BP: 96/64 (11/19 0809) Pulse Rate: 80 (11/19 0827)  Labs: Recent Labs    11/09/23 0524 11/10/23 0800 11/11/23 0500  HGB  --  11.9* 11.8*  HCT  --  37.7* 37.3*  PLT  --  301 270  LABPROT 19.6* 20.2* 20.3*  INR 1.6* 1.7* 1.7*  HEPARINUNFRC <0.10* <0.10* <0.10*  CREATININE  --  0.88 0.79    Estimated Creatinine Clearance: 119.3 mL/min (by C-G formula based on SCr of 0.79 mg/dL).   Medical History: Past Medical History:  Diagnosis Date   Acid reflux    ADHD (attention deficit hyperactivity disorder)    Asthma    Back pain    LVAD (left ventricular assist device) present (HCC)    Seizures (HCC)    Resolved     Assessment: 37 yo M with LVAD HMIII placed 07/29/23 c/b chronic driveline infection admitted for possible surgery 11/7. Patient had significant bleeding at driveline site on 09/30/23 requiring FFP. Pharmacy consulted for heparin and warfarin.   INR 1.7 Hgb 11.8 stable  platelet count stable. LDH stable at 100s stable. No s/sx of bleeding.   Heparin level undetectable as expected on low, fixed dosage.  -Heparin drip running in PIV so moving forward labs can be drawn from PICC - also patient preference  Warfarin PTA: 3mg  MWF, 2mg  TTSS but INRs have been subtherapeutic outpatient.  Goal of Therapy:  Heparin level <0.3 units/ml - no titration unless directed by MD INR 2- 2.5 long term >> plan for 1.5-2 while undergoing debridements  Monitor platelets by anticoagulation protocol: Yes   Plan:   Continue heparin gtt at 500 units/hr Continue Warfarin 2.5 mg daily Monitor daily heparin level, INR, CBC, and for s/sx of bleeding   Jenetta Downer, [facility] Clinical Pharmacist  11/11/2023 10:55 AM   [facility] pharmacy phone numbers are listed on [email or web address]

## 2023-11-11 NOTE — Progress Notes (Signed)
Per Dr Donata Clay pt may have full liquid breakfast but must be NPO after 8 am in the morning in prep for surgery tomorrow afternoon. This was discussed with bedside nurse.  Carlton Adam RN, BSN VAD Coordinator 24/7 Pager [phone]

## 2023-11-11 NOTE — Progress Notes (Signed)
Wound vac alarm  goes off multiple times showing leakage. Reinforced with Tegaderm and vac dressing.   Suction fluctuates @ 50-100. Minimal output. Care ongoing.

## 2023-11-11 NOTE — Progress Notes (Addendum)
4 Days Post-Op Procedure(s) (LRB): VAD TUNNEL WOUND DEBRIDEMENT (N/A) WOUND VAC CHANGE (N/A) Subjective: Patient examined, results of last wound culture and sensitivities reviewed Plan return to OR tomorrow for Wound washout and wound VAC sponge removal Objective: Vital signs in last 24 hours: Temp:  [97.6 F (36.4 C)-98.6 F (37 C)] 97.8 F (36.6 C) (11/19 1144) Pulse Rate:  [63-80] 80 (11/19 0827) Cardiac Rhythm: Normal sinus rhythm (11/19 1144) Resp:  [8-16] 16 (11/19 1144) BP: (91-108)/(64-78) 91/74 (11/19 1144) SpO2:  [98 %] 98 % (11/19 1144) Weight:  [66.9 kg] 66.9 kg (11/19 0434)  Hemodynamic parameters for last 24 hours:  nsr  Intake/Output from previous day: 11/18 0701 - 11/19 0700 In: 1057 [P.O.:480; I.V.:347.4; IV Piggyback:229.6] Out: 2250 [Urine:2250] Intake/Output this shift: Total I/O In: 242.6 [I.V.:126.5; IV Piggyback:116.1] Out: -   Exam       Exam    General- alert and comfortable. Vac sponge compressed    Neck- no JVD, no cervical adenopathy palpable, no carotid bruit   Lungs- clear without rales, wheezes   Cor- regular rate and rhythm, normal VAD hum , gallop   Abdomen- soft, non-tender   Extremities - warm, non-tender, minimal edema   Neuro- oriented, appropriate, no focal weakness   Lab Results: Recent Labs    11/10/23 0800 11/11/23 0500  WBC 7.9 6.4  HGB 11.9* 11.8*  HCT 37.7* 37.3*  PLT 301 270   BMET:  Recent Labs    11/10/23 0800 11/11/23 0500  NA 138 137  K 3.8 3.8  CL 104 101  CO2 26 27  GLUCOSE 110* 99  BUN 15 12  CREATININE 0.88 0.79  CALCIUM 9.0 8.9    PT/INR:  Recent Labs    11/11/23 0500  LABPROT 20.3*  INR 1.7*   ABG    Component Value Date/Time   PHART 7.510 (H) 08/03/2023 0445   HCO3 27.3 08/03/2023 0445   TCO2 28 08/03/2023 0445   ACIDBASEDEF 2.0 07/31/2023 1704   O2SAT 68.1 08/08/2023 0535   CBG (last 3)  Recent Labs    11/10/23 1832  GLUCAP 134*    Assessment/Plan: S/P Procedure(s)  (LRB): VAD TUNNEL WOUND DEBRIDEMENT (N/A) WOUND VAC CHANGE (N/A) Return to OR tomorrow- patient understands and agrees to proceed. INR is 1.7   LOS: 14 days    Lovett Sox 11/11/2023

## 2023-11-11 NOTE — Progress Notes (Signed)
[facility]  Date of Admission:  10/28/2023   Total days of inpatient antibiotics 13  Principal Problem:   Infection associated with driveline of left ventricular assist device (LVAD) (HCC)          Assessment: 37 YM admitted with:  #Persistent driveline infection #Reported cefazolin allergy versus chlorhexidine - On 11/1 wound culture grew MSSA - 11/6 CT no associated interval fluid collection, mild splenomegaly - 11/I&D and wound VAC placement or cultures growing Enterobacter and MSSA - Repeat OR on 11/15 growing Enterobacter, MSSA and stenotrophamonas Plan: -D/C cefepime -Start nafcillin to cover MSSA, Levaquin for Enterobacter and stenotrophomonas, add minocycline for double coverage of stenotrophomonas. - Send minocycline sensitivities for stenotrophamonas - Follow-up or cultures Microbiology:   Antibiotics: Cefepime 11/5 - 11/6 Daptomycin 11/6 - 11/10 Cefepime 11/11-present  SUBJECTIVE: In bed.  Family at bedside. Interval: Afebrile overnight, wbc 6.4k  Review of Systems: Review of Systems  All other systems reviewed and are negative.    Scheduled Meds:  buprenorphine-naloxone  1 tablet Sublingual Daily   digoxin  0.125 mg Oral Daily   gabapentin  300 mg Oral TID   ivabradine  5 mg Oral BID WC   levofloxacin  750 mg Oral Daily   losartan  25 mg Oral BID   minocycline  200 mg Oral BID   pantoprazole  40 mg Oral Daily   sertraline  25 mg Oral Daily   sodium chloride flush  10-40 mL Intracatheter Q12H   spironolactone  25 mg Oral Daily   warfarin  2.5 mg Oral q1600   Warfarin - Pharmacist Dosing Inpatient   Does not apply q1600   zinc sulfate (50mg  elemental zinc)  220 mg Oral Daily   Continuous Infusions:  heparin 500 Units/hr (11/11/23 1238)   nafcillin 12 g in sodium chloride 0.9 % 500 mL continuous infusion     PRN Meds:.acetaminophen, HYDROmorphone (DILAUDID) injection, HYDROmorphone, hydrOXYzine, mouth rinse,  sodium chloride flush, traZODone Allergies  Allergen Reactions   Chlorhexidine Dermatitis and Rash   Other Anaphylaxis    Tree Nuts   Peanuts [Peanut Oil] Anaphylaxis    OBJECTIVE: Vitals:   11/11/23 0809 11/11/23 0827 11/11/23 0900 11/11/23 1144  BP: 96/64   91/74  Pulse: 63 80    Resp: 15  12 11   Temp:   98.2 F (36.8 C) 97.8 F (36.6 C)  TempSrc:    Oral  SpO2: 98%   98%  Weight:      Height:       Body mass index is 23.1 kg/m.  Physical Exam Constitutional:      General: He is not in acute distress.    Appearance: He is normal weight. He is not toxic-appearing.  HENT:     Head: Normocephalic and atraumatic.     Right Ear: External ear normal.     Left Ear: External ear normal.     Nose: No congestion or rhinorrhea.     Mouth/Throat:     Mouth: Mucous membranes are moist.     Pharynx: Oropharynx is clear.  Eyes:     Extraocular Movements: Extraocular movements intact.     Conjunctiva/sclera: Conjunctivae normal.     Pupils: Pupils are equal, round, and reactive to light.  Cardiovascular:     Rate and Rhythm: Normal rate and regular rhythm.     Heart sounds: No murmur heard.    No friction rub. No gallop.  Pulmonary:  Effort: Pulmonary effort is normal.     Breath sounds: Normal breath sounds.  Abdominal:     General: Abdomen is flat. Bowel sounds are normal.     Palpations: Abdomen is soft.     Comments: Wound VAC in place.  Musculoskeletal:        General: No swelling.     Cervical back: Normal range of motion and neck supple.  Skin:    General: Skin is warm and dry.  Neurological:     General: No focal deficit present.     Mental Status: He is oriented to person, place, and time.  Psychiatric:        Mood and Affect: Mood normal.       Lab Results Lab Results  Component Value Date   WBC 6.4 11/11/2023   HGB 11.8 (L) 11/11/2023   HCT 37.3 (L) 11/11/2023   MCV 86.1 11/11/2023   PLT 270 11/11/2023    Lab Results  Component Value Date    CREATININE 0.79 11/11/2023   BUN 12 11/11/2023   NA 137 11/11/2023   K 3.8 11/11/2023   CL 101 11/11/2023   CO2 27 11/11/2023    Lab Results  Component Value Date   ALT 8 10/28/2023   AST 16 10/28/2023   ALKPHOS 150 (H) 10/28/2023   BILITOT 0.5 10/28/2023        [name], MD [facility] 11/11/2023, 1:36 PM I have personally spent 52 minutes involved in face-to-face and non-face-to-face activities for this patient on the day of the visit. Professional time spent includes the following activities: Preparing to see the patient (review of tests), Obtaining and/or reviewing separately obtained history (admission/discharge record), Performing a medically appropriate examination and/or evaluation , Ordering medications/tests/procedures, referring and communicating with other health care professionals, Documenting clinical information in the EMR, Independently interpreting results (not separately reported), Communicating results to the patient/family/caregiver, Counseling and educating the patient/family/caregiver and Care coordination (not separately reported).

## 2023-11-11 NOTE — Progress Notes (Signed)
LVAD Coordinator Rounding Note:  Admitted 10/28/23 to heart failure service due to worsening driveline infection. Pt seen in VAD clinic 11/4- wound looked progressively worse. Decision was made to admit for further IV antibiotics and debridement.   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  Pt laying in bed. Continued pain at wound vac site. Wound VAC apparently alarmed all night, alarming when I entered the room. Dressing removed and completely removed leaving sponge intact. See below for full details.   Rash scabbed over on arms, neck, and back. Rash still present on abdomen but pt states this is less painful/itchy.   ID consulted. Cultures positive for Staph Aureus and Enterobacter Cloacae and now RARE STENOTROPHOMONAS MALTOPHILIA as of 11/07/23. Currently receiving Cefepime 2g q8h.  Pt had wound debridement and wound vac change in the OR this past Friday. Will plan to take pt back to OR tomorrow to remove wound VAC.  Vital signs: Temp: 98.2 HR: 78 Doppler Pressure: 74 Auto BP: 96/64 (75) O2 Sat: 98% on RA Wt: 141.1>141.5>145.9>150.2>154>152.6 >151>148.5>147.3>144.4>143.7>147.4>147.9lbs    LVAD interrogation reveals:  Speed: 5600 Flow: 3.9 Power: 4.3 w PI: 4.3  Alarms: none last 24 hrs Events: 40 PI events today; 25 yesterday Hematocrit: 37  Fixed speed: 5600 Low speed limit: 5300  Drive Line: Wound vac alarming leakage. Entire Dressing removed. Sponge intact. Dermatac placed with new suction bell using sterile technique. Negative pressure -125. Good seal. Anchor secure. Plan for wound vac removal in OR Wednesday 11/20 with Dr Donata Clay.   Labs:  LDH trend: 133>134>124>146>145>132>160  INR trend: 2.4>1.7>1.3>1.5>1.4>1.5>1.2>1.3>1.5>1.7   WBC trend: 6.2>5.6>7.6>7.2>8.0>5.7>5.6>6.8>7.6>6.3>7.9>6.4  Anticoagulation Plan: -INR Goal: 2-2.5 -ASA Dose: off  Blood Products:  10/29/23>> 2 FFP  Infection:  10/24/23>>wound cx>>STAPH AUREUS; final 10/29/23>>blood  cultures>> no growth 5 days 10/30/23>> wound cx>>RARE STAPHYLOCOCCUS AUREUS  RARE ENTEROBACTER CLOACAE; final  11/07/23>>driveline culture OR>>RARE ENTEROBACTER CLOACAE  RARE STAPHYLOCOCCUS AUREUS  RARE STENOTROPHOMONAS MALTOPHILIA   Drips:  Heparin 500 units/hr  Adverse Events on VAD: - Admitted 09/29/23 with DL infection. Wound cx + staph aureus. Underwent I&D/wound vac change x3. ID saw, discharged home on 6 weeks of IV cefazolin (end date 11/18/23).   Plan/Recommendations:  1. Please page VAD coordinator for any alarms or VAD equipment issues. 2. Page VAD coordinator for wound vac issues 3. VAD coordinator will accompany pt to OR Wednesday 11/20  Carlton Adam RN,BSN VAD Coordinator  Office: [phone]  24/7 Pager: [phone]

## 2023-11-11 NOTE — Progress Notes (Signed)
PICC dressing change:   Existing PICC dressing removed and site care performed using sterile technique. PICC line cleansed with betadine x 2, rinse with VASHE and allowed to dry. STAT lock place OVER Suresite tegaderm and dressed with Suresite tegaderm x 2 for securement. Site red, itchy and weeping.  Pt has known allergy to CHG and adhesive. Pt does not tolerate STAT lock on bare skin. Pt's HHRN has been using silver impregnated biopatch and IV 3000 dressing. VAD Coordinator reached out to IV team they currently do not have dressing supplies for skin sensitives to CHG or adhesive. VAD team does stock dressings for sensitive skin in VAD Clinic. VAD Coordinators to change PICC dressing while pt is hospitalized to ensure proper supplies used to prevent irritation at PICC site. Next dressing change due 11/18/23.  Simmie Davies RN, BSN VAD Coordinator 24/7 Pager [phone]

## 2023-11-11 NOTE — Progress Notes (Signed)
.Patient ID: Joel York, male   DOB: [birth year], 37 y.o.   MRN: [id number]   Advanced Heart Failure VAD Team Note  PCP-Cardiologist: None   Subjective:    11/1: wound culture grew MRSA  11/05 Recurrent driveline infection and PICC site infection w/ RUE cellulitis.    11/06: CT C/A/P: small amount of periprosthetic fluid within the left pleural space adjacent to the proximal portion of the power cord.  11/7: Wound culture with S aureus and Enterobacter cloacae.  11/10: New PICC 11/12: Wound culture 11/1 MRSA corrected to MSSA. Dapto stopped. Continued IV cefepime. 11/15 OR debridement with wound vac change  Had multiple wound vac alarms overnight with loss of seal.   VAD coordinator attempting to repair this am while I was in the room. Site is sore.   Remains on IV abx. AF. On heparin/warfarin. INR 1.7  VAD Interrogation: Flow 4.2 L/min, Speed 5600 RPM, Power 4.0  watts, PI 3.0  Objective:    Vital Signs:   Temp:  [97.6 F (36.4 C)-98.6 F (37 C)] 98.2 F (36.8 C) (11/19 0900) Pulse Rate:  [63-80] 80 (11/19 0827) Resp:  [8-15] 12 (11/19 0900) BP: (94-112)/(64-91) 96/64 (11/19 0809) SpO2:  [98 %] 98 % (11/19 0809) Weight:  [66.9 kg] 66.9 kg (11/19 0434) Last BM Date : 11/09/23 Mean arterial Pressure 70-80s  Intake/Output:   Intake/Output Summary (Last 24 hours) at 11/11/2023 1049 Last data filed at 11/10/2023 2200 Gross per 24 hour  Intake 817.02 ml  Output 1750 ml  Net -932.98 ml    Physical Exam  General:  NAD.  HEENT: normal  Neck: supple. JVP not elevated.  Carotids 2+ bilat; no bruits. No lymphadenopathy or thryomegaly appreciated. Cor: LVAD hum.  Lungs: Clear. Abdomen: soft, nontender, non-distended. No hepatosplenomegaly. No bruits or masses. Good bowel sounds. Driveline site with sponge. Site is clean. VAD coordinator replacing dressing. Anchor in place.  Extremities: no cyanosis, clubbing, rash. Warm no edema  Neuro: alert & oriented x 3. No focal  deficits. Moves all 4 without problem    Telemetry   SR 70-80s (Personally reviewed)    Labs   Basic Metabolic Panel: Recent Labs  Lab 11/06/23 0530 11/07/23 0512 11/08/23 0354 11/10/23 0800 11/11/23 0500  NA 134* 134* 133* 138 137  K 4.3 4.1 4.3 3.8 3.8  CL 99 100 99 104 101  CO2 28 26 24 26 27   GLUCOSE 93 104* 191* 110* 99  BUN 13 24* 25* 15 12  CREATININE 0.99 0.72 0.73 0.88 0.79  CALCIUM 9.1 8.8* 9.0 9.0 8.9    Liver Function Tests: No results for input(s): "AST", "ALT", "ALKPHOS", "BILITOT", "PROT", "ALBUMIN" in the last 168 hours.  No results for input(s): "LIPASE", "AMYLASE" in the last 168 hours. No results for input(s): "AMMONIA" in the last 168 hours.  CBC: Recent Labs  Lab 11/05/23 0505 11/06/23 0530 11/07/23 0512 11/10/23 0800 11/11/23 0500  WBC 6.8 7.6 6.3 7.9 6.4  HGB 11.9* 12.7* 12.2* 11.9* 11.8*  HCT 36.9* 39.5 38.6* 37.7* 37.3*  MCV 86.6 84.9 84.5 87.1 86.1  PLT 282 291 273 301 270    INR: Recent Labs  Lab 11/07/23 0512 11/08/23 0354 11/09/23 0524 11/10/23 0800 11/11/23 0500  INR 1.5* 1.5* 1.6* 1.7* 1.7*      Imaging   No results found.   Medications:     Scheduled Medications:  buprenorphine-naloxone  1 tablet Sublingual Daily   digoxin  0.125 mg Oral Daily   gabapentin  300 mg  Oral TID   ivabradine  5 mg Oral BID WC   losartan  25 mg Oral BID   pantoprazole  40 mg Oral Daily   sertraline  25 mg Oral Daily   sodium chloride flush  10-40 mL Intracatheter Q12H   spironolactone  25 mg Oral Daily   warfarin  2.5 mg Oral q1600   Warfarin - Pharmacist Dosing Inpatient   Does not apply q1600   zinc sulfate (50mg  elemental zinc)  220 mg Oral Daily    Infusions:  ceFEPime (MAXIPIME) IV 2 g (11/11/23 0448)   heparin 500 Units/hr (11/10/23 1400)    PRN Medications: acetaminophen, HYDROmorphone (DILAUDID) injection, HYDROmorphone, hydrOXYzine, mouth rinse, sodium chloride flush, traZODone   Patient Profile   Joel York is a 37 yo male with PMH of 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 Now presenting with driveline and PICC line infection.   Assessment/Plan:   1. 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 Went for I&D/wound vac change x3. Discharged home with 6 weeks of IV abx.  - Now re-admitted with persistent driveline infection.  - Wound culture from 11/1 grew MRSA. Corrected to MSSA on 11/12. Dapto stopped. - CT C/A/P 11/06 w/ small amount of periprosthetic fluid within the left pleural space adjacent to the proximal portion of the power cord.  - Wound culture from 11/7 with S. aureus and Enterobacter cloacae - s/p repeat debridement and wound vac change 11/15 - site being repaired to day - Scheduled for return to OR tomorrow to remove VAC and proceed just with VASHE dressings - continue IV abx   2. PICC line infection - PICC pulled in clinic 10/27/23 - PICC replaced 11/02/23 - site ok   3. 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 Echo EF <20%,  RV mildly reduced, - Etiology uncertain. ? 2/2 PVCs vs genetically mediated +/- hypertension.  - LHC: normal coronaries.  - cMRI:  LV markedly dilated EF 10% RV 17% NICM - Underwent HM-III VAD on 07/28/25. Much improved NYHA I-II  - INR 1.7, goal INR 1.5 -2 on warfarin/low dose heparin. Will continue heparin until INR 1.8 Discussed warfarin dosing with PharmD personally. - Continue digoxin 0.125 mg daily - Continue Losartan 25 mg BID (dose reduced 11/12 d/t soft MAP) - Continue spiro 25 mg daily - Continue Ivabradine 5 BID   4. 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 Hx drug abuse/Tobacco abuse - reports quit smoking 7/24, cotinine level at 30.1 09/22/23 - Now on subaxone with oral and IV dilaudid - Narcotic requirements/request far out of proportion to typical DL wounds.  Suspect significant drug-seeking behavior  - Wean pain meds as able. Discussed with Dr. Donata Clay and VAD coordinator   Length of Stay: 14  [name], MD 11/11/2023, 10:49 AM  VAD Team --- VAD ISSUES ONLY--- Pager [phone] (7am - 7am)  Advanced Heart Failure Team  Pager [phone] (M-F; 7a - 5p)  Please contact [facility] for night-coverage after hours (5p -7a ) and weekends on [email or web address]

## 2023-11-12 ENCOUNTER — Encounter (HOSPITAL_COMMUNITY): Payer: Self-pay | Admitting: Cardiology

## 2023-11-12 ENCOUNTER — Other Ambulatory Visit: Payer: Self-pay

## 2023-11-12 ENCOUNTER — Inpatient Hospital Stay (HOSPITAL_COMMUNITY): Payer: Medicaid Other | Admitting: Certified Registered Nurse Anesthetist

## 2023-11-12 ENCOUNTER — Encounter (HOSPITAL_COMMUNITY)
Admission: AD | Disposition: A | Discharge status: Home / Self Care | Payer: Self-pay | Source: Home / Self Care | Attending: Cardiology

## 2023-11-12 ENCOUNTER — Ambulatory Visit: Payer: Medicaid Other | Admitting: Infectious Diseases

## 2023-11-12 DIAGNOSIS — T827XXD Infection and inflammatory reaction due to other cardiac and vascular devices, implants and grafts, subsequent encounter: Secondary | ICD-10-CM

## 2023-11-12 DIAGNOSIS — T827XXA Infection and inflammatory reaction due to other cardiac and vascular devices, implants and grafts, initial encounter: Secondary | ICD-10-CM | POA: Diagnosis not present

## 2023-11-12 DIAGNOSIS — F17210 Nicotine dependence, cigarettes, uncomplicated: Secondary | ICD-10-CM | POA: Diagnosis not present

## 2023-11-12 DIAGNOSIS — I5023 Acute on chronic systolic (congestive) heart failure: Secondary | ICD-10-CM | POA: Diagnosis not present

## 2023-11-12 DIAGNOSIS — J45909 Unspecified asthma, uncomplicated: Secondary | ICD-10-CM | POA: Diagnosis not present

## 2023-11-12 HISTORY — PX: APPLICATION OF WOUND VAC: SHX5189

## 2023-11-12 LAB — CBC
HCT: 36.6 % — ABNORMAL LOW (ref 39.0–52.0)
Hemoglobin: 11.7 g/dL — ABNORMAL LOW (ref 13.0–17.0)
MCH: 26.8 pg (ref 26.0–34.0)
MCHC: 32 g/dL (ref 30.0–36.0)
MCV: 83.8 fL (ref 80.0–100.0)
Platelets: 251 10*3/uL (ref 150–400)
RBC: 4.37 MIL/uL (ref 4.22–5.81)
RDW: 13.7 % (ref 11.5–15.5)
WBC: 8.6 10*3/uL (ref 4.0–10.5)
nRBC: 0 % (ref 0.0–0.2)

## 2023-11-12 LAB — BASIC METABOLIC PANEL
Anion gap: 7 (ref 5–15)
BUN: 18 mg/dL (ref 6–20)
CO2: 29 mmol/L (ref 22–32)
Calcium: 8.9 mg/dL (ref 8.9–10.3)
Chloride: 101 mmol/L (ref 98–111)
Creatinine, Ser: 0.9 mg/dL (ref 0.61–1.24)
GFR, Estimated: >60 mL/min (ref >60)
Glucose, Bld: 114 mg/dL — ABNORMAL HIGH (ref 70–99)
Potassium: 3.7 mmol/L (ref 3.5–5.1)
Sodium: 137 mmol/L (ref 135–145)

## 2023-11-12 LAB — LACTATE DEHYDROGENASE: LDH: 185 U/L (ref 98–192)

## 2023-11-12 LAB — PROTIME-INR
INR: 2.1 — ABNORMAL HIGH (ref 0.8–1.2)
Prothrombin Time: 23.5 s — ABNORMAL HIGH (ref 11.4–15.2)

## 2023-11-12 LAB — HEPARIN LEVEL (UNFRACTIONATED): Heparin Unfractionated: <0.1 [IU]/mL — ABNORMAL LOW (ref 0.30–0.70)

## 2023-11-12 SURGERY — APPLICATION, WOUND VAC
Anesthesia: Monitor Anesthesia Care | Site: Abdomen

## 2023-11-12 MED ORDER — DEXTROSE IN LACTATED RINGERS 5 % IV SOLN: INTRAVENOUS | Status: AC

## 2023-11-12 MED ORDER — MIDAZOLAM HCL 2 MG/2ML IJ SOLN
INTRAMUSCULAR | Status: DC | PRN
  Administered 2023-11-12: 2 mg via INTRAVENOUS

## 2023-11-12 MED ORDER — SODIUM CHLORIDE 0.9 % IV SOLN: INTRAVENOUS | Status: DC | PRN

## 2023-11-12 MED ORDER — HYDROMORPHONE HCL 1 MG/ML IJ SOLN
INTRAMUSCULAR | Status: AC
  Filled 2023-11-12: qty 1

## 2023-11-12 MED ORDER — PROPOFOL 500 MG/50ML IV EMUL
INTRAVENOUS | Status: DC | PRN
  Administered 2023-11-12: 80 ug/kg/min via INTRAVENOUS

## 2023-11-12 MED ORDER — OXYCODONE HCL 5 MG/5ML PO SOLN: 5.0000 mg | Freq: Once | ORAL | Status: DC | PRN

## 2023-11-12 MED ORDER — LACTATED RINGERS IV SOLN: INTRAVENOUS | Status: DC

## 2023-11-12 MED ORDER — HYDROMORPHONE HCL 1 MG/ML IJ SOLN: 1.0000 mg | INTRAMUSCULAR | Status: DC | PRN

## 2023-11-12 MED ORDER — MIDAZOLAM HCL 2 MG/2ML IJ SOLN
INTRAMUSCULAR | Status: AC
  Filled 2023-11-12: qty 2

## 2023-11-12 MED ORDER — HYDROMORPHONE HCL 1 MG/ML IJ SOLN
1.0000 mg | INTRAMUSCULAR | Status: DC | PRN
  Administered 2023-11-12 – 2023-11-13 (×4): 1 mg via INTRAVENOUS
  Filled 2023-11-12 (×4): qty 1

## 2023-11-12 MED ORDER — ONDANSETRON HCL 4 MG/2ML IJ SOLN
4.0000 mg | Freq: Once | INTRAMUSCULAR | Status: DC | PRN
Start: 2023-11-12 — End: 2023-11-12

## 2023-11-12 MED ORDER — WARFARIN SODIUM 1 MG PO TABS
1.0000 mg | ORAL_TABLET | Freq: Every day | ORAL | Status: DC
  Administered 2023-11-12: 1 mg via ORAL
  Filled 2023-11-12 (×2): qty 1

## 2023-11-12 MED ORDER — HYDROMORPHONE HCL 1 MG/ML IJ SOLN
0.2500 mg | INTRAMUSCULAR | Status: DC | PRN
  Administered 2023-11-12 (×2): 0.5 mg via INTRAVENOUS

## 2023-11-12 MED ORDER — VASHE WOUND IRRIGATION OPTIME
TOPICAL | Status: DC | PRN
  Administered 2023-11-12: 34 [oz_av]

## 2023-11-12 MED ORDER — DEXMEDETOMIDINE HCL IN NACL 80 MCG/20ML IV SOLN
INTRAVENOUS | Status: DC | PRN
  Administered 2023-11-12: 4 ug via INTRAVENOUS
  Administered 2023-11-12: 8 ug via INTRAVENOUS
  Administered 2023-11-12 (×3): 4 ug via INTRAVENOUS

## 2023-11-12 MED ORDER — PHENYLEPHRINE 80 MCG/ML (10ML) SYRINGE FOR IV PUSH (FOR BLOOD PRESSURE SUPPORT)
PREFILLED_SYRINGE | INTRAVENOUS | Status: DC | PRN
  Administered 2023-11-12 (×2): 160 ug via INTRAVENOUS

## 2023-11-12 MED ORDER — CHLORHEXIDINE GLUCONATE 0.12 % MT SOLN: 15.0000 mL | Freq: Once | OROMUCOSAL | Status: DC

## 2023-11-12 MED ORDER — OXYCODONE HCL 5 MG PO TABS: 5.0000 mg | ORAL_TABLET | Freq: Once | ORAL | Status: DC | PRN

## 2023-11-12 MED ORDER — ALBUMIN HUMAN 5 % IV SOLN: INTRAVENOUS | Status: DC | PRN

## 2023-11-12 MED ORDER — ORAL CARE MOUTH RINSE: 15.0000 mL | Freq: Once | OROMUCOSAL | Status: DC

## 2023-11-12 MED ORDER — ONDANSETRON HCL 4 MG/2ML IJ SOLN
INTRAMUSCULAR | Status: DC | PRN
  Administered 2023-11-12: 4 mg via INTRAVENOUS

## 2023-11-12 MED ORDER — FENTANYL CITRATE (PF) 250 MCG/5ML IJ SOLN
INTRAMUSCULAR | Status: AC
  Filled 2023-11-12: qty 5

## 2023-11-12 MED ORDER — FENTANYL CITRATE (PF) 250 MCG/5ML IJ SOLN
INTRAMUSCULAR | Status: DC | PRN
  Administered 2023-11-12: 100 ug via INTRAVENOUS
  Administered 2023-11-12: 50 ug via INTRAVENOUS
  Administered 2023-11-12: 100 ug via INTRAVENOUS

## 2023-11-12 MED ORDER — LIDOCAINE HCL (PF) 1 % IJ SOLN
INTRAMUSCULAR | Status: DC | PRN
Start: 2023-11-12 — End: 2023-11-12
  Administered 2023-11-12: 5 mL

## 2023-11-12 MED ORDER — DIPHENHYDRAMINE HCL 25 MG PO CAPS
25.0000 mg | ORAL_CAPSULE | Freq: Four times a day (QID) | ORAL | Status: DC | PRN
  Administered 2023-11-12 (×2): 25 mg via ORAL
  Filled 2023-11-12 (×2): qty 1

## 2023-11-12 MED ORDER — PROPOFOL 10 MG/ML IV BOLUS
INTRAVENOUS | Status: AC
Start: 2023-11-12 — End: ?
  Filled 2023-11-12: qty 20

## 2023-11-12 SURGICAL SUPPLY — 57 items
BENZOIN TINCTURE AMPULE (MISCELLANEOUS) IMPLANT
BENZOIN TINCTURE PRP APPL 2/3 (GAUZE/BANDAGES/DRESSINGS) IMPLANT
BLADE CLIPPER SURG (BLADE) ×1 IMPLANT
BLADE SURG 10 STRL SS (BLADE) IMPLANT
BLADE SURG 15 STRL LF DISP TIS (BLADE) IMPLANT
BNDG GAUZE DERMACEA FLUFF 4 (GAUZE/BANDAGES/DRESSINGS) IMPLANT
BRUSH SCRUB EZ PLAIN DRY (MISCELLANEOUS) IMPLANT
CANISTER SUCT 3000ML PPV (MISCELLANEOUS) ×1 IMPLANT
CANISTER WOUND CARE 500ML ATS (WOUND CARE) ×1 IMPLANT
CLIP TI WIDE RED SMALL 24 (CLIP) IMPLANT
CNTNR URN SCR LID CUP LEK RST (MISCELLANEOUS) IMPLANT
CONTAINER PROTECT SURGISLUSH (MISCELLANEOUS) ×2 IMPLANT
DRAPE LAPAROSCOPIC ABDOMINAL (DRAPES) ×1 IMPLANT
DRAPE SLUSH/WARMER DISC (DRAPES) IMPLANT
DRSG IV TEGADERM 3.5X4.5 STRL (GAUZE/BANDAGES/DRESSINGS) IMPLANT
DRSG MEPITEL 3X4 ME34 (GAUZE/BANDAGES/DRESSINGS) IMPLANT
DRSG MEPITEL 4X7.2 (GAUZE/BANDAGES/DRESSINGS) IMPLANT
DRSG TEGADERM 4X4.75 (GAUZE/BANDAGES/DRESSINGS) IMPLANT
DRSG VAC GRANUFOAM LG (GAUZE/BANDAGES/DRESSINGS) ×1 IMPLANT
DRSG VAC GRANUFOAM MED (GAUZE/BANDAGES/DRESSINGS) ×1 IMPLANT
DRSG VAC GRANUFOAM SM (GAUZE/BANDAGES/DRESSINGS) ×1 IMPLANT
ELECT REM PT RETURN 9FT ADLT (ELECTROSURGICAL) ×1
ELECTRODE REM PT RTRN 9FT ADLT (ELECTROSURGICAL) ×1 IMPLANT
GAUZE 4X4 16PLY ~~LOC~~+RFID DBL (SPONGE) ×1 IMPLANT
GAUZE PAD ABD 8X10 STRL (GAUZE/BANDAGES/DRESSINGS) IMPLANT
GAUZE SPONGE 4X4 12PLY STRL (GAUZE/BANDAGES/DRESSINGS) IMPLANT
GAUZE XEROFORM 5X9 LF (GAUZE/BANDAGES/DRESSINGS) IMPLANT
GFT MATRIX 2 LAYER 5X5 (Graft) ×1 IMPLANT
GLOVE BIO SURGEON STRL SZ7.5 (GLOVE) ×2 IMPLANT
GOWN STRL REUS W/ TWL LRG LVL3 (GOWN DISPOSABLE) ×2 IMPLANT
GRAFT MATRIX 2 LAYER 5X5 (Graft) IMPLANT
HEMOSTAT POWDER SURGIFOAM 1G (HEMOSTASIS) IMPLANT
HEMOSTAT SURGICEL 2X14 (HEMOSTASIS) IMPLANT
KIT BASIN OR (CUSTOM PROCEDURE TRAY) ×1 IMPLANT
KIT SUCTION CATH 14FR (SUCTIONS) IMPLANT
KIT TURNOVER KIT B (KITS) ×1 IMPLANT
NDL HYPO 25GX1X1/2 BEV (NEEDLE) IMPLANT
NEEDLE HYPO 25GX1X1/2 BEV (NEEDLE) ×1 IMPLANT
NS IRRIG 1000ML POUR BTL (IV SOLUTION) ×1 IMPLANT
PACK GENERAL/GYN (CUSTOM PROCEDURE TRAY) ×1 IMPLANT
PAD ARMBOARD 7.5X6 YLW CONV (MISCELLANEOUS) ×2 IMPLANT
SET HNDPC FAN SPRY TIP SCT (DISPOSABLE) ×1 IMPLANT
SPONGE T-LAP 18X18 ~~LOC~~+RFID (SPONGE) ×4 IMPLANT
SPONGE T-LAP 4X18 ~~LOC~~+RFID (SPONGE) ×1 IMPLANT
STAPLER VISISTAT 35W (STAPLE) IMPLANT
SUT ETHILON 3 0 FSL (SUTURE) IMPLANT
SUT ETHILON 3 0 PS 1 (SUTURE) IMPLANT
SUT VIC AB 1 CTX36XBRD ANBCTR (SUTURE) IMPLANT
SUT VIC AB 2-0 CTX 27 (SUTURE) IMPLANT
SUT VIC AB 3-0 X1 27 (SUTURE) IMPLANT
SWAB COLLECTION DEVICE MRSA (MISCELLANEOUS) IMPLANT
SWAB CULTURE ESWAB REG 1ML (MISCELLANEOUS) IMPLANT
SYR CONTROL 10ML LL (SYRINGE) IMPLANT
TOWEL GREEN STERILE (TOWEL DISPOSABLE) ×1 IMPLANT
TOWEL GREEN STERILE FF (TOWEL DISPOSABLE) ×1 IMPLANT
TRAY FOLEY MTR SLVR 16FR STAT (SET/KITS/TRAYS/PACK) IMPLANT
WATER STERILE IRR 1000ML POUR (IV SOLUTION) ×1 IMPLANT

## 2023-11-12 NOTE — Plan of Care (Signed)
  Problem: Education: Goal: Patient will understand all VAD equipment and how it functions Outcome: Progressing   Problem: Cardiac: Goal: LVAD will function as expected and patient will experience no clinical alarms Outcome: Progressing   Problem: Education: Goal: Knowledge of General Education information will improve Description: Including pain rating scale, medication(s)/side effects and non-pharmacologic comfort measures Outcome: Progressing   Problem: Health Behavior/Discharge Planning: Goal: Ability to manage health-related needs will improve Outcome: Progressing   Problem: Clinical Measurements: Goal: Ability to maintain clinical measurements within normal limits will improve Outcome: Progressing   Problem: Clinical Measurements: Goal: Diagnostic test results will improve Outcome: Progressing   Problem: Clinical Measurements: Goal: Respiratory complications will improve Outcome: Progressing   Problem: Clinical Measurements: Goal: Cardiovascular complication will be avoided Outcome: Progressing   Problem: Activity: Goal: Risk for activity intolerance will decrease Outcome: Progressing   Problem: Coping: Goal: Level of anxiety will decrease Outcome: Progressing   Problem: Elimination: Goal: Will not experience complications related to bowel motility Outcome: Progressing Goal: Will not experience complications related to urinary retention Outcome: Progressing   Problem: Pain Management: Goal: General experience of comfort will improve Outcome: Progressing   Problem: Safety: Goal: Ability to remain free from injury will improve Outcome: Progressing

## 2023-11-12 NOTE — Progress Notes (Signed)
LVAD Coordinator Rounding Note:  Admitted 10/28/23 to heart failure service due to worsening driveline infection. Pt seen in VAD clinic 11/4- wound looked progressively worse. Decision was made to admit for further IV antibiotics and debridement.   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  Pt laying in bed. Pain controlled.   ID consulted. Cultures positive for Staph Aureus and Enterobacter Cloacae and now RARE STENOTROPHOMONAS MALTOPHILIA as of 11/07/23. Currently receiving Nafcillin 12gr every 24 hrs.  Pt had wound debridement and wound vac change in the OR this past Friday. Will plan to take pt back to OR this evening to remove wound VAC.  Vital signs: Temp: 98 HR: 78 Doppler Pressure: 80 Auto BP: 92/76 (83) O2 Sat: 98% on RA Wt:141.1>141.5>145.9>150.2>154>152.6>151>148.5>147.3>144.4>143.7>147.4>147.9>146lbs    LVAD interrogation reveals:  Speed: 5600 Flow: 4 Power: 4.3 w PI: 4.4  Alarms: 3 LF around midnight. Pt tells me that he was bending over when the alarm occurred.  Events: 15 PI events today; 30+ yesterday Hematocrit: 36  Fixed speed: 5600 Low speed limit: 5300  Drive Line: Wound vac working well. No alarms overnight. Negative pressure -125. Good seal. Anchor secure. Plan for wound vac removal in OR this evening with Dr Donata Clay.   Labs:  LDH trend: 133>134>124>146>145>132>160>185  INR trend: 2.4>1.7>1.3>1.5>1.4>1.5>1.2>1.3>1.5>1.7>2.1   WBC trend: 6.2>5.6>7.6>7.2>8.0>5.7>5.6>6.8>7.6>6.3>7.9>6.4>8.6  Anticoagulation Plan: -INR Goal: 2-2.5 -ASA Dose: off  Blood Products:  10/29/23>> 2 FFP  Infection:  10/24/23>>wound cx>>STAPH AUREUS; final 10/29/23>>blood cultures>> no growth 5 days 10/30/23>> wound cx>>RARE STAPHYLOCOCCUS AUREUS  RARE ENTEROBACTER CLOACAE; final  11/07/23>>driveline culture OR>>RARE ENTEROBACTER CLOACAE  RARE STAPHYLOCOCCUS AUREUS  RARE STENOTROPHOMONAS MALTOPHILIA   Drips:  Heparin 500 units/hr-off INR 2.1  Adverse  Events on VAD: - Admitted 09/29/23 with DL infection. Wound cx + staph aureus. Underwent I&D/wound vac change x3. ID saw, discharged home on 6 weeks of IV cefazolin (end date 11/18/23).   Plan/Recommendations:  1. Please page VAD coordinator for any alarms or VAD equipment issues. 2. Page VAD coordinator for wound vac issues 3. VAD coordinator will accompany pt to OR this evening 11/20  Carlton Adam RN,BSN VAD Coordinator  Office: [phone]  24/7 Pager: [phone]

## 2023-11-12 NOTE — Progress Notes (Signed)
.Patient ID: Joel York, male   DOB: 1986-06-19, 37 y.o.   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    11/06: CT C/A/P: small amount of periprosthetic fluid within the left pleural space adjacent to the proximal portion of the power cord.  11/7: Wound culture with S aureus and Enterobacter cloacae.  11/10: New PICC 11/12: Wound culture 11/1 MRSA corrected to MSSA. Dapto stopped. Continued IV cefepime. 11/15 OR debridement with wound vac change  Remains on IV abx. Afebrile. Stil with pain at site. For OR today   VAD Interrogation: Flow 4.2 L/min, Speed 5600 RPM, Power 4.0  watts, PI 3.0  Objective:    Vital Signs:   Temp:  [97.6 F (36.4 C)-98.7 F (37.1 C)] 97.9 F (36.6 C) (11/20 0846) Pulse Rate:  [74-96] 74 (11/20 0944) Resp:  [13-18] 14 (11/20 0940) BP: (86-106)/(56-83) 94/69 (11/20 0955) SpO2:  [98 %] 98 % (11/19 1706) Weight:  [66.2 kg] 66.2 kg (11/20 0512) Last BM Date : 11/12/23 Mean arterial Pressure 70-80s  Intake/Output:   Intake/Output Summary (Last 24 hours) at 11/12/2023 1028 Last data filed at 11/12/2023 0951 Gross per 24 hour  Intake 595.13 ml  Output 675 ml  Net -79.87 ml    Physical Exam  General:  NAD.  HEENT: normal  Neck: supple. JVP not elevated.  Carotids 2+ bilat; no bruits. No lymphadenopathy or thryomegaly appreciated. Cor: LVAD hum.  Lungs: Clear. Abdomen: soft, nontender, non-distended. No hepatosplenomegaly. No bruits or masses. Good bowel sounds. Driveline site with sponge. Site is clean. VAD coordinator replacing dressing. Anchor in place.  Extremities: no cyanosis, clubbing, rash. Warm no edema  Neuro: alert & oriented x 3. No focal deficits. Moves all 4 without problem    Telemetry   SR 70-80s (Personally reviewed)    Labs   Basic Metabolic Panel: Recent Labs   Lab 11/07/23 0512 11/08/23 0354 11/10/23 0800 11/11/23 0500 11/12/23 0520  NA 134* 133* 138 137 137  K 4.1 4.3 3.8 3.8 3.7  CL 100 99 104 101 101  CO2 26 24 26 27 29   GLUCOSE 104* 191* 110* 99 114*  BUN 24* 25* 15 12 18   CREATININE 0.72 0.73 0.88 0.79 0.90  CALCIUM 8.8* 9.0 9.0 8.9 8.9    Liver Function Tests: No results for input(s): "AST", "ALT", "ALKPHOS", "BILITOT", "PROT", "ALBUMIN" in the last 168 hours.  No results for input(s): "LIPASE", "AMYLASE" in the last 168 hours. No results for input(s): "AMMONIA" in the last 168 hours.  CBC: Recent Labs  Lab 11/06/23 0530 11/07/23 0512 11/10/23 0800 11/11/23 0500 11/12/23 0520  WBC 7.6 6.3 7.9 6.4 8.6  HGB 12.7* 12.2* 11.9* 11.8* 11.7*  HCT 39.5 38.6* 37.7* 37.3* 36.6*  MCV 84.9 84.5 87.1 86.1 83.8  PLT 291 273 301 270 251    INR: Recent Labs  Lab 11/08/23 0354 11/09/23 0524 11/10/23 0800 11/11/23 0500 11/12/23 0520  INR 1.5* 1.6* 1.7* 1.7* 2.1*      Imaging   No results found.   Medications:     Scheduled Medications:  buprenorphine-naloxone  1 tablet Sublingual Daily   digoxin  0.125 mg Oral Daily   gabapentin  300 mg Oral TID   ivabradine  5 mg Oral BID WC   levofloxacin  750 mg Oral Daily   losartan  25 mg  Oral BID   minocycline  200 mg Oral BID   pantoprazole  40 mg Oral Daily   sertraline  25 mg Oral Daily   sodium chloride flush  10-40 mL Intracatheter Q12H   spironolactone  25 mg Oral Daily   warfarin  2.5 mg Oral q1600   Warfarin - Pharmacist Dosing Inpatient   Does not apply q1600   zinc sulfate (50mg  elemental zinc)  220 mg Oral Daily    Infusions:  heparin 500 Units/hr (11/12/23 0014)   nafcillin 12 g in sodium chloride 0.9 % 500 mL continuous infusion 20.8 mL/hr at 11/11/23 1727    PRN Medications: acetaminophen, diphenhydrAMINE, HYDROmorphone (DILAUDID) injection, HYDROmorphone, mouth rinse, sodium chloride flush, traZODone   Patient Profile   Joel York is a 37 yo  male with PMH of 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 Now presenting with driveline and PICC line infection.   Assessment/Plan:   1. 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 Went for I&D/wound vac change x3. Discharged home with 6 weeks of IV abx.  - Now re-admitted with persistent driveline infection.  - Wound culture from 11/1 grew MRSA. Corrected to MSSA on 11/12. Dapto stopped. - CT C/A/P 11/06 w/ small amount of periprosthetic fluid within the left pleural space adjacent to the proximal portion of the power cord.  - Wound culture from 11/7 with S. aureus and Enterobacter cloacae - s/p repeat debridement and wound vac change 11/15 - Doing well. To OR today to remove VAC and proceed just with VASHE dressings - continue IV abx  - possibly home tomorrow  2. PICC line infection - PICC pulled in clinic 10/27/23 - PICC replaced 11/02/23 - site ok   3. 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 Echo EF <20%,  RV mildly reduced, - Etiology uncertain. ? 2/2 PVCs vs genetically mediated +/- hypertension.  - LHC: normal coronaries.  - cMRI:  LV markedly dilated EF 10% RV 17% NICM - Underwent HM-III VAD on 07/28/25. Stable NYHA I-II  - INR 2.1, goal INR 1.5 -2 on warfarin/low dose heparin stop heparin - Continue digoxin 0.125 mg daily - Continue Losartan 25 mg BID (dose reduced 11/12 d/t soft MAP) - Continue spiro 25 mg daily - Continue Ivabradine 5 BID   4. 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 Hx drug abuse/Tobacco abuse - reports quit smoking 7/24, cotinine level at 30.1 09/22/23 - Now on subaxone with oral and IV dilaudid - Narcotic requirements/request far out of proportion to typical DL wounds. Suspect significant drug-seeking behavior  - Wean pain meds as able    Length of Stay: 15  [name],  MD 11/12/2023, 10:28 AM  VAD Team --- VAD ISSUES ONLY--- Pager [phone] (7am - 7am)  Advanced Heart Failure Team  Pager [phone] (M-F; 7a - 5p)  Please contact [facility] for night-coverage after hours (5p -7a ) and weekends on [email or web address]

## 2023-11-12 NOTE — Plan of Care (Signed)
  Problem: Education: Goal: Patient will understand all VAD equipment and how it functions Outcome: Progressing Goal: Patient will be able to verbalize current INR target range and antiplatelet therapy for discharge home Outcome: Progressing   Problem: Cardiac: Goal: LVAD will function as expected and patient will experience no clinical alarms Outcome: Progressing   Problem: Education: Goal: Knowledge of General Education information will improve Description: Including pain rating scale, medication(s)/side effects and non-pharmacologic comfort measures Outcome: Progressing   Problem: Health Behavior/Discharge Planning: Goal: Ability to manage health-related needs will improve Outcome: Progressing   Problem: Clinical Measurements: Goal: Ability to maintain clinical measurements within normal limits will improve Outcome: Progressing Goal: Will remain free from infection Outcome: Progressing Goal: Diagnostic test results will improve Outcome: Progressing Goal: Respiratory complications will improve Outcome: Progressing Goal: Cardiovascular complication will be avoided Outcome: Progressing   Problem: Activity: Goal: Risk for activity intolerance will decrease Outcome: Progressing   Problem: Nutrition: Goal: Adequate nutrition will be maintained Outcome: Progressing   Problem: Coping: Goal: Level of anxiety will decrease Outcome: Progressing   Problem: Elimination: Goal: Will not experience complications related to bowel motility Outcome: Progressing Goal: Will not experience complications related to urinary retention Outcome: Progressing   Problem: Pain Management: Goal: General experience of comfort will improve Outcome: Progressing   Problem: Safety: Goal: Ability to remain free from injury will improve Outcome: Progressing   Problem: Skin Integrity: Goal: Risk for impaired skin integrity will decrease Outcome: Progressing

## 2023-11-12 NOTE — Progress Notes (Signed)
PHARMACY - ANTICOAGULATION CONSULT NOTE  Pharmacy Consult for heparin > warfarin Indication: LVAD  Allergies  Allergen Reactions   Chlorhexidine Dermatitis and Rash   Other Anaphylaxis    Tree Nuts   Peanuts [Peanut Oil] Anaphylaxis    Patient Measurements: Height: 5\' 7"  (170.2 cm) Weight: 66.2 kg (146 lb) (Scale A) IBW/kg (Calculated) : 66.1 Heparin Dosing Weight: 64 kg  Vital Signs: Temp: 98 F (36.7 C) (11/20 1111) Temp Source: Oral (11/20 1111) BP: 94/69 (11/20 1111) Pulse Rate: 77 (11/20 1111)  Labs: Recent Labs    11/10/23 0800 11/11/23 0500 11/12/23 0520  HGB 11.9* 11.8* 11.7*  HCT 37.7* 37.3* 36.6*  PLT 301 270 251  LABPROT 20.2* 20.3* 23.5*  INR 1.7* 1.7* 2.1*  HEPARINUNFRC <0.10* <0.10* <0.10*  CREATININE 0.88 0.79 0.90    Estimated Creatinine Clearance: 106.1 mL/min (by C-G formula based on SCr of 0.9 mg/dL).   Medical History: Past Medical History:  Diagnosis Date   Acid reflux    ADHD (attention deficit hyperactivity disorder)    Asthma    Back pain    LVAD (left ventricular assist device) present (HCC)    Seizures (HCC)    Resolved     Assessment: 37 yo M with LVAD HMIII placed 07/29/23 c/b chronic driveline infection admitted for possible surgery 11/7. Patient had significant bleeding at driveline site on 09/30/23 requiring FFP. Pharmacy consulted for heparin and warfarin.   INR 1.7>2.1 Hgb 10-11 stable  platelet count stable. LDH stable at 100s stable. No s/sx of bleeding.   Heparin level undetectable as expected on low, fixed dosage. Will back down on warfarin dosing  - levofloxacin added for DLI - prevents warfarin clearance DLI- MSSA> naffcillin, enterobacter/steno - levoflox/minocycline  Warfarin PTA: 3mg  MWF, 2mg  TTSS but INRs have been subtherapeutic outpatient.  Goal of Therapy:  Heparin level <0.3 units/ml - no titration unless directed by MD INR 2- 2.5 long term >> plan for 1.5-2 while undergoing debridements  Monitor platelets  by anticoagulation protocol: Yes   Plan:  Stop heparin Decrease Warfarin 1mg  daily  Monitor daily heparin level, INR, CBC, and for s/sx of bleeding     Leota Sauers Pharm.D. CPP, BCPS Clinical Pharmacist [phone] 11/12/2023 12:50 PM    [facility] pharmacy phone numbers are listed on [email or web address]

## 2023-11-12 NOTE — Brief Op Note (Signed)
11/12/2023  7:19 PM  PATIENT:  Joel York  37 y.o. male  PRE-OPERATIVE DIAGNOSIS:  DRIVELINE INFECTION  POST-OPERATIVE DIAGNOSIS:  DRIVELINE INFECTION  PROCEDURE:  Procedure(s): REMOVAL OF WOUND VAC with application of myriad (N/A), wound Irrigation  SURGEON:  Surgeons and Role:    Lovett Sox, MD - Primary  PHYSICIAN ASSISTANT:   ASSISTANTS: none   ANESTHESIA:   MAC  EBL:  none  BLOOD ADMINISTERED:none  DRAINS: none   LOCAL MEDICATIONS USED:  LIDOCAINE  and Amount: 5ml  SPECIMEN:  Scraping  DISPOSITION OF SPECIMEN:   cultures  COUNTS:  YES  TOURNIQUET:  * No tourniquets in log *  DICTATION: .Dragon Dictation  PLAN OF CARE:  return to 2 C  PATIENT DISPOSITION:  PACU - hemodynamically stable.   Delay start of Pharmacological VTE agent (>24hrs) due to surgical blood loss or risk of bleeding: no

## 2023-11-12 NOTE — Progress Notes (Signed)
VAD Coordinator Procedure Note:   VAD Coordinator met patient in SS 36. Pt undergoing Wound vac removal   per Dr. Maren Beach. Hemodynamics and VAD parameters monitored by myself and anesthesia throughout the procedure. Blood pressures were obtained with automatic cuff on Right arm.    Time: Doppler Auto  BP Flow PI Power Speed  Pre-procedure:  1735  100/86(93) 3.9 3.7 4.2 5600                    Sedation Induction: 1803  94/83(89) 3.5 5.2 4.1 5600   1815  93/69(77) 4.4 2.6 4.2 5600   1830  84/65(73) 4.3 5.4 4.2 5600   1845  91/54(61) 4 4.4 4.1 5600   1900  91/79(84) 4.2 4.1 4.1 5600                    Recovery Area: 1915  97/69(77) 4 4.1 4.1 5600   1930  97/77(85) 4.1 3.4 4.2 5600    Patient tolerated the procedure well. VAD Coordinator accompanied and remained with patient in recovery area.    Patient Disposition: pt transported to [facility] and handoff given to bedside nurse.  Carlton Adam RN, BSN VAD Coordinator 24/7 Pager [phone]

## 2023-11-12 NOTE — Progress Notes (Signed)
Pre Procedure note for inpatients:   Joel York has been scheduled for Procedure(s): VAD TUNNEL WOUND DEBRIDEMENT (N/A) WOUND VAC CHANGE (N/A) today. The various methods of treatment have been discussed with the patient. After consideration of the risks, benefits and treatment options the patient has consented to the planned procedure.   The patient has been seen and labs reviewed. There are no changes in the patient's condition to prevent proceeding with the planned procedure today.  Recent labs:  Lab Results  Component Value Date   WBC 8.6 11/12/2023   HGB 11.7 (L) 11/12/2023   HCT 36.6 (L) 11/12/2023   PLT 251 11/12/2023   GLUCOSE 114 (H) 11/12/2023   CHOL 74 07/15/2023   TRIG 140 07/15/2023   HDL 16 (L) 07/15/2023   LDLCALC 30 07/15/2023   ALT 8 10/28/2023   AST 16 10/28/2023   NA 137 11/12/2023   K 3.7 11/12/2023   CL 101 11/12/2023   CREATININE 0.90 11/12/2023   BUN 18 11/12/2023   CO2 29 11/12/2023   TSH 4.982 (H) 07/14/2023   INR 2.1 (H) 11/12/2023   HGBA1C 6.0 (H) 07/30/2023   MICROALBUR <3.0 (H) 07/14/2023    Lovett Sox, MD 11/12/2023 2:36 PM

## 2023-11-12 NOTE — Op Note (Unsigned)
Joel York, Joel York MEDICAL RECORD NO: [id number] ACCOUNT NO: [id number] DATE OF BIRTH: 09-Mar-1986 FACILITY: [facility] LOCATION: [facility] PHYSICIAN: Kerin Perna III, MD  Operative Report   DATE OF PROCEDURE: 11/12/2023  OPERATIONS PERFORMED: 1.  Removal of wound VAC sponge. 2.  Wound irrigation with Vashe wound solution. 3.  Application of Myriad tissue matrix.  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  ANESTHESIA: MAC.  DESCRIPTION OF PROCEDURE: The patient was checked in preop holding where informed consent was documented and the final details of the procedure were reviewed with the patient.  He was satisfied with the expected benefits, potential risks, and postoperative care.  He agreed to  proceed with surgery under what I felt was an informed consent.  The patient was brought back to the OR by anesthesia and the VAD coordinator who was present during the entire procedure to monitor the VAD equipment and to assist with hemodynamic management of the patient.  The patient was placed supine on the  operating room table.  He was positioned.  He was started on IV conscious sedation by anesthesia.  The previous wound VAC system sheets and sponge were removed.  The patient was then prepped in the abdomen and upper chest and the sterile field was  placed.  A proper timeout was performed.  Wound culture was performed.  The wound was inspected.  It was clean and starting to granulate approximately 90%.  There was no sharp debridement required.  The wound was irrigated with Vashe solution.  A 5 cm  long sheet of Myriad tissue matrix was placed in the lower aspect of the wound away from the power cord.  This was then covered with 4 x 4 gauze soaked in Vashe wound solution and covered with dry gauze and Tegaderm.   The patient was then weaned off the  IV sedation and transported back to the recovery room accompanied by the VAD coordinator.      MUK D: 11/12/2023 7:25:57 pm T: 11/12/2023 9:43:00 pm  JOB: [id number]/ [id number]

## 2023-11-12 NOTE — Anesthesia Preprocedure Evaluation (Signed)
Anesthesia Evaluation  Patient identified by MRN, date of birth, ID band Patient awake    Reviewed: Allergy & Precautions, NPO status , Patient's Chart, lab work & pertinent test results  Airway Mallampati: II  TM Distance: >3 FB Neck ROM: Full    Dental  (+) Teeth Intact, Dental Advisory Given   Pulmonary asthma , Current Smoker   breath sounds clear to auscultation       Cardiovascular +CHF   Rhythm:Regular Rate:Normal     Neuro/Psych Seizures -,  PSYCHIATRIC DISORDERS Anxiety        GI/Hepatic Neg liver ROS,GERD  ,,  Endo/Other  negative endocrine ROS    Renal/GU Renal disease     Musculoskeletal negative musculoskeletal ROS (+)    Abdominal   Peds  Hematology negative hematology ROS (+)   Anesthesia Other Findings   Reproductive/Obstetrics                              Anesthesia Physical Anesthesia Plan  ASA: 4  Anesthesia Plan: MAC   Post-op Pain Management:    Induction: Intravenous  PONV Risk Score and Plan: 2 and TIVA  Airway Management Planned: Natural Airway and Nasal Cannula  Additional Equipment: None  Intra-op Plan:   Post-operative Plan: Extubation in OR  Informed Consent: I have reviewed the patients History and Physical, chart, labs and discussed the procedure including the risks, benefits and alternatives for the proposed anesthesia with the patient or authorized representative who has indicated his/her understanding and acceptance.     Dental advisory given  Plan Discussed with: CRNA  Anesthesia Plan Comments:         Anesthesia Quick Evaluation

## 2023-11-13 ENCOUNTER — Encounter (HOSPITAL_COMMUNITY): Payer: Self-pay | Admitting: Cardiothoracic Surgery

## 2023-11-13 ENCOUNTER — Other Ambulatory Visit (HOSPITAL_COMMUNITY): Payer: Self-pay

## 2023-11-13 DIAGNOSIS — A4901 Methicillin susceptible Staphylococcus aureus infection, unspecified site: Secondary | ICD-10-CM | POA: Diagnosis not present

## 2023-11-13 DIAGNOSIS — T827XXA Infection and inflammatory reaction due to other cardiac and vascular devices, implants and grafts, initial encounter: Secondary | ICD-10-CM | POA: Diagnosis not present

## 2023-11-13 DIAGNOSIS — I5023 Acute on chronic systolic (congestive) heart failure: Secondary | ICD-10-CM | POA: Diagnosis not present

## 2023-11-13 LAB — BASIC METABOLIC PANEL
Anion gap: 8 (ref 5–15)
BUN: 17 mg/dL (ref 6–20)
CO2: 27 mmol/L (ref 22–32)
Calcium: 8.9 mg/dL (ref 8.9–10.3)
Chloride: 102 mmol/L (ref 98–111)
Creatinine, Ser: 0.91 mg/dL (ref 0.61–1.24)
GFR, Estimated: >60 mL/min (ref >60)
Glucose, Bld: 98 mg/dL (ref 70–99)
Potassium: 3.6 mmol/L (ref 3.5–5.1)
Sodium: 137 mmol/L (ref 135–145)

## 2023-11-13 LAB — PROTIME-INR
INR: 2 — ABNORMAL HIGH (ref 0.8–1.2)
Prothrombin Time: 23.3 s — ABNORMAL HIGH (ref 11.4–15.2)

## 2023-11-13 LAB — CBC
HCT: 36.6 % — ABNORMAL LOW (ref 39.0–52.0)
Hemoglobin: 11.7 g/dL — ABNORMAL LOW (ref 13.0–17.0)
MCH: 27 pg (ref 26.0–34.0)
MCHC: 32 g/dL (ref 30.0–36.0)
MCV: 84.3 fL (ref 80.0–100.0)
Platelets: 259 10*3/uL (ref 150–400)
RBC: 4.34 MIL/uL (ref 4.22–5.81)
RDW: 13.9 % (ref 11.5–15.5)
WBC: 10 10*3/uL (ref 4.0–10.5)
nRBC: 0 % (ref 0.0–0.2)

## 2023-11-13 MED ORDER — POTASSIUM CHLORIDE CRYS ER 20 MEQ PO TBCR
20.0000 meq | EXTENDED_RELEASE_TABLET | Freq: Once | ORAL | Status: AC
  Administered 2023-11-13: 20 meq via ORAL
  Filled 2023-11-13: qty 1

## 2023-11-13 MED ORDER — HEPARIN SOD (PORK) LOCK FLUSH 100 UNIT/ML IV SOLN: 250.0000 [IU] | INTRAVENOUS | Status: DC | PRN

## 2023-11-13 MED ORDER — LEVOFLOXACIN 750 MG PO TABS
750.0000 mg | ORAL_TABLET | Freq: Every day | ORAL | 0 refills | Status: DC
  Filled 2023-11-13: qty 26, 26d supply, fill #0

## 2023-11-13 MED ORDER — WARFARIN SODIUM 2.5 MG PO TABS
ORAL_TABLET | ORAL | 5 refills | Status: DC
  Filled 2023-11-13: qty 30, 30d supply, fill #0

## 2023-11-13 MED ORDER — HYDROXYZINE HCL 25 MG PO TABS
25.0000 mg | ORAL_TABLET | Freq: Three times a day (TID) | ORAL | 0 refills | Status: AC | PRN
  Filled 2023-11-13: qty 30, 10d supply, fill #0

## 2023-11-13 MED ORDER — HYDROMORPHONE HCL 4 MG PO TABS
ORAL_TABLET | ORAL | 0 refills | Status: AC
  Filled 2023-11-13 (×2): qty 19, 10d supply, fill #0

## 2023-11-13 MED ORDER — ONDANSETRON HCL 4 MG/2ML IJ SOLN
4.0000 mg | Freq: Once | INTRAMUSCULAR | Status: AC
  Administered 2023-11-13: 4 mg via INTRAVENOUS
  Filled 2023-11-13: qty 2

## 2023-11-13 MED ORDER — CEFAZOLIN IV (FOR PTA / DISCHARGE USE ONLY): 2.0000 g | Freq: Three times a day (TID) | INTRAVENOUS | 0 refills | Status: AC

## 2023-11-13 MED ORDER — CEFAZOLIN SODIUM-DEXTROSE 2-4 GM/100ML-% IV SOLN
2.0000 g | Freq: Three times a day (TID) | INTRAVENOUS | Status: DC
  Administered 2023-11-13: 2 g via INTRAVENOUS
  Filled 2023-11-13: qty 100

## 2023-11-13 MED ORDER — WARFARIN SODIUM 2.5 MG PO TABS
2.5000 mg | ORAL_TABLET | Freq: Every day | ORAL | Status: DC
  Filled 2023-11-13: qty 1

## 2023-11-13 MED ORDER — MINOCYCLINE HCL 100 MG PO CAPS
200.0000 mg | ORAL_CAPSULE | Freq: Two times a day (BID) | ORAL | 0 refills | Status: AC
  Filled 2023-11-13: qty 120, 30d supply, fill #0

## 2023-11-13 NOTE — Progress Notes (Signed)
1 Day Post-Op Procedure(s) (LRB): REMOVAL OF WOUND VAC with application of myriad (N/A) Subjective: Patient examined, VAD tunnel wound personally repacked at the bedside with Vashe wet-to-dry wound solution.  Patient's wife instructed on wound care to be completed at home.  Patient is stable clinically on appropriate IV and p.o. antibiotics.  The wound is now shallow and safe to be treated as outpatient with clinic follow-up.  INR is therapeutic at 2.0.  Plan discharge home  Objective: Vital signs in last 24 hours: Temp:  [97.7 F (36.5 C)-98.4 F (36.9 C)] 98.4 F (36.9 C) (11/21 0444) Pulse Rate:  [73-143] 103 (11/21 1121) Cardiac Rhythm: Normal sinus rhythm;Sinus tachycardia (11/21 0847) Resp:  [9-17] 16 (11/21 0529) BP: (93-109)/(57-85) 109/76 (11/21 0444) SpO2:  [96 %-100 %] 100 % (11/21 0444) Weight:  [66.8 kg] 66.8 kg (11/21 0529)  Hemodynamic parameters for last 24 hours:  Sinus rhythm afebrile  Intake/Output from previous day: 11/20 0701 - 11/21 0700 In: 1635.4 [P.O.:480; I.V.:604.4; IV Piggyback:551.1] Out: 675 [Urine:675] Intake/Output this shift: Total I/O In: -  Out: 1225 [Urine:1225]  Exam Patient alert and comfortable Lungs clear Heart rate regular, normal VAD hum Abdominal wound clean and granulating in.  Plan daily wet-to-dry dressing changes with Vashe wound solution. PICC line site personally cleaned and redressed today.  Lab Results: Recent Labs    11/12/23 0520 11/13/23 0400  WBC 8.6 10.0  HGB 11.7* 11.7*  HCT 36.6* 36.6*  PLT 251 259   BMET:  Recent Labs    11/12/23 0520 11/13/23 0400  NA 137 137  K 3.7 3.6  CL 101 102  CO2 29 27  GLUCOSE 114* 98  BUN 18 17  CREATININE 0.90 0.91  CALCIUM 8.9 8.9    PT/INR:  Recent Labs    11/13/23 0400  LABPROT 23.3*  INR 2.0*   ABG    Component Value Date/Time   PHART 7.510 (H) 08/03/2023 0445   HCO3 27.3 08/03/2023 0445   TCO2 28 08/03/2023 0445   ACIDBASEDEF 2.0 07/31/2023 1704    O2SAT 68.1 08/08/2023 0535   CBG (last 3)  Recent Labs    11/10/23 1832  GLUCAP 134*    Assessment/Plan: S/P Procedure(s) (LRB): REMOVAL OF WOUND VAC with application of myriad (N/A) Transition to home care with clinic follow-up for his VAD tunnel wound and antibiotic requirements.   LOS: 16 days    Lovett Sox 11/13/2023

## 2023-11-13 NOTE — Discharge Summary (Addendum)
Advanced Heart Failure Team  Discharge Summary   Patient ID: Joel York MRN: [id number], DOB/AGE: 03-10-1986 37 y.o. 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   Presented to LVAD clinic 10/27/23 with concerns for persistent driveline infection, as well as a PICC line infection. Warfarin held and he was directly admitted from home on 11/5 for I&D with Dr. Maren Beach. Infectious Disease was consulted. CT C/A/P demonstrated small periprosthetic fluid collection in the left pleural space adjacent to the power cord. Blood cultures were obtained, and he was started on empiric IV vanc and cefepime. Placed on low dose heparin drip, while off warfarin. On 11/7 he went for debridement with wound culture, which resulted positive for staph aureus and enterococcus cloacea. He was transitioned to IV Daptomycin. However after 3 days, wound culture was corrected to MSSA and Dapto was stopped and Cefepime restarted. Pain control has remained an obstacle for him given his narcotic tolerance and abuse history. Multiple discussions throughout this admission have been made with patient about need to cut back on narcotic usage to a more reasonable level. On 11/15 he underwent a second debridement and wound culture which showed stenotrophomas, staph aureus, and entercoccus cloacea. Per ID he was started on Minocycline, levofloxacin, and nafcillin. He went back to the OR 11/20 for wound vac removal and re-culture, currently not growth to date. Restarted on Warfarin. Clinically stable for discharge on on IV/oral antibiotics. INR therapeutic.  He  is discharging home with PICC line and outpatient IV antibiotics (Cefazolin 2mg  IV Q8hr + Levoflaxacin 750 mg PO daily + Minocycline 200 PO BID).   Hospital Course by Problem List:  1. 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 Went for I&D/wound vac change x3. Discharged home with 6 weeks of IV abx.  - Now re-admitted with persistent driveline infection.  - CT C/A/P 11/06 w/ small periprosthetic fluid within the left pleural space adjacent to the proximal portion of the power cord. - 11/1 Wound culture + MRSA. 11/12 Corrected to MSSA. Dapto stopped. - 11/7 Dbridement, Wound culture + S. aureus and Enterobacter cloacae - 11/15 Debridement + Vac change, Wound culture +S. Aureus, Enterobacter cloacae, Steno - 11/20 Wound vac removal; VASHE dressings - Discharge abx: Cefazolin 2mg  IV Q8hr + Levoflaxacin 750 mg PO daily + Minocycline 200 PO BID   2. PICC line infection - PICC pulled in clinic 10/27/23 - PICC replaced 11/02/23 - Home with PICC   3. 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 Echo EF <20%,  RV mildly reduced, - Etiology uncertain. ? 2/2 PVCs vs genetically mediated +/- hypertension.  - LHC: normal coronaries.  - cMRI:  LV markedly dilated EF 10% RV 17% NICM - Underwent HM-III VAD on 07/28/25. Stable NYHA I-II  - INR Goal 1.5 - 2. On Warfarin. - Continue digoxin 0.125 mg daily - Continue Losartan 25 mg BID (dose reduced 11/12 d/t soft MAP) - Continue spiro 25 mg daily - Continue Ivabradine 5 BID   4. Hx PVCs - Mexiletine stopped 10/13/23  5. Hx drug abuse/Tobacco abuse - reports quit smoking 7/24, cotinine level at 30.1 09/22/23 - Now on subaxone  - Narcotic requirements/request far out of proportion to typical DL wounds. Suspect significant drug-seeking behavior D/w patient. - Wean pain meds as able   LVAD Interrogation HM III:   Speed: 5600    Flow: 3.7      PI: 4.9       Power: 4.0       Back-up speed: 5300    Discharge Weight Range: 66 kgs Discharge Vitals: Blood pressure 109/76, pulse (!) 103, temperature 98.4 F (36.9 C), temperature source Oral, resp. rate 16, height 5\' 7"  (1.702 m), weight 66.8 kg, SpO2 100%.  Labs: Lab Results  Component Value Date   WBC 10.0 11/13/2023   HGB 11.7 (L) 11/13/2023   HCT 36.6 (L) 11/13/2023   MCV 84.3 11/13/2023   PLT 259 11/13/2023    Recent Labs  Lab 11/13/23 0400  NA 137  K 3.6  CL 102  CO2 27  BUN 17  CREATININE 0.91  CALCIUM 8.9  GLUCOSE 98   Lab Results  Component Value Date   CHOL 74 07/15/2023   HDL 16 (L) 07/15/2023   LDLCALC 30 07/15/2023   TRIG 140 07/15/2023   BNP (last 3 results) Recent Labs    07/14/23 0101 07/31/23 0428 08/05/23 2320  BNP 1,810.8*  2,494.6* 487.2* 483.2*    ProBNP (last 3 results) No results for input(s): "PROBNP" in the last 8760 hours.   Diagnostic Studies/Procedures   No results found.  Discharge Medications   Allergies as of 11/13/2023       Reactions   Chlorhexidine Dermatitis, Rash   Other Anaphylaxis   Tree Nuts   Peanuts [peanut Oil] Anaphylaxis        Medication List     STOP taking these medications    acetaminophen 325 MG tablet Commonly known as: TYLENOL   cyclobenzaprine 5 MG tablet Commonly known as: FLEXERIL   ketorolac 10 MG tablet Commonly known as: TORADOL   predniSONE 10 MG tablet Commonly known as: DELTASONE       TAKE these medications    albuterol 108 (90 Base) MCG/ACT inhaler Commonly known as: VENTOLIN HFA Inhale 2 puffs into the lungs every 6 (six) hours as needed for wheezing or shortness of breath. Shortness of breath   ceFAZolin IVPB Commonly known as: ANCEF Inject 2 g into the vein every 8 (eight) hours. Indication:  MSSA LVAD DLI First Dose: Yes Last Day of Therapy:  12/24/23 Labs - Once weekly:  CBC/D and BMP, Labs - Once weekly: ESR and CRP Method of administration: IV Push Method of  administration may be changed at the discretion of home infusion pharmacist based upon assessment of the patient and/or caregiver's ability to self-administer the medication ordered. What changed: additional instructions   digoxin 0.125 MG tablet Commonly known as: LANOXIN Take 1 tablet (0.125 mg total) by mouth daily.   gabapentin 300 MG capsule Commonly known as: NEURONTIN Take 1 capsule (300 mg total) by mouth 3 (three) times daily.   HYDROmorphone 4 MG tablet Commonly known as: DILAUDID Take 1 tablet (4 mg total) by mouth every 8 (eight) hours as needed for 3 days, THEN 1 tablet (4 mg total) every 12 (twelve) hours as needed for 3 days, THEN 1 tablet (4 mg total) daily as needed for up to 4 days. Start taking on: November 13, 2023 What changed:  medication strength See the new instructions.  hydrOXYzine 25 MG tablet Commonly known as: ATARAX Take 1 tablet (25 mg total) by mouth 3 (three) times daily as needed for itching.   ivabradine 5 MG Tabs tablet Commonly known as: CORLANOR Take 1 tablet (5 mg total) by mouth 2 (two) times daily with a meal.   levofloxacin 750 MG tablet Commonly known as: LEVAQUIN Take 1 tablet (750 mg total) by mouth daily. Start taking on: November 14, 2023   losartan 25 MG tablet Commonly known as: COZAAR Take 1 tablet (25 mg total) by mouth 2 (two) times daily.   minocycline 100 MG capsule Commonly known as: MINOCIN Take 2 capsules (200 mg total) by mouth 2 (two) times daily.   pantoprazole 40 MG tablet Commonly known as: PROTONIX Take 1 tablet (40 mg total) by mouth daily.   sertraline 25 MG tablet Commonly known as: ZOLOFT Take 1 tablet (25 mg total) by mouth daily.   spironolactone 25 MG tablet Commonly known as: ALDACTONE Take 1 tablet (25 mg total) by mouth daily.   Suboxone 8-2 MG Film Generic drug: Buprenorphine HCl-Naloxone HCl Place 1 Film under the tongue daily.   traZODone 50 MG tablet Commonly known as: DESYREL Take  1 tablet (50 mg total) by mouth at bedtime as needed for sleep.   triamcinolone ointment 0.5 % Commonly known as: KENALOG Apply 1 Application topically 2 (two) times daily.   warfarin 2.5 MG tablet Commonly known as: COUMADIN Take as directed. If you are unsure how to take this medication, talk to your nurse or doctor. Original instructions: Take 1 tablet (2.5 mg daily) or as directed by coumadin clinic. What changed:  medication strength additional instructions   Zinc Sulfate 220 (50 Zn) MG Tabs Take 1 tablet (220 mg total) by mouth daily.        Disposition   The patient will be discharged in stable condition to home. Discharge Instructions     Diet - low sodium heart healthy   Complete by: As directed    Increase activity slowly   Complete by: As directed    Page VAD Coordinator at [phone]  Notify for: any VAD alarms, sustained elevations of power >10 watts, sustained drop in Pulse Index <3   Complete by: As directed    Notify for:  any VAD alarms sustained elevations of power >10 watts sustained drop in Pulse Index <3         Follow-up Information     Doreene Nest, NP. Go in 24 day(s).   Specialty: Internal Medicine Why: Hospital follow up appointment scheduled for Friday, November 28, 2023 at 2:00 PM.  PLEASE ARRIVE 10-15 minutes early.  PLEASE call cancel/reschedule if you CANNOT make appointment. Contact information: [address] [place] [address] [phone]         Odette Fraction, MD Follow up.   Specialty: Infectious Diseases Why: 11/25/23 at 4pm. Please call to reschedule if you are not able to make this appointment. Contact information: [address] [place] [address] [phone]         [facility] Follow up on 11/18/2023.   Specialty: Cardiology Why: VAD Clinic at [facility] information: [address] [place]  [address] [phone]                  Duration of Discharge Encounter: Greater than 35 minutes   Signed, [place] Shia Delaine  11/13/2023, 2:34 PM

## 2023-11-13 NOTE — Progress Notes (Addendum)
PHARMACY - ANTICOAGULATION CONSULT NOTE  Pharmacy Consult for heparin > warfarin Indication: LVAD  Allergies  Allergen Reactions   Chlorhexidine Dermatitis and Rash   Other Anaphylaxis    Tree Nuts   Peanuts [Peanut Oil] Anaphylaxis    Patient Measurements: Height: 5\' 7"  (170.2 cm) Weight: 66.8 kg (147 lb 4.8 oz) (Scale A) IBW/kg (Calculated) : 66.1 Heparin Dosing Weight: 64 kg  Vital Signs: Temp: 98.4 F (36.9 C) (11/21 0444) Temp Source: Oral (11/21 0444) BP: 109/76 (11/21 0444) Pulse Rate: 103 (11/21 1121)  Labs: Recent Labs    11/11/23 0500 11/12/23 0520 11/13/23 0400  HGB 11.8* 11.7* 11.7*  HCT 37.3* 36.6* 36.6*  PLT 270 251 259  LABPROT 20.3* 23.5* 23.3*  INR 1.7* 2.1* 2.0*  HEPARINUNFRC <0.10* <0.10*  --   CREATININE 0.79 0.90 0.91    Estimated Creatinine Clearance: 104.9 mL/min (by C-G formula based on SCr of 0.91 mg/dL).   Medical History: Past Medical History:  Diagnosis Date   Acid reflux    ADHD (attention deficit hyperactivity disorder)    Asthma    Back pain    LVAD (left ventricular assist device) present (HCC)    Seizures (HCC)    Resolved     Assessment: 37 yo M with LVAD HMIII placed 07/29/23 c/b chronic driveline infection admitted for possible surgery 11/7. Patient had significant bleeding at driveline site on 09/30/23 requiring FFP. Pharmacy consulted for heparin and warfarin.   Heparin infusion off with INR 2.1. INR this morning came back at 2. Hgb 11.7, plt 259, LDH 185. No s/sx of bleeding or infusion issues. Will be on levofloxacin at discharge - will monitor closely given can prevent warfarin clearance.   Warfarin PTA: 3mg  MWF, 2mg  TTSS but INRs have been subtherapeutic outpatient.  Goal of Therapy:  INR 2- 2.5 long term  Monitor platelets by anticoagulation protocol: Yes   Plan:  Will order warfarin 2.5 mg daily at discharge (sent out on 2.5 mg tablets)- planned for INR check on Tuesday Monitor daily heparin level, INR, CBC,  and for s/sx of bleeding   Thank you for allowing pharmacy to participate in this patient's care,  Sherron Monday, PharmD, BCCCP Clinical Pharmacist  Phone: [phone] 11/13/2023 12:20 PM  Please check AMION for all [facility] Pharmacy phone numbers After 10:00 PM, call Main Pharmacy [phone]

## 2023-11-13 NOTE — Progress Notes (Signed)
[facility]  Date of Admission:  10/28/2023     Total days of antibiotics 16         ASSESSMENT:  Mr. Argent is POD #1 from Wound Vac removal and irrigation in the setting of polymicrobial LVAD driveline infection. Surgical specimens with gram positive cocci on gram stain and no growth on culture with expectations that if anything grows would be MSSA. Discussed recommendation to "rechallenge" cefazolin with previous rash likely related to adhesives. He is willing as long as there is something to help with itching if need be. Will plan on 6 weeks of treatment with Cefazolin and oral levofloxacin and minocycline through 12/24/23. PICC line in place in left upper extremity. Continue to monitor cultures for any new organisms. Will follow closely as outpatient. Home Health/OPAT orders below. Continue post-operative wound care and remaining medical and supportive care per Primary Team.   PLAN:  Rechallenge Cefazolin and continue minocycline and levofloxacin through 12/24/23 for 6 weeks of treatment.  Monitor cultures for any new organisms.  Close outpatient follow up. Post-operative wound care and remaining medical and supportive care per Primary Team.   Diagnosis: Polymicrobial LVAD driveline infection   Culture Result: MSSA, Enterobacter cloacae, Stenotrophomonas maltophillia  Allergies  Allergen Reactions   Chlorhexidine Dermatitis and Rash   Other Anaphylaxis    Tree Nuts   Peanuts [Peanut Oil] Anaphylaxis    OPAT Orders Discharge antibiotics to be given via PICC line Discharge antibiotics: Cefazolin and oral levofloxacin and minocycline  Per pharmacy protocol   Duration: 6 weeks  End Date: 12/24/23  [facility] Care Per Protocol:  Home health RN for IV administration and teaching; PICC line care and labs.    Labs weekly while on IV antibiotics: _X_ CBC with differential _X_ BMP __ CMP _X_ CRP _X_ ESR __ Vancomycin trough __ CK  __ Please pull PIC  at completion of IV antibiotics _X_ Please leave PIC in place until doctor has seen patient or been notified  Fax weekly labs to [phone]  Clinic Follow Up Appt:  11/25/23 at 4pm with Dr. Elinor Parkinson  I have personally spent 25 minutes involved in face-to-face and non-face-to-face activities for this patient on the day of the visit. Professional time spent includes the following activities: Preparing to see the patient (review of tests), Obtaining and/or reviewing separately obtained history (admission/discharge record), Performing a medically appropriate examination and/or evaluation , Ordering medications/tests/procedures, referring and communicating with other health care professionals, Documenting clinical information in the EMR, Independently interpreting results (not separately reported), Communicating results to the patient/family/caregiver, Counseling and educating the patient/family/caregiver and Care coordination (not separately reported).   Principal Problem:   Infection associated with driveline of left ventricular assist device (LVAD) (HCC)    buprenorphine-naloxone  1 tablet Sublingual Daily   digoxin  0.125 mg Oral Daily   gabapentin  300 mg Oral TID   ivabradine  5 mg Oral BID WC   levofloxacin  750 mg Oral Daily   losartan  25 mg Oral BID   minocycline  200 mg Oral BID   pantoprazole  40 mg Oral Daily   potassium chloride  20 mEq Oral Once   sertraline  25 mg Oral Daily   sodium chloride flush  10-40 mL Intracatheter Q12H   spironolactone  25 mg Oral Daily   warfarin  2.5 mg Oral q1600   Warfarin - Pharmacist Dosing Inpatient   Does not apply q1600   zinc sulfate (50mg  elemental zinc)  220 mg Oral Daily    SUBJECTIVE:  Afebrile overnight with no acute events.   Allergies  Allergen Reactions   Chlorhexidine Dermatitis and Rash   Other Anaphylaxis    Tree Nuts   Peanuts [Peanut Oil] Anaphylaxis     Review of Systems: Review of Systems  Constitutional:   Negative for chills, fever and weight loss.  Respiratory:  Negative for cough, shortness of breath and wheezing.   Cardiovascular:  Negative for chest pain and leg swelling.  Gastrointestinal:  Negative for abdominal pain, constipation, diarrhea, nausea and vomiting.  Skin:  Negative for rash.      OBJECTIVE: Vitals:   11/12/23 2318 11/13/23 0444 11/13/23 0529 11/13/23 1121  BP: (!) 99/58 109/76    Pulse: 73 (!) 101  (!) 103  Resp: 15 17 16    Temp: 98.4 F (36.9 C) 98.4 F (36.9 C)    TempSrc: Oral Oral    SpO2: 100% 100%    Weight:   66.8 kg   Height:       Body mass index is 23.07 kg/m.  Physical Exam Constitutional:      General: He is not in acute distress.    Appearance: He is well-developed.  Cardiovascular:     Rate and Rhythm: Normal rate and regular rhythm.     Comments: LVAD Hum Pulmonary:     Effort: Pulmonary effort is normal.     Breath sounds: Normal breath sounds.  Skin:    General: Skin is warm and dry.  Neurological:     Mental Status: He is alert and oriented to person, place, and time.  Psychiatric:        Behavior: Behavior normal.        Thought Content: Thought content normal.        Judgment: Judgment normal.     Lab Results Lab Results  Component Value Date   WBC 10.0 11/13/2023   HGB 11.7 (L) 11/13/2023   HCT 36.6 (L) 11/13/2023   MCV 84.3 11/13/2023   PLT 259 11/13/2023    Lab Results  Component Value Date   CREATININE 0.91 11/13/2023   BUN 17 11/13/2023   NA 137 11/13/2023   K 3.6 11/13/2023   CL 102 11/13/2023   CO2 27 11/13/2023    Lab Results  Component Value Date   ALT 8 10/28/2023   AST 16 10/28/2023   ALKPHOS 150 (H) 10/28/2023   BILITOT 0.5 10/28/2023     Microbiology: Recent Results (from the past 240 hour(s))  Aerobic/Anaerobic Culture w Gram Stain (surgical/deep wound)     Status: None (Preliminary result)   Collection Time: 11/07/23  3:33 PM   Specimen: Wound  Result Value Ref Range Status   Specimen  Description WOUND ABDOMEN  Final   Special Requests NONE  Final   Gram Stain   Final    FEW WBC PRESENT, PREDOMINANTLY PMN RARE GRAM POSITIVE COCCI IN PAIRS    Culture   Final    RARE ENTEROBACTER CLOACAE RARE STAPHYLOCOCCUS AUREUS RARE STENOTROPHOMONAS MALTOPHILIA Sent to [facility] for further susceptibility testing. NO ANAEROBES ISOLATED Performed at [facility], [address] [place], [place] [address]    Report Status PENDING  Incomplete   Organism ID, Bacteria ENTEROBACTER CLOACAE  Final   Organism ID, Bacteria STAPHYLOCOCCUS AUREUS  Final   Organism ID, Bacteria STENOTROPHOMONAS MALTOPHILIA  Final      Susceptibility   Enterobacter cloacae - MIC*    CEFEPIME <=0.12 SENSITIVE Sensitive  CEFTAZIDIME <=1 SENSITIVE Sensitive     CIPROFLOXACIN <=0.25 SENSITIVE Sensitive     GENTAMICIN <=1 SENSITIVE Sensitive     IMIPENEM 0.5 SENSITIVE Sensitive     TRIMETH/SULFA <=20 SENSITIVE Sensitive     PIP/TAZO <=4 SENSITIVE Sensitive ug/mL    * RARE ENTEROBACTER CLOACAE   Staphylococcus aureus - MIC*    CIPROFLOXACIN <=0.5 SENSITIVE Sensitive     ERYTHROMYCIN <=0.25 SENSITIVE Sensitive     GENTAMICIN <=0.5 SENSITIVE Sensitive     OXACILLIN 0.5 SENSITIVE Sensitive     TETRACYCLINE <=1 SENSITIVE Sensitive     VANCOMYCIN 1 SENSITIVE Sensitive     TRIMETH/SULFA <=10 SENSITIVE Sensitive     CLINDAMYCIN <=0.25 SENSITIVE Sensitive     RIFAMPIN <=0.5 SENSITIVE Sensitive     Inducible Clindamycin NEGATIVE Sensitive     LINEZOLID 2 SENSITIVE Sensitive     * RARE STAPHYLOCOCCUS AUREUS   Stenotrophomonas maltophilia - MIC*    LEVOFLOXACIN 0.5 SENSITIVE Sensitive     TRIMETH/SULFA 80 RESISTANT Resistant     * RARE STENOTROPHOMONAS MALTOPHILIA  Aerobic/Anaerobic Culture w Gram Stain (surgical/deep wound)     Status: None (Preliminary result)   Collection Time: 11/12/23  6:48 PM   Specimen: Soft Tissue, Other  Result Value Ref Range Status   Specimen Description WOUND  Final    Special Requests SWAB OF REMOVAL VAC FROM VAD  Final   Gram Stain NO WBC SEEN FEW GRAM POSITIVE COCCI IN CLUSTERS   Final   Culture   Final    NO GROWTH < 12 HOURS Performed at [facility], [address], [place], [place] [address]    Report Status PENDING  Incomplete     Marcos Eke, NP [facility]  11/13/2023  1:05 PM

## 2023-11-13 NOTE — TOC Transition Note (Signed)
Transition of Care [facility]) - CM/SW Discharge Note   Patient Details  Name: Joel York MRN: [id number] Date of Birth: 1986/10/14  Transition of Care [facility]) CM/SW Contact:  Harriet Masson, RN Phone Number: 11/13/2023, 3:22 PM   Clinical Narrative:    Patient stable to discharge home.  Pam with [employer] is aware of discharge. Patient will continue cefazolin q8hrs iv at home.  [facility] will provide home health RN. No other TOC needs at this time.   Final next level of care: Home w Home Health Services Barriers to Discharge: Barriers Resolved   Patient Goals and CMS Choice CMS Medicare.gov Compare Post Acute Care list provided to:: [name]  Discharge Placement                 home        Discharge Plan and Services Additional resources added to the After Visit Summary for     Discharge Planning Services: CM Consult Post Acute Care Choice: Home Health                    HH Arranged: RN [facility] Agency: [facility] Date HH Agency Contacted: 11/05/23 Time HH Agency Contacted: 1058 Representative spoke with at [facility] Agency: [facility], Pam RN  Social Determinants of Health (SDOH) Interventions SDOH Screenings   Food Insecurity: No Food Insecurity (10/28/2023)  Housing: Low Risk  (10/28/2023)  Transportation Needs: No Transportation Needs (10/28/2023)  Utilities: Not At Risk (10/28/2023)  Alcohol Screen: Low Risk  (07/14/2023)  Financial Resource Strain: Medium Risk (08/12/2023)  Physical Activity: Sufficiently Active (07/14/2023)  Stress: Stress Concern Present (07/14/2023)  Tobacco Use: High Risk (11/12/2023)     Readmission Risk Interventions    11/13/2023    3:21 PM  Readmission Risk Prevention Plan  Transportation Screening Complete  PCP or Specialist Appt within 3-5 Days Complete  HRI or Home Care Consult Complete  Social Work Consult for Recovery Care Planning/Counseling Complete  Palliative Care  Screening Not Applicable  Medication Review Oceanographer) Complete

## 2023-11-13 NOTE — Progress Notes (Signed)
PHARMACY CONSULT NOTE FOR:  OUTPATIENT  PARENTERAL ANTIBIOTIC THERAPY (OPAT)  Indication: Driveline infection  Regimen: Cefazolin 2 gm IV Q 8 hours + Levofloxacin 750 mg PO daily + Minocycline 200 mg PO BID  End date: 12/24/23  IV antibiotic discharge orders are pended. To discharging provider:  please sign these orders via discharge navigator,  Select New Orders & click on the button choice - Manage This Unsigned Work.     Thank you for allowing pharmacy to be a part of this patient's care.  Sharin Mons, PharmD, BCPS, BCIDP Infectious Diseases Clinical Pharmacist Phone: [phone] 11/13/2023, 12:41 PM

## 2023-11-13 NOTE — TOC Transition Note (Signed)
Transition of Care [facility]) - CM/SW Discharge Note   Patient Details  Name: Joel York MRN: [id number] Date of Birth: 11-14-1986  Transition of Care [facility]) CM/SW Contact:  Nicanor Bake Phone Number: [phone] 11/13/2023, 11:22 AM   Clinical Narrative:   HF CSW met with pt at bedside. Pt stated that his wife was providing his transportation home. CSW discussed hospital follow up appt. With PCP.       Barriers to Discharge: Continued Medical Work up   Patient Goals and CMS Choice CMS Medicare.gov Compare Post Acute Care list provided to:: [name]  Discharge Placement                         Discharge Plan and Services Additional resources added to the After Visit Summary for     Discharge Planning Services: CM Consult Post Acute Care Choice: Home Health                    HH Arranged: RN [facility] Agency: [facility] Date HH Agency Contacted: 11/05/23 Time HH Agency Contacted: 1058 Representative spoke with at [facility] Agency: [facility], Pam RN  Social Determinants of Health (SDOH) Interventions SDOH Screenings   Food Insecurity: No Food Insecurity (10/28/2023)  Housing: Low Risk  (10/28/2023)  Transportation Needs: No Transportation Needs (10/28/2023)  Utilities: Not At Risk (10/28/2023)  Alcohol Screen: Low Risk  (07/14/2023)  Financial Resource Strain: Medium Risk (08/12/2023)  Physical Activity: Sufficiently Active (07/14/2023)  Stress: Stress Concern Present (07/14/2023)  Tobacco Use: High Risk (11/12/2023)     Readmission Risk Interventions     No data to display

## 2023-11-13 NOTE — Transfer of Care (Signed)
Immediate Anesthesia Transfer of Care Note  Patient: Joel York  Procedure(s) Performed: REMOVAL OF WOUND VAC with application of myriad (Abdomen)  Patient Location: PACU  Anesthesia Type:MAC  Level of Consciousness: awake and alert   Airway & Oxygen Therapy: Patient Spontanous Breathing  Post-op Assessment: Report given to RN  Post vital signs: Reviewed and stable  Last Vitals:  Vitals Value Taken Time  BP 99/58 11/12/23 2327  Temp 36.9 C 11/12/23 2318  Pulse 73 11/12/23 2318  Resp 24 11/13/23 0401  SpO2 100 % 11/12/23 2318  Vitals shown include unfiled device data.  Last Pain:  Vitals:   11/13/23 0016  TempSrc:   PainSc: 7       Patients Stated Pain Goal: 0 (11/13/23 0016)  Complications: No notable events documented.

## 2023-11-13 NOTE — Progress Notes (Signed)
.Patient ID: Joel York, male   DOB: [birth year], 37 y.o.   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    11/06: CT C/A/P: small amount of periprosthetic fluid within the left pleural space adjacent to the proximal portion of the power cord.  11/7: Wound culture with S aureus and Enterobacter cloacae.  11/10: New PICC 11/12: Wound culture 11/1 MRSA corrected to MSSA. Dapto stopped. Continued IV cefepime. 11/15 OR debridement with wound vac change 11/20 OR for wound vac removal and wound washout  Went to OR yesterday for wound vac removal. And wash out.   Remains on IV abx. Still with pain at site. Anxious to go home.   VAD Interrogation: Flow 4.4 L/min, Speed 5600 RPM, Power 4.0  watts, PI 3.2  Objective:    Vital Signs:   Temp:  [97.7 F (36.5 C)-98.4 F (36.9 C)] 98.4 F (36.9 C) (11/21 0444) Pulse Rate:  [73-143] 101 (11/21 0444) Resp:  [9-17] 16 (11/21 0529) BP: (92-109)/(57-85) 109/76 (11/21 0444) SpO2:  [96 %-100 %] 100 % (11/21 0444) Weight:  [66.8 kg] 66.8 kg (11/21 0529) Last BM Date : 11/12/23 Mean arterial Pressure 70-80s  Intake/Output:   Intake/Output Summary (Last 24 hours) at 11/13/2023 1002 Last data filed at 11/13/2023 0444 Gross per 24 hour  Intake 1594.9 ml  Output --  Net 1594.9 ml    Physical Exam   General:  NAD.  HEENT: normal  Neck: supple. JVP not elevated.  Carotids 2+ bilat; no bruits. No lymphadenopathy or thryomegaly appreciated. Cor: LVAD hum.  Lungs: Clear. Abdomen: obese soft, nontender, non-distended. No hepatosplenomegaly. No bruits or masses. Good bowel sounds. Driveline site with dressing. Anchor in place.  Extremities: no cyanosis, clubbing, rash. Warm no edema  Neuro: alert & oriented x 3. No focal deficits. Moves all 4 without problem   Telemetry   SR 70-90s  (Personally reviewed)    Labs   Basic Metabolic Panel: Recent Labs  Lab 11/08/23 0354 11/10/23 0800 11/11/23 0500 11/12/23 0520 11/13/23 0400  NA 133* 138 137 137 137  K 4.3 3.8 3.8 3.7 3.6  CL 99 104 101 101 102  CO2 24 26 27 29 27   GLUCOSE 191* 110* 99 114* 98  BUN 25* 15 12 18 17   CREATININE 0.73 0.88 0.79 0.90 0.91  CALCIUM 9.0 9.0 8.9 8.9 8.9    Liver Function Tests: No results for input(s): "AST", "ALT", "ALKPHOS", "BILITOT", "PROT", "ALBUMIN" in the last 168 hours.  No results for input(s): "LIPASE", "AMYLASE" in the last 168 hours. No results for input(s): "AMMONIA" in the last 168 hours.  CBC: Recent Labs  Lab 11/07/23 0512 11/10/23 0800 11/11/23 0500 11/12/23 0520 11/13/23 0400  WBC 6.3 7.9 6.4 8.6 10.0  HGB 12.2* 11.9* 11.8* 11.7* 11.7*  HCT 38.6* 37.7* 37.3* 36.6* 36.6*  MCV 84.5 87.1 86.1 83.8 84.3  PLT 273 301 270 251 259    INR: Recent Labs  Lab 11/09/23 0524 11/10/23 0800 11/11/23 0500 11/12/23 0520 11/13/23 0400  INR 1.6* 1.7* 1.7* 2.1* 2.0*      Imaging   No results found.   Medications:     Scheduled Medications:  buprenorphine-naloxone  1 tablet Sublingual Daily   digoxin  0.125 mg Oral Daily   gabapentin  300 mg Oral TID   ivabradine  5 mg Oral  BID WC   levofloxacin  750 mg Oral Daily   losartan  25 mg Oral BID   minocycline  200 mg Oral BID   pantoprazole  40 mg Oral Daily   sertraline  25 mg Oral Daily   sodium chloride flush  10-40 mL Intracatheter Q12H   spironolactone  25 mg Oral Daily   warfarin  1 mg Oral q1600   Warfarin - Pharmacist Dosing Inpatient   Does not apply q1600   zinc sulfate (50mg  elemental zinc)  220 mg Oral Daily    Infusions:  dextrose 5% lactated ringers 100 mL/hr at 11/12/23 2331   nafcillin 12 g in sodium chloride 0.9 % 500 mL continuous infusion 20.8 mL/hr at 11/12/23 2331    PRN Medications: acetaminophen, diphenhydrAMINE, HYDROmorphone (DILAUDID) injection, HYDROmorphone, mouth  rinse, sodium chloride flush, traZODone   Patient Profile   Joel York is a 37 yo male with PMH of 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 Now presenting with driveline and PICC line infection.   Assessment/Plan:   1. 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 Went for I&D/wound vac change x3. Discharged home with 6 weeks of IV abx.  - Now re-admitted with persistent driveline infection.  - Wound culture from 11/1 grew MRSA. Corrected to MSSA on 11/12. Dapto stopped. - CT C/A/P 11/06 w/ small amount of periprosthetic fluid within the left pleural space adjacent to the proximal portion of the power cord.  - Wound culture from 11/7 with S. aureus and Enterobacter cloacae - s/p repeat debridement and wound vac change 11/15 - s/p OR for wound vac removal and washout 11/21 - d/w Dr. Donata Clay. Ok for d/c today with home wound management with VASHE. Will d/w ID regarding plan for home abx   2. PICC line infection - PICC pulled in clinic 10/27/23 - PICC replaced 11/02/23 - site ok   3. 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 Echo EF <20%,  RV mildly reduced, - Etiology uncertain. ? 2/2 PVCs vs genetically mediated +/- hypertension.  - LHC: normal coronaries.  - cMRI:  LV markedly dilated EF 10% RV 17% NICM - Underwent HM-III VAD on 07/28/25. Stable NYHA I-II - INR 2.0 goal INR Discussed warfarin dosing with PharmD personally. - Continue digoxin 0.125 mg daily - Continue Losartan 25 mg BID (dose reduced 11/12 d/t soft MAP) - Continue spiro 25 mg daily - Continue Ivabradine 5 BID   4. 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 Hx drug abuse/Tobacco abuse - reports quit smoking 7/24, cotinine level at 30.1 09/22/23 - Now on subaxone with oral and IV dilaudid - Narcotic requirements/request far out of proportion to  typical DL wounds. Suspect significant drug-seeking behavior  - Will taper dilaudid regimen. Long-term will need Pain Clinic to manage   Length of Stay: 16  [name], MD 11/13/2023, 10:02 AM  VAD Team --- VAD ISSUES ONLY--- Pager [phone] (7am - 7am)  Advanced Heart Failure Team  Pager [phone] (M-F; 7a - 5p)  Please contact [facility] for night-coverage after hours (5p -7a ) and weekends on [email or web address]

## 2023-11-13 NOTE — Plan of Care (Signed)
  Problem: Education: Goal: Patient will understand all VAD equipment and how it functions Outcome: Progressing Goal: Patient will be able to verbalize current INR target range and antiplatelet therapy for discharge home Outcome: Progressing   Problem: Cardiac: Goal: LVAD will function as expected and patient will experience no clinical alarms Outcome: Progressing   Problem: Education: Goal: Knowledge of General Education information will improve Description: Including pain rating scale, medication(s)/side effects and non-pharmacologic comfort measures Outcome: Progressing   Problem: Health Behavior/Discharge Planning: Goal: Ability to manage health-related needs will improve Outcome: Progressing   Problem: Clinical Measurements: Goal: Ability to maintain clinical measurements within normal limits will improve Outcome: Progressing Goal: Respiratory complications will improve Outcome: Progressing Goal: Cardiovascular complication will be avoided Outcome: Progressing   Problem: Activity: Goal: Risk for activity intolerance will decrease Outcome: Progressing   Problem: Nutrition: Goal: Adequate nutrition will be maintained Outcome: Progressing   Problem: Coping: Goal: Level of anxiety will decrease Outcome: Progressing   Problem: Elimination: Goal: Will not experience complications related to bowel motility Outcome: Progressing Goal: Will not experience complications related to urinary retention Outcome: Progressing   Problem: Pain Management: Goal: General experience of comfort will improve Outcome: Progressing   Problem: Safety: Goal: Ability to remain free from injury will improve Outcome: Progressing   Problem: Skin Integrity: Goal: Risk for impaired skin integrity will decrease Outcome: Progressing

## 2023-11-13 NOTE — Progress Notes (Signed)
LVAD Coordinator Rounding Note:  Admitted 10/28/23 to heart failure service due to worsening driveline infection. Pt seen in VAD clinic 11/4- wound looked progressively worse. Decision was made to admit for further IV antibiotics and debridement.   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  Pt laying in bed. Bedside RN administering PRN pain medication prior to dressing change with Dr Donata Clay. States he is ready to go home.   ID following. Cultures positive for Staph Aureus and Enterobacter Cloacae and now RARE STENOTROPHOMONAS MALTOPHILIA as of 11/07/23. Currently receiving Nafcillin 12gr every 24 hrs. Discharge antibiotic plan per ID team: Continue PO Levaquin for Enterobacter and Stenotrophomonas, add PO Minocycline for double coverage of Stenotrophomonas, and  IV Cefazolin to cover MSSA. Antibiotic end date: 12/24/23 (6 weeks). ID f/u scheduled 12/3 at 4 PM.   VAD clinic follow up appt scheduled Tuesday 11/19/23 at 9 AM.   Vital signs: Temp: 98.4 HR: 98 Doppler Pressure: 82 Auto BP: 109/76 (88) O2 Sat: 100% on RA Wt:141.1>141.5>145.9>150.2>154>152.6>151>148.5>147.3>144.4>143.7>147.4>147.9>146>147.3 lbs    LVAD interrogation reveals:  Speed: 5600 Flow: 3.7 Power: 4.2 w PI: 4.9  Alarms: on batteries Events: on batteries Hematocrit: 36  Fixed speed: 5600 Low speed limit: 5300  Drive Line: Existing VAD dressing removed and site care performed using sterile technique by Dr. Donata Clay and Penni Bombard. Vashe moistened 4 x 4 used to lightly debride/cleanse wound bed. Myriad product left in wound bed. Skin surrounding wound bed cleansed with betadine swab x 2. 1 VASHE moistened 4 x 4 packed into wound bed, covered with an additional VASHE moistened 4 x 4 on top. Covered with dry sterile gauze and 2 large tegaderms. Cathgrip anchor re-applied. Sutures in place along previous incision site cleansed with VASHE. Slight redness noted along lower abdomen where drainage had seeped onto  his skin. Tenderness noted with cleansing. Moderate amount of serosanguinous drainage noted on previous dressing. Continue daily VASHE wet to dry dressing changes per Dr Donata Clay. Provided with 7 daily kits and 7 cath grip anchors for home use. Per Penni Bombard they have enough of other needed dressing supplies at home. Next dressing change due 11/14/23.      Labs:  LDH trend: 133>134>124>146>145>132>160>185>  INR trend: 2.4>1.7>1.3>1.5>1.4>1.5>1.2>1.3>1.5>1.7>2.1>2.0  WBC trend: 6.2>5.6>7.6>7.2>8.0>5.7>5.6>6.8>7.6>6.3>7.9>6.4>8.6>10.0  Anticoagulation Plan: -INR Goal: 2-2.5 -ASA Dose: off  Blood Products:  10/29/23>> 2 FFP  Infection:  10/24/23>>wound cx>>STAPH AUREUS; final 10/29/23>>blood cultures>> no growth 5 days 10/30/23>> wound cx>>RARE STAPHYLOCOCCUS AUREUS  RARE ENTEROBACTER CLOACAE; final  11/07/23>>driveline culture OR>>RARE ENTEROBACTER CLOACAE  RARE STAPHYLOCOCCUS AUREUS  RARE STENOTROPHOMONAS MALTOPHILIA   Drips:  Heparin 500 units/hr-off INR 2.1  Adverse Events on VAD: - Admitted 09/29/23 with DL infection. Wound cx + staph aureus. Underwent I&D/wound vac change x3. ID saw, discharged home on 6 weeks of IV cefazolin (end date 11/18/23).   Plan/Recommendations:  1. Please page VAD coordinator for any alarms or VAD equipment issues. 2. Continue daily wet to dry VASHE dressing changes   Alyce Pagan RN VAD Coordinator  Office: [phone]  24/7 Pager: [phone]

## 2023-11-13 NOTE — Anesthesia Postprocedure Evaluation (Signed)
Anesthesia Post Note  Patient: Joel York  Procedure(s) Performed: REMOVAL OF WOUND VAC with application of myriad (Abdomen)     Patient location during evaluation: PACU Anesthesia Type: MAC Level of consciousness: awake and alert Pain management: pain level controlled Vital Signs Assessment: post-procedure vital signs reviewed and stable Respiratory status: spontaneous breathing, nonlabored ventilation, respiratory function stable and patient connected to nasal cannula oxygen Cardiovascular status: stable and blood pressure returned to baseline Postop Assessment: no apparent nausea or vomiting Anesthetic complications: no   No notable events documented.  Last Vitals:  Vitals:   11/12/23 1957 11/12/23 2318  BP: 95/72 (!) 99/58  Pulse: 74 73  Resp: 14 15  Temp: 36.7 C 36.9 C  SpO2: 100% 100%    Last Pain:  Vitals:   11/13/23 0016  TempSrc:   PainSc: 7    Pain Goal: Patients Stated Pain Goal: 0 (11/13/23 0016)                 Kennieth Rad

## 2023-11-15 LAB — AEROBIC/ANAEROBIC CULTURE W GRAM STAIN (SURGICAL/DEEP WOUND)

## 2023-11-15 LAB — MISC LABCORP TEST (SEND OUT): Labcorp test code: 96388

## 2023-11-15 LAB — ACID FAST CULTURE WITH REFLEXED SENSITIVITIES (MYCOBACTERIA): Acid Fast Culture: NEGATIVE

## 2023-11-17 ENCOUNTER — Other Ambulatory Visit (HOSPITAL_COMMUNITY): Payer: Self-pay | Admitting: Unknown Physician Specialty

## 2023-11-17 ENCOUNTER — Other Ambulatory Visit (HOSPITAL_COMMUNITY): Payer: Self-pay | Admitting: Pharmacist

## 2023-11-17 DIAGNOSIS — Z7901 Long term (current) use of anticoagulants: Secondary | ICD-10-CM

## 2023-11-17 DIAGNOSIS — T827XXA Infection and inflammatory reaction due to other cardiac and vascular devices, implants and grafts, initial encounter: Secondary | ICD-10-CM

## 2023-11-17 DIAGNOSIS — Z95811 Presence of heart assist device: Secondary | ICD-10-CM

## 2023-11-18 ENCOUNTER — Ambulatory Visit (HOSPITAL_COMMUNITY)
Admit: 2023-11-18 | Discharge: 2023-11-18 | Disposition: A | Discharge status: Home / Self Care | Payer: Medicaid Other | Attending: Internal Medicine | Admitting: Internal Medicine

## 2023-11-18 ENCOUNTER — Ambulatory Visit (HOSPITAL_COMMUNITY): Payer: Self-pay | Admitting: Pharmacist

## 2023-11-18 ENCOUNTER — Other Ambulatory Visit: Payer: Self-pay | Admitting: Cardiothoracic Surgery

## 2023-11-18 VITALS — BP 88/0 | HR 92 | Ht 67.0 in | Wt 153.8 lb

## 2023-11-18 DIAGNOSIS — I110 Hypertensive heart disease with heart failure: Secondary | ICD-10-CM | POA: Diagnosis not present

## 2023-11-18 DIAGNOSIS — Z95811 Presence of heart assist device: Secondary | ICD-10-CM | POA: Diagnosis not present

## 2023-11-18 DIAGNOSIS — Z792 Long term (current) use of antibiotics: Secondary | ICD-10-CM | POA: Insufficient documentation

## 2023-11-18 DIAGNOSIS — Z87891 Personal history of nicotine dependence: Secondary | ICD-10-CM | POA: Insufficient documentation

## 2023-11-18 DIAGNOSIS — F1120 Opioid dependence, uncomplicated: Secondary | ICD-10-CM

## 2023-11-18 DIAGNOSIS — Z79899 Other long term (current) drug therapy: Secondary | ICD-10-CM | POA: Insufficient documentation

## 2023-11-18 DIAGNOSIS — I5022 Chronic systolic (congestive) heart failure: Secondary | ICD-10-CM | POA: Insufficient documentation

## 2023-11-18 DIAGNOSIS — Z7901 Long term (current) use of anticoagulants: Secondary | ICD-10-CM | POA: Diagnosis not present

## 2023-11-18 DIAGNOSIS — T827XXA Infection and inflammatory reaction due to other cardiac and vascular devices, implants and grafts, initial encounter: Secondary | ICD-10-CM | POA: Diagnosis not present

## 2023-11-18 DIAGNOSIS — I5082 Biventricular heart failure: Secondary | ICD-10-CM | POA: Insufficient documentation

## 2023-11-18 DIAGNOSIS — I428 Other cardiomyopathies: Secondary | ICD-10-CM | POA: Insufficient documentation

## 2023-11-18 DIAGNOSIS — I493 Ventricular premature depolarization: Secondary | ICD-10-CM | POA: Insufficient documentation

## 2023-11-18 LAB — CBC
HCT: 43.8 % (ref 39.0–52.0)
Hemoglobin: 13.9 g/dL (ref 13.0–17.0)
MCH: 26.4 pg (ref 26.0–34.0)
MCHC: 31.7 g/dL (ref 30.0–36.0)
MCV: 83.3 fL (ref 80.0–100.0)
Platelets: 283 10*3/uL (ref 150–400)
RBC: 5.26 MIL/uL (ref 4.22–5.81)
RDW: 14.1 % (ref 11.5–15.5)
WBC: 8.8 10*3/uL (ref 4.0–10.5)
nRBC: 0 % (ref 0.0–0.2)

## 2023-11-18 LAB — COMPREHENSIVE METABOLIC PANEL
ALT: 11 U/L (ref 0–44)
AST: 21 U/L (ref 15–41)
Albumin: 4 g/dL (ref 3.5–5.0)
Alkaline Phosphatase: 152 U/L — ABNORMAL HIGH (ref 38–126)
Anion gap: 11 (ref 5–15)
BUN: 20 mg/dL (ref 6–20)
CO2: 26 mmol/L (ref 22–32)
Calcium: 9.6 mg/dL (ref 8.9–10.3)
Chloride: 99 mmol/L (ref 98–111)
Creatinine, Ser: 0.95 mg/dL (ref 0.61–1.24)
GFR, Estimated: >60 mL/min (ref >60)
Glucose, Bld: 119 mg/dL — ABNORMAL HIGH (ref 70–99)
Potassium: 3.9 mmol/L (ref 3.5–5.1)
Sodium: 136 mmol/L (ref 135–145)
Total Bilirubin: 0.5 mg/dL (ref <1.2)
Total Protein: 8 g/dL (ref 6.5–8.1)

## 2023-11-18 LAB — PREALBUMIN: Prealbumin: 26 mg/dL (ref 18–38)

## 2023-11-18 LAB — PROTIME-INR
INR: 1.2 (ref 0.8–1.2)
Prothrombin Time: 15 s (ref 11.4–15.2)

## 2023-11-18 LAB — LACTATE DEHYDROGENASE: LDH: 244 U/L — ABNORMAL HIGH (ref 98–192)

## 2023-11-18 MED ORDER — CETIRIZINE HCL 10 MG PO TABS: 10.0000 mg | ORAL_TABLET | Freq: Every day | ORAL | 6 refills | Status: AC

## 2023-11-18 MED ORDER — TRIAMCINOLONE ACETONIDE 0.1 % EX CREA: 1.0000 | TOPICAL_CREAM | Freq: Two times a day (BID) | CUTANEOUS | 3 refills | Status: AC

## 2023-11-18 MED ORDER — ENOXAPARIN SODIUM 40 MG/0.4ML IJ SOSY: 40.0000 mg | PREFILLED_SYRINGE | Freq: Two times a day (BID) | INTRAMUSCULAR | 0 refills | Status: DC

## 2023-11-18 MED ORDER — WARFARIN SODIUM 2.5 MG PO TABS: ORAL_TABLET | ORAL | 5 refills | Status: DC

## 2023-11-18 MED ORDER — HYDROMORPHONE HCL 4 MG PO TABS: 4.0000 mg | ORAL_TABLET | ORAL | 0 refills | Status: DC | PRN

## 2023-11-18 NOTE — Progress Notes (Addendum)
Patient presents for hosp follow up in VAD Clinic today with his wife Joel York. Reports no problems with VAD equipment or concerns with drive line.  Pt tells me that he has been doing well. Denies lightheadedness, dizziness, falls, shortness of breath, and signs of bleeding. Pt states that he is still having pain. He states he only has 5 pain pills left. Dr Maren Beach refilled pain meds today giving pt 5 more Dilaudid pills. Pt was instructed to only take Dilaudid prior to dressing changes.  Pts wife Joel York has been doing his driveline dressing change daily since hospital d/c. See below for full wound note.   Pt is currently on Cefazolin 2gr every 8 hrs, Minocycline and Levaquin. Pt confirms that he is taking these meds. IV antibiotics will end 12/24/23. Pt has f/u with ID on 12/3.  Will change Kenalog ointment to cream. New script sent to his pharmacy.   Vital Signs:  Doppler Pressure: 88   Automatc BP: 94/80 (85) HR: 92 SPO2: 100 % RA   Weight: 153.8 lb w/ eqt Last weight: 147 lb d/c weight   VAD Indication: Destination Therapy due to recent smoking    VAD interrogation & Equipment Management: Speed: 5600 Flow: 4.2 Power: 4.4 w    PI: 4.9   Alarms: none Events: 10 - 20 daily  Fixed speed 5600 Low speed limit: 5300   Primary Controller:  Replace back up battery in 24 months. Back up controller:   Replace back up battery in 25 months.   Annual Equipment Maintenance on UBC/PM was performed on 07/30/23.    I reviewed the LVAD parameters from today and compared the results to the patient's prior recorded data. LVAD interrogation was NEGATIVE for significant power changes, NEGATIVE for clinical alarms and STABLE for PI events/speed drops. No programming changes were made and pump is functioning within specified parameters. Pt is performing daily controller and system monitor self tests along with completing weekly and monthly maintenance for LVAD equipment.   LVAD equipment check  completed and is in good working order. Back-up equipment present. Charged back up battery and performed self-test on equipment.    Exit Site Care: Drive line is being maintained daily by pt's wife Joel York.  Existing VAD dressing removed and site care performed using sterile technique. Outer edges of wound cleansed with betadine x 2, Vashe soaked 2 x 2 used to lightly debride wound bed, all sutures removed in incision, and gauze dressing with 2 VASHE moistened 4 x 4 placed directly on wound bed. Covered with 2 large tegaderms instead of tape. Site does not tunnel. The velour is significantly exposed at the exit site. Moderate amount of serosanguinous drainage noted on previous dressing. Rash still present under previous tape sites. Kendall applying kenalog ointment but tells me this is oily. Changed to the cream per Tonye Becket NP she also suggested pt take daily zyrtec - pt tells me that he is already taking zyrtec daily. I have asked him to continue. Cath grip anchor applied for increased stabilization of drive line.  Pt denies fever or chills.  Provided with 7 daily kits and 10 cath grip anchors.        Significant Events on VAD Support:    Device: n/a    BP & Labs:  MAP 88 - Doppler is reflecting MAP   Hgb 13.9 - No S/S of bleeding. Specifically denies melena/BRBPR or nosebleeds.   LDH stable at 244 with established baseline of 200-348. Denies tea-colored urine. No power elevations  noted on interrogation.   3 mo. Intermacs follow up completed including:  Quality of Life, KCCQ-12, and Neurocognitive trail making.   Pt completed 1500 feet during 6 minute walk.  Patient Goals: Heal from driveline infection Be listed for heart transplant   Plan: Coumadin dosing per Lauren PharmD- Continue Zrytec 10 mg daily to aid with rash Cleanse exit site daily as instructed by Dr Maren Beach Return to VAD clinic in 1 week for wound check Return to VAD clinic in 2 mo for follow up with Dr  Gala Romney  Carlton Adam RN VAD Coordinator  Office: [phone]  24/7 Pager: [phone]

## 2023-11-18 NOTE — Patient Instructions (Signed)
Coumadin dosing per Lauren PharmD- Cleanse exit site with VASHE solution moistened 2 x 2s. Do not use CHG swabs. Cover dressing with 4 x 4s  Return to VAD clinic in 1 week for wound check Return to VAD clinic in 2 mo for follow up with Dr Gala Romney

## 2023-11-18 NOTE — Progress Notes (Signed)
LVAD INR

## 2023-11-18 NOTE — Progress Notes (Signed)
LVAD CLINIC NOTE  PCP: No primary care provider on file. HF Cardiologist: DB  HPI:  Joel York is a 37 y.o. male with HTN, ADD, asthma, hx drug abuse (now on suboxone x5 yrs), and seizures. Admitted in 7/27 with acute systolic heart failure -> shock. EF 10%. 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   - LHC with normal coronaries. RHC 07/24: Low CO (Fick CO/CI 3.3/1.9) and PAPi 3.0 on milrinone 0.375 . Off NE - cMRI:  LV markedly dilated EF 10% RV 17% NICM  Admitted 09/29/23 with DL infection. Wound cx + staph aureus. Underwent I&D/wound vac change x3. ID saw, discharged home on 6 weeks of IV cefazolin (end date 11/18/23).   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 Underwent repeat I&D. Wound culture which showed stenotrophomas, staph aureus, and entercoccus cloacea.   Presents to VAD Clinic for f/u. Continue to complain of site pain. Remains on IV abx at home. No fevers or chills. Denies orthopnea or PND. No bleeding, melena or neuro symptoms. No VAD alarms. Taking all meds as prescribed.    VAD Indication: Destination Therapy due to recent smoking    VAD interrogation & Equipment Management: Speed: 5600 Flow: 4.2 Power: 4.4 w    PI: 4.9   Alarms: none Events: 10 - 20 daily  Fixed speed 5600 Low speed limit: 5300   Primary Controller:  Replace back up battery in 24 months. Back up controller:   Replace back up battery in 25 months.   Annual Equipment Maintenance on UBC/PM was performed on 07/30/23.    I reviewed the LVAD parameters from today and compared the results to the patient's prior recorded data. LVAD interrogation was NEGATIVE for significant power changes, NEGATIVE for clinical alarms and STABLE for PI events/speed drops. No programming changes were made and pump is functioning within specified parameters. Pt is performing daily controller and system monitor self tests along with completing weekly and monthly maintenance for LVAD equipment.   LVAD equipment  check completed and is in good working order. Back-up equipment present. Charged back up battery and performed self-test on equipment.   Past Medical History:  Diagnosis Date   Acid reflux    ADHD (attention deficit hyperactivity disorder)    Asthma    Back pain    LVAD (left ventricular assist device) present (HCC)    Seizures (HCC)    Resolved    Current Outpatient Medications  Medication Sig Dispense Refill   albuterol (VENTOLIN HFA) 108 (90 Base) MCG/ACT inhaler Inhale 2 puffs into the lungs every 6 (six) hours as needed for wheezing or shortness of breath. Shortness of breath 1 each 6   ceFAZolin (ANCEF) IVPB Inject 2 g into the vein every 8 (eight) hours. Indication:  MSSA LVAD DLI First Dose: Yes Last Day of Therapy:  12/24/23 Labs - Once weekly:  CBC/D and BMP, Labs - Once weekly: ESR and CRP Method of administration: IV Push Method of administration may be changed at the discretion of home infusion pharmacist based upon assessment of the patient and/or caregiver's ability to self-administer the medication ordered. 123 Units 0   digoxin (LANOXIN) 0.125 MG tablet Take 1 tablet (0.125 mg total) by mouth daily. 30 tablet 6   gabapentin (NEURONTIN) 300 MG capsule Take 1 capsule (300 mg total) by mouth 3 (three) times daily. 90 capsule 6   HYDROmorphone (DILAUDID) 4 MG tablet Take 1 tablet (4 mg total) by mouth every 8 (eight) hours as needed for 3 days, THEN  1 tablet (4 mg total) every 12 (twelve) hours as needed for 3 days, THEN 1 tablet (4 mg total) daily as needed for up to 4 days. 19 tablet 0   hydrOXYzine (ATARAX) 25 MG tablet Take 1 tablet (25 mg total) by mouth 3 (three) times daily as needed for itching. 30 tablet 0   ivabradine (CORLANOR) 5 MG TABS tablet Take 1 tablet (5 mg total) by mouth 2 (two) times daily with a meal. 60 tablet 11   levofloxacin (LEVAQUIN) 750 MG tablet Take 1 tablet (750 mg total) by mouth daily. 40 tablet 0   losartan (COZAAR) 25 MG tablet Take 1 tablet  (25 mg total) by mouth 2 (two) times daily. 60 tablet 6   minocycline (MINOCIN) 100 MG capsule Take 2 capsules (200 mg total) by mouth 2 (two) times daily. 164 capsule 0   pantoprazole (PROTONIX) 40 MG tablet Take 1 tablet (40 mg total) by mouth daily. 30 tablet 6   sertraline (ZOLOFT) 25 MG tablet Take 1 tablet (25 mg total) by mouth daily. 30 tablet 6   spironolactone (ALDACTONE) 25 MG tablet Take 1 tablet (25 mg total) by mouth daily. 30 tablet 5   SUBOXONE 8-2 MG FILM Place 1 Film under the tongue daily.     traZODone (DESYREL) 50 MG tablet Take 1 tablet (50 mg total) by mouth at bedtime as needed for sleep. 30 tablet 6   triamcinolone ointment (KENALOG) 0.5 % Apply 1 Application topically 2 (two) times daily. 30 g 0   warfarin (COUMADIN) 2.5 MG tablet Take 1 tablet (2.5 mg daily) or as directed by coumadin clinic. 30 tablet 5   Zinc Sulfate 220 (50 Zn) MG TABS Take 1 tablet (220 mg total) by mouth daily. 30 tablet 5   No current facility-administered medications for this encounter.    Vital Signs:  Doppler Pressure: 88               Automatc BP: 94/80 (85) HR: 92 SPO2: 100 % RA   Weight: 153.8 lb w/ eqt Last weight: 147 lb d/c weight     Physical Exam: General:  NAD.  HEENT: normal  Neck: supple. JVP not elevated.  Carotids 2+ bilat; no bruits. No lymphadenopathy or thryomegaly appreciated. Cor: LVAD hum.  Lungs: Clear. Abdomen: soft, nontender, non-distended. No hepatosplenomegaly. No bruits or masses. Good bowel sounds. Driveline site with dressing. Rash from tape surrouding site. Anchor in place.  Extremities: no cyanosis, clubbing. Warm no edema  Neuro: alert & oriented x 3. No focal deficits. 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 Underwent repeat I&D. Wound culture which showed stenotrophomas, staph aureus, and entercoccus cloacea.  - seen by Dr. Donata Clay today - currently on  Cefazolin 2gr every 8 hrs, Minocycline and Levaquin. Pt confirms that he is taking these meds. IV antibiotics will end 12/24/23. Pt has f/u with ID on 12/3.   2. Chronic biventricular systolic heart failure - Admitted 7/24 NYHA IV symptoms .Echo EF <20%,  RV mildly reduced, - Etiology uncertain. ? 2/2 PVCs vs genetically mediated +/- hypertension.  - LHC with normal coronaries.  - cMRI:  LV markedly dilated EF 10% RV 17% NICM - Underwent HM-III VAD on 07/28/25 - Stable NYHA I-II - Volume ok  - Continue digoxin 0.125 mg daily - Continue losartan 25 mg BID - Continue ivabradine 5 bid - HR improved  3. S/P HMIII LVAD  on 07/29/23 - VAD interrogated personally. Parameters stable. - MAPs ok - LDH 185 - Hgb 11.8 - INR 1.5 Goal 2.0-3.0  Discussed warfarin dosing with PharmD personally. - Off ASA - DL site management as above   4. PVCs - Suppressed with Mexiletine 200 mg BID - likely can stop soon   5. Tobacco use - quit smoking in 7/24 but cotinine levels +   6. Hx drug abuse - Continue suboxone - no change - he continues to request pain medications. Reported pain far outside range from what we see for DL site issues. Concern for worsening narcotic seeking behavior. - He received a limited prescription of dilaudid from Dr. Donata Clay. - Pain clinic previously unwilling to manage. I will discuss with them. I am unable to manage his chronic pain particularly in setting of his narcotic addiction    I spent a total of 45 minutes today: 1) reviewing the patient's medical records including previous charts, labs and recent notes from other providers; 2) examining the patient and counseling them on their medical issues/explaining the plan of care; 3) adjusting meds as needed and 4) ordering lab work or other needed tests.    [name], MD  9:56 AM

## 2023-11-18 NOTE — Telephone Encounter (Signed)
Surgical Wound care note    I observed the patients large abdominal wound in the VAD clinic today.  Patient requests oral pain medication to cover surgical wound packing Of the LVAD tunnel wound in the mid abdominal wall. I ordered 5 more Dilaudid tablets, one for each of the next 5 daily wound care events.His care giver will distribute the medication about 30 minutes before each session.  Kathlee Nations trigt MD

## 2023-11-19 ENCOUNTER — Other Ambulatory Visit (HOSPITAL_COMMUNITY): Payer: Self-pay | Admitting: Unknown Physician Specialty

## 2023-11-19 DIAGNOSIS — Z7901 Long term (current) use of anticoagulants: Secondary | ICD-10-CM

## 2023-11-19 DIAGNOSIS — Z95811 Presence of heart assist device: Secondary | ICD-10-CM

## 2023-11-19 LAB — AEROBIC/ANAEROBIC CULTURE W GRAM STAIN (SURGICAL/DEEP WOUND): Gram Stain: NONE SEEN

## 2023-11-21 DIAGNOSIS — A4901 Methicillin susceptible Staphylococcus aureus infection, unspecified site: Secondary | ICD-10-CM | POA: Diagnosis not present

## 2023-11-21 DIAGNOSIS — T827XXA Infection and inflammatory reaction due to other cardiac and vascular devices, implants and grafts, initial encounter: Secondary | ICD-10-CM | POA: Diagnosis not present

## 2023-11-24 ENCOUNTER — Other Ambulatory Visit: Payer: Self-pay

## 2023-11-24 ENCOUNTER — Ambulatory Visit (HOSPITAL_COMMUNITY)
Admission: RE | Admit: 2023-11-24 | Discharge: 2023-11-24 | Disposition: A | Discharge status: Home / Self Care | Payer: Medicaid Other | Source: Ambulatory Visit | Attending: Cardiology | Admitting: Cardiology

## 2023-11-24 ENCOUNTER — Ambulatory Visit (HOSPITAL_COMMUNITY): Payer: Self-pay | Admitting: Pharmacist

## 2023-11-24 DIAGNOSIS — Z7901 Long term (current) use of anticoagulants: Secondary | ICD-10-CM | POA: Insufficient documentation

## 2023-11-24 DIAGNOSIS — Z95811 Presence of heart assist device: Secondary | ICD-10-CM | POA: Diagnosis not present

## 2023-11-24 LAB — PROTIME-INR
INR: 1.3 — ABNORMAL HIGH (ref 0.8–1.2)
Prothrombin Time: 16 s — ABNORMAL HIGH (ref 11.4–15.2)

## 2023-11-24 NOTE — Progress Notes (Signed)
LVAD INR 

## 2023-11-24 NOTE — Progress Notes (Signed)
Patient presents for dressing change with Dr Donata Clay in VAD Clinic today with his wife Penni Bombard. Reports no problems with VAD equipment or concerns with drive line.  Pt tells me that he has been doing well. Denies lightheadedness, dizziness, falls, shortness of breath, and signs of bleeding. Pt states that his pain is much better. He tells me that he has not taken any pain pills for the last 4 days.  Pts wife Fatima Blank has been doing his driveline dressing change daily since hospital d/c. See below for full wound note.   Pt is currently on Cefazolin 2gr every 8 hrs, Minocycline and Levaquin. Pt confirms that he is taking these meds. IV antibiotics will end 12/24/23. Pt has f/u with ID on 12/3.  Change Kenalog ointment to cream last week. This made a big difference in his rash and itching. There was no visible rash on his abdomen today.      Exit Site Care: Drive line is being maintained daily by pt's wife Kendal.  Existing VAD dressing removed and site care performed using sterile technique by Dr Maren Beach. Outer edges of wound cleansed with betadine x 2, Vashe soaked 2 x 2 used to lightly debride wound bed, and gauze dressing with 2 VASHE moistened 4 x 4 placed directly on wound bed. Covered with 2 large tegaderms instead of tape. Site does not tunnel. The velour is significantly exposed at the exit site. Moderate amount of serosanguinous drainage noted on previous dressing with some slimy yellow drainage. Anchor applied for increased stabilization of drive line over tegaderm.  Pt denies fever or chills. Pt has sufficient dressing supplies at home.      PICC line dressing changed today. Outer dressing removed using sterile techinique. Cleaned with betadine x 2 and allowed to dry. 1 suture intact. Covered with sorbaview dressing.  Plan: Coumadin dosing per Lauren PharmD- Cleanse exit site daily as instructed by Dr Maren Beach Return to VAD clinic in 1 week for wound check Return to VAD clinic in 2  mo for follow up with Dr Gala Romney  Carlton Adam RN VAD Coordinator  Office: [phone]  24/7 Pager: [phone]

## 2023-11-25 ENCOUNTER — Inpatient Hospital Stay: Payer: Medicaid Other | Admitting: Infectious Diseases

## 2023-11-25 DIAGNOSIS — I5023 Acute on chronic systolic (congestive) heart failure: Secondary | ICD-10-CM | POA: Diagnosis not present

## 2023-11-25 DIAGNOSIS — A4901 Methicillin susceptible Staphylococcus aureus infection, unspecified site: Secondary | ICD-10-CM | POA: Diagnosis not present

## 2023-11-25 DIAGNOSIS — T827XXA Infection and inflammatory reaction due to other cardiac and vascular devices, implants and grafts, initial encounter: Secondary | ICD-10-CM | POA: Diagnosis not present

## 2023-11-26 ENCOUNTER — Other Ambulatory Visit (HOSPITAL_BASED_OUTPATIENT_CLINIC_OR_DEPARTMENT_OTHER): Payer: Self-pay

## 2023-11-26 ENCOUNTER — Encounter: Payer: Self-pay | Admitting: Infectious Diseases

## 2023-11-26 ENCOUNTER — Ambulatory Visit: Payer: Medicaid Other | Admitting: Infectious Diseases

## 2023-11-26 ENCOUNTER — Other Ambulatory Visit: Payer: Self-pay

## 2023-11-26 VITALS — Resp 17 | Ht 67.0 in | Wt 158.6 lb

## 2023-11-26 DIAGNOSIS — Z452 Encounter for adjustment and management of vascular access device: Secondary | ICD-10-CM | POA: Diagnosis not present

## 2023-11-26 DIAGNOSIS — F1121 Opioid dependence, in remission: Secondary | ICD-10-CM | POA: Diagnosis not present

## 2023-11-26 DIAGNOSIS — Z79899 Other long term (current) drug therapy: Secondary | ICD-10-CM

## 2023-11-26 DIAGNOSIS — T827XXA Infection and inflammatory reaction due to other cardiac and vascular devices, implants and grafts, initial encounter: Secondary | ICD-10-CM

## 2023-11-26 NOTE — Progress Notes (Addendum)
Patient Active Problem List   Diagnosis Date Noted   MSSA (methicillin susceptible Staphylococcus aureus) infection 10/02/2023   Staphylococcus aureus infection 09/30/2023   Infection associated with driveline of left ventricular assist device (LVAD) (HCC) 09/29/2023   AKI (acute kidney injury) (HCC) 07/14/2023   Acute on chronic systolic CHF (congestive heart failure) (HCC) 07/14/2023   Elevated troponin 07/14/2023   Sinus tachycardia 07/14/2023   PVC (premature ventricular contraction) 07/14/2023   Acute decompensated heart failure (HCC) 07/14/2023   History of asthma 07/14/2023   Myocardial injury 07/14/2023   Polycythemia 07/14/2023   Chest pain 07/14/2023   Transaminitis 07/14/2023   Elevated bilirubin 07/14/2023   CHF exacerbation (HCC) 07/14/2023   Opioid dependence in remission (HCC) 05/30/2017   ADD (attention deficit disorder) 08/29/2015   Visit for preventive health examination 02/21/2015   Mild persistent asthma 02/14/2015   Attention deficit disorder 01/24/2011   SEIZURE DISORDER 09/14/2010   ANXIETY STATE, UNSPECIFIED 11/06/2009   BICEPS TENDINITIS, LEFT 11/22/2008   ASTHMA 09/26/2008    Patient's Medications  New Prescriptions   No medications on file  Previous Medications   ALBUTEROL (VENTOLIN HFA) 108 (90 BASE) MCG/ACT INHALER    Inhale 2 puffs into the lungs every 6 (six) hours as needed for wheezing or shortness of breath. Shortness of breath   CEFAZOLIN (ANCEF) IVPB    Inject 2 g into the vein every 8 (eight) hours. Indication:  MSSA LVAD DLI First Dose: Yes Last Day of Therapy:  12/24/23 Labs - Once weekly:  CBC/D and BMP, Labs - Once weekly: ESR and CRP Method of administration: IV Push Method of administration may be changed at the discretion of home infusion pharmacist based upon assessment of the patient and/or caregiver's ability to self-administer the medication ordered.   CETIRIZINE (ZYRTEC) 10 MG TABLET    Take 1 tablet (10 mg total) by  mouth at bedtime.   DIGOXIN (LANOXIN) 0.125 MG TABLET    Take 1 tablet (0.125 mg total) by mouth daily.   ENOXAPARIN (LOVENOX) 40 MG/0.4ML INJECTION    Inject 0.4 mLs (40 mg total) into the skin every 12 (twelve) hours.   GABAPENTIN (NEURONTIN) 300 MG CAPSULE    Take 1 capsule (300 mg total) by mouth 3 (three) times daily.   HYDROMORPHONE (DILAUDID) 4 MG TABLET    Take 1 tablet (4 mg total) by mouth every 4 (four) hours as needed for up to 5 doses for severe pain (pain score 7-10) (take 30 minutes before daily wound care). Take only once a day 30 min before abdominal wound care   HYDROXYZINE (ATARAX) 25 MG TABLET    Take 1 tablet (25 mg total) by mouth 3 (three) times daily as needed for itching.   IVABRADINE (CORLANOR) 5 MG TABS TABLET    Take 1 tablet (5 mg total) by mouth 2 (two) times daily with a meal.   LEVOFLOXACIN (LEVAQUIN) 750 MG TABLET    Take 1 tablet (750 mg total) by mouth daily.   LOSARTAN (COZAAR) 25 MG TABLET    Take 1 tablet (25 mg total) by mouth 2 (two) times daily.   MINOCYCLINE (MINOCIN) 100 MG CAPSULE    Take 2 capsules (200 mg total) by mouth 2 (two) times daily.   PANTOPRAZOLE (PROTONIX) 40 MG TABLET    Take 1 tablet (40 mg total) by mouth daily.   SERTRALINE (ZOLOFT) 25 MG TABLET    Take 1 tablet (25 mg total) by mouth daily.  SPIRONOLACTONE (ALDACTONE) 25 MG TABLET    Take 1 tablet (25 mg total) by mouth daily.   SUBOXONE 8-2 MG FILM    Place 1 Film under the tongue daily.   TRAZODONE (DESYREL) 50 MG TABLET    Take 1 tablet (50 mg total) by mouth at bedtime as needed for sleep.   TRIAMCINOLONE CREAM (KENALOG) 0.1 %    Apply 1 Application topically 2 (two) times daily.   WARFARIN (COUMADIN) 2.5 MG TABLET    Take 5 mg (2 tablets) every Monday and 2.5 mg (1 tablet) all other days or as directed by coumadin clinic.   ZINC SULFATE 220 (50 ZN) MG TABS    Take 1 tablet (220 mg total) by mouth daily.  Modified Medications   No medications on file  Discontinued Medications    No medications on file    Subjective: 71 Y O male with HTN, asthma, drug abuse, seizures, CHF s/p HMIII lVAD 07/29/23 with h/o MSSA lvad infeetion in oct 2024 s/p I and D *3 discharged on 6 weeks of IV cefazolin EOT 11/18/23 who is here for HFU after recent hospital admission ( 11/5-11/21) for persistent DL infection. Underwent multiple debridements 11/7 ( MSSA, Enterobacter cloacae), 11/15 ( MSSA, Enterobacter cloacae, Stenotrophomonas maltophilia) and 11/20 ( MSSA, strenotrophomonas maltophilia, E faecalis). New PICC placed 11/10 after old removed for concern for infection. Hospital course was also concerns for ? Allergy to cefazolin vs chlorhexidine. However seems to have tolerated cefazolin. Discharged on 11/21 to complete 6 weeks course of IV cefazolin, levofloxacin and minocycline.   12/4 Reports getting IV abtx through PICC, he is also taking levofloxacin and minocycline but missed 1-2 doses of pills. He has some nausea sometimes but no vomiting. Denies abdominal pain. Stool is soft but no diarrhea. No concerns with PICC. He was seen by Cardiology on 11/26 as well as last seen by VAD nurse on 12/2, exam findings as well as pic in chart not concerning for infection. Site does not tunnel, moderate serosanguinous drainage. No new concerns   Review of Systems: Denies fevers, chills. All systems reviewed and negative  Past Medical History:  Diagnosis Date   Acid reflux    ADHD (attention deficit hyperactivity disorder)    Asthma    Back pain    LVAD (left ventricular assist device) present (HCC)    Seizures (HCC)    Resolved   Past Surgical History:  Procedure Laterality Date   APPLICATION OF WOUND VAC N/A 10/01/2023   Procedure: APPLICATION OF WOUND VAC;  Surgeon: Lovett Sox, MD;  Location: [facility];  Service: Thoracic;  Laterality: N/A;   APPLICATION OF WOUND VAC N/A 10/03/2023   Procedure: Wound Vac Change;  Surgeon: Lovett Sox, MD;  Location: [facility] OR;  Service: Thoracic;   Laterality: N/A;   APPLICATION OF WOUND VAC N/A 10/07/2023   Procedure: WOUND VAC CHANGE;  Surgeon: Lovett Sox, MD;  Location: [facility];  Service: Thoracic;  Laterality: N/A;   APPLICATION OF WOUND VAC N/A 10/30/2023   Procedure: APPLICATION OF VERAFLO WOUND VAC;  Surgeon: Lovett Sox, MD;  Location: [facility];  Service: Vascular;  Laterality: N/A;   APPLICATION OF WOUND VAC N/A 11/07/2023   Procedure: WOUND VAC CHANGE;  Surgeon: Lovett Sox, MD;  Location: [facility];  Service: Thoracic;  Laterality: N/A;   APPLICATION OF WOUND VAC N/A 11/12/2023   Procedure: REMOVAL OF WOUND VAC with application of myriad;  Surgeon: Lovett Sox, MD;  Location: [facility];  Service: Thoracic;  Laterality: N/A;   CHOLECYSTECTOMY     INCISION AND DRAINAGE OF WOUND N/A 10/30/2023   Procedure: DEBRIDEMENT OF VAD TUNNEL;  Surgeon: Lovett Sox, MD;  Location: [facility];  Service: Vascular;  Laterality: N/A;   INSERTION OF IMPLANTABLE LEFT VENTRICULAR ASSIST DEVICE N/A 07/30/2023   Procedure: INSERTION OF IMPLANTABLE LEFT VENTRICULAR ASSIST DEVICE;  Surgeon: Lovett Sox, MD;  Location: [facility];  Service: Open Heart Surgery;  Laterality: N/A;   IR THORACENTESIS ASP PLEURAL SPACE W/IMG GUIDE  08/08/2023   RIGHT HEART CATH N/A 07/28/2023   Procedure: RIGHT HEART CATH;  Surgeon: Dolores Patty, MD;  Location: [facility];  Service: Cardiovascular;  Laterality: N/A;   RIGHT/LEFT HEART CATH AND CORONARY ANGIOGRAPHY N/A 07/16/2023   Procedure: RIGHT/LEFT HEART CATH AND CORONARY ANGIOGRAPHY;  Surgeon: Dolores Patty, MD;  Location: [facility];  Service: Cardiovascular;  Laterality: N/A;   STERNAL WOUND DEBRIDEMENT N/A 10/01/2023   Procedure: VAD TUNNEL DEBRIDEMENT;  Surgeon: Lovett Sox, MD;  Location: [facility];  Service: Thoracic;  Laterality: N/A;   STERNAL WOUND DEBRIDEMENT N/A 10/03/2023   Procedure: Wound Irrigation;  Surgeon: Lovett Sox, MD;  Location: [facility];  Service: Thoracic;  Laterality:  N/A;   STERNAL WOUND DEBRIDEMENT N/A 10/07/2023   Procedure: VAD TUNNEL DEBRIDEMENT USING MYRIAD MORCELLS FINE;  Surgeon: Lovett Sox, MD;  Location: [facility];  Service: Thoracic;  Laterality: N/A;   STERNAL WOUND DEBRIDEMENT N/A 11/07/2023   Procedure: VAD TUNNEL WOUND DEBRIDEMENT;  Surgeon: Lovett Sox, MD;  Location: [facility];  Service: Thoracic;  Laterality: N/A;   TEE WITHOUT CARDIOVERSION N/A 07/30/2023   Procedure: TRANSESOPHAGEAL ECHOCARDIOGRAM;  Surgeon: Lovett Sox, MD;  Location[facility];  Service: Open Heart Surgery;  Laterality: N/A;   TOOTH EXTRACTION N/A 07/23/2023   Procedure: DENTAL RESTORATION/EXTRACTIONS;  Surgeon: Ocie Doyne, DMD;  Location: [facility];  Service: Oral Surgery;  Laterality: N/A;   WISDOM TOOTH EXTRACTION      Social History   Tobacco Use   Smoking status: Every Day    Types: Cigarettes   Smokeless tobacco: Current    Types: Chew  Vaping Use   Vaping status: Never Used  Substance Use Topics   Alcohol use: Not Currently   Drug use: Not Currently    Comment: last use "many years ago"    Family History  Problem Relation Age of Onset   Crohn's disease Maternal Grandmother    Crohn's disease Maternal Uncle    Hypertension Mother        Living   Congestive Heart Failure Maternal Grandmother    Hypertension Other        Maternal Aunts & Uncles    Allergies  Allergen Reactions   Chlorhexidine Dermatitis and Rash   Other Anaphylaxis    Tree Nuts   Peanuts [Peanut Oil] Anaphylaxis    Health Maintenance  Topic Date Due   DTaP/Tdap/Td (2 - Tdap) 12/23/2013   INFLUENZA VACCINE  07/24/2023   COVID-19 Vaccine (1 - 2023-24 season) Never done   Hepatitis C Screening  Completed   HIV Screening  Completed   HPV VACCINES  Aged Out    Objective: Resp 17   Ht 5\' 7"  (1.702 m)   Wt 158 lb 9.6 oz (71.9 kg)   BMI 24.84 kg/m    Physical Exam Constitutional:      Appearance: Normal appearance.  HENT:     Head: Normocephalic and atraumatic.       Mouth: Mucous membranes are  moist.  Eyes:    Conjunctiva/sclera: Conjunctivae normal.     Pupils: Pupils are equal, round, and b/l symmetrical   Cardiovascular:     Rate and Rhythm: Normal rate and regular rhythm.     Heart sounds:   Pulmonary:     Effort: Pulmonary effort is normal.     Breath sounds:   Abdominal:     General: Non distended     Palpations: VAD dressing C/D/I, surrounding skin w no cellulitis   Musculoskeletal:        General: Normal range of motion.   Skin:    General: Skin is warm and dry.     Comments: rt arm picc with no concerns  Neurological:     General: grossly non focal     Mental Status: awake, alert and oriented to person, place, and time.   Psychiatric:        Mood and Affect: Mood normal.   Lab Results Lab Results  Component Value Date   WBC 8.8 11/18/2023   HGB 13.9 11/18/2023   HCT 43.8 11/18/2023   MCV 83.3 11/18/2023   PLT 283 11/18/2023    Lab Results  Component Value Date   CREATININE 0.95 11/18/2023   BUN 20 11/18/2023   NA 136 11/18/2023   K 3.9 11/18/2023   CL 99 11/18/2023   CO2 26 11/18/2023    Lab Results  Component Value Date   ALT 11 11/18/2023   AST 21 11/18/2023   ALKPHOS 152 (H) 11/18/2023   BILITOT 0.5 11/18/2023    Lab Results  Component Value Date   CHOL 74 07/15/2023   HDL 16 (L) 07/15/2023   LDLCALC 30 07/15/2023   TRIG 140 07/15/2023   CHOLHDL 4.6 07/15/2023   Lab Results  Component Value Date   LABRPR NON REAC 09/26/2008   No results found for: "HIV1RNAQUANT", "HIV1RNAVL", "CD4TABS"   Assessment/Plan # Persistent LVAD infection  - 11/1  wound cx MSSA - 11/6 CT  No associated drainable fluid collection identified. Mild splenomegaly  - 11/7 I&D, wound VAC placement. OR cx Enterobacter, MSSA - 11/15 I and D, OR cx Enterobacter, MSSA and stenotrophomonas maltophilia - 11/20 I and D,  Wound noted to be clean.  Cx MSSA E faecalis, stenotrophomonas maltophilia  Plan - complete 6 weeks of IV  cefazolin, levofloxacin and minocycline. EOT 12/24/23 - Fu in 4 weeks - It appears omadacycline sensitivity was not send. Minocycline S to Stenotrophomonas. Hence, possibly will need to do po suppression with levofloxacin and minocycyline  # Medication Monitoring - 11/26 CBC and CMP reviewed - last labs from [facility] requested  # PICC  - no concerns   I have personally spent at least 60 minutes involved in face-to-face and non-face-to-face activities for this patient on the day of the visit. Professional time spent includes the following activities: Preparing to see the patient (review of tests), Obtaining and/or reviewing separately obtained history (admission/discharge record), Performing a medically appropriate examination and/or evaluation , Ordering medications/tests/procedures, referring and communicating with other health care professionals, Documenting clinical information in the EMR, Independently interpreting results (not separately reported), Communicating results to the patient/family/caregiver, Counseling and educating the patient/family/caregiver and Care coordination (not separately reported).   Of note, portions of this note may have been created with voice recognition software. While this note has been edited for accuracy, occasional wrong-word or 'sound-a-like' substitutions may have occurred due to the inherent limitations of voice recognition software.   Victoriano Lain, MD [facility] for  Infectious Disease [facility] 11/26/2023, 3:45 PM

## 2023-11-27 ENCOUNTER — Telehealth (HOSPITAL_COMMUNITY): Payer: Self-pay | Admitting: Unknown Physician Specialty

## 2023-11-27 NOTE — Telephone Encounter (Signed)
Received call from pts [facility] agency stating they are unable to get in touch with the pt. We reached out to the pts phone and his wifes phone but were unable to reach either. VM left for both pt and his wife asking them to call the [facility] agency.  Carlton Adam RN, BSN VAD Coordinator 24/7 Pager [phone]

## 2023-11-28 ENCOUNTER — Other Ambulatory Visit (HOSPITAL_COMMUNITY): Payer: Self-pay

## 2023-11-28 ENCOUNTER — Ambulatory Visit: Payer: Medicaid Other | Admitting: General Practice

## 2023-11-28 DIAGNOSIS — Z95811 Presence of heart assist device: Secondary | ICD-10-CM

## 2023-11-28 DIAGNOSIS — Z452 Encounter for adjustment and management of vascular access device: Secondary | ICD-10-CM | POA: Insufficient documentation

## 2023-11-28 DIAGNOSIS — T827XXA Infection and inflammatory reaction due to other cardiac and vascular devices, implants and grafts, initial encounter: Secondary | ICD-10-CM | POA: Diagnosis not present

## 2023-11-28 DIAGNOSIS — Z7901 Long term (current) use of anticoagulants: Secondary | ICD-10-CM

## 2023-11-28 DIAGNOSIS — Z79899 Other long term (current) drug therapy: Secondary | ICD-10-CM | POA: Insufficient documentation

## 2023-11-30 DIAGNOSIS — T827XXA Infection and inflammatory reaction due to other cardiac and vascular devices, implants and grafts, initial encounter: Secondary | ICD-10-CM | POA: Diagnosis not present

## 2023-11-30 DIAGNOSIS — I5023 Acute on chronic systolic (congestive) heart failure: Secondary | ICD-10-CM | POA: Diagnosis not present

## 2023-11-30 DIAGNOSIS — A4901 Methicillin susceptible Staphylococcus aureus infection, unspecified site: Secondary | ICD-10-CM | POA: Diagnosis not present

## 2023-12-01 ENCOUNTER — Other Ambulatory Visit (HOSPITAL_COMMUNITY): Payer: Self-pay | Admitting: *Deleted

## 2023-12-01 ENCOUNTER — Telehealth (HOSPITAL_COMMUNITY): Payer: Self-pay | Admitting: *Deleted

## 2023-12-01 ENCOUNTER — Ambulatory Visit (HOSPITAL_COMMUNITY)
Admission: RE | Admit: 2023-12-01 | Discharge: 2023-12-01 | Disposition: A | Discharge status: Home / Self Care | Payer: Medicaid Other | Source: Ambulatory Visit | Attending: Cardiology | Admitting: Cardiology

## 2023-12-01 ENCOUNTER — Ambulatory Visit (HOSPITAL_COMMUNITY)
Admission: RE | Admit: 2023-12-01 | Discharge: 2023-12-01 | Disposition: A | Discharge status: Home / Self Care | Payer: Medicaid Other | Source: Ambulatory Visit | Attending: Internal Medicine | Admitting: Internal Medicine

## 2023-12-01 DIAGNOSIS — Z452 Encounter for adjustment and management of vascular access device: Secondary | ICD-10-CM

## 2023-12-01 DIAGNOSIS — Z95811 Presence of heart assist device: Secondary | ICD-10-CM

## 2023-12-01 DIAGNOSIS — T827XXA Infection and inflammatory reaction due to other cardiac and vascular devices, implants and grafts, initial encounter: Secondary | ICD-10-CM

## 2023-12-01 DIAGNOSIS — Z7901 Long term (current) use of anticoagulants: Secondary | ICD-10-CM | POA: Diagnosis not present

## 2023-12-01 NOTE — Progress Notes (Signed)
Patient presents for dressing change with Dr Donata Clay in [facility] today alone. Reports no problems with VAD equipment or concerns with drive line.  Pt tells me that he has been doing well. Denies lightheadedness, dizziness, falls, shortness of breath, and signs of bleeding. Pt states that his pain is much better.   Pts wife Fatima Blank has been doing his driveline dressing change daily since hospital d/c. See below for full wound note.   Pt is currently on Cefazolin 2gr every 8 hrs, Minocycline and Levaquin. Pt confirms that he is taking these meds. IV antibiotics will end 12/24/23. Pt has f/u with ID on 12/3.  Kenalog cream made a big difference in his rash and itching. Rash noted on along abdomen today.  Pt reports PICC line was pulled out yesterday. States he woke up that way. PICC at 11 cm today. PICC line dressing changed today using sterile technique. Outer dressing removed using sterile techinique. Cleaned with betadine x 2 and allowed to dry. 1 suture intact. Covered with sorbaview dressing. Sent pt for chest xray to determine PICC placement and possible need for replacement.     Exit Site Care: Drive line is being maintained daily by pt's wife Kendal. Existing VAD dressing removed and site care performed using sterile technique by Dr Maren Beach. Outer edges of wound cleansed with betadine x 2, Vashe soaked 2 x 2 used to lightly debride wound bed, and gauze dressing with 2 VASHE moistened 4 x 4 placed directly on wound bed. Covered with 2 large tegaderms instead of tape. Site does not tunnel. The velour is significantly exposed at the exit site. Small amount of serosanguinous drainage noted on previous dressing with some slimy yellow drainage. Anchor applied for increased stabilization of drive line under tegaderm.  Pt denies fever or chills. Pt provided with 7 daily kits, 7 anchors, 3 boxes of large tegaderms, and a box of 2 x 2 gauze for home use.     Plan: Coumadin dosing per Lauren  PharmD- Continue daily wet to dry dressing changes using VASHE solution Return to [facility] clinic in 1 week for wound check  Alyce Pagan RN [facility] Coordinator  Office: [phone]  24/7 Pager: [phone]

## 2023-12-01 NOTE — Telephone Encounter (Signed)
PICC line dislodged- out 11 cm. Today's chest xray reviewed with Dr Gasper Lloyd. Will plan to replace PICC in IR due to infection risk with line pulled out.  Spoke to [place] in IR. Pt scheduled for PICC replacement 12/11 at 0800. Pt to arrive at 0730. Order placed in EP[facility].   Spoke with pt regarding the above. Instructed to come to clinic after PICC replacement for INR. He verbalized understanding to all the above.  Alyce Pagan RN [facility] Coordinator  Office: [phone]  24/7 Pager: [phone]

## 2023-12-01 NOTE — Addendum Note (Signed)
Encounter addended by: Edward Qualia on: 12/01/2023 10:16 AM  Actions taken: Imaging Exam ended

## 2023-12-03 ENCOUNTER — Ambulatory Visit (HOSPITAL_COMMUNITY): Payer: Self-pay | Admitting: Pharmacist

## 2023-12-03 ENCOUNTER — Ambulatory Visit (HOSPITAL_COMMUNITY)
Admission: RE | Admit: 2023-12-03 | Discharge: 2023-12-03 | Disposition: A | Discharge status: Home / Self Care | Payer: Medicaid Other | Source: Ambulatory Visit | Attending: Internal Medicine | Admitting: Internal Medicine

## 2023-12-03 DIAGNOSIS — T82524A Displacement of infusion catheter, initial encounter: Secondary | ICD-10-CM | POA: Diagnosis not present

## 2023-12-03 DIAGNOSIS — Z7901 Long term (current) use of anticoagulants: Secondary | ICD-10-CM

## 2023-12-03 DIAGNOSIS — Z95811 Presence of heart assist device: Secondary | ICD-10-CM

## 2023-12-03 DIAGNOSIS — Z452 Encounter for adjustment and management of vascular access device: Secondary | ICD-10-CM

## 2023-12-03 DIAGNOSIS — T827XXA Infection and inflammatory reaction due to other cardiac and vascular devices, implants and grafts, initial encounter: Secondary | ICD-10-CM

## 2023-12-03 LAB — PROTIME-INR
INR: 1.4 — ABNORMAL HIGH (ref 0.8–1.2)
Prothrombin Time: 16.9 s — ABNORMAL HIGH (ref 11.4–15.2)

## 2023-12-03 MED ORDER — HEPARIN SOD (PORK) LOCK FLUSH 100 UNIT/ML IV SOLN
INTRAVENOUS | Status: AC
  Filled 2023-12-03: qty 5

## 2023-12-03 MED ORDER — LIDOCAINE HCL 1 % IJ SOLN
INTRAMUSCULAR | Status: AC
  Filled 2023-12-03: qty 20

## 2023-12-03 NOTE — Procedures (Signed)
PROCEDURE SUMMARY:  Successful exchange of a dual lumen PICC line to left basilic vein. Length 41cm Tip at lower SVC/RA No complications PICC flushed and capped Ready for use. EBL = trace  Please see full dictation in Imaging section for details.   Shaundra Fullam S Yoselyn Mcglade PA-C 12/03/2023 9:01 AM

## 2023-12-05 ENCOUNTER — Other Ambulatory Visit (HOSPITAL_COMMUNITY): Payer: Self-pay | Admitting: *Deleted

## 2023-12-05 DIAGNOSIS — Z7901 Long term (current) use of anticoagulants: Secondary | ICD-10-CM

## 2023-12-05 DIAGNOSIS — A4901 Methicillin susceptible Staphylococcus aureus infection, unspecified site: Secondary | ICD-10-CM | POA: Diagnosis not present

## 2023-12-05 DIAGNOSIS — T827XXA Infection and inflammatory reaction due to other cardiac and vascular devices, implants and grafts, initial encounter: Secondary | ICD-10-CM | POA: Diagnosis not present

## 2023-12-05 DIAGNOSIS — Z95811 Presence of heart assist device: Secondary | ICD-10-CM

## 2023-12-07 ENCOUNTER — Other Ambulatory Visit (HOSPITAL_COMMUNITY): Payer: Self-pay | Admitting: Internal Medicine

## 2023-12-07 DIAGNOSIS — Z95811 Presence of heart assist device: Secondary | ICD-10-CM

## 2023-12-07 DIAGNOSIS — T827XXA Infection and inflammatory reaction due to other cardiac and vascular devices, implants and grafts, initial encounter: Secondary | ICD-10-CM

## 2023-12-07 DIAGNOSIS — Z452 Encounter for adjustment and management of vascular access device: Secondary | ICD-10-CM

## 2023-12-08 ENCOUNTER — Other Ambulatory Visit (HOSPITAL_COMMUNITY): Payer: Medicaid Other

## 2023-12-08 DIAGNOSIS — T827XXA Infection and inflammatory reaction due to other cardiac and vascular devices, implants and grafts, initial encounter: Secondary | ICD-10-CM | POA: Diagnosis not present

## 2023-12-08 DIAGNOSIS — A4901 Methicillin susceptible Staphylococcus aureus infection, unspecified site: Secondary | ICD-10-CM | POA: Diagnosis not present

## 2023-12-08 DIAGNOSIS — I5023 Acute on chronic systolic (congestive) heart failure: Secondary | ICD-10-CM | POA: Diagnosis not present

## 2023-12-09 ENCOUNTER — Ambulatory Visit (HOSPITAL_COMMUNITY)
Admission: RE | Admit: 2023-12-09 | Discharge: 2023-12-09 | Disposition: A | Discharge status: Home / Self Care | Payer: Medicaid Other | Source: Ambulatory Visit | Attending: Cardiology | Admitting: Cardiology

## 2023-12-09 ENCOUNTER — Ambulatory Visit (HOSPITAL_COMMUNITY): Payer: Self-pay | Admitting: Pharmacist

## 2023-12-09 DIAGNOSIS — Z95811 Presence of heart assist device: Secondary | ICD-10-CM | POA: Diagnosis not present

## 2023-12-09 DIAGNOSIS — Z7901 Long term (current) use of anticoagulants: Secondary | ICD-10-CM | POA: Insufficient documentation

## 2023-12-09 DIAGNOSIS — Z4509 Encounter for adjustment and management of other cardiac device: Secondary | ICD-10-CM | POA: Diagnosis not present

## 2023-12-09 LAB — PROTIME-INR
INR: 2.3 — ABNORMAL HIGH (ref 0.8–1.2)
Prothrombin Time: 25.2 s — ABNORMAL HIGH (ref 11.4–15.2)

## 2023-12-09 NOTE — Progress Notes (Addendum)
Patient presents for dressing change with Dr Donata Clay in [facility] today alone. Reports no problems with VAD equipment or concerns with drive line.  Pt is currently on Cefazolin 2gr every 8 hrs, Minocycline and Levaquin. Pt confirms that he is taking these meds. IV antibiotics will end 12/24/23. Pt has f/u with ID on 12/3.  Kenalog cream made a big difference in his rash and itching. Rash noted on along abdomen today but much better.  Exit Site Care: Drive line is being maintained daily by pt's wife Kendal. Existing VAD dressing removed and site care performed using sterile technique by Dr Maren Beach. Outer edges of wound cleansed with betadine x 2, Vashe soaked 2 x 2 used to lightly debride wound bed, and gauze dressing with 2 VASHE moistened 4 x 4 placed directly on wound bed. Covered with 2 large tegaderms instead of tape. Site does not tunnel. The velour is significantly exposed at the exit site. Small amount of serosanguinous drainage noted on previous dressing with some slimy yellow drainage. Anchor applied for increased stabilization of drive line under tegaderm.  Pt denies fever or chills. Pt provided with 7 daily kits, 7 anchors, 3 boxes of large tegaderms, and a large bottle of Vashe for home use.        Plan: Continue daily wet to dry dressing changes using VASHE solution Coumadin dosing per Lauren-pharm D Return to [facility] clinic in 1 week for wound check  Carlton Adam RN [facility] Coordinator  Office: [phone]  24/7 Pager: [phone]

## 2023-12-09 NOTE — Addendum Note (Signed)
Encounter addended by: Lebron Quam, RN on: 12/09/2023 4:32 PM  Actions taken: Charge Capture section accepted

## 2023-12-12 ENCOUNTER — Other Ambulatory Visit (HOSPITAL_COMMUNITY): Payer: Self-pay | Admitting: *Deleted

## 2023-12-12 DIAGNOSIS — Z7901 Long term (current) use of anticoagulants: Secondary | ICD-10-CM

## 2023-12-12 DIAGNOSIS — A4901 Methicillin susceptible Staphylococcus aureus infection, unspecified site: Secondary | ICD-10-CM | POA: Diagnosis not present

## 2023-12-12 DIAGNOSIS — Z95811 Presence of heart assist device: Secondary | ICD-10-CM

## 2023-12-12 DIAGNOSIS — T827XXA Infection and inflammatory reaction due to other cardiac and vascular devices, implants and grafts, initial encounter: Secondary | ICD-10-CM | POA: Diagnosis not present

## 2023-12-12 NOTE — Addendum Note (Signed)
Addended by: Alyce Pagan B on: 12/12/2023 09:57 AM   Modules accepted: Orders

## 2023-12-14 DIAGNOSIS — T827XXA Infection and inflammatory reaction due to other cardiac and vascular devices, implants and grafts, initial encounter: Secondary | ICD-10-CM | POA: Diagnosis not present

## 2023-12-14 DIAGNOSIS — I5023 Acute on chronic systolic (congestive) heart failure: Secondary | ICD-10-CM | POA: Diagnosis not present

## 2023-12-14 DIAGNOSIS — A4901 Methicillin susceptible Staphylococcus aureus infection, unspecified site: Secondary | ICD-10-CM | POA: Diagnosis not present

## 2023-12-15 ENCOUNTER — Ambulatory Visit (HOSPITAL_COMMUNITY)
Admission: RE | Admit: 2023-12-15 | Discharge: 2023-12-15 | Disposition: A | Discharge status: Home / Self Care | Payer: Medicaid Other | Source: Ambulatory Visit | Attending: Cardiology | Admitting: Cardiology

## 2023-12-15 ENCOUNTER — Ambulatory Visit (HOSPITAL_COMMUNITY): Payer: Self-pay | Admitting: Pharmacist

## 2023-12-15 DIAGNOSIS — Z4801 Encounter for change or removal of surgical wound dressing: Secondary | ICD-10-CM | POA: Insufficient documentation

## 2023-12-15 DIAGNOSIS — Z95811 Presence of heart assist device: Secondary | ICD-10-CM | POA: Insufficient documentation

## 2023-12-15 DIAGNOSIS — Z79899 Other long term (current) drug therapy: Secondary | ICD-10-CM | POA: Diagnosis not present

## 2023-12-15 DIAGNOSIS — Z7901 Long term (current) use of anticoagulants: Secondary | ICD-10-CM | POA: Diagnosis not present

## 2023-12-15 LAB — PROTIME-INR
INR: 2.5 — ABNORMAL HIGH (ref 0.8–1.2)
Prothrombin Time: 27.6 s — ABNORMAL HIGH (ref 11.4–15.2)

## 2023-12-15 NOTE — Progress Notes (Signed)
Patient presents for dressing change with Dr Donata Clay in [facility] today alone. Reports no problems with VAD equipment or concerns with drive line.  Pt is currently on Cefazolin 2gr every 8 hrs, Minocycline and Levaquin. Pt confirms that he is taking these meds. IV antibiotics will end 12/24/23. Pt had f/u with ID on 12/3.  Kenalog cream made a big difference in his rash and itching. Rash noted on along abdomen today but much better.  Exit Site Care: Drive line is being maintained daily by pt's wife Kendal. Existing VAD dressing removed and site care performed using sterile technique. Outer edges of wound cleansed with betadine x 2, Vashe soaked 2 x 2 used to lightly debride wound bed, and gauze dressing with 2 VASHE moistened 4 x 4 placed directly on wound bed. Covered with 2 large tegaderms instead of tape. Site tunnels 1.5 cm. The velour is significantly exposed at the exit site. Small amount of serosanguinous drainage noted on previous dressing with some slimy yellow drainage. Anchor applied for increased stabilization of drive line under tegaderm.  Pt denies fever or chills. Pt provided with 14 anchors, 4 boxes of large tegaderms, 2 boxes 4 x 4s, and 20 packs of sterile gloves for home use.     Plan: Continue daily wet to dry dressing changes using VASHE solution Coumadin dosing per Lauren-pharm D Return to [facility] clinic in 1 week for wound check  Alyce Pagan RN [facility] Coordinator  Office: [phone]  24/7 Pager: [phone]

## 2023-12-19 ENCOUNTER — Other Ambulatory Visit (HOSPITAL_COMMUNITY): Payer: Self-pay

## 2023-12-19 ENCOUNTER — Telehealth: Payer: Self-pay

## 2023-12-19 ENCOUNTER — Other Ambulatory Visit: Payer: Self-pay

## 2023-12-19 ENCOUNTER — Ambulatory Visit (INDEPENDENT_AMBULATORY_CARE_PROVIDER_SITE_OTHER): Payer: Medicaid Other | Admitting: Internal Medicine

## 2023-12-19 ENCOUNTER — Encounter: Payer: Self-pay | Admitting: Internal Medicine

## 2023-12-19 VITALS — HR 129 | Temp 97.8°F | Ht 67.0 in | Wt 150.0 lb

## 2023-12-19 DIAGNOSIS — Z7901 Long term (current) use of anticoagulants: Secondary | ICD-10-CM

## 2023-12-19 DIAGNOSIS — Z79899 Other long term (current) drug therapy: Secondary | ICD-10-CM | POA: Diagnosis not present

## 2023-12-19 DIAGNOSIS — T827XXD Infection and inflammatory reaction due to other cardiac and vascular devices, implants and grafts, subsequent encounter: Secondary | ICD-10-CM | POA: Diagnosis not present

## 2023-12-19 DIAGNOSIS — I5023 Acute on chronic systolic (congestive) heart failure: Secondary | ICD-10-CM | POA: Diagnosis not present

## 2023-12-19 DIAGNOSIS — A4901 Methicillin susceptible Staphylococcus aureus infection, unspecified site: Secondary | ICD-10-CM | POA: Diagnosis not present

## 2023-12-19 DIAGNOSIS — T827XXA Infection and inflammatory reaction due to other cardiac and vascular devices, implants and grafts, initial encounter: Secondary | ICD-10-CM | POA: Diagnosis not present

## 2023-12-19 DIAGNOSIS — Z95811 Presence of heart assist device: Secondary | ICD-10-CM

## 2023-12-19 MED ORDER — LEVOFLOXACIN 250 MG PO TABS: 500.0000 mg | ORAL_TABLET | Freq: Every day | ORAL | 5 refills | Status: DC

## 2023-12-19 NOTE — Progress Notes (Unsigned)
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/29/2023   AKI (acute kidney injury) (HCC) 07/14/2023   Acute on chronic systolic CHF (congestive heart failure) (HCC) 07/14/2023   Elevated troponin 07/14/2023   Sinus tachycardia 07/14/2023   PVC (premature ventricular contraction) 07/14/2023   Acute decompensated heart failure (HCC) 07/14/2023   History of asthma 07/14/2023   Myocardial injury 07/14/2023   Polycythemia 07/14/2023   Chest pain 07/14/2023   Transaminitis 07/14/2023   Elevated bilirubin 07/14/2023   CHF exacerbation (HCC) 07/14/2023   Opioid dependence in remission (HCC) 05/30/2017   ADD (attention deficit disorder) 08/29/2015   Visit for preventive health examination 02/21/2015   Mild persistent asthma 02/14/2015   Attention deficit disorder 01/24/2011   SEIZURE DISORDER 09/14/2010   ANXIETY STATE, UNSPECIFIED 11/06/2009   BICEPS TENDINITIS, LEFT 11/22/2008   ASTHMA 09/26/2008    Patient's Medications  New Prescriptions   LEVOFLOXACIN (LEVAQUIN) 250 MG TABLET    Take 2 tablets (500 mg total) by mouth daily.  Previous Medications   ALBUTEROL (VENTOLIN HFA) 108 (90 BASE) MCG/ACT INHALER    Inhale 2 puffs into the lungs every 6 (six) hours as needed for wheezing or shortness of breath. Shortness of breath   CEFAZOLIN (ANCEF) IVPB    Inject 2 g into the vein every 8 (eight) hours. Indication:  MSSA LVAD DLI First Dose: Yes Last Day of Therapy:  12/24/23 Labs - Once weekly:  CBC/D and BMP, Labs - Once weekly: ESR and CRP Method of administration: IV Push Method of administration may be changed at the discretion of home infusion pharmacist  based upon assessment of the patient and/or caregiver's ability to self-administer the medication ordered.   CETIRIZINE (ZYRTEC) 10 MG TABLET    Take 1 tablet (10 mg total) by mouth at bedtime.   DIGOXIN (LANOXIN) 0.125 MG TABLET    Take 1 tablet (0.125 mg total) by mouth daily.   ENOXAPARIN (LOVENOX) 40 MG/0.4ML INJECTION    Inject 0.4 mLs (40 mg total) into the skin every 12 (twelve) hours.   GABAPENTIN (NEURONTIN) 300 MG CAPSULE    Take 1 capsule (300 mg total) by mouth 3 (three) times daily.   HYDROMORPHONE (DILAUDID) 4 MG TABLET    Take 1 tablet (4 mg total) by mouth every 4 (four) hours as needed for up to 5 doses for severe pain (pain score 7-10) (take 30 minutes before daily wound care). Take only once a day 30 min before abdominal wound care   HYDROXYZINE (ATARAX) 25 MG TABLET    Take 1 tablet (25 mg total) by mouth 3 (three) times daily as needed for itching.   IVABRADINE (CORLANOR) 5 MG TABS TABLET    Take 1 tablet (5 mg total) by mouth 2 (two) times daily with a meal.   LOSARTAN (COZAAR) 25 MG TABLET    Take 1 tablet (25 mg total) by mouth 2 (two) times daily.   MINOCYCLINE (MINOCIN) 100 MG CAPSULE    Take 2 capsules (200 mg total) by mouth 2 (two) times daily.   PANTOPRAZOLE (PROTONIX) 40 MG TABLET    Take 1 tablet (40 mg total) by mouth daily.   SERTRALINE (ZOLOFT) 25 MG TABLET  Take 1 tablet (25 mg total) by mouth daily.   SPIRONOLACTONE (ALDACTONE) 25 MG TABLET    Take 1 tablet (25 mg total) by mouth daily.   SUBOXONE 8-2 MG FILM    Place 1 Film under the tongue daily.   TRAZODONE (DESYREL) 50 MG TABLET    Take 1 tablet (50 mg total) by mouth at bedtime as needed for sleep.   TRIAMCINOLONE CREAM (KENALOG) 0.1 %    Apply 1 Application topically 2 (two) times daily.   WARFARIN (COUMADIN) 2.5 MG TABLET    Take 5 mg (2 tablets) every Monday and 2.5 mg (1 tablet) all other days or as directed by coumadin clinic.   ZINC SULFATE 220 (50 ZN) MG TABS    Take 1 tablet (220 mg total) by mouth  daily.  Modified Medications   No medications on file  Discontinued Medications   LEVOFLOXACIN (LEVAQUIN) 750 MG TABLET    Take 1 tablet (750 mg total) by mouth daily.    Subjective: ***  Today 12/27 : Discussed the use of AI scribe software for clinical note transcription with the patient, who gave verbal consent to proceed.  The patient, with a driveline infection, is on a course of IV antibiotics, cefazolin, Levaquin, and minocycline, which is set to end on the first of the month. He expresses relief at the end of the IV antibiotics, but is aware that the infection will persist due to the nature of driveline infections. He is also aware that he will need to continue oral antibiotics for suppression. The patient is also on the path to a transplant, which is expected to take six to eight months.  The patient is also being seen by a wound care specialist, who has been swabbing the wound for cultures. The patient reports that the wound is healing well and there have been no new concerns.  In addition to his physical health, the patient mentions his family, including a baby and an older son. He reports that his family had a good Christmas. Review of Systems: Review of Systems  All other systems reviewed and are negative.   Past Medical History:  Diagnosis Date   Acid reflux    ADHD (attention deficit hyperactivity disorder)    Asthma    Back pain    LVAD (left ventricular assist device) present (HCC)    Seizures (HCC)    Resolved    Social History   Tobacco Use   Smoking status: Every Day    Types: Cigarettes   Smokeless tobacco: Current    Types: Chew  Vaping Use   Vaping status: Never Used  Substance Use Topics   Alcohol use: Not Currently   Drug use: Not Currently    Comment: last use "many years ago"    Family History  Problem Relation Age of Onset   Crohn's disease Maternal Grandmother    Crohn's disease Maternal Uncle    Hypertension Mother        Living    Congestive Heart Failure Maternal Grandmother    Hypertension Other        Maternal Aunts & Uncles    Allergies  Allergen Reactions   Chlorhexidine Dermatitis and Rash   Other Anaphylaxis    Tree Nuts   Peanuts [Peanut Oil] Anaphylaxis    Health Maintenance  Topic Date Due   COVID-19 Vaccine (1) Never done   DTaP/Tdap/Td (2 - Tdap) 12/23/2013   INFLUENZA VACCINE  07/24/2023   Hepatitis C Screening  Completed   HIV Screening  Completed   HPV VACCINES  Aged Out    Objective:  Vitals:   12/19/23 1113  Pulse: (!) 129  Temp: 97.8 F (36.6 C)  TempSrc: Temporal  SpO2: 97%  Weight: 150 lb (68 kg)  Height: 5\' 7"  (1.702 m)   Body mass index is 23.49 kg/m.  Physical Exam Constitutional:      General: He is not in acute distress.    Appearance: He is normal weight. He is not toxic-appearing.  HENT:     Head: Normocephalic and atraumatic.     Right Ear: External ear normal.     Left Ear: External ear normal.     Nose: No congestion or rhinorrhea.     Mouth/Throat:     Mouth: Mucous membranes are moist.     Pharynx: Oropharynx is clear.  Eyes:     Extraocular Movements: Extraocular movements intact.     Conjunctiva/sclera: Conjunctivae normal.     Pupils: Pupils are equal, round, and reactive to light.  Cardiovascular:     Rate and Rhythm: Normal rate.     Comments: LVAD exit site bandaged Pulmonary:     Effort: Pulmonary effort is normal.     Breath sounds: Normal breath sounds.  Abdominal:     General: Abdomen is flat. Bowel sounds are normal.     Palpations: Abdomen is soft.  Musculoskeletal:        General: No swelling. Normal range of motion.     Cervical back: Normal range of motion and neck supple.  Skin:    General: Skin is warm and dry.  Neurological:     General: No focal deficit present.     Mental Status: He is oriented to person, place, and time.  Psychiatric:        Mood and Affect: Mood normal.    Physical Exam          Lab Results Lab  Results  Component Value Date   WBC 8.8 11/18/2023   HGB 13.9 11/18/2023   HCT 43.8 11/18/2023   MCV 83.3 11/18/2023   PLT 283 11/18/2023    Lab Results  Component Value Date   CREATININE 0.95 11/18/2023   BUN 20 11/18/2023   NA 136 11/18/2023   K 3.9 11/18/2023   CL 99 11/18/2023   CO2 26 11/18/2023    Lab Results  Component Value Date   ALT 11 11/18/2023   AST 21 11/18/2023   ALKPHOS 152 (H) 11/18/2023   BILITOT 0.5 11/18/2023    Lab Results  Component Value Date   CHOL 74 07/15/2023   HDL 16 (L) 07/15/2023   LDLCALC 30 07/15/2023   TRIG 140 07/15/2023   CHOLHDL 4.6 07/15/2023   Lab Results  Component Value Date   LABRPR NON REAC 09/26/2008   No results found for: "HIV1RNAQUANT", "HIV1RNAVL", "CD4TABS"   Problem List Items Addressed This Visit   None  Results          Assessment/Plan  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 Currently on IV cefazolin, oral Levaquin, and oral minocycline. IV cefazolin to be discontinued on 12/24/2023. -Discontinue IV cefazolin on 12/24/2023. -Continue oral Levaquin and minocycline. -Reduce Levaquin dose to 500mg  daily. -Continue close monitoring for signs of infection (fevers, chills, wound changes).  Transplant Planning Patient is on the path for transplant, expected in 6-8 months. -Continue current management and follow-up with transplant team.  Follow-up -Schedule appointment in one month (01/19/2024) to assess  progress and manage antibiotics. Pull picc after  Start supprevie levawin 500mg  PO wd and minoc 200 bid(full dose for now). [facility] sens not sent  #[facility] monitoring 12/20 wbc 4.4, wsc 0.87, esr21 , crp 2 #PICC [name], MD [facility] 12/19/2023, 11:34 AM

## 2023-12-19 NOTE — Patient Instructions (Signed)
-  DRIVELINE INFECTION: A driveline infection is a chronic infection that occurs around the tube (driveline) that connects an external device to an internal heart pump. You are currently on IV cefazolin, oral Levaquin, and oral minocycline. The IV cefazolin will be discontinued on December 24, 2023. You will continue taking oral Levaquin and minocycline, with the Levaquin dose reduced to 500mg  daily. Continue minocycline 200mg  PO bid.  Please continue to monitor for any signs of infection, such as fevers, chills, or changes in the wound.  -TRANSPLANT PLANNING:We will continue with your current management plan and pleaes e follow up with the transplant team.  INSTRUCTIONS:  Please schedule a follow-up appointment on January 19, 2024, to assess your progress and manage your antibiotics.

## 2023-12-19 NOTE — Telephone Encounter (Signed)
Per Dr. Thedore Mins patient end date to stop IV abx after last dose and pull picc is on 1/1. Sent message to [employer] about end date.

## 2023-12-20 NOTE — Progress Notes (Unsigned)
New Patient Office Visit  Subjective    Patient ID: Joel York, male    DOB: [birth year]  Age: 37 y.o. MRN: [id number]  CC: No chief complaint on file.   HPI Joel York is a 37 y.o. male presents to establish care ***  Outpatient Encounter Medications as of 12/22/2023  Medication Sig   albuterol (VENTOLIN HFA) 108 (90 Base) MCG/ACT inhaler Inhale 2 puffs into the lungs every 6 (six) hours as needed for wheezing or shortness of breath. Shortness of breath   ceFAZolin (ANCEF) IVPB Inject 2 g into the vein every 8 (eight) hours. Indication:  MSSA LVAD DLI First Dose: Yes Last Day of Therapy:  12/24/23 Labs - Once weekly:  CBC/D and BMP, Labs - Once weekly: ESR and CRP Method of administration: IV Push Method of administration may be changed at the discretion of home infusion pharmacist based upon assessment of the patient and/or caregiver's ability to self-administer the medication ordered.   cetirizine (ZYRTEC) 10 MG tablet Take 1 tablet (10 mg total) by mouth at bedtime.   digoxin (LANOXIN) 0.125 MG tablet Take 1 tablet (0.125 mg total) by mouth daily.   enoxaparin (LOVENOX) 40 MG/0.4ML injection Inject 0.4 mLs (40 mg total) into the skin every 12 (twelve) hours.   gabapentin (NEURONTIN) 300 MG capsule Take 1 capsule (300 mg total) by mouth 3 (three) times daily.   HYDROmorphone (DILAUDID) 4 MG tablet Take 1 tablet (4 mg total) by mouth every 4 (four) hours as needed for up to 5 doses for severe pain (pain score 7-10) (take 30 minutes before daily wound care). Take only once a day 30 min before abdominal wound care   hydrOXYzine (ATARAX) 25 MG tablet Take 1 tablet (25 mg total) by mouth 3 (three) times daily as needed for itching.   ivabradine (CORLANOR) 5 MG TABS tablet Take 1 tablet (5 mg total) by mouth 2 (two) times daily with a meal.   levofloxacin (LEVAQUIN) 250 MG tablet Take 2 tablets (500 mg total) by mouth daily.   losartan (COZAAR) 25 MG tablet Take 1 tablet (25 mg total)  by mouth 2 (two) times daily.   minocycline (MINOCIN) 100 MG capsule Take 2 capsules (200 mg total) by mouth 2 (two) times daily.   pantoprazole (PROTONIX) 40 MG tablet Take 1 tablet (40 mg total) by mouth daily.   sertraline (ZOLOFT) 25 MG tablet Take 1 tablet (25 mg total) by mouth daily.   spironolactone (ALDACTONE) 25 MG tablet Take 1 tablet (25 mg total) by mouth daily.   SUBOXONE 8-2 MG FILM Place 1 Film under the tongue daily.   traZODone (DESYREL) 50 MG tablet Take 1 tablet (50 mg total) by mouth at bedtime as needed for sleep.   triamcinolone cream (KENALOG) 0.1 % Apply 1 Application topically 2 (two) times daily.   warfarin (COUMADIN) 2.5 MG tablet Take 5 mg (2 tablets) every Monday and 2.5 mg (1 tablet) all other days or as directed by coumadin clinic.   Zinc Sulfate 220 (50 Zn) MG TABS Take 1 tablet (220 mg total) by mouth daily.   No facility-administered encounter medications on file as of 12/22/2023.    Past Medical History:  Diagnosis Date   Acid reflux    ADHD (attention deficit hyperactivity disorder)    Asthma    Back pain    LVAD (left ventricular assist device) present (HCC)    Seizures (HCC)    Resolved    Past Surgical History:  Procedure Laterality  Date   APPLICATION OF WOUND VAC N/A 10/01/2023   Procedure: APPLICATION OF WOUND VAC;  Surgeon: Lovett Sox, MD;  Location: [facility];  Service: Thoracic;  Laterality: N/A;   APPLICATION OF WOUND VAC N/A 10/03/2023   Procedure: Wound Vac Change;  Surgeon: Lovett Sox, MD;  Location: [facility];  Service: Thoracic;  Laterality: N/A;   APPLICATION OF WOUND VAC N/A 10/07/2023   Procedure: WOUND VAC CHANGE;  Surgeon: Lovett Sox, MD;  Location: [facility];  Service: Thoracic;  Laterality: N/A;   APPLICATION OF WOUND VAC N/A 10/30/2023   Procedure: APPLICATION OF VERAFLO WOUND VAC;  Surgeon: Lovett Sox, MD;  Location: [facility];  Service: Vascular;  Laterality: N/A;   APPLICATION OF WOUND VAC N/A 11/07/2023   Procedure:  WOUND VAC CHANGE;  Surgeon: Lovett Sox, MD;  Location: [facility];  Service: Thoracic;  Laterality: N/A;   APPLICATION OF WOUND VAC N/A 11/12/2023   Procedure: REMOVAL OF WOUND VAC with application of myriad;  Surgeon: Lovett Sox, MD;  Location: [facility];  Service: Thoracic;  Laterality: N/A;   CHOLECYSTECTOMY     INCISION AND DRAINAGE OF WOUND N/A 10/30/2023   Procedure: DEBRIDEMENT OF VAD TUNNEL;  Surgeon: Lovett Sox, MD;  Location: [facility];  Service: Vascular;  Laterality: N/A;   INSERTION OF IMPLANTABLE LEFT VENTRICULAR ASSIST DEVICE N/A 07/30/2023   Procedure: INSERTION OF IMPLANTABLE LEFT VENTRICULAR ASSIST DEVICE;  Surgeon: Lovett Sox, MD;  Location: [facility];  Service: Open Heart Surgery;  Laterality: N/A;   IR THORACENTESIS ASP PLEURAL SPACE W/IMG GUIDE  08/08/2023   RIGHT HEART CATH N/A 07/28/2023   Procedure: RIGHT HEART CATH;  Surgeon: Dolores Patty, MD;  Location: [facility];  Service: Cardiovascular;  Laterality: N/A;   RIGHT/LEFT HEART CATH AND CORONARY ANGIOGRAPHY N/A 07/16/2023   Procedure: RIGHT/LEFT HEART CATH AND CORONARY ANGIOGRAPHY;  Surgeon: Dolores Patty, MD;  Location: [facility];  Service: Cardiovascular;  Laterality: N/A;   STERNAL WOUND DEBRIDEMENT N/A 10/01/2023   Procedure: VAD TUNNEL DEBRIDEMENT;  Surgeon: Lovett Sox, MD;  Location: [facility];  Service: Thoracic;  Laterality: N/A;   STERNAL WOUND DEBRIDEMENT N/A 10/03/2023   Procedure: Wound Irrigation;  Surgeon: Lovett Sox, MD;  Location: [facility];  Service: Thoracic;  Laterality: N/A;   STERNAL WOUND DEBRIDEMENT N/A 10/07/2023   Procedure: VAD TUNNEL DEBRIDEMENT USING MYRIAD MORCELLS FINE;  Surgeon: Lovett Sox, MD;  Location: [facility];  Service: Thoracic;  Laterality: N/A;   STERNAL WOUND DEBRIDEMENT N/A 11/07/2023   Procedure: VAD TUNNEL WOUND DEBRIDEMENT;  Surgeon: Lovett Sox, MD;  Location: [facility];  Service: Thoracic;  Laterality: N/A;   TEE WITHOUT CARDIOVERSION N/A  07/30/2023   Procedure: TRANSESOPHAGEAL ECHOCARDIOGRAM;  Surgeon: Lovett Sox, MD;  Location: [facility] OR;  Service: Open Heart Surgery;  Laterality: N/A;   TOOTH EXTRACTION N/A 07/23/2023   Procedure: DENTAL RESTORATION/EXTRACTIONS;  Surgeon: Ocie Doyne, DMD;  Location: [facility];  Service: Oral Surgery;  Laterality: N/A;   WISDOM TOOTH EXTRACTION      Family History  Problem Relation Age of Onset   Crohn's disease Maternal Grandmother    Crohn's disease Maternal Uncle    Hypertension Mother        Living   Congestive Heart Failure Maternal Grandmother    Hypertension Other        Maternal Aunts & Uncles    Social History   Socioeconomic History   Marital status: Married    Spouse name: Not on file  Number of children: Not on file   Years of education: Not on file   Highest education level: Not on file  Occupational History   Not on file  Tobacco Use   Smoking status: Every Day    Types: Cigarettes   Smokeless tobacco: Current    Types: Chew  Vaping Use   Vaping status: Never Used  Substance and Sexual Activity   Alcohol use: Not Currently   Drug use: Not Currently    Comment: last use "many years ago"   Sexual activity: Yes  Other Topics Concern   Not on file  Social History Narrative   Not on file   Social Drivers of Health   Financial Resource Strain: Medium Risk (08/12/2023)   Overall Financial Resource Strain (CARDIA)    Difficulty of Paying Living Expenses: Somewhat hard  Food Insecurity: No Food Insecurity (10/28/2023)   Hunger Vital Sign    Worried About Running Out of Food in the Last Year: Never true    Ran Out of Food in the Last Year: Never true  Transportation Needs: No Transportation Needs (10/28/2023)   PRAPARE - Administrator, Civil Service (Medical): No    Lack of Transportation (Non-Medical): No  Physical Activity: Sufficiently Active (07/14/2023)   Exercise Vital Sign    Days of Exercise per Week: 3 days    Minutes of Exercise per  Session: 60 min  Stress: Stress Concern Present (07/14/2023)   [employer] of Occupational Health - Occupational Stress Questionnaire    Feeling of Stress : To some extent  Social Connections: Not on file  Intimate Partner Violence: Not At Risk (10/28/2023)   Humiliation, Afraid, Rape, and Kick questionnaire    Fear of Current or Ex-Partner: No    Emotionally Abused: No    Physically Abused: No    Sexually Abused: No    ROS      Objective    There were no vitals taken for this visit.  Physical Exam  {Labs (Optional):23779}    Assessment & Plan:  There are no diagnoses linked to this encounter.   No follow-ups on file.   Modesto Charon, NP

## 2023-12-22 ENCOUNTER — Ambulatory Visit (HOSPITAL_COMMUNITY): Payer: Self-pay | Admitting: Pharmacist

## 2023-12-22 ENCOUNTER — Ambulatory Visit: Payer: Medicaid Other | Admitting: General Practice

## 2023-12-22 ENCOUNTER — Ambulatory Visit (HOSPITAL_COMMUNITY)
Admission: RE | Admit: 2023-12-22 | Discharge: 2023-12-22 | Disposition: A | Discharge status: Home / Self Care | Payer: Medicaid Other | Source: Ambulatory Visit | Attending: Cardiology

## 2023-12-22 VITALS — BP 100/80 | HR 97 | Temp 97.7°F | Ht 66.5 in | Wt 148.0 lb

## 2023-12-22 DIAGNOSIS — Z95811 Presence of heart assist device: Secondary | ICD-10-CM | POA: Insufficient documentation

## 2023-12-22 DIAGNOSIS — Z7689 Persons encountering health services in other specified circumstances: Secondary | ICD-10-CM | POA: Diagnosis not present

## 2023-12-22 DIAGNOSIS — Z4801 Encounter for change or removal of surgical wound dressing: Secondary | ICD-10-CM | POA: Insufficient documentation

## 2023-12-22 DIAGNOSIS — F1121 Opioid dependence, in remission: Secondary | ICD-10-CM | POA: Diagnosis not present

## 2023-12-22 DIAGNOSIS — Z0000 Encounter for general adult medical examination without abnormal findings: Secondary | ICD-10-CM

## 2023-12-22 DIAGNOSIS — Z7901 Long term (current) use of anticoagulants: Secondary | ICD-10-CM | POA: Insufficient documentation

## 2023-12-22 DIAGNOSIS — T827XXA Infection and inflammatory reaction due to other cardiac and vascular devices, implants and grafts, initial encounter: Secondary | ICD-10-CM | POA: Diagnosis not present

## 2023-12-22 LAB — PROTIME-INR
INR: 1.2 (ref 0.8–1.2)
Prothrombin Time: 15 s (ref 11.4–15.2)

## 2023-12-22 LAB — POCT INR: INR: 1.2 — AB (ref 2.0–3.0)

## 2023-12-22 MED ORDER — ENOXAPARIN SODIUM 40 MG/0.4ML IJ SOSY: 40.0000 mg | PREFILLED_SYRINGE | Freq: Two times a day (BID) | INTRAMUSCULAR | 0 refills | Status: DC

## 2023-12-22 MED ORDER — WARFARIN SODIUM 2.5 MG PO TABS: ORAL_TABLET | ORAL | 5 refills | Status: DC

## 2023-12-22 NOTE — Progress Notes (Signed)
Patient presents for dressing change and INR with Dr Donata Clay in [facility] today alone. Reports no problems with VAD equipment or concerns with drive line.  Pt is currently on Cefazolin 2gr every 8 hrs, Minocycline and Levaquin. Pt confirms that he is taking these meds. IV antibiotics will end 12/24/23. PICC to be pulled 12/25/23. Pt had f/u with ID on 12/19/23.  Kenalog cream made a big difference in his rash and itching. Rash noted on along abdomen today but much better.  Exit Site Care: Drive line is being maintained daily by pt's wife Musician. Existing VAD dressing removed and site care performed using sterile technique. Outer edges of wound cleansed with betadine x 2, Vashe soaked 2 x 2 used to lightly debride wound bed, and gauze dressing with 2 VASHE moistened 4 x 4 placed directly on wound bed. Covered with 2 large tegaderms instead of tape. Site tunnels 1.5 cm. The velour is significantly exposed at the exit site. Small amount of serosanguinous drainage noted on previous dressing with some slimy yellow drainage. Anchor applied for increased stabilization of drive line under tegaderm.  Pt denies fever or chills. Pt provided with 14 anchors, 4 boxes of large tegaderms, 2 boxes 4 x 4s, and 20 packs of sterile gloves for home use.       Plan: Continue daily wet to dry dressing changes using VASHE solution Coumadin dosing per [facility] Return to VAD clinic in 1 week for wound check  Simmie Davies RN VAD Coordinator  Office: [phone]  24/7 Pager: [phone]

## 2023-12-22 NOTE — Assessment & Plan Note (Signed)
Immunizations due to tetanus and flu. Due to insurance, patient advised to check with pharmacy and/or health dept.  Discussed the importance of a healthy diet and regular exercise in order for weight loss, and to reduce the risk of further co-morbidity.  Exam stable.  Follow up in 1 year for repeat physical.

## 2023-12-22 NOTE — Progress Notes (Signed)
LVAD INR 

## 2023-12-22 NOTE — Assessment & Plan Note (Signed)
EMR reviewed briefly.

## 2023-12-22 NOTE — Patient Instructions (Signed)
Please update me if you have any concerns. Please schedule the immunizations at the pharmacy or the health department.   It was a pleasure meeting you!

## 2023-12-22 NOTE — Assessment & Plan Note (Signed)
Stable. Currently on suboxone.

## 2023-12-23 DIAGNOSIS — Z95811 Presence of heart assist device: Secondary | ICD-10-CM | POA: Insufficient documentation

## 2023-12-23 NOTE — Assessment & Plan Note (Signed)
Reviewed hospital notes, labs, procedure notes.   Followed by cardiology.

## 2023-12-24 DIAGNOSIS — A4901 Methicillin susceptible Staphylococcus aureus infection, unspecified site: Secondary | ICD-10-CM | POA: Diagnosis not present

## 2023-12-24 DIAGNOSIS — I5023 Acute on chronic systolic (congestive) heart failure: Secondary | ICD-10-CM | POA: Diagnosis not present

## 2023-12-24 DIAGNOSIS — T827XXA Infection and inflammatory reaction due to other cardiac and vascular devices, implants and grafts, initial encounter: Secondary | ICD-10-CM | POA: Diagnosis not present

## 2023-12-26 ENCOUNTER — Other Ambulatory Visit (HOSPITAL_COMMUNITY): Payer: Self-pay | Admitting: Unknown Physician Specialty

## 2023-12-26 DIAGNOSIS — Z95811 Presence of heart assist device: Secondary | ICD-10-CM

## 2023-12-26 DIAGNOSIS — Z7901 Long term (current) use of anticoagulants: Secondary | ICD-10-CM

## 2023-12-29 ENCOUNTER — Other Ambulatory Visit (HOSPITAL_COMMUNITY): Payer: Medicaid Other

## 2023-12-30 ENCOUNTER — Ambulatory Visit (HOSPITAL_COMMUNITY): Payer: Self-pay | Admitting: Pharmacist

## 2023-12-30 ENCOUNTER — Ambulatory Visit (HOSPITAL_COMMUNITY)
Admission: RE | Admit: 2023-12-30 | Discharge: 2023-12-30 | Disposition: A | Discharge status: Home / Self Care | Payer: Medicaid Other | Source: Ambulatory Visit | Attending: Internal Medicine | Admitting: Internal Medicine

## 2023-12-30 ENCOUNTER — Other Ambulatory Visit (HOSPITAL_COMMUNITY): Payer: Self-pay | Admitting: *Deleted

## 2023-12-30 DIAGNOSIS — Z95811 Presence of heart assist device: Secondary | ICD-10-CM

## 2023-12-30 DIAGNOSIS — Z4801 Encounter for change or removal of surgical wound dressing: Secondary | ICD-10-CM | POA: Insufficient documentation

## 2023-12-30 DIAGNOSIS — Z7901 Long term (current) use of anticoagulants: Secondary | ICD-10-CM | POA: Diagnosis not present

## 2023-12-30 DIAGNOSIS — T827XXA Infection and inflammatory reaction due to other cardiac and vascular devices, implants and grafts, initial encounter: Secondary | ICD-10-CM

## 2023-12-30 LAB — PROTIME-INR
INR: 2.1 — ABNORMAL HIGH (ref 0.8–1.2)
Prothrombin Time: 23.4 s — ABNORMAL HIGH (ref 11.4–15.2)

## 2023-12-30 MED ORDER — MINOCYCLINE HCL 100 MG PO CAPS: 200.0000 mg | ORAL_CAPSULE | Freq: Two times a day (BID) | ORAL | 4 refills | Status: DC

## 2023-12-30 NOTE — Progress Notes (Signed)
 Patient presents for dressing change and INR with Dr Fleeta Ochoa in [facility] today. Reports no problems with VAD equipment or concerns with drive line.  Pt completed IV Cefazolin . PICC line has been removed. He is currently is currently on  Levaquin  500 mg daily and Minocycline  200 mg BID . Pt confirms that he is taking these meds. Pt has f/u with ID on 01/19/24.  Kenalog  cream made a big difference in his rash and itching. Rash resolved.   Exit Site Care: Drive line is being maintained daily by pt's wife Musician. Existing VAD dressing removed and site care performed using sterile technique. Outer edges of wound cleansed with betadine x 2, Vashe soaked 2 x 2 used to lightly debride wound bed, and gauze dressing with 2 VASHE moistened 4 x 4 placed directly on wound bed. Covered with 2 large tegaderms instead of tape. Site tunnels 1.5 cm. The velour is significantly exposed at the exit site. Scant amount of serosanguinous drainage noted on previous dressing with some slimy yellow drainage. Anchor applied for increased stabilization of drive line over tegaderm.  Pt denies fever or chills. Pt provided with 14 daily kits, 14 anchors, 4 boxes of large tegaderms, 1 box 4 x 4s, and 20 packs of sterile gloves for home use.      Plan: Continue daily wet to dry dressing changes using VASHE solution Coumadin  dosing per [facility] Return to VAD clinic in 2 weeks for wound check  Isaiah Knoll RN VAD Coordinator  Office: [phone]  24/7 Pager: [phone]

## 2024-01-08 ENCOUNTER — Other Ambulatory Visit (HOSPITAL_COMMUNITY): Payer: Self-pay | Admitting: Unknown Physician Specialty

## 2024-01-08 DIAGNOSIS — Z7901 Long term (current) use of anticoagulants: Secondary | ICD-10-CM

## 2024-01-08 DIAGNOSIS — Z95811 Presence of heart assist device: Secondary | ICD-10-CM

## 2024-01-13 ENCOUNTER — Ambulatory Visit (HOSPITAL_COMMUNITY): Payer: Self-pay | Admitting: Pharmacist

## 2024-01-13 ENCOUNTER — Ambulatory Visit (HOSPITAL_COMMUNITY)
Admission: RE | Admit: 2024-01-13 | Discharge: 2024-01-13 | Disposition: A | Discharge status: Home / Self Care | Payer: Medicaid Other | Source: Ambulatory Visit | Attending: Cardiology

## 2024-01-13 DIAGNOSIS — Z4801 Encounter for change or removal of surgical wound dressing: Secondary | ICD-10-CM | POA: Diagnosis not present

## 2024-01-13 DIAGNOSIS — Z7901 Long term (current) use of anticoagulants: Secondary | ICD-10-CM | POA: Diagnosis not present

## 2024-01-13 DIAGNOSIS — Z95811 Presence of heart assist device: Secondary | ICD-10-CM | POA: Diagnosis not present

## 2024-01-13 LAB — PROTIME-INR
INR: 1.4 — ABNORMAL HIGH (ref 0.8–1.2)
Prothrombin Time: 17.8 s — ABNORMAL HIGH (ref 11.4–15.2)

## 2024-01-13 NOTE — Progress Notes (Signed)
Patient presents for dressing change and INR with Dr Donata Clay in [facility] today. Reports no problems with VAD equipment or concerns with drive line.  Pt completed IV Cefazolin. PICC line has been removed. He is currently is currently on  Levaquin 500 mg daily and Minocycline 200 mg BID . Pt confirms that he is taking these meds. Pt has f/u with ID on 01/19/24.  Kenalog cream made a big difference in his rash and itching. Rash resolved.   Exit Site Care: Drive line is being maintained daily by pt's wife Musician. Existing VAD dressing removed and site care performed using sterile technique. Outer edges of wound cleansed with betadine x 2, Vashe soaked 2 x 2 used to lightly debride wound bed, and gauze dressing with 2 VASHE moistened 4 x 4 placed directly on wound bed. Covered with 2 large tegaderms instead of tape. Site tunnels 1.5 cm. The velour is significantly exposed at the exit site. Scant amount of serosanguinous drainage noted on previous dressing with some slimy yellow drainage. Anchor applied for increased stabilization of drive line over tegaderm.  Pt denies fever or chills. Pt provided with 7 daily kits, 7 anchors, 2 boxes of large tegaderms,  10 packs of sterile gloves for home use.      Plan: Continue daily wet to dry dressing changes using VASHE solution Coumadin dosing per [facility] Return to VAD clinic in 1 week for wound check  Simmie Davies RN,BSN VAD Coordinator  Office: [phone]  24/7 Pager: [phone]

## 2024-01-13 NOTE — Progress Notes (Signed)
Patient asked for assistance with disability. He states that a referral was sent a few months ago to [facility] and due to change in personnel at [facility] and multiple hospitalizations that it was lost to follow up. Patient requesting a new referral be sent. CSW completed application and sent over to [facility].   Patient understands follow up needed and will reach out if further needs arise. Lasandra Beech, LCSW, CCSW-MCS [phone]

## 2024-01-13 NOTE — Progress Notes (Addendum)
Packet emailed to [facility] for home INR machine per pt request.   Alyce Pagan RN VAD Coordinator  Office: [phone]  24/7 Pager: [phone]

## 2024-01-13 NOTE — Addendum Note (Signed)
Encounter addended by: Marcy Siren, LCSW on: 01/13/2024 3:58 PM  Actions taken: Clinical Note Signed

## 2024-01-14 ENCOUNTER — Other Ambulatory Visit (HOSPITAL_COMMUNITY): Payer: Self-pay | Admitting: *Deleted

## 2024-01-14 DIAGNOSIS — I5022 Chronic systolic (congestive) heart failure: Secondary | ICD-10-CM

## 2024-01-14 DIAGNOSIS — T827XXA Infection and inflammatory reaction due to other cardiac and vascular devices, implants and grafts, initial encounter: Secondary | ICD-10-CM

## 2024-01-14 DIAGNOSIS — Z95811 Presence of heart assist device: Secondary | ICD-10-CM

## 2024-01-14 MED ORDER — CEFADROXIL 500 MG PO CAPS: 1000.0000 mg | ORAL_CAPSULE | Freq: Two times a day (BID) | ORAL | 0 refills | Status: AC

## 2024-01-14 NOTE — Progress Notes (Addendum)
Increased redness noted around pt's exit site at his clinic visit yesterday. Per discussion with Dr Donata Clay and Dr Thedore Mins decision was made to start Cefadroxil 1000 mg BID x 7 days. Pt has follow up with ID on Monday 01/19/24, and follow up in VAD clinic on 01/21/24. Will attempt to re-culture wound at this visit.   Prescription sent to pt's pharmacy.   Called pt to discuss the above- left voicemail.   Called Kendall to discuss the above. She verbalized understanding.   Alyce Pagan RN VAD Coordinator  Office: [phone]  24/7 Pager: [phone]

## 2024-01-16 ENCOUNTER — Other Ambulatory Visit: Payer: Self-pay

## 2024-01-16 ENCOUNTER — Other Ambulatory Visit (HOSPITAL_BASED_OUTPATIENT_CLINIC_OR_DEPARTMENT_OTHER): Payer: Self-pay

## 2024-01-16 ENCOUNTER — Other Ambulatory Visit (HOSPITAL_COMMUNITY): Payer: Self-pay | Admitting: *Deleted

## 2024-01-16 DIAGNOSIS — Z7901 Long term (current) use of anticoagulants: Secondary | ICD-10-CM

## 2024-01-16 DIAGNOSIS — Z95811 Presence of heart assist device: Secondary | ICD-10-CM

## 2024-01-19 ENCOUNTER — Encounter: Payer: Self-pay | Admitting: Internal Medicine

## 2024-01-19 ENCOUNTER — Ambulatory Visit (INDEPENDENT_AMBULATORY_CARE_PROVIDER_SITE_OTHER): Payer: Medicaid Other | Admitting: Internal Medicine

## 2024-01-19 ENCOUNTER — Other Ambulatory Visit: Payer: Self-pay

## 2024-01-19 VITALS — BP 130/88 | HR 111 | Temp 98.0°F | Ht 67.0 in | Wt 154.0 lb

## 2024-01-19 DIAGNOSIS — T827XXA Infection and inflammatory reaction due to other cardiac and vascular devices, implants and grafts, initial encounter: Secondary | ICD-10-CM | POA: Diagnosis not present

## 2024-01-19 DIAGNOSIS — Z8619 Personal history of other infectious and parasitic diseases: Secondary | ICD-10-CM | POA: Diagnosis not present

## 2024-01-19 DIAGNOSIS — B952 Enterococcus as the cause of diseases classified elsewhere: Secondary | ICD-10-CM

## 2024-01-19 DIAGNOSIS — T827XXD Infection and inflammatory reaction due to other cardiac and vascular devices, implants and grafts, subsequent encounter: Secondary | ICD-10-CM

## 2024-01-19 NOTE — Progress Notes (Signed)
Patient Active Problem List   Diagnosis Date Noted   LVAD (left ventricular assist device) present (HCC) 12/23/2023   Annual visit for general adult medical examination without abnormal findings 12/22/2023   Establishing care with new doctor, encounter for 12/22/2023   Medication management 11/28/2023   PICC (peripherally inserted central catheter) in place 11/28/2023   MSSA (methicillin susceptible Staphylococcus aureus) infection 10/02/2023   Staphylococcus aureus infection 09/30/2023   Infection associated with driveline of left ventricular assist device (LVAD) (HCC) 09/29/2023   AKI (acute kidney injury) (HCC) 07/14/2023   Acute on chronic systolic CHF (congestive heart failure) (HCC) 07/14/2023   Elevated troponin 07/14/2023   Sinus tachycardia 07/14/2023   PVC (premature ventricular contraction) 07/14/2023   Acute decompensated heart failure (HCC) 07/14/2023   History of asthma 07/14/2023   Myocardial injury 07/14/2023   Polycythemia 07/14/2023   Chest pain 07/14/2023   Transaminitis 07/14/2023   Elevated bilirubin 07/14/2023   CHF exacerbation (HCC) 07/14/2023   Opioid dependence in remission (HCC) 05/30/2017   ADD (attention deficit disorder) 08/29/2015   Visit for preventive health examination 02/21/2015   Mild persistent asthma 02/14/2015   Attention deficit disorder 01/24/2011   ANXIETY STATE, UNSPECIFIED 11/06/2009   BICEPS TENDINITIS, LEFT 11/22/2008   ASTHMA 09/26/2008    Patient's Medications  New Prescriptions   No medications on file  Previous Medications   ALBUTEROL (VENTOLIN HFA) 108 (90 BASE) MCG/ACT INHALER    Inhale 2 puffs into the lungs every 6 (six) hours as needed for wheezing or shortness of breath. Shortness of breath   CEFADROXIL (DURICEF) 500 MG CAPSULE    Take 2 capsules (1,000 mg total) by mouth 2 (two) times daily for 7 days.   CETIRIZINE (ZYRTEC) 10 MG TABLET    Take 1 tablet (10 mg total) by mouth at bedtime.   DIGOXIN  (LANOXIN) 0.125 MG TABLET    Take 1 tablet (0.125 mg total) by mouth daily.   ENOXAPARIN (LOVENOX) 40 MG/0.4ML INJECTION    Inject 0.4 mLs (40 mg total) into the skin every 12 (twelve) hours.   GABAPENTIN (NEURONTIN) 300 MG CAPSULE    Take 1 capsule (300 mg total) by mouth 3 (three) times daily.   HYDROMORPHONE (DILAUDID) 4 MG TABLET    Take 1 tablet (4 mg total) by mouth every 4 (four) hours as needed for up to 5 doses for severe pain (pain score 7-10) (take 30 minutes before daily wound care). Take only once a day 30 min before abdominal wound care   HYDROXYZINE (ATARAX) 25 MG TABLET    Take 1 tablet (25 mg total) by mouth 3 (three) times daily as needed for itching.   IVABRADINE (CORLANOR) 5 MG TABS TABLET    Take 1 tablet (5 mg total) by mouth 2 (two) times daily with a meal.   LEVOFLOXACIN (LEVAQUIN) 250 MG TABLET    Take 2 tablets (500 mg total) by mouth daily.   LOSARTAN (COZAAR) 25 MG TABLET    Take 1 tablet (25 mg total) by mouth 2 (two) times daily.   MINOCYCLINE (MINOCIN) 100 MG CAPSULE    Take 2 capsules (200 mg total) by mouth 2 (two) times daily.   PANTOPRAZOLE (PROTONIX) 40 MG TABLET    Take 1 tablet (40 mg total) by mouth daily.   SERTRALINE (ZOLOFT) 25 MG TABLET    Take 1 tablet (25 mg total) by mouth daily.   SPIRONOLACTONE (ALDACTONE) 25 MG TABLET  Take 1 tablet (25 mg total) by mouth daily.   SUBOXONE 8-2 MG FILM    Place 1 Film under the tongue daily.   TRAZODONE (DESYREL) 50 MG TABLET    Take 1 tablet (50 mg total) by mouth at bedtime as needed for sleep.   TRIAMCINOLONE CREAM (KENALOG) 0.1 %    Apply 1 Application topically 2 (two) times daily.   WARFARIN (COUMADIN) 2.5 MG TABLET    Take 5 mg (2 tablets) by mouth daily or as directed by HF clinic.   ZINC SULFATE 220 (50 ZN) MG TABS    Take 1 tablet (220 mg total) by mouth daily.  Modified Medications   No medications on file  Discontinued Medications   No medications on file    Subjective: 38 Y O male with HTN,  asthma, drug abuse, seizures, CHF s/p HMIII lVAD 07/29/23 with h/o MSSA lvad infeetion in Oct 2024 SP I&D x 3 discharged on 6 weeks of IV cefazolin,  EOT 11/18/23 , then hospitalized for driveline infection SP I&D with Cx + MSSA, Enterobacter cloacae, Stenotrophomonas maltophilia, E faecalis on cefazolin, PO lqn and minocylineand then plan to transition to PO regimen for suppression presents for hospital F/U   Today 12/27 : Discussed the use of AI scribe software for clinical note transcription with the patient, who gave verbal consent to proceed. Tolerating abx. . The patient is also on the path to a transplant, which is expected to take six to eight months.   The patient is also being seen by CTS , who has been swabbing the wound for cultures. The patient reports that the wound is healing well and there have been no new concerns.  Interim: cefaxolin stopped. At 1/22 visit with CTS, noted increased redness c/f corrolation with stopping iVabx . I discussed that I suspect likely anew organism as mino should cover mssa. Rx trial of cefadroxil 1 gm bid x 7 days 01/19/24: Reprots redness /wound around exit site is unchanged and not correlated with start/end of IV abx. No fevers or chills. His wife changes. Notes dry skin on eyelids and right shoulder. He notes he has a history of dry skin(knuckles) but more noticable now Review of Systems: Review of Systems  All other systems reviewed and are negative.   Past Medical History:  Diagnosis Date   Acid reflux    ADHD (attention deficit hyperactivity disorder)    Allergy    Asthma    Back pain    LVAD (left ventricular assist device) present (HCC)    Seizures (HCC)    Resolved    Social History   Tobacco Use   Smoking status: Some Days    Types: Cigarettes   Smokeless tobacco: Former    Types: Engineer, drilling   Vaping status: Former  Substance Use Topics   Alcohol use: Not Currently   Drug use: Not Currently    Comment: last use "many years  ago"    Family History  Problem Relation Age of Onset   Hypertension Mother        Living   Crohn's disease Maternal Grandmother    Congestive Heart Failure Maternal Grandmother    Crohn's disease Maternal Uncle    Hypertension Other        Maternal Aunts & Uncles    Allergies  Allergen Reactions   Chlorhexidine Dermatitis and Rash   Other Anaphylaxis    Tree Nuts   Peanuts [Peanut Oil] Anaphylaxis    Health Maintenance  Topic Date Due   COVID-19 Vaccine  Never done   Pneumococcal Vaccine 21-40 Years old (1 of 2 - PCV) Never done   INFLUENZA VACCINE  03/22/2024 (Originally 07/24/2023)   DTaP/Tdap/Td (5 - Tdap) 12/21/2024 (Originally 12/23/2013)   Hepatitis C Screening  Completed   HIV Screening  Completed   HPV VACCINES  Aged Out    Objective:  There were no vitals filed for this visit. There is no height or weight on file to calculate BMI.  Physical Exam Constitutional:      General: He is not in acute distress.    Appearance: He is normal weight. He is not toxic-appearing.  HENT:     Head: Normocephalic and atraumatic.     Right Ear: External ear normal.     Left Ear: External ear normal.     Nose: No congestion or rhinorrhea.     Mouth/Throat:     Mouth: Mucous membranes are moist.     Pharynx: Oropharynx is clear.  Eyes:     Extraocular Movements: Extraocular movements intact.     Conjunctiva/sclera: Conjunctivae normal.     Pupils: Pupils are equal, round, and reactive to light.  Cardiovascular:     Rate and Rhythm: Normal rate and regular rhythm.     Comments: LVAD Pulmonary:     Effort: Pulmonary effort is normal.     Breath sounds: Normal breath sounds.  Abdominal:     General: Abdomen is flat. Bowel sounds are normal.     Palpations: Abdomen is soft.  Musculoskeletal:        General: No swelling. Normal range of motion.     Cervical back: Normal range of motion and neck supple.  Skin:    General: Skin is warm and dry.  Neurological:      General: No focal deficit present.     Mental Status: He is oriented to person, place, and time.  Psychiatric:        Mood and Affect: Mood normal.      Lab Results Lab Results  Component Value Date   WBC 8.8 11/18/2023   HGB 13.9 11/18/2023   HCT 43.8 11/18/2023   MCV 83.3 11/18/2023   PLT 283 11/18/2023    Lab Results  Component Value Date   CREATININE 0.95 11/18/2023   BUN 20 11/18/2023   NA 136 11/18/2023   K 3.9 11/18/2023   CL 99 11/18/2023   CO2 26 11/18/2023    Lab Results  Component Value Date   ALT 11 11/18/2023   AST 21 11/18/2023   ALKPHOS 152 (H) 11/18/2023   BILITOT 0.5 11/18/2023    Lab Results  Component Value Date   CHOL 74 07/15/2023   HDL 16 (L) 07/15/2023   LDLCALC 30 07/15/2023   TRIG 140 07/15/2023   CHOLHDL 4.6 07/15/2023   Lab Results  Component Value Date   LABRPR NON REAC 09/26/2008   No results found for: "HIV1RNAQUANT", "HIV1RNAVL", "CD4TABS"   Problem List Items Addressed This Visit   None  Results          Assessment/Plan #Driveline Infection, polymicrobial #HX of MSSA dirveline infection -SP I&D with OR Cx+ MSSA, enterobacter clacae, steno, E faecalis -Completed IV cefazolin on 12/24/2023. -On suppressive oral Levaquin 500mg   and minocycline.  -Pt was seen by CTS last week. Concern for increased erythema at driveline site. Reached out to ID, I added cefadroxil x 7 days. Pt states, his wife changes his bandages. The erythema is unchanged  at has been present piro to abx being stopped. The suppresive regimen covers organisms isolated Plan: -Wound unchanged following completion of IV therapy.  -Continue suppressive lqn and minocycline as no concern for new erythema noted. -labs today.  -F/U in 2 months      #Midcaiton monitoring -Labs today       #Transplant Planning -Discussed smoking cessation -Continue current management and follow-up with transplant team.     [name], MD [facility] 01/19/2024, 2:39 PM   I have personally spent 45 minutes involved in face-to-face and non-face-to-face activities for this patient on the day of the visit. Professional time spent includes the following activities: Preparing to see the patient (review of tests), Obtaining and/or reviewing separately obtained history (admission/discharge record), Performing a medically appropriate examination and/or evaluation , Ordering medications/tests/procedures, referring and communicating with other health care professionals, Documenting clinical information in the EMR, Independently interpreting results (not separately reported), Communicating results to the patient/family/caregiver, Counseling and educating the patient/family/caregiver and Care coordination (not separately reported).

## 2024-01-20 ENCOUNTER — Other Ambulatory Visit (HOSPITAL_COMMUNITY): Payer: Self-pay | Admitting: *Deleted

## 2024-01-20 DIAGNOSIS — Z5181 Encounter for therapeutic drug level monitoring: Secondary | ICD-10-CM

## 2024-01-20 DIAGNOSIS — T827XXA Infection and inflammatory reaction due to other cardiac and vascular devices, implants and grafts, initial encounter: Secondary | ICD-10-CM

## 2024-01-20 DIAGNOSIS — I5022 Chronic systolic (congestive) heart failure: Secondary | ICD-10-CM

## 2024-01-20 DIAGNOSIS — Z7901 Long term (current) use of anticoagulants: Secondary | ICD-10-CM

## 2024-01-20 DIAGNOSIS — Z95811 Presence of heart assist device: Secondary | ICD-10-CM

## 2024-01-20 LAB — COMPLETE METABOLIC PANEL WITH GFR
AG Ratio: 1.7 (calc) (ref 1.0–2.5)
ALT: 12 U/L (ref 9–46)
AST: 20 U/L (ref 10–40)
Albumin: 4.1 g/dL (ref 3.6–5.1)
Alkaline phosphatase (APISO): 152 U/L — ABNORMAL HIGH (ref 36–130)
BUN: 17 mg/dL (ref 7–25)
CO2: 28 mmol/L (ref 20–32)
Calcium: 8.8 mg/dL (ref 8.6–10.3)
Chloride: 106 mmol/L (ref 98–110)
Creat: 0.97 mg/dL (ref 0.60–1.26)
Globulin: 2.4 g/dL (ref 1.9–3.7)
Glucose, Bld: 86 mg/dL (ref 65–99)
Potassium: 3.9 mmol/L (ref 3.5–5.3)
Sodium: 142 mmol/L (ref 135–146)
Total Bilirubin: 0.3 mg/dL (ref 0.2–1.2)
Total Protein: 6.5 g/dL (ref 6.1–8.1)
eGFR: 104 mL/min/{1.73_m2} (ref >=60)

## 2024-01-20 LAB — CBC WITH DIFFERENTIAL/PLATELET
Absolute Lymphocytes: 1443 {cells}/uL (ref 850–3900)
Absolute Monocytes: 587 {cells}/uL (ref 200–950)
Basophils Absolute: 20 {cells}/uL (ref 0–200)
Basophils Relative: 0.4 %
Eosinophils Absolute: 867 {cells}/uL — ABNORMAL HIGH (ref 15–500)
Eosinophils Relative: 17 %
HCT: 40.8 % (ref 38.5–50.0)
Hemoglobin: 13 g/dL — ABNORMAL LOW (ref 13.2–17.1)
MCH: 24.9 pg — ABNORMAL LOW (ref 27.0–33.0)
MCHC: 31.9 g/dL — ABNORMAL LOW (ref 32.0–36.0)
MCV: 78.2 fL — ABNORMAL LOW (ref 80.0–100.0)
MPV: 10.9 fL (ref 7.5–12.5)
Monocytes Relative: 11.5 %
Neutro Abs: 2183 {cells}/uL (ref 1500–7800)
Neutrophils Relative %: 42.8 %
Platelets: 253 10*3/uL (ref 140–400)
RBC: 5.22 10*6/uL (ref 4.20–5.80)
RDW: 16.7 % — ABNORMAL HIGH (ref 11.0–15.0)
Total Lymphocyte: 28.3 %
WBC: 5.1 10*3/uL (ref 3.8–10.8)

## 2024-01-20 LAB — SEDIMENTATION RATE: Sed Rate: 6 mm/h (ref 0–15)

## 2024-01-20 LAB — C-REACTIVE PROTEIN: CRP: <3 mg/L (ref <8.0)

## 2024-01-21 ENCOUNTER — Ambulatory Visit (HOSPITAL_COMMUNITY)
Admission: RE | Admit: 2024-01-21 | Discharge: 2024-01-21 | Disposition: A | Discharge status: Home / Self Care | Payer: Medicaid Other | Source: Ambulatory Visit | Attending: Cardiology | Admitting: Cardiology

## 2024-01-21 ENCOUNTER — Ambulatory Visit (HOSPITAL_COMMUNITY): Payer: Self-pay | Admitting: Pharmacist

## 2024-01-21 DIAGNOSIS — T827XXA Infection and inflammatory reaction due to other cardiac and vascular devices, implants and grafts, initial encounter: Secondary | ICD-10-CM | POA: Insufficient documentation

## 2024-01-21 DIAGNOSIS — Z4509 Encounter for adjustment and management of other cardiac device: Secondary | ICD-10-CM | POA: Diagnosis present

## 2024-01-21 DIAGNOSIS — Z95811 Presence of heart assist device: Secondary | ICD-10-CM | POA: Diagnosis not present

## 2024-01-21 DIAGNOSIS — Z7901 Long term (current) use of anticoagulants: Secondary | ICD-10-CM | POA: Insufficient documentation

## 2024-01-21 DIAGNOSIS — I5022 Chronic systolic (congestive) heart failure: Secondary | ICD-10-CM | POA: Diagnosis not present

## 2024-01-21 LAB — PROTIME-INR
INR: 3.2 — ABNORMAL HIGH (ref 0.8–1.2)
Prothrombin Time: 33.2 s — ABNORMAL HIGH (ref 11.4–15.2)

## 2024-01-21 NOTE — Addendum Note (Signed)
Encounter addended by: Bernita Raisin, RN on: 01/21/2024 3:49 PM  Actions taken: Order list changed, Diagnosis association updated

## 2024-01-21 NOTE — Progress Notes (Signed)
Patient presents for dressing change and INR in VAD Clinic today. Reports no problems with VAD equipment or concerns with drive line.  Pt completed IV Cefazolin. PICC line has been removed. He is currently is currently on  Levaquin 500 mg daily and Minocycline 200 mg BID. Added 7 days of Cefadroxil last week for increased redness at exit site. Redness has resolved. Had ID f/u 1/27- no changes at this time.  Kenalog cream made a big difference in his rash and itching. Rash resolved.   Exit Site Care: Drive line is being maintained daily by pt's wife Musician. Existing VAD dressing removed and site care performed using sterile technique. Outer edges of wound cleansed with betadine x 2, Vashe soaked 2 x 2 used to lightly debride wound bed, and gauze dressing with 2 VASHE moistened 4 x 4 placed directly on wound bed. Covered with 2 large tegaderms instead of tape. Site tunnels <1 cm. The velour is significantly exposed at the exit site. Scant amount of serosanguinous drainage noted on previous dressing with some slimy yellow drainage. Redness has resolved. No tenderness or rash noted. Anchor applied for increased stabilization of drive line over tegaderm. Pt denies fever or chills. Pt provided with 7 daily kits, 12 anchors, large bottle of Vashe, 4x4s, and 2x2s for home use.     Plan: Continue daily wet to dry dressing changes using VASHE solution Coumadin dosing per [facility] Return to VAD clinic in 2 week for full visit   Alyce Pagan RN VAD Coordinator  Office: [phone]  24/7 Pager: [phone]

## 2024-01-25 LAB — AEROBIC CULTURE W GRAM STAIN (SUPERFICIAL SPECIMEN)
Culture: NO GROWTH
Gram Stain: NONE SEEN

## 2024-01-26 ENCOUNTER — Encounter (HOSPITAL_COMMUNITY): Payer: Medicaid Other | Admitting: Internal Medicine

## 2024-01-30 DIAGNOSIS — Z7901 Long term (current) use of anticoagulants: Secondary | ICD-10-CM | POA: Diagnosis not present

## 2024-01-30 DIAGNOSIS — Z95811 Presence of heart assist device: Secondary | ICD-10-CM | POA: Diagnosis not present

## 2024-01-30 DIAGNOSIS — I5022 Chronic systolic (congestive) heart failure: Secondary | ICD-10-CM | POA: Diagnosis not present

## 2024-02-04 ENCOUNTER — Encounter (HOSPITAL_COMMUNITY): Payer: Medicaid Other | Admitting: Cardiology

## 2024-02-11 ENCOUNTER — Ambulatory Visit (HOSPITAL_COMMUNITY): Payer: Self-pay | Admitting: Pharmacist

## 2024-02-11 ENCOUNTER — Ambulatory Visit (HOSPITAL_COMMUNITY)
Admission: RE | Admit: 2024-02-11 | Discharge: 2024-02-11 | Disposition: A | Discharge status: Home / Self Care | Payer: Medicaid Other | Source: Ambulatory Visit | Attending: Cardiology | Admitting: Cardiology

## 2024-02-11 ENCOUNTER — Encounter (HOSPITAL_COMMUNITY): Payer: Self-pay | Admitting: Cardiology

## 2024-02-11 VITALS — BP 84/0 | HR 124 | Wt 154.4 lb

## 2024-02-11 DIAGNOSIS — Z5181 Encounter for therapeutic drug level monitoring: Secondary | ICD-10-CM | POA: Insufficient documentation

## 2024-02-11 DIAGNOSIS — R21 Rash and other nonspecific skin eruption: Secondary | ICD-10-CM | POA: Insufficient documentation

## 2024-02-11 DIAGNOSIS — Z95811 Presence of heart assist device: Secondary | ICD-10-CM | POA: Insufficient documentation

## 2024-02-11 DIAGNOSIS — T827XXA Infection and inflammatory reaction due to other cardiac and vascular devices, implants and grafts, initial encounter: Secondary | ICD-10-CM | POA: Diagnosis not present

## 2024-02-11 DIAGNOSIS — Y831 Surgical operation with implant of artificial internal device as the cause of abnormal reaction of the patient, or of later complication, without mention of misadventure at the time of the procedure: Secondary | ICD-10-CM | POA: Insufficient documentation

## 2024-02-11 DIAGNOSIS — Z7901 Long term (current) use of anticoagulants: Secondary | ICD-10-CM | POA: Diagnosis not present

## 2024-02-11 DIAGNOSIS — I499 Cardiac arrhythmia, unspecified: Secondary | ICD-10-CM

## 2024-02-11 DIAGNOSIS — I5022 Chronic systolic (congestive) heart failure: Secondary | ICD-10-CM | POA: Insufficient documentation

## 2024-02-11 LAB — CBC
HCT: 51.5 % (ref 39.0–52.0)
Hemoglobin: 16 g/dL (ref 13.0–17.0)
MCH: 24.5 pg — ABNORMAL LOW (ref 26.0–34.0)
MCHC: 31.1 g/dL (ref 30.0–36.0)
MCV: 78.7 fL — ABNORMAL LOW (ref 80.0–100.0)
Platelets: 345 10*3/uL (ref 150–400)
RBC: 6.54 MIL/uL — ABNORMAL HIGH (ref 4.22–5.81)
RDW: 19.6 % — ABNORMAL HIGH (ref 11.5–15.5)
WBC: 9.2 10*3/uL (ref 4.0–10.5)
nRBC: 0 % (ref 0.0–0.2)

## 2024-02-11 LAB — COMPREHENSIVE METABOLIC PANEL
ALT: 19 U/L (ref 0–44)
AST: 28 U/L (ref 15–41)
Albumin: 4.2 g/dL (ref 3.5–5.0)
Alkaline Phosphatase: 140 U/L — ABNORMAL HIGH (ref 38–126)
Anion gap: 13 (ref 5–15)
BUN: 14 mg/dL (ref 6–20)
CO2: 25 mmol/L (ref 22–32)
Calcium: 9.7 mg/dL (ref 8.9–10.3)
Chloride: 104 mmol/L (ref 98–111)
Creatinine, Ser: 1.08 mg/dL (ref 0.61–1.24)
GFR, Estimated: >60 mL/min (ref >60)
Glucose, Bld: 117 mg/dL — ABNORMAL HIGH (ref 70–99)
Potassium: 3.8 mmol/L (ref 3.5–5.1)
Sodium: 142 mmol/L (ref 135–145)
Total Bilirubin: 0.7 mg/dL (ref 0.0–1.2)
Total Protein: 8.2 g/dL — ABNORMAL HIGH (ref 6.5–8.1)

## 2024-02-11 LAB — LACTATE DEHYDROGENASE: LDH: 219 U/L — ABNORMAL HIGH (ref 98–192)

## 2024-02-11 LAB — DIGOXIN LEVEL: Digoxin Level: <0.2 ng/mL — ABNORMAL LOW (ref 0.8–2.0)

## 2024-02-11 LAB — PROTIME-INR
INR: 1.4 — ABNORMAL HIGH (ref 0.8–1.2)
Prothrombin Time: 17 s — ABNORMAL HIGH (ref 11.4–15.2)

## 2024-02-11 LAB — PREALBUMIN: Prealbumin: 29 mg/dL (ref 18–38)

## 2024-02-11 MED ORDER — VARENICLINE TARTRATE (STARTER) 0.5 MG X 11 & 1 MG X 42 PO TBPK: ORAL_TABLET | ORAL | 3 refills | Status: DC

## 2024-02-11 NOTE — Addendum Note (Signed)
 Encounter addended by: Flora Lipps, RN on: 02/11/2024 1:26 PM  Actions taken: Vitals modified, Flowsheet accepted, Clinical Note Signed, Charge Capture section accepted

## 2024-02-11 NOTE — Progress Notes (Signed)
 Patient presents for his 2 month follow up in VAD Clinic today alone. Reports no problems with VAD equipment or concerns with drive line.  Pt tells me that he has been doing well. Denies lightheadedness, dizziness, falls, shortness of breath, and signs of bleeding. He endorses taking all medications as prescribed.    Pts wife Fatima Blank continues to do his driveline dressing change daily. Reports minimal drainage. See dressing change details below. Pt advanced to twice weekly dressing changes today. Will see pt back next week to see if we can further advance.  Pt is currently on chronic suppressive Minocycline and Levaquin. Pt confirms that he is taking these meds. He continues to follow with ID. Pt continues to have systemic rash. Currently taking Zytrec and Atarax daily. Continues to use Kenolog cream as needed. Dermatology referral placed today per Dr. Gasper Lloyd.   EKG obtained today. Pt sinus tach with a rate of 114bpm. Dr. Gasper Lloyd reviewed. Pt states he has not taken his morning medication yet this morning.   Pt asking for assistance with smoking cessation. Discussed with Dr. Gasper Lloyd. Orders placed for Chantix taper.    Vital Signs:  Doppler Pressure: 84   Automatc BP: 98/79 (87) HR: 124 SPO2: 100 % RA   Weight: 154.4 lb w/ eqt Last weight: 153.8 lb w/ eqt  VAD Indication: Destination Therapy due to recent smoking    VAD interrogation & Equipment Management: Speed: 5600 Flow: 4.2 Power: 4.3 w    PI: 3.8   Alarms: none Events: 10 - 20 daily  Fixed speed 5600 Low speed limit: 5300   Primary Controller:  Replace back up battery in 22 months. Back up controller:   Replace back up battery in 23 months.   Annual Equipment Maintenance on UBC/PM was performed on 07/30/23.    I reviewed the LVAD parameters from today and compared the results to the patient's prior recorded data. LVAD interrogation was NEGATIVE for significant power changes, NEGATIVE for clinical alarms and STABLE  for PI events/speed drops. No programming changes were made and pump is functioning within specified parameters. Pt is performing daily controller and system monitor self tests along with completing weekly and monthly maintenance for LVAD equipment.   LVAD equipment check completed and is in good working order. Back-up equipment present. Charged back up battery and performed self-test on equipment.    Exit Site Care: Drive line is being maintained daily by pt's wife Musician.  Existing VAD dressing removed and site care performed using sterile technique. Exit site cleansed with betadine x 2, Vashe soaked 2 x 2 used to cleanse exit site. 2 VASHE moistened 2 x 2's placed directly on exit site. Covered with 2 large tegaderms instead of tape. Site does not tunnel. The velour is significantly exposed at the exit site. Mild drainage. Mild rash still present under previous tape sites. Cath grip anchor applied for increased stabilization of drive line.  Pt denies fever or chills.  Advance to twice weekly dressing changes. Provided gauze, betadine, gloves, cotton tip applicators and VASHE 34oz bottle for home use.        Significant Events on VAD Support:    Device: n/a    BP & Labs:  MAP 84 - Doppler is reflecting MAP   Hgb 16 - No S/S of bleeding. Specifically denies melena/BRBPR or nosebleeds.   LDH stable at 219 with established baseline of 200-348. Denies tea-colored urine. No power elevations noted on interrogation.   6 mo. Intermacs follow up completed including:  Quality of Life, KCCQ-12, and Neurocognitive trail making.   Pt refused today due to inclement weather. Will perform next week.  [place] Cardiomyopathy Questionnaire     02/11/2024    1:10 PM 11/18/2023    2:32 PM  KCCQ-12  1 a. Ability to shower/bathe Other, Did not do Quite a bit limited  1 b. Ability to walk 1 block Not at all limited Not at all limited  1 c. Ability to hurry/jog Moderately limited Moderately  limited  2. Edema feet/ankles/legs Never over the past 2 weeks Never over the past 2 weeks  3. Limited by fatigue 1-2 times a week 3+ times per week, not every day  4. Limited by dyspnea Less than once a week At least once a day  5. Sitting up / on 3+ pillows Never over the past 2 weeks Never over the past 2 weeks  6. Limited enjoyment of life Not limited at all Moderately limited  7. Rest of life w/ symptoms Somewhat satisfied Not at all satisfied  8 a. Participation in hobbies Limited quite a bit Moderately limited  8 b. Participation in chores Limited quite a bit Moderately limited  8 c. Visiting family/friends Slightly limited Moderately limited     Patient Goals: Be listed for heart transplant   Plan: Start Chantix taper for smoking cessation Dermatology referral placed Return 1 week for drive line check to advance Return 2 month to see Dr. Gala Romney Coumadin dosing per Leot[facility] Sh[facility]  Simmie Davies RN,BSN VAD Coordinator  Office: [phone]  24/7 Pager: [phone]

## 2024-02-11 NOTE — Addendum Note (Signed)
 Encounter addended by: Flora Lipps, RN on: 02/11/2024 12:26 PM  Actions taken: Visit diagnoses modified, Order list changed, Diagnosis association updated, Clinical Note Signed

## 2024-02-11 NOTE — Patient Instructions (Signed)
 Start Chantix taper for smoking cessation Dermatology referral placed Return 1 week for drive line check to advance Return 2 month to see Dr. Gala Romney Coumadin dosing per Leotis Shames [facility]

## 2024-02-12 ENCOUNTER — Other Ambulatory Visit (HOSPITAL_COMMUNITY): Payer: Self-pay | Admitting: *Deleted

## 2024-02-12 DIAGNOSIS — I5022 Chronic systolic (congestive) heart failure: Secondary | ICD-10-CM

## 2024-02-12 DIAGNOSIS — Z7901 Long term (current) use of anticoagulants: Secondary | ICD-10-CM

## 2024-02-12 DIAGNOSIS — Z95811 Presence of heart assist device: Secondary | ICD-10-CM

## 2024-02-13 ENCOUNTER — Encounter: Payer: Self-pay | Admitting: *Deleted

## 2024-02-13 MED ORDER — VARENICLINE TARTRATE (STARTER) 0.5 MG X 11 & 1 MG X 42 PO TBPK: ORAL_TABLET | ORAL | 3 refills | Status: DC

## 2024-02-16 ENCOUNTER — Ambulatory Visit (HOSPITAL_COMMUNITY): Payer: Self-pay | Admitting: Pharmacist

## 2024-02-16 LAB — POCT INR: INR: 2.1 (ref 2.0–3.0)

## 2024-02-17 ENCOUNTER — Other Ambulatory Visit (HOSPITAL_COMMUNITY): Payer: Self-pay | Admitting: Unknown Physician Specialty

## 2024-02-17 MED ORDER — VARENICLINE TARTRATE (STARTER) 0.5 MG X 11 & 1 MG X 42 PO TBPK: ORAL_TABLET | ORAL | 3 refills | Status: AC

## 2024-02-19 ENCOUNTER — Ambulatory Visit (HOSPITAL_COMMUNITY)
Admission: RE | Admit: 2024-02-19 | Discharge: 2024-02-19 | Disposition: A | Discharge status: Home / Self Care | Payer: Medicaid Other | Source: Ambulatory Visit | Attending: Cardiology | Admitting: Cardiology

## 2024-02-19 DIAGNOSIS — Z4801 Encounter for change or removal of surgical wound dressing: Secondary | ICD-10-CM | POA: Diagnosis not present

## 2024-02-19 DIAGNOSIS — Z95811 Presence of heart assist device: Secondary | ICD-10-CM | POA: Insufficient documentation

## 2024-02-19 NOTE — Patient Instructions (Addendum)
 Advance to weekly dressing changes using weekly kit. May shower. MUST CHANGE DRESSING AFTER SHOWERING.  Provided patient with shower bag for home use. Demonstration along with written instructions and illustrated steps provided.  Patient and caregiver verbalized understanding of same.    Here are some tips for washing:  Avoid getting the driveline exit site dressing wet, and consider planning bathing times around exit site dressing changes. Use only the shower bag we gave you to shower with and don't get creative with your equipment to shower. Water and electricity do not mix and will cause your pump to stop.   Sit on a chair or bathing stool in the bathtub or shower stall, and use a basin of warm water and washcloth or sponge to wash. Or, wash at the sink while standing on a towel, so as not to get the floor or bath rug wet. To wash your hair, try using a hand-held shower wand or sprayer while standing over the kitchen sink or bathtub. Be sure that all floor surfaces are dry when walking around after bathing, to avoid slipping. Do not use powder around the exit site dressing. Anytime your dressing appears wet CHANGE IT! Drink 2 large glasses of water prior to getting in shower for first time (always hydrate before shower). Caregiver needs to be accessible during first few showers. Call VAD Coordinator if any changes in appearance of drive line site.

## 2024-02-19 NOTE — Addendum Note (Signed)
 Encounter addended by: Lebron Quam, RN on: 02/19/2024 2:32 PM  Actions taken: Clinical Note Signed

## 2024-02-19 NOTE — Progress Notes (Addendum)
 Pt presents to clinic for driveline check for possible advancement today alone.  Drive line is being maintained daily by pt's wife Musician.  Existing VAD dressing removed and site care performed using sterile technique. Exit site cleansed with Vashe x 2, allowed to dry, and Sorbaview dressing with Silverlon patch applied. Exit site healed and incorporated, the velour is significantly at exit site. No redness, tenderness, drainage, foul odor or rash noted. Pt denies fever or chills.  Advance to weekly dressing changes using weekly. May shower. Provided with 16 weekly dressings.    Plan:  Advance to weekly dressing changes using weekly kit. May shower. MUST CHANGE DRESSING AFTER SHOWERING.  Provided patient with shower bag for home use. Demonstration along with written instructions and illustrated steps provided.  Patient and caregiver verbalized understanding of same.    Here are some tips for washing:  Avoid getting the driveline exit site dressing wet, and consider planning bathing times around exit site dressing changes. Use only the shower bag we gave you to shower with and don't get creative with your equipment to shower. Water and electricity do not mix and will cause your pump to stop.   Sit on a chair or bathing stool in the bathtub or shower stall, and use a basin of warm water and washcloth or sponge to wash. Or, wash at the sink while standing on a towel, so as not to get the floor or bath rug wet. To wash your hair, try using a hand-held shower wand or sprayer while standing over the kitchen sink or bathtub. Be sure that all floor surfaces are dry when walking around after bathing, to avoid slipping. Do not use powder around the exit site dressing. Anytime your dressing appears wet CHANGE IT! Drink 2 large glasses of water prior to getting in shower for first time (always hydrate before shower). Caregiver needs to be accessible during first few showers. Call VAD Coordinator if any  changes in appearance of drive line site. Clean showerhead with bleach prior to EVERY shower.

## 2024-02-24 ENCOUNTER — Ambulatory Visit (HOSPITAL_COMMUNITY): Payer: Self-pay | Admitting: Pharmacist

## 2024-02-24 LAB — POCT INR: INR: 1.3 — AB (ref 2.0–3.0)

## 2024-03-01 ENCOUNTER — Ambulatory Visit (HOSPITAL_COMMUNITY): Payer: Self-pay | Admitting: Pharmacist

## 2024-03-01 LAB — POCT INR: INR: 2.6 (ref 2.0–3.0)

## 2024-03-09 ENCOUNTER — Telehealth (HOSPITAL_COMMUNITY): Payer: Self-pay | Admitting: Licensed Clinical Social Worker

## 2024-03-09 ENCOUNTER — Ambulatory Visit (HOSPITAL_COMMUNITY): Payer: Self-pay | Admitting: Pharmacist

## 2024-03-09 LAB — POCT INR: INR: 2.2 (ref 2.0–3.0)

## 2024-03-09 NOTE — Telephone Encounter (Signed)
 CSW contacted patient to share about the upcoming LVAD Men's Group on Thursday March 27 at 1:30 pm in the Heart and Vascular Conference Room. CSW left message for return call. Lasandra Beech, LCSW, CCSW-MCS [phone]

## 2024-03-13 ENCOUNTER — Other Ambulatory Visit (HOSPITAL_BASED_OUTPATIENT_CLINIC_OR_DEPARTMENT_OTHER): Payer: Self-pay

## 2024-03-16 ENCOUNTER — Ambulatory Visit (HOSPITAL_COMMUNITY): Payer: Self-pay | Admitting: Pharmacist

## 2024-03-16 LAB — POCT INR: INR: 2.5 (ref 2.0–3.0)

## 2024-03-24 ENCOUNTER — Ambulatory Visit (HOSPITAL_COMMUNITY): Payer: Self-pay | Admitting: Pharmacist

## 2024-03-24 ENCOUNTER — Other Ambulatory Visit (HOSPITAL_COMMUNITY): Payer: Self-pay | Admitting: Unknown Physician Specialty

## 2024-03-24 DIAGNOSIS — Z95811 Presence of heart assist device: Secondary | ICD-10-CM

## 2024-03-24 DIAGNOSIS — Z7901 Long term (current) use of anticoagulants: Secondary | ICD-10-CM

## 2024-03-24 LAB — POCT INR: INR: 1.8 — AB (ref 2.0–3.0)

## 2024-03-25 ENCOUNTER — Ambulatory Visit (HOSPITAL_COMMUNITY)
Admission: RE | Admit: 2024-03-25 | Discharge: 2024-03-25 | Disposition: A | Discharge status: Home / Self Care | Payer: Medicaid Other | Source: Ambulatory Visit | Attending: Internal Medicine | Admitting: Internal Medicine

## 2024-03-25 ENCOUNTER — Ambulatory Visit (HOSPITAL_COMMUNITY): Payer: Self-pay | Admitting: Pharmacist

## 2024-03-25 VITALS — BP 114/0 | HR 113 | Ht 67.0 in | Wt 156.0 lb

## 2024-03-25 DIAGNOSIS — F17210 Nicotine dependence, cigarettes, uncomplicated: Secondary | ICD-10-CM | POA: Insufficient documentation

## 2024-03-25 DIAGNOSIS — R569 Unspecified convulsions: Secondary | ICD-10-CM | POA: Insufficient documentation

## 2024-03-25 DIAGNOSIS — Z720 Tobacco use: Secondary | ICD-10-CM | POA: Diagnosis not present

## 2024-03-25 DIAGNOSIS — I428 Other cardiomyopathies: Secondary | ICD-10-CM | POA: Diagnosis not present

## 2024-03-25 DIAGNOSIS — Z95811 Presence of heart assist device: Secondary | ICD-10-CM | POA: Diagnosis not present

## 2024-03-25 DIAGNOSIS — Z79899 Other long term (current) drug therapy: Secondary | ICD-10-CM | POA: Insufficient documentation

## 2024-03-25 DIAGNOSIS — Z7901 Long term (current) use of anticoagulants: Secondary | ICD-10-CM | POA: Diagnosis not present

## 2024-03-25 DIAGNOSIS — J45909 Unspecified asthma, uncomplicated: Secondary | ICD-10-CM | POA: Insufficient documentation

## 2024-03-25 DIAGNOSIS — I110 Hypertensive heart disease with heart failure: Secondary | ICD-10-CM | POA: Diagnosis not present

## 2024-03-25 DIAGNOSIS — Y838 Other surgical procedures as the cause of abnormal reaction of the patient, or of later complication, without mention of misadventure at the time of the procedure: Secondary | ICD-10-CM | POA: Insufficient documentation

## 2024-03-25 DIAGNOSIS — I5022 Chronic systolic (congestive) heart failure: Secondary | ICD-10-CM | POA: Insufficient documentation

## 2024-03-25 DIAGNOSIS — I493 Ventricular premature depolarization: Secondary | ICD-10-CM | POA: Insufficient documentation

## 2024-03-25 DIAGNOSIS — Z4801 Encounter for change or removal of surgical wound dressing: Secondary | ICD-10-CM | POA: Insufficient documentation

## 2024-03-25 DIAGNOSIS — T827XXA Infection and inflammatory reaction due to other cardiac and vascular devices, implants and grafts, initial encounter: Secondary | ICD-10-CM | POA: Diagnosis not present

## 2024-03-25 LAB — CBC
HCT: 43.6 % (ref 39.0–52.0)
Hemoglobin: 14.2 g/dL (ref 13.0–17.0)
MCH: 25.8 pg — ABNORMAL LOW (ref 26.0–34.0)
MCHC: 32.6 g/dL (ref 30.0–36.0)
MCV: 79.3 fL — ABNORMAL LOW (ref 80.0–100.0)
Platelets: 204 10*3/uL (ref 150–400)
RBC: 5.5 MIL/uL (ref 4.22–5.81)
RDW: 19 % — ABNORMAL HIGH (ref 11.5–15.5)
WBC: 7.1 10*3/uL (ref 4.0–10.5)
nRBC: 0 % (ref 0.0–0.2)

## 2024-03-25 LAB — BASIC METABOLIC PANEL WITH GFR
Anion gap: 8 (ref 5–15)
BUN: 10 mg/dL (ref 6–20)
CO2: 28 mmol/L (ref 22–32)
Calcium: 8.8 mg/dL — ABNORMAL LOW (ref 8.9–10.3)
Chloride: 106 mmol/L (ref 98–111)
Creatinine, Ser: 1.02 mg/dL (ref 0.61–1.24)
GFR, Estimated: >60 mL/min (ref >60)
Glucose, Bld: 96 mg/dL (ref 70–99)
Potassium: 3.8 mmol/L (ref 3.5–5.1)
Sodium: 142 mmol/L (ref 135–145)

## 2024-03-25 LAB — PROTIME-INR
INR: 1.2 (ref 0.8–1.2)
Prothrombin Time: 15.8 s — ABNORMAL HIGH (ref 11.4–15.2)

## 2024-03-25 LAB — LACTATE DEHYDROGENASE: LDH: 161 U/L (ref 98–192)

## 2024-03-25 NOTE — Patient Instructions (Signed)
 No change in medications  Return to clinic in 2 months

## 2024-03-25 NOTE — Progress Notes (Signed)
 LVAD CLINIC NOTE  PCP: Modesto Charon, NP HF Cardiologist: DB  HPI:  Joel York is a 38 y.o. male with HTN, ADD, asthma, hx drug abuse (now on suboxone x5 yrs), and seizures. Admitted in 7/27 with acute systolic heart failure -> shock. EF 10%. 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   - LHC with normal coronaries. RHC 07/24: Low CO (Fick CO/CI 3.3/1.9) and PAPi 3.0 on milrinone 0.375 . Off NE - cMRI:  LV markedly dilated EF 10% RV 17% NICM  Admitted 09/29/23 with DL infection. Wound cx + staph aureus. Underwent I&D/wound vac change x3. ID saw, discharged home on 6 weeks of IV cefazolin (end date 11/18/23).   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 Underwent repeat I&D. Wound culture which showed stenotrophomas, staph aureus, and entercoccus cloacea.   Presents to VAD Clinic for f/u. Doing very well. Active. Still smoking a few cigarettes but about to start Chantix. Wife doing dressing changes. Denies orthopnea or PND. No fevers, chills or problems with driveline. No bleeding, melena or neuro symptoms. No VAD alarms. Taking all meds as prescribed.    VAD Indication: Destination Therapy due to recent smoking    VAD interrogation & Equipment Management: Speed: 5600 Flow: 3.7 Power: 4.5 w    PI: 8.3   Alarms: none Events: 20-30 daily  Fixed speed 5600 Low speed limit: 5300   Primary Controller:  Replace back up battery in 19 months. Back up controller:   Replace back up battery in 20 months.   Annual Equipment Maintenance on UBC/PM was performed on 07/30/23.    I reviewed the LVAD parameters from today and compared the results to the patient's prior recorded data. LVAD interrogation was NEGATIVE for significant power changes, NEGATIVE for clinical alarms and STABLE for PI events/speed drops. No programming changes were made and pump is functioning within specified parameters. Pt is performing daily controller and system monitor self tests along with completing weekly and  monthly maintenance for LVAD equipment.   LVAD equipment check completed and is in good working order. Back-up  Past Medical History:  Diagnosis Date   Acid reflux    ADHD (attention deficit hyperactivity disorder)    Allergy    Asthma    Back pain    LVAD (left ventricular assist device) present (HCC)    Seizures (HCC)    Resolved    Current Outpatient Medications  Medication Sig Dispense Refill   cetirizine (ZYRTEC) 10 MG tablet Take 1 tablet (10 mg total) by mouth at bedtime. 30 tablet 6   digoxin (LANOXIN) 0.125 MG tablet Take 1 tablet (0.125 mg total) by mouth daily. 30 tablet 6   gabapentin (NEURONTIN) 300 MG capsule Take 1 capsule (300 mg total) by mouth 3 (three) times daily. (Patient taking differently: Take 300 mg by mouth 2 (two) times daily.) 90 capsule 6   hydrOXYzine (ATARAX) 25 MG tablet Take 1 tablet (25 mg total) by mouth 3 (three) times daily as needed for itching. 30 tablet 0   ivabradine (CORLANOR) 5 MG TABS tablet Take 1 tablet (5 mg total) by mouth 2 (two) times daily with a meal. 60 tablet 11   levofloxacin (LEVAQUIN) 250 MG tablet Take 2 tablets (500 mg total) by mouth daily. 60 tablet 5   losartan (COZAAR) 25 MG tablet Take 1 tablet (25 mg total) by mouth 2 (two) times daily. 60 tablet 6   minocycline (MINOCIN) 100 MG capsule Take 2 capsules (200 mg total) by mouth 2 (two)  times daily. 120 capsule 4   sertraline (ZOLOFT) 25 MG tablet Take 1 tablet (25 mg total) by mouth daily. 30 tablet 6   spironolactone (ALDACTONE) 25 MG tablet Take 1 tablet (25 mg total) by mouth daily. 30 tablet 5   SUBOXONE 8-2 MG FILM Place 1 Film under the tongue daily.     warfarin (COUMADIN) 2.5 MG tablet Take 5 mg (2 tablets) by mouth daily or as directed by HF clinic. 75 tablet 5   albuterol (VENTOLIN HFA) 108 (90 Base) MCG/ACT inhaler Inhale 2 puffs into the lungs every 6 (six) hours as needed for wheezing or shortness of breath. Shortness of breath (Patient not taking: Reported on  03/25/2024) 1 each 6   pantoprazole (PROTONIX) 40 MG tablet Take 1 tablet (40 mg total) by mouth daily. 30 tablet 6   traZODone (DESYREL) 50 MG tablet Take 1 tablet (50 mg total) by mouth at bedtime as needed for sleep. (Patient not taking: Reported on 03/25/2024) 30 tablet 6   triamcinolone cream (KENALOG) 0.1 % Apply 1 Application topically 2 (two) times daily. (Patient not taking: Reported on 03/25/2024) 30 g 3   Varenicline Tartrate, Starter, (CHANTIX STARTING MONTH PAK) 0.5 MG X 11 & 1 MG X 42 TBPK Days 1 to 3: 0.5 mg once daily. Days 4 to 7: 0.5 mg twice daily. Maintenance (day 8 and later): 1 mg twice daily (Patient not taking: Reported on 03/25/2024) 90 each 3   Zinc Sulfate 220 (50 Zn) MG TABS Take 1 tablet (220 mg total) by mouth daily. (Patient not taking: Reported on 03/25/2024) 30 tablet 5   No current facility-administered medications for this encounter.   Vital Signs:  Doppler Pressure: 114                       Automatc BP: 127/98 (108) HR: 113 SPO2: 97% RA   Weight: 156 lb w/ eqt Last weight: 154.4 lb w/ eqt     Physical Exam: General:  NAD.  HEENT: normal  Neck: supple. JVP not elevated.  Carotids 2+ bilat; no bruits. No lymphadenopathy or thryomegaly appreciated. Cor: LVAD hum.  Lungs: Clear. Abdomen:  soft, nontender, non-distended. No hepatosplenomegaly. No bruits or masses. Good bowel sounds. Driveline site clean. Anchor in place.  Extremities: no cyanosis, clubbing, rash. Warm no edema  Neuro: alert & oriented x 3. No focal deficits. 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 Underwent repeat I&D. Wound culture which showed stenotrophomas, staph aureus, and entercoccus cloacea.  - Resolved. - On levaquin + minocycline for suppression.  - ID following    2. Chronic biventricular systolic heart failure - Admitted 7/24 NYHA IV symptoms .Echo EF <20%,  RV mildly reduced, - Etiology  uncertain. ? 2/2 PVCs vs genetically mediated +/- hypertension.  - LHC with normal coronaries.  - cMRI:  LV markedly dilated EF 10% RV 17% NICM - Underwent HM-III VAD on 07/28/25 - Doing well - NYHA I - Volume ok  - Continue digoxin 0.125 mg daily for RV support - Continue losartan 25 mg BID - Continue ivabradine 5 bid - HR improved - Discussed timing of transplant referral (needs to quit smoking)  3. S/P HMIII LVAD  on 07/29/23 - VAD interrogated personally. Parameters stable. - MAPs high today but did not take mades yet this am - LDH 161 - Hgb 14.2 - INR 1.2 Goal  2.0-3.0  Discussed warfarin dosing with PharmD personally. - Off ASA - DL management as above   4. PVCs - Now off mexilitene - watch for recurrence   5. Tobacco use - still smoking a few per day - discussed need for cessation  About to start Chantic   6. Hx drug abuse - Continue suboxone - no change  I spent a total of 42 minutes today: 1) reviewing the patient's medical records including previous charts, labs and recent notes from other providers; 2) examining the patient and counseling them on their medical issues/explaining the plan of care; 3) adjusting meds as needed and 4) ordering lab work or other needed tests.    [name], MD  5:28 PM

## 2024-03-25 NOTE — Progress Notes (Signed)
 Patient presents for his 2 month follow up in VAD Clinic today alone. Reports no problems with VAD equipment or concerns with drive line.  Pt tells me that he has been doing well. Denies lightheadedness, dizziness, falls, shortness of breath, and signs of bleeding. He endorses taking all medications as prescribed.    Pts wife Joel York continues to do his driveline dressing change every couple of days. Reports minimal drainage. See dressing change details below.   Pt is currently on chronic suppressive Minocycline and Levaquin. Pt confirms that he is taking these meds. He continues to follow with ID. Currently taking Zytrec and Atarax daily. No longer using Kenolog cream.   Pt has not started Chantix and continues to smoke.     Vital Signs:  Doppler Pressure: 114   Automatc BP: 127/98 (108) HR: 113 SPO2: 97% RA   Weight: 156 lb w/ eqt Last weight: 154.4 lb w/ eqt  VAD Indication: Destination Therapy due to recent smoking    VAD interrogation & Equipment Management: Speed: 5600 Flow: 3.7 Power: 4.5 w    PI: 8.3   Alarms: none Events: 20 - 30 daily  Fixed speed 5600 Low speed limit: 5300   Primary Controller:  Replace back up battery in 19 months. Back up controller:   Replace back up battery in 20 months.   Annual Equipment Maintenance on UBC/PM was performed on 07/30/23.    I reviewed the LVAD parameters from today and compared the results to the patient's prior recorded data. LVAD interrogation was NEGATIVE for significant power changes, NEGATIVE for clinical alarms and STABLE for PI events/speed drops. No programming changes were made and pump is functioning within specified parameters. Pt is performing daily controller and system monitor self tests along with completing weekly and monthly maintenance for LVAD equipment.   LVAD equipment check completed and is in good working order. Back-up equipment present. Charged back up battery and performed self-test on equipment.     Exit Site Care: Drive line is being maintained daily by pt's wife Musician. Existing VAD dressing removed and site care performed using sterile technique. Exit site cleansed with Vashe x 2, allowed to dry, and Sorbaview dressing with Vashe moistened 2 x 2 covering driveline. Exit site healed and incorporated, the velour is significantly at exit site. Scant brown drainage. No redness, tenderness, foul odor or rash noted. Pt denies fever or chills.  Continue weekly dressings.  Provided weekly dressing kits, tegaderms and anchors for home use.        Significant Events on VAD Support:    Device: n/a    BP & Labs:  MAP 114 - Doppler is reflecting MAP   Hgb 14.2 - No S/S of bleeding. Specifically denies melena/BRBPR or nosebleeds.   LDH stable at 161 with established baseline of 200-348. Denies tea-colored urine. No power elevations noted on interrogation.   Plan: No change in medications Return 2 month to see Dr. Gala Romney Coumadin dosing per [facility]  Carlton Adam RN,BSN VAD Coordinator  Office: [phone]  24/7 Pager: [phone]

## 2024-03-31 ENCOUNTER — Ambulatory Visit (HOSPITAL_COMMUNITY): Payer: Self-pay | Admitting: Pharmacist

## 2024-03-31 LAB — POCT INR: INR: 1.8 — AB (ref 2.0–3.0)

## 2024-03-31 MED ORDER — WARFARIN SODIUM 5 MG PO TABS: ORAL_TABLET | ORAL | 11 refills | Status: AC

## 2024-04-07 ENCOUNTER — Ambulatory Visit (HOSPITAL_COMMUNITY): Payer: Self-pay | Admitting: Pharmacist

## 2024-04-07 LAB — POCT INR: INR: 1.9 — AB (ref 2.0–3.0)

## 2024-04-15 ENCOUNTER — Ambulatory Visit (HOSPITAL_COMMUNITY): Payer: Self-pay | Admitting: Pharmacist

## 2024-04-15 LAB — POCT INR: INR: 1.7 — AB (ref 2.0–3.0)

## 2024-04-17 ENCOUNTER — Other Ambulatory Visit: Payer: Self-pay

## 2024-04-18 ENCOUNTER — Other Ambulatory Visit: Payer: Self-pay

## 2024-04-19 ENCOUNTER — Other Ambulatory Visit: Payer: Self-pay

## 2024-04-21 ENCOUNTER — Ambulatory Visit (HOSPITAL_COMMUNITY): Payer: Self-pay | Admitting: Pharmacist

## 2024-04-21 LAB — POCT INR: INR: 2.1 (ref 2.0–3.0)

## 2024-04-29 ENCOUNTER — Ambulatory Visit (HOSPITAL_COMMUNITY): Payer: Self-pay | Admitting: Pharmacist

## 2024-04-29 LAB — POCT INR: INR: 2.1 (ref 2.0–3.0)

## 2024-05-05 ENCOUNTER — Ambulatory Visit (HOSPITAL_COMMUNITY): Payer: Self-pay | Admitting: Pharmacist

## 2024-05-05 LAB — POCT INR: INR: 2.6 (ref 2.0–3.0)

## 2024-05-12 ENCOUNTER — Ambulatory Visit (HOSPITAL_COMMUNITY): Payer: Self-pay | Admitting: Pharmacist

## 2024-05-12 LAB — POCT INR: INR: 2.2 (ref 2.0–3.0)

## 2024-05-19 ENCOUNTER — Other Ambulatory Visit (HOSPITAL_COMMUNITY): Payer: Self-pay | Admitting: *Deleted

## 2024-05-19 DIAGNOSIS — Z5181 Encounter for therapeutic drug level monitoring: Secondary | ICD-10-CM

## 2024-05-19 DIAGNOSIS — I5022 Chronic systolic (congestive) heart failure: Secondary | ICD-10-CM

## 2024-05-19 DIAGNOSIS — Z95811 Presence of heart assist device: Secondary | ICD-10-CM

## 2024-05-19 DIAGNOSIS — Z7901 Long term (current) use of anticoagulants: Secondary | ICD-10-CM

## 2024-05-21 ENCOUNTER — Ambulatory Visit (HOSPITAL_COMMUNITY): Payer: Self-pay | Admitting: Pharmacist

## 2024-05-21 ENCOUNTER — Encounter (HOSPITAL_COMMUNITY): Payer: Self-pay | Admitting: Internal Medicine

## 2024-05-21 ENCOUNTER — Ambulatory Visit (HOSPITAL_COMMUNITY)
Admission: RE | Admit: 2024-05-21 | Discharge: 2024-05-21 | Disposition: A | Discharge status: Home / Self Care | Source: Ambulatory Visit | Attending: Internal Medicine | Admitting: Internal Medicine

## 2024-05-21 VITALS — BP 113/92 | HR 91 | Wt 150.6 lb

## 2024-05-21 DIAGNOSIS — T827XXA Infection and inflammatory reaction due to other cardiac and vascular devices, implants and grafts, initial encounter: Secondary | ICD-10-CM | POA: Diagnosis not present

## 2024-05-21 DIAGNOSIS — Z95811 Presence of heart assist device: Secondary | ICD-10-CM | POA: Diagnosis not present

## 2024-05-21 DIAGNOSIS — Z4801 Encounter for change or removal of surgical wound dressing: Secondary | ICD-10-CM | POA: Insufficient documentation

## 2024-05-21 DIAGNOSIS — Z5181 Encounter for therapeutic drug level monitoring: Secondary | ICD-10-CM | POA: Diagnosis not present

## 2024-05-21 DIAGNOSIS — I5022 Chronic systolic (congestive) heart failure: Secondary | ICD-10-CM | POA: Diagnosis not present

## 2024-05-21 DIAGNOSIS — Z7901 Long term (current) use of anticoagulants: Secondary | ICD-10-CM | POA: Diagnosis not present

## 2024-05-21 DIAGNOSIS — L299 Pruritus, unspecified: Secondary | ICD-10-CM | POA: Insufficient documentation

## 2024-05-21 DIAGNOSIS — Z720 Tobacco use: Secondary | ICD-10-CM | POA: Diagnosis not present

## 2024-05-21 DIAGNOSIS — R21 Rash and other nonspecific skin eruption: Secondary | ICD-10-CM | POA: Diagnosis not present

## 2024-05-21 DIAGNOSIS — Y831 Surgical operation with implant of artificial internal device as the cause of abnormal reaction of the patient, or of later complication, without mention of misadventure at the time of the procedure: Secondary | ICD-10-CM | POA: Diagnosis not present

## 2024-05-21 LAB — CBC
HCT: 45.8 % (ref 39.0–52.0)
Hemoglobin: 15.1 g/dL (ref 13.0–17.0)
MCH: 27.3 pg (ref 26.0–34.0)
MCHC: 33 g/dL (ref 30.0–36.0)
MCV: 82.8 fL (ref 80.0–100.0)
Platelets: 260 10*3/uL (ref 150–400)
RBC: 5.53 MIL/uL (ref 4.22–5.81)
RDW: 15.3 % (ref 11.5–15.5)
WBC: 5.7 10*3/uL (ref 4.0–10.5)
nRBC: 0 % (ref 0.0–0.2)

## 2024-05-21 LAB — BASIC METABOLIC PANEL WITH GFR
Anion gap: 10 (ref 5–15)
BUN: 16 mg/dL (ref 6–20)
CO2: 28 mmol/L (ref 22–32)
Calcium: 9 mg/dL (ref 8.9–10.3)
Chloride: 99 mmol/L (ref 98–111)
Creatinine, Ser: 0.86 mg/dL (ref 0.61–1.24)
GFR, Estimated: >60 mL/min (ref >60)
Glucose, Bld: 91 mg/dL (ref 70–99)
Potassium: 4 mmol/L (ref 3.5–5.1)
Sodium: 137 mmol/L (ref 135–145)

## 2024-05-21 LAB — DIGOXIN LEVEL: Digoxin Level: <0.4 ng/mL — ABNORMAL LOW (ref 0.8–2.0)

## 2024-05-21 LAB — PROTIME-INR
INR: 1.1 (ref 0.8–1.2)
Prothrombin Time: 14.2 s (ref 11.4–15.2)

## 2024-05-21 LAB — LACTATE DEHYDROGENASE: LDH: 170 U/L (ref 98–192)

## 2024-05-21 NOTE — Patient Instructions (Signed)
 No medication changes 2. Coumadin  dosing per Barbra Ley PharmD 3. Return to clinic in 2 months

## 2024-05-21 NOTE — Progress Notes (Signed)
 Patient presents for his 2 month follow up in VAD Clinic today with Joel York. Reports no problems with VAD equipment or concerns with drive line.  Pt tells me that he has been doing well. Denies lightheadedness, dizziness, falls, shortness of breath, and signs of bleeding. He endorses taking all medications as prescribed.    Pts wife Leni Quiet continues to do his driveline dressing change every couple of days due to pt sweating/itching. Reports minimal drainage. States he got his drive line caught on a door knob roughly a week ago. See dressing change details below.   Pt is currently on chronic suppressive Minocycline  and Levaquin . Pt confirms that he is taking these meds. He continues to follow with ID. Using Kenalog  cream PRN. Referral sent to dermatology last visit. Dermatology office called x 3 and pt did not answer- referral closed. New referral placed today. Pt advised to contact dermatology office for appt.   Pt has not started Chantix  and continues to smoke.     Vital Signs:  Doppler Pressure: 108   Automatc BP: 113/92 (99) HR: 91 SPO2: 98% RA   Weight: 150.6 lb w/ eqt Last weight: 156 lb w/ eqt  VAD Indication: Destination Therapy due to recent smoking    VAD interrogation & Equipment Management: Speed: 5600 Flow: 3.9 Power: 4.4 w    PI: 4.9   Alarms: few low voltage Events: 30 - 40 daily  Fixed speed 5600 Low speed limit: 5300   Primary Controller:  Replace back up battery in 18 months. Back up controller:   Replace back up battery in 20 months.-- did not bring   Annual Equipment Maintenance on UBC/PM was performed on 07/30/23.    I reviewed the LVAD parameters from today and compared the results to the patient's prior recorded data. LVAD interrogation was NEGATIVE for significant power changes, NEGATIVE for clinical alarms and STABLE for PI events/speed drops. No programming changes were made and pump is functioning within specified parameters. Pt is performing daily  controller and system monitor self tests along with completing weekly and monthly maintenance for LVAD equipment.   LVAD equipment check completed and is in good working order. Back-up equipment present. Charged back up battery and performed self-test on equipment.    Exit Site Care: Drive line is being maintained daily by pt's wife Musician. Existing VAD dressing removed and site care performed using sterile technique. Exit site cleansed with Vashe x 2, allowed to dry, and Sorbaview dressing with Vashe moistened 2 x 2 covering driveline. Exit site healed and partially incorporated due to recent trauma, the velour is significantly at exit site. Scant brown drainage with slight foul odor. Slight redness, no tenderness noted. Small rash under previous anchor site. Anchor repositioned and applied over small tegaderm to prevent skin irritation. Pt denies fever or chills.  Continue weekly (or as needed) dressings.  Provided 12 weekly dressing kits, small tegaderms, a box of 2 x 2s, and 25 anchors for home use.       Significant Events on VAD Support:    Device: n/a   BP & Labs:  MAP 108 - Doppler is reflecting MAP   Hgb 15.1 - No S/S of bleeding. Specifically denies melena/BRBPR or nosebleeds.   LDH stable at pending with established baseline of 200-348. Denies tea-colored urine. No power elevations noted on interrogation.   Plan: No medication changes 2. Coumadin  dosing per Barbra Ley PharmD 3. Return to clinic in 2 months   Paulo Bosworth RN VAD Coordinator  Office: [phone]  24/7 Pager: [phone]

## 2024-05-27 ENCOUNTER — Ambulatory Visit (HOSPITAL_COMMUNITY): Payer: Self-pay | Admitting: Pharmacist

## 2024-05-27 LAB — POCT INR: INR: 2.1 (ref 2.0–3.0)

## 2024-05-27 NOTE — Progress Notes (Addendum)
 LVAD CLINIC NOTE  PCP: Jolanda Nation, NP HF Cardiologist: DB  HPI:  Joel York is a 38 y.o. male with HTN, ADD, asthma, hx drug abuse (now on suboxone  x5 yrs), and seizures. Admitted in 7/27 with acute systolic heart failure -> shock. EF 10%. 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   - LHC with normal coronaries. RHC 07/24: Low CO (Fick CO/CI 3.3/1.9) and PAPi 3.0 on milrinone  0.375 . Off NE - cMRI:  LV markedly dilated EF 10% RV 17% NICM  Admitted 09/29/23 with DL infection. Wound cx + staph aureus. Underwent I&D/wound vac change x3. ID saw, discharged home on 6 weeks of IV cefazolin  (end date 11/18/23).   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 Underwent repeat I&D. Wound culture which showed stenotrophomas, staph aureus, and entercoccus cloacea.   Presents to VAD Clinic for f/u. Overall doing well. Active without SOB. Main complaint is joint/arthritis pain. Got his DL caught on a door know the other day and is sore. Remains on suppressive. Levaquin  and minocycline . Denies orthopnea or PND. No fevers or chills. No bleeding, melena or neuro symptoms. No VAD alarms. Taking all meds as prescribed.   VAD Indication: Destination Therapy due to recent smoking    VAD interrogation & Equipment Management: Speed: 5600 Flow: 3.9 Power: 4.4 w    PI: 4.9   Alarms: few low voltage Events: 30 - 40 daily  Fixed speed 5600 Low speed limit: 5300   Primary Controller:  Replace back up battery in 18 months. Back up controller:   Replace back up battery in 20 months.-- did not bring   Annual Equipment Maintenance on UBC/PM was performed on 07/30/23.     Past Medical History:  Diagnosis Date   Acid reflux    ADHD (attention deficit hyperactivity disorder)    Allergy    Asthma    Back pain    LVAD (left ventricular assist device) present (HCC)    Seizures (HCC)    Resolved    Current Outpatient Medications  Medication Sig Dispense Refill   cetirizine  (ZYRTEC ) 10 MG tablet Take  1 tablet (10 mg total) by mouth at bedtime. 30 tablet 6   digoxin  (LANOXIN ) 0.125 MG tablet Take 1 tablet (0.125 mg total) by mouth daily. 30 tablet 6   gabapentin  (NEURONTIN ) 300 MG capsule Take 1 capsule (300 mg total) by mouth 3 (three) times daily. (Patient taking differently: Take 300 mg by mouth 2 (two) times daily.) 90 capsule 6   ivabradine  (CORLANOR ) 5 MG TABS tablet Take 1 tablet (5 mg total) by mouth 2 (two) times daily with a meal. 60 tablet 11   levofloxacin  (LEVAQUIN ) 250 MG tablet Take 2 tablets (500 mg total) by mouth daily. 60 tablet 5   losartan  (COZAAR ) 25 MG tablet Take 1 tablet (25 mg total) by mouth 2 (two) times daily. 60 tablet 6   minocycline  (MINOCIN ) 100 MG capsule Take 2 capsules (200 mg total) by mouth 2 (two) times daily. 120 capsule 4   pantoprazole  (PROTONIX ) 40 MG tablet Take 1 tablet (40 mg total) by mouth daily. 30 tablet 6   sertraline  (ZOLOFT ) 25 MG tablet Take 1 tablet (25 mg total) by mouth daily. 30 tablet 6   spironolactone  (ALDACTONE ) 25 MG tablet Take 1 tablet (25 mg total) by mouth daily. 30 tablet 5   SUBOXONE  8-2 MG FILM Place 1 Film under the tongue daily.     triamcinolone  cream (KENALOG ) 0.1 % Apply 1 Application topically 2 (two)  times daily. 30 g 3   warfarin (COUMADIN ) 5 MG tablet Take 5 mg every Tuesday and 7.5 mg all other days or as directed by HF clinic. 50 tablet 11   Zinc  Sulfate 220 (50 Zn) MG TABS Take 1 tablet (220 mg total) by mouth daily. 30 tablet 5   albuterol  (VENTOLIN  HFA) 108 (90 Base) MCG/ACT inhaler Inhale 2 puffs into the lungs every 6 (six) hours as needed for wheezing or shortness of breath. Shortness of breath (Patient not taking: Reported on 03/25/2024) 1 each 6   hydrOXYzine  (ATARAX ) 25 MG tablet Take 1 tablet (25 mg total) by mouth 3 (three) times daily as needed for itching. (Patient not taking: Reported on 05/21/2024) 30 tablet 0   traZODone  (DESYREL ) 50 MG tablet Take 1 tablet (50 mg total) by mouth at bedtime as needed for  sleep. (Patient not taking: Reported on 03/25/2024) 30 tablet 6   Varenicline  Tartrate, Starter, (CHANTIX  STARTING MONTH PAK) 0.5 MG X 11 & 1 MG X 42 TBPK Days 1 to 3: 0.5 mg once daily. Days 4 to 7: 0.5 mg twice daily. Maintenance (day 8 and later): 1 mg twice daily (Patient not taking: Reported on 05/21/2024) 90 each 3   No current facility-administered medications for this encounter.   Vitals:   05/21/24 1336 05/21/24 1344  BP: (!) 108/0 (!) 113/92  Pulse: 91   SpO2: 98%   Weight: 68.3 kg (150 lb 9.6 oz)    Vital Signs:  Doppler Pressure: 108               Automatc BP: 113/92 (99) HR: 91 SPO2: 98% RA   Weight: 150.6 lb w/ eqt Last weight: 156 lb w/ eqt    Physical Exam: General:  NAD.  HEENT: normal  Neck: supple. JVP not elevated.  Carotids 2+ bilat; no bruits. No lymphadenopathy or thryomegaly appreciated. Cor: LVAD hum.  Lungs: Clear. Abdomen: soft, nontender, non-distended. No hepatosplenomegaly. No bruits or masses. Good bowel sounds. Driveline site with mild erythema (no drainage) Anchor in place.  Extremities: no cyanosis, clubbing, rash. Warm no edema  Neuro: alert & oriented x 3. No focal deficits. 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 Underwent repeat I&D. Wound culture which showed stenotrophomas, staph aureus, and entercoccus cloacea.  - On levaquin  + minocycline  for suppression.  - ID following  - Had DL trauma last week. Site with mild erythema but no drainage. Continue to follow   2. Chronic biventricular systolic heart failure - Admitted 7/24 NYHA IV symptoms .Echo EF <20%,  RV mildly reduced, - Etiology uncertain. ? 2/2 PVCs vs genetically mediated +/- hypertension.  - LHC with normal coronaries.  - cMRI:  LV markedly dilated EF 10% RV 17% NICM - Underwent HM-III VAD on 07/28/25 - Doing well with VAD support NYHA I - Volume ok  - Continue digoxin  0.125 mg  daily for RV support - Continue losartan  25 mg BID - Continue ivabradine  5 bid - HR improved - Not candidate for transplant referral given ongoing tobacco use  3. S/P HMIII LVAD  on 07/29/23 - VAD interrogated personally. Parameters stable.. - MAPs ok  - LDH 170 - Hgb 15.1 - INR 1.1 Goal 2.0-3.0  Discussed warfarin dosing with PharmD personally. Will need lovenox  - Off ASA - DL management as above   4. PVCs - Now off mexilitene - watch for recurrence   5.  Tobacco use - still smoking a few cigarettes per day for stress relief - discussed cessation. Has prescription for Chantix    6. Hx drug abuse - Continue suboxone  - no change  I spent a total of 44 minutes today: 1) reviewing the patient's medical records including previous charts, labs and recent notes from other providers; 2) examining the patient and counseling them on their medical issues/explaining the plan of care; 3) adjusting meds as needed and 4) ordering lab work or other needed tests.    Jules Oar, MD  1:03 PM

## 2024-05-31 ENCOUNTER — Ambulatory Visit: Admitting: Physician Assistant

## 2024-06-03 ENCOUNTER — Ambulatory Visit (HOSPITAL_COMMUNITY): Payer: Self-pay | Admitting: Pharmacist

## 2024-06-03 LAB — POCT INR: INR: 2 (ref 2.0–3.0)

## 2024-06-10 ENCOUNTER — Telehealth: Payer: Self-pay

## 2024-06-10 NOTE — Telephone Encounter (Signed)
 Received fax from [facility] notifying provider of DDI with levofloxacin  and warfarin.   Will route to provider.   [place] Lapaglia, BSN, RN

## 2024-06-11 ENCOUNTER — Telehealth (HOSPITAL_COMMUNITY): Payer: Self-pay | Admitting: Pharmacist

## 2024-06-11 NOTE — Telephone Encounter (Signed)
 Called patient as a reminder to check home INR, however, there was no response.

## 2024-06-14 ENCOUNTER — Other Ambulatory Visit (HOSPITAL_COMMUNITY): Payer: Self-pay

## 2024-06-14 ENCOUNTER — Other Ambulatory Visit: Payer: Self-pay

## 2024-06-14 ENCOUNTER — Ambulatory Visit (HOSPITAL_COMMUNITY): Payer: Self-pay | Admitting: Pharmacist

## 2024-06-14 LAB — POCT INR: INR: 2.9 (ref 2.0–3.0)

## 2024-06-16 ENCOUNTER — Other Ambulatory Visit: Payer: Self-pay

## 2024-06-18 ENCOUNTER — Other Ambulatory Visit: Payer: Self-pay

## 2024-06-18 ENCOUNTER — Other Ambulatory Visit (HOSPITAL_BASED_OUTPATIENT_CLINIC_OR_DEPARTMENT_OTHER): Payer: Self-pay

## 2024-06-28 ENCOUNTER — Ambulatory Visit (HOSPITAL_COMMUNITY): Payer: Self-pay | Admitting: Pharmacist

## 2024-06-28 LAB — POCT INR: INR: 2.1 (ref 2.0–3.0)

## 2024-06-29 ENCOUNTER — Encounter: Payer: Self-pay | Admitting: Physician Assistant

## 2024-06-29 ENCOUNTER — Ambulatory Visit: Admitting: Physician Assistant

## 2024-06-29 VITALS — BP 100/74 | HR 76

## 2024-06-29 DIAGNOSIS — Q800 Ichthyosis vulgaris: Secondary | ICD-10-CM

## 2024-06-29 DIAGNOSIS — L810 Postinflammatory hyperpigmentation: Secondary | ICD-10-CM

## 2024-06-29 DIAGNOSIS — L590 Erythema ab igne [dermatitis ab igne]: Secondary | ICD-10-CM

## 2024-06-29 DIAGNOSIS — Q809 Congenital ichthyosis, unspecified: Secondary | ICD-10-CM

## 2024-06-29 MED ORDER — FLUOCINOLONE ACETONIDE BODY 0.01 % EX OIL
TOPICAL_OIL | CUTANEOUS | 3 refills | Status: AC
Start: 2024-06-29 — End: ?

## 2024-06-29 MED ORDER — FLUOCINOLONE ACETONIDE BODY 0.01 % EX OIL: 1.0000 [IU] | TOPICAL_OIL | Freq: Two times a day (BID) | CUTANEOUS | 5 refills | Status: DC

## 2024-06-29 NOTE — Progress Notes (Signed)
   New Patient Visit   Subjective  Joel York is a 38 y.o. male who presents for the following: rash  Pt has a rash on his right upper back around 8 months since he was released from hospital for heart failure. He was given triamcinolone  which has helped. Has used heating pad in this area.   Also has very very dry skin - worse in the winter time.    The following portions of the chart were reviewed this encounter and updated as appropriate: medications, allergies, medical history  Review of Systems:  No other skin or systemic complaints except as noted in HPI or Assessment and Plan.  Objective  Well appearing patient in no apparent distress; mood and affect are within normal limits.  A focused examination was performed of the following areas: Face, neck, arms, and legs.   Relevant exam findings are noted in the Assessment and Plan.    Assessment & Plan   Erythema abigne vs: post-inflammatory hyperpigmentation - Right upper back  - Reassurance provided - Caution when using heating pad - Discussed this may fade over time.    Ichthyosis Vulgaris  - Rec Amlactin and/or Urea  cream (40-50%)  - Start Dermasmooth body oil (RX) as directed (trade size sample provided).         Return in about 6 months (around 12/30/2024) for skin discoloration.  I, Darice Smock, CMA, am acting as scribe for [employer], PA-C.   Documentation: I have reviewed the above documentation for accuracy and completeness, and I agree with the above.  Havana Baldwin K, PA-C

## 2024-06-29 NOTE — Patient Instructions (Signed)

## 2024-07-08 ENCOUNTER — Ambulatory Visit (HOSPITAL_COMMUNITY): Payer: Self-pay | Admitting: Pharmacist

## 2024-07-08 LAB — POCT INR: INR: 1.8 — AB (ref 2.0–3.0)

## 2024-07-16 ENCOUNTER — Other Ambulatory Visit: Payer: Self-pay

## 2024-07-22 ENCOUNTER — Other Ambulatory Visit (HOSPITAL_COMMUNITY): Payer: Self-pay | Admitting: *Deleted

## 2024-07-22 ENCOUNTER — Ambulatory Visit (HOSPITAL_COMMUNITY): Payer: Self-pay | Admitting: Pharmacist

## 2024-07-22 DIAGNOSIS — Z7901 Long term (current) use of anticoagulants: Secondary | ICD-10-CM

## 2024-07-22 DIAGNOSIS — Z5181 Encounter for therapeutic drug level monitoring: Secondary | ICD-10-CM

## 2024-07-22 DIAGNOSIS — Z95811 Presence of heart assist device: Secondary | ICD-10-CM

## 2024-07-22 DIAGNOSIS — I5022 Chronic systolic (congestive) heart failure: Secondary | ICD-10-CM

## 2024-07-22 LAB — POCT INR: INR: 2.1 (ref 2.0–3.0)

## 2024-07-23 ENCOUNTER — Encounter (HOSPITAL_COMMUNITY): Admitting: Internal Medicine

## 2024-07-26 ENCOUNTER — Other Ambulatory Visit: Payer: Self-pay

## 2024-07-27 ENCOUNTER — Other Ambulatory Visit: Payer: Self-pay

## 2024-07-28 ENCOUNTER — Other Ambulatory Visit (HOSPITAL_COMMUNITY): Payer: Self-pay | Admitting: *Deleted

## 2024-07-28 ENCOUNTER — Ambulatory Visit (HOSPITAL_COMMUNITY)
Admission: RE | Admit: 2024-07-28 | Discharge: 2024-07-28 | Disposition: A | Discharge status: Home / Self Care | Source: Ambulatory Visit | Attending: Cardiology | Admitting: Cardiology

## 2024-07-28 VITALS — BP 114/100 | HR 130 | Wt 154.0 lb

## 2024-07-28 DIAGNOSIS — I110 Hypertensive heart disease with heart failure: Secondary | ICD-10-CM | POA: Insufficient documentation

## 2024-07-28 DIAGNOSIS — Z79899 Other long term (current) drug therapy: Secondary | ICD-10-CM | POA: Diagnosis not present

## 2024-07-28 DIAGNOSIS — I5022 Chronic systolic (congestive) heart failure: Secondary | ICD-10-CM

## 2024-07-28 DIAGNOSIS — Z95811 Presence of heart assist device: Secondary | ICD-10-CM | POA: Diagnosis not present

## 2024-07-28 DIAGNOSIS — I428 Other cardiomyopathies: Secondary | ICD-10-CM | POA: Insufficient documentation

## 2024-07-28 DIAGNOSIS — Z7901 Long term (current) use of anticoagulants: Secondary | ICD-10-CM

## 2024-07-28 DIAGNOSIS — I5082 Biventricular heart failure: Secondary | ICD-10-CM | POA: Insufficient documentation

## 2024-07-28 DIAGNOSIS — Z5181 Encounter for therapeutic drug level monitoring: Secondary | ICD-10-CM

## 2024-07-28 DIAGNOSIS — M109 Gout, unspecified: Secondary | ICD-10-CM | POA: Diagnosis not present

## 2024-07-28 DIAGNOSIS — F17210 Nicotine dependence, cigarettes, uncomplicated: Secondary | ICD-10-CM | POA: Diagnosis not present

## 2024-07-28 DIAGNOSIS — I493 Ventricular premature depolarization: Secondary | ICD-10-CM | POA: Diagnosis not present

## 2024-07-28 DIAGNOSIS — T827XXA Infection and inflammatory reaction due to other cardiac and vascular devices, implants and grafts, initial encounter: Secondary | ICD-10-CM | POA: Diagnosis present

## 2024-07-28 LAB — COMPREHENSIVE METABOLIC PANEL WITH GFR
ALT: 11 U/L (ref 0–44)
AST: 22 U/L (ref 15–41)
Albumin: 3.9 g/dL (ref 3.5–5.0)
Alkaline Phosphatase: 135 U/L — ABNORMAL HIGH (ref 38–126)
Anion gap: 10 (ref 5–15)
BUN: 18 mg/dL (ref 6–20)
CO2: 27 mmol/L (ref 22–32)
Calcium: 9.3 mg/dL (ref 8.9–10.3)
Chloride: 102 mmol/L (ref 98–111)
Creatinine, Ser: 0.99 mg/dL (ref 0.61–1.24)
GFR, Estimated: >60 mL/min (ref >60)
Glucose, Bld: 86 mg/dL (ref 70–99)
Potassium: 3.4 mmol/L — ABNORMAL LOW (ref 3.5–5.1)
Sodium: 139 mmol/L (ref 135–145)
Total Bilirubin: 0.7 mg/dL (ref 0.0–1.2)
Total Protein: 7 g/dL (ref 6.5–8.1)

## 2024-07-28 LAB — LACTATE DEHYDROGENASE: LDH: 193 U/L — ABNORMAL HIGH (ref 98–192)

## 2024-07-28 LAB — PREALBUMIN: Prealbumin: 22 mg/dL (ref 18–38)

## 2024-07-28 LAB — IRON AND TIBC
Iron: 72 ug/dL (ref 45–182)
Saturation Ratios: 20 % (ref 17.9–39.5)
TIBC: 367 ug/dL (ref 250–450)
UIBC: 295 ug/dL

## 2024-07-28 LAB — URIC ACID: Uric Acid, Serum: 5.4 mg/dL (ref 3.7–8.6)

## 2024-07-28 LAB — FOLATE: Folate: 28.1 ng/mL (ref >5.9)

## 2024-07-28 LAB — FERRITIN: Ferritin: 15 ng/mL — ABNORMAL LOW (ref 24–336)

## 2024-07-28 LAB — DIGOXIN LEVEL: Digoxin Level: <0.4 ng/mL — ABNORMAL LOW (ref 0.8–2.0)

## 2024-07-28 MED ORDER — COLCHICINE 0.6 MG PO TABS
0.6000 mg | ORAL_TABLET | Freq: Every day | ORAL | 6 refills | Status: AC
Start: 2024-07-28 — End: ?

## 2024-07-28 NOTE — Progress Notes (Signed)
 Patient presents for his 2 month follow up with 1 year Intermacs and annual maintenance in VAD Clinic today alone. Reports no problems with VAD equipment or concerns with drive line.  Pt tells me that he has been doing well. Denies lightheadedness, dizziness, falls, shortness of breath, and signs of bleeding.  VAD interrogation reveals multiple low voltage advisory and low voltage hazard alarms. Pt admits he is sleeping on his batteries. Advised to sleep on MPU to avoid total battery depletion/accidental pump stop while sleeping. He verbalized understanding.   BP and HR elevated today. Has not taken medications yet today. Reports he has been out of Digoxin , Losartan , Spironolactone , and Protonix  for several days. States he has not missed doses of Corlanor . Plans to pick meds up from pharmacy on the way home. Will see back in clinic in 1 month to assess if BP/HR stabilized with taking medications as prescribed.   Complaining of gout pain in his knees, ankles, heels, and toes for the last few weeks. Discussed with Dr Zenaida- order received to start Colchicine  0.6 mg daily. Uric acid drawn today. If gout pain resolves with Colchicine , will consider transitioning to Allopurinol next visit per Dr Zenaida.   Pts wife Salbador continues to do his driveline dressing change every couple of days due to pt sweating/itching. Reports minimal drainage. States he got his drive line caught on a door knob roughly a week ago at [employer]. Site looks good underneath Sorbaview dressing.   Pt is currently on chronic suppressive Minocycline  and Levaquin . Pt confirms that he is taking these meds. He continues to follow with ID. Using Kenalog  cream PRN. Referral sent to dermatology last visit. Dermatology prescribed PRN Fluocinolone  body oil. Pt states this and Kenalog  cream are helpful.   Started Chantix  last weekend. Hopes this will be helpful to quit smoking.    Vital Signs:  Doppler Pressure: 110   Automatc BP: 114/100  (107) HR: 123 - 130 ST SPO2: 98% RA   Weight: 154 lb w/ eqt Last weight: 150.6 lb w/ eqt  VAD Indication: Destination Therapy due to recent smoking    VAD interrogation & Equipment Management: Speed: 5600 Flow: 3.9 Power: 4.4 w    PI: 4.9   Alarms: multiple low voltage advisory/hazard alarms Events: 40 - 50 daily  Fixed speed 5600 Low speed limit: 5300   Primary Controller:  Replace back up battery in 15 months. Back up controller:   Replace back up battery in 20 months.   Annual Equipment Maintenance on UBC/PM was performed on 07/28/24.    I reviewed the LVAD parameters from today and compared the results to the patient's prior recorded data. LVAD interrogation was NEGATIVE for significant power changes, NEGATIVE for clinical alarms and STABLE for PI events/speed drops. No programming changes were made and pump is functioning within specified parameters. Pt is performing daily controller and system monitor self tests along with completing weekly and monthly maintenance for LVAD equipment.   LVAD equipment check completed and is in good working order. Back-up equipment present. Charged back up battery and performed self-test on equipment.    Exit Site Care: Drive line is being maintained a few times a week by pt's wife Musician. Pt denies fever or chills.  Continue weekly (or as needed) dressings.  Provided 12 weekly dressing kits, box of small tegaderms, 4 boxes large tegaderms,  2 boxes of 2 x 2s, several pairs of small sterile gloves, and 50 anchors for home use.  Significant Events on VAD  Support:    Device: n/a   BP & Labs:  MAP 110 - Doppler is reflecting modified systolic   Hgb pending - No S/S of bleeding. Specifically denies melena/BRBPR or nosebleeds.   LDH stable at pending with established baseline of 200-348. Denies tea-colored urine. No power elevations noted on interrogation.    Batteries Manufacture Date: Number of uses: Re-calibration  05/28/23 126 - 128  Performed by patient   Annual maintenance completed per Biomed on patient's home MPU and universal Magazine features editor.    Backup system controller 11 volt battery charged during visit.   1 year Intermacs follow up completed including:  Quality of Life, KCCQ-12, and Neurocognitive trail making.   Pt completed 1400 feet during 6 minute walk.   Patient Goals: To quit smoking (started Chantix  last weekend)  [place] Cardiomyopathy Questionnaire     07/28/2024    1:09 PM 02/11/2024    1:10 PM 11/18/2023    2:32 PM  KCCQ-12  1 a. Ability to shower/bathe Extremely limited Other, Did not do Quite a bit limited  1 b. Ability to walk 1 block Moderately limited Not at all limited Not at all limited  1 c. Ability to hurry/jog Quite a bit limited Moderately limited Moderately limited  2. Edema feet/ankles/legs Less than once a week Never over the past 2 weeks Never over the past 2 weeks  3. Limited by fatigue Several times a day 1-2 times a week 3+ times per week, not every day  4. Limited by dyspnea 1-2 times a week Less than once a week At least once a day  5. Sitting up / on 3+ pillows Never over the past 2 weeks Never over the past 2 weeks Never over the past 2 weeks  6. Limited enjoyment of life Limited quite a bit Not limited at all Moderately limited  7. Rest of life w/ symptoms Not at all satisfied Somewhat satisfied Not at all satisfied  8 a. Participation in hobbies Severely limited Limited quite a bit Moderately limited  8 b. Participation in chores Severely limited Limited quite a bit Moderately limited  8 c. Visiting family/friends Limited quite a bit Slightly limited Moderately limited     Plan: Start Colchicine  0.6 mg daily for gout pain Coumadin  dosing per Tinnie PharmD Please pick up your Spiro, Losartan , Protonix , & Digoxin  from the pharmacy today Return to clinic in 1 month  Isaiah Knoll RN VAD Coordinator  Office: [phone]  24/7 Pager: [phone]

## 2024-07-28 NOTE — Patient Instructions (Signed)
 Start Colchicine  0.6 mg daily for gout pain Coumadin  dosing per Tinnie PharmD Please pick up your Spiro, Losartan , Protonix , & Digoxin  from the pharmacy today Return to clinic in 1 month

## 2024-07-28 NOTE — Progress Notes (Signed)
 LVAD CLINIC NOTE  PCP: Vincente Shivers, NP HF Cardiologist: DB  HPI:  Joel York is a 38 y.o. male with HTN, ADD, asthma, hx drug abuse (now on suboxone  x5 yrs), and seizures. Admitted in 7/27 with acute systolic heart failure -> shock. EF 10%. 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   - LHC with normal coronaries. RHC 07/24: Low CO (Fick CO/CI 3.3/1.9) and PAPi 3.0 on milrinone  0.375 . Off NE - cMRI:  LV markedly dilated EF 10% RV 17% NICM  Admitted 09/29/23 with DL infection. Wound cx + staph aureus. Underwent I&D/wound vac change x3. ID saw, discharged home on 6 weeks of IV cefazolin  (end date 11/18/23).   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 Underwent repeat I&D. Wound culture which showed stenotrophomas, staph aureus, and entercoccus cloacea.   Presents to VAD clinic for follow up.  Denies any issues today including lightheadedness, dizziness, falls, shortness of breath, bleeding, melena.  Does have multiple low voltage alarms, reports to sleeping on his batteries.  He had not yet taken his medications today and has been out of several of them for a few days.  Reports missing his digoxin , losartan , spironolactone , and Protonix .    Also reports some gout pain in his knees ankles and toes over the past few weeks, will be started on 0.6 mg daily of colchicine , uric acid to be ordered.  No driveline issues, minimal drainage.  Did get his driveline caught in a doorknob recently.  Site still looks okay.  VAD Indication: Destination Therapy due to recent smoking    VAD interrogation & Equipment Management: Speed: 5600 Flow: 3.9 Power: 4.4 w    PI: 4.9   Alarms: multiple low voltage advisory/hazard alarms Events: 40 - 50 daily  Fixed speed 5600 Low speed limit: 5300   Primary Controller:  Replace back up battery in 15 months. Back up controller:   Replace back up battery in 20 months   Annual Equipment Maintenance on UBC/PM was performed on 07/28/2024    Past Medical  History:  Diagnosis Date   Acid reflux    ADHD (attention deficit hyperactivity disorder)    Allergy    Asthma    Back pain    LVAD (left ventricular assist device) present (HCC)    Seizures (HCC)    Resolved    Current Outpatient Medications  Medication Sig Dispense Refill   albuterol  (VENTOLIN  HFA) 108 (90 Base) MCG/ACT inhaler Inhale 2 puffs into the lungs every 6 (six) hours as needed for wheezing or shortness of breath. Shortness of breath (Patient not taking: Reported on 03/25/2024) 1 each 6   cetirizine  (ZYRTEC ) 10 MG tablet Take 1 tablet (10 mg total) by mouth at bedtime. 30 tablet 6   digoxin  (LANOXIN ) 0.125 MG tablet Take 1 tablet (0.125 mg total) by mouth daily. 30 tablet 6   Fluocinolone  Acetonide Body 0.01 % OIL Apply to damp skin (affected areas) 2-3 x weekly, as needed. 120 mL 3   gabapentin  (NEURONTIN ) 300 MG capsule Take 1 capsule (300 mg total) by mouth 3 (three) times daily. (Patient taking differently: Take 300 mg by mouth 2 (two) times daily.) 90 capsule 6   hydrOXYzine  (ATARAX ) 25 MG tablet Take 1 tablet (25 mg total) by mouth 3 (three) times daily as needed for itching. (Patient not taking: Reported on 05/21/2024) 30 tablet 0   ivabradine  (CORLANOR ) 5 MG TABS tablet Take 1 tablet (5 mg total) by mouth 2 (two) times daily with a meal. 60  tablet 11   levofloxacin  (LEVAQUIN ) 250 MG tablet Take 2 tablets (500 mg total) by mouth daily. 60 tablet 5   losartan  (COZAAR ) 25 MG tablet Take 1 tablet (25 mg total) by mouth 2 (two) times daily. 60 tablet 6   minocycline  (MINOCIN ) 100 MG capsule Take 2 capsules (200 mg total) by mouth 2 (two) times daily. 120 capsule 4   pantoprazole  (PROTONIX ) 40 MG tablet Take 1 tablet (40 mg total) by mouth daily. 30 tablet 6   sertraline  (ZOLOFT ) 25 MG tablet Take 1 tablet (25 mg total) by mouth daily. 30 tablet 6   spironolactone  (ALDACTONE ) 25 MG tablet Take 1 tablet (25 mg total) by mouth daily. 30 tablet 5   SUBOXONE  8-2 MG FILM Place 1 Film  under the tongue daily.     traZODone  (DESYREL ) 50 MG tablet Take 1 tablet (50 mg total) by mouth at bedtime as needed for sleep. (Patient not taking: Reported on 03/25/2024) 30 tablet 6   triamcinolone  cream (KENALOG ) 0.1 % Apply 1 Application topically 2 (two) times daily. 30 g 3   Varenicline  Tartrate, Starter, (CHANTIX  STARTING MONTH PAK) 0.5 MG X 11 & 1 MG X 42 TBPK Days 1 to 3: 0.5 mg once daily. Days 4 to 7: 0.5 mg twice daily. Maintenance (day 8 and later): 1 mg twice daily (Patient not taking: Reported on 05/21/2024) 90 each 3   warfarin (COUMADIN ) 5 MG tablet Take 5 mg every Tuesday and 7.5 mg all other days or as directed by HF clinic. 50 tablet 11   Zinc  Sulfate 220 (50 Zn) MG TABS Take 1 tablet (220 mg total) by mouth daily. 30 tablet 5   No current facility-administered medications for this encounter.   There were no vitals filed for this visit.  Doppler Pressure: 110                         Automatc BP: 114/100 (107) HR: 123 - 130 ST SPO2: 98% RA   Weight: 154 lb w/ eqt Last weight: 150.6 lb w/ eqt    Physical Exam: General:  NAD. Cor: LVAD hum. JVP flat, tachycardic Lungs: Clear. Abdomen: soft, nontender, non-distended. No hepatosplenomegaly. No bruits or masses. Good bowel sounds. Driveline site with mild erythema (no drainage) Anchor in place.  Extremities: no cyanosis, clubbing, rash. Warm no edema  Neuro: alert & oriented x 3. No focal deficits. 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 Underwent repeat I&D. Wound culture which showed stenotrophomas, staph aureus, and entercoccus cloacea.  - On levaquin  + minocycline  for suppression.  - ID following  - Mild trauma last week, but no increase in drainage, site looks ok   2. Chronic biventricular systolic heart failure - Admitted 7/24 NYHA IV symptoms .Echo EF <20%,  RV mildly reduced, - Etiology uncertain. ? 2/2 PVCs vs  genetically mediated +/- hypertension.  - LHC with normal coronaries.  - cMRI:  LV markedly dilated EF 10% RV 17% NICM - Underwent HM-III VAD on 07/28/25 - Doing well with VAD support NYHA I - BP and HR elevated more than baseline today, been out of a few medications as noted above - Still euvolemic - Restart digoxin  0.125mg  daily, losartan  25mg  BID, spironolactone  25mg  daily - Continue ivabradine  5mg  BID - Not candidate for transplant referral given ongoing tobacco use  3. S/P HMIII LVAD  on 07/29/23 -  VAD interrogated personally. Parameters stable.. - MAPs elevated, missing medications  - LDH 169, stable - Hgb 16.7 - INR 2.0 Goal 2.0-3.0  discussed warfarin dosing with pharmD - Off ASA - DL management as above   4. PVCs - Now off mexilitene - watch for recurrence   5. Tobacco use - still smoking a few cigarettes per day for stress relief - discussed cessation. Has prescription for Chantix    6. Hx drug abuse - Continue suboxone  - no change  I spent a total of 42 minutes today: 1) reviewing the patient's medical records including previous charts, labs and recent notes from other providers; 2) examining the patient and counseling them on their medical issues/explaining the plan of care; 3) adjusting meds as needed and 4) ordering lab work or other needed tests.    Morene JINNY Brownie, MD  11:51 AM

## 2024-07-29 ENCOUNTER — Ambulatory Visit (HOSPITAL_COMMUNITY)
Admission: RE | Admit: 2024-07-29 | Discharge: 2024-07-29 | Disposition: A | Discharge status: Home / Self Care | Source: Ambulatory Visit | Attending: Cardiology | Admitting: Cardiology

## 2024-07-29 ENCOUNTER — Ambulatory Visit (HOSPITAL_COMMUNITY): Payer: Self-pay | Admitting: Pharmacist

## 2024-07-29 DIAGNOSIS — I5022 Chronic systolic (congestive) heart failure: Secondary | ICD-10-CM | POA: Insufficient documentation

## 2024-07-29 DIAGNOSIS — Z7901 Long term (current) use of anticoagulants: Secondary | ICD-10-CM | POA: Insufficient documentation

## 2024-07-29 DIAGNOSIS — Z95811 Presence of heart assist device: Secondary | ICD-10-CM | POA: Insufficient documentation

## 2024-07-29 LAB — COMPREHENSIVE METABOLIC PANEL WITH GFR
ALT: 12 U/L (ref 0–44)
AST: 21 U/L (ref 15–41)
Albumin: 3.8 g/dL (ref 3.5–5.0)
Alkaline Phosphatase: 123 U/L (ref 38–126)
Anion gap: 11 (ref 5–15)
BUN: 16 mg/dL (ref 6–20)
CO2: 25 mmol/L (ref 22–32)
Calcium: 9.2 mg/dL (ref 8.9–10.3)
Chloride: 103 mmol/L (ref 98–111)
Creatinine, Ser: 1.02 mg/dL (ref 0.61–1.24)
GFR, Estimated: >60 mL/min (ref >60)
Glucose, Bld: 104 mg/dL — ABNORMAL HIGH (ref 70–99)
Potassium: 3.8 mmol/L (ref 3.5–5.1)
Sodium: 139 mmol/L (ref 135–145)
Total Bilirubin: 0.5 mg/dL (ref 0.0–1.2)
Total Protein: 7 g/dL (ref 6.5–8.1)

## 2024-07-29 LAB — DIGOXIN LEVEL: Digoxin Level: <0.4 ng/mL — ABNORMAL LOW (ref 0.8–2.0)

## 2024-07-29 LAB — VITAMIN B12: Vitamin B-12: 378 pg/mL (ref 180–914)

## 2024-07-29 LAB — FERRITIN: Ferritin: 13 ng/mL — ABNORMAL LOW (ref 24–336)

## 2024-07-29 LAB — LACTATE DEHYDROGENASE: LDH: 169 U/L (ref 98–192)

## 2024-07-29 LAB — CBC
HCT: 49.1 % (ref 39.0–52.0)
Hemoglobin: 16.7 g/dL (ref 13.0–17.0)
MCH: 28.8 pg (ref 26.0–34.0)
MCHC: 34 g/dL (ref 30.0–36.0)
MCV: 84.8 fL (ref 80.0–100.0)
Platelets: 213 K/uL (ref 150–400)
RBC: 5.79 MIL/uL (ref 4.22–5.81)
RDW: 14.8 % (ref 11.5–15.5)
WBC: 5.8 K/uL (ref 4.0–10.5)
nRBC: 0 % (ref 0.0–0.2)

## 2024-07-29 LAB — IRON AND TIBC
Iron: 114 ug/dL (ref 45–182)
Saturation Ratios: 30 % (ref 17.9–39.5)
TIBC: 377 ug/dL (ref 250–450)
UIBC: 263 ug/dL

## 2024-07-29 LAB — FOLATE: Folate: 26.1 ng/mL (ref >5.9)

## 2024-07-29 LAB — PROTIME-INR
INR: 2 — ABNORMAL HIGH (ref 0.8–1.2)
Prothrombin Time: 23.5 s — ABNORMAL HIGH (ref 11.4–15.2)

## 2024-07-29 LAB — PREALBUMIN: Prealbumin: 23 mg/dL (ref 18–38)

## 2024-07-29 LAB — MAGNESIUM: Magnesium: 1.9 mg/dL (ref 1.7–2.4)

## 2024-08-12 ENCOUNTER — Ambulatory Visit (HOSPITAL_COMMUNITY): Payer: Self-pay | Admitting: Pharmacist

## 2024-08-12 LAB — POCT INR: INR: 2.2 (ref 2.0–3.0)

## 2024-08-20 ENCOUNTER — Ambulatory Visit (HOSPITAL_COMMUNITY): Payer: Self-pay | Admitting: Pharmacist

## 2024-08-20 LAB — POCT INR: INR: 2.1 (ref 2.0–3.0)

## 2024-08-25 LAB — POCT INR: INR: 2.2 (ref 2.0–3.0)

## 2024-08-26 ENCOUNTER — Ambulatory Visit (HOSPITAL_COMMUNITY): Payer: Self-pay | Admitting: Pharmacist

## 2024-09-02 ENCOUNTER — Ambulatory Visit (HOSPITAL_COMMUNITY): Payer: Self-pay | Admitting: Pharmacist

## 2024-09-02 LAB — POCT INR: INR: 2.1 (ref 2.0–3.0)

## 2024-09-07 ENCOUNTER — Encounter (HOSPITAL_COMMUNITY): Admitting: Internal Medicine

## 2024-09-09 ENCOUNTER — Ambulatory Visit (HOSPITAL_COMMUNITY): Payer: Self-pay | Admitting: Pharmacist

## 2024-09-09 LAB — POCT INR: INR: 2.2 (ref 2.0–3.0)

## 2024-09-10 ENCOUNTER — Ambulatory Visit (HOSPITAL_COMMUNITY)
Admission: RE | Admit: 2024-09-10 | Discharge: 2024-09-10 | Disposition: A | Discharge status: Home / Self Care | Source: Ambulatory Visit | Attending: Internal Medicine | Admitting: Internal Medicine

## 2024-09-10 DIAGNOSIS — Z4801 Encounter for change or removal of surgical wound dressing: Secondary | ICD-10-CM | POA: Insufficient documentation

## 2024-09-10 DIAGNOSIS — Z4509 Encounter for adjustment and management of other cardiac device: Secondary | ICD-10-CM | POA: Diagnosis not present

## 2024-09-10 DIAGNOSIS — J4530 Mild persistent asthma, uncomplicated: Secondary | ICD-10-CM

## 2024-09-10 DIAGNOSIS — Z95811 Presence of heart assist device: Secondary | ICD-10-CM | POA: Insufficient documentation

## 2024-09-10 LAB — CBC
HCT: 45 % (ref 39.0–52.0)
Hemoglobin: 15.1 g/dL (ref 13.0–17.0)
MCH: 29.2 pg (ref 26.0–34.0)
MCHC: 33.6 g/dL (ref 30.0–36.0)
MCV: 86.9 fL (ref 80.0–100.0)
Platelets: 253 K/uL (ref 150–400)
RBC: 5.18 MIL/uL (ref 4.22–5.81)
RDW: 14.1 % (ref 11.5–15.5)
WBC: 5.5 K/uL (ref 4.0–10.5)
nRBC: 0 % (ref 0.0–0.2)

## 2024-09-10 LAB — BASIC METABOLIC PANEL WITH GFR
Anion gap: 7 (ref 5–15)
BUN: 13 mg/dL (ref 6–20)
CO2: 28 mmol/L (ref 22–32)
Calcium: 8.9 mg/dL (ref 8.9–10.3)
Chloride: 105 mmol/L (ref 98–111)
Creatinine, Ser: 1.01 mg/dL (ref 0.61–1.24)
GFR, Estimated: >60 mL/min (ref >60)
Glucose, Bld: 74 mg/dL (ref 70–99)
Potassium: 3.9 mmol/L (ref 3.5–5.1)
Sodium: 140 mmol/L (ref 135–145)

## 2024-09-10 LAB — PROTIME-INR
INR: 2.6 — ABNORMAL HIGH (ref 0.8–1.2)
Prothrombin Time: 29.2 s — ABNORMAL HIGH (ref 11.4–15.2)

## 2024-09-10 LAB — LACTATE DEHYDROGENASE: LDH: 158 U/L (ref 98–192)

## 2024-09-10 MED ORDER — GABAPENTIN 300 MG PO CAPS: 300.0000 mg | ORAL_CAPSULE | Freq: Three times a day (TID) | ORAL | 6 refills | Status: AC

## 2024-09-10 MED ORDER — ZINC SULFATE 220 (50 ZN) MG PO TABS: 220.0000 mg | ORAL_TABLET | Freq: Every day | ORAL | 11 refills | Status: AC

## 2024-09-10 MED ORDER — PANTOPRAZOLE SODIUM 40 MG PO TBEC: 40.0000 mg | DELAYED_RELEASE_TABLET | Freq: Every day | ORAL | 11 refills | Status: AC

## 2024-09-10 MED ORDER — DIGOXIN 125 MCG PO TABS: 0.1250 mg | ORAL_TABLET | Freq: Every day | ORAL | 11 refills | Status: AC

## 2024-09-10 MED ORDER — ALBUTEROL SULFATE HFA 108 (90 BASE) MCG/ACT IN AERS
2.0000 | INHALATION_SPRAY | Freq: Four times a day (QID) | RESPIRATORY_TRACT | 11 refills | Status: AC | PRN
Start: 2024-09-10 — End: ?

## 2024-09-10 MED ORDER — LOSARTAN POTASSIUM 25 MG PO TABS: 25.0000 mg | ORAL_TABLET | Freq: Two times a day (BID) | ORAL | 11 refills | Status: AC

## 2024-09-10 MED ORDER — SERTRALINE HCL 25 MG PO TABS: 25.0000 mg | ORAL_TABLET | Freq: Every day | ORAL | 11 refills | Status: AC

## 2024-09-10 MED ORDER — SPIRONOLACTONE 25 MG PO TABS: 25.0000 mg | ORAL_TABLET | Freq: Every day | ORAL | 11 refills | Status: AC

## 2024-09-10 NOTE — Progress Notes (Signed)
 Patient presents for his 2 month follow up in VAD Clinic today with his wife Arnaldo. Reports no problems with VAD equipment or concerns with drive line.  Pt tells me that he has been doing well. Denies lightheadedness, dizziness, falls, shortness of breath, and signs of bleeding.  VAD interrogation reveals multiple low voltage advisory and low voltage hazard alarms. Pt admits he is sleeping on his batteries and tells me that he waits to change his batteries until they beep low voltage. Advised to sleep on MPU to avoid total battery depletion/accidental pump stop while sleeping. He verbalized understanding.   Pts wife Salbador continues to do his driveline dressing change every 5 days. Reports minimal drainage. Change in clinic today. See below for full documentation.  Pt is currently on chronic suppressive Minocycline  and Levaquin . Pt confirms that he is taking these meds. He tells me that he was out of his Minocycline  for 4-5 days because pharmacy had to order it. He continues to follow with ID. Using Kenalog  cream PRN.   Pt continues to smoke on and off.  The following medications have been refilled today at the pts request: Gabapentin  Protonix  Sertraline  Losartan  Digoxin  Spiro Zinc   albuterol    Vital Signs:  Doppler Pressure: 120   Automatc BP: 113/81 (91) HR: 107 SPO2: 96% RA   Weight: 155.4 lb w/ eqt Last weight: 154 lb w/ eqt  VAD Indication: Destination Therapy due to recent smoking    VAD interrogation & Equipment Management: Speed: 5600 Flow: 3.7 Power: 4.5 w    PI: 5.7   Alarms: multiple low voltage advisory/hazard alarms Events: 20 - 30 daily  Fixed speed 5600 Low speed limit: 5300   Primary Controller:  Replace back up battery in 14 months. Back up controller:   Replace back up battery in 19 months.   Annual Equipment Maintenance on UBC/PM was performed on 07/28/24.    I reviewed the LVAD parameters from today and compared the results to the patient's  prior recorded data. LVAD interrogation was NEGATIVE for significant power changes, NEGATIVE for clinical alarms and STABLE for PI events/speed drops. No programming changes were made and pump is functioning within specified parameters. Pt is performing daily controller and system monitor self tests along with completing weekly and monthly maintenance for LVAD equipment.   LVAD equipment check completed and is in good working order. Back-up equipment present. Charged back up battery and performed self-test on equipment.    Exit Site Care: Existing VAD dressing removed and site care performed using sterile technique. Drive line exit site cleaned with Vashe x 2, allowed to dry, and Sorbaview dressing with Vashe moistened 2 x 2 applied under dressing. Exit site healed and partially incorporated, the velour is significantly exposed at exit site. Does not tunnel. Scant blood tinged yellow drainage with foul odor. Pt and Arnaldo state that the drainage is always minimal and consistently has an odor. No redness, tenderness, or rash noted. Drive line anchor re-applied. Pt denies fever or chills. Provided 12 weekly dressing kits and 10 anchors for home use.      Significant Events on VAD Support:    Device: n/a   BP & Labs:  MAP 120 - Doppler is reflecting modified systolic   Hgb 15.1- No S/S of bleeding. Specifically denies melena/BRBPR or nosebleeds.   LDH 158 at pending with established baseline of 200-348. Denies tea-colored urine. No power elevations noted on interrogation.    Plan: No change in medications Coumadin  dosing per Tinnie PharmD Return  to clinic in 2 month  Lauraine Ip RN VAD Coordinator  Office: [phone]  24/7 Pager: [phone]

## 2024-09-10 NOTE — Patient Instructions (Signed)
No change in medications Return to clinic in 2 mo 

## 2024-09-17 ENCOUNTER — Ambulatory Visit (HOSPITAL_COMMUNITY): Payer: Self-pay | Admitting: Pharmacist

## 2024-09-17 LAB — POCT INR: INR: 2.4 (ref 2.0–3.0)

## 2024-09-20 ENCOUNTER — Telehealth: Payer: Self-pay | Admitting: *Deleted

## 2024-09-20 DIAGNOSIS — I509 Heart failure, unspecified: Secondary | ICD-10-CM

## 2024-09-20 NOTE — Progress Notes (Unsigned)
 Complex Care Management Note Care Guide Note  09/20/2024 Name: Joel York MRN: [id number] DOB: July 02, 1986   Complex Care Management Outreach Attempts: An unsuccessful telephone outreach was attempted today to offer the patient information about available complex care management services.  Follow Up Plan:  Additional outreach attempts will be made to offer the patient complex care management information and services.   Encounter Outcome:  No Answer  Joel York  [facility]  [facility], [facility] Guide  Direct Dial: [phone]  Fax [phone]

## 2024-09-21 NOTE — Progress Notes (Unsigned)
 Complex Care Management Note Care Guide Note  09/21/2024 Name: Joel York MRN: [id number] DOB: 1986-04-18   MyChart message sent   Complex Care Management Outreach Attempts: A second unsuccessful outreach was attempted today to offer the patient with information about available complex care management services.  Follow Up Plan:  Additional outreach attempts will be made to offer the patient complex care management information and services.   Encounter Outcome:  No Answer  Harlene Satterfield  [facility] Health  [facility], [facility] Guide  Direct Dial: [phone]  Fax [phone]

## 2024-09-22 NOTE — Progress Notes (Signed)
 Complex Care Management Note Care Guide Note  09/22/2024 Name: Joel York MRN: [id number] DOB: 1986/12/15   Complex Care Management Outreach Attempts: A third unsuccessful outreach was attempted today to offer the patient with information about available complex care management services.  Follow Up Plan:  No further outreach attempts will be made at this time. We have been unable to contact the patient to offer or enroll patient in complex care management services.  Encounter Outcome:  No Answer  Harlene Satterfield  [facility]  [facility], [facility] Guide  Direct Dial: [phone]  Fax [phone]

## 2024-10-01 ENCOUNTER — Telehealth (HOSPITAL_COMMUNITY): Payer: Self-pay | Admitting: Pharmacist

## 2024-10-01 NOTE — Telephone Encounter (Signed)
 Called patient as a reminder to check INR that is overdue. Left voicemail.

## 2024-10-04 NOTE — Telephone Encounter (Signed)
 Attempted to call patient as a reminder to check INR with no response. LVM.

## 2024-10-05 ENCOUNTER — Ambulatory Visit (HOSPITAL_COMMUNITY): Payer: Self-pay | Admitting: Pharmacist

## 2024-10-05 LAB — POCT INR: INR: 2.4 (ref 2.0–3.0)

## 2024-10-13 ENCOUNTER — Ambulatory Visit (HOSPITAL_COMMUNITY): Payer: Self-pay | Admitting: Pharmacist

## 2024-10-13 ENCOUNTER — Telehealth (HOSPITAL_COMMUNITY): Payer: Self-pay | Admitting: Pharmacist

## 2024-10-13 LAB — POCT INR: INR: 2.2 (ref 2.0–3.0)

## 2024-10-13 NOTE — Telephone Encounter (Signed)
 Called patient as a reminder to check INR.

## 2024-10-21 LAB — POCT INR: INR: 2.2 (ref 2.0–3.0)

## 2024-10-22 ENCOUNTER — Ambulatory Visit (HOSPITAL_COMMUNITY): Payer: Self-pay | Admitting: Pharmacist

## 2024-10-29 ENCOUNTER — Ambulatory Visit (HOSPITAL_COMMUNITY): Payer: Self-pay | Admitting: Pharmacist

## 2024-10-29 LAB — POCT INR: INR: 2.3 (ref 2.0–3.0)

## 2024-11-02 ENCOUNTER — Other Ambulatory Visit (HOSPITAL_COMMUNITY): Payer: Self-pay

## 2024-11-02 DIAGNOSIS — Z7901 Long term (current) use of anticoagulants: Secondary | ICD-10-CM

## 2024-11-02 DIAGNOSIS — Z95811 Presence of heart assist device: Secondary | ICD-10-CM

## 2024-11-04 ENCOUNTER — Encounter (HOSPITAL_COMMUNITY): Admitting: Internal Medicine

## 2024-11-07 ENCOUNTER — Other Ambulatory Visit: Payer: Self-pay | Admitting: Internal Medicine

## 2024-11-09 ENCOUNTER — Other Ambulatory Visit: Payer: Self-pay | Admitting: Internal Medicine

## 2024-11-09 DIAGNOSIS — T827XXA Infection and inflammatory reaction due to other cardiac and vascular devices, implants and grafts, initial encounter: Secondary | ICD-10-CM

## 2024-11-09 NOTE — Progress Notes (Signed)
 labs

## 2024-11-10 ENCOUNTER — Ambulatory Visit (HOSPITAL_COMMUNITY): Payer: Self-pay | Admitting: Pharmacist

## 2024-11-10 LAB — POCT INR: INR: 2.3 (ref 2.0–3.0)

## 2024-11-11 ENCOUNTER — Ambulatory Visit (HOSPITAL_COMMUNITY)
Admission: RE | Admit: 2024-11-11 | Discharge: 2024-11-11 | Disposition: A | Discharge status: Home / Self Care | Source: Ambulatory Visit | Attending: Internal Medicine | Admitting: Internal Medicine

## 2024-11-11 ENCOUNTER — Ambulatory Visit (HOSPITAL_COMMUNITY): Payer: Self-pay | Admitting: Pharmacist

## 2024-11-11 ENCOUNTER — Other Ambulatory Visit (HOSPITAL_COMMUNITY): Payer: Self-pay | Admitting: *Deleted

## 2024-11-11 ENCOUNTER — Other Ambulatory Visit (HOSPITAL_COMMUNITY): Payer: Self-pay

## 2024-11-11 VITALS — BP 90/60 | HR 128 | Wt 165.0 lb

## 2024-11-11 DIAGNOSIS — F988 Other specified behavioral and emotional disorders with onset usually occurring in childhood and adolescence: Secondary | ICD-10-CM | POA: Insufficient documentation

## 2024-11-11 DIAGNOSIS — Z7901 Long term (current) use of anticoagulants: Secondary | ICD-10-CM

## 2024-11-11 DIAGNOSIS — F17210 Nicotine dependence, cigarettes, uncomplicated: Secondary | ICD-10-CM | POA: Insufficient documentation

## 2024-11-11 DIAGNOSIS — F1120 Opioid dependence, uncomplicated: Secondary | ICD-10-CM | POA: Diagnosis not present

## 2024-11-11 DIAGNOSIS — Z79899 Other long term (current) drug therapy: Secondary | ICD-10-CM | POA: Insufficient documentation

## 2024-11-11 DIAGNOSIS — I110 Hypertensive heart disease with heart failure: Secondary | ICD-10-CM | POA: Insufficient documentation

## 2024-11-11 DIAGNOSIS — Z95811 Presence of heart assist device: Secondary | ICD-10-CM | POA: Diagnosis not present

## 2024-11-11 DIAGNOSIS — I5082 Biventricular heart failure: Secondary | ICD-10-CM | POA: Diagnosis not present

## 2024-11-11 DIAGNOSIS — Z792 Long term (current) use of antibiotics: Secondary | ICD-10-CM | POA: Diagnosis not present

## 2024-11-11 DIAGNOSIS — J45909 Unspecified asthma, uncomplicated: Secondary | ICD-10-CM | POA: Insufficient documentation

## 2024-11-11 DIAGNOSIS — I5022 Chronic systolic (congestive) heart failure: Secondary | ICD-10-CM | POA: Diagnosis not present

## 2024-11-11 DIAGNOSIS — I493 Ventricular premature depolarization: Secondary | ICD-10-CM | POA: Diagnosis not present

## 2024-11-11 DIAGNOSIS — Z4509 Encounter for adjustment and management of other cardiac device: Secondary | ICD-10-CM | POA: Insufficient documentation

## 2024-11-11 DIAGNOSIS — T827XXA Infection and inflammatory reaction due to other cardiac and vascular devices, implants and grafts, initial encounter: Secondary | ICD-10-CM

## 2024-11-11 DIAGNOSIS — Z720 Tobacco use: Secondary | ICD-10-CM

## 2024-11-11 LAB — CBC WITH DIFFERENTIAL/PLATELET
Abs Immature Granulocytes: 0.01 K/uL (ref 0.00–0.07)
Basophils Absolute: 0 K/uL (ref 0.0–0.1)
Basophils Relative: 0 %
Eosinophils Absolute: 0.3 K/uL (ref 0.0–0.5)
Eosinophils Relative: 5 %
HCT: 45.6 % (ref 39.0–52.0)
Hemoglobin: 14.7 g/dL (ref 13.0–17.0)
Immature Granulocytes: 0 %
Lymphocytes Relative: 27 %
Lymphs Abs: 1.6 K/uL (ref 0.7–4.0)
MCH: 26.9 pg (ref 26.0–34.0)
MCHC: 32.2 g/dL (ref 30.0–36.0)
MCV: 83.5 fL (ref 80.0–100.0)
Monocytes Absolute: 0.9 K/uL (ref 0.1–1.0)
Monocytes Relative: 15 %
Neutro Abs: 3 K/uL (ref 1.7–7.7)
Neutrophils Relative %: 53 %
Platelets: 218 K/uL (ref 150–400)
RBC: 5.46 MIL/uL (ref 4.22–5.81)
RDW: 14.6 % (ref 11.5–15.5)
WBC: 5.7 K/uL (ref 4.0–10.5)
nRBC: 0 % (ref 0.0–0.2)

## 2024-11-11 LAB — LACTATE DEHYDROGENASE: LDH: 175 U/L (ref 105–235)

## 2024-11-11 LAB — COMPREHENSIVE METABOLIC PANEL WITH GFR
ALT: 14 U/L (ref 0–44)
AST: 23 U/L (ref 15–41)
Albumin: 3.3 g/dL — ABNORMAL LOW (ref 3.5–5.0)
Alkaline Phosphatase: 97 U/L (ref 38–126)
Anion gap: 11 (ref 5–15)
BUN: 12 mg/dL (ref 6–20)
CO2: 25 mmol/L (ref 22–32)
Calcium: 8.7 mg/dL — ABNORMAL LOW (ref 8.9–10.3)
Chloride: 103 mmol/L (ref 98–111)
Creatinine, Ser: 1.07 mg/dL (ref 0.61–1.24)
GFR, Estimated: >60 mL/min (ref >60)
Glucose, Bld: 101 mg/dL — ABNORMAL HIGH (ref 70–99)
Potassium: 4.3 mmol/L (ref 3.5–5.1)
Sodium: 139 mmol/L (ref 135–145)
Total Bilirubin: 0.9 mg/dL (ref 0.0–1.2)
Total Protein: 6.2 g/dL — ABNORMAL LOW (ref 6.5–8.1)

## 2024-11-11 LAB — PROTIME-INR
INR: 1.7 — ABNORMAL HIGH (ref 0.8–1.2)
Prothrombin Time: 20.7 s — ABNORMAL HIGH (ref 11.4–15.2)

## 2024-11-11 MED ORDER — IVABRADINE HCL 5 MG PO TABS
5.0000 mg | ORAL_TABLET | Freq: Two times a day (BID) | ORAL | 11 refills | Status: AC
  Filled 2024-11-11: qty 60, 30d supply, fill #0

## 2024-11-11 NOTE — Patient Instructions (Signed)
 No medication change Pick up Corlanor  today Return to clinic on Wednesday for Zio patch placement Return to clinic in 2 months for follow up with Dr Bensimhon

## 2024-11-11 NOTE — Progress Notes (Signed)
 Patient presents for his 2 month follow up in VAD Clinic today with his wife Arnaldo. Reports no problems with VAD equipment or concerns with drive line.  Pt tells me that he has been doing well. Denies lightheadedness, dizziness, falls, shortness of breath, and signs of bleeding.  VAD interrogation reveals multiple low voltage advisory and low voltage hazard alarms. Pt admits he is sleeping on his batteries and tells me that he waits to change his batteries until they beep low voltage. Advised to sleep on MPU to avoid total battery depletion/accidental pump stop while sleeping. He verbalized understanding.   Pts wife Salbador continues to do his driveline dressing change every 5 days. Reports minimal drainage. Change in clinic today. He tells me that his son yanked my driveline. See below for full documentation.  Pt is currently on chronic suppressive Minocycline  and Levaquin . Pt confirms that he is taking these meds. He tells me that he was out of his Minocycline  for 4-5 days because pharmacy had to order it. He continues to follow with ID. Using Kenalog  cream PRN.   Pt continues to smoke on and off.  Pts HR is 120-130s today. He tells me that he has been out of his Corlanor  for approx 2 days. Refill provided. Pt instructed to restart Corlanor  and appt made for zio patch to be placed in HF clinic next week.  Pt has not taken any of his medications today.    Vital Signs:  Doppler Pressure: 110   Automatc BP: 90/60 (71) HR: 123 SPO2: 97% RA   Weight: 165 lb w/ eqt Last weight: 155.4 lb w/ eqt  VAD Indication: Destination Therapy due to recent smoking    VAD interrogation & Equipment Management: Speed: 5600 Flow: 3.7 Power: 4.4 w    PI: 4.9   Alarms: multiple low voltage advisory/hazard alarms Events: 10 - 20 daily  Fixed speed 5600 Low speed limit: 5300   Primary Controller:  Replace back up battery in 12 months. Back up controller:   Replace back up battery in 17  months.   Annual Equipment Maintenance on UBC/PM was performed on 07/28/24.    I reviewed the LVAD parameters from today and compared the results to the patient's prior recorded data. LVAD interrogation was NEGATIVE for significant power changes, NEGATIVE for clinical alarms and STABLE for PI events/speed drops. No programming changes were made and pump is functioning within specified parameters. Pt is performing daily controller and system monitor self tests along with completing weekly and monthly maintenance for LVAD equipment.   LVAD equipment check completed and is in good working order. Back-up equipment present. Charged back up battery and performed self-test on equipment.    Exit Site Care: Existing VAD dressing removed and site care performed using sterile technique. Drive line exit site cleaned with Vashe x 2, allowed to dry, and silverlon dressing under Sorbaview dressing. Exit site healed and partially incorporated, the velour is significantly exposed at exit site. Does not tunnel. Scant blood tinged from trauma. Driveline has been pulled out approx 1 mm. No redness, tenderness, or rash noted. Drive line anchor re-applied. Pt denies fever or chills. Provided 8 weekly dressing kits and 10 anchors for home use.        Significant Events on VAD Support:    Device: n/a   BP & Labs:  MAP 110 - Doppler is reflecting modified systolic   Hgb 14.7- No S/S of bleeding. Specifically denies melena/BRBPR or nosebleeds.   LDH at 175 with established  baseline of 200-348. Denies tea-colored urine. No power elevations noted on interrogation.    Plan: No medication change Pick up Corlanor  today Return to clinic on Wednesday for Zio patch placement Return to clinic in 2 months for follow up with Dr Cherrie  Lauraine Ip RN VAD Coordinator  Office: [phone]  24/7 Pager: [phone]

## 2024-11-11 NOTE — Progress Notes (Signed)
 LVAD CLINIC NOTE  PCP: Vincente Shivers, NP HF Cardiologist: DB  HPI:  Joel York is a 38 y.o. male with HTN, ADD, asthma, hx drug abuse (now on suboxone  x5 yrs), and seizures. Admitted in 7/27 with acute systolic heart failure -> shock. EF 10%. 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   - LHC with normal coronaries. RHC 07/24: Low CO (Fick CO/CI 3.3/1.9) and PAPi 3.0 on milrinone  0.375 . Off NE - cMRI:  LV markedly dilated EF 10% RV 17% NICM  Admitted 09/29/23 with DL infection. Wound cx + staph aureus. Underwent I&D/wound vac change x3. ID saw, discharged home on 6 weeks of IV cefazolin  (end date 11/18/23).   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 Underwent repeat I&D. Wound culture which showed stenotrophomas, staph aureus, and entercoccus cloacea.   Presents to VAD clinic for follow up.  Doing well. Active without issues. Son yanked on DL accidentally recently. Remains on suppressive minocycline  and Levaquin . Denies orthopnea or PND. No fevers, chills or problems with driveline. No bleeding, melena or neuro symptoms. No VAD alarms. Taking all meds as prescribed. Continues to smoke a few cigarettes daily. Has been out of corlanor  for several days    LVAD Documentation    11/11/2024  Device Info  LVAD Type: Heartmate III  Date of Implant: 07/30/2023  Therapy Type: Destination Therapy      11/11/2024  Vitals  Heart Rate: 128 BPM  Automatic BP: 90/60  Doppler MAP: 110 mmHg  SpO2: 97 %    Last 3 Weights Weight Weight  11/11/2024 74.844 kg 165 lb  07/28/2024 69.854 kg 154 lb  05/21/2024 68.312 kg 150 lb 9.6 oz       11/11/2024  LVAD Paramaters  Speed: 5600 RPM  Flow: 4 LPM  PI: 5  Power: 4 Watts  Hematocrit: 45 %  Alarms: mult LV advisories- pt states that he doesnt change his batteries until they beep  Events: 10-20 daily  Last Speed Change Date: 09/30/2023  Last Ramp Echo Date: 09/30/2023  Last Right Heart Cath Date: 07/28/2023  Bleeding History: No  Type of  Dressing: Weekly  Annual Maintenance Date: 07/28/2024    Labs    Units 11/11/24 1418 11/10/24 0000 10/29/24 0000 09/17/24 0000 09/10/24 1055 08/12/24 0000 07/29/24 1503  INR  1.7* 2.3 2.3   < > 2.6*   < > 2.0*  LDH U/L 175  --   --   --  158  --  169  HGB g/dL 85.2  --   --   --  84.8  --  16.7  CREATININE mg/dL 8.92  --   --   --  8.98  --  1.02   < > = values in this interval not displayed.          Past Medical History:  Diagnosis Date   Acid reflux    ADHD (attention deficit hyperactivity disorder)    Allergy    Asthma    Back pain    LVAD (left ventricular assist device) present (HCC)    Seizures (HCC)    Resolved    Current Outpatient Medications  Medication Sig Dispense Refill   albuterol  (VENTOLIN  HFA) 108 (90 Base) MCG/ACT inhaler Inhale 2 puffs into the lungs every 6 (six) hours as needed for wheezing or shortness of breath. Shortness of breath 1 each 11   cetirizine  (ZYRTEC ) 10 MG tablet Take 1 tablet (10 mg total) by mouth at bedtime. 30 tablet 6   colchicine   0.6 MG tablet Take 1 tablet (0.6 mg total) by mouth daily. 30 tablet 6   digoxin  (LANOXIN ) 0.125 MG tablet Take 1 tablet (0.125 mg total) by mouth daily. 30 tablet 11   Fluocinolone  Acetonide Body 0.01 % OIL Apply to damp skin (affected areas) 2-3 x weekly, as needed. 120 mL 3   gabapentin  (NEURONTIN ) 300 MG capsule Take 1 capsule (300 mg total) by mouth 3 (three) times daily. 90 capsule 6   hydrOXYzine  (ATARAX ) 25 MG tablet Take 1 tablet (25 mg total) by mouth 3 (three) times daily as needed for itching. 30 tablet 0   levofloxacin  (LEVAQUIN ) 250 MG tablet Take 2 tablets (500 mg total) by mouth daily. 60 tablet 5   losartan  (COZAAR ) 25 MG tablet Take 1 tablet (25 mg total) by mouth 2 (two) times daily. 60 tablet 11   minocycline  (MINOCIN ) 100 MG capsule Take 2 capsules (200 mg total) by mouth 2 (two) times daily. 120 capsule 4   pantoprazole  (PROTONIX ) 40 MG tablet Take 1 tablet (40 mg total) by mouth  daily. 30 tablet 11   sertraline  (ZOLOFT ) 25 MG tablet Take 1 tablet (25 mg total) by mouth daily. 30 tablet 11   spironolactone  (ALDACTONE ) 25 MG tablet Take 1 tablet (25 mg total) by mouth daily. 30 tablet 11   SUBOXONE  8-2 MG FILM Place 1 Film under the tongue daily.     triamcinolone  cream (KENALOG ) 0.1 % Apply 1 Application topically 2 (two) times daily. 30 g 3   Varenicline  Tartrate, Starter, (CHANTIX  STARTING MONTH PAK) 0.5 MG X 11 & 1 MG X 42 TBPK Days 1 to 3: 0.5 mg once daily. Days 4 to 7: 0.5 mg twice daily. Maintenance (day 8 and later): 1 mg twice daily 90 each 3   warfarin (COUMADIN ) 5 MG tablet Take 5 mg every Tuesday and 7.5 mg all other days or as directed by HF clinic. 50 tablet 11   Zinc  Sulfate 220 (50 Zn) MG TABS Take 1 tablet (220 mg total) by mouth daily. 30 tablet 11   ivabradine  (CORLANOR ) 5 MG TABS tablet Take 1 tablet (5 mg total) by mouth 2 (two) times daily with a meal. 60 tablet 11   traZODone  (DESYREL ) 50 MG tablet Take 1 tablet (50 mg total) by mouth at bedtime as needed for sleep. (Patient not taking: Reported on 11/11/2024) 30 tablet 6   No current facility-administered medications for this encounter.    Physical Exam: General:  NAD.  HEENT: normal  Neck: supple. JVP not elevated.  Carotids 2+ bilat; no bruits. No lymphadenopathy or thryomegaly appreciated. Cor: LVAD hum.  Lungs: Clear. Abdomen: soft, nontender, non-distended. No hepatosplenomegaly. No bruits or masses. Good bowel sounds. Driveline site clean. Anchor in place.  Extremities: no cyanosis, clubbing, rash. Warm no edema  Neuro: alert & oriented x 3. No focal deficits. Moves all 4 without problem    ASSESSMENT AND PLAN:    1. Driveline infection/trauma - Readmitted in 11/24 for recurrent DL infection. Underwent repeat I&D. Wound culture which showed stenotrophomas, staph aureus, and entercoccus cloacea.  - On levaquin  + minocycline  for suppression.  - ID following  - Mild trauma this week  but site ok. No change   2. Chronic biventricular systolic heart failure - Admitted 7/24 NYHA IV symptoms .Echo EF <20%,  RV mildly reduced, - Etiology uncertain. ? 2/2 PVCs vs genetically mediated +/- hypertension.  - LHC with normal coronaries.  - cMRI:  LV markedly dilated EF 10% RV  17% NICM - Underwent HM-III VAD on 07/28/25 -Doing well with VAD. NYHA I - Volume ok. HR remains high  - Continue digoxin  0.125mg  daily, losartan  25mg  BID, spironolactone  25mg  daily - Resume ivabradine  5mg  BID - Not candidate for transplant referral given ongoing tobacco use. Encouraged cessationa gain today.   3. S/P HMIII LVAD  on 07/29/23 - VAD interrogated personally. Parameters stable. - MAPs ok - LDH 175 - Hgb 14.7 - INR 1.7 Goal 2.0-3.0 Discussed warfarin dosing with PharmD personally. - Off ASA - DL management as above   4. PVCs - Improved. Now off mexilitene - watch for recurrence   5. Tobacco use - still smoking a few cigarettes per day for stress relief - Discussed cessation again today. Has prescription for Chantix    6. Hx drug abuse - Continue suboxone  - no change  I spent a total of 36 minutes today: 1) reviewing the patient's medical records including previous charts, labs and recent notes from other providers; 2) examining the patient and counseling them on their medical issues/explaining the plan of care; 3) adjusting meds as needed and 4) ordering lab work or other needed tests.    Toribio Fuel, MD  3:34 PM

## 2024-11-17 ENCOUNTER — Other Ambulatory Visit (HOSPITAL_COMMUNITY): Payer: Self-pay | Admitting: Internal Medicine

## 2024-11-17 ENCOUNTER — Ambulatory Visit (HOSPITAL_COMMUNITY)
Admission: RE | Admit: 2024-11-17 | Discharge: 2024-11-17 | Disposition: A | Discharge status: Home / Self Care | Source: Ambulatory Visit | Attending: Cardiology | Admitting: Cardiology

## 2024-11-17 ENCOUNTER — Encounter: Payer: Self-pay | Admitting: Internal Medicine

## 2024-11-17 ENCOUNTER — Ambulatory Visit (HOSPITAL_COMMUNITY)
Admission: RE | Admit: 2024-11-17 | Discharge: 2024-11-17 | Disposition: A | Discharge status: Home / Self Care | Source: Ambulatory Visit | Attending: Internal Medicine | Admitting: Internal Medicine

## 2024-11-17 DIAGNOSIS — Z95811 Presence of heart assist device: Secondary | ICD-10-CM

## 2024-11-17 DIAGNOSIS — R000 Tachycardia, unspecified: Secondary | ICD-10-CM

## 2024-11-17 DIAGNOSIS — I493 Ventricular premature depolarization: Secondary | ICD-10-CM

## 2024-11-17 DIAGNOSIS — T827XXA Infection and inflammatory reaction due to other cardiac and vascular devices, implants and grafts, initial encounter: Secondary | ICD-10-CM

## 2024-11-17 MED ORDER — MINOCYCLINE HCL 100 MG PO CAPS: 200.0000 mg | ORAL_CAPSULE | Freq: Two times a day (BID) | ORAL | 0 refills | Status: DC

## 2024-11-17 MED ORDER — LEVOFLOXACIN 250 MG PO TABS: 500.0000 mg | ORAL_TABLET | Freq: Every day | ORAL | 0 refills | Status: DC

## 2024-11-17 NOTE — Addendum Note (Signed)
 Addended by: FLORENE BOUCHARD D on: 11/17/2024 10:45 AM   Modules accepted: Orders

## 2024-11-17 NOTE — Progress Notes (Signed)
 Zio patch placed onto patient.  All instructions and information reviewed with patient, they verbalize understanding with no questions. Per Dr. Bensimhon.

## 2024-11-17 NOTE — Telephone Encounter (Signed)
 Received voicemail from patient's wife requesting levofloxacin  refill. Per provider's note, patient should also be on minocycline .   Duwaine Lowe, BSN, RN

## 2024-11-23 ENCOUNTER — Ambulatory Visit (HOSPITAL_COMMUNITY): Payer: Self-pay | Admitting: Pharmacist

## 2024-11-23 LAB — POCT INR: INR: 2.1 (ref 2.0–3.0)

## 2024-11-29 ENCOUNTER — Ambulatory Visit: Admitting: Internal Medicine

## 2024-11-30 ENCOUNTER — Other Ambulatory Visit: Payer: Self-pay

## 2024-11-30 ENCOUNTER — Telehealth: Admitting: Internal Medicine

## 2024-11-30 DIAGNOSIS — T827XXA Infection and inflammatory reaction due to other cardiac and vascular devices, implants and grafts, initial encounter: Secondary | ICD-10-CM

## 2024-11-30 DIAGNOSIS — Z95811 Presence of heart assist device: Secondary | ICD-10-CM | POA: Diagnosis not present

## 2024-11-30 DIAGNOSIS — T827XXD Infection and inflammatory reaction due to other cardiac and vascular devices, implants and grafts, subsequent encounter: Secondary | ICD-10-CM | POA: Diagnosis not present

## 2024-11-30 DIAGNOSIS — Z79899 Other long term (current) drug therapy: Secondary | ICD-10-CM | POA: Diagnosis not present

## 2024-11-30 MED ORDER — MINOCYCLINE HCL 100 MG PO CAPS: 200.0000 mg | ORAL_CAPSULE | Freq: Two times a day (BID) | ORAL | 5 refills | Status: AC

## 2024-11-30 MED ORDER — LEVOFLOXACIN 250 MG PO TABS: 500.0000 mg | ORAL_TABLET | Freq: Every day | ORAL | 5 refills | Status: AC

## 2024-11-30 NOTE — Progress Notes (Unsigned)
 Virtual Visit via Telephone/Video Note   I connected with Joel York   On 11/30/2024 at 2:32 PM  by Video and verified that I am speaking with the correct person using two identifiers.   I discussed the limitations, risks, security and privacy concerns of performing an evaluation and management service by telephone and the availability of in person appointments. I also discussed with the patient that there may be a patient responsible charge related to this service. The patient expressed understanding and agreed to proceed.   Location:   Patient: Home Provider: [facility]    Patient: Joel York  DOB: 1986/03/22 MRN: [id number] PCP: Vincente Shivers, NP    Patient Active Problem List   Diagnosis Date Noted   LVAD (left ventricular assist device) present (HCC) 12/23/2023   Annual visit for general adult medical examination without abnormal findings 12/22/2023   Establishing care with new doctor, encounter for 12/22/2023   Medication management 11/28/2023   PICC (peripherally inserted central catheter) in place 11/28/2023   MSSA (methicillin susceptible Staphylococcus aureus) infection 10/02/2023   Staphylococcus aureus infection 09/30/2023   Infection associated with driveline of left ventricular assist device (LVAD) 09/29/2023   AKI (acute kidney injury) 07/14/2023   Acute on chronic systolic CHF (congestive heart failure) (HCC) 07/14/2023   Elevated troponin 07/14/2023   Sinus tachycardia 07/14/2023   PVC (premature ventricular contraction) 07/14/2023   Acute decompensated heart failure (HCC) 07/14/2023   History of asthma 07/14/2023   Myocardial injury 07/14/2023   Polycythemia 07/14/2023   Chest pain 07/14/2023   Transaminitis 07/14/2023   Elevated bilirubin 07/14/2023   CHF exacerbation (HCC) 07/14/2023   Opioid dependence in remission (HCC) 05/30/2017   ADD (attention deficit disorder) 08/29/2015   Visit for preventive health examination 02/21/2015   Mild  persistent asthma 02/14/2015   Attention deficit disorder 01/24/2011   ANXIETY STATE, UNSPECIFIED 11/06/2009   BICEPS TENDINITIS, LEFT 11/22/2008   ASTHMA 09/26/2008     Subjective:  38 Y O male with HTN, asthma, drug abuse, seizures, CHF s/p HMIII lVAD 07/29/23 with h/o MSSA lvad infeetion in Oct 2024 SP I&D x 3 discharged on 6 weeks of IV cefazolin ,  EOT 11/18/23 , then hospitalized for driveline infection SP I&D with Cx + MSSA, Enterobacter cloacae, Stenotrophomonas maltophilia, E faecalis on cefazolin , PO lqn and minocylineand then plan to transition to PO regimen for suppression presents for hospital F/U   Today 12/27 : Discussed the use of AI scribe software for clinical note transcription with the patient, who gave verbal consent to proceed. Tolerating abx. . The patient is also on the path to a transplant, which is expected to take six to eight months.   The patient is also being seen by CTS , who has been swabbing the wound for cultures. The patient reports that the wound is healing well and there have been no new concerns.  Interim: cefaxolin stopped. At 1/22 visit with CTS, noted increased redness c/f corrolation with stopping iVabx . I discussed that I suspect likely anew organism as mino should cover mssa. Rx trial of cefadroxil  1 gm bid x 7 days 01/19/24: Reprots redness /wound around exit site is unchanged and not correlated with start/end of IV abx. No fevers or chills. His wife changes. Notes dry skin on eyelids and right shoulder. He notes he has a history of dry skin(knuckles) but more noticable now Todeay : doing well no new complaints. Tolerating antibiotics.  Review of Systems  All other systems reviewed and are  negative.   Past Medical History:  Diagnosis Date   Acid reflux    ADHD (attention deficit hyperactivity disorder)    Allergy    Asthma    Back pain    LVAD (left ventricular assist device) present (HCC)    Seizures (HCC)    Resolved    Outpatient  Medications Prior to Visit  Medication Sig Dispense Refill   albuterol  (VENTOLIN  HFA) 108 (90 Base) MCG/ACT inhaler Inhale 2 puffs into the lungs every 6 (six) hours as needed for wheezing or shortness of breath. Shortness of breath 1 each 11   cetirizine  (ZYRTEC ) 10 MG tablet Take 1 tablet (10 mg total) by mouth at bedtime. 30 tablet 6   colchicine  0.6 MG tablet Take 1 tablet (0.6 mg total) by mouth daily. 30 tablet 6   digoxin  (LANOXIN ) 0.125 MG tablet Take 1 tablet (0.125 mg total) by mouth daily. 30 tablet 11   Fluocinolone  Acetonide Body 0.01 % OIL Apply to damp skin (affected areas) 2-3 x weekly, as needed. 120 mL 3   gabapentin  (NEURONTIN ) 300 MG capsule Take 1 capsule (300 mg total) by mouth 3 (three) times daily. 90 capsule 6   hydrOXYzine  (ATARAX ) 25 MG tablet Take 1 tablet (25 mg total) by mouth 3 (three) times daily as needed for itching. 30 tablet 0   ivabradine  (CORLANOR ) 5 MG TABS tablet Take 1 tablet (5 mg total) by mouth 2 (two) times daily with a meal. 60 tablet 11   levofloxacin  (LEVAQUIN ) 250 MG tablet Take 2 tablets (500 mg total) by mouth daily. 60 tablet 0   losartan  (COZAAR ) 25 MG tablet Take 1 tablet (25 mg total) by mouth 2 (two) times daily. 60 tablet 11   minocycline  (MINOCIN ) 100 MG capsule Take 2 capsules (200 mg total) by mouth 2 (two) times daily. 120 capsule 0   pantoprazole  (PROTONIX ) 40 MG tablet Take 1 tablet (40 mg total) by mouth daily. 30 tablet 11   sertraline  (ZOLOFT ) 25 MG tablet Take 1 tablet (25 mg total) by mouth daily. 30 tablet 11   spironolactone  (ALDACTONE ) 25 MG tablet Take 1 tablet (25 mg total) by mouth daily. 30 tablet 11   SUBOXONE  8-2 MG FILM Place 1 Film under the tongue daily.     traZODone  (DESYREL ) 50 MG tablet Take 1 tablet (50 mg total) by mouth at bedtime as needed for sleep. (Patient not taking: Reported on 11/11/2024) 30 tablet 6   triamcinolone  cream (KENALOG ) 0.1 % Apply 1 Application topically 2 (two) times daily. 30 g 3    Varenicline  Tartrate, Starter, (CHANTIX  STARTING MONTH PAK) 0.5 MG X 11 & 1 MG X 42 TBPK Days 1 to 3: 0.5 mg once daily. Days 4 to 7: 0.5 mg twice daily. Maintenance (day 8 and later): 1 mg twice daily 90 each 3   warfarin (COUMADIN ) 5 MG tablet Take 5 mg every Tuesday and 7.5 mg all other days or as directed by HF clinic. 50 tablet 11   Zinc  Sulfate 220 (50 Zn) MG TABS Take 1 tablet (220 mg total) by mouth daily. 30 tablet 11   No facility-administered medications prior to visit.     Allergies  Allergen Reactions   Chlorhexidine  Dermatitis and Rash   Other Anaphylaxis    Tree Nuts   Peanuts [Peanut Oil] Anaphylaxis    Social History   Tobacco Use   Smoking status: Some Days    Types: Cigarettes   Smokeless tobacco: Former    Types: Sports Administrator  Vaping Use   Vaping status: Former  Substance Use Topics   Alcohol use: Not Currently   Drug use: Not Currently    Comment: last use many years ago    Family History  Problem Relation Age of Onset   Hypertension Mother        Living   Crohn's disease Maternal Grandmother    Congestive Heart Failure Maternal Grandmother    Crohn's disease Maternal Uncle    Hypertension Other        Maternal Aunts & Uncles    Objective:    Lab Results: Lab Results  Component Value Date   WBC 5.7 11/11/2024   HGB 14.7 11/11/2024   HCT 45.6 11/11/2024   MCV 83.5 11/11/2024   PLT 218 11/11/2024    Lab Results  Component Value Date   CREATININE 1.07 11/11/2024   BUN 12 11/11/2024   NA 139 11/11/2024   K 4.3 11/11/2024   CL 103 11/11/2024   CO2 25 11/11/2024    Lab Results  Component Value Date   ALT 14 11/11/2024   AST 23 11/11/2024   ALKPHOS 97 11/11/2024   BILITOT 0.9 11/11/2024     Assessment & Plan:  #Driveline Infection, polymicrobial #HX of MSSA dirveline infection -SP I&D with OR Cx+ MSSA, enterobacter clacae, steno, E faecalis -Completed IV cefazolin  on 12/24/2023. -On suppressive oral Levaquin  500mg   and minocycline .   -Pt was seen by CTS last week. Concern for increased erythema at driveline site. Reached out to ID, I added cefadroxil  x 7 days. Pt states, his wife changes his bandages. The erythema is unchanged at has been present piro to abx being stopped. The suppresive regimen covers organisms isolated -11/20 labs stable -Seen by Cardiology(De Bensimhon) and note wound looks ok -continue suppressive levaquin  and minocycline  -F/U in 6 months       #Midcaiton monitoring -Labs reviewed       #Transplant Planning  -Continue current management and follow-up with transplant team.     Loney Stank, MD [facility]   11/30/24  2:32 PM I personally spent a total of 42 minutes in the care of the patient today including preparing to see the patient, getting/reviewing separately obtained history, counseling and educating, placing orders, documenting clinical information in the EHR, independently interpreting results, and communicating results.

## 2024-12-08 ENCOUNTER — Ambulatory Visit: Payer: Self-pay | Admitting: Pharmacist

## 2024-12-08 LAB — POCT INR: INR: 2.1 (ref 2.0–3.0)

## 2024-12-21 ENCOUNTER — Ambulatory Visit (HOSPITAL_COMMUNITY): Payer: Self-pay | Admitting: Pharmacist

## 2024-12-21 LAB — POCT INR: INR: 2.3 (ref 2.0–3.0)

## 2024-12-30 ENCOUNTER — Ambulatory Visit (HOSPITAL_COMMUNITY): Payer: Self-pay | Admitting: Pharmacist

## 2024-12-30 LAB — POCT INR: INR: 2.1 (ref 2.0–3.0)

## 2025-01-04 ENCOUNTER — Ambulatory Visit: Admitting: Physician Assistant

## 2025-01-04 ENCOUNTER — Ambulatory Visit (HOSPITAL_COMMUNITY): Admitting: Cardiology

## 2025-01-11 ENCOUNTER — Ambulatory Visit (HOSPITAL_COMMUNITY): Payer: Self-pay | Admitting: Pharmacist

## 2025-01-11 LAB — POCT INR: INR: 2 (ref 2.0–3.0)

## 2025-01-13 ENCOUNTER — Telehealth (HOSPITAL_COMMUNITY): Payer: Self-pay | Admitting: Unknown Physician Specialty

## 2025-01-13 NOTE — Telephone Encounter (Signed)

## 2025-01-19 ENCOUNTER — Other Ambulatory Visit (HOSPITAL_COMMUNITY): Payer: Self-pay | Admitting: Unknown Physician Specialty

## 2025-01-19 DIAGNOSIS — Z7901 Long term (current) use of anticoagulants: Secondary | ICD-10-CM

## 2025-01-19 DIAGNOSIS — Z95811 Presence of heart assist device: Secondary | ICD-10-CM

## 2025-01-20 ENCOUNTER — Ambulatory Visit (HOSPITAL_COMMUNITY): Payer: Self-pay | Admitting: Pharmacist

## 2025-01-20 ENCOUNTER — Ambulatory Visit (HOSPITAL_COMMUNITY): Admitting: Internal Medicine

## 2025-01-20 LAB — POCT INR: INR: 2.3 (ref 2.0–3.0)

## 2025-01-28 ENCOUNTER — Ambulatory Visit (HOSPITAL_COMMUNITY): Payer: Self-pay | Admitting: Pharmacist

## 2025-01-28 ENCOUNTER — Ambulatory Visit (HOSPITAL_COMMUNITY): Admission: RE | Admit: 2025-01-28 | Source: Ambulatory Visit | Admitting: Cardiology

## 2025-01-28 VITALS — BP 92/80 | HR 129 | Ht 67.0 in | Wt 158.0 lb

## 2025-01-28 DIAGNOSIS — Z7901 Long term (current) use of anticoagulants: Secondary | ICD-10-CM

## 2025-01-28 DIAGNOSIS — I509 Heart failure, unspecified: Secondary | ICD-10-CM

## 2025-01-28 DIAGNOSIS — Z95811 Presence of heart assist device: Secondary | ICD-10-CM

## 2025-01-28 LAB — PROTIME-INR
INR: 1.4 — ABNORMAL HIGH (ref 0.8–1.2)
Prothrombin Time: 17.9 s — ABNORMAL HIGH (ref 11.4–15.2)

## 2025-01-28 LAB — COMPREHENSIVE METABOLIC PANEL WITH GFR
ALT: 13 U/L (ref 0–44)
AST: 28 U/L (ref 15–41)
Albumin: 4.3 g/dL (ref 3.5–5.0)
Alkaline Phosphatase: 127 U/L — ABNORMAL HIGH (ref 38–126)
Anion gap: 10 (ref 5–15)
BUN: 16 mg/dL (ref 6–20)
CO2: 30 mmol/L (ref 22–32)
Calcium: 9.1 mg/dL (ref 8.9–10.3)
Chloride: 101 mmol/L (ref 98–111)
Creatinine, Ser: 1.19 mg/dL (ref 0.61–1.24)
GFR, Estimated: >60 mL/min
Glucose, Bld: 90 mg/dL (ref 70–99)
Potassium: 4 mmol/L (ref 3.5–5.1)
Sodium: 141 mmol/L (ref 135–145)
Total Bilirubin: 0.7 mg/dL (ref 0.0–1.2)
Total Protein: 7.1 g/dL (ref 6.5–8.1)

## 2025-01-28 LAB — CBC
HCT: 52.5 % — ABNORMAL HIGH (ref 39.0–52.0)
Hemoglobin: 17.6 g/dL — ABNORMAL HIGH (ref 13.0–17.0)
MCH: 27.4 pg (ref 26.0–34.0)
MCHC: 33.5 g/dL (ref 30.0–36.0)
MCV: 81.6 fL (ref 80.0–100.0)
Platelets: 249 10*3/uL (ref 150–400)
RBC: 6.43 MIL/uL — ABNORMAL HIGH (ref 4.22–5.81)
RDW: 17.9 % — ABNORMAL HIGH (ref 11.5–15.5)
WBC: 6 10*3/uL (ref 4.0–10.5)
nRBC: 0 % (ref 0.0–0.2)

## 2025-01-28 LAB — LACTATE DEHYDROGENASE: LDH: 289 U/L — ABNORMAL HIGH (ref 105–235)

## 2025-01-28 LAB — C-REACTIVE PROTEIN: CRP: <0.5 mg/dL

## 2025-01-28 LAB — PREALBUMIN: Prealbumin: 22 mg/dL (ref 18–38)

## 2025-01-28 LAB — SEDIMENTATION RATE: Sed Rate: 2 mm/h (ref 0–16)

## 2025-01-28 NOTE — Progress Notes (Signed)
 Patient presents for his 2 month follow up w/1.5 yr intermacs in VAD Clinic today with his 2 children. Reports no problems with VAD equipment or concerns with drive line.  Pt tells me that he has been doing well. Denies lightheadedness, dizziness, falls, shortness of breath, and signs of bleeding.  VAD interrogation reveals multiple low voltage advisory and low voltage hazard alarms. Pt admits he is sleeping on his batteries and tells me that he waits to change his batteries until they beep low voltage. Advised to sleep on MPU to avoid total battery depletion/accidental pump stop while sleeping. He verbalized understanding.   Pts wife Salbador continues to do his driveline dressing change every 2 - 3 times a week. Reports minimal drainage. Changed in clinic today.   Pt is currently on chronic suppressive Minocycline  and Levaquin . Pt confirms that he is taking these meds.  He continues to follow with ID. Using Kenalog  cream PRN.   Pt continues to smoke 1-2 cigs a day. Pt states that by his next visit he will be ready for a nicotine  screen in prep for transplant evaluation.    Vital Signs:  Doppler Pressure: 102   Automatc BP: 92/80 (86) HR: 129 SPO2: 99% RA   Weight: 158 lb w/ eqt Last weight: 165 lb w/ eqt  VAD Indication: Destination Therapy due to recent smoking    VAD interrogation & Equipment Management: Speed: 5600 Flow: 4 Power: 4.5 w    PI: 5   Alarms: multiple low voltage advisory/hazard alarms Events: 20 daily  Fixed speed 5600 Low speed limit: 5300   Primary Controller:  Replace back up battery in 9 months. Back up controller:   Replace back up battery in 14 months.   Annual Equipment Maintenance on UBC/PM was performed on 07/28/24.    I reviewed the LVAD parameters from today and compared the results to the patient's prior recorded data. LVAD interrogation was NEGATIVE for significant power changes, NEGATIVE for clinical alarms and STABLE for PI events/speed drops. No  programming changes were made and pump is functioning within specified parameters. Pt is performing daily controller and system monitor self tests along with completing weekly and monthly maintenance for LVAD equipment.   LVAD equipment check completed and is in good working order. Back-up equipment present. Charged back up battery and performed self-test on equipment.    Exit Site Care: Existing VAD dressing removed and site care performed using sterile technique. Drive line exit site cleaned with Vashe x 2, allowed to dry, and silverlon dressing under Sorbaview dressing. Exit site healed and partially incorporated, the velour is significantly exposed at exit site. Does not tunnel. Scant serous drainage. Proud flesh cauterized with silver nitrate today. No redness, tenderness, or rash noted. Drive line anchor re-applied. Pt has small hernia at driveline exit site. Pt denies fever or chills. Provided 16 weekly dressing kits and 10 anchors for home use.        Significant Events on VAD Support:    Device: n/a   BP & Labs:  MAP 102 - Doppler is reflecting modified systolic   Hgb 17.6- No S/S of bleeding. Specifically denies melena/BRBPR or nosebleeds.   LDH at 289 with established baseline of 200-348. Denies tea-colored urine. No power elevations noted on interrogation.   1.5 year Intermacs follow up completed including:  Quality of Life, KCCQ-12, and Neurocognitive trail making.   Pt completed 1650 feet during 6 minute walk.  [place] Cardiomyopathy Questionnaire     01/28/2025  12:50 PM 07/28/2024    1:09 PM 02/11/2024    1:10 PM  KCCQ-12  1 a. Ability to shower/bathe Quite a bit limited Extremely limited Other, Did not do  1 b. Ability to walk 1 block Moderately limited Moderately limited Not at all limited  1 c. Ability to hurry/jog Quite a bit limited Quite a bit limited Moderately limited  2. Edema feet/ankles/legs Never over the past 2 weeks Less than once a week Never  over the past 2 weeks  3. Limited by fatigue 1-2 times a week Several times a day 1-2 times a week  4. Limited by dyspnea Never over the past 2 weeks 1-2 times a week Less than once a week  5. Sitting up / on 3+ pillows Never over the past 2 weeks Never over the past 2 weeks Never over the past 2 weeks  6. Limited enjoyment of life Moderately limited Limited quite a bit Not limited at all  7. Rest of life w/ symptoms Not at all satisfied Not at all satisfied Somewhat satisfied  8 a. Participation in hobbies Limited quite a bit Severely limited Limited quite a bit  8 b. Participation in chores Severely limited Severely limited Limited quite a bit  8 c. Visiting family/friends Moderately limited Limited quite a bit Slightly limited     Plan: No medication changes Return to clinic in 2 months with echo and nicotine  screen   Lauraine Ip RN VAD Coordinator  Office: [phone]  24/7 Pager: [phone]

## 2025-01-28 NOTE — Patient Instructions (Signed)
 No change in medications Return to clinic in 2 months with echo

## 2025-03-23 ENCOUNTER — Ambulatory Visit: Admitting: Physician Assistant

## 2025-04-04 ENCOUNTER — Ambulatory Visit (HOSPITAL_COMMUNITY): Admitting: Internal Medicine

## 2025-04-08 ENCOUNTER — Other Ambulatory Visit (HOSPITAL_COMMUNITY)

## 2025-04-08 ENCOUNTER — Ambulatory Visit (HOSPITAL_COMMUNITY): Admitting: Internal Medicine

## 2025-05-31 ENCOUNTER — Ambulatory Visit: Admitting: Internal Medicine
